# Patient Record
Sex: Male | Born: 1945 | Race: White | Hispanic: No | Smoking: Current some day smoker
Health system: Southern US, Community
[De-identification: ages and names within clinical notes are randomized; demographics above are authoritative.]

## PROBLEM LIST (undated history)

## (undated) DIAGNOSIS — E119 Type 2 diabetes mellitus without complications: Secondary | ICD-10-CM

## (undated) DIAGNOSIS — F1010 Alcohol abuse, uncomplicated: Secondary | ICD-10-CM

## (undated) DIAGNOSIS — F32A Depression, unspecified: Secondary | ICD-10-CM

## (undated) DIAGNOSIS — R0600 Dyspnea, unspecified: Secondary | ICD-10-CM

## (undated) DIAGNOSIS — F419 Anxiety disorder, unspecified: Secondary | ICD-10-CM

## (undated) DIAGNOSIS — C801 Malignant (primary) neoplasm, unspecified: Secondary | ICD-10-CM

## (undated) DIAGNOSIS — E785 Hyperlipidemia, unspecified: Secondary | ICD-10-CM

## (undated) DIAGNOSIS — F329 Major depressive disorder, single episode, unspecified: Secondary | ICD-10-CM

## (undated) DIAGNOSIS — I10 Essential (primary) hypertension: Secondary | ICD-10-CM

## (undated) DIAGNOSIS — C4490 Unspecified malignant neoplasm of skin, unspecified: Secondary | ICD-10-CM

## (undated) HISTORY — DX: Hyperlipidemia, unspecified: E78.5

## (undated) HISTORY — PX: CATARACT EXTRACTION: SUR2

## (undated) HISTORY — DX: Unspecified malignant neoplasm of skin, unspecified: C44.90

## (undated) HISTORY — DX: Essential (primary) hypertension: I10

## (undated) HISTORY — DX: Depression, unspecified: F32.A

## (undated) HISTORY — DX: Anxiety disorder, unspecified: F41.9

## (undated) HISTORY — DX: Major depressive disorder, single episode, unspecified: F32.9

## (undated) HISTORY — PX: COLONOSCOPY: SHX174

## (undated) NOTE — *Deleted (*Deleted)
[facility]  [address], [address] [place], [place] [address] Phone: [phone]  Fax: [phone]   Clinic Day:  10/26/2020  Referring physician: Etheleen Nicks, NP  Chief Complaint: Kenneth Franklin is a 41 y.o. male with stage III nasopharyngeal carcinoma who is seen for assessment prior to week #7 cisplatin and concurrent radiation.  HPI: The patient was last seen in the oncology clinic on 10/20/2020. At that time, he had been having a lot of mouth pain and burning on the right side. He could not taste anything. He was drinking 4-5 Boosts or Ensures and 5 bottles of water per day. He denied any problems with his hearing. Shortness of breath was stable. He had rare headaches. Hematocrit was 34.8, hemoglobin 11.5, MCV 91.3, platelets 215,000, WBC 5,200. Sodium was 132. Chloride was 95. Magnesium was 1.4. He was prescribed magic mouthwash. He received week #6 cisplatin. He received potassium chloride 20 mEq and 2 g IV magnesium.  The patient began IMRT treatment on 09/08/2020. His last treatment is scheduled for 10/28/2020.  The patient went to the ER on 10/23/2020 for trouble swallowing. He was able to swallow liquids but not solids. He stated that magic mouthwash was not providing him much relief. He was given IV magnesium. He was prescribed viscous lidocaine.  The patient was admitted to [facility] on 10/25/2020 for generalized weakness and poor oral intake.  During the interim, ***   Past Medical History:  Diagnosis Date  . Acute ischemic stroke (HCC) 2017  . Alcohol abuse   . Anxiety   . Cancer (HCC)   . Depression   . Diabetes mellitus without complication (HCC)   . Dyspnea    pcp knows and ordered rescue inhaler  . Hyperlipidemia   . Hypertension   . Skin cancer    Squamous Cell Carcinoma In Situ    Past Surgical History:  Procedure Laterality Date  . CATARACT EXTRACTION Bilateral   . COLONOSCOPY    . NASOPHARYNGOSCOPY N/A 08/12/2020    Procedure: ENDOSCOPIC NASOPHARYNGOSCOPY WITH BIOPSY;  Surgeon: Vernie Murders, MD;  Location: [facility] ORS;  Service: ENT;  Laterality: N/A;  . PORTA CATH INSERTION N/A 08/24/2020   Procedure: PORTA CATH INSERTION;  Surgeon: Annice Needy, MD;  Location: [facility] INVASIVE CV LAB;  Service: Cardiovascular;  Laterality: N/A;    Family History  Problem Relation Age of Onset  . Diabetes Brother     Social History:  reports that he quit smoking about 14 months ago. His smoking use included cigars and cigarettes. He has a 40.00 pack-year smoking history. He has never used smokeless tobacco. He reports previous alcohol use. He reports that he does not use drugs. He smoked 1 pack/day x 40 years.  He denies exposure of radiation or toxins; he did work with asphalt. He lives alone in [place]. He is widowed. The patient is alone*** today.  Allergies:  Allergies  Allergen Reactions  . Propoxyphene     Unknown reaction    Current Medications: No current facility-administered medications for this visit.   No current outpatient medications on file.   Facility-Administered Medications Ordered in Other Visits  Medication Dose Route Frequency Provider Last Rate Last Admin  . 0.9 %  sodium chloride infusion   Intravenous Continuous Andris Baumann, MD 75 mL/hr at 10/25/20 1634 New Bag at 10/25/20 1634  . acetaminophen (TYLENOL) tablet 650 mg  650 mg Oral Q6H PRN Andris Baumann, MD       Or  .  acetaminophen (TYLENOL) suppository 650 mg  650 mg Rectal Q6H PRN Andris Baumann, MD      . acetaminophen (TYLENOL) tablet 1,000 mg  1,000 mg Oral Once [place], [place], MD      . ALPRAZolam Prudy Feeler) tablet 0.25 mg  0.25 mg Oral Daily PRN Pokhrel, Laxman, MD   0.25 mg at 10/25/20 1258  . aspirin EC tablet 81 mg  81 mg Oral Daily Pokhrel, Laxman, MD      . atorvastatin (LIPITOR) tablet 80 mg  80 mg Oral QPM Pokhrel, Laxman, MD      . bisacodyl (DULCOLAX) suppository 10 mg  10 mg Rectal Daily Pokhrel, Laxman, MD   10 mg  at 10/25/20 1258  . budesonide (PULMICORT) nebulizer solution 0.25 mg  0.25 mg Nebulization BID Pokhrel, Laxman, MD   0.25 mg at 10/26/20 0725  . busPIRone (BUSPAR) tablet 10 mg  10 mg Oral QHS Pokhrel, Laxman, MD   10 mg at 10/25/20 2144  . calcium carbonate (TUMS - dosed in mg elemental calcium) chewable tablet 200 mg of elemental calcium  1 tablet Oral TID WC Pokhrel, Laxman, MD      . clopidogrel (PLAVIX) tablet 75 mg  75 mg Oral Daily Pokhrel, Laxman, MD      . enoxaparin (LOVENOX) injection 40 mg  40 mg Subcutaneous Q24H Lindajo Royal V, MD   40 mg at 10/25/20 1258  . escitalopram (LEXAPRO) tablet 20 mg  20 mg Oral q morning - 10a Pokhrel, Laxman, MD      . HYDROcodone-acetaminophen (NORCO) 10-325 MG per tablet 0.5 tablet  0.5 tablet Oral Q4H PRN Pokhrel, Laxman, MD      . insulin aspart (novoLOG) injection 0-5 Units  0-5 Units Subcutaneous QHS Lindajo Royal V, MD      . insulin aspart (novoLOG) injection 0-9 Units  0-9 Units Subcutaneous TID WC Lindajo Royal V, MD      . lidocaine (XYLOCAINE) 2 % viscous mouth solution 15 mL  15 mL Mouth/Throat Q4H PRN Pokhrel, Laxman, MD   15 mL at 10/25/20 2059  . linagliptin (TRADJENTA) tablet 5 mg  5 mg Oral Daily Pokhrel, Laxman, MD      . losartan (COZAAR) tablet 100 mg  100 mg Oral q morning - 10a Pokhrel, Laxman, MD      . magnesium oxide (MAG-OX) tablet 400 mg  400 mg Oral Daily Pokhrel, Laxman, MD      . magnesium sulfate IVPB 2 g 50 mL  2 g Intravenous Once Pokhrel, Laxman, MD      . mirtazapine (REMERON) tablet 15 mg  15 mg Oral QHS Pokhrel, Laxman, MD   15 mg at 10/25/20 2145  . morphine 2 MG/ML injection 2 mg  2 mg Intravenous Q2H PRN Andris Baumann, MD   2 mg at 10/25/20 1250  . ondansetron (ZOFRAN) tablet 4 mg  4 mg Oral Q6H PRN Andris Baumann, MD       Or  . ondansetron [facility]) injection 4 mg  4 mg Intravenous Q6H PRN Andris Baumann, MD   4 mg at 10/25/20 1631  . pantoprazole (PROTONIX) EC tablet 20 mg  20 mg Oral BID Pokhrel, Laxman,  MD   20 mg at 10/25/20 2145  . polyethylene glycol (MIRALAX / GLYCOLAX) packet 17 g  17 g Oral Daily Pokhrel, Laxman, MD      . prochlorperazine (COMPAZINE) tablet 10 mg  10 mg Oral Q6H PRN Pokhrel, Laxman, MD      .  sodium fluoride (PREVIDENT 5000 PLUS) 1.1 % dental cream 1 application  1 application dental QHS Pokhrel, Laxman, MD        Review of Systems  Constitutional: Positive for weight loss (2 lbs). Negative for chills, diaphoresis, fever and malaise/fatigue.       Feels "alright."  HENT: Negative for congestion, ear discharge, ear pain, hearing loss, nosebleeds, sinus pain, sore throat and tinnitus.        No sense of taste. Pain and burning on right side of mouth. No problems with hearing.  Eyes: Negative for blurred vision.  Respiratory: Positive for shortness of breath (on exertion, stable). Negative for cough, hemoptysis and sputum production.   Cardiovascular: Negative.  Negative for chest pain, palpitations and leg swelling.  Gastrointestinal: Negative for abdominal pain, blood in stool, constipation, diarrhea, heartburn, melena, nausea and vomiting.       Drinks Boost/Ensure 4-5 x per day. Drinks 5 bottles of water per day.  Genitourinary: Negative.  Negative for dysuria, frequency, hematuria and urgency.       Urinates regularly throughout the day.  Musculoskeletal: Negative.  Negative for back pain, joint pain, myalgias and neck pain.  Skin: Negative for itching and rash.  Neurological: Positive for headaches (rare). Negative for dizziness, tingling, sensory change and weakness.  Endo/Heme/Allergies: Negative.  Does not bruise/bleed easily.  Psychiatric/Behavioral: Negative.  Negative for depression and memory loss. The patient is not nervous/anxious and does not have insomnia.   All other systems reviewed and are negative.  Performance status (ECOG): 1***  Vitals There were no vitals taken for this visit.   Physical Exam Vitals and nursing note reviewed.   Constitutional:      General: He is not in acute distress.    Appearance: Normal appearance. He is well-developed.     Interventions: Face mask in place.     Comments: Gentleman sitting comfortably in wheelchair in no acute distress. Able to get onto table for exam.  HENT:     Head: Normocephalic and atraumatic.     Comments: Cap. Gray hair.    Right Ear: Hearing normal.     Left Ear: Hearing normal.     Mouth/Throat:     Mouth: Mucous membranes are dry. No oral lesions.     Comments: Pinkness on hard palate (improved). Eyes:     General: No scleral icterus.    Extraocular Movements: Extraocular movements intact.     Conjunctiva/sclera: Conjunctivae normal.     Pupils: Pupils are equal, round, and reactive to light.     Comments: Eye irritation has resolved.  Cardiovascular:     Rate and Rhythm: Normal rate and regular rhythm.     Heart sounds: Normal heart sounds. No murmur heard.  No friction rub. No gallop.   Pulmonary:     Effort: Pulmonary effort is normal.     Breath sounds: Normal breath sounds. No wheezing, rhonchi or rales.  Abdominal:     General: Bowel sounds are normal. There is no distension.     Palpations: Abdomen is soft. There is no hepatomegaly, splenomegaly or mass.     Tenderness: There is no abdominal tenderness. There is no guarding or rebound.  Musculoskeletal:        General: No tenderness. Normal range of motion.     Cervical back: Normal range of motion and neck supple.  Lymphadenopathy:     Head:     Right side of head: No preauricular, posterior auricular or occipital adenopathy.  Left side of head: No preauricular, posterior auricular or occipital adenopathy.     Cervical: No cervical adenopathy.     Upper Body:     Right upper body: No supraclavicular or axillary adenopathy.     Left upper body: No supraclavicular or axillary adenopathy.     Lower Body: No right inguinal adenopathy. No left inguinal adenopathy.  Skin:    General: Skin is  warm and dry.     Findings: No bruising, erythema, lesion or rash.     Comments: Ruddy erythema on neck s/p radiation.  Neurological:     Mental Status: He is alert and oriented to person, place, and time.  Psychiatric:        Behavior: Behavior normal.        Thought Content: Thought content normal.        Judgment: Judgment normal.    No visits with results within 3 Day(s) from this visit.  Latest known visit with results is:  Admission on 08/12/2020, Discharged on 08/12/2020  Component Date Value Ref Range Status  . Sodium 08/12/2020 132* 135 - 145 mmol/L Final  . Potassium 08/12/2020 4.2  3.5 - 5.1 mmol/L Final  . Chloride 08/12/2020 96* 98 - 111 mmol/L Final  . CO2 08/12/2020 27  22 - 32 mmol/L Final  . Glucose, Bld 08/12/2020 374* 70 - 99 mg/dL Final   Glucose reference range applies only to samples taken after fasting for at least 8 hours.  . BUN 08/12/2020 7* 8 - 23 mg/dL Final  . Creatinine, Ser 08/12/2020 0.84  0.61 - 1.24 mg/dL Final  . Calcium 40/98/1191 8.5* 8.9 - 10.3 mg/dL Final  . GFR calc non Af Amer 08/12/2020 >60  >60 mL/min Final  . GFR calc Af Amer 08/12/2020 >60  >60 mL/min Final  . Anion gap 08/12/2020 9  5 - 15 Final   Performed at [facility], [address] [place], [place] [address]  . WBC 08/12/2020 5.5  4.0 - 10.5 K/uL Final  . RBC 08/12/2020 4.46  4.22 - 5.81 MIL/uL Final  . Hemoglobin 08/12/2020 13.2  13.0 - 17.0 g/dL Final  . HCT 56/21/3086 39.3  39 - 52 % Final  . MCV 08/12/2020 88.1  80.0 - 100.0 fL Final  . MCH 08/12/2020 29.6  26.0 - 34.0 pg Final  . MCHC 08/12/2020 33.6  30.0 - 36.0 g/dL Final  . RDW 57/84/6962 12.6  11.5 - 15.5 % Final  . Platelets 08/12/2020 178  150 - 400 K/uL Final  . nRBC 08/12/2020 0.0  0.0 - 0.2 % Final   Performed at [facility], [address], [place], [place] [address]  . Glucose-Capillary 08/12/2020 368* 70 - 99 mg/dL Final   Glucose reference range applies only to samples taken after  fasting for at least 8 hours.  . Glucose-Capillary 08/12/2020 333* 70 - 99 mg/dL Final   Glucose reference range applies only to samples taken after fasting for at least 8 hours.  . SURGICAL PATHOLOGY 08/12/2020    Final-Edited                   Value:SURGICAL PATHOLOGY CASE: [id number] PATIENT: Ernestine Conrad Surgical Pathology Report  Specimen Submitted: A. Nasopharyngeal tumor  Clinical History: Malignant neoplasm nasopharynx, sphenoid sinus.  DIAGNOSIS: A. NASOPHARYNGEAL TUMOR; BIOPSY: - KERATINIZING SQUAMOUS CELL CARCINOMA, MODERATELY DIFFERENTIATED.  GROSS DESCRIPTION: Intraoperative Consultation:     Labeled: Nasopharyngeal tumor     Received: Fresh  Specimen: Nasopharyngoscopy with biopsy     Pathologic evaluation performed: Frozen section diagnosis     Diagnosis: FSA, representative section: Squamous cell carcinoma     Communicated to: Called to Dr. Elenore Rota at 11:36 AM on 08/12/2020 Elijah Birk M.D.     Tissue submitted: A touch preparation with 1 Diff-Quik stained slide is performed.  Additionally, representative sections of the specimen are frozen as FSA1 and 3 frozen section slides are performed.  A. Labeled: Nasopharyngeal tumor Received: Fresh Tissue fragment(s): Multiple                          Size: Aggregate, 1.4 x 1.4 x 0.4 cm Description: Received fresh on a Telfa pad are fragments of pink soft tissue and blood clot.  A touch preparation with 1 Diff-Quik stained slide is performed.  Representative sections are frozen as FSA1 with 3 frozen section slides performed.  The frozen section remnant is submitted in cassette 1 and the remainder of the specimen is submitted in cassette 2.  Final Diagnosis performed by Elijah Birk, MD.   Electronically signed 08/13/2020 9:25:55AM The electronic signature indicates that the named Attending Pathologist has evaluated the specimen Technical component performed at [facility], [address] [place], [place] [address]  Lab: [id number] Dir: Jolene Schimke, MD, MMM  Professional component performed at [facility], [address] [place], [place], [place] [address] Lab: [phone] Dir: Georgiann Cocker. Rubinas, MD  . Glucose-Capillary 08/12/2020 275* 70 - 99 mg/dL Final   Glucose reference range applies only to samples taken after fasting for at least 8 hours.  . Glucose-Capillary 08/12/2020 246* 70 - 99 mg/dL Final   Glucose reference range applies only to samples taken after fasting for at least 8 hours.    Assessment:  AADYN BUCHHEIT is a 74 y.o. 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  Pathology revealed keratinizing squamous cell carcinoma, moderately differentiated.   Head MRI on 08/13/2020 revealed a 5.9 cm nasopharyngeal mass consistent with known nasopharyngeal carcinoma with involvement of the clivus and sphenoid sinus. There was moderate chronic small vessel ischemic disease and cerebral atrophy.  There was no intracranial extension.  PET scan on 08/18/2020 revealed a 5.7 x 4.2 cm destructive hypermetabolic mass (SUV 11.38) involving the sphenoid sinus, skull base and posterior nasopharynx.  This was consistent with patient's known squamous cell carcinoma. There was no locoregional lymphadenopathy or distant metastatic disease.  He began IMRT on 09/08/2020.  He is s/p 4 weeks of cisplatin (09/08/2020 - 09/29/2020; 10/13/2020).  He did not receive treatment on 10/06/2020 secondary to poor urine output.  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  Symptomatically, ***  Plan: 1.   Labs today: CBC with diff, CMP, Mg   2.   Clinical [id number] nasopharyngeal carcinoma  Tumor is unresectable.  PET scan revealed no evidence of distant disease  Treatment includes upfront radiation and chemotherapy followed by adjuvant chemotherapy   Cisplatin  40 mg/m2 weekly x 6-7 with radiation followed by cisplatin 80 mg/m2 D1 + 5FU 1000 mg/m2/day CI D1-4 q 28 days x 3 cycles  Baseline audiogram confirmed hearing loss associated with prior exposure.  Symptomatically, he has mucositis secondary to radiation.   He denies any nausea.  He is voiding well.  He is s/p 5 weeks of cisplatin with radiation.   Radiation ends on 10/28/2020  Labs reviewed.  Begin week #6 cisplatin and radiation  Discuss symptom management.  He has antiemetics and pain medications at home to use on a prn bases.  Interventions are adequate.    3.   Mucositis  Exam reveals mucositis involving hard palate.  Pain controlled with hydrocodone with Tylenol.   Refill hydrocodone-acetaminophen (Norco) 10-325 mg 1/2 tablet po q 4 hours as needed for pain.  Patient notes no relief with Carafate.  Rx: Magic mouthwash. 4.   Hypomagnesemia  Magnesium 1.4.  Magnesium 2 gm IV. 5.   Week #6 cisplatin today. 6.   Assess calcium content in Ensure/Boost. 7.   RTC in 1 week for MD assessment, labs (CBC with diff, CMP, Mg), and week #7 cisplatin.  I discussed the assessment and treatment plan with the patient.  The patient was provided an opportunity to ask questions and all were answered.  The patient agreed with the plan and demonstrated an understanding of the instructions.  The patient was advised to call back if the symptoms worsen or if the condition fails to improve as anticipated.  I provided *** minutes of face-to-face time during this this encounter and > 50% was spent counseling as documented under my assessment and plan.  Carman Essick C. Merlene Pulling, MD, PhD    10/26/2020, 8:50 AM  I, Danella Penton Tufford, am acting as Neurosurgeon for [employer]. Merlene Pulling, MD, PhD.  I, Mckale Haffey C. Merlene Pulling, MD, have reviewed the above documentation for accuracy and completeness, and I agree with the above.

---

## 1999-07-06 ENCOUNTER — Ambulatory Visit (HOSPITAL_COMMUNITY): Admission: RE | Admit: 1999-07-06 | Discharge: 1999-07-06 | Payer: Self-pay | Admitting: Neurological Surgery

## 1999-07-06 ENCOUNTER — Encounter: Payer: Self-pay | Admitting: Neurological Surgery

## 2006-12-26 ENCOUNTER — Ambulatory Visit: Payer: Self-pay | Admitting: Unknown Physician Specialty

## 2009-11-28 DEATH — deceased

## 2011-08-22 ENCOUNTER — Ambulatory Visit: Payer: Self-pay

## 2012-01-26 ENCOUNTER — Ambulatory Visit: Payer: Self-pay | Admitting: Orthopedic Surgery

## 2012-01-28 LAB — PATHOLOGY REPORT

## 2013-05-15 ENCOUNTER — Ambulatory Visit: Payer: Self-pay | Admitting: Family Medicine

## 2014-01-06 DIAGNOSIS — F341 Dysthymic disorder: Secondary | ICD-10-CM | POA: Diagnosis not present

## 2014-03-10 DIAGNOSIS — E7800 Pure hypercholesterolemia, unspecified: Secondary | ICD-10-CM | POA: Diagnosis not present

## 2014-03-10 DIAGNOSIS — E119 Type 2 diabetes mellitus without complications: Secondary | ICD-10-CM | POA: Diagnosis not present

## 2014-03-10 DIAGNOSIS — I10 Essential (primary) hypertension: Secondary | ICD-10-CM | POA: Diagnosis not present

## 2014-03-10 DIAGNOSIS — F341 Dysthymic disorder: Secondary | ICD-10-CM | POA: Diagnosis not present

## 2014-03-20 DIAGNOSIS — E119 Type 2 diabetes mellitus without complications: Secondary | ICD-10-CM | POA: Diagnosis not present

## 2014-03-20 DIAGNOSIS — F341 Dysthymic disorder: Secondary | ICD-10-CM | POA: Diagnosis not present

## 2014-03-20 DIAGNOSIS — F4320 Adjustment disorder, unspecified: Secondary | ICD-10-CM | POA: Diagnosis not present

## 2014-10-16 DIAGNOSIS — I10 Essential (primary) hypertension: Secondary | ICD-10-CM | POA: Diagnosis not present

## 2014-10-16 DIAGNOSIS — E780 Pure hypercholesterolemia: Secondary | ICD-10-CM | POA: Diagnosis not present

## 2014-10-16 DIAGNOSIS — F418 Other specified anxiety disorders: Secondary | ICD-10-CM | POA: Diagnosis not present

## 2015-01-13 ENCOUNTER — Ambulatory Visit: Payer: Self-pay | Admitting: Family Medicine

## 2015-01-13 DIAGNOSIS — I10 Essential (primary) hypertension: Secondary | ICD-10-CM | POA: Diagnosis not present

## 2015-01-13 DIAGNOSIS — R918 Other nonspecific abnormal finding of lung field: Secondary | ICD-10-CM | POA: Diagnosis not present

## 2015-01-13 DIAGNOSIS — F17200 Nicotine dependence, unspecified, uncomplicated: Secondary | ICD-10-CM | POA: Diagnosis not present

## 2015-01-16 DIAGNOSIS — F418 Other specified anxiety disorders: Secondary | ICD-10-CM | POA: Diagnosis not present

## 2015-01-16 DIAGNOSIS — Z125 Encounter for screening for malignant neoplasm of prostate: Secondary | ICD-10-CM | POA: Diagnosis not present

## 2015-01-16 DIAGNOSIS — H9190 Unspecified hearing loss, unspecified ear: Secondary | ICD-10-CM | POA: Diagnosis not present

## 2015-01-16 DIAGNOSIS — E785 Hyperlipidemia, unspecified: Secondary | ICD-10-CM | POA: Diagnosis not present

## 2015-01-16 DIAGNOSIS — E119 Type 2 diabetes mellitus without complications: Secondary | ICD-10-CM | POA: Diagnosis not present

## 2015-01-16 DIAGNOSIS — K219 Gastro-esophageal reflux disease without esophagitis: Secondary | ICD-10-CM | POA: Diagnosis not present

## 2015-01-16 LAB — LIPID PANEL
Cholesterol: 172 mg/dL (ref 0–200)
HDL: 52 mg/dL (ref 35–70)
LDL Cholesterol: 107 mg/dL
Triglycerides: 66 mg/dL (ref 40–160)

## 2015-01-16 LAB — TSH: TSH: 2.26 u[IU]/mL (ref <=5.90)

## 2015-01-16 LAB — HEMOGLOBIN A1C: Hgb A1c MFr Bld: 6.2 % — AB (ref 4.0–6.0)

## 2015-01-30 DIAGNOSIS — E785 Hyperlipidemia, unspecified: Secondary | ICD-10-CM | POA: Diagnosis not present

## 2015-01-30 DIAGNOSIS — J029 Acute pharyngitis, unspecified: Secondary | ICD-10-CM | POA: Diagnosis not present

## 2015-01-30 DIAGNOSIS — I10 Essential (primary) hypertension: Secondary | ICD-10-CM | POA: Diagnosis not present

## 2015-01-30 DIAGNOSIS — F418 Other specified anxiety disorders: Secondary | ICD-10-CM | POA: Diagnosis not present

## 2015-03-22 NOTE — Op Note (Signed)
PATIENT NAME:  Kenneth Franklin, Kenneth Franklin MR#:  [id number] DATE OF BIRTH:  [birth year]  DATE OF PROCEDURE:  01/26/2012  PREOPERATIVE DIAGNOSIS: Mucous cyst, right index finger.   POSTOPERATIVE DIAGNOSIS: Mucous cyst, right index finger.   PROCEDURE PERFORMED: Excision mucous cyst, right index finger from the DIP joint   SURGEON: Laurene Footman, MD  ANESTHESIA: MAC with digital block.    DESCRIPTION OF PROCEDURE: Patient brought to the Operating Room and after adequate anesthesia was obtained, the right arm was prepped and draped in usual sterile fashion. After appropriate patient identification and timeout procedure were obtained, the base of the digit was infiltrated with a total of 20 mL of 0.5% Sensorcaine without epinephrine. After allowing this to set, a Penrose drain was placed around the base of the finger. The mucous cyst was elliptically excised. It was on the radial side of the index finger DIP joint dorsally. The incision extended slightly proximally to allow for a rotational flap that will allow for healing. The tendon was elevated and the DIP joint was exposed. There was some spur present and this was debrided using a small rongeur to try to prevent recurrence. At this point, the wound was thoroughly irrigated. The incision was closed with simple interrupted 5-0 nylon with complete closure obtained. The wound was then dressed with Xeroform, 2 x 2's and a finger roll. Patient was then sent to recovery room in stable condition. There were no complications.   SPECIMEN: Excised mucous cyst.   ESTIMATED BLOOD LOSS: Minimal.   ____________________________ Laurene Footman, MD mjm:cms D: 01/26/2012 16:16:00 ET T: 01/26/2012 16:57:58 ET JOB#: [id number] cc: Laurene Footman, MD, <Dictator> Laurene Footman MD ELECTRONICALLY SIGNED 01/26/2012 17:24

## 2015-04-02 DIAGNOSIS — F418 Other specified anxiety disorders: Secondary | ICD-10-CM | POA: Diagnosis not present

## 2015-04-21 DIAGNOSIS — K529 Noninfective gastroenteritis and colitis, unspecified: Secondary | ICD-10-CM | POA: Insufficient documentation

## 2015-04-21 DIAGNOSIS — I10 Essential (primary) hypertension: Secondary | ICD-10-CM | POA: Insufficient documentation

## 2015-04-21 DIAGNOSIS — M549 Dorsalgia, unspecified: Secondary | ICD-10-CM | POA: Insufficient documentation

## 2015-04-21 DIAGNOSIS — E785 Hyperlipidemia, unspecified: Secondary | ICD-10-CM | POA: Insufficient documentation

## 2015-04-21 DIAGNOSIS — F329 Major depressive disorder, single episode, unspecified: Secondary | ICD-10-CM | POA: Insufficient documentation

## 2015-04-21 DIAGNOSIS — E119 Type 2 diabetes mellitus without complications: Secondary | ICD-10-CM | POA: Insufficient documentation

## 2015-04-21 DIAGNOSIS — K219 Gastro-esophageal reflux disease without esophagitis: Secondary | ICD-10-CM | POA: Insufficient documentation

## 2015-04-21 DIAGNOSIS — F419 Anxiety disorder, unspecified: Secondary | ICD-10-CM

## 2015-04-30 ENCOUNTER — Ambulatory Visit: Payer: Medicare Other | Admitting: Family Medicine

## 2015-08-11 ENCOUNTER — Other Ambulatory Visit: Payer: Self-pay | Admitting: Family Medicine

## 2015-08-11 MED ORDER — LOSARTAN POTASSIUM 100 MG PO TABS: 100.0000 mg | ORAL_TABLET | Freq: Every day | ORAL | Status: DC

## 2015-08-11 MED ORDER — ESCITALOPRAM OXALATE 20 MG PO TABS: 20.0000 mg | ORAL_TABLET | Freq: Every day | ORAL | Status: DC

## 2015-08-11 MED ORDER — BUSPIRONE HCL 10 MG PO TABS: 10.0000 mg | ORAL_TABLET | Freq: Three times a day (TID) | ORAL | Status: DC

## 2015-08-11 MED ORDER — METFORMIN HCL 500 MG PO TABS: 500.0000 mg | ORAL_TABLET | Freq: Two times a day (BID) | ORAL | Status: DC

## 2015-08-11 MED ORDER — SIMVASTATIN 40 MG PO TABS: 40.0000 mg | ORAL_TABLET | Freq: Every day | ORAL | Status: DC

## 2015-08-11 MED ORDER — ALPRAZOLAM 0.25 MG PO TABS: 0.2500 mg | ORAL_TABLET | Freq: Two times a day (BID) | ORAL | Status: DC | PRN

## 2015-08-11 NOTE — Telephone Encounter (Signed)
Medciation has been refilled and sent to [facility]

## 2015-08-11 NOTE — Telephone Encounter (Signed)
Prescription is ready for pickup at office informed patient to bring photo ID

## 2015-08-12 ENCOUNTER — Ambulatory Visit: Payer: Medicare Other | Admitting: Family Medicine

## 2015-08-26 ENCOUNTER — Ambulatory Visit: Payer: Medicare Other | Admitting: Family Medicine

## 2015-09-10 ENCOUNTER — Other Ambulatory Visit: Payer: Self-pay | Admitting: Family Medicine

## 2015-09-14 ENCOUNTER — Ambulatory Visit (INDEPENDENT_AMBULATORY_CARE_PROVIDER_SITE_OTHER): Payer: BLUE CROSS/BLUE SHIELD | Admitting: Family Medicine

## 2015-09-14 ENCOUNTER — Encounter: Payer: Self-pay | Admitting: Family Medicine

## 2015-09-14 VITALS — BP 138/78 | HR 108 | Temp 98.1°F | Resp 20 | Ht 71.0 in | Wt 159.8 lb

## 2015-09-14 DIAGNOSIS — R0989 Other specified symptoms and signs involving the circulatory and respiratory systems: Secondary | ICD-10-CM

## 2015-09-14 DIAGNOSIS — E119 Type 2 diabetes mellitus without complications: Secondary | ICD-10-CM

## 2015-09-14 DIAGNOSIS — E785 Hyperlipidemia, unspecified: Secondary | ICD-10-CM | POA: Diagnosis not present

## 2015-09-14 DIAGNOSIS — F418 Other specified anxiety disorders: Secondary | ICD-10-CM | POA: Diagnosis not present

## 2015-09-14 DIAGNOSIS — F419 Anxiety disorder, unspecified: Secondary | ICD-10-CM

## 2015-09-14 DIAGNOSIS — F329 Major depressive disorder, single episode, unspecified: Secondary | ICD-10-CM

## 2015-09-14 DIAGNOSIS — I10 Essential (primary) hypertension: Secondary | ICD-10-CM | POA: Diagnosis not present

## 2015-09-14 DIAGNOSIS — F32A Depression, unspecified: Secondary | ICD-10-CM

## 2015-09-14 LAB — GLUCOSE, POCT (MANUAL RESULT ENTRY): POC GLUCOSE: 125 mg/dL — AB (ref 70–99)

## 2015-09-14 LAB — POCT GLYCOSYLATED HEMOGLOBIN (HGB A1C): Hemoglobin A1C: 5.6

## 2015-09-14 MED ORDER — SIMVASTATIN 40 MG PO TABS: 40.0000 mg | ORAL_TABLET | Freq: Every day | ORAL | Status: DC

## 2015-09-14 MED ORDER — ESCITALOPRAM OXALATE 20 MG PO TABS: 20.0000 mg | ORAL_TABLET | Freq: Every day | ORAL | Status: DC

## 2015-09-14 MED ORDER — METFORMIN HCL 500 MG PO TABS: 500.0000 mg | ORAL_TABLET | Freq: Every day | ORAL | Status: DC

## 2015-09-14 MED ORDER — BUSPIRONE HCL 10 MG PO TABS: 10.0000 mg | ORAL_TABLET | Freq: Two times a day (BID) | ORAL | Status: DC

## 2015-09-14 MED ORDER — LOSARTAN POTASSIUM 100 MG PO TABS: 100.0000 mg | ORAL_TABLET | Freq: Every day | ORAL | Status: DC

## 2015-09-14 NOTE — Progress Notes (Signed)
Name: Kenneth Franklin   MRN: [id number]    DOB: 04-Apr-1946   Date:09/14/2015       Progress Note  Subjective  Chief Complaint  Chief Complaint  Patient presents with  . Medication Refill  . Diabetes  . Hyperlipidemia  . Gastroesophageal Reflux    Diabetes He presents for his follow-up diabetic visit. He has type 2 diabetes mellitus. Pertinent negatives for hypoglycemia include no headaches. Pertinent negatives for diabetes include no chest pain. Pertinent negatives for diabetic complications include no CVA. Current diabetic treatment includes oral agent (monotherapy). (Does not check BG) An ACE inhibitor/angiotensin II receptor blocker is being taken. Eye exam is not current.  Hyperlipidemia This is a chronic problem. The problem is controlled. Pertinent negatives include no chest pain, leg pain, myalgias or shortness of breath. Current antihyperlipidemic treatment includes statins.  Anxiety Presents for follow-up visit. Symptoms include depressed mood, excessive worry, irritability and restlessness. Patient reports no chest pain, insomnia, palpitations or shortness of breath.   Past treatments include non-benzodiazephine anxiolytics and benzodiazephines.  Depression        This is a chronic problem.  Associated symptoms include irritable, restlessness, body aches and sad.  Associated symptoms include does not have insomnia, no myalgias and no headaches.     The symptoms are aggravated by family issues.  Past treatments include SSRIs - Selective serotonin reuptake inhibitors.  Past medical history includes anxiety.   Hypertension This is a chronic problem. The problem is controlled. Associated symptoms include anxiety. Pertinent negatives include no chest pain, headaches, malaise/fatigue, palpitations or shortness of breath. Past treatments include angiotensin blockers. There is no history of kidney disease, CAD/MI or CVA.   Past Medical History  Diagnosis Date  . Anxiety   .  Hyperlipidemia   . Hypertension   . Depression     Past Surgical History  Procedure Laterality Date  . Cataract extraction Bilateral     Family History  Problem Relation Age of Onset  . Diabetes Brother     Social History   Social History  . Marital Status: Single    Spouse Name: N/A  . Number of Children: N/A  . Years of Education: N/A   Occupational History  . Not on file.   Social History Main Topics  . Smoking status: Current Some Day Smoker    Types: Cigars  . Smokeless tobacco: Never Used  . Alcohol Use: No  . Drug Use: No  . Sexual Activity: No   Other Topics Concern  . Not on file   Social History Narrative    Current outpatient prescriptions:  .  ALPRAZolam (XANAX) 0.25 MG tablet, Take 1 tablet (0.25 mg total) by mouth 2 (two) times daily as needed for anxiety., Disp: 60 tablet, Rfl: 0 .  aspirin 81 MG tablet, Take 1 tablet by mouth daily., Disp: , Rfl:  .  BLOOD GLUCOSE MONITORING SUPPL, ONETOUCH ULTRA (Device) - Historical Medication Active, Disp: , Rfl:  .  busPIRone (BUSPAR) 10 MG tablet, Take 1 tablet (10 mg total) by mouth 3 (three) times daily., Disp: 90 tablet, Rfl: 0 .  Cholecalciferol 1000 UNITS capsule, Take by mouth., Disp: , Rfl:  .  escitalopram (LEXAPRO) 20 MG tablet, Take 1 tablet (20 mg total) by mouth daily., Disp: 30 tablet, Rfl: 0 .  losartan (COZAAR) 100 MG tablet, Take 1 tablet (100 mg total) by mouth daily., Disp: 30 tablet, Rfl: 0 .  metFORMIN (GLUCOPHAGE) 500 MG tablet, Take 1 tablet (500 mg total)  by mouth 2 (two) times daily with a meal., Disp: 60 tablet, Rfl: 0 .  ranitidine (ZANTAC) 300 MG tablet, Take by mouth., Disp: , Rfl:  .  simvastatin (ZOCOR) 40 MG tablet, Take 1 tablet (40 mg total) by mouth at bedtime., Disp: 30 tablet, Rfl: 0  Allergies  Allergen Reactions  . Propoxyphene    Review of Systems  Constitutional: Positive for irritability. Negative for malaise/fatigue.  Respiratory: Negative for shortness of breath.    Cardiovascular: Negative for chest pain and palpitations.  Musculoskeletal: Negative for myalgias.  Neurological: Negative for headaches.  Psychiatric/Behavioral: Positive for depression. The patient does not have insomnia.    Objective  Filed Vitals:   09/14/15 0815  BP: 138/78  Pulse: 108  Temp: 98.1 F (36.7 C)  TempSrc: Oral  Resp: 20  Height: 5\' 11"  (1.803 m)  Weight: 159 lb 12.8 oz (72.485 kg)  SpO2: 95%    Physical Exam  Constitutional: He is oriented to person, place, and time and well-developed, well-nourished, and in no distress. He is irritable.  HENT:  Head: Normocephalic and atraumatic.  Cardiovascular: Normal rate and regular rhythm.   No murmur heard. Pulmonary/Chest: Effort normal and breath sounds normal. He has no wheezes.  Abdominal: Soft. Bowel sounds are normal. There is no tenderness.  Neurological: He is alert and oriented to person, place, and time.  Psychiatric: Mood, memory, affect and judgment normal.  Nursing note and vitals reviewed.  Assessment & Plan  1. Type 2 diabetes mellitus without complication, without long-term current use of insulin (HCC) Change metformin from twice daily to once daily. A1c of 5.6%, consistent with well-controlled diabetes. Recheck in 3-4 months - metFORMIN (GLUCOPHAGE) 500 MG tablet; Take 1 tablet (500 mg total) by mouth daily with breakfast.  Dispense: 90 tablet; Refill: 0 - POCT HgB A1C - POCT Glucose (CBG)  2. Anxiety and depression Symptoms stable on present therapy. Patient takes alprazolam 0.25 mg twice daily as needed. - busPIRone (BUSPAR) 10 MG tablet; Take 1 tablet (10 mg total) by mouth 2 (two) times daily.  Dispense: 180 tablet; Refill: 0 - escitalopram (LEXAPRO) 20 MG tablet; Take 1 tablet (20 mg total) by mouth daily.  Dispense: 90 tablet; Refill: 0  3. Dyslipidemia  - Lipid Profile - Comprehensive Metabolic Panel (CMET) - simvastatin (ZOCOR) 40 MG tablet; Take 1 tablet (40 mg total) by mouth at  bedtime.  Dispense: 90 tablet; Refill: 0  4. Essential hypertension  - losartan (COZAAR) 100 MG tablet; Take 1 tablet (100 mg total) by mouth daily.  Dispense: 90 tablet; Refill: 0  5. Absent pulse in lower extremity Could not palpate dorsalis pedis and posterior tibial pulses during diabetic foot exam. Patient to be referred to vascular surgery. - Ambulatory referral to Vascular Surgery   Nakai Pollio [facility] 09/14/2015 8:26 AM

## 2015-11-29 DIAGNOSIS — I639 Cerebral infarction, unspecified: Secondary | ICD-10-CM

## 2015-11-29 HISTORY — DX: Cerebral infarction, unspecified: I63.9

## 2015-12-15 ENCOUNTER — Encounter: Payer: Self-pay | Admitting: Family Medicine

## 2015-12-15 ENCOUNTER — Ambulatory Visit (INDEPENDENT_AMBULATORY_CARE_PROVIDER_SITE_OTHER): Payer: BLUE CROSS/BLUE SHIELD | Admitting: Family Medicine

## 2015-12-15 VITALS — BP 136/80 | HR 96 | Temp 98.4°F | Resp 18 | Ht 71.0 in | Wt 161.9 lb

## 2015-12-15 DIAGNOSIS — F419 Anxiety disorder, unspecified: Principal | ICD-10-CM

## 2015-12-15 DIAGNOSIS — I10 Essential (primary) hypertension: Secondary | ICD-10-CM

## 2015-12-15 DIAGNOSIS — F418 Other specified anxiety disorders: Secondary | ICD-10-CM | POA: Diagnosis not present

## 2015-12-15 DIAGNOSIS — E785 Hyperlipidemia, unspecified: Secondary | ICD-10-CM | POA: Diagnosis not present

## 2015-12-15 DIAGNOSIS — E119 Type 2 diabetes mellitus without complications: Secondary | ICD-10-CM

## 2015-12-15 DIAGNOSIS — F329 Major depressive disorder, single episode, unspecified: Secondary | ICD-10-CM

## 2015-12-15 MED ORDER — BUSPIRONE HCL 10 MG PO TABS
10.0000 mg | ORAL_TABLET | Freq: Two times a day (BID) | ORAL | Status: DC
Start: 2015-12-15 — End: 2016-05-18

## 2015-12-15 MED ORDER — LOSARTAN POTASSIUM 100 MG PO TABS: 100.0000 mg | ORAL_TABLET | Freq: Every day | ORAL | Status: DC

## 2015-12-15 MED ORDER — METFORMIN HCL 500 MG PO TABS
500.0000 mg | ORAL_TABLET | Freq: Every day | ORAL | Status: DC
Start: 2015-12-15 — End: 2016-05-18

## 2015-12-15 MED ORDER — SIMVASTATIN 40 MG PO TABS: 40.0000 mg | ORAL_TABLET | Freq: Every day | ORAL | Status: DC

## 2015-12-15 MED ORDER — ESCITALOPRAM OXALATE 20 MG PO TABS: 20.0000 mg | ORAL_TABLET | Freq: Every day | ORAL | Status: DC

## 2015-12-15 MED ORDER — ALPRAZOLAM 0.25 MG PO TABS: 0.2500 mg | ORAL_TABLET | Freq: Every day | ORAL | Status: DC | PRN

## 2015-12-15 NOTE — Progress Notes (Signed)
Name: Kenneth Franklin   MRN: [id number]    DOB: Oct 21, 1946   Date:12/15/2015       Progress Note  Subjective  Chief Complaint  Chief Complaint  Patient presents with  . Follow-up    3 mo  . Hyperlipidemia  . Diabetes  . Gastroesophageal Reflux    Hyperlipidemia This is a chronic problem. The problem is controlled. Recent lipid tests were reviewed and are high (Elevated LDL). Pertinent negatives include no chest pain, leg pain, myalgias or shortness of breath. Current antihyperlipidemic treatment includes statins.  Diabetes He presents for his follow-up diabetic visit. He has type 2 diabetes mellitus. His disease course has been stable. Hypoglycemia symptoms include nervousness/anxiousness. Pertinent negatives for hypoglycemia include no headaches. Associated symptoms include fatigue. Pertinent negatives for diabetes include no chest pain, no foot paresthesias, no polydipsia and no polyuria. Symptoms are stable. Pertinent negatives for diabetic complications include no CVA or heart disease. Risk factors for coronary artery disease include dyslipidemia, male sex, hypertension, diabetes mellitus and stress. Current diabetic treatment includes oral agent (monotherapy). Frequency home blood tests: does not check his Blood Glucose at home. An ACE inhibitor/angiotensin II receptor blocker is being taken. Eye exam is not current.  Hypertension This is a chronic problem. The problem is unchanged. The problem is controlled. Associated symptoms include anxiety. Pertinent negatives include no chest pain, headaches, palpitations or shortness of breath. Risk factors for coronary artery disease include dyslipidemia, diabetes mellitus, male gender and stress. Past treatments include angiotensin blockers. There is no history of kidney disease, CAD/MI or CVA.  Anxiety Presents for follow-up visit. Symptoms include depressed mood, excessive worry, insomnia, irritability, malaise and nervous/anxious behavior.  Patient reports no chest pain, palpitations or shortness of breath. The severity of symptoms is moderate. The symptoms are aggravated by family issues.   His past medical history is significant for anxiety/panic attacks and depression. Past treatments include benzodiazephines, SSRIs and non-benzodiazephine anxiolytics. Compliance with prior treatments has been good.  Depression      The patient presents with depression.  This is a chronic problem.  The onset quality is gradual. The problem is unchanged.  Associated symptoms include fatigue, hopelessness, insomnia and decreased interest.  Associated symptoms include not irritable, no myalgias and no headaches.     The symptoms are aggravated by family issues and work stress.  Past treatments include SSRIs - Selective serotonin reuptake inhibitors.  Compliance with treatment is good.  Risk factors include stress.   Past medical history includes anxiety and depression.     Past Medical History  Diagnosis Date  . Anxiety   . Hyperlipidemia   . Hypertension   . Depression     Past Surgical History  Procedure Laterality Date  . Cataract extraction Bilateral     Family History  Problem Relation Age of Onset  . Diabetes Brother     Social History   Social History  . Marital Status: Single    Spouse Name: N/A  . Number of Children: N/A  . Years of Education: N/A   Occupational History  . Not on file.   Social History Main Topics  . Smoking status: Current Some Day Smoker    Types: Cigars  . Smokeless tobacco: Never Used  . Alcohol Use: No  . Drug Use: No  . Sexual Activity: No   Other Topics Concern  . Not on file   Social History Narrative     Current outpatient prescriptions:  .  ALPRAZolam (XANAX) 0.25 MG  tablet, Take 1 tablet (0.25 mg total) by mouth 2 (two) times daily as needed for anxiety., Disp: 60 tablet, Rfl: 0 .  aspirin 81 MG tablet, Take 1 tablet by mouth daily., Disp: , Rfl:  .  BLOOD GLUCOSE MONITORING  SUPPL, ONETOUCH ULTRA (Device) - Historical Medication Active, Disp: , Rfl:  .  busPIRone (BUSPAR) 10 MG tablet, Take 1 tablet (10 mg total) by mouth 2 (two) times daily., Disp: 180 tablet, Rfl: 0 .  Cholecalciferol 1000 UNITS capsule, Take by mouth., Disp: , Rfl:  .  escitalopram (LEXAPRO) 20 MG tablet, Take 1 tablet (20 mg total) by mouth daily., Disp: 90 tablet, Rfl: 0 .  losartan (COZAAR) 100 MG tablet, Take 1 tablet (100 mg total) by mouth daily., Disp: 90 tablet, Rfl: 0 .  metFORMIN (GLUCOPHAGE) 500 MG tablet, Take 1 tablet (500 mg total) by mouth daily with breakfast., Disp: 90 tablet, Rfl: 0 .  ranitidine (ZANTAC) 300 MG tablet, Take by mouth., Disp: , Rfl:  .  simvastatin (ZOCOR) 40 MG tablet, Take 1 tablet (40 mg total) by mouth at bedtime., Disp: 90 tablet, Rfl: 0  Allergies  Allergen Reactions  . Propoxyphene      Review of Systems  Constitutional: Positive for irritability and fatigue.  Respiratory: Negative for shortness of breath.   Cardiovascular: Negative for chest pain and palpitations.  Musculoskeletal: Negative for myalgias.  Neurological: Negative for headaches.  Endo/Heme/Allergies: Negative for polydipsia.  Psychiatric/Behavioral: Positive for depression. The patient is nervous/anxious and has insomnia.     Objective  Filed Vitals:   12/15/15 1005  BP: 136/80  Pulse: 96  Temp: 98.4 F (36.9 C)  TempSrc: Oral  Resp: 18  Height: 5\' 11"  (1.803 m)  Weight: 161 lb 14.4 oz (73.437 kg)  SpO2: 97%    Physical Exam  Constitutional: He is oriented to person, place, and time and well-developed, well-nourished, and in no distress. He is not irritable.  HENT:  Head: Normocephalic and atraumatic.  Cardiovascular: Normal rate and regular rhythm.   No murmur heard. Pulmonary/Chest: Effort normal and breath sounds normal. He has no wheezes.  Abdominal: Soft. Bowel sounds are normal. There is no tenderness.  Neurological: He is alert and oriented to person, place,  and time.  Psychiatric: Memory, affect and judgment normal. His mood appears anxious.  Nursing note and vitals reviewed.      Assessment & Plan  1. Anxiety and depression  - busPIRone (BUSPAR) 10 MG tablet; Take 1 tablet (10 mg total) by mouth 2 (two) times daily.  Dispense: 180 tablet; Refill: 0 - escitalopram (LEXAPRO) 20 MG tablet; Take 1 tablet (20 mg total) by mouth daily.  Dispense: 90 tablet; Refill: 0 - ALPRAZolam (XANAX) 0.25 MG tablet; Take 1 tablet (0.25 mg total) by mouth daily as needed for anxiety.  Dispense: 30 tablet; Refill: 0  2. Essential hypertension  - losartan (COZAAR) 100 MG tablet; Take 1 tablet (100 mg total) by mouth daily.  Dispense: 90 tablet; Refill: 0  3. Dyslipidemia  - simvastatin (ZOCOR) 40 MG tablet; Take 1 tablet (40 mg total) by mouth at bedtime.  Dispense: 90 tablet; Refill: 0 - Lipid Profile - Comprehensive Metabolic Panel (CMET)  4. Type 2 diabetes mellitus without complication, without long-term current use of insulin (HCC)  - metFORMIN (GLUCOPHAGE) 500 MG tablet; Take 1 tablet (500 mg total) by mouth daily with breakfast.  Dispense: 90 tablet; Refill: 0 - Urine Microalbumin w/creat. ratio - Ambulatory referral to Ophthalmology   Dossie Der  [facility] 12/15/2015 10:11 AM

## 2015-12-16 LAB — LIPID PANEL
Chol/HDL Ratio: 3.5 ratio units (ref 0.0–5.0)
Cholesterol, Total: 149 mg/dL (ref 100–199)
HDL: 43 mg/dL (ref >39)
LDL CALC: 88 mg/dL (ref 0–99)
Triglycerides: 92 mg/dL (ref 0–149)
VLDL CHOLESTEROL CAL: 18 mg/dL (ref 5–40)

## 2015-12-16 LAB — COMPREHENSIVE METABOLIC PANEL
ALK PHOS: 76 IU/L (ref 39–117)
ALT: 11 IU/L (ref 0–44)
AST: 15 IU/L (ref 0–40)
Albumin/Globulin Ratio: 1.8 (ref 1.1–2.5)
Albumin: 4.2 g/dL (ref 3.6–4.8)
BUN / CREAT RATIO: 7 — AB (ref 10–22)
BUN: 8 mg/dL (ref 8–27)
Bilirubin Total: 0.5 mg/dL (ref 0.0–1.2)
CALCIUM: 9.4 mg/dL (ref 8.6–10.2)
CO2: 26 mmol/L (ref 18–29)
CREATININE: 1.09 mg/dL (ref 0.76–1.27)
Chloride: 94 mmol/L — ABNORMAL LOW (ref 96–106)
GFR, EST AFRICAN AMERICAN: 81 mL/min/{1.73_m2} (ref >59)
GFR, EST NON AFRICAN AMERICAN: 70 mL/min/{1.73_m2} (ref >59)
GLUCOSE: 106 mg/dL — AB (ref 65–99)
Globulin, Total: 2.3 g/dL (ref 1.5–4.5)
Potassium: 3.9 mmol/L (ref 3.5–5.2)
SODIUM: 137 mmol/L (ref 134–144)
Total Protein: 6.5 g/dL (ref 6.0–8.5)

## 2015-12-16 LAB — MICROALBUMIN / CREATININE URINE RATIO
CREATININE, UR: 267.3 mg/dL
MICROALB/CREAT RATIO: 10.7 mg/g{creat} (ref 0.0–30.0)
MICROALBUM., U, RANDOM: 28.5 ug/mL

## 2016-02-08 ENCOUNTER — Other Ambulatory Visit: Payer: Self-pay | Admitting: Family Medicine

## 2016-02-08 DIAGNOSIS — F419 Anxiety disorder, unspecified: Principal | ICD-10-CM

## 2016-02-08 DIAGNOSIS — F329 Major depressive disorder, single episode, unspecified: Secondary | ICD-10-CM

## 2016-02-08 NOTE — Telephone Encounter (Signed)
Patient has appointment for 03-11-16 and is requesting a refill on Alprazolam

## 2016-02-09 NOTE — Telephone Encounter (Signed)
Routed to Dr. Shah for approval 

## 2016-02-12 NOTE — Telephone Encounter (Signed)
Patient checking status on refill request.

## 2016-02-15 MED ORDER — ALPRAZOLAM 0.25 MG PO TABS: 0.2500 mg | ORAL_TABLET | Freq: Every day | ORAL | Status: DC | PRN

## 2016-02-15 NOTE — Telephone Encounter (Signed)
Prescription for Alprazolam is ready for pickup

## 2016-02-16 ENCOUNTER — Encounter: Payer: Self-pay | Admitting: Family Medicine

## 2016-02-16 ENCOUNTER — Ambulatory Visit (INDEPENDENT_AMBULATORY_CARE_PROVIDER_SITE_OTHER): Payer: BLUE CROSS/BLUE SHIELD | Admitting: Family Medicine

## 2016-02-16 VITALS — BP 141/80 | HR 73 | Temp 98.4°F | Resp 18 | Ht 71.0 in | Wt 162.8 lb

## 2016-02-16 DIAGNOSIS — F418 Other specified anxiety disorders: Secondary | ICD-10-CM

## 2016-02-16 DIAGNOSIS — F329 Major depressive disorder, single episode, unspecified: Secondary | ICD-10-CM

## 2016-02-16 DIAGNOSIS — F419 Anxiety disorder, unspecified: Principal | ICD-10-CM

## 2016-02-16 MED ORDER — ALPRAZOLAM 0.25 MG PO TABS: 0.2500 mg | ORAL_TABLET | Freq: Every day | ORAL | Status: DC | PRN

## 2016-02-16 NOTE — Progress Notes (Signed)
Name: Kenneth Franklin   MRN: [id number]    DOB: 11-26-1946   Date:02/16/2016       Progress Note  Subjective  Chief Complaint  Chief Complaint  Patient presents with  . Medication Refill    HPI  Anxiety: Symptoms include feeling stressed out, nervous, anxious. Wife is critically ill with Brain Cancer. He is on Alprazolam 0.25mg  daily as needed. This helps relieve his symptoms temporarily. Pt. Is requesting at leas t a 50-month supply. Reports no side effects from Alprazolam and is aware of the dependence potential for alprazolam  Past Medical History  Diagnosis Date  . Anxiety   . Hyperlipidemia   . Hypertension   . Depression     Past Surgical History  Procedure Laterality Date  . Cataract extraction Bilateral     Family History  Problem Relation Age of Onset  . Diabetes Brother     Social History   Social History  . Marital Status: Single    Spouse Name: N/A  . Number of Children: N/A  . Years of Education: N/A   Occupational History  . Not on file.   Social History Main Topics  . Smoking status: Current Some Day Smoker    Types: Cigars  . Smokeless tobacco: Never Used  . Alcohol Use: No  . Drug Use: No  . Sexual Activity: No   Other Topics Concern  . Not on file   Social History Narrative     Current outpatient prescriptions:  .  ALPRAZolam (XANAX) 0.25 MG tablet, Take 1 tablet (0.25 mg total) by mouth daily as needed for anxiety., Disp: 30 tablet, Rfl: 0 .  aspirin 81 MG tablet, Take 1 tablet by mouth daily., Disp: , Rfl:  .  BLOOD GLUCOSE MONITORING SUPPL, ONETOUCH ULTRA (Device) - Historical Medication Active, Disp: , Rfl:  .  busPIRone (BUSPAR) 10 MG tablet, Take 1 tablet (10 mg total) by mouth 2 (two) times daily., Disp: 180 tablet, Rfl: 0 .  Cholecalciferol 1000 UNITS capsule, Take by mouth., Disp: , Rfl:  .  escitalopram (LEXAPRO) 20 MG tablet, Take 1 tablet (20 mg total) by mouth daily., Disp: 90 tablet, Rfl: 0 .  losartan (COZAAR) 100 MG  tablet, Take 1 tablet (100 mg total) by mouth daily., Disp: 90 tablet, Rfl: 0 .  metFORMIN (GLUCOPHAGE) 500 MG tablet, Take 1 tablet (500 mg total) by mouth daily with breakfast., Disp: 90 tablet, Rfl: 0 .  ranitidine (ZANTAC) 300 MG tablet, Take by mouth., Disp: , Rfl:  .  simvastatin (ZOCOR) 40 MG tablet, Take 1 tablet (40 mg total) by mouth at bedtime., Disp: 90 tablet, Rfl: 0  Allergies  Allergen Reactions  . Propoxyphene      Review of Systems  Psychiatric/Behavioral: Positive for depression. The patient is nervous/anxious and has insomnia.      Objective  Filed Vitals:   02/16/16 1019  BP: 141/80  Pulse: 73  Temp: 98.4 F (36.9 C)  TempSrc: Oral  Resp: 18  Height: 5\' 11"  (1.803 m)  Weight: 162 lb 12.8 oz (73.846 kg)  SpO2: 97%    Physical Exam  Constitutional: He is oriented to person, place, and time and well-developed, well-nourished, and in no distress.  HENT:  Head: Normocephalic and atraumatic.  Cardiovascular: Normal rate and regular rhythm.   Pulmonary/Chest: Effort normal and breath sounds normal.  Neurological: He is alert and oriented to person, place, and time.  Skin: Skin is warm and dry.  Psychiatric: Mood, memory, affect and  judgment normal.  Nursing note and vitals reviewed.      Assessment & Plan  1. Anxiety and depression Refill for alprazolam provided to help with anxiety and depression. Follow-up in 3 months. - ALPRAZolam (XANAX) 0.25 MG tablet; Take 1 tablet (0.25 mg total) by mouth daily as needed for anxiety.  Dispense: 30 tablet; Refill: 2   Yanel Dombrosky [facility] 02/16/2016 10:40 AM

## 2016-03-11 ENCOUNTER — Ambulatory Visit: Payer: BLUE CROSS/BLUE SHIELD | Admitting: Family Medicine

## 2016-05-18 ENCOUNTER — Ambulatory Visit (INDEPENDENT_AMBULATORY_CARE_PROVIDER_SITE_OTHER): Payer: BLUE CROSS/BLUE SHIELD | Admitting: Family Medicine

## 2016-05-18 ENCOUNTER — Encounter: Payer: Self-pay | Admitting: Family Medicine

## 2016-05-18 ENCOUNTER — Other Ambulatory Visit: Payer: Self-pay

## 2016-05-18 VITALS — BP 137/81 | HR 90 | Temp 98.2°F | Resp 17 | Ht 71.0 in | Wt 159.7 lb

## 2016-05-18 DIAGNOSIS — E119 Type 2 diabetes mellitus without complications: Secondary | ICD-10-CM | POA: Diagnosis not present

## 2016-05-18 DIAGNOSIS — I10 Essential (primary) hypertension: Secondary | ICD-10-CM | POA: Diagnosis not present

## 2016-05-18 DIAGNOSIS — F329 Major depressive disorder, single episode, unspecified: Secondary | ICD-10-CM

## 2016-05-18 DIAGNOSIS — K219 Gastro-esophageal reflux disease without esophagitis: Secondary | ICD-10-CM

## 2016-05-18 DIAGNOSIS — F419 Anxiety disorder, unspecified: Principal | ICD-10-CM

## 2016-05-18 DIAGNOSIS — E785 Hyperlipidemia, unspecified: Secondary | ICD-10-CM

## 2016-05-18 DIAGNOSIS — F418 Other specified anxiety disorders: Secondary | ICD-10-CM

## 2016-05-18 LAB — GLUCOSE, POCT (MANUAL RESULT ENTRY): POC GLUCOSE: 101 mg/dL — AB (ref 70–99)

## 2016-05-18 LAB — POCT GLYCOSYLATED HEMOGLOBIN (HGB A1C): Hemoglobin A1C: 6.1

## 2016-05-18 MED ORDER — ALPRAZOLAM 0.25 MG PO TABS: 0.2500 mg | ORAL_TABLET | Freq: Every day | ORAL | Status: DC | PRN

## 2016-05-18 MED ORDER — BUSPIRONE HCL 10 MG PO TABS: 10.0000 mg | ORAL_TABLET | Freq: Two times a day (BID) | ORAL | Status: DC

## 2016-05-18 MED ORDER — ESCITALOPRAM OXALATE 20 MG PO TABS: 20.0000 mg | ORAL_TABLET | Freq: Every day | ORAL | Status: DC

## 2016-05-18 MED ORDER — RANITIDINE HCL 300 MG PO TABS: 300.0000 mg | ORAL_TABLET | Freq: Every day | ORAL | Status: DC

## 2016-05-18 MED ORDER — SIMVASTATIN 40 MG PO TABS: 40.0000 mg | ORAL_TABLET | Freq: Every day | ORAL | Status: DC

## 2016-05-18 MED ORDER — METFORMIN HCL 500 MG PO TABS: 500.0000 mg | ORAL_TABLET | Freq: Every day | ORAL | Status: DC

## 2016-05-18 MED ORDER — LOSARTAN POTASSIUM 100 MG PO TABS: 100.0000 mg | ORAL_TABLET | Freq: Every day | ORAL | Status: DC

## 2016-05-18 NOTE — Progress Notes (Signed)
Name: Kenneth Franklin   MRN: [id number]    DOB: 02-07-1946   Date:05/18/2016       Progress Note  Subjective  Chief Complaint  Chief Complaint  Patient presents with  . Follow-up    3 mo  . Diabetes  . Medication Refill    Diabetes He presents for his follow-up diabetic visit. He has type 2 diabetes mellitus. His disease course has been stable. Associated symptoms include fatigue. Pertinent negatives for diabetes include no chest pain, no foot paresthesias, no polydipsia and no polyuria. Symptoms are stable. Pertinent negatives for diabetic complications include no CVA or heart disease. Current diabetic treatment includes oral agent (monotherapy). Frequency home blood tests: does not check his Blood Glucose at home. An ACE inhibitor/angiotensin II receptor blocker is being taken. Eye exam is not current.  Hyperlipidemia This is a chronic problem. The problem is controlled. Recent lipid tests were reviewed and are normal. Pertinent negatives include no chest pain, leg pain, myalgias or shortness of breath. Current antihyperlipidemic treatment includes statins.  Hypertension This is a chronic problem. The problem is unchanged. The problem is controlled. Associated symptoms include anxiety. Pertinent negatives include no chest pain, palpitations or shortness of breath. Past treatments include angiotensin blockers. There is no history of kidney disease, CAD/MI or CVA.  Anxiety Presents for follow-up visit. The problem has been gradually worsening (worried sick about his wife's deteriorating health). Symptoms include depressed mood, excessive worry, insomnia and malaise. Patient reports no chest pain, palpitations or shortness of breath. The severity of symptoms is moderate. The symptoms are aggravated by family issues.   His past medical history is significant for anxiety/panic attacks and depression. Past treatments include benzodiazephines, SSRIs and non-benzodiazephine anxiolytics. Compliance  with prior treatments has been good.  Depression      The patient presents with depression.  This is a chronic problem.  The onset quality is gradual.   The problem has been gradually worsening since onset.  Associated symptoms include fatigue, hopelessness, insomnia, decreased interest and sad.  Associated symptoms include not irritable and no myalgias.( Worried about his wife's fast deteriorating health)     The symptoms are aggravated by family issues and work stress.  Past treatments include SSRIs - Selective serotonin reuptake inhibitors.  Compliance with treatment is good.  Risk factors include stress.   Past medical history includes anxiety and depression.     Past Medical History  Diagnosis Date  . Anxiety   . Hyperlipidemia   . Hypertension   . Depression     Past Surgical History  Procedure Laterality Date  . Cataract extraction Bilateral     Family History  Problem Relation Age of Onset  . Diabetes Brother     Social History   Social History  . Marital Status: Single    Spouse Name: N/A  . Number of Children: N/A  . Years of Education: N/A   Occupational History  . Not on file.   Social History Main Topics  . Smoking status: Current Some Day Smoker    Types: Cigars  . Smokeless tobacco: Never Used  . Alcohol Use: No  . Drug Use: No  . Sexual Activity: No   Other Topics Concern  . Not on file   Social History Narrative     Current outpatient prescriptions:  .  ALPRAZolam (XANAX) 0.25 MG tablet, Take 1 tablet (0.25 mg total) by mouth daily as needed for anxiety., Disp: 30 tablet, Rfl: 2 .  aspirin 81 MG  tablet, Take 1 tablet by mouth daily., Disp: , Rfl:  .  BLOOD GLUCOSE MONITORING SUPPL, ONETOUCH ULTRA (Device) - Historical Medication Active, Disp: , Rfl:  .  busPIRone (BUSPAR) 10 MG tablet, Take 1 tablet (10 mg total) by mouth 2 (two) times daily., Disp: 180 tablet, Rfl: 0 .  Cholecalciferol 1000 UNITS capsule, Take by mouth., Disp: , Rfl:  .   escitalopram (LEXAPRO) 20 MG tablet, Take 1 tablet (20 mg total) by mouth daily., Disp: 90 tablet, Rfl: 0 .  losartan (COZAAR) 100 MG tablet, Take 1 tablet (100 mg total) by mouth daily., Disp: 90 tablet, Rfl: 0 .  metFORMIN (GLUCOPHAGE) 500 MG tablet, Take 1 tablet (500 mg total) by mouth daily with breakfast., Disp: 90 tablet, Rfl: 0 .  ranitidine (ZANTAC) 300 MG tablet, Take by mouth., Disp: , Rfl:  .  simvastatin (ZOCOR) 40 MG tablet, Take 1 tablet (40 mg total) by mouth at bedtime., Disp: 90 tablet, Rfl: 0  Allergies  Allergen Reactions  . Propoxyphene      Review of Systems  Constitutional: Positive for fatigue.  Respiratory: Negative for shortness of breath.   Cardiovascular: Negative for chest pain and palpitations.  Musculoskeletal: Negative for myalgias.  Endo/Heme/Allergies: Negative for polydipsia.  Psychiatric/Behavioral: Positive for depression. The patient has insomnia.     Objective  Filed Vitals:   05/18/16 0843  BP: 137/81  Pulse: 90  Temp: 98.2 F (36.8 C)  TempSrc: Oral  Resp: 17  Height: 5\' 11"  (1.803 m)  Weight: 159 lb 11.2 oz (72.439 kg)  SpO2: 96%    Physical Exam  Constitutional: He is oriented to person, place, and time and well-developed, well-nourished, and in no distress. He is not irritable.  HENT:  Head: Normocephalic and atraumatic.  Cardiovascular: Normal rate, regular rhythm and normal heart sounds.   No murmur heard. Pulmonary/Chest: Effort normal and breath sounds normal. He has no wheezes. He has no rales.  Musculoskeletal: He exhibits no edema.  Neurological: He is alert and oriented to person, place, and time.  Psychiatric: Memory, affect and judgment normal. His mood appears anxious. He exhibits a depressed mood.  Nursing note and vitals reviewed.    Assessment & Plan  1. Anxiety and depression Appears more anxious, concerned about his wife's fast deteriorating health. We will continue on alprazolam, buspirone, and Lexapro  as prescribed. Follow-up in 3-4 month - ALPRAZolam (XANAX) 0.25 MG tablet; Take 1 tablet (0.25 mg total) by mouth daily as needed for anxiety.  Dispense: 30 tablet; Refill: 2 - busPIRone (BUSPAR) 10 MG tablet; Take 1 tablet (10 mg total) by mouth 2 (two) times daily.  Dispense: 180 tablet; Refill: 0 - escitalopram (LEXAPRO) 20 MG tablet; Take 1 tablet (20 mg total) by mouth daily.  Dispense: 90 tablet; Refill: 0  2. Dyslipidemia FLP and goal from January 2017. - simvastatin (ZOCOR) 40 MG tablet; Take 1 tablet (40 mg total) by mouth at bedtime.  Dispense: 90 tablet; Refill: 0  3. Essential hypertension  - losartan (COZAAR) 100 MG tablet; Take 1 tablet (100 mg total) by mouth daily.  Dispense: 90 tablet; Refill: 0  4. Controlled type 2 diabetes mellitus without complication, without long-term current use of insulin (HCC) A1c is 6.1%, diabetes is at goal. No change in pharmacotherapy - metFORMIN (GLUCOPHAGE) 500 MG tablet; Take 1 tablet (500 mg total) by mouth daily with breakfast.  Dispense: 90 tablet; Refill: 0 - POCT HgB A1C - POCT Glucose (CBG)  5. Gastroesophageal reflux disease, esophagitis  presence not specified  - ranitidine (ZANTAC) 300 MG tablet; Take 1 tablet (300 mg total) by mouth at bedtime.  Dispense: 90 tablet; Refill: 0   Tyr Franca [facility] 05/18/2016 9:03 AM

## 2016-06-16 ENCOUNTER — Ambulatory Visit (INDEPENDENT_AMBULATORY_CARE_PROVIDER_SITE_OTHER): Payer: BLUE CROSS/BLUE SHIELD | Admitting: Family Medicine

## 2016-06-16 ENCOUNTER — Encounter: Payer: Self-pay | Admitting: Family Medicine

## 2016-06-16 VITALS — BP 126/70 | HR 98 | Temp 97.8°F | Resp 18 | Ht 71.0 in | Wt 153.2 lb

## 2016-06-16 DIAGNOSIS — R531 Weakness: Secondary | ICD-10-CM | POA: Insufficient documentation

## 2016-06-16 DIAGNOSIS — R5383 Other fatigue: Secondary | ICD-10-CM | POA: Insufficient documentation

## 2016-06-16 DIAGNOSIS — Z7189 Other specified counseling: Secondary | ICD-10-CM | POA: Diagnosis not present

## 2016-06-16 DIAGNOSIS — W57XXXA Bitten or stung by nonvenomous insect and other nonvenomous arthropods, initial encounter: Secondary | ICD-10-CM

## 2016-06-16 DIAGNOSIS — S30861A Insect bite (nonvenomous) of abdominal wall, initial encounter: Secondary | ICD-10-CM | POA: Diagnosis not present

## 2016-06-16 MED ORDER — SERTRALINE HCL 25 MG PO TABS: 25.0000 mg | ORAL_TABLET | Freq: Every day | ORAL | Status: DC

## 2016-06-16 NOTE — Progress Notes (Signed)
Name: Kenneth Franklin   MRN: [id number]    DOB: [birth year]   Date:06/16/2016       Progress Note  Subjective  Chief Complaint  Chief Complaint  Patient presents with  . Insect Bite    tick bite right side  . Anxiety    patient stated he is greiving due to death of wife. Forgetting to take medication, lonely, emotional    HPI  Grief: Patient grieving the loss of his wife (whom he was married to for 30+ years). His wife passed away on 2023-06-04 rd after a 1.5 year-long fight with breast and brain cancer involving multiple cycles of radiation, breast surgery and chemotherapy. He is feeling sad, has no energy, no appetite, feels sick to his stomach, and cannot sleep. 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 He feels like the area has not healed up, is still sore. He has experienced some chills, no fever, sweating and fatigue (although also grieving).     Past Medical History  Diagnosis Date  . Anxiety   . Hyperlipidemia   . Hypertension   . Depression     Past Surgical History  Procedure Laterality Date  . Cataract extraction Bilateral     Family History  Problem Relation Age of Onset  . Diabetes Brother     Social History   Social History  . Marital Status: Single    Spouse Name: N/A  . Number of Children: N/A  . Years of Education: N/A   Occupational History  . Not on file.   Social History Main Topics  . Smoking status: Current Some Day Smoker    Types: Cigars  . Smokeless tobacco: Never Used  . Alcohol Use: No  . Drug Use: No  . Sexual Activity: No   Other Topics Concern  . Not on file   Social History Narrative     Current outpatient prescriptions:  .  ALPRAZolam (XANAX) 0.25 MG tablet, Take 1 tablet (0.25 mg total) by mouth daily as needed for anxiety., Disp: 30 tablet,  Rfl: 2 .  aspirin 81 MG tablet, Take 1 tablet by mouth daily., Disp: , Rfl:  .  BLOOD GLUCOSE MONITORING SUPPL, ONETOUCH ULTRA (Device) - Historical Medication Active, Disp: , Rfl:  .  busPIRone (BUSPAR) 10 MG tablet, Take 1 tablet (10 mg total) by mouth 2 (two) times daily., Disp: 180 tablet, Rfl: 0 .  Cholecalciferol 1000 UNITS capsule, Take by mouth., Disp: , Rfl:  .  escitalopram (LEXAPRO) 20 MG tablet, Take 1 tablet (20 mg total) by mouth daily., Disp: 90 tablet, Rfl: 0 .  losartan (COZAAR) 100 MG tablet, Take 1 tablet (100 mg total) by mouth daily., Disp: 90 tablet, Rfl: 0 .  metFORMIN (GLUCOPHAGE) 500 MG tablet, Take 1 tablet (500 mg total) by mouth daily with breakfast., Disp: 90 tablet, Rfl: 0 .  ranitidine (ZANTAC) 300 MG tablet, Take 1 tablet (300 mg total) by mouth at bedtime., Disp: 90 tablet, Rfl: 0 .  simvastatin (ZOCOR) 40 MG tablet, Take 1 tablet (40 mg total) by mouth at bedtime., Disp: 90 tablet, Rfl: 0  Allergies  Allergen Reactions  . Propoxyphene      Review of Systems  Constitutional: Positive for chills and malaise/fatigue. Negative for fever.  Respiratory:  Positive for shortness of breath.   Cardiovascular: Negative for chest pain.  Gastrointestinal: Positive for nausea and vomiting. Negative for abdominal pain.  Psychiatric/Behavioral: Negative for depression. The patient has insomnia.     Objective  Filed Vitals:   06/16/16 1533  BP: 126/70  Pulse: 98  Temp: 97.8 F (36.6 C)  TempSrc: Oral  Resp: 18  Height: 5\' 11"  (1.803 m)  Weight: 153 lb 3.2 oz (69.491 kg)  SpO2: 95%    Physical Exam  Constitutional: He is oriented to person, place, and time and well-developed, well-nourished, and in no distress.  HENT:  Head: Normocephalic and atraumatic.  Cardiovascular: Normal rate, regular rhythm and normal heart sounds.   No murmur heard. Pulmonary/Chest: Effort normal and breath sounds normal. No respiratory distress. He has no wheezes.  Abdominal:  Soft. Bowel sounds are normal. There is no tenderness.  Musculoskeletal:       Back:  Raised dried pustular area over the right lower back, no surrounding erythema or drainage.  Neurological: He is alert and oriented to person, place, and time.  Psychiatric: Memory and judgment normal. He exhibits a depressed mood. He has a flat affect.  Nursing note and vitals reviewed.   Recent Results (from the past 2160 hour(s))  POCT HgB A1C     Status: Abnormal   Collection Time: 05/18/16  9:30 AM  Result Value Ref Range   Hemoglobin A1C 6.1   POCT Glucose (CBG)     Status: Abnormal   Collection Time: 05/18/16  9:30 AM  Result Value Ref Range   POC Glucose 101 (A) 70 - 99 mg/dl     Assessment & Plan  1. Tick bite of flank, initial encounter Obtain serologic testing, consider starting on doxycycline if - HGE(IgG/M)+LymeAb(IgM)+RkyIgM  2. Grief counseling Explained that grief is a normal reaction to a stressful for statin event. We will start on low-dose SSRI, if his symptoms persist, consider referral to grief counseling - sertraline (ZOLOFT) 25 MG tablet; Take 1 tablet (25 mg total) by mouth at bedtime.  Dispense: 30 tablet; Refill: 2  3. Other fatigue  - CBC with Differential - TSH - COMPLETE METABOLIC PANEL WITH GFR   Sejal Cofield [facility]ad A[facility] 06/16/2016 4:05 PM

## 2016-06-17 LAB — CBC WITH DIFFERENTIAL/PLATELET
BASOS PCT: 0 %
Basophils Absolute: 0 cells/uL (ref 0–200)
Eosinophils Absolute: 0 cells/uL — ABNORMAL LOW (ref 15–500)
Eosinophils Relative: 0 %
HEMATOCRIT: 44.9 % (ref 38.5–50.0)
Hemoglobin: 15.4 g/dL (ref 13.2–17.1)
LYMPHS ABS: 1876 {cells}/uL (ref 850–3900)
LYMPHS PCT: 28 %
MCH: 32 pg (ref 27.0–33.0)
MCHC: 34.3 g/dL (ref 32.0–36.0)
MCV: 93.3 fL (ref 80.0–100.0)
MONO ABS: 335 {cells}/uL (ref 200–950)
MPV: 8.9 fL (ref 7.5–12.5)
Monocytes Relative: 5 %
NEUTROS ABS: 4489 {cells}/uL (ref 1500–7800)
Neutrophils Relative %: 67 %
Platelets: 225 10*3/uL (ref 140–400)
RBC: 4.81 MIL/uL (ref 4.20–5.80)
RDW: 13.7 % (ref 11.0–15.0)
WBC: 6.7 10*3/uL (ref 3.8–10.8)

## 2016-06-17 LAB — TSH: TSH: 1.98 mIU/L (ref 0.40–4.50)

## 2016-06-17 LAB — COMPLETE METABOLIC PANEL WITH GFR
ALBUMIN: 4.3 g/dL (ref 3.6–5.1)
ALT: 11 U/L (ref 9–46)
AST: 15 U/L (ref 10–35)
Alkaline Phosphatase: 57 U/L (ref 40–115)
BUN: 10 mg/dL (ref 7–25)
CHLORIDE: 101 mmol/L (ref 98–110)
CO2: 27 mmol/L (ref 20–31)
Calcium: 9.4 mg/dL (ref 8.6–10.3)
Creat: 1.07 mg/dL (ref 0.70–1.25)
GFR, Est African American: 82 mL/min (ref >=60)
GFR, Est Non African American: 71 mL/min (ref >=60)
GLUCOSE: 81 mg/dL (ref 65–99)
POTASSIUM: 5.1 mmol/L (ref 3.5–5.3)
SODIUM: 134 mmol/L — AB (ref 135–146)
Total Bilirubin: 0.9 mg/dL (ref 0.2–1.2)
Total Protein: 6.8 g/dL (ref 6.1–8.1)

## 2016-06-22 NOTE — Progress Notes (Signed)
Patient notified of lab results

## 2016-06-26 ENCOUNTER — Emergency Department
Admission: EM | Admit: 2016-06-26 | Discharge: 2016-06-26 | Disposition: A | Discharge status: Home / Self Care | Payer: BLUE CROSS/BLUE SHIELD | Attending: Emergency Medicine | Admitting: Emergency Medicine

## 2016-06-26 ENCOUNTER — Emergency Department: Payer: BLUE CROSS/BLUE SHIELD

## 2016-06-26 ENCOUNTER — Encounter: Payer: Self-pay | Admitting: Emergency Medicine

## 2016-06-26 DIAGNOSIS — R200 Anesthesia of skin: Secondary | ICD-10-CM | POA: Diagnosis not present

## 2016-06-26 DIAGNOSIS — E785 Hyperlipidemia, unspecified: Secondary | ICD-10-CM | POA: Insufficient documentation

## 2016-06-26 DIAGNOSIS — E119 Type 2 diabetes mellitus without complications: Secondary | ICD-10-CM | POA: Insufficient documentation

## 2016-06-26 DIAGNOSIS — R202 Paresthesia of skin: Secondary | ICD-10-CM | POA: Insufficient documentation

## 2016-06-26 DIAGNOSIS — I10 Essential (primary) hypertension: Secondary | ICD-10-CM | POA: Insufficient documentation

## 2016-06-26 DIAGNOSIS — F17210 Nicotine dependence, cigarettes, uncomplicated: Secondary | ICD-10-CM | POA: Insufficient documentation

## 2016-06-26 DIAGNOSIS — Z7984 Long term (current) use of oral hypoglycemic drugs: Secondary | ICD-10-CM | POA: Diagnosis not present

## 2016-06-26 DIAGNOSIS — Z7982 Long term (current) use of aspirin: Secondary | ICD-10-CM | POA: Diagnosis not present

## 2016-06-26 HISTORY — DX: Type 2 diabetes mellitus without complications: E11.9

## 2016-06-26 LAB — CBC
HEMATOCRIT: 42.8 % (ref 40.0–52.0)
Hemoglobin: 15.1 g/dL (ref 13.0–18.0)
MCH: 33 pg (ref 26.0–34.0)
MCHC: 35.1 g/dL (ref 32.0–36.0)
MCV: 93.9 fL (ref 80.0–100.0)
PLATELETS: 169 10*3/uL (ref 150–440)
RBC: 4.56 MIL/uL (ref 4.40–5.90)
RDW: 13.7 % (ref 11.5–14.5)
WBC: 4.6 10*3/uL (ref 3.8–10.6)

## 2016-06-26 LAB — BASIC METABOLIC PANEL
Anion gap: 5 (ref 5–15)
BUN: 9 mg/dL (ref 6–20)
CALCIUM: 9.1 mg/dL (ref 8.9–10.3)
CO2: 29 mmol/L (ref 22–32)
Chloride: 101 mmol/L (ref 101–111)
Creatinine, Ser: 0.95 mg/dL (ref 0.61–1.24)
GFR calc Af Amer: >60 mL/min (ref >60)
Glucose, Bld: 112 mg/dL — ABNORMAL HIGH (ref 65–99)
POTASSIUM: 4.3 mmol/L (ref 3.5–5.1)
SODIUM: 135 mmol/L (ref 135–145)

## 2016-06-26 LAB — URINALYSIS COMPLETE WITH MICROSCOPIC (ARMC ONLY)
BACTERIA UA: NONE SEEN
BILIRUBIN URINE: NEGATIVE
GLUCOSE, UA: 50 mg/dL — AB
HGB URINE DIPSTICK: NEGATIVE
KETONES UR: NEGATIVE mg/dL
LEUKOCYTES UA: NEGATIVE
NITRITE: NEGATIVE
Protein, ur: NEGATIVE mg/dL
RBC / HPF: NONE SEEN RBC/hpf (ref 0–5)
SPECIFIC GRAVITY, URINE: 1.006 (ref 1.005–1.030)
pH: 6 (ref 5.0–8.0)

## 2016-06-26 MED ORDER — IOPAMIDOL (ISOVUE-370) INJECTION 76%
125.0000 mL | Freq: Once | INTRAVENOUS | Status: AC | PRN
  Administered 2016-06-26: 125 mL via INTRAVENOUS

## 2016-06-26 NOTE — ED Triage Notes (Signed)
States left leg started to feel as "if it were asleep" today at around 1330.  Patient reports able to put weight on leg but that the leg feels "funny".  Good movement seen to left leg, equal to right.  Equal sensation noted.

## 2016-06-26 NOTE — ED Provider Notes (Signed)
[facility] Emergency Department Provider Note  ____________________________________________  Time seen: Approximately 4:56 PM  I have reviewed the triage vital signs and the nursing notes.   HISTORY  Chief Complaint Extremity Weakness   HPI Kenneth Franklin is a 70 y.o. male h/o DM, HTN, HLD who presents for evaluation of left lower extremity numbness. Patient reports that he was trying to get into his car when he felt like his left leg and foot were asleep. He was able to get in the car and drive to his brother's house but continued to have the sensation which brought him to the emergency department for evaluation. Patient denies any history of neuropathy or numbness prior to this episode. He denies any trauma. He does not have a history of DVT or peripheral vascular disease. He denies any pain or weakness in his leg. He denies any back pain, saddle anesthesia, abdominal pain. He reports that he is able to feel his foot when he touches it but fells like his foot is asleep.He was told by his PCP before that he has weak pulses on his b/l LE but never had any study to better evaluate this.  Past Medical History:  Diagnosis Date  . Anxiety   . Depression   . Diabetes mellitus without complication ([place])   . Hyperlipidemia   . Hypertension     Patient Active Problem List   Diagnosis Date Noted  . Tick bite of flank 06/16/2016  . Grief counseling 06/16/2016  . Fatigue 06/16/2016  . Absent pulse in lower extremity 09/14/2015  . Anxiety and depression 04/21/2015  . Diabetes mellitus type 2, controlled, without complications ([place]) 0000000  . Acid reflux 04/21/2015  . Calcium blood increased 04/21/2015  . Dyslipidemia 04/21/2015  . HLD (hyperlipidemia) 04/21/2015  . BP (high blood pressure) 04/21/2015    Past Surgical History:  Procedure Laterality Date  . CATARACT EXTRACTION Bilateral     Prior to Admission medications   Medication Sig Start Date  End Date Taking? Authorizing Provider  ALPRAZolam (XANAX) 0.25 MG tablet Take 1 tablet (0.25 mg total) by mouth daily as needed for anxiety. 05/18/16   Roselee Nova, MD  aspirin 81 MG tablet Take 1 tablet by mouth daily.    Historical Provider, MD  BLOOD GLUCOSE MONITORING SUPPL ONETOUCH ULTRA (Device) - Historical Medication Active    Historical Provider, MD  busPIRone (BUSPAR) 10 MG tablet Take 1 tablet (10 mg total) by mouth 2 (two) times daily. 05/18/16   Roselee Nova, MD  Cholecalciferol 1000 UNITS capsule Take by mouth.    Historical Provider, MD  escitalopram (LEXAPRO) 20 MG tablet Take 1 tablet (20 mg total) by mouth daily. 05/18/16   Roselee Nova, MD  losartan (COZAAR) 100 MG tablet Take 1 tablet (100 mg total) by mouth daily. 05/18/16   Roselee Nova, MD  metFORMIN (GLUCOPHAGE) 500 MG tablet Take 1 tablet (500 mg total) by mouth daily with breakfast. 05/18/16   Roselee Nova, MD  ranitidine (ZANTAC) 300 MG tablet Take 1 tablet (300 mg total) by mouth at bedtime. 05/18/16   Roselee Nova, MD  sertraline (ZOLOFT) 25 MG tablet Take 1 tablet (25 mg total) by mouth at bedtime. 06/16/16   Roselee Nova, MD  simvastatin (ZOCOR) 40 MG tablet Take 1 tablet (40 mg total) by mouth at bedtime. 05/18/16   Roselee Nova, MD    Allergies Propoxyphene  Family History  Problem Relation Age of Onset  . Diabetes Brother     Social History Social History  Substance Use Topics  . Smoking status: Current Some Day Smoker    Types: Cigars  . Smokeless tobacco: Never Used  . Alcohol use No    Review of Systems  Constitutional: Negative for fever. Eyes: Negative for visual changes. ENT: Negative for sore throat. Cardiovascular: Negative for chest pain. Respiratory: Negative for shortness of breath. Gastrointestinal: Negative for abdominal pain, vomiting or diarrhea. Genitourinary: Negative for dysuria. Musculoskeletal: Negative for back pain. + LLE numbness Skin: Negative  for rash. Neurological: Negative for headaches, weakness or numbness.  ____________________________________________   PHYSICAL EXAM:  VITAL SIGNS: ED Triage Vitals [06/26/16 1514]  Enc Vitals Group     BP 133/75     Pulse Rate 89     Resp 18     Temp 98.2 F (36.8 C)     Temp Source Oral     SpO2 97 %     Weight 145 lb (65.8 kg)     Height 6\' 1"  (1.854 m)     Head Circumference      Peak Flow      Pain Score 0     Pain Loc      Pain Edu?      Excl. in [place]?     Constitutional: Alert and oriented. Well appearing and in no apparent distress. HEENT:      Head: Normocephalic and atraumatic.         Eyes: Conjunctivae are normal. Sclera is non-icteric. EOMI. PERRL      Mouth/Throat: Mucous membranes are moist.       Neck: Supple with no signs of meningismus. Cardiovascular: Regular rate and rhythm. No murmurs, gallops, or rubs. 1+ DP and PT pulses on b/l LE. No JVD. Respiratory: Normal respiratory effort. Lungs are clear to auscultation bilaterally. No wheezes, crackles, or rhonchi.  Gastrointestinal: Soft, non tender, and non distended with positive bowel sounds. No rebound or guarding. Genitourinary: No CVA tenderness. Musculoskeletal: Nontender with normal range of motion in all extremities. No edema, cyanosis, or erythema of extremities. Left foot is warm with brisk capillary refill Neurologic: A & O x3, PERRL, no nystagmus, CN II-XII intact, motor testing reveals good tone and bulk throughout. There is no evidence of pronator drift or dysmetria. Muscle strength is 5/5 throughout. Deep tendon reflexes are 2+ throughout with downgoing toes. Sensory examination is intact. Gait is normal. Normal speech and language.  Skin: Skin is warm, dry and intact. No rash noted. Psychiatric: Mood and affect are normal. Speech and behavior are normal.  ____________________________________________   LABS (all labs ordered are listed, but only abnormal results are displayed)  Labs Reviewed   BASIC METABOLIC PANEL - Abnormal; Notable for the following:       Result Value   Glucose, Bld 112 (*)    All other components within normal limits  URINALYSIS COMPLETEWITH MICROSCOPIC ([facility] ONLY) - Abnormal; Notable for the following:    Color, Urine YELLOW (*)    APPearance CLEAR (*)    Glucose, UA 50 (*)    Squamous Epithelial / LPF 0-5 (*)    All other components within normal limits  CBC  CBG MONITORING, ED   ____________________________________________  EKG  ED ECG REPORT I, Rudene Re, the attending physician, personally viewed and interpreted this ECG.  Normal sinus rhythm, rate of 92, normal intervals, normal axis, no ST depressions or elevations. No prior  for comparison ____________________________________________  RADIOLOGY  CTA aorto femoral iliac:   ____________________________________________   PROCEDURES  Procedure(s) performed: None Procedures Critical Care performed:  None ____________________________________________   INITIAL IMPRESSION / ASSESSMENT AND PLAN / ED COURSE  70 y.o. male h/o DM, HTN, HLD who presents for evaluation of sudden onset of left lower extremity numbness. No weakness, neuro intact, normal sensation on the foot, 1+ symmetric DP and PT pulses, brisk capillary refill, no trauma. Patient has no neuro deficits to make me think this is a stroke. I will pursue a CTA of aorta/femoral/iliac artery to rule out dissection or acute arterial blockage. No back pain, saddle, anesthesia, urinary or bowel symptoms arguing against a spinal process etiology.   Clinical Course  Comment By Time  CT negative. Patient remains neurologically intact. Symmetric pulses on bilateral LE, brisk capillary refill, warm and well perfused LLE. Will discharge home with close follow up with his PMD in the morning.  I discussed my evaluation of the patient's symptoms, my clinical impression, and my proposed outpatient treatment plan with patient/ family  members. We have discussed anticipatory guidance, scheduled follow-up, and careful return precautions. The patient expresses understanding and is comfortable with the discharge plan. All patient's questions were answered.  Rudene Re, MD 07/30 [phone]    Pertinent labs & imaging results that were available during my care of the patient were reviewed by me and considered in my medical decision making (see chart for details).    ____________________________________________   FINAL CLINICAL IMPRESSION(S) / ED DIAGNOSES  Final diagnoses:  Left leg numbness      NEW MEDICATIONS STARTED DURING THIS VISIT:  New Prescriptions   No medications on file     Note:  This document was prepared using Dragon voice recognition software and may include unintentional dictation errors.    Rudene Re, MD 06/26/16 [phone]

## 2016-06-26 NOTE — Discharge Instructions (Signed)
Follow-up with your doctor tomorrow. Return to the emergency department if your leg becomes painful, pale, red, or swollen. Also return to the emergency department if you have abdominal pain, chest pain, facial droop, or weakness/numbness of one side of her body.

## 2016-06-26 NOTE — ED Notes (Signed)
MD at bedside. 

## 2016-06-27 ENCOUNTER — Telehealth: Payer: Self-pay | Admitting: Family Medicine

## 2016-06-27 DIAGNOSIS — F329 Major depressive disorder, single episode, unspecified: Secondary | ICD-10-CM | POA: Diagnosis not present

## 2016-06-27 DIAGNOSIS — I639 Cerebral infarction, unspecified: Secondary | ICD-10-CM | POA: Diagnosis not present

## 2016-06-27 DIAGNOSIS — F17200 Nicotine dependence, unspecified, uncomplicated: Secondary | ICD-10-CM | POA: Diagnosis not present

## 2016-06-27 DIAGNOSIS — E785 Hyperlipidemia, unspecified: Secondary | ICD-10-CM | POA: Diagnosis not present

## 2016-06-27 DIAGNOSIS — M6281 Muscle weakness (generalized): Secondary | ICD-10-CM | POA: Diagnosis not present

## 2016-06-27 DIAGNOSIS — K219 Gastro-esophageal reflux disease without esophagitis: Secondary | ICD-10-CM | POA: Diagnosis not present

## 2016-06-27 DIAGNOSIS — R29898 Other symptoms and signs involving the musculoskeletal system: Secondary | ICD-10-CM | POA: Diagnosis not present

## 2016-06-27 DIAGNOSIS — R42 Dizziness and giddiness: Secondary | ICD-10-CM | POA: Diagnosis not present

## 2016-06-27 DIAGNOSIS — I10 Essential (primary) hypertension: Secondary | ICD-10-CM | POA: Diagnosis not present

## 2016-06-27 DIAGNOSIS — Z7982 Long term (current) use of aspirin: Secondary | ICD-10-CM | POA: Diagnosis not present

## 2016-06-27 DIAGNOSIS — F419 Anxiety disorder, unspecified: Secondary | ICD-10-CM | POA: Diagnosis not present

## 2016-06-27 DIAGNOSIS — Z23 Encounter for immunization: Secondary | ICD-10-CM | POA: Diagnosis not present

## 2016-06-27 DIAGNOSIS — R9431 Abnormal electrocardiogram [ECG] [EKG]: Secondary | ICD-10-CM | POA: Diagnosis not present

## 2016-06-27 DIAGNOSIS — E119 Type 2 diabetes mellitus without complications: Secondary | ICD-10-CM | POA: Diagnosis not present

## 2016-06-27 DIAGNOSIS — Z7984 Long term (current) use of oral hypoglycemic drugs: Secondary | ICD-10-CM | POA: Diagnosis not present

## 2016-06-27 DIAGNOSIS — Z79899 Other long term (current) drug therapy: Secondary | ICD-10-CM | POA: Diagnosis not present

## 2016-06-27 DIAGNOSIS — E7800 Pure hypercholesterolemia, unspecified: Secondary | ICD-10-CM | POA: Diagnosis not present

## 2016-06-28 DIAGNOSIS — F17290 Nicotine dependence, other tobacco product, uncomplicated: Secondary | ICD-10-CM | POA: Diagnosis not present

## 2016-06-28 DIAGNOSIS — E119 Type 2 diabetes mellitus without complications: Secondary | ICD-10-CM | POA: Diagnosis not present

## 2016-06-28 DIAGNOSIS — F17200 Nicotine dependence, unspecified, uncomplicated: Secondary | ICD-10-CM | POA: Insufficient documentation

## 2016-06-28 DIAGNOSIS — I10 Essential (primary) hypertension: Secondary | ICD-10-CM | POA: Diagnosis not present

## 2016-06-28 DIAGNOSIS — R531 Weakness: Secondary | ICD-10-CM | POA: Diagnosis not present

## 2016-06-28 DIAGNOSIS — I639 Cerebral infarction, unspecified: Secondary | ICD-10-CM | POA: Diagnosis not present

## 2016-06-29 DIAGNOSIS — I639 Cerebral infarction, unspecified: Secondary | ICD-10-CM | POA: Diagnosis not present

## 2016-07-02 DIAGNOSIS — K219 Gastro-esophageal reflux disease without esophagitis: Secondary | ICD-10-CM | POA: Diagnosis not present

## 2016-07-02 DIAGNOSIS — E785 Hyperlipidemia, unspecified: Secondary | ICD-10-CM | POA: Diagnosis not present

## 2016-07-02 DIAGNOSIS — Z9181 History of falling: Secondary | ICD-10-CM | POA: Diagnosis not present

## 2016-07-02 DIAGNOSIS — Z7902 Long term (current) use of antithrombotics/antiplatelets: Secondary | ICD-10-CM | POA: Diagnosis not present

## 2016-07-02 DIAGNOSIS — E119 Type 2 diabetes mellitus without complications: Secondary | ICD-10-CM | POA: Diagnosis not present

## 2016-07-02 DIAGNOSIS — I10 Essential (primary) hypertension: Secondary | ICD-10-CM | POA: Diagnosis not present

## 2016-07-02 DIAGNOSIS — F17210 Nicotine dependence, cigarettes, uncomplicated: Secondary | ICD-10-CM | POA: Diagnosis not present

## 2016-07-02 DIAGNOSIS — Z7982 Long term (current) use of aspirin: Secondary | ICD-10-CM | POA: Diagnosis not present

## 2016-07-02 DIAGNOSIS — I69354 Hemiplegia and hemiparesis following cerebral infarction affecting left non-dominant side: Secondary | ICD-10-CM | POA: Diagnosis not present

## 2016-07-02 DIAGNOSIS — Z7984 Long term (current) use of oral hypoglycemic drugs: Secondary | ICD-10-CM | POA: Diagnosis not present

## 2016-07-02 DIAGNOSIS — F418 Other specified anxiety disorders: Secondary | ICD-10-CM | POA: Diagnosis not present

## 2016-07-04 ENCOUNTER — Other Ambulatory Visit: Payer: Self-pay | Admitting: Family Medicine

## 2016-07-07 ENCOUNTER — Ambulatory Visit (INDEPENDENT_AMBULATORY_CARE_PROVIDER_SITE_OTHER): Payer: BLUE CROSS/BLUE SHIELD | Admitting: Family Medicine

## 2016-07-07 ENCOUNTER — Encounter: Payer: Self-pay | Admitting: Family Medicine

## 2016-07-07 DIAGNOSIS — I639 Cerebral infarction, unspecified: Secondary | ICD-10-CM | POA: Insufficient documentation

## 2016-07-07 MED ORDER — ATORVASTATIN CALCIUM 80 MG PO TABS: 80.0000 mg | ORAL_TABLET | Freq: Every day | ORAL | 3 refills | Status: DC

## 2016-07-07 MED ORDER — CLOPIDOGREL BISULFATE 75 MG PO TABS: 75.0000 mg | ORAL_TABLET | Freq: Every day | ORAL | 3 refills | Status: DC

## 2016-07-07 NOTE — Progress Notes (Signed)
Name: Kenneth Franklin   MRN: [id number]    DOB: 1946-05-25   Date:07/07/2016       Progress Note  Subjective  Chief Complaint  Chief Complaint  Patient presents with  . Hospitalization Follow-up    Patient states legs feel like they are asleep    HPI  Hospital Follow up: P. Presents for follow up after sustaining an ischemic stroke, started with sudden onset left upper and lower extremity weakness and numbness. He was initially treated at [facility], then transferred to [facility] for difficulty with walking. A CT Scan at [facility] showed no acute intra-cranial abnormalities but evidence of small vessel ischemic changes. He was inpatient for 3 days at [facility]. Discharged for follow up and rehab. He feels better, strength and sensation are improving, feels better control over arm and leg.    Past Medical History:  Diagnosis Date  . Anxiety   . Depression   . Diabetes mellitus without complication ([place])   . Hyperlipidemia   . Hypertension     Past Surgical History:  Procedure Laterality Date  . CATARACT EXTRACTION Bilateral     Family History  Problem Relation Age of Onset  . Diabetes Brother     Social History   Social History  . Marital status: Widowed    Spouse name: N/A  . Number of children: N/A  . Years of education: N/A   Occupational History  . Not on file.   Social History Main Topics  . Smoking status: Current Some Day Smoker    Types: Cigars  . Smokeless tobacco: Never Used  . Alcohol use No  . Drug use: No  . Sexual activity: No   Other Topics Concern  . Not on file   Social History Narrative  . No narrative on file     Current Outpatient Prescriptions:  .  ALPRAZolam (XANAX) 0.25 MG tablet, Take 1 tablet (0.25 mg total) by mouth daily as needed for anxiety., Disp: 30 tablet, Rfl: 2 .  aspirin 81 MG tablet, Take 1 tablet by mouth daily., Disp: , Rfl:  .  BLOOD GLUCOSE MONITORING SUPPL, ONETOUCH ULTRA (Device) - Historical Medication Active, Disp: , Rfl:  .   busPIRone (BUSPAR) 10 MG tablet, Take 1 tablet (10 mg total) by mouth 2 (two) times daily., Disp: 180 tablet, Rfl: 0 .  Cholecalciferol 1000 UNITS capsule, Take by mouth., Disp: , Rfl:  .  clopidogrel (PLAVIX) 75 MG tablet, Take 75 mg by mouth., Disp: , Rfl:  .  escitalopram (LEXAPRO) 20 MG tablet, Take 1 tablet (20 mg total) by mouth daily., Disp: 90 tablet, Rfl: 0 .  losartan (COZAAR) 100 MG tablet, Take 1 tablet (100 mg total) by mouth daily., Disp: 90 tablet, Rfl: 0 .  metFORMIN (GLUCOPHAGE) 500 MG tablet, Take 1 tablet (500 mg total) by mouth daily with breakfast., Disp: 90 tablet, Rfl: 0 .  ranitidine (ZANTAC) 300 MG tablet, Take 1 tablet (300 mg total) by mouth at bedtime., Disp: 90 tablet, Rfl: 0 .  sertraline (ZOLOFT) 25 MG tablet, Take 1 tablet (25 mg total) by mouth at bedtime., Disp: 30 tablet, Rfl: 2 .  simvastatin (ZOCOR) 40 MG tablet, Take 1 tablet (40 mg total) by mouth at bedtime. (Patient not taking: Reported on 07/07/2016), Disp: 90 tablet, Rfl: 0  Allergies  Allergen Reactions  . Propoxyphene      Review of Systems  Cardiovascular: Negative for chest pain.  Neurological: Positive for tingling and focal weakness.  Psychiatric/Behavioral: Positive for  depression. The patient is nervous/anxious.      Objective  Vitals:   07/07/16 1547  BP: (!) 144/78  Pulse: 97  Resp: 18  Temp: 98.1 F (36.7 C)  TempSrc: Oral  SpO2: 97%  Weight: 151 lb (68.5 kg)  Height: 6\' 1"  (1.854 m)    Physical Exam  Constitutional: He is oriented to person, place, and time and well-developed, well-nourished, and in no distress.  HENT:  Head: Normocephalic and atraumatic.  Cardiovascular: Normal rate and regular rhythm.   Pulmonary/Chest: Effort normal and breath sounds normal. He has no wheezes. He has no rhonchi.  Neurological: He is alert and oriented to person, place, and time. He has normal sensation and normal strength. He displays no weakness. He exhibits normal muscle tone.   Decreased sensation over the left dorsal distal foot, some difficulty with coordination.  Skin: Skin is warm and dry.  Nursing note and vitals reviewed.    Assessment & Plan  1. Acute ischemic stroke (HCC) On high-dose atorvastatin and Plavix. Obtain MRI of brain, referral to neurology. Has been scheduled for rehabilitation by [facility]. - atorvastatin (LIPITOR) 80 MG tablet; Take 1 tablet (80 mg total) by mouth daily.  Dispense: 90 tablet; Refill: 3 - clopidogrel (PLAVIX) 75 MG tablet; Take 1 tablet (75 mg total) by mouth daily.  Dispense: 90 tablet; Refill: 3 - MR Brain Wo Contrast; Future - Ambulatory referral to Neurology   Nahal Wanless [facility] 07/07/2016 4:11 PM

## 2016-07-08 ENCOUNTER — Telehealth: Payer: Self-pay | Admitting: Emergency Medicine

## 2016-07-08 NOTE — Telephone Encounter (Signed)
Dr. Manuella Ghazi I called this patient with his appointment for MRI and Neurology. He informed me he did not want either appointment. The MRI he had already called insurance and they will not pay. Patient stated he will make appointment with you to discuss

## 2016-07-08 NOTE — Telephone Encounter (Signed)
Contacted the patient and explained that he was diagnosed with acute ischemic stroke at [facility] and that he must obtain an MRI of brain to evaluate which area is affected by the infarct. Also important to follow up with the neurologist. Patient understands this but states that his insurance does not cover the MRI and that he is not in a position to see a specialist at this time. Please try to contact patient's insurance company to determine why are thery not covering an MRI of brain for this patient.

## 2016-07-12 NOTE — Telephone Encounter (Signed)
COMPLETE

## 2016-07-18 ENCOUNTER — Ambulatory Visit: Payer: BLUE CROSS/BLUE SHIELD

## 2016-07-18 ENCOUNTER — Telehealth: Payer: Self-pay | Admitting: Family Medicine

## 2016-07-18 NOTE — Telephone Encounter (Signed)
Prescription for Plavix has already been sent to pharmacy on 07/07/16

## 2016-07-19 ENCOUNTER — Other Ambulatory Visit: Payer: Self-pay | Admitting: Family Medicine

## 2016-07-19 DIAGNOSIS — I639 Cerebral infarction, unspecified: Secondary | ICD-10-CM

## 2016-07-19 NOTE — Telephone Encounter (Signed)
Pt informed

## 2016-07-22 ENCOUNTER — Encounter: Payer: Self-pay | Admitting: Occupational Therapy

## 2016-07-22 ENCOUNTER — Ambulatory Visit: Payer: BLUE CROSS/BLUE SHIELD | Attending: Nurse Practitioner | Admitting: Occupational Therapy

## 2016-07-22 DIAGNOSIS — R278 Other lack of coordination: Secondary | ICD-10-CM | POA: Diagnosis not present

## 2016-07-22 DIAGNOSIS — M6281 Muscle weakness (generalized): Secondary | ICD-10-CM | POA: Diagnosis not present

## 2016-07-24 ENCOUNTER — Other Ambulatory Visit: Payer: Self-pay | Admitting: Family Medicine

## 2016-07-24 DIAGNOSIS — I639 Cerebral infarction, unspecified: Secondary | ICD-10-CM

## 2016-07-25 ENCOUNTER — Encounter: Payer: Self-pay | Admitting: Occupational Therapy

## 2016-07-25 ENCOUNTER — Ambulatory Visit: Payer: BLUE CROSS/BLUE SHIELD | Admitting: Occupational Therapy

## 2016-07-25 DIAGNOSIS — R278 Other lack of coordination: Secondary | ICD-10-CM | POA: Diagnosis not present

## 2016-07-25 DIAGNOSIS — M6281 Muscle weakness (generalized): Secondary | ICD-10-CM | POA: Diagnosis not present

## 2016-07-25 NOTE — Therapy (Signed)
[place] MAIN [facility] SERVICES [address] [place], [place], [address] Phone: [phone]   Fax:  [phone]  Occupational Therapy Evaluation  Patient Details  Name: Kenneth Franklin MRN: [id number] Date of Birth: 20-Nov-1946 No Data Recorded  Encounter Date: 07/22/2016      OT End of Session - 07/25/16 0825    Visit Number 1   Number of Visits 12   Date for OT Re-Evaluation 09/06/16   Authorization Type Medicare G code 1   OT Start Time 0830   OT Stop Time 0931   OT Time Calculation (min) 61 min   Activity Tolerance Patient tolerated treatment well   Behavior During Therapy [facility] for tasks assessed/performed      Past Medical History:  Diagnosis Date  . Anxiety   . Depression   . Diabetes mellitus without complication ([place])   . Hyperlipidemia   . Hypertension     Past Surgical History:  Procedure Laterality Date  . CATARACT EXTRACTION Bilateral     There were no vitals filed for this visit.      Subjective Assessment - 07/25/16 0845    Subjective  Patient reports he is concerned about being on 3 medications for anxiety, recommend he call the physician to discuss concerns.  He agrees and reports he will call.  No pain noted this date.     Patient Stated Goals "Be back normal and do like I was" Go back to work.   Currently in Pain? No/denies   Multiple Pain Sites No           [facility] OT Assessment - 07/25/16 0825      Assessment   Diagnosis CVA with L sided weakness      Precautions   Precautions Fall     Balance Screen   Has the patient fallen in the past 6 months Yes   How many times? 2   Has the patient had a decrease in activity level because of a fear of falling?  No   Is the patient reluctant to leave their home because of a fear of falling?  No     Home  Environment   Family/patient expects to be discharged to: Private residence   Living Arrangements Alone   Available Help at Discharge Family   Type of  [place] One level   Bathroom Shower/Tub Walk-in Shower;Curtain   [place] - 2 wheels;Grab bars - toilet;Grab bars - tub/shower   Lives With Alone     Prior Function   Level of Independence Independent   Vocation Full time employment     ADL   Eating/Feeding Modified independent   Grooming Modified independent   Upper Body Bathing Modified independent   Lower Body Bathing Increased time   Upper Body Dressing Needs assist for fasteners;Increased time   Lower Body Dressing Needs assist for fasteners;Increased time   Toilet Tranfer Modified independent   Toileting - Clothing Manipulation Increased time   Tub/Shower Transfer Modified independent     IADL   Prior Level of Function Shopping independent   Shopping Takes care of all shopping needs independently   Prior Level of Function Light Housekeeping independent   Light Housekeeping Needs help with all home maintenance tasks   Prior Level of Function Meal Prep independent   Meal Prep Able to complete simple warm meal  prep   Community Mobility Drives own vehicle   Medication Management Is responsible for taking medication in correct dosages at correct time   Prior Level of Function Financial Management independent   Physiological scientist financial matters independently (budgets, writes checks, pays rent, bills goes to bank), collects and keeps track of income     Mobility   Mobility Status History of falls     Vision - History   Visual History Cataracts   Additional Comments cataracts removed, denies any changes in vision since stroke     Cognition   Overall Cognitive Status Within Functional Limits for tasks assessed     Sensation   Light Touch Appears Intact  some numbness and tingling on ulnar side of left hand     Coordination   Gross Motor Movements are Fluid and Coordinated No    Fine Motor Movements are Fluid and Coordinated No   Finger Nose Finger Test impaired   9 Hole Peg Test Right;Left   Right 9 Hole Peg Test 23   Left 9 Hole Peg Test 34   Coordination impaired     AROM   Overall AROM  Within functional limits for tasks performed   Overall AROM Comments Bilateral range of motion of upper extremities is within functional limits. Right shoulder has pain with patient rating 4/10 and reports arthritis. Left upper extremity he was affected by his stroke but no complaints of pain.     Strength   Overall Strength Deficits   Overall Strength Comments Strength right shoulder 4/5 for flexion with pain noted, elbow, wrist and hand 5/5.  Left upper extremity overall 4-/5 strength. 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     Hand Function   Right Hand Grip (lbs) 62   Right Hand Lateral Pinch 15 lbs   Right Hand 3 Point Pinch 10 lbs   Left Hand Grip (lbs) 40   Left Hand Lateral Pinch 8 lbs   Left 3 point pinch 10 lbs     Sensation Exercises   Stereognosis intact                         OT Education - 07/25/16 0851    Education provided Yes   Education Details HEP, coordination   Person(s) Educated Patient   Methods Explanation;Demonstration;Verbal cues   Comprehension Verbal cues required;Returned demonstration;Verbalized understanding             OT Long Term Goals - 07/25/16 0825      OT LONG TERM GOAL #1   Title Patient will demonstrate improvement in fine motor coordination as evidenced of nine hole peg test on the left by 10 seconds to be able to complete buttons independently.    Baseline 34 sec   Time 6   Period Weeks   Status New     OT LONG TERM GOAL #2   Title Patient will complete meal preparation with modified independence.    Baseline step daughter bringing in meals   Time 6   Period Weeks   Status New     OT LONG TERM GOAL #3   Title Patient will improve  left UE strength by 1 mm grade to return to work with modifications as needed   Time 6   Period Weeks   Status New     OT LONG TERM GOAL #4   Title Patient will be independent with home exercise program.  Baseline none   Time 6   Period Weeks   Status New     OT LONG TERM GOAL #5   Title Patient will be modified independent with all self care tasks   Baseline slow to complete, assist with buttons   Time 6   Status New               Plan - 07/25/16 0825    Clinical Impression Statement Patient is a 70 year old male who suffered a stroke with left side affected. Patient presents with left upper extremity weakness, decreased coordination and decreased ability to complete the necessary daily tasks including higher level homemaking skills and work tasks. Patient would benefit from skilled occupational therapy to address about the limitations and increase his independence in daily activities so that he can continue to live alone and return to work.    Rehab Potential Good   Clinical Impairments Affecting Rehab Potential positive:  motivation   OT Frequency 2x / week   OT Duration 6 weeks   OT Treatment/Interventions Self-care/ADL training;Therapeutic exercise;Neuromuscular education;Therapeutic exercises;Patient/family education;Functional Mobility Training;DME and/or AE instruction;Manual Therapy;Therapeutic activities;Balance training   Consulted and Agree with Plan of Care Patient      Patient will benefit from skilled therapeutic intervention in order to improve the following deficits and impairments:  Decreased knowledge of use of DME, Impaired flexibility, Pain, Decreased coordination, Decreased mobility, Decreased activity tolerance, Decreased endurance, Decreased range of motion, Decreased strength, Decreased balance, Difficulty walking, Impaired UE functional use  Visit Diagnosis: Muscle weakness (generalized)  Other lack of coordination    Problem List Patient  Active Problem List   Diagnosis Date Noted  . Acute ischemic stroke ([place]) 07/07/2016  . Tick bite of flank 06/16/2016  . Grief counseling 06/16/2016  . Fatigue 06/16/2016  . Absent pulse in lower extremity 09/14/2015  . Anxiety and depression 04/21/2015  . Diabetes mellitus type 2, controlled, without complications ([place]) 0000000  . Acid reflux 04/21/2015  . Calcium blood increased 04/21/2015  . Dyslipidemia 04/21/2015  . HLD (hyperlipidemia) 04/21/2015  . BP (high blood pressure) 04/21/2015   Natania Finigan T Tomasita Morrow, OTR/L, CLT  Bert Ptacek 07/25/2016, 3:46 PM  [place] MAIN [facility] SERVICES [address] [place], [place], [address] Phone: [phone]   Fax:  [phone]  Name: Kenneth Franklin MRN: [id number] Date of Birth: [birth year]

## 2016-07-27 ENCOUNTER — Ambulatory Visit: Payer: BLUE CROSS/BLUE SHIELD | Admitting: Occupational Therapy

## 2016-07-27 DIAGNOSIS — R278 Other lack of coordination: Secondary | ICD-10-CM

## 2016-07-27 DIAGNOSIS — M6281 Muscle weakness (generalized): Secondary | ICD-10-CM | POA: Diagnosis not present

## 2016-07-27 NOTE — Therapy (Signed)
[place] MAIN [facility] SERVICES [address] [place], [place], [address] Phone: [phone]   Fax:  [phone]  Occupational Therapy Treatment  Patient Details  Name: Kenneth Franklin MRN: [id number] Date of Birth: 1946/05/10 No Data Recorded  Encounter Date: 07/25/2016      OT End of Session - 07/27/16 2026    Visit Number 2   Number of Visits 12   Date for OT Re-Evaluation 09/06/16   Authorization Type Medicare G code 2   OT Start Time 0831   OT Stop Time 0915   OT Time Calculation (min) 44 min   Activity Tolerance Patient tolerated treatment well   Behavior During Therapy [facility] for tasks assessed/performed      Past Medical History:  Diagnosis Date  . Anxiety   . Depression   . Diabetes mellitus without complication ([place])   . Hyperlipidemia   . Hypertension     Past Surgical History:  Procedure Laterality Date  . CATARACT EXTRACTION Bilateral     There were no vitals filed for this visit.      Subjective Assessment - 07/27/16 2025    Subjective  Patient reports he is concerned about being on 3 medications for anxiety, recommend he call the physician to discuss concerns.  He agrees and reports he will call.  No pain noted this date.     Patient Stated Goals "Be back normal and do like I was" Go back to work.   Currently in Pain? No/denies   Multiple Pain Sites No                      OT Treatments/Exercises (OP) - 07/27/16 2033      Fine Motor Coordination   Other Fine Motor Exercises Patient seen this date for fine motor coordination with use of left hand for manipulation of grooved pegs, picking up from cup and placing into structured board with cues for patterns, removing and moving items to palm, using the hand for storage. Manipulation of small pieces of [place] with small dowels, washers and collars to pick up and assemble with cues for prehension patterns and isolated finger movements to complete task.       Neurological Re-education Exercises   Other Exercises 1 Patient seen for UBE strengthening from a seated position with resistance of 3.3 to 4.0 for 4 minutes, therapist in constant attendance to adjust resistance and ensure grip on the left.  Patient performing grip strengthening tasks with 17.9#  for 25 reps with left hand and then progressed to 23# for 25 reps, cues for technique for sustained gripping.       Sensation Exercises   Stereognosis intact                OT Education - 07/27/16 2025    Education provided Yes   Education Details HEP, strength/coordination for self care tasks    Person(s) Educated Patient   Methods Explanation;Demonstration;Tactile cues;Verbal cues   Comprehension Verbal cues required;Returned demonstration;Verbalized understanding             OT Long Term Goals - 07/25/16 0825      OT LONG TERM GOAL #1   Title Patient will demonstrate improvement in fine motor coordination as evidenced of nine hole peg test on the left by 10 seconds to be able to complete buttons independently.    Baseline 34 sec   Time 6   Period Weeks   Status New  OT LONG TERM GOAL #2   Title Patient will complete meal preparation with modified independence.    Baseline step daughter bringing in meals   Time 6   Period Weeks   Status New     OT LONG TERM GOAL #3   Title Patient will improve left UE strength by 1 mm grade to return to work with modifications as needed   Time 6   Period Weeks   Status New     OT LONG TERM GOAL #4   Title Patient will be independent with home exercise program.   Baseline none   Time 6   Period Weeks   Status New     OT LONG TERM GOAL #5   Title Patient will be modified independent with all self care tasks   Baseline slow to complete, assist with buttons   Time 6   Status New               Plan - 07/27/16 2026    Clinical Impression Statement Patient is waiting for his upcoming appointment with PT, reports  he feels his leg and balance was more affected by his CVA than his arm.  He is continuing to make progress with his left hand skills with picking up and manipulating objects.  Will continue to work towards skills to improve daily performance in self care, homemaking and potential return to work tasks.    Rehab Potential Good   Clinical Impairments Affecting Rehab Potential positive:  motivation   OT Frequency 2x / week   OT Duration 6 weeks   OT Treatment/Interventions Self-care/ADL training;Therapeutic exercise;Neuromuscular education;Therapeutic exercises;Patient/family education;Functional Mobility Training;DME and/or AE instruction;Manual Therapy;Therapeutic activities;Balance training   Consulted and Agree with Plan of Care Patient      Patient will benefit from skilled therapeutic intervention in order to improve the following deficits and impairments:  Decreased knowledge of use of DME, Impaired flexibility, Pain, Decreased coordination, Decreased mobility, Decreased activity tolerance, Decreased endurance, Decreased range of motion, Decreased strength, Decreased balance, Difficulty walking, Impaired UE functional use  Visit Diagnosis: Muscle weakness (generalized)  Other lack of coordination    Problem List Patient Active Problem List   Diagnosis Date Noted  . Acute ischemic stroke ([place]) 07/07/2016  . Tick bite of flank 06/16/2016  . Grief counseling 06/16/2016  . Fatigue 06/16/2016  . Absent pulse in lower extremity 09/14/2015  . Anxiety and depression 04/21/2015  . Diabetes mellitus type 2, controlled, without complications ([place]) 0000000  . Acid reflux 04/21/2015  . Calcium blood increased 04/21/2015  . Dyslipidemia 04/21/2015  . HLD (hyperlipidemia) 04/21/2015  . BP (high blood pressure) 04/21/2015   Izmael Duross T Earlyne Feeser, OTR/L, CLT  Jeana Kersting 07/27/2016, 8:40 PM  [place] MAIN [facility] SERVICES [address] [place], [place],  [address] Phone: [phone]   Fax:  [phone]  Name: SHIVIN MCLARTY MRN: [id number] Date of Birth: 09-10-1946

## 2016-07-28 ENCOUNTER — Encounter: Payer: Self-pay | Admitting: Occupational Therapy

## 2016-07-28 ENCOUNTER — Ambulatory Visit: Payer: Medicare Other | Admitting: Family Medicine

## 2016-07-28 ENCOUNTER — Encounter: Payer: Self-pay | Admitting: Family Medicine

## 2016-07-28 NOTE — Therapy (Signed)
[place] MAIN [facility] SERVICES [address] [place], [place], [address] Phone: [phone]   Fax:  [phone]  Occupational Therapy Treatment  Patient Details  Name: Kenneth Franklin MRN: [id number] Date of Birth: [birth year] No Data Recorded  Encounter Date: 07/27/2016      OT End of Session - 07/28/16 1640    Visit Number 3   Number of Visits 12   Date for OT Re-Evaluation 09/06/16   Authorization Type Medicare G code 3   OT Start Time 0830   OT Stop Time 0915   OT Time Calculation (min) 45 min   Activity Tolerance Patient tolerated treatment well   Behavior During Therapy [name] for tasks assessed/performed      Past Medical History:  Diagnosis Date  . Anxiety   . Depression   . Diabetes mellitus without complication ([place])   . Hyperlipidemia   . Hypertension     Past Surgical History:  Procedure Laterality Date  . CATARACT EXTRACTION Bilateral     There were no vitals filed for this visit.      Subjective Assessment - 07/28/16 1639    Subjective  Patient reports he is going to the doctor tomorrow about his medications and to get it straigthened out.     Pertinent History Patient reports on 06/26/2016 he was getting ready to go to his brothers house and went to get into his truck and was unable to use or pick up his left foot and could not move his arm. He drove 3 miles to his brothers house and His brother transported him to [facility] he was discharged from the ER and then went to [facility] and was hospitalized for 3 to 4 days before being discharged home. He has not received any therapy.  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   Patient Stated Goals "Be back normal and do like I was" Go back to work.   Currently in Pain? No/denies   Multiple Pain Sites No                      OT Treatments/Exercises (OP) - 07/28/16 2018      Fine Motor Coordination   Other Fine Motor Exercises Patient was seen  for fine motor coordination with manipulation of 100 pegboard flipping from one end to the other for each piece cues for finger movement and isolation. Also seen for manipulation of small washer and placing in elevated range of motion, vertical and diagonal patterns, verbal and tactile cues for performing one at a time.      Neurological Re-education Exercises   Other Exercises 1 Patient was seen for strengthening exercises with 2 pound dowel for overhead press, chest press, abduction, abduction, forward and backward circles for 10 repetitions for two sets, therapist demo and cues for technique. Hand strengthening with Resistive Theraputty Gross grasp, lateral pinch, three point pinch, and two point pinch. Red theraband exercises for shoulder flexion, diagonal patterns, elbow flex for 10 reps for 2 sets.                 OT Education - 07/28/16 1640    Education provided Yes   Education Details HEP, coordination   Person(s) Educated Patient   Methods Explanation;Demonstration;Verbal cues   Comprehension Verbal cues required;Returned demonstration;Verbalized understanding             OT Long Term Goals - 07/25/16 0825      OT LONG  TERM GOAL #1   Title Patient will demonstrate improvement in fine motor coordination as evidenced of nine hole peg test on the left by 10 seconds to be able to complete buttons independently.    Baseline 34 sec   Time 6   Period Weeks   Status New     OT LONG TERM GOAL #2   Title Patient will complete meal preparation with modified independence.    Baseline step daughter bringing in meals   Time 6   Period Weeks   Status New     OT LONG TERM GOAL #3   Title Patient will improve left UE strength by 1 mm grade to return to work with modifications as needed   Time 6   Period Weeks   Status New     OT LONG TERM GOAL #4   Title Patient will be independent with home exercise program.   Baseline none   Time 6   Period Weeks   Status New      OT LONG TERM GOAL #5   Title Patient will be modified independent with all self care tasks   Baseline slow to complete, assist with buttons   Time 6   Status New               Plan - 07/28/16 1641    Clinical Impression Statement Patient continues to make good progress with right upper extremity  range of motion, strength, and coordination. Patient is going to see his doctor tomorrow for a follow up appointment. Patient would like to be able to return to work however he needs to receive physical therapy to determine the needs for balance and gait. Continue to work towards improving independence in daily activities.    Rehab Potential Good   Clinical Impairments Affecting Rehab Potential positive:  motivation   OT Frequency 2x / week   OT Duration 6 weeks   OT Treatment/Interventions Self-care/ADL training;Therapeutic exercise;Neuromuscular education;Therapeutic exercises;Patient/family education;Functional Mobility Training;DME and/or AE instruction;Manual Therapy;Therapeutic activities;Balance training   Consulted and Agree with Plan of Care Patient      Patient will benefit from skilled therapeutic intervention in order to improve the following deficits and impairments:  Decreased knowledge of use of DME, Impaired flexibility, Pain, Decreased coordination, Decreased mobility, Decreased activity tolerance, Decreased endurance, Decreased range of motion, Decreased strength, Decreased balance, Difficulty walking, Impaired UE functional use  Visit Diagnosis: Muscle weakness (generalized)  Other lack of coordination    Problem List Patient Active Problem List   Diagnosis Date Noted  . Acute ischemic stroke ([place]) 07/07/2016  . Tick bite of flank 06/16/2016  . Grief counseling 06/16/2016  . Fatigue 06/16/2016  . Absent pulse in lower extremity 09/14/2015  . Anxiety and depression 04/21/2015  . Diabetes mellitus type 2, controlled, without complications ([place]) 0000000  . Acid  reflux 04/21/2015  . Calcium blood increased 04/21/2015  . Dyslipidemia 04/21/2015  . HLD (hyperlipidemia) 04/21/2015  . BP (high blood pressure) 04/21/2015   Marvin Maenza T Dejah Droessler, OTR/L, CLT  Shakiyla Kook 07/28/2016, 8:20 PM  [place] MAIN [facility] SERVICES [address] [place], [place], [address] Phone: [phone]   Fax:  [phone]  Name: KAILUM OUIMETTE MRN: [id number] Date of Birth: [birth year]

## 2016-08-02 ENCOUNTER — Encounter: Payer: Self-pay | Admitting: Occupational Therapy

## 2016-08-02 ENCOUNTER — Ambulatory Visit: Payer: BLUE CROSS/BLUE SHIELD | Attending: Nurse Practitioner | Admitting: Occupational Therapy

## 2016-08-02 DIAGNOSIS — M6281 Muscle weakness (generalized): Secondary | ICD-10-CM | POA: Insufficient documentation

## 2016-08-02 DIAGNOSIS — R278 Other lack of coordination: Secondary | ICD-10-CM | POA: Diagnosis not present

## 2016-08-02 NOTE — Progress Notes (Signed)
This encounter was created in error - please disregard.

## 2016-08-03 NOTE — Therapy (Signed)
[place] MAIN [facility] SERVICES [address] [place], [place], [address] Phone: [phone]   Fax:  [phone]  Occupational Therapy Treatment  Patient Details  Name: Kenneth Franklin MRN: [id number] Date of Birth: 06-29-1946 No Data Recorded  Encounter Date: 08/02/2016      OT End of Session - 08/02/16 1054    Visit Number 4   Number of Visits 12   Date for OT Re-Evaluation 09/06/16   Authorization Type Medicare G code 4   OT Start Time [phone]   OT Stop Time 0915   OT Time Calculation (min) 42 min   Activity Tolerance Patient tolerated treatment well   Behavior During Therapy [facility] for tasks assessed/performed      Past Medical History:  Diagnosis Date  . Anxiety   . Depression   . Diabetes mellitus without complication ([place])   . Hyperlipidemia   . Hypertension     Past Surgical History:  Procedure Laterality Date  . CATARACT EXTRACTION Bilateral     There were no vitals filed for this visit.                    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      Neurological Re-education Exercises   Other Exercises 1 Patient was seen for upper extremity strengthening with upper body ergometer for four minutes, two minutes forwards, two minutes backwards, with the resistance of 2.5 2.7 with therapist in constant attendance to ensure grip and adjust settings. Patient performing grip strength with left hand for sustained 17.9 pounds for 25 repetitions and 23.4 pounds for 25 repetitions with cues for technique, patient dropped six total of 50.  Supination/pronation with 1 pound hammer for 20 repetitions each, with two different hand placement settings, performed with cues and occasional guiding  from therapist.    Other Exercises 2 Red theraputty exercises for grip and pinch with cues from therapist.                 OT Education - 08/02/16 1053    Education provided Yes   Education Details red theraputty exs   Methods Explanation;Demonstration;Verbal cues   Comprehension Verbal cues required;Returned demonstration;Verbalized understanding             OT Long Term Goals - 07/25/16 0825      OT LONG TERM GOAL #1   Title Patient will demonstrate improvement in fine motor coordination as evidenced of nine hole peg test on the left by 10 seconds to be able to complete buttons independently.    Baseline 34 sec   Time 6   Period Weeks   Status New     OT LONG TERM GOAL #2   Title Patient will complete meal preparation with modified independence.    Baseline step daughter bringing in meals   Time 6   Period Weeks   Status New     OT LONG TERM GOAL #3   Title Patient will improve left UE strength by 1 mm grade to return to work with modifications as needed   Time 6   Period Weeks   Status New     OT LONG TERM GOAL #4   Title Patient will be independent  with home exercise program.   Baseline none   Time 6   Period Weeks   Status New     OT LONG TERM GOAL #5   Title Patient will be modified independent with all self care tasks   Baseline slow to complete, assist with buttons   Time 6   Status New               Plan - 08/02/16 1054    Clinical Impression Statement Patient continues to progress with upper extremity strengthening, range of motion, and use of left arm in daily tasks. He continues to complain of balance deficits and would benefit from physical therapy. He has an appointment with a physical therapist next week.   Rehab Potential Good   Clinical Impairments Affecting Rehab Potential positive:  motivation   OT Frequency 2x / week   OT Duration 6 weeks   OT Treatment/Interventions Self-care/ADL training;Therapeutic  exercise;Neuromuscular education;Therapeutic exercises;Patient/family education;Functional Mobility Training;DME and/or AE instruction;Manual Therapy;Therapeutic activities;Balance training   Consulted and Agree with Plan of Care Patient      Patient will benefit from skilled therapeutic intervention in order to improve the following deficits and impairments:  Decreased knowledge of use of DME, Impaired flexibility, Pain, Decreased coordination, Decreased mobility, Decreased activity tolerance, Decreased endurance, Decreased range of motion, Decreased strength, Decreased balance, Difficulty walking, Impaired UE functional use  Visit Diagnosis: Muscle weakness (generalized)  Other lack of coordination    Problem List Patient Active Problem List   Diagnosis Date Noted  . Erroneous encounter - disregard 08/02/2016  . Acute ischemic stroke ([place]) 07/07/2016  . Tick bite of flank 06/16/2016  . Grief counseling 06/16/2016  . Fatigue 06/16/2016  . Absent pulse in lower extremity 09/14/2015  . Anxiety and depression 04/21/2015  . Diabetes mellitus type 2, controlled, without complications ([place]) 0000000  . Acid reflux 04/21/2015  . Calcium blood increased 04/21/2015  . Dyslipidemia 04/21/2015  . HLD (hyperlipidemia) 04/21/2015  . BP (high blood pressure) 04/21/2015   Yarnell Kozloski T Lataya Varnell, OTR/L, CLT  Jackqulyn Mendel 08/03/2016, 4:44 PM  [place] MAIN [facility] SERVICES [address] [place], [place], [address] Phone: [phone]   Fax:  [phone]  Name: Kenneth Franklin MRN: [id number] Date of Birth: Aug 13, 1946

## 2016-08-04 ENCOUNTER — Encounter: Payer: Self-pay | Admitting: Occupational Therapy

## 2016-08-04 ENCOUNTER — Ambulatory Visit: Payer: BLUE CROSS/BLUE SHIELD | Admitting: Occupational Therapy

## 2016-08-04 DIAGNOSIS — R278 Other lack of coordination: Secondary | ICD-10-CM | POA: Diagnosis not present

## 2016-08-04 DIAGNOSIS — M6281 Muscle weakness (generalized): Secondary | ICD-10-CM

## 2016-08-05 NOTE — Therapy (Signed)
[place] MAIN [facility] SERVICES [address] [place], [place], [address] Phone: [phone]   Fax:  [phone]  Occupational Therapy Treatment  Patient Details  Name: Kenneth Franklin MRN: [id number] Date of Birth: 02-01-1946 No Data Recorded  Encounter Date: 08/04/2016      OT End of Session - 08/04/16 0848    Visit Number 5   Number of Visits 12   Date for OT Re-Evaluation 09/06/16   Authorization Type Medicare G code 5   OT Start Time 0830   OT Stop Time 0915   OT Time Calculation (min) 45 min      Past Medical History:  Diagnosis Date  . Anxiety   . Depression   . Diabetes mellitus without complication ([place])   . Hyperlipidemia   . Hypertension     Past Surgical History:  Procedure Laterality Date  . CATARACT EXTRACTION Bilateral     There were no vitals filed for this visit.      Subjective Assessment - 08/04/16 0845    Subjective  Patient reports he feels like therapy is helping, no pain in either arm this date.  Tried to work on his tractor yesterday from a seated position.     Patient Stated Goals "Be back normal and do like I was" Go back to work.   Currently in Pain? No/denies   Multiple Pain Sites No                      OT Treatments/Exercises (OP) - 08/04/16 0849      Neurological Re-education Exercises   Other Exercises 1 Patient seen forUB ROM and strengthening wall slides for left UE 15 reps for 2 sets, 2# weight for L shoulder flexion, chest press, elbow flexion/ext for 15 reps for 1 set, advanced to 3# for 15 reps for above exercises with therapist demo and cues for technique.  Grip strength for 25 reps with 17.9#,  23.4# for 25 reps with cues for sustained grip.      Other Exercises 2 Patient was seen for upper extremity strengthening with upper body ergometer for four minutes, two minutes forwards, two minutes backwards, with the resistance of 2.5 2.7 with therapist in constant attendance to  ensure grip and adjust settings.                OT Education - 08/04/16 0847    Education provided Yes   Education Details HEP, strengthening and coordination   Person(s) Educated Patient   Methods Explanation;Demonstration;Verbal cues   Comprehension Verbalized understanding;Returned demonstration;Verbal cues required             OT Long Term Goals - 07/25/16 0825      OT LONG TERM GOAL #1   Title Patient will demonstrate improvement in fine motor coordination as evidenced of nine hole peg test on the left by 10 seconds to be able to complete buttons independently.    Baseline 34 sec   Time 6   Period Weeks   Status New     OT LONG TERM GOAL #2   Title Patient will complete meal preparation with modified independence.    Baseline step daughter bringing in meals   Time 6   Period Weeks   Status New     OT LONG TERM GOAL #3   Title Patient will improve left UE strength by 1 mm grade to return to work with modifications as needed   Time 6  Period Weeks   Status New     OT LONG TERM GOAL #4   Title Patient will be independent with home exercise program.   Baseline none   Time 6   Period Weeks   Status New     OT LONG TERM GOAL #5   Title Patient will be modified independent with all self care tasks   Baseline slow to complete, assist with buttons   Time 6   Status New               Plan - 08/04/16 0848    Clinical Impression Statement Patient has been using left arm more frequently at home and in spontaneous tasks in the clinic.  He is starting to engage in additional tasks at home such as working on his [employer] fast streamer, even if performing from a seated position.  He has a PT evaluation next week to evaluate his balance and gait.  Will continue to work towards improving LUE strength, ROM and functional use for self care and return to work tasks.    Rehab Potential Good   Clinical Impairments Affecting Rehab Potential positive:  motivation    OT Frequency 2x / week   OT Duration 6 weeks   OT Treatment/Interventions Self-care/ADL training;Therapeutic exercise;Neuromuscular education;Therapeutic exercises;Patient/family education;Functional Mobility Training;DME and/or AE instruction;Manual Therapy;Therapeutic activities;Balance training   Consulted and Agree with Plan of Care Patient      Patient will benefit from skilled therapeutic intervention in order to improve the following deficits and impairments:  Decreased knowledge of use of DME, Impaired flexibility, Pain, Decreased coordination, Decreased mobility, Decreased activity tolerance, Decreased endurance, Decreased range of motion, Decreased strength, Decreased balance, Difficulty walking, Impaired UE functional use  Visit Diagnosis: Muscle weakness (generalized)  Other lack of coordination    Problem List Patient Active Problem List   Diagnosis Date Noted  . Erroneous encounter - disregard 08/02/2016  . Acute ischemic stroke ([place]) 07/07/2016  . Tick bite of flank 06/16/2016  . Grief counseling 06/16/2016  . Fatigue 06/16/2016  . Absent pulse in lower extremity 09/14/2015  . Anxiety and depression 04/21/2015  . Diabetes mellitus type 2, controlled, without complications ([place]) 0000000  . Acid reflux 04/21/2015  . Calcium blood increased 04/21/2015  . Dyslipidemia 04/21/2015  . HLD (hyperlipidemia) 04/21/2015  . BP (high blood pressure) 04/21/2015   Beverley Sherrard T Josh Nicolosi, OTR/L, CLT  Kenneth Franklin 08/05/2016, 10:42 AM  [place] MAIN [facility] SERVICES [place], [place], [address] Phone: [phone]   Fax:  [phone]  Name: Kenneth Franklin MRN: [id number] Date of Birth: 1946-07-19

## 2016-08-08 ENCOUNTER — Ambulatory Visit: Payer: BLUE CROSS/BLUE SHIELD | Admitting: Occupational Therapy

## 2016-08-09 DIAGNOSIS — F17200 Nicotine dependence, unspecified, uncomplicated: Secondary | ICD-10-CM | POA: Diagnosis not present

## 2016-08-09 DIAGNOSIS — Z6820 Body mass index (BMI) 20.0-20.9, adult: Secondary | ICD-10-CM | POA: Diagnosis not present

## 2016-08-09 DIAGNOSIS — F419 Anxiety disorder, unspecified: Secondary | ICD-10-CM | POA: Diagnosis not present

## 2016-08-09 DIAGNOSIS — E119 Type 2 diabetes mellitus without complications: Secondary | ICD-10-CM | POA: Diagnosis not present

## 2016-08-09 DIAGNOSIS — I639 Cerebral infarction, unspecified: Secondary | ICD-10-CM | POA: Diagnosis not present

## 2016-08-09 DIAGNOSIS — F329 Major depressive disorder, single episode, unspecified: Secondary | ICD-10-CM | POA: Diagnosis not present

## 2016-08-09 DIAGNOSIS — L989 Disorder of the skin and subcutaneous tissue, unspecified: Secondary | ICD-10-CM | POA: Diagnosis not present

## 2016-08-10 ENCOUNTER — Ambulatory Visit: Payer: BLUE CROSS/BLUE SHIELD | Admitting: Physical Therapy

## 2016-08-10 ENCOUNTER — Encounter: Payer: Self-pay | Admitting: Occupational Therapy

## 2016-08-10 ENCOUNTER — Encounter: Payer: Self-pay | Admitting: Physical Therapy

## 2016-08-10 ENCOUNTER — Ambulatory Visit: Payer: BLUE CROSS/BLUE SHIELD | Admitting: Occupational Therapy

## 2016-08-10 VITALS — BP 132/80 | HR 83

## 2016-08-10 DIAGNOSIS — M6281 Muscle weakness (generalized): Secondary | ICD-10-CM

## 2016-08-10 DIAGNOSIS — R278 Other lack of coordination: Secondary | ICD-10-CM

## 2016-08-10 NOTE — Patient Instructions (Addendum)
Alternating Step Up    *must be near a countertop or back of couch to grab a hold of if you were to lose your balance.  Alternating feet tap your foot up to step and bring back down.   Perform 20 reps.  Perform 3 times each day, 5 times each week.  Copyright  VHI. All rights reserved.  Single Leg - Eyes Open    Holding support, lift right leg while maintaining balance over other leg. Progress to removing hands from support surface for longer periods of time. Hold 30 seconds. Repeat 2 times per session. Do 3 sessions per day.  Do 5 times each week.  Copyright  VHI. All rights reserved.

## 2016-08-10 NOTE — Therapy (Signed)
[place] MAIN [facility] SERVICES [address] [place], [place], [address] Phone: [phone]   Fax:  [phone]  Physical Therapy Evaluation  Patient Details  Name: Kenneth Franklin MRN: [id number] Date of Birth: 05-Oct-1946 Referring Provider: Rogue Jury  Encounter Date: 08/10/2016      PT End of Session - 08/10/16 1200    Visit Number 1   Number of Visits 13   Date for PT Re-Evaluation 11-Oct-2016   Authorization Type g codes   Authorization Time Period 1/10   PT Start Time 0930   PT Stop Time 1030   PT Time Calculation (min) 60 min   Equipment Utilized During Treatment Gait belt   Activity Tolerance Patient tolerated treatment well   Behavior During Therapy [facility] for tasks assessed/performed      Past Medical History:  Diagnosis Date  . Anxiety   . Depression   . Diabetes mellitus without complication ([place])   . Hyperlipidemia   . Hypertension     Past Surgical History:  Procedure Laterality Date  . CATARACT EXTRACTION Bilateral     Vitals:   08/10/16 0933  BP: 132/80  Pulse: 83  SpO2: 98%         Subjective Assessment - 08/10/16 0955    Subjective CVA with residual LLE weakness, numbness, and impaired coordination.   Pertinent History On 7/30 LLE weakness and difficutly lifting LLE into car.  He went to ED at [facility] who completed CT which was negative and was instructed to follow up with his primary physician.  He remained concerned about symptoms and reports he went to [facility] where he was admitted for 3 days, it was there that he was told he had a CVA. He presents today with residual LLE weakness and impaired coordination. Uses quad cane in community but does not use AD in home.  Still has to pick up LLE to bring it into his car.  Continues to experience numbness LLE, his L knee buckles ~1x/wk.  Has had 3 falls in the past month, says it occurs when getting out of the shower which he recently renovated and has not had any falls  since.  Has a ramp at home so has not trialed ascending/descending steps.  Is concerned about climbing equipment ladders at work.  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  Job duties includes climbing up ladders, lifting up to 50#.  Currently out on disability until at least the end of September.  Denies any changes in vision since CVA, h/o cataract surgery when he was 70 years old.     How long can you walk comfortably? No issues with community ambulation, has not tested to see how far he can walk   Diagnostic tests +MRI for CVA   Patient Stated Goals to be able to complete job duties and return to work   Currently in Pain? No/denies            [facility] PT Assessment - 08/10/16 0944      Assessment   Medical Diagnosis CVA/LLE weakness   Referring Provider Rogue Jury   Onset Date/Surgical Date 06/26/16   Hand Dominance Right   Next MD Visit Saw PCP yesterday Kenneth Franklin) for regular check up   Prior Therapy No     Precautions   Precautions Fall     Restrictions  Weight Bearing Restrictions No     Balance Screen   Has the patient fallen in the past 6 months Yes   How many times? 3   Has the patient had a decrease in activity level because of a fear of falling?  No   Is the patient reluctant to leave their home because of a fear of falling?  No     Home Environment   Living Environment Private residence   Living Arrangements Alone   Available Help at Discharge Family;Friend(s);Available PRN/intermittently   Type of [place] One level   [place] - 2 wheels;Grab bars - tub/shower;Shower seat - built in   Additional Comments Pt had tub/shower unit but pt recently update this to a walk in shower and has not had any falls since     Prior Function   Level of Independence Requires assistive device  for independence;Needs assistance with homemaking  Stepdaughter cooks dinner for pt and assists with cleaning   Vocation On disability   Vocation Requirements lifting, climbing ladders   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  Is not able to do this currently due to fear of falling.     Cognition   Overall Cognitive Status Within Functional Limits for tasks assessed     Sensation   Light Touch Impaired by gross assessment   Additional Comments Decreased sensation L5/S1      Coordination   Gross Motor Movements are Fluid and Coordinated No   Fine Motor Movements are Fluid and Coordinated Not tested   Heel Shin Test Impaired coordination LLE     Posture/Postural Control   Posture/Postural Control Postural limitations   Postural Limitations Rounded Shoulders;Increased thoracic kyphosis;Flexed trunk     ROM / Strength   AROM / PROM / Strength Strength     Strength   Overall Strength Deficits   Strength Assessment Site Hip;Knee;Ankle   Right/Left Hip Right;Left   Right Hip Flexion 4/5   Right Hip External Rotation  5/5   Right Hip Internal Rotation 5/5   Right Hip ABduction 4/5   Right Hip ADduction 5/5   Left Hip Flexion 3/5   Left Hip External Rotation 4/5   Left Hip Internal Rotation 3/5   Left Hip ABduction 3/5   Left Hip ADduction 4/5   Right/Left Knee Right;Left   Right Knee Flexion 5/5   Right Knee Extension 5/5   Left Knee Flexion 3/5   Left Knee Extension 3+/5   Right/Left Ankle Right;Left   Right Ankle Dorsiflexion 5/5   Left Ankle Dorsiflexion 3/5     Ambulation/Gait   Ambulation/Gait Yes   Ambulation/Gait Assistance 6: Modified independent (Device/Increase time)   Assistive device Large base quad cane   Gait Pattern Step-through pattern;Decreased dorsiflexion - left;Decreased stride length;Trunk flexed;Antalgic;Shuffle   Ambulation Surface Level   Gait velocity decreased   Gait Comments Decreased L hip extension and DF  as pt shuffles L foot on floor at times.  Uses quad cane correctly.  Flexed posture.     Balance   Balance Assessed Yes     Standardized Balance Assessment   Standardized Balance Assessment Berg Balance Test;Five Times Sit to Stand;10 meter walk test   Five times sit to stand comments  18 sec   10 Meter Walk 0.72 m/s     Berg Balance Test   Sit to Stand Able to stand  without using hands and stabilize independently   Standing Unsupported Able to stand safely 2 minutes   Sitting with Back Unsupported but Feet Supported on Floor or Stool Able to sit safely and securely 2 minutes   Stand to Sit Sits safely with minimal use of hands   Transfers Able to transfer safely, minor use of hands   Standing Unsupported with Eyes Closed Able to stand 10 seconds safely   Standing Ubsupported with Feet Together Able to place feet together independently and stand for 1 minute with supervision   From Standing, Reach Forward with Outstretched Arm Loses balance while trying/requires external support   From Standing Position, Pick up Object from [place] to pick up shoe safely and easily   From Standing Position, Turn to Look Behind Over each Shoulder Looks behind one side only/other side shows less weight shift   Turn 360 Degrees Able to turn 360 degrees safely in 4 seconds or less   Standing Unsupported, Alternately Place Feet on Step/Stool Needs assistance to keep from falling or unable to try   Standing Unsupported, One Foot in [employer] balance while stepping or standing   Standing on One Leg Unable to try or needs assist to prevent fall   Total Score 38      Additional Evaluation Findings: Reflexes: +2 BLEs  TREATMENT Therapeutic Exercise: Single leg balance each LE, pt reaching out for // bars for support  Alternating toe taps up to 8" step, pt occasionally reaching out for // bars for support              PT Education - 08/10/16 1159    Education provided Yes   Education  Details role of PT; exercise technique; provided pt with HEP   Person(s) Educated Patient   Methods Explanation;Demonstration;Handout;Verbal cues   Comprehension Returned demonstration;Verbalized understanding             PT Long Term Goals - 08/10/16 1132      PT LONG TERM GOAL #1   Title Pt will improve Berg score by at least 5 points to demonstrate decreased risk for falls   Baseline 38/56   Time 6   Period Weeks   Status New     PT LONG TERM GOAL #2   Title Pt will improve 5xSTS time to 12 sec to demonstrate improved BLE strength   Baseline 18 sec   Time 6   Period Weeks   Status New     PT LONG TERM GOAL #3   Title Pt will improve 10MWT to at least 1.2 m/s to demonstrate improved gait speed   Baseline 0.72 m/s   Time 6   Period Weeks   Status New     PT LONG TERM GOAL #4   Title Pt will improve all areas of LLE strength with deficits on eval by at least 1 grade with MMT   Baseline See evaluation note for detailed report   Time 6   Period Weeks   Status New               Plan - 08/10/16 1120    Clinical Impression Statement Pt presents with residual LLE weakness, numbness, and impaired coordination following CVA 06/26/16.  He has a h/o 3 falls over the past month and a half.  Pt's score on his Berg, 10MWT, and 5xSTS indicate BLE weakness and increased risk for fall.  He will benefit from skilled PT interventions with goal of return to work and  improved functional ability.   Rehab Potential Good   Clinical Impairments Affecting Rehab Potential CVA 8/10 with residual symptoms   PT Frequency 2x / week   PT Duration 6 weeks   PT Treatment/Interventions ADLs/Self Care Home Management;Aquatic Therapy;Cryotherapy;Electrical Stimulation;Moist Heat;DME Instruction;Gait training;Stair training;Functional mobility training;Therapeutic activities;Therapeutic exercise;Balance training;Neuromuscular re-education;Patient/family education;Manual techniques;Passive range of  motion;Energy conservation;Taping   PT Next Visit Plan Provide pt with information on Healthcare Directives; Have pt complete ABC scale; Balance, strengthening, and coordination exercises   PT Home Exercise Plan Alternating toe taps, single leg balance   Recommended Other Services already receiving OT   Consulted and Agree with Plan of Care Patient      Patient will benefit from skilled therapeutic intervention in order to improve the following deficits and impairments:  Abnormal gait, Decreased balance, Decreased activity tolerance, Decreased coordination, Decreased endurance, Decreased knowledge of use of DME, Decreased safety awareness, Decreased strength, Difficulty walking, Impaired perceived functional ability, Impaired flexibility, Impaired sensation, Impaired UE functional use, Improper body mechanics, Postural dysfunction  Visit Diagnosis: Muscle weakness (generalized)  Other lack of coordination      G-Codes - 01-Sep-2016 1138    Functional Assessment Tool Used Berg, 10MWT, 5xSTS, MMT, Clinical Judgement   Functional Limitation Mobility: Walking and moving around   Mobility: Walking and Moving Around Current Status [phone]) At least 20 percent but less than 40 percent impaired, limited or restricted   Mobility: Walking and Moving Around Goal Status [phone]) At least 1 percent but less than 20 percent impaired, limited or restricted       Problem List Patient Active Problem List   Diagnosis Date Noted  . Erroneous encounter - disregard 08/02/2016  . Acute ischemic stroke ([place]) 07/07/2016  . Tick bite of flank 06/16/2016  . Grief counseling 06/16/2016  . Fatigue 06/16/2016  . Absent pulse in lower extremity 09/14/2015  . Anxiety and depression 04/21/2015  . Diabetes mellitus type 2, controlled, without complications ([place]) 0000000  . Acid reflux 04/21/2015  . Calcium blood increased 04/21/2015  . Dyslipidemia 04/21/2015  . HLD (hyperlipidemia) 04/21/2015  . BP (high  blood pressure) 04/21/2015    Collie Siad PT, DPT 09/01/2016, 12:10 PM  [place] MAIN [facility] SERVICES [address] [place], [place], [address] Phone: [phone]   Fax:  [phone]  Name: Kenneth Franklin MRN: [id number] Date of Birth: 1946/02/04

## 2016-08-10 NOTE — Therapy (Signed)
[place] MAIN [facility] SERVICES [address] [place], [place], [address] Phone: [phone]   Fax:  [phone]  Occupational Therapy Treatment  Patient Details  Name: Kenneth Franklin MRN: [id number] Date of Birth: 24-Oct-1946 No Data Recorded  Encounter Date: 08/10/2016      OT End of Session - 08/10/16 0843    Visit Number 6   Number of Visits 12   Date for OT Re-Evaluation 09/06/16   Authorization Type Medicare G code 6   OT Start Time [phone]   OT Stop Time 0915   OT Time Calculation (min) 43 min   Activity Tolerance Patient tolerated treatment well   Behavior During Therapy [facility] for tasks assessed/performed      Past Medical History:  Diagnosis Date  . Anxiety   . Depression   . Diabetes mellitus without complication ([place])   . Hyperlipidemia   . Hypertension     Past Surgical History:  Procedure Laterality Date  . CATARACT EXTRACTION Bilateral     There were no vitals filed for this visit.      Subjective Assessment - 08/10/16 0840    Subjective  Patient reports he didn't feel well on Monday and could not come for therapy.  Went to a new primary care physician yesterday and she will be looking into medications for anxiety and adjust as needed.    Patient Stated Goals "Be back normal and do like I was" Go back to work.   Currently in Pain? No/denies   Multiple Pain Sites No                      OT Treatments/Exercises (OP) - 08/10/16 [id number]      Fine Motor Coordination   Other Fine Motor Exercises Manipulation of 100 peg instructo board with left hand with cues for speed and dexterity.  Manipulation of coins from flat tabletop with cues to move towards palm of the hand, using hand for storage and translatory skills to return coins to fingertips and place into resistive bank with cues for technique.      Neurological Re-education Exercises   Other Exercises 1 Patient seen for UBE strengthening from a seated  position with resistance of 3.3 to 4.0 for 4 minutes, therapist in constant attendance to adjust resistance and ensure grip on the left. Patient performing grip strengthening tasks with 17.9# for 25 reps with left hand and then progressed to 23# for 25 reps, cues for technique for sustained gripping.    Other Exercises 2 Progressed to 5# dowel exercises for 12 reps for 2 sets each, shoulder flexion, ABD/ADD, chest press, elbow flexion/extension and forwards and backwards circles.  Therapist demo and verbal cues for technique.                 OT Education - 08/10/16 [phone]    Education provided Yes   Education Details HEP   Person(s) Educated Patient   Methods Explanation;Demonstration;Verbal cues   Comprehension Verbal cues required;Returned demonstration;Verbalized understanding             OT Long Term Goals - 07/25/16 0825      OT LONG TERM GOAL #1   Title Patient will demonstrate improvement in fine motor coordination as evidenced of nine hole peg test on the left by 10 seconds to be able to complete buttons independently.    Baseline 34 sec   Time 6   Period Weeks   Status New  OT LONG TERM GOAL #2   Title Patient will complete meal preparation with modified independence.    Baseline step daughter bringing in meals   Time 6   Period Weeks   Status New     OT LONG TERM GOAL #3   Title Patient will improve left UE strength by 1 mm grade to return to work with modifications as needed   Time 6   Period Weeks   Status New     OT LONG TERM GOAL #4   Title Patient will be independent with home exercise program.   Baseline none   Time 6   Period Weeks   Status New     OT LONG TERM GOAL #5   Title Patient will be modified independent with all self care tasks   Baseline slow to complete, assist with buttons   Time 6   Status New               Plan - 08/10/16 0843    Clinical Impression Statement Patient has continued to make progress with speed,  dexeterity, coordination skills and use of left UE in daily tasks.  He does fatigue quickly and requires frequent rest breaks during strengthening exercises. Will continue to work towards skills to improve his UB strength, ROM, and coordination to return to independence in all daily tasks including work related tasks.    Rehab Potential Good   Clinical Impairments Affecting Rehab Potential positive:  motivation   OT Frequency 2x / week   OT Duration 6 weeks   OT Treatment/Interventions Self-care/ADL training;Therapeutic exercise;Neuromuscular education;Therapeutic exercises;Patient/family education;Functional Mobility Training;DME and/or AE instruction;Manual Therapy;Therapeutic activities;Balance training   Consulted and Agree with Plan of Care Patient      Patient will benefit from skilled therapeutic intervention in order to improve the following deficits and impairments:  Decreased knowledge of use of DME, Impaired flexibility, Pain, Decreased coordination, Decreased mobility, Decreased activity tolerance, Decreased endurance, Decreased range of motion, Decreased strength, Decreased balance, Difficulty walking, Impaired UE functional use  Visit Diagnosis: Muscle weakness (generalized)  Other lack of coordination    Problem List Patient Active Problem List   Diagnosis Date Noted  . Erroneous encounter - disregard 08/02/2016  . Acute ischemic stroke ([place]) 07/07/2016  . Tick bite of flank 06/16/2016  . Grief counseling 06/16/2016  . Fatigue 06/16/2016  . Absent pulse in lower extremity 09/14/2015  . Anxiety and depression 04/21/2015  . Diabetes mellitus type 2, controlled, without complications ([place]) 0000000  . Acid reflux 04/21/2015  . Calcium blood increased 04/21/2015  . Dyslipidemia 04/21/2015  . HLD (hyperlipidemia) 04/21/2015  . BP (high blood pressure) 04/21/2015   Kenneth Franklin, OTR/L, CLT  Kenneth Franklin 08/10/2016, 3:07 PM  [place] MAIN [facility] SERVICES [address] [place], [place], [address] Phone: [phone]   Fax:  [phone]  Name: Kenneth Franklin MRN: [id number] Date of Birth: May 10, 1946

## 2016-08-11 ENCOUNTER — Ambulatory Visit: Payer: Medicare Other | Admitting: Family Medicine

## 2016-08-13 ENCOUNTER — Other Ambulatory Visit: Payer: Self-pay | Admitting: Family Medicine

## 2016-08-13 DIAGNOSIS — I10 Essential (primary) hypertension: Secondary | ICD-10-CM

## 2016-08-13 DIAGNOSIS — F32A Depression, unspecified: Secondary | ICD-10-CM

## 2016-08-13 DIAGNOSIS — E119 Type 2 diabetes mellitus without complications: Secondary | ICD-10-CM

## 2016-08-13 DIAGNOSIS — F419 Anxiety disorder, unspecified: Secondary | ICD-10-CM

## 2016-08-13 DIAGNOSIS — F329 Major depressive disorder, single episode, unspecified: Secondary | ICD-10-CM

## 2016-08-13 DIAGNOSIS — K219 Gastro-esophageal reflux disease without esophagitis: Secondary | ICD-10-CM

## 2016-08-13 DIAGNOSIS — E785 Hyperlipidemia, unspecified: Secondary | ICD-10-CM

## 2016-08-15 ENCOUNTER — Ambulatory Visit: Payer: BLUE CROSS/BLUE SHIELD

## 2016-08-15 ENCOUNTER — Encounter: Payer: BLUE CROSS/BLUE SHIELD | Admitting: Occupational Therapy

## 2016-08-16 ENCOUNTER — Ambulatory Visit: Payer: BLUE CROSS/BLUE SHIELD | Admitting: Physical Therapy

## 2016-08-16 ENCOUNTER — Ambulatory Visit: Payer: BLUE CROSS/BLUE SHIELD | Admitting: Occupational Therapy

## 2016-08-17 DIAGNOSIS — R1030 Lower abdominal pain, unspecified: Secondary | ICD-10-CM | POA: Diagnosis not present

## 2016-08-17 DIAGNOSIS — Z6820 Body mass index (BMI) 20.0-20.9, adult: Secondary | ICD-10-CM | POA: Diagnosis not present

## 2016-08-18 ENCOUNTER — Ambulatory Visit: Payer: BLUE CROSS/BLUE SHIELD | Admitting: Physical Therapy

## 2016-08-18 ENCOUNTER — Ambulatory Visit: Payer: BLUE CROSS/BLUE SHIELD | Admitting: Occupational Therapy

## 2016-08-22 ENCOUNTER — Ambulatory Visit: Payer: BLUE CROSS/BLUE SHIELD | Admitting: Occupational Therapy

## 2016-08-22 ENCOUNTER — Ambulatory Visit: Payer: BLUE CROSS/BLUE SHIELD

## 2016-08-22 ENCOUNTER — Encounter: Payer: Self-pay | Admitting: Occupational Therapy

## 2016-08-22 VITALS — BP 160/96 | HR 83

## 2016-08-22 DIAGNOSIS — M6281 Muscle weakness (generalized): Secondary | ICD-10-CM

## 2016-08-22 DIAGNOSIS — R278 Other lack of coordination: Secondary | ICD-10-CM

## 2016-08-22 NOTE — Therapy (Signed)
[place] MAIN [facility] SERVICES [address] [place], [place], [address] Phone: [phone]   Fax:  [phone]  Occupational Therapy Treatment  Patient Details  Name: Kenneth Franklin MRN: [id number] Date of Birth: Feb 14, 1946 No Data Recorded  Encounter Date: 08/22/2016      OT End of Session - 08/22/16 0913    Visit Number 7   Number of Visits 12   Date for OT Re-Evaluation 09/06/16   Authorization Type Medicare G code 7   OT Start Time 0845   OT Stop Time 0930   OT Time Calculation (min) 45 min   Activity Tolerance Patient tolerated treatment well   Behavior During Therapy [facility] for tasks assessed/performed      Past Medical History:  Diagnosis Date  . Anxiety   . Depression   . Diabetes mellitus without complication ([place])   . Hyperlipidemia   . Hypertension     Past Surgical History:  Procedure Laterality Date  . CATARACT EXTRACTION Bilateral     There were no vitals filed for this visit.      Subjective Assessment - 08/22/16 0908    Subjective  Pt. reports he is feeling better this morning. Pt. reports being tired.   Pertinent History Patient reports on 06/26/2016 he was getting ready to go to his brothers house and went to get into his truck and was unable to use or pick up his left foot and could not move his arm. He drove 3 miles to his brothers house and His brother transported him to [facility] he was discharged from the ER and then went to [facility] and was hospitalized for 3 to 4 days before being discharged home. He has not received any therapy.  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   Patient Stated Goals "Be back normal and do like I was" Go back to work.   Currently in Pain? No/denies         OT TREATMENT    Neuro muscular re-education:  Pt. Worked on grasping, flipping, and stacking instructo pegs with increasing speed, and control. Pt. performed [facility] tasks using the Grooved pegboard. Pt.  worked on grasping the grooved pegs from a horizontal position, and moving the pegs to a vertical position in the hand to prepare for placing them in the grooved slot. Pt. Worked on thumb opposition, and grasping and storing pegs when removing them.  Therapeutic Exercise:  Pt. Worked on the [employer] for 8 min. With constant monitoring of the BUEs. Pt. Worked on changing, and alternating forward reverse position every 2 min. Multiple rest breaks were required.Pt. Tolerated level 3.5 initially, and modified to 3.0  Pt. performed gross gripping with grip strengthener. Pt. worked on sustaining grip while grasping pegs and reaching at various heights. Gripper was placed in the 3rd resistive slot with the white resistive spring. Pt. Worked on pinch strengthening in the left hand for lateral, and 3pt. pinch using yellow, red, green, blue, and black resistive clips. Pt. worked on placing the clips at various vertical and horizontal angles. Tactile and verbal cues were required for eliciting the desired movement.                         OT Education - 08/22/16 0913    Education provided Yes   Education Details LUE strength, and coordination, hand movements.   Person(s) Educated Patient   Methods Explanation;Demonstration   Comprehension Returned demonstration;Verbalized understanding  OT Long Term Goals - 07/25/16 0825      OT LONG TERM GOAL #1   Title Patient will demonstrate improvement in fine motor coordination as evidenced of nine hole peg test on the left by 10 seconds to be able to complete buttons independently.    Baseline 34 sec   Time 6   Period Weeks   Status New     OT LONG TERM GOAL #2   Title Patient will complete meal preparation with modified independence.    Baseline step daughter bringing in meals   Time 6   Period Weeks   Status New     OT LONG TERM GOAL #3   Title Patient will improve left UE strength by 1 mm grade to return to work with  modifications as needed   Time 6   Period Weeks   Status New     OT LONG TERM GOAL #4   Title Patient will be independent with home exercise program.   Baseline none   Time 6   Period Weeks   Status New     OT LONG TERM GOAL #5   Title Patient will be modified independent with all self care tasks   Baseline slow to complete, assist with buttons   Time 6   Status New               Plan - 08/22/16 0914    Clinical Impression Statement Pt. is improving with [facility] skills. Pt. continues to fatigue with ther. Ex. Requiring multiple rest breaks. Pt. continues to benefit from skilled OT services to work on UE strengthening, activity tolerance, and [facility] skills in order to able to improve, and complete functioning.   Rehab Potential Good   Clinical Impairments Affecting Rehab Potential positive:  motivation   OT Frequency 2x / week   OT Duration 6 weeks   OT Treatment/Interventions Self-care/ADL training;Therapeutic exercise;Neuromuscular education;Therapeutic exercises;Patient/family education;Functional Mobility Training;DME and/or AE instruction;Manual Therapy;Therapeutic activities;Balance training   Consulted and Agree with Plan of Care Patient      Patient will benefit from skilled therapeutic intervention in order to improve the following deficits and impairments:  Decreased knowledge of use of DME, Impaired flexibility, Pain, Decreased coordination, Decreased mobility, Decreased activity tolerance, Decreased endurance, Decreased range of motion, Decreased strength, Decreased balance, Difficulty walking, Impaired UE functional use  Visit Diagnosis: Other lack of coordination  Muscle weakness (generalized)    Problem List Patient Active Problem List   Diagnosis Date Noted  . Erroneous encounter - disregard 08/02/2016  . Acute ischemic stroke ([place]) 07/07/2016  . Tick bite of flank 06/16/2016  . Grief counseling 06/16/2016  . Fatigue 06/16/2016  . Absent pulse in lower  extremity 09/14/2015  . Anxiety and depression 04/21/2015  . Diabetes mellitus type 2, controlled, without complications ([place]) 0000000  . Acid reflux 04/21/2015  . Calcium blood increased 04/21/2015  . Dyslipidemia 04/21/2015  . HLD (hyperlipidemia) 04/21/2015  . BP (high blood pressure) 04/21/2015    Harrel Carina, MS, OTR/L 08/22/2016, 8:48 PM  [place] MAIN [facility] SERVICES [address] [place], [place], [address] Phone: [phone]   Fax:  [phone]  Name: Kenneth Franklin MRN: [id number] Date of Birth: 1946/05/08

## 2016-08-22 NOTE — Therapy (Signed)
[place] MAIN [facility] SERVICES [address] [place], [place], [address] Phone: [phone]   Fax:  [phone]  Physical Therapy Treatment  Patient Details  Name: Kenneth Franklin MRN: [id number] Date of Birth: [birth year] Referring Provider: Rogue Jury  Encounter Date: 08/22/2016      PT End of Session - 08/22/16 1334    Visit Number 2   Number of Visits 13   Date for PT Re-Evaluation 2016/10/21   Authorization Type g codes   Authorization Time Period 2/10   Authorization - Visit Number --   Authorization - Number of Visits --   PT Start Time 0805   PT Stop Time 0845   PT Time Calculation (min) 40 min   Equipment Utilized During Treatment Gait belt   Activity Tolerance Patient tolerated treatment well   Behavior During Therapy [facility] for tasks assessed/performed      Past Medical History:  Diagnosis Date  . Anxiety   . Depression   . Diabetes mellitus without complication ([place])   . Hyperlipidemia   . Hypertension     Past Surgical History:  Procedure Laterality Date  . CATARACT EXTRACTION Bilateral     Vitals:   08/22/16 0811  BP: (!) 160/96  Pulse: 83        Subjective Assessment - 08/22/16 0811    Subjective Patient reports having a  fall two weekends ago when arising from sitting to standing. Pt reports his knee "gave way" and fell down backwards. Reports he hit his "tailbone" and it was sore. States he did not take BP medication this morning but plans on taking it after treatment.    Pertinent History On 7/30 LLE weakness and difficutly lifting LLE into car.  He went to ED at [facility] who completed CT which was negative and was instructed to follow up with his primary physician.  He remained concerned about symptoms and reports he went to [facility] where he was admitted for 3 days, it was there that he was told he had a CVA. He presents today with residual LLE weakness and impaired coordination. Uses quad cane in community but does  not use AD in home.  Still has to pick up LLE to bring it into his car.  Continues to experience numbness LLE, his L knee buckles ~1x/wk.  Has had 3 falls in the past month, says it occurs when getting out of the shower which he recently renovated and has not had any falls since.  Has a ramp at home so has not trialed ascending/descending steps.  Is concerned about climbing equipment ladders at work.  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  Job duties includes climbing up ladders, lifting up to 50#.  Currently out on disability until at least the end of September.  Denies any changes in vision since CVA, h/o cataract surgery when he was 70 years old.     How long can you walk comfortably? No issues with community ambulation, has not tested to see how far he can walk   Diagnostic tests +MRI for CVA   Patient Stated Goals to be able to complete job duties and return to work   Currently in Pain? No/denies     TREATMENT Therapeutic Exercise: Leg Press at the machine - x 10,x 20 60#; x15 15# Step ups onto airex pad - x10 bilaterally  Requiring UE support to perform Alternating toe taps up to 8" step x10, pt occasionally reaching out for // bars for support Sit to stands with airex on chair - x10, x15 without use of arms (Airex placed over chair to improve ease of movement)  Neuromuscular re-ed Standing airex pad feet together head turns 15 each side (up/down, left/right) - Required UE support after horizontal head turns Single leg balance each LE, pt reaching out for // bars for support - 3 x 10sec Standing shortened tandem stance on the airex - 20sec x 2 bilaterally Side stepping on airex beam - x 5 down and back          PT Education - 08/22/16 1333    Education provided Yes   Education Details Educated on proper form and technique for exercises    Person(s) Educated Patient    Methods Explanation;Demonstration   Comprehension Verbalized understanding;Returned demonstration             PT Long Term Goals - 08/10/16 1132      PT LONG TERM GOAL #1   Title Pt will improve Berg score by at least 5 points to demonstrate decreased risk for falls   Baseline 38/56   Time 6   Period Weeks   Status New     PT LONG TERM GOAL #2   Title Pt will improve 5xSTS time to 12 sec to demonstrate improved BLE strength   Baseline 18 sec   Time 6   Period Weeks   Status New     PT LONG TERM GOAL #3   Title Pt will improve 10MWT to at least 1.2 m/s to demonstrate improved gait speed   Baseline 0.72 m/s   Time 6   Period Weeks   Status New     PT LONG TERM GOAL #4   Title Pt will improve all areas of LLE strength with deficits on eval by at least 1 grade with MMT   Baseline See evaluation note for detailed report   Time 6   Period Weeks   Status New               Plan - 08/22/16 1338    Clinical Impression Statement Patient requires UE support and demonstrates postural sway when performing balancing exercises indicating decreased static and dynamic balance. Patient demonstrates decreased muscular strength as indicated by difficulty with leg press and sit to stand exercise and patient will benefit from further skilled therapy to return to prior level of function.    Rehab Potential Good   Clinical Impairments Affecting Rehab Potential CVA 8/10 with residual symptoms   PT Frequency 2x / week   PT Duration 6 weeks   PT Treatment/Interventions ADLs/Self Care Home Management;Aquatic Therapy;Cryotherapy;Electrical Stimulation;Moist Heat;DME Instruction;Gait training;Stair training;Functional mobility training;Therapeutic activities;Therapeutic exercise;Balance training;Neuromuscular re-education;Patient/family education;Manual techniques;Passive range of motion;Energy conservation;Taping   PT Next Visit Plan Provide pt with information on Healthcare Directives;  Have pt complete ABC scale; Balance, strengthening, and coordination exercises   PT Home Exercise Plan Alternating toe taps, single leg balance   Consulted and Agree with Plan of Care Patient      Patient will benefit from skilled therapeutic intervention in order to improve the following deficits and impairments:  Abnormal gait, Decreased balance, Decreased activity tolerance, Decreased coordination, Decreased endurance, Decreased knowledge of use of DME, Decreased safety awareness, Decreased strength, Difficulty walking, Impaired perceived functional ability, Impaired flexibility, Impaired sensation, Impaired UE functional use, Improper body mechanics, Postural dysfunction  Visit  Diagnosis: Muscle weakness (generalized)  Other lack of coordination     Problem List Patient Active Problem List   Diagnosis Date Noted  . Erroneous encounter - disregard 08/02/2016  . Acute ischemic stroke ([place]) 07/07/2016  . Tick bite of flank 06/16/2016  . Grief counseling 06/16/2016  . Fatigue 06/16/2016  . Absent pulse in lower extremity 09/14/2015  . Anxiety and depression 04/21/2015  . Diabetes mellitus type 2, controlled, without complications ([place]) 0000000  . Acid reflux 04/21/2015  . Calcium blood increased 04/21/2015  . Dyslipidemia 04/21/2015  . HLD (hyperlipidemia) 04/21/2015  . BP (high blood pressure) 04/21/2015    Blythe Stanford, DPT PT 08/22/2016, 5:09 PM  [place] MAIN [facility] SERVICES [address] [place], [place], [address] Phone: [phone]   Fax:  [phone]  Name: JAYSHAUN ARA MRN: [id number] Date of Birth: Jul 04, 1946

## 2016-08-22 NOTE — Patient Instructions (Addendum)
OT TREATMENT    Neuro muscular re-education:  Pt. Worked on grasping, flipping, and stacking instructo pegs with increasing speed, and control. Pt. performed [facility] tasks using the Grooved pegboard. Pt. worked on grasping the grooved pegs from a horizontal position, and moving the pegs to a vertical position in the hand to prepare for placing them in the grooved slot. Pt. Worked on thumb opposition, and grasping and storing pegs when removing them.  Therapeutic Exercise:  Pt. Worked on the [employer] for 8 min. With constant monitoring of the BUEs. Pt. Worked on changing, and alternating forward reverse position every 2 min. Multiple rest breaks were required.Pt. Tolerated level 3.5 initially, and modified to 3.0  Pt. performed gross gripping with grip strengthener. Pt. worked on sustaining grip while grasping pegs and reaching at various heights. Gripper was placed in the 3rd resistive slot with the white resistive spring. Pt. Worked on pinch strengthening in the left hand for lateral, and 3pt. pinch using yellow, red, green, blue, and black resistive clips. Pt. worked on placing the clips at various vertical and horizontal angles. Tactile and verbal cues were required for eliciting the desired movement.

## 2016-08-24 ENCOUNTER — Ambulatory Visit: Payer: Medicare Other | Admitting: Family Medicine

## 2016-08-24 ENCOUNTER — Ambulatory Visit: Payer: BLUE CROSS/BLUE SHIELD | Admitting: Occupational Therapy

## 2016-08-24 ENCOUNTER — Ambulatory Visit: Payer: BLUE CROSS/BLUE SHIELD

## 2016-08-24 ENCOUNTER — Encounter: Payer: Self-pay | Admitting: Occupational Therapy

## 2016-08-24 VITALS — BP 167/86 | HR 86

## 2016-08-24 DIAGNOSIS — M6281 Muscle weakness (generalized): Secondary | ICD-10-CM

## 2016-08-24 DIAGNOSIS — R278 Other lack of coordination: Secondary | ICD-10-CM

## 2016-08-24 NOTE — Therapy (Signed)
[place] MAIN [facility] SERVICES [address] [place], [place], [address] Phone: [phone]   Fax:  [phone]  Physical Therapy Treatment  Patient Details  Name: Kenneth Franklin MRN: [id number] Date of Birth: 1946/05/07 Referring Provider: Rogue Jury  Encounter Date: 08/24/2016      PT End of Session - 08/24/16 0812    Visit Number 3   Number of Visits 13   Date for PT Re-Evaluation 2016-09-30   Authorization Type g codes   Authorization Time Period 3/10   PT Start Time 0802   PT Stop Time 0845   PT Time Calculation (min) 43 min   Equipment Utilized During Treatment Gait belt   Activity Tolerance Patient tolerated treatment well   Behavior During Therapy [facility] for tasks assessed/performed      Past Medical History:  Diagnosis Date  . Anxiety   . Depression   . Diabetes mellitus without complication ([place])   . Hyperlipidemia   . Hypertension     Past Surgical History:  Procedure Laterality Date  . CATARACT EXTRACTION Bilateral     Vitals:   08/24/16 0810  BP: (!) 167/86  Pulse: 86        Subjective Assessment - 08/24/16 0807    Subjective Patient states he's concerned with returning to work and does not feel he he would yet be able to perform his occupational duties.    Pertinent History On 7/30 LLE weakness and difficutly lifting LLE into car.  He went to ED at [facility] who completed CT which was negative and was instructed to follow up with his primary physician.  He remained concerned about symptoms and reports he went to [facility] where he was admitted for 3 days, it was there that he was told he had a CVA. He presents today with residual LLE weakness and impaired coordination. Uses quad cane in community but does not use AD in home.  Still has to pick up LLE to bring it into his car.  Continues to experience numbness LLE, his L knee buckles ~1x/wk.  Has had 3 falls in the past month, says it occurs when getting out of the shower  which he recently renovated and has not had any falls since.  Has a ramp at home so has not trialed ascending/descending steps.  Is concerned about climbing equipment ladders at work.  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  Job duties includes climbing up ladders, lifting up to 50#.  Currently out on disability until at least the end of September.  Denies any changes in vision since CVA, h/o cataract surgery when he was 70 years old.     How long can you walk comfortably? No issues with community ambulation, has not tested to see how far he can walk   Diagnostic tests +MRI for CVA   Patient Stated Goals to be able to complete job duties and return to work      TREATMENT Therapeutic Exercise: Leg Press at the machine - 3x10 75#; 3 x 10 #45 with LLE only Step ups onto 6" step - 2 x15 leading with L foot (intermittent UE support needed);  Alternating toe taps up from airex to 6" step 2x10, pt occasionally reaching out for // bars for support Sit to stands with airex on chair -  2x15 without use of arms (Airex placed  over chair to improve ease of movement)   Neuromuscular re-ed Standing airex pad feet together head turns 15 each side (up/down, left/right, EC) Single leg balance each LE, pt reaching out for // bars for support - 3 x 15sec Standing shortened tandem stance on the airex - 20sec x 2 bilaterally Side stepping on airex beam - x 5 down and back            PT Education - 08/24/16 0811    Education provided Yes   Education Details Educated on proper form and technique during exercises   Person(s) Educated Patient   Methods Explanation;Demonstration   Comprehension Verbalized understanding;Returned demonstration             PT Long Term Goals - 08/10/16 1132      PT LONG TERM GOAL #1   Title Pt will improve Berg score by at least 5 points to demonstrate  decreased risk for falls   Baseline 38/56   Time 6   Period Weeks   Status New     PT LONG TERM GOAL #2   Title Pt will improve 5xSTS time to 12 sec to demonstrate improved BLE strength   Baseline 18 sec   Time 6   Period Weeks   Status New     PT LONG TERM GOAL #3   Title Pt will improve 10MWT to at least 1.2 m/s to demonstrate improved gait speed   Baseline 0.72 m/s   Time 6   Period Weeks   Status New     PT LONG TERM GOAL #4   Title Pt will improve all areas of LLE strength with deficits on eval by at least 1 grade with MMT   Baseline See evaluation note for detailed report   Time 6   Period Weeks   Status New               Plan - 08/24/16 [id number]    Clinical Impression Statement Patient required less UE support today versus previous visits indicating functional carryover between visits. Although patient is improving, he continues to demonstrate decreased weakness and demonstrates postural sway with exercises and will benefit from further skilled therapy.   Rehab Potential Good   Clinical Impairments Affecting Rehab Potential CVA 8/10 with residual symptoms   PT Frequency 2x / week   PT Duration 6 weeks   PT Treatment/Interventions ADLs/Self Care Home Management;Aquatic Therapy;Cryotherapy;Electrical Stimulation;Moist Heat;DME Instruction;Gait training;Stair training;Functional mobility training;Therapeutic activities;Therapeutic exercise;Balance training;Neuromuscular re-education;Patient/family education;Manual techniques;Passive range of motion;Energy conservation;Taping   PT Next Visit Plan Provide pt with information on Healthcare Directives; Have pt complete ABC scale; Balance, strengthening, and coordination exercises   PT Home Exercise Plan Alternating toe taps, single leg balance   Consulted and Agree with Plan of Care Patient      Patient will benefit from skilled therapeutic intervention in order to improve the following deficits and impairments:  Abnormal  gait, Decreased balance, Decreased activity tolerance, Decreased coordination, Decreased endurance, Decreased knowledge of use of DME, Decreased safety awareness, Decreased strength, Difficulty walking, Impaired perceived functional ability, Impaired flexibility, Impaired sensation, Impaired UE functional use, Improper body mechanics, Postural dysfunction  Visit Diagnosis: Other lack of coordination  Muscle weakness (generalized)     Problem List Patient Active Problem List   Diagnosis Date Noted  . Erroneous encounter - disregard 08/02/2016  . Acute ischemic stroke ([place]) 07/07/2016  . Tick bite of flank 06/16/2016  . Grief counseling 06/16/2016  . Fatigue 06/16/2016  . Absent  pulse in lower extremity 09/14/2015  . Anxiety and depression 04/21/2015  . Diabetes mellitus type 2, controlled, without complications ([place]) 0000000  . Acid reflux 04/21/2015  . Calcium blood increased 04/21/2015  . Dyslipidemia 04/21/2015  . HLD (hyperlipidemia) 04/21/2015  . BP (high blood pressure) 04/21/2015    Blythe Stanford, PT DPT 08/24/2016, 12:30 PM  [place] MAIN [facility] SERVICES [address] [place], [place], [address] Phone: [phone]   Fax:  [phone]  Name: Kenneth Franklin MRN: [id number] Date of Birth: Oct 21, 1946

## 2016-08-24 NOTE — Therapy (Signed)
[place] MAIN [facility] SERVICES [address] [place], [place], [address] Phone: [phone]   Fax:  [phone]  Occupational Therapy Treatment  Patient Details  Name: Kenneth Franklin MRN: [id number] Date of Birth: Aug 22, 1946 No Data Recorded  Encounter Date: 08/24/2016      OT End of Session - 08/24/16 1036    Visit Number 8   Number of Visits 12   Date for OT Re-Evaluation 09/06/16   Authorization Type Medicare G code 8   OT Start Time 0846   OT Stop Time 0933   OT Time Calculation (min) 47 min   Activity Tolerance Patient tolerated treatment well   Behavior During Therapy [facility] for tasks assessed/performed      Past Medical History:  Diagnosis Date  . Anxiety   . Depression   . Diabetes mellitus without complication ([place])   . Hyperlipidemia   . Hypertension     Past Surgical History:  Procedure Laterality Date  . CATARACT EXTRACTION Bilateral     There were no vitals filed for this visit.      Subjective Assessment - 08/24/16 1028    Subjective  Patient reports he was having  trouble with his stomach last week and still has trouble,  reports the doctor wants to do a scan.  He feels he is progressing but is worried about going back to work with his primary concerns being climbing a ladder, walking long distance throughout the day, lifting 40-50#.     Pertinent History Patient reports on 06/26/2016 he was getting ready to go to his brothers house and went to get into his truck and was unable to use or pick up his left foot and could not move his arm. He drove 3 miles to his brothers house and His brother transported him to [facility] he was discharged from the ER and then went to [facility] and was hospitalized for 3 to 4 days before being discharged home. He has not received any therapy.  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   Patient Stated Goals "Be back normal and do like I was" Go back to work.   Currently in  Pain? No/denies   Multiple Pain Sites No                      OT Treatments/Exercises (OP) - 08/24/16 1031      Fine Motor Coordination   Other Fine Motor Exercises Patient seen for fine motor coordination exercises with knotting/unknotting with emphasis on prehension patterns and speed, grooved pegboard with use for using the hand for storage.  Reassessment of 9 hole peg test left hand 26 sec.      Neurological Re-education Exercises   Other Exercises 1 Patient seen for UBE strengthening from a seated position with resistance of 3.0 to 3.5 for 4 minutes, therapist in constant attendance to adjust resistance and ensure grip on the left.  2# weight to left wrist for multi level reaching with shape tower, 4 levels and then in the reverse.  Grip strength reassessed, right 70#, left 62# (up from 40#)  Lateral pinch on left 12#, 3 point pinch 15#.                  OT Education - 08/24/16 1036    Education provided Yes   Education Details Progress, goals, HEP   Person(s) Educated Patient   Methods Explanation;Demonstration;Verbal cues   Comprehension Verbal cues required;Returned demonstration;Verbalized  understanding             OT Long Term Goals - 08/24/16 1039      OT LONG TERM GOAL #1   Title Patient will demonstrate improvement in fine motor coordination as evidenced of nine hole peg test on the left by 10 seconds to be able to complete buttons independently.    Baseline 34 sec   Time 6   Period Weeks   Status Partially Met     OT LONG TERM GOAL #2   Title Patient will complete meal preparation with modified independence.    Baseline step daughter bringing in meals   Time 6   Period Weeks   Status On-going     OT LONG TERM GOAL #3   Title Patient will improve left UE strength by 1 mm grade to return to work with modifications as needed   Time 6   Period Weeks   Status Partially Met     OT LONG TERM GOAL #4   Title Patient will be independent  with home exercise program.   Baseline none   Time 6   Period Weeks   Status On-going     OT LONG TERM GOAL #5   Title Patient will be modified independent with all self care tasks   Baseline slow to complete, assist with buttons   Time 6   Status Partially Met               Plan - 08/24/16 1037    Clinical Impression Statement Patient has made significant improvements in strength and coordination but needs to continue to increase these skills to return to work in the coming weeks.  He fatigues quickly with tasks and requires frequent rest breaks.  Goals updated this date and patient continues to benefit from skilled OT to maximize safety and independence in daily tasks.    Rehab Potential Good   Clinical Impairments Affecting Rehab Potential positive:  motivation   OT Frequency 2x / week   OT Duration 6 weeks   OT Treatment/Interventions Self-care/ADL training;Therapeutic exercise;Neuromuscular education;Therapeutic exercises;Patient/family education;Functional Mobility Training;DME and/or AE instruction;Manual Therapy;Therapeutic activities;Balance training   Consulted and Agree with Plan of Care Patient      Patient will benefit from skilled therapeutic intervention in order to improve the following deficits and impairments:  Decreased knowledge of use of DME, Impaired flexibility, Pain, Decreased coordination, Decreased mobility, Decreased activity tolerance, Decreased endurance, Decreased range of motion, Decreased strength, Decreased balance, Difficulty walking, Impaired UE functional use  Visit Diagnosis: Other lack of coordination  Muscle weakness (generalized)    Problem List Patient Active Problem List   Diagnosis Date Noted  . Erroneous encounter - disregard 08/02/2016  . Acute ischemic stroke ([place]) 07/07/2016  . Tick bite of flank 06/16/2016  . Grief counseling 06/16/2016  . Fatigue 06/16/2016  . Absent pulse in lower extremity 09/14/2015  . Anxiety and  depression 04/21/2015  . Diabetes mellitus type 2, controlled, without complications ([place]) 40/76/8088  . Acid reflux 04/21/2015  . Calcium blood increased 04/21/2015  . Dyslipidemia 04/21/2015  . HLD (hyperlipidemia) 04/21/2015  . BP (high blood pressure) 04/21/2015   Raquelle Pietro T Taeden Geller, OTR/L, CLT  Chera Slivka 08/24/2016, 10:41 AM  [place] MAIN [facility] SERVICES [place], [place], [address] Phone: [phone]   Fax:  [phone]  Name: ZACKERIE SARA MRN: [id number] Date of Birth: [birth year]

## 2016-08-25 ENCOUNTER — Other Ambulatory Visit: Payer: Self-pay | Admitting: Family Medicine

## 2016-08-25 DIAGNOSIS — F32A Depression, unspecified: Secondary | ICD-10-CM

## 2016-08-25 DIAGNOSIS — F419 Anxiety disorder, unspecified: Principal | ICD-10-CM

## 2016-08-25 DIAGNOSIS — F329 Major depressive disorder, single episode, unspecified: Secondary | ICD-10-CM

## 2016-08-28 ENCOUNTER — Other Ambulatory Visit: Payer: Self-pay | Admitting: Family Medicine

## 2016-08-28 DIAGNOSIS — F329 Major depressive disorder, single episode, unspecified: Secondary | ICD-10-CM

## 2016-08-28 DIAGNOSIS — F32A Depression, unspecified: Secondary | ICD-10-CM

## 2016-08-28 DIAGNOSIS — F419 Anxiety disorder, unspecified: Principal | ICD-10-CM

## 2016-08-29 ENCOUNTER — Ambulatory Visit: Payer: BLUE CROSS/BLUE SHIELD | Attending: Nurse Practitioner | Admitting: Occupational Therapy

## 2016-08-29 ENCOUNTER — Ambulatory Visit: Payer: BLUE CROSS/BLUE SHIELD

## 2016-08-29 ENCOUNTER — Encounter: Payer: Self-pay | Admitting: Occupational Therapy

## 2016-08-29 VITALS — BP 140/77 | HR 89

## 2016-08-29 DIAGNOSIS — R278 Other lack of coordination: Secondary | ICD-10-CM

## 2016-08-29 DIAGNOSIS — M6281 Muscle weakness (generalized): Secondary | ICD-10-CM | POA: Diagnosis present

## 2016-08-29 DIAGNOSIS — I639 Cerebral infarction, unspecified: Secondary | ICD-10-CM | POA: Diagnosis present

## 2016-08-29 DIAGNOSIS — R262 Difficulty in walking, not elsewhere classified: Secondary | ICD-10-CM | POA: Insufficient documentation

## 2016-08-29 NOTE — Therapy (Signed)
[place] MAIN [facility] SERVICES [address] [place], [place], [address] Phone: [phone]   Fax:  [phone]  Physical Therapy Treatment  Patient Details  Name: Kenneth Franklin MRN: [id number] Date of Birth: 1945-12-13 Referring Provider: Rogue Jury  Encounter Date: 08/29/2016      PT End of Session - 08/29/16 0812    Visit Number 4   Number of Visits 13   Date for PT Re-Evaluation 10/06/16   Authorization Type g codes   Authorization Time Period 4/10   PT Start Time 0802   PT Stop Time 0845   PT Time Calculation (min) 43 min   Equipment Utilized During Treatment Gait belt   Activity Tolerance Patient tolerated treatment well   Behavior During Therapy [facility] for tasks assessed/performed      Past Medical History:  Diagnosis Date  . Anxiety   . Depression   . Diabetes mellitus without complication ([place])   . Hyperlipidemia   . Hypertension     Past Surgical History:  Procedure Laterality Date  . CATARACT EXTRACTION Bilateral     Vitals:   08/29/16 0806  BP: 140/77  Pulse: 89        Subjective Assessment - 08/29/16 0804    Subjective Patient states he's been feeling alright with no major complaints. Patient states he's begining to feel stronger.    Pertinent History On 7/30 LLE weakness and difficutly lifting LLE into car.  He went to ED at [facility] who completed CT which was negative and was instructed to follow up with his primary physician.  He remained concerned about symptoms and reports he went to [facility] where he was admitted for 3 days, it was there that he was told he had a CVA. He presents today with residual LLE weakness and impaired coordination. Uses quad cane in community but does not use AD in home.  Still has to pick up LLE to bring it into his car.  Continues to experience numbness LLE, his L knee buckles ~1x/wk.  Has had 3 falls in the past month, says it occurs when getting out of the shower which he recently  renovated and has not had any falls since.  Has a ramp at home so has not trialed ascending/descending steps.  Is concerned about climbing equipment ladders at work.  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  Job duties includes climbing up ladders, lifting up to 50#.  Currently out on disability until at least the end of September.  Denies any changes in vision since CVA, h/o cataract surgery when he was 70 years old.     How long can you walk comfortably? No issues with community ambulation, has not tested to see how far he can walk   Diagnostic tests +MRI for CVA   Patient Stated Goals to be able to complete job duties and return to work      TREATMENT Therapeutic Exercise: Leg Press at the machine - 3x10 90# B LE; 2 x 10 #60, x10 #45 with LLE only Sit to stands with airex on chair -  x10, 2 x 15 without use of arms (Airex placed over chair to improve ease of movement) Standing hip abduction - 2 x 15   Neuromuscular re-ed Standing airex pad feet together head turns 15 each side (up/down, left/right, EC) Tandem stance on airex pad -  30sec x 2 Cone taps from airex - 2 cones with alternating LE - x10, x 15  Side stepping on airex beam -  x8 down and back Single leg balance each LE, pt reaching out for // bars for support - 2 x 10sec *Patient requires sitting rest breaks throughout session secondary to fatigue       PT Education - 08/29/16 0812    Education provided Yes   Education Details HEP: Educated on form and and technique during exercise   Person(s) Educated Patient   Methods Explanation;Demonstration   Comprehension Verbalized understanding;Returned demonstration             PT Long Term Goals - 08/10/16 1132      PT LONG TERM GOAL #1   Title Pt will improve Berg score by at least 5 points to demonstrate decreased risk for falls   Baseline 38/56   Time 6    Period Weeks   Status New     PT LONG TERM GOAL #2   Title Pt will improve 5xSTS time to 12 sec to demonstrate improved BLE strength   Baseline 18 sec   Time 6   Period Weeks   Status New     PT LONG TERM GOAL #3   Title Pt will improve 10MWT to at least 1.2 m/s to demonstrate improved gait speed   Baseline 0.72 m/s   Time 6   Period Weeks   Status New     PT LONG TERM GOAL #4   Title Pt will improve all areas of LLE strength with deficits on eval by at least 1 grade with MMT   Baseline See evaluation note for detailed report   Time 6   Period Weeks   Status New               Plan - 08/29/16 [id number]    Clinical Impression Statement Patient demonstrates improved LE strength when performing exercises today indicating improved muscular strength and endurance. Patient demonstrates decreased single leg stance with contralateral hip drop indicating decreased glute med strength and pt will benefit from further skilled therapy to return to prior level of function.    Rehab Potential Good   Clinical Impairments Affecting Rehab Potential CVA 8/10 with residual symptoms   PT Frequency 2x / week   PT Duration 6 weeks   PT Treatment/Interventions ADLs/Self Care Home Management;Aquatic Therapy;Cryotherapy;Electrical Stimulation;Moist Heat;DME Instruction;Gait training;Stair training;Functional mobility training;Therapeutic activities;Therapeutic exercise;Balance training;Neuromuscular re-education;Patient/family education;Manual techniques;Passive range of motion;Energy conservation;Taping   PT Next Visit Plan Provide pt with information on Healthcare Directives; Have pt complete ABC scale; Balance, strengthening, and coordination exercises   PT Home Exercise Plan Alternating toe taps, single leg balance   Consulted and Agree with Plan of Care Patient      Patient will benefit from skilled therapeutic intervention in order to improve the following deficits and impairments:  Abnormal  gait, Decreased balance, Decreased activity tolerance, Decreased coordination, Decreased endurance, Decreased knowledge of use of DME, Decreased safety awareness, Decreased strength, Difficulty walking, Impaired perceived functional ability, Impaired flexibility, Impaired sensation, Impaired UE functional use, Improper body mechanics, Postural dysfunction  Visit Diagnosis: Other lack of coordination  Muscle weakness (generalized)     Problem List Patient Active Problem List   Diagnosis Date Noted  . Erroneous encounter - disregard 08/02/2016  . Acute ischemic stroke ([place]) 07/07/2016  . Tick bite of flank 06/16/2016  . Grief counseling 06/16/2016  . Fatigue 06/16/2016  . Absent pulse in lower  extremity 09/14/2015  . Anxiety and depression 04/21/2015  . Diabetes mellitus type 2, controlled, without complications ([place]) 0000000  . Acid reflux 04/21/2015  . Calcium blood increased 04/21/2015  . Dyslipidemia 04/21/2015  . HLD (hyperlipidemia) 04/21/2015  . BP (high blood pressure) 04/21/2015    Blythe Stanford, PT DPT 08/29/2016, 8:47 AM  [place] MAIN [facility] SERVICES [address] [place], [place], [address] Phone: [phone]   Fax:  [phone]  Name: Kenneth Franklin MRN: [id number] Date of Birth: 08-09-1946

## 2016-08-31 ENCOUNTER — Ambulatory Visit: Payer: BLUE CROSS/BLUE SHIELD

## 2016-08-31 ENCOUNTER — Ambulatory Visit: Payer: BLUE CROSS/BLUE SHIELD | Admitting: Occupational Therapy

## 2016-08-31 VITALS — BP 154/75 | HR 81

## 2016-08-31 DIAGNOSIS — I639 Cerebral infarction, unspecified: Secondary | ICD-10-CM

## 2016-08-31 DIAGNOSIS — R278 Other lack of coordination: Secondary | ICD-10-CM

## 2016-08-31 DIAGNOSIS — M6281 Muscle weakness (generalized): Secondary | ICD-10-CM

## 2016-08-31 NOTE — Therapy (Signed)
[place] MAIN [facility] SERVICES [address] [place], [place], [address] Phone: [phone]   Fax:  [phone]  Physical Therapy Treatment  Patient Details  Name: Kenneth Franklin MRN: [id number] Date of Birth: 05-25-1946 Referring Provider: Rogue Jury  Encounter Date: 08/31/2016      PT End of Session - 08/31/16 0808    Visit Number 5   Number of Visits 13   Date for PT Re-Evaluation 2016/10/21   Authorization Type g codes   Authorization Time Period 5/10   PT Start Time 0800   PT Stop Time 0845   PT Time Calculation (min) 45 min   Equipment Utilized During Treatment Gait belt   Activity Tolerance Patient tolerated treatment well   Behavior During Therapy [facility] for tasks assessed/performed      Past Medical History:  Diagnosis Date  . Anxiety   . Depression   . Diabetes mellitus without complication ([place])   . Hyperlipidemia   . Hypertension     Past Surgical History:  Procedure Laterality Date  . CATARACT EXTRACTION Bilateral     Vitals:   08/31/16 0802  BP: (!) 154/75  Pulse: 81        Subjective Assessment - 08/31/16 0802    Subjective Patient states he's feeling overall stronger and feels more confident ambulating without an AD. Patient also reports improvement in balance when standing.  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    Pertinent History On 7/30 LLE weakness and difficutly lifting LLE into car.  He went to ED at [facility] who completed CT which was negative and was instructed to follow up with his primary physician.  He remained concerned about symptoms and reports he went to [facility] where he was admitted for 3 days, it was there that he was told he had a CVA. He presents today with residual LLE weakness and impaired coordination. Uses quad cane in community but does not use AD in home.  Still has to pick up LLE to bring it into  his car.  Continues to experience numbness LLE, his L knee buckles ~1x/wk.  Has had 3 falls in the past month, says it occurs when getting out of the shower which he recently renovated and has not had any falls since.  Has a ramp at home so has not trialed ascending/descending steps.  Is concerned about climbing equipment ladders at work.  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  Job duties includes climbing up ladders, lifting up to 50#.  Currently out on disability until at least the end of September.  Denies any changes in vision since CVA, h/o cataract surgery when he was 70 years old.     How long can you walk comfortably? No issues with community ambulation, has not tested to see how far he can walk   Diagnostic tests +MRI for CVA   Patient Stated Goals to be able to complete job duties and return to work      TREATMENT Therapeutic Exercise: Leg Press at the machine - x10 90#, 2 x 10 105# B LE; 2 x 10 #60, x10 #45 with LLE only Box lifts onto 2 ft table  - 20# 3 x10 with cueing on form and technique while performing  Sit to stands with airex on chair -  2 x 15 without use of arms (Airex placed over chair to improve ease of movement) Standing hip abduction - 2 x 15   Neuromuscular re-ed Ambulating tandem stance on airex beam - x 10 back and forth Side stepping on airex beam -  x8 down and back Tandem walking on airex beam - x 6 (forward and backwards) Cone taps from airex - 2 cones with alternating LE - x10, x 15   *Patient requires sitting rest breaks throughout session secondary to fatigue       PT Education - 08/31/16 0806    Education provided Yes   Education Details Educated on proper form and technique when performing LE strengthening exercise   Person(s) Educated Patient   Methods Explanation;Demonstration   Comprehension Verbalized understanding;Returned  demonstration             PT Long Term Goals - 08/10/16 1132      PT LONG TERM GOAL #1   Title Pt will improve Berg score by at least 5 points to demonstrate decreased risk for falls   Baseline 38/56   Time 6   Period Weeks   Status New     PT LONG TERM GOAL #2   Title Pt will improve 5xSTS time to 12 sec to demonstrate improved BLE strength   Baseline 18 sec   Time 6   Period Weeks   Status New     PT LONG TERM GOAL #3   Title Pt will improve 10MWT to at least 1.2 m/s to demonstrate improved gait speed   Baseline 0.72 m/s   Time 6   Period Weeks   Status New     PT LONG TERM GOAL #4   Title Pt will improve all areas of LLE strength with deficits on eval by at least 1 grade with MMT   Baseline See evaluation note for detailed report   Time 6   Period Weeks   Status New               Plan - 08/31/16 [id number]    Clinical Impression Statement Focused on performing muscular endurance based exercises today and preparing patient to perform work related activities as patient wishes to return to work. Patient demonstrates increased fatigue post performing  exercises indicating decreased cardiovascular and muscular endurance and patient will benefit from further skilled therapy focused on improving endurance to return to prior level of function.    Rehab Potential Good   Clinical Impairments Affecting Rehab Potential CVA 8/10 with residual symptoms   PT Frequency 2x / week   PT Duration 6 weeks   PT Treatment/Interventions ADLs/Self Care Home Management;Aquatic Therapy;Cryotherapy;Electrical Stimulation;Moist Heat;DME Instruction;Gait training;Stair training;Functional mobility training;Therapeutic activities;Therapeutic exercise;Balance training;Neuromuscular re-education;Patient/family education;Manual techniques;Passive range of motion;Energy conservation;Taping   PT Next Visit Plan Provide pt with information on Healthcare Directives; Have pt complete ABC scale; Balance,  strengthening, and coordination exercises   PT Home Exercise Plan Alternating toe taps, single leg balance   Consulted and Agree with Plan of Care Patient      Patient will benefit from skilled therapeutic intervention in order to improve the following deficits and impairments:  Abnormal gait, Decreased balance, Decreased activity tolerance, Decreased coordination, Decreased endurance, Decreased knowledge of use of DME, Decreased safety awareness, Decreased strength, Difficulty walking, Impaired perceived functional ability, Impaired flexibility, Impaired sensation, Impaired UE functional use, Improper body mechanics, Postural dysfunction  Visit Diagnosis: Other lack of coordination  Muscle  weakness (generalized)     Problem List Patient Active Problem List   Diagnosis Date Noted  . Erroneous encounter - disregard 08/02/2016  . Acute ischemic stroke ([place]) 07/07/2016  . Tick bite of flank 06/16/2016  . Grief counseling 06/16/2016  . Fatigue 06/16/2016  . Absent pulse in lower extremity 09/14/2015  . Anxiety and depression 04/21/2015  . Diabetes mellitus type 2, controlled, without complications ([place]) 0000000  . Acid reflux 04/21/2015  . Calcium blood increased 04/21/2015  . Dyslipidemia 04/21/2015  . HLD (hyperlipidemia) 04/21/2015  . BP (high blood pressure) 04/21/2015    Blythe Stanford, PT DPT 08/31/2016, 9:02 AM  [place] MAIN [facility] SERVICES [place], [place], [address] Phone: [phone]   Fax:  [phone]  Name: DAAIEL BEARDMORE MRN: [id number] Date of Birth: June 14, 1946

## 2016-09-02 NOTE — Therapy (Signed)
[place] MAIN [facility] SERVICES [address] [place], [place], [address] Phone: [phone]   Fax:  [phone]  Occupational Therapy Treatment  Patient Details  Name: Kenneth Franklin MRN: [id number] Date of Birth: 1946/07/02 No Data Recorded  Encounter Date: 08/29/2016      OT End of Session - 09/02/16 0930    Visit Number 9   Number of Visits 12   Date for OT Re-Evaluation 09/06/16   Authorization Type Medicare G code 9   OT Start Time 0845   OT Stop Time 0930   OT Time Calculation (min) 45 min   Activity Tolerance Patient tolerated treatment well   Behavior During Therapy [facility] for tasks assessed/performed      Past Medical History:  Diagnosis Date  . Anxiety   . Depression   . Diabetes mellitus without complication ([place])   . Hyperlipidemia   . Hypertension     Past Surgical History:  Procedure Laterality Date  . CATARACT EXTRACTION Bilateral     There were no vitals filed for this visit.      Subjective Assessment - 09/02/16 0930    Subjective  Patient reports he is supposed to go to the doctor on Thursday, needs to see about working, his disability ran out last week and he needs an update from the doctor.     Pertinent History Patient reports on 06/26/2016 he was getting ready to go to his brothers house and went to get into his truck and was unable to use or pick up his left foot and could not move his arm. He drove 3 miles to his brothers house and His brother transported him to [facility] he was discharged from the ER and then went to [facility] and was hospitalized for 3 to 4 days before being discharged home. He has not received any therapy.  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   Patient Stated Goals "Be back normal and do like I was" Go back to work.   Currently in Pain? No/denies   Multiple Pain Sites No                      OT Treatments/Exercises (OP) - 09/02/16 6256      Fine Motor  Coordination   Other Fine Motor Exercises Patient seen for focus on coordination activities with left hand using [place] discs for turning and flipping for manipulation of objects with emphasis on speed, coordination and dexterity, occasional cues required for patterns and technique.       Neurological Re-education Exercises   Other Exercises 1 Patient seen for LUE strengthening with resistive reciprocal arm movements with setting of 3.3 to 3.5 for 6 minutes total, forwards and backwards, alternating levels of resistance and therapist in attendance to ensure grip and adjust settings.  5# dowel exercises for shoulder flexion, ABD, ADD, elbow flexion/extension, forwards and backwards circles, chest press for 2 sets of 12 reps each, cues for form and technique and to discourage compensatory patterns of movement.                 OT Education - 09/02/16 0930    Education provided Yes             OT Long Term Goals - 08/24/16 1039      OT LONG TERM GOAL #1   Title Patient will demonstrate improvement in fine motor coordination as evidenced of nine hole peg test on the  left by 10 seconds to be able to complete buttons independently.    Baseline 34 sec   Time 6   Period Weeks   Status Partially Met     OT LONG TERM GOAL #2   Title Patient will complete meal preparation with modified independence.    Baseline step daughter bringing in meals   Time 6   Period Weeks   Status On-going     OT LONG TERM GOAL #3   Title Patient will improve left UE strength by 1 mm grade to return to work with modifications as needed   Time 6   Period Weeks   Status Partially Met     OT LONG TERM GOAL #4   Title Patient will be independent with home exercise program.   Baseline none   Time 6   Period Weeks   Status On-going     OT LONG TERM GOAL #5   Title Patient will be modified independent with all self care tasks   Baseline slow to complete, assist with buttons   Time 6   Status  Partially Met               Plan - 09/02/16 0931    Clinical Impression Statement Patient continues to progress with strength and endurance in his UE to complete daily tasks.  He requires occasional rest breaks and is working towards being able to return to work in the future.  Fine motor coordination has improved with increased speed, dexterity and ability to pick up smaller objects, occasional dropping.  Advanced to 5# dowel exercises this date.    Rehab Potential Good   Clinical Impairments Affecting Rehab Potential positive:  motivation   OT Frequency 2x / week   OT Duration 6 weeks   OT Treatment/Interventions Self-care/ADL training;Therapeutic exercise;Neuromuscular education;Therapeutic exercises;Patient/family education;Functional Mobility Training;DME and/or AE instruction;Manual Therapy;Therapeutic activities;Balance training   Consulted and Agree with Plan of Care Patient      Patient will benefit from skilled therapeutic intervention in order to improve the following deficits and impairments:  Decreased knowledge of use of DME, Impaired flexibility, Pain, Decreased coordination, Decreased mobility, Decreased activity tolerance, Decreased endurance, Decreased range of motion, Decreased strength, Decreased balance, Difficulty walking, Impaired UE functional use  Visit Diagnosis: Other lack of coordination  Muscle weakness (generalized)    Problem List Patient Active Problem List   Diagnosis Date Noted  . Erroneous encounter - disregard 08/02/2016  . Acute ischemic stroke ([place]) 07/07/2016  . Tick bite of flank 06/16/2016  . Grief counseling 06/16/2016  . Fatigue 06/16/2016  . Absent pulse in lower extremity 09/14/2015  . Anxiety and depression 04/21/2015  . Diabetes mellitus type 2, controlled, without complications ([place]) 36/10/2448  . Acid reflux 04/21/2015  . Calcium blood increased 04/21/2015  . Dyslipidemia 04/21/2015  . HLD (hyperlipidemia) 04/21/2015  . BP  (high blood pressure) 04/21/2015   Kelvis Berger T Tomasita Morrow, OTR/L, CLT  Nikolina Simerson 09/02/2016, 9:37 AM  [place] MAIN [facility] SERVICES [address] [place], [place], [address] Phone: [phone]   Fax:  [phone]  Name: Kenneth Franklin MRN: [id number] Date of Birth: 1946/07/14

## 2016-09-03 ENCOUNTER — Encounter: Payer: Self-pay | Admitting: Occupational Therapy

## 2016-09-03 NOTE — Therapy (Signed)
[place] MAIN [facility] SERVICES [address] [place], [place], [address] Phone: [phone]   Fax:  [phone]  Occupational Therapy Treatment/Progress Note  Patient Details  Name: Kenneth Franklin MRN: [id number] Date of Birth: 1946/02/21 No Data Recorded  Encounter Date: 08/31/2016      OT End of Session - 09/03/16 1943    Visit Number 10   Number of Visits 12   Date for OT Re-Evaluation 09/06/16   OT Start Time 0945   OT Stop Time 0931   OT Time Calculation (min) 1426 min   Activity Tolerance Patient tolerated treatment well   Behavior During Therapy [facility] for tasks assessed/performed      Past Medical History:  Diagnosis Date  . Anxiety   . Depression   . Diabetes mellitus without complication ([place])   . Hyperlipidemia   . Hypertension     Past Surgical History:  Procedure Laterality Date  . CATARACT EXTRACTION Bilateral     There were no vitals filed for this visit.      Subjective Assessment - 09/03/16 1937    Subjective  Patient reports his disability was extended, still working on balance and trying to get rid of his cane.    Pertinent History Patient reports on 06/26/2016 he was getting ready to go to his brothers house and went to get into his truck and was unable to use or pick up his left foot and could not move his arm. He drove 3 miles to his brothers house and His brother transported him to [facility] he was discharged from the ER and then went to [facility] and was hospitalized for 3 to 4 days before being discharged home. He has not received any therapy.  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   Patient Stated Goals "Be back normal and do like I was" Go back to work.   Currently in Pain? No/denies   Multiple Pain Sites No                      OT Treatments/Exercises (OP) - 09/03/16 1938      Fine Motor Coordination   Other Fine Motor Exercises Patient seen for focus on coordination  activities with left hand for knotting and unknotting of medium nylon rope with cues for left hand to lead task.  Increased resistance to knots. Manipulation of small dowels, washers placed in resistive magnetic bowl to add increased challenge for fingers.  Cues for prehension patterns.       Neurological Re-education Exercises   Other Exercises 1 Patient seen for UE strengthening with resistance of 3.5 to 3.7 for 6 minutes, fowards and backwards, alternating resistance.  Reaching task with SAEBO tower 4 levels for 4 sets followed by top to bottom placement for 2 sets, cues to decrease compensatory patterns of movement.                  OT Education - 09/03/16 1943    Education provided Yes   Education Details HEP, [place], strengthening tasks.   Person(s) Educated Patient   Methods Explanation;Demonstration;Verbal cues   Comprehension Verbal cues required;Returned demonstration;Verbalized understanding             OT Long Term Goals - 09/03/16 1947      OT LONG TERM GOAL #1   Title Patient will demonstrate improvement in fine motor coordination as evidenced of nine hole peg test on the left by 10 seconds  to be able to complete buttons independently.    Baseline 34 sec at eval, 26 secs at 10th visit   Time 6   Period Weeks     OT LONG TERM GOAL #2   Title Patient will complete meal preparation with modified independence.    Baseline step daughter bringing in meals   Time 6   Period Weeks   Status On-going     OT LONG TERM GOAL #3   Title Patient will improve left UE strength by 1 mm grade to return to work with modifications as needed   Time 6   Period Weeks   Status Partially Met     OT LONG TERM GOAL #4   Title Patient will be independent with home exercise program.   Baseline none   Time 6   Period Weeks   Status On-going     OT LONG TERM GOAL #5   Title Patient will be modified independent with all self care tasks   Baseline slow to complete, assist with  buttons   Time 6   Status Partially Met               Plan - 09/03/16 1944    Clinical Impression Statement Patient has continued to progress in all areas of care, left UE with increased speed and dexterity, increased use in functional tasks at home and is dropping items less frequently.  He continues to benefit from skilled OT to increase his independence in daily tasks at home as well as potential return to work tasks.     Rehab Potential Good   Clinical Impairments Affecting Rehab Potential positive:  motivation   OT Frequency 2x / week   OT Duration 6 weeks   OT Treatment/Interventions Self-care/ADL training;Therapeutic exercise;Neuromuscular education;Therapeutic exercises;Patient/family education;Functional Mobility Training;DME and/or AE instruction;Manual Therapy;Therapeutic activities;Balance training   Consulted and Agree with Plan of Care Patient      Patient will benefit from skilled therapeutic intervention in order to improve the following deficits and impairments:  Decreased knowledge of use of DME, Impaired flexibility, Pain, Decreased coordination, Decreased mobility, Decreased activity tolerance, Decreased endurance, Decreased range of motion, Decreased strength, Decreased balance, Difficulty walking, Impaired UE functional use  Visit Diagnosis: Acute ischemic stroke [facility])    Problem List Patient Active Problem List   Diagnosis Date Noted  . Erroneous encounter - disregard 08/02/2016  . Acute ischemic stroke ([place]) 07/07/2016  . Tick bite of flank 06/16/2016  . Grief counseling 06/16/2016  . Fatigue 06/16/2016  . Absent pulse in lower extremity 09/14/2015  . Anxiety and depression 04/21/2015  . Diabetes mellitus type 2, controlled, without complications ([place]) 81/19/1478  . Acid reflux 04/21/2015  . Calcium blood increased 04/21/2015  . Dyslipidemia 04/21/2015  . HLD (hyperlipidemia) 04/21/2015  . BP (high blood pressure) 04/21/2015   Amy T Lovett,  OTR/L, CLT  Lovett,Amy 09/03/2016, 7:52 PM  [place] MAIN [facility] SERVICES [address] [place], [place], [address] Phone: [phone]   Fax:  [phone]  Name: Kenneth Franklin MRN: [id number] Date of Birth: 25-Oct-1946

## 2016-09-06 ENCOUNTER — Ambulatory Visit: Payer: BLUE CROSS/BLUE SHIELD | Admitting: Occupational Therapy

## 2016-09-06 ENCOUNTER — Ambulatory Visit: Payer: BLUE CROSS/BLUE SHIELD

## 2016-09-06 ENCOUNTER — Encounter: Payer: Self-pay | Admitting: Occupational Therapy

## 2016-09-06 VITALS — BP 129/83

## 2016-09-06 DIAGNOSIS — R278 Other lack of coordination: Secondary | ICD-10-CM | POA: Diagnosis not present

## 2016-09-06 DIAGNOSIS — M6281 Muscle weakness (generalized): Secondary | ICD-10-CM

## 2016-09-06 NOTE — Therapy (Signed)
[place] MAIN [facility] SERVICES [address] [place], [place], [address] Phone: [phone]   Fax:  [phone]  Physical Therapy Treatment  Patient Details  Name: Kenneth Franklin MRN: [id number] Date of Birth: 08-12-1946 Referring Provider: Rogue Jury  Encounter Date: 09/06/2016      PT End of Session - 09/06/16 0902    Visit Number 6   Number of Visits 13   Date for PT Re-Evaluation September 28, 2016   Authorization Type g codes   Authorization Time Period 6/10   PT Start Time 0846   PT Stop Time 0930   PT Time Calculation (min) 44 min   Equipment Utilized During Treatment Gait belt   Activity Tolerance Patient tolerated treatment well   Behavior During Therapy [facility] for tasks assessed/performed      Past Medical History:  Diagnosis Date  . Anxiety   . Depression   . Diabetes mellitus without complication ([place])   . Hyperlipidemia   . Hypertension     Past Surgical History:  Procedure Laterality Date  . CATARACT EXTRACTION Bilateral     Vitals:   09/06/16 0850  BP: 129/83        Subjective Assessment - 09/06/16 0848    Subjective Patient states no major changes and feels he's getting stronger overall. Patient states he's going to his physician after this visit.    Pertinent History On 7/30 LLE weakness and difficutly lifting LLE into car.  He went to ED at [facility] who completed CT which was negative and was instructed to follow up with his primary physician.  He remained concerned about symptoms and reports he went to [facility] where he was admitted for 3 days, it was there that he was told he had a CVA. He presents today with residual LLE weakness and impaired coordination. Uses quad cane in community but does not use AD in home.  Still has to pick up LLE to bring it into his car.  Continues to experience numbness LLE, his L knee buckles ~1x/wk.  Has had 3 falls in the past month, says it occurs when getting out of the shower which he  recently renovated and has not had any falls since.  Has a ramp at home so has not trialed ascending/descending steps.  Is concerned about climbing equipment ladders at work.  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  Job duties includes climbing up ladders, lifting up to 50#.  Currently out on disability until at least the end of September.  Denies any changes in vision since CVA, h/o cataract surgery when he was 70 years old.     How long can you walk comfortably? No issues with community ambulation, has not tested to see how far he can walk   Diagnostic tests +MRI for CVA   Patient Stated Goals to be able to complete job duties and return to work      TREATMENT Therapeutic Exercise: Leg Press at the machine - , x 10 105#; x10 120# B LE; 2 x 10 #60, with LLE only Box lifts onto 2 ft table  - 30#  x10 with cueing on form and technique while performing Sit to stands-  2 x 15 without use of arms with RTB around knee to encourage glute med activation Marches on [employer] with cueing on muscular activation and knee positioning -  x 20 bilaterally  Standing hip abduction - 2 x 15   Neuromuscular re-ed Tandem walking on airex beam - x 10 (forward and backwards) Side stepping on airex beam -  x9 down and back with RTB around knees  Feet together balancing on bosu - 30sec x 2 on black and blue sides Cone taps from airex - 2 cones with alternating LE with cones on rocker board - x10    *Patient requires sitting rest breaks throughout session secondary to fatigue       PT Education - 09/06/16 0901    Education provided Yes   Education Details Educated on squat technique and knee positioning when bending   Person(s) Educated Patient   Methods Explanation;Demonstration   Comprehension Verbalized understanding;Returned demonstration             PT Long Term Goals - 08/10/16  1132      PT LONG TERM GOAL #1   Title Pt will improve Berg score by at least 5 points to demonstrate decreased risk for falls   Baseline 38/56   Time 6   Period Weeks   Status New     PT LONG TERM GOAL #2   Title Pt will improve 5xSTS time to 12 sec to demonstrate improved BLE strength   Baseline 18 sec   Time 6   Period Weeks   Status New     PT LONG TERM GOAL #3   Title Pt will improve 10MWT to at least 1.2 m/s to demonstrate improved gait speed   Baseline 0.72 m/s   Time 6   Period Weeks   Status New     PT LONG TERM GOAL #4   Title Pt will improve all areas of LLE strength with deficits on eval by at least 1 grade with MMT   Baseline See evaluation note for detailed report   Time 6   Period Weeks   Status New               Plan - 09/06/16 0911    Clinical Impression Statement Focused on performing job related tasks to assess return to occupational tasks. Patient demonstrates ability to lift 30# box onto a 2 ft shelf with fatigue and difficulty after performing 6 reps indicating decreased muscular strength. Pt able to perform pushing exercise of ~ 180# from a stool safely. Patient continues to demonstrate decreased balance ability and muscular endurance and will benefit from further skilled therapy to return to prior level of function.     Rehab Potential Good   Clinical Impairments Affecting Rehab Potential CVA 8/10 with residual symptoms   PT Frequency 2x / week   PT Duration 6 weeks   PT Treatment/Interventions ADLs/Self Care Home Management;Aquatic Therapy;Cryotherapy;Electrical Stimulation;Moist Heat;DME Instruction;Gait training;Stair training;Functional mobility training;Therapeutic activities;Therapeutic exercise;Balance training;Neuromuscular re-education;Patient/family education;Manual techniques;Passive range of motion;Energy conservation;Taping   PT Next Visit Plan Provide pt with information on Healthcare Directives; Have pt complete ABC scale; Balance,  strengthening, and coordination exercises   PT Home Exercise Plan Alternating toe taps, single leg balance   Consulted and Agree with Plan of Care Patient      Patient will benefit from skilled therapeutic intervention in order to improve the following deficits and impairments:  Abnormal gait, Decreased balance, Decreased activity tolerance, Decreased coordination, Decreased endurance, Decreased knowledge of use of DME, Decreased safety awareness, Decreased strength, Difficulty walking, Impaired perceived functional ability, Impaired flexibility, Impaired sensation, Impaired UE functional use, Improper body mechanics, Postural dysfunction  Visit Diagnosis:  Other lack of coordination  Muscle weakness (generalized)     Problem List Patient Active Problem List   Diagnosis Date Noted  . Erroneous encounter - disregard 08/02/2016  . Acute ischemic stroke ([place]) 07/07/2016  . Tick bite of flank 06/16/2016  . Grief counseling 06/16/2016  . Fatigue 06/16/2016  . Absent pulse in lower extremity 09/14/2015  . Anxiety and depression 04/21/2015  . Diabetes mellitus type 2, controlled, without complications ([place]) 0000000  . Acid reflux 04/21/2015  . Calcium blood increased 04/21/2015  . Dyslipidemia 04/21/2015  . HLD (hyperlipidemia) 04/21/2015  . BP (high blood pressure) 04/21/2015    Blythe Stanford, PT DPT 09/06/2016, 1:15 PM  [place] MAIN [facility] SERVICES [address] [place], [place], [address] Phone: [phone]   Fax:  [phone]  Name: Kenneth Franklin MRN: [id number] Date of Birth: 12-Jun-1946

## 2016-09-09 ENCOUNTER — Ambulatory Visit: Payer: BLUE CROSS/BLUE SHIELD

## 2016-09-09 ENCOUNTER — Ambulatory Visit: Payer: BLUE CROSS/BLUE SHIELD | Admitting: Occupational Therapy

## 2016-09-09 VITALS — BP 130/89

## 2016-09-09 DIAGNOSIS — R278 Other lack of coordination: Secondary | ICD-10-CM | POA: Diagnosis not present

## 2016-09-09 DIAGNOSIS — M6281 Muscle weakness (generalized): Secondary | ICD-10-CM

## 2016-09-09 NOTE — Therapy (Signed)
[place] MAIN [facility] SERVICES [address] [place], [place], [address] Phone: [phone]   Fax:  [phone]  Occupational Therapy Treatment  Patient Details  Name: Kenneth Franklin MRN: [id number] Date of Birth: 1946/01/28 No Data Recorded  Encounter Date: 09/06/2016      OT End of Session - 09/09/16 0750    Visit Number 11   Number of Visits 24   Date for OT Re-Evaluation 10/18/16   Authorization Type Medicare G code 11   OT Start Time 0930   OT Stop Time 1015   OT Time Calculation (min) 45 min   Activity Tolerance Patient tolerated treatment well   Behavior During Therapy [facility] for tasks assessed/performed      Past Medical History:  Diagnosis Date  . Anxiety   . Depression   . Diabetes mellitus without complication ([place])   . Hyperlipidemia   . Hypertension     Past Surgical History:  Procedure Laterality Date  . CATARACT EXTRACTION Bilateral     There were no vitals filed for this visit.      Subjective Assessment - 09/08/16 0749    Subjective  Patient reports he is going to go by the doctor office this am to check on disability and to see about his upcoming appointment.  His disability has run out and he needs to follow up with doctor's office.     Patient Stated Goals "Be back normal and do like I was" Go back to work.   Currently in Pain? No/denies   Multiple Pain Sites No                      OT Treatments/Exercises (OP) - 09/08/16 6144      Fine Motor Coordination   Other Fine Motor Exercises Patient seen for focus on coordination activities with left hand for manipulation of grooved pegs with cues for using translatory movements of the hand as well as using the hand for storage, progressed to small 1/2 inch pegs with focus on speed and dexterity.  Finger ladder in standing with emphasis on alternating finger movements up and down for 2 complete trials.       Neurological Re-education Exercises   Other Exercises 1 Patient seen for UBE strengthening from a seated position with resistance of 3.8 to 4.0 for 8 minutes, therapist in constant attendance to adjust resistance and ensure grip on the left. 4# dumbbell weight for left UE for shoulder flexion, chest press, elbow flexion/extension for 3 sets of 10 reps each with short rest breaks as needed.  Cues for technique and form and to decrease compensatory responses. Supination with 16 oz hammer with cues for control and form.                  OT Education - 09/09/16 0749    Education provided Yes   Education Details coordination and strengthening   Person(s) Educated Patient   Methods Explanation;Demonstration;Verbal cues   Comprehension Verbal cues required;Returned demonstration;Verbalized understanding             OT Long Term Goals - 09/08/16 0805      OT LONG TERM GOAL #1   Title Patient will demonstrate improvement in fine motor coordination as evidenced of nine hole peg test on the left by 10 seconds to be able to complete buttons independently.    Baseline 34 sec at eval, 26 secs at 10th visit   Time 6   Period  Weeks     OT LONG TERM GOAL #2   Title Patient will complete meal preparation with modified independence.    Baseline step daughter bringing in meals   Time 6   Period Weeks   Status On-going     OT LONG TERM GOAL #3   Title Patient will improve left UE strength by 1 mm grade to return to work with modifications as needed   Time 6   Period Weeks   Status Partially Met     OT LONG TERM GOAL #4   Title Patient will be independent with home exercise program.   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  His coordination has continued to improve and he also reports increased strength when performing tasks however he still fatigues quickly and needs to continue to focus on this area to be able to return to work.  He is able to demo basic exercises at home and has been using his resistive putty for hand exercise and pinch.  Continue to work towards goals to maximize his safety and independence in daily tasks and return to work tasks. Patient continues to benefit from skilled OT.     Rehab Potential Good   Clinical Impairments Affecting Rehab Potential positive:  motivation   OT Frequency 2x / week   OT Duration 6 weeks   OT Treatment/Interventions Self-care/ADL training;Therapeutic exercise;Neuromuscular education;Therapeutic exercises;Patient/family education;Functional Mobility Training;DME and/or AE instruction;Manual Therapy;Therapeutic activities;Balance training   Consulted and Agree with Plan of Care Patient      Patient will benefit from skilled therapeutic intervention in order to improve the following deficits and impairments:  Decreased knowledge of use of DME, Impaired flexibility, Pain, Decreased coordination, Decreased mobility, Decreased activity tolerance, Decreased endurance, Decreased range of motion, Decreased strength, Decreased balance, Difficulty walking, Impaired UE functional use  Visit Diagnosis: Muscle weakness (generalized)  Other lack of coordination    Problem List Patient Active Problem List   Diagnosis Date Noted  . Erroneous encounter - disregard 08/02/2016  . Acute ischemic stroke ([place]) 07/07/2016  . Tick bite of flank 06/16/2016  . Grief counseling 06/16/2016  . Fatigue 06/16/2016  . Absent pulse in lower extremity 09/14/2015  . Anxiety and depression 04/21/2015  . Diabetes mellitus type 2, controlled, without complications ([place]) 16/08/9603  . Acid reflux 04/21/2015  . Calcium blood increased  04/21/2015  . Dyslipidemia 04/21/2015  . HLD (hyperlipidemia) 04/21/2015  . BP (high blood pressure) 04/21/2015   Shameek Nyquist T Zofia Peckinpaugh, OTR/L, CLT  Simcha Farrington 09/09/2016, 8:06 AM  [place] MAIN [facility] SERVICES [place], [place], [address] Phone: [phone]   Fax:  [phone]  Name: Kenneth Franklin MRN: [id number] Date of Birth: 11-07-1946

## 2016-09-09 NOTE — Patient Instructions (Signed)
OT TREATMENT    Neuro muscular re-education:  Pt. performed [facility] tasks using the Grooved pegboard. Pt. worked on grasping the grooved pegs from a horizontal position, and moving the pegs to a vertical position in the hand to prepare for placing them in the grooved slot. Pt. worked on translatory movements of the hand, storing objects, and alternating thumb opposition to the 2nd through 5th digits. Pt. Worked on graspining, and flipping cads alternating thumb on digits, digits on thumb with increasing speed.  Therapeutic Exercise:  Pt. Worked on the [employer] for 8 min. With constant monitoring of the BUEs. Pt. Worked on changing, and alternating forward reverse position after 76min. Rest breaks were required. 4# dumbbell ex. for elbow flexion and extension, forearm supination/pronation, wrist flexion/extension, and radial deviation. Pt. requires rest breaks and verbal cues for proper technique.

## 2016-09-09 NOTE — Therapy (Signed)
[place] MAIN [facility] SERVICES [address] [place], [place], [address] Phone: [phone]   Fax:  [phone]  Physical Therapy Treatment  Patient Details  Name: Kenneth Franklin MRN: [id number] Date of Birth: February 08, 1946 Referring Provider: Rogue Jury  Encounter Date: 09/09/2016      PT End of Session - 09/09/16 0808    Visit Number 7   Number of Visits 13   Date for PT Re-Evaluation 26-Sep-2016   Authorization Type g codes   Authorization Time Period 7/10   PT Start Time 0800   PT Stop Time 0845   PT Time Calculation (min) 45 min   Equipment Utilized During Treatment Gait belt   Activity Tolerance Patient tolerated treatment well   Behavior During Therapy [facility] for tasks assessed/performed      Past Medical History:  Diagnosis Date  . Anxiety   . Depression   . Diabetes mellitus without complication ([place])   . Hyperlipidemia   . Hypertension     Past Surgical History:  Procedure Laterality Date  . CATARACT EXTRACTION Bilateral     Vitals:   09/09/16 0950  BP: 130/89        Subjective Assessment - 09/09/16 0806    Subjective Patient reports no major changes with his condition and feels he continues to feel stronger. State he's returning to his physician after the appointment.   Pertinent History On 7/30 LLE weakness and difficutly lifting LLE into car.  He went to ED at [facility] who completed CT which was negative and was instructed to follow up with his primary physician.  He remained concerned about symptoms and reports he went to [facility] where he was admitted for 3 days, it was there that he was told he had a CVA. He presents today with residual LLE weakness and impaired coordination. Uses quad cane in community but does not use AD in home.  Still has to pick up LLE to bring it into his car.  Continues to experience numbness LLE, his L knee buckles ~1x/wk.  Has had 3 falls in the past month, says it occurs when getting out of the  shower which he recently renovated and has not had any falls since.  Has a ramp at home so has not trialed ascending/descending steps.  Is concerned about climbing equipment ladders at work.  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  Job duties includes climbing up ladders, lifting up to 50#.  Currently out on disability until at least the end of September.  Denies any changes in vision since CVA, h/o cataract surgery when he was 70 years old.     How long can you walk comfortably? No issues with community ambulation, has not tested to see how far he can walk   Diagnostic tests +MRI for CVA   Patient Stated Goals to be able to complete job duties and return to work      TREATMENT Therapeutic Exercise: Sit to stands-  3 x 15 without use of arms with RTB around knee to encourage glute med activation Standing hip abduction - 2 x 15 Side stepping at [employer] ladder - down and back x 4 Backwards amb at [employer] with cueing on foot and knee position - down and back x4 Step ups onto Bosu - x12 (with cueing on foot position) bilaterally second set not performed due  to fatigue   Neuromuscular re-ed Balancing on Bosu Blue side/black side - 1 min each side feet together Single leg stance on airex pad - 2 x 30sec  Pre-gait weight shifts in standing on airex/dynadisc -- x15 bilaterally   *Patient requires sitting rest breaks throughout session secondary to fatigue       PT Education - 09/09/16 0808    Education provided Yes   Education Details Educated on progression of exercise and performance of job roles   Person(s) Educated Patient   Methods Explanation;Demonstration   Comprehension Verbalized understanding;Returned demonstration             PT Long Term Goals - 08/10/16 1132      PT LONG TERM GOAL #1   Title Pt will improve Berg score by at least 5 points to demonstrate  decreased risk for falls   Baseline 38/56   Time 6   Period Weeks   Status New     PT LONG TERM GOAL #2   Title Pt will improve 5xSTS time to 12 sec to demonstrate improved BLE strength   Baseline 18 sec   Time 6   Period Weeks   Status New     PT LONG TERM GOAL #3   Title Pt will improve 10MWT to at least 1.2 m/s to demonstrate improved gait speed   Baseline 0.72 m/s   Time 6   Period Weeks   Status New     PT LONG TERM GOAL #4   Title Pt will improve all areas of LLE strength with deficits on eval by at least 1 grade with MMT   Baseline See evaluation note for detailed report   Time 6   Period Weeks   Status New               Plan - 09/09/16 [id number]    Clinical Impression Statement Focused on performing muscular endurance and strengthening exercise to return to job related tasks. Patient demonstrates improvement in muscular endurance and strength with exercise but continues to experience DOE and requires standing rest breaks with instruction on purse lip breathing. Patient will benefit from further skilled therapy aimed toward improving endurance/strength to return to job related activities.     Rehab Potential Good   Clinical Impairments Affecting Rehab Potential CVA 8/10 with residual symptoms   PT Frequency 2x / week   PT Duration 6 weeks   PT Treatment/Interventions ADLs/Self Care Home Management;Aquatic Therapy;Cryotherapy;Electrical Stimulation;Moist Heat;DME Instruction;Gait training;Stair training;Functional mobility training;Therapeutic activities;Therapeutic exercise;Balance training;Neuromuscular re-education;Patient/family education;Manual techniques;Passive range of motion;Energy conservation;Taping   PT Next Visit Plan Provide pt with information on Healthcare Directives; Have pt complete ABC scale; Balance, strengthening, and coordination exercises   PT Home Exercise Plan Alternating toe taps, single leg balance   Consulted and Agree with Plan of Care  Patient      Patient will benefit from skilled therapeutic intervention in order to improve the following deficits and impairments:  Abnormal gait, Decreased balance, Decreased activity tolerance, Decreased coordination, Decreased endurance, Decreased knowledge of use of DME, Decreased safety awareness, Decreased strength, Difficulty walking, Impaired perceived functional ability, Impaired flexibility, Impaired sensation, Impaired UE functional use, Improper body mechanics, Postural dysfunction  Visit Diagnosis: Other lack of coordination  Muscle weakness (generalized)     Problem List Patient Active Problem List   Diagnosis Date Noted  . Erroneous encounter - disregard 08/02/2016  . Acute ischemic stroke ([place]) 07/07/2016  . Tick bite of flank 06/16/2016  . Grief counseling 06/16/2016  .  Fatigue 06/16/2016  . Absent pulse in lower extremity 09/14/2015  . Anxiety and depression 04/21/2015  . Diabetes mellitus type 2, controlled, without complications ([place]) 0000000  . Acid reflux 04/21/2015  . Calcium blood increased 04/21/2015  . Dyslipidemia 04/21/2015  . HLD (hyperlipidemia) 04/21/2015  . BP (high blood pressure) 04/21/2015    Blythe Stanford, PT DPT 09/09/2016, 9:50 AM  [place] MAIN [facility] SERVICES [place], [place], [address] Phone: [phone]   Fax:  [phone]  Name: Kenneth Franklin MRN: [id number] Date of Birth: January 30, 1946

## 2016-09-09 NOTE — Therapy (Signed)
[place] MAIN [facility] SERVICES [address] [place], [place], [address] Phone: [phone]   Fax:  [phone]  Occupational Therapy Treatment  Patient Details  Name: Kenneth Franklin MRN: [id number] Date of Birth: August 27, 1946 No Data Recorded  Encounter Date: 09/09/2016      OT End of Session - 09/09/16 0907    Visit Number 12   Number of Visits 24   Date for OT Re-Evaluation 10/18/16   Authorization Type Medicare G code 12   OT Start Time 0845   OT Stop Time 0930   OT Time Calculation (min) 45 min   Activity Tolerance Patient tolerated treatment well   Behavior During Therapy [facility] for tasks assessed/performed      Past Medical History:  Diagnosis Date  . Anxiety   . Depression   . Diabetes mellitus without complication ([place])   . Hyperlipidemia   . Hypertension     Past Surgical History:  Procedure Laterality Date  . CATARACT EXTRACTION Bilateral     There were no vitals filed for this visit.      Subjective Assessment - 09/09/16 0859    Subjective  Pt. reports no changes since last session.    Pertinent History Patient reports on 06/26/2016 he was getting ready to go to his brothers house and went to get into his truck and was unable to use or pick up his left foot and could not move his arm. He drove 3 miles to his brothers house and His brother transported him to [facility] he was discharged from the ER and then went to [facility] and was hospitalized for 3 to 4 days before being discharged home. He has not received any therapy.  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   Patient Stated Goals "Be back normal and do like I was" Go back to work.   Currently in Pain? No/denies   Multiple Pain Sites No      OT TREATMENT    Neuro muscular re-education:  Pt. performed [facility] tasks using the Grooved pegboard. Pt. worked on grasping the grooved pegs from a horizontal position, and moving the pegs to a vertical position  in the hand to prepare for placing them in the grooved slot. Pt. worked on translatory movements of the hand, storing objects, and alternating thumb opposition to the 2nd through 5th digits. Pt. Worked on graspining, and flipping cads alternating thumb on digits, digits on thumb with increasing speed.  Therapeutic Exercise:  Pt. Worked on the [employer] for 8 min. With constant monitoring of the BUEs. Pt. Worked on changing, and alternating forward reverse position after 30mn. Rest breaks were required. 4# dumbbell ex. for elbow flexion and extension, forearm supination/pronation, wrist flexion/extension, and radial deviation. Pt. requires rest breaks and verbal cues for proper technique.                            OT Education - 09/09/16 0905    Education provided Yes   Education Details UE ther. ex., and [facility]ills.   Person(s) Educated Patient   Methods Explanation;Demonstration   Comprehension Verbalized understanding;Returned demonstration             OT Long Term Goals - 09/08/16 0805      OT LONG TERM GOAL #1   Title Patient will demonstrate improvement in fine motor coordination as evidenced of nine hole peg test on the left by 10 seconds  to be able to complete buttons independently.    Baseline 34 sec at eval, 26 secs at 10th visit   Time 6   Period Weeks     OT LONG TERM GOAL #2   Title Patient will complete meal preparation with modified independence.    Baseline step daughter bringing in meals   Time 6   Period Weeks   Status On-going     OT LONG TERM GOAL #3   Title Patient will improve left UE strength by 1 mm grade to return to work with modifications as needed   Time 6   Period Weeks   Status Partially Met     OT LONG TERM GOAL #4   Title Patient will be independent with home exercise program.   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 He worked on his [employer]. Pt. continues to fatique with activity, and requires rest breaks, although activity toleance is improving. Pt. conntinues to benefit from skilled OT services to work on improving, LUE strength, activity tolerance, and [place] skills needed for ADLs, IADLs, and work related tasks.   Rehab Potential Good   Clinical Impairments Affecting Rehab Potential positive:  motivation   OT Frequency 2x / week   OT Duration 6 weeks   OT Treatment/Interventions Self-care/ADL training;Therapeutic exercise;Neuromuscular education;Therapeutic exercises;Patient/family education;Functional Mobility Training;DME and/or AE instruction;Manual Therapy;Therapeutic activities;Balance training   Consulted and Agree with Plan of Care Patient      Patient will benefit from skilled therapeutic intervention in order to improve the following deficits and impairments:  Decreased knowledge of use of DME, Impaired flexibility, Pain, Decreased coordination, Decreased mobility, Decreased activity tolerance, Decreased endurance, Decreased range of motion, Decreased strength, Decreased balance, Difficulty walking, Impaired UE functional use  Visit Diagnosis: Muscle weakness (generalized)    Problem List Patient Active Problem List   Diagnosis Date Noted  . Erroneous encounter - disregard 08/02/2016  . Acute ischemic stroke ([place]) 07/07/2016  . Tick bite of flank 06/16/2016  . Grief counseling 06/16/2016  . Fatigue 06/16/2016  . Absent pulse in lower extremity 09/14/2015  . Anxiety and depression 04/21/2015  . Diabetes mellitus type 2, controlled, without complications ([place]) 03/75/4360  . Acid reflux 04/21/2015  . Calcium blood increased 04/21/2015  . Dyslipidemia  04/21/2015  . HLD (hyperlipidemia) 04/21/2015  . BP (high blood pressure) 04/21/2015    Harrel Carina, MS, OTR/L 09/09/2016, 9:37 AM  [place] MAIN [facility] SERVICES [address] [place], [place], [address] Phone: [phone]   Fax:  [phone]  Name: Kenneth Franklin MRN: [id number] Date of Birth: 09-06-1946

## 2016-09-14 ENCOUNTER — Ambulatory Visit: Payer: BLUE CROSS/BLUE SHIELD

## 2016-09-14 ENCOUNTER — Ambulatory Visit: Payer: BLUE CROSS/BLUE SHIELD | Admitting: Occupational Therapy

## 2016-09-14 DIAGNOSIS — M6281 Muscle weakness (generalized): Secondary | ICD-10-CM

## 2016-09-14 DIAGNOSIS — I639 Cerebral infarction, unspecified: Secondary | ICD-10-CM

## 2016-09-14 DIAGNOSIS — R278 Other lack of coordination: Secondary | ICD-10-CM | POA: Diagnosis not present

## 2016-09-14 NOTE — Therapy (Signed)
[place] MAIN [facility] SERVICES [address] [place], [place], [address] Phone: [phone]   Fax:  [phone]  Occupational Therapy Treatment  Patient Details  Name: Kenneth Franklin MRN: [id number] Date of Birth: [birth year] No Data Recorded  Encounter Date: 09/14/2016      OT End of Session - 09/14/16 0915    Visit Number 13   Number of Visits 24   Date for OT Re-Evaluation 10/18/16   Authorization Type Medicare G code 13   OT Start Time 0845   OT Stop Time 0927   OT Time Calculation (min) 42 min   Activity Tolerance Patient tolerated treatment well   Behavior During Therapy [facility] for tasks assessed/performed      Past Medical History:  Diagnosis Date  . Anxiety   . Depression   . Diabetes mellitus without complication ([place])   . Hyperlipidemia   . Hypertension     Past Surgical History:  Procedure Laterality Date  . CATARACT EXTRACTION Bilateral     There were no vitals filed for this visit.      Subjective Assessment - 09/14/16 0858    Subjective  Pt. reports he has an appointment to clear him to return to work.   Pertinent History Patient reports on 06/26/2016 he was getting ready to go to his brothers house and went to get into his truck and was unable to use or pick up his left foot and could not move his arm. He drove 3 miles to his brothers house and His brother transported him to [facility] he was discharged from the ER and then went to [facility] and was hospitalized for 3 to 4 days before being discharged home. He has not received any therapy.  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   Patient Stated Goals "Be back normal and do like I was" Go back to work.      OT TREATMENT    Neuro muscular re-education:  Pt. Worked on grasping coins from a tabletop surface, placing them into a resistive container, and pushing them through the slot while isolating his 2nd digit. A resistive mat was placed under coins  to aide in manipulating the coins and prevent sliding when picking them up. Pt. performed [facility] skills training to improve speed and dexterity needed for ADL tasks and writing. Pt. demonstrated grasping 1 inch sticks,  inch cylindrical collars, and  inch flat washers on the Purdue pegboard. Pt. performed grasping each item with her 2nd digit and thumb, and storing them in the palm. Pt. presented with difficulty storing  inch objects at a time in the palmar aspect of the hand. Pt. performed [facility] tasks using the Grooved pegboard. Pt. worked on grasping the grooved pegs from a horizontal position, and moving the pegs to a vertical position in the hand to prepare for placing them in the grooved slot.   Therapeutic Exercise:  Pt. Worked on the [employer] for 8 min. With constant monitoring of the BUEs. Pt. Worked on changing, and alternating forward reverse position every 2 min. rest breaks were required.   Self-care:   Pt. Worked on opening various medication bottles. Pt. Was able to complete independently.                            OT Education - 09/14/16 0929    Education provided Yes   Education Details UE strength, [place], activity tolerance  Person(s) Educated Patient   Methods Explanation;Demonstration   Comprehension Verbalized understanding;Returned demonstration             OT Long Term Goals - 09/08/16 0805      OT LONG TERM GOAL #1   Title Patient will demonstrate improvement in fine motor coordination as evidenced of nine hole peg test on the left by 10 seconds to be able to complete buttons independently.    Baseline 34 sec at eval, 26 secs at 10th visit   Time 6   Period Weeks     OT LONG TERM GOAL #2   Title Patient will complete meal preparation with modified independence.    Baseline step daughter bringing in meals   Time 6   Period Weeks   Status On-going     OT LONG TERM GOAL #3   Title Patient will improve left UE strength by 1 mm grade to  return to work with modifications as needed   Time 6   Period Weeks   Status Partially Met     OT LONG TERM GOAL #4   Title Patient will be independent with home exercise program.   Baseline none   Time 6   Period Weeks   Status On-going     OT LONG TERM GOAL #5   Title Patient will be modified independent with all self care tasks   Baseline slow to complete, assist with buttons   Time 6   Status Partially Met               Plan - 09/14/16 0901    Clinical Impression Statement Pt. activity tolerance is improving, however pt. continues to require rest breaks with activity. Pt. [place], and overall UE strength is improving. Pt. continues to benefit from skilled OT serivices to work on improving activity tolerance, strength, and [place] for  ADLs, IADLs, and tasks required for returning to work.   Rehab Potential Good   Clinical Impairments Affecting Rehab Potential positive:  motivation   OT Frequency 2x / week   OT Duration 6 weeks   OT Treatment/Interventions Self-care/ADL training;Therapeutic exercise;Neuromuscular education;Therapeutic exercises;Patient/family education;Functional Mobility Training;DME and/or AE instruction;Manual Therapy;Therapeutic activities;Balance training   Consulted and Agree with Plan of Care Patient      Patient will benefit from skilled therapeutic intervention in order to improve the following deficits and impairments:  Decreased knowledge of use of DME, Impaired flexibility, Pain, Decreased coordination, Decreased mobility, Decreased activity tolerance, Decreased endurance, Decreased range of motion, Decreased strength, Decreased balance, Difficulty walking, Impaired UE functional use  Visit Diagnosis: Muscle weakness (generalized)    Problem List Patient Active Problem List   Diagnosis Date Noted  . Erroneous encounter - disregard 08/02/2016  . Acute ischemic stroke ([place]) 07/07/2016  . Tick bite of flank 06/16/2016  . Grief counseling  06/16/2016  . Fatigue 06/16/2016  . Absent pulse in lower extremity 09/14/2015  . Anxiety and depression 04/21/2015  . Diabetes mellitus type 2, controlled, without complications ([place]) 89/38/1017  . Acid reflux 04/21/2015  . Calcium blood increased 04/21/2015  . Dyslipidemia 04/21/2015  . HLD (hyperlipidemia) 04/21/2015  . BP (high blood pressure) 04/21/2015    Harrel Carina, MS, OTR/L 09/14/2016, 9:35 AM  [place] MAIN [facility] SERVICES [place], [place], [address] Phone: [phone]   Fax:  [phone]  Name: Kenneth Franklin MRN: [id number] Date of Birth: 14-Jan-1946

## 2016-09-14 NOTE — Therapy (Signed)
[place] MAIN [facility] SERVICES [address] [place], [place], [address] Phone: [phone]   Fax:  [phone]  Physical Therapy Treatment  Patient Details  Name: Kenneth Franklin MRN: [id number] Date of Birth: 09/24/1946 Referring Provider: Rogue Jury  Encounter Date: 09/14/2016      PT End of Session - 09/14/16 0945    Visit Number 8   Number of Visits 13   Date for PT Re-Evaluation 10-Oct-2016   Authorization Type g codes   Authorization Time Period 8/10   PT Start Time 0932   PT Stop Time 1015   PT Time Calculation (min) 43 min   Equipment Utilized During Treatment Gait belt   Activity Tolerance Patient tolerated treatment well   Behavior During Therapy [facility] for tasks assessed/performed      Past Medical History:  Diagnosis Date  . Anxiety   . Depression   . Diabetes mellitus without complication ([place])   . Hyperlipidemia   . Hypertension     Past Surgical History:  Procedure Laterality Date  . CATARACT EXTRACTION Bilateral     There were no vitals filed for this visit.      Subjective Assessment - 09/14/16 0938    Subjective Patient reports he's prepared to return to work but wants to finish out his scheduled appointments for physical therapy before returning to work. Reports he has no difficulty with ambulating or standing. Reports some fatigue after standing and ambulating for long periods of time.    Pertinent History On 7/30 LLE weakness and difficutly lifting LLE into car.  He went to ED at [facility] who completed CT which was negative and was instructed to follow up with his primary physician.  He remained concerned about symptoms and reports he went to [facility] where he was admitted for 3 days, it was there that he was told he had a CVA. He presents today with residual LLE weakness and impaired coordination. Uses quad cane in community but does not use AD in home.  Still has to pick up LLE to bring it into his car.  Continues to  experience numbness LLE, his L knee buckles ~1x/wk.  Has had 3 falls in the past month, says it occurs when getting out of the shower which he recently renovated and has not had any falls since.  Has a ramp at home so has not trialed ascending/descending steps.  Is concerned about climbing equipment ladders at work.  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  Job duties includes climbing up ladders, lifting up to 50#.  Currently out on disability until at least the end of September.  Denies any changes in vision since CVA, h/o cataract surgery when he was 70 years old.     How long can you walk comfortably? No issues with community ambulation, has not tested to see how far he can walk   Diagnostic tests +MRI for CVA   Patient Stated Goals to be able to complete job duties and return to work      TREATMENT Therapeutic Exercise: Leg Press - BLE 3 x15 135#, LLE 3 x15 75# Sit to stands-  x 15, x 16 without use of arms with RTB around knee to encourage glute med activation Rotational Box lifts -- 30# 2 x 5 Standing hip abduction - 2 x 15 Side stepping  onto 6"  step with airex pad - 2 x 10 Forward stepping onto 6" with airex pad - 2 x 10 Pushing 175# on a stool to simulate pushing carts of oil at work - 71ft   Neuromuscular re-ed Single leg stance on airex pad - 3 x 30sec   SpO2: >96%, HR <127 bpm throughout treatment session   *Patient requires sitting rest breaks throughout session secondary to fatigue         PT Education - 09/14/16 0942    Education provided Yes   Education Details Educated on fatigue level with exercise and taking proper rest breaks   Person(s) Educated Patient   Methods Explanation   Comprehension Verbalized understanding             PT Long Term Goals - 08/10/16 1132      PT LONG TERM GOAL #1   Title Pt will improve Berg score by at least 5  points to demonstrate decreased risk for falls   Baseline 38/56   Time 6   Period Weeks   Status New     PT LONG TERM GOAL #2   Title Pt will improve 5xSTS time to 12 sec to demonstrate improved BLE strength   Baseline 18 sec   Time 6   Period Weeks   Status New     PT LONG TERM GOAL #3   Title Pt will improve 10MWT to at least 1.2 m/s to demonstrate improved gait speed   Baseline 0.72 m/s   Time 6   Period Weeks   Status New     PT LONG TERM GOAL #4   Title Pt will improve all areas of LLE strength with deficits on eval by at least 1 grade with MMT   Baseline See evaluation note for detailed report   Time 6   Period Weeks   Status New               Plan - 09/14/16 1000    Clinical Impression Statement Focused on performing endurance based exercises today to increase functional capacity to perform continuous occupational tasks throughout the day. Patient experiences decreased DOE compared to previous visits indicating functional carryover between visits. Patient demonstrates ability to lift 30# box weights to simulate lifting barrels oil at work with minimal difficulty. Patient also able to push 175# to simulate pushing carts of oil at work. Recommend that patient finishes therapy to work on improving muscular strength and endurance and advance HEP.      Rehab Potential Good   Clinical Impairments Affecting Rehab Potential CVA 8/10 with residual symptoms   PT Frequency 2x / week   PT Duration 6 weeks   PT Treatment/Interventions ADLs/Self Care Home Management;Aquatic Therapy;Cryotherapy;Electrical Stimulation;Moist Heat;DME Instruction;Gait training;Stair training;Functional mobility training;Therapeutic activities;Therapeutic exercise;Balance training;Neuromuscular re-education;Patient/family education;Manual techniques;Passive range of motion;Energy conservation;Taping   PT Next Visit Plan Provide pt with information on Healthcare Directives; Have pt complete ABC scale;  Balance, strengthening, and coordination exercises   PT Home Exercise Plan Alternating toe taps, single leg balance   Consulted and Agree with Plan of Care Patient      Patient will benefit from skilled therapeutic intervention in order to improve the following deficits and impairments:  Abnormal gait, Decreased balance, Decreased activity tolerance, Decreased coordination, Decreased endurance, Decreased knowledge of use of DME, Decreased safety awareness, Decreased strength, Difficulty walking, Impaired perceived functional ability, Impaired flexibility, Impaired sensation, Impaired UE functional use, Improper body mechanics, Postural dysfunction  Visit Diagnosis: Muscle weakness (generalized)  Other  lack of coordination  Acute ischemic stroke [facility])     Problem List Patient Active Problem List   Diagnosis Date Noted  . Erroneous encounter - disregard 08/02/2016  . Acute ischemic stroke ([place]) 07/07/2016  . Tick bite of flank 06/16/2016  . Grief counseling 06/16/2016  . Fatigue 06/16/2016  . Absent pulse in lower extremity 09/14/2015  . Anxiety and depression 04/21/2015  . Diabetes mellitus type 2, controlled, without complications ([place]) 0000000  . Acid reflux 04/21/2015  . Calcium blood increased 04/21/2015  . Dyslipidemia 04/21/2015  . HLD (hyperlipidemia) 04/21/2015  . BP (high blood pressure) 04/21/2015    Blythe Stanford, PT DPT 09/14/2016, 12:54 PM  [place] MAIN [facility] SERVICES [address] [place], [place], [address] Phone: [phone]   Fax:  [phone]  Name: Kenneth Franklin MRN: [id number] Date of Birth: May 11, 1946

## 2016-09-14 NOTE — Patient Instructions (Signed)
OT TREATMENT    Neuro muscular re-education:  Pt. Worked on grasping coins from a tabletop surface, placing them into a resistive container, and pushing them through the slot while isolating his 2nd digit. A resistive mat was placed under coins to aide in manipulating the coins and prevent sliding when picking them up. Pt. performed [facility] skills training to improve speed and dexterity needed for ADL tasks and writing. Pt. demonstrated grasping 1 inch sticks,  inch cylindrical collars, and  inch flat washers on the [employer] pegboard. Pt. performed grasping each item with her 2nd digit and thumb, and storing them in the palm. Pt. presented with difficulty storing  inch objects at a time in the palmar aspect of the hand. Pt. performed [facility] tasks using the Grooved pegboard. Pt. worked on grasping the grooved pegs from a horizontal position, and moving the pegs to a vertical position in the hand to prepare for placing them in the grooved slot.   Therapeutic Exercise:  Pt. Worked on the [employer] for 8 min. With constant monitoring of the BUEs. Pt. Worked on changing, and alternating forward reverse position every 2 min. rest breaks were required.   Self-care:   Pt. Worked on opening various medication bottles. Pt. Was able to complete independently.

## 2016-09-15 ENCOUNTER — Ambulatory Visit (INDEPENDENT_AMBULATORY_CARE_PROVIDER_SITE_OTHER): Payer: BLUE CROSS/BLUE SHIELD | Admitting: Family Medicine

## 2016-09-15 ENCOUNTER — Encounter: Payer: Self-pay | Admitting: Family Medicine

## 2016-09-15 VITALS — BP 142/76 | HR 102 | Temp 98.0°F | Resp 16 | Ht 73.0 in | Wt 154.2 lb

## 2016-09-15 DIAGNOSIS — I10 Essential (primary) hypertension: Secondary | ICD-10-CM | POA: Diagnosis not present

## 2016-09-15 DIAGNOSIS — I639 Cerebral infarction, unspecified: Secondary | ICD-10-CM

## 2016-09-15 DIAGNOSIS — M6281 Muscle weakness (generalized): Secondary | ICD-10-CM | POA: Diagnosis not present

## 2016-09-15 NOTE — Progress Notes (Signed)
Name: Kenneth Franklin   MRN: [id number]    DOB: 01-Jul-1946   Date:09/15/2016       Progress Note  Subjective  Chief Complaint  Chief Complaint  Patient presents with  . Follow-up    Need note to return to work    HPI  Pt. Presents for re-evaluation and to obtain a note authorizing his return back to work. He is undergoing Physical therapy after suffering a stroke in July 2017, he is making good progress based on Physical Therapy notes, strength has improved in his arms and legs, able to walk fine. He wishes to wait until he finishes his last physical therapy appointment on November 2nd, 2017.   Past Medical History:  Diagnosis Date  . Anxiety   . Depression   . Diabetes mellitus without complication ([place])   . Hyperlipidemia   . Hypertension     Past Surgical History:  Procedure Laterality Date  . CATARACT EXTRACTION Bilateral     Family History  Problem Relation Age of Onset  . Diabetes Brother     Social History   Social History  . Marital status: Widowed    Spouse name: N/A  . Number of children: N/A  . Years of education: N/A   Occupational History  . Not on file.   Social History Main Topics  . Smoking status: Current Some Day Smoker    Types: Cigars  . Smokeless tobacco: Never Used  . Alcohol use No  . Drug use: No  . Sexual activity: No   Other Topics Concern  . Not on file   Social History Narrative  . No narrative on file     Current Outpatient Prescriptions:  .  ALPRAZolam (XANAX) 0.25 MG tablet, TAKE 1 TABLET EVERY DAY AS NEEDED FOR ANXIETY, Disp: 30 tablet, Rfl: 2 .  atorvastatin (LIPITOR) 80 MG tablet, TAKE 1 TABLET (80 MG TOTAL) BY MOUTH NIGHTLY., Disp: 90 tablet, Rfl: 0 .  BLOOD GLUCOSE MONITORING SUPPL, ONETOUCH ULTRA (Device) - Historical Medication Active, Disp: , Rfl:  .  busPIRone (BUSPAR) 10 MG tablet, TAKE 1 TABLET (10 MG TOTAL) BY MOUTH 2 (TWO) TIMES DAILY., Disp: 180 tablet, Rfl: 0 .  Cholecalciferol 1000 UNITS capsule, Take by  mouth., Disp: , Rfl:  .  clopidogrel (PLAVIX) 75 MG tablet, TAKE 1 TABLET (75 MG TOTAL) BY MOUTH DAILY., Disp: 90 tablet, Rfl: 0 .  escitalopram (LEXAPRO) 20 MG tablet, TAKE 1 TABLET (20 MG TOTAL) BY MOUTH DAILY., Disp: 90 tablet, Rfl: 0 .  losartan (COZAAR) 100 MG tablet, TAKE 1 TABLET (100 MG TOTAL) BY MOUTH DAILY., Disp: 90 tablet, Rfl: 0 .  metFORMIN (GLUCOPHAGE) 500 MG tablet, Take 1 tablet (500 mg total) by mouth daily with breakfast., Disp: 90 tablet, Rfl: 0 .  ranitidine (ZANTAC) 300 MG tablet, TAKE 1 TABLET (300 MG TOTAL) BY MOUTH AT BEDTIME., Disp: 90 tablet, Rfl: 0 .  sertraline (ZOLOFT) 25 MG tablet, Take 1 tablet (25 mg total) by mouth at bedtime., Disp: 30 tablet, Rfl: 2  Allergies  Allergen Reactions  . Propoxyphene      Review of Systems  Constitutional: Negative for chills, fever and malaise/fatigue.  Respiratory: Negative for cough and shortness of breath.   Cardiovascular: Negative for chest pain and palpitations.  Musculoskeletal: Negative for back pain, myalgias and neck pain.  Neurological: Negative for focal weakness.      Objective  Vitals:   09/15/16 1000  BP: (!) 142/76  Pulse: (!) 102  Resp: 16  Temp: 98 F (36.7 C)  TempSrc: Oral  SpO2: 98%  Weight: 154 lb 3.2 oz (69.9 kg)  Height: 6\' 1"  (1.854 m)    Physical Exam  Constitutional: He is oriented to person, place, and time and well-developed, well-nourished, and in no distress.  HENT:  Head: Normocephalic and atraumatic.  Cardiovascular: Normal rate, regular rhythm and normal heart sounds.   No murmur heard. Pulmonary/Chest: Effort normal and breath sounds normal. He has no wheezes.  Abdominal: Soft. Bowel sounds are normal. There is no tenderness.  Neurological: He is alert and oriented to person, place, and time.  Psychiatric: Mood, memory, affect and judgment normal.  Nursing note and vitals reviewed.      Assessment & Plan  1. Muscle weakness Notes from physical therapist  reviewed. Patient authorized to return back to work on Monday, 10/03/2016.  2. Essential hypertension Blood pressure elevated, likely from anxiety, recheck in one month   Jyden Kromer [facility] 09/15/2016 10:34 AM

## 2016-09-16 ENCOUNTER — Ambulatory Visit: Payer: BLUE CROSS/BLUE SHIELD

## 2016-09-16 DIAGNOSIS — M6281 Muscle weakness (generalized): Secondary | ICD-10-CM

## 2016-09-16 DIAGNOSIS — R278 Other lack of coordination: Secondary | ICD-10-CM | POA: Diagnosis not present

## 2016-09-16 DIAGNOSIS — R262 Difficulty in walking, not elsewhere classified: Secondary | ICD-10-CM

## 2016-09-16 NOTE — Therapy (Signed)
[place] MAIN [facility] SERVICES [address] [place], [place], [address] Phone: [phone]   Fax:  [phone]  Physical Therapy Treatment  Patient Details  Name: Kenneth Franklin MRN: [id number] Date of Birth: 1946/03/18 Referring Provider: Rogue Jury  Encounter Date: 09/16/2016      PT End of Session - 09/16/16 0850    Visit Number 9   Number of Visits 13   Date for PT Re-Evaluation October 15, 2016   Authorization Type g codes   Authorization Time Period 9/10   PT Start Time 0844   PT Stop Time 0930   PT Time Calculation (min) 46 min   Equipment Utilized During Treatment Gait belt   Activity Tolerance Patient tolerated treatment well   Behavior During Therapy [facility] for tasks assessed/performed      Past Medical History:  Diagnosis Date  . Anxiety   . Depression   . Diabetes mellitus without complication ([place])   . Hyperlipidemia   . Hypertension     Past Surgical History:  Procedure Laterality Date  . CATARACT EXTRACTION Bilateral     Vitals:   09/16/16 0846  BP: (!) 157/92        Subjective Assessment - 09/16/16 0845    Subjective Patient reports he felt sore after the previous treatment. States he went to his 75 who wrote him a letter to go back to work. Reports he's returning to work Oct 03 2016   Pertinent History On 7/30 LLE weakness and difficutly lifting LLE into car.  He went to ED at [facility] who completed CT which was negative and was instructed to follow up with his primary physician.  He remained concerned about symptoms and reports he went to [facility] where he was admitted for 3 days, it was there that he was told he had a CVA. He presents today with residual LLE weakness and impaired coordination. Uses quad cane in community but does not use AD in home.  Still has to pick up LLE to bring it into his car.  Continues to experience numbness LLE, his L knee buckles ~1x/wk.  Has had 3 falls in the past month, says it occurs  when getting out of the shower which he recently renovated and has not had any falls since.  Has a ramp at home so has not trialed ascending/descending steps.  Is concerned about climbing equipment ladders at work.  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  Job duties includes climbing up ladders, lifting up to 50#.  Currently out on disability until at least the end of September.  Denies any changes in vision since CVA, h/o cataract surgery when he was 70 years old.     How long can you walk comfortably? No issues with community ambulation, has not tested to see how far he can walk   Diagnostic tests +MRI for CVA   Patient Stated Goals to be able to complete job duties and return to work      TREATMENT Therapeutic Exercise: Treadmill backwards walking with cueing on foot position and stride length - 5 min New step -- Seat position 10, 5 min level for a warm  SLS bilaterally - 1 min x 2 bilateral LE Sit to stands with airex pad on top of seat -  3 x 20 with cueing to increase speed to encourage cardiovascular endurance ;  without use of arms  Monster walks with RTB around knees - 4 x 43ft bilaterally [employer] with --  8 # 10ft x 4    SpO2: >97%, HR <128 bpm throughout treatment session   *Patient requires sitting rest breaks throughout session secondary to fatigue       PT Education - 09/16/16 0851    Education provided Yes   Education Details Educated on proper technique/form with exercise   Person(s) Educated Patient   Methods Explanation;Demonstration   Comprehension Verbalized understanding;Returned demonstration             PT Long Term Goals - 08/10/16 1132      PT LONG TERM GOAL #1   Title Pt will improve Berg score by at least 5 points to demonstrate decreased risk for falls   Baseline 38/56   Time 6   Period Weeks   Status New     PT LONG TERM  GOAL #2   Title Pt will improve 5xSTS time to 12 sec to demonstrate improved BLE strength   Baseline 18 sec   Time 6   Period Weeks   Status New     PT LONG TERM GOAL #3   Title Pt will improve 10MWT to at least 1.2 m/s to demonstrate improved gait speed   Baseline 0.72 m/s   Time 6   Period Weeks   Status New     PT LONG TERM GOAL #4   Title Pt will improve all areas of LLE strength with deficits on eval by at least 1 grade with MMT   Baseline See evaluation note for detailed report   Time 6   Period Weeks   Status New               Plan - 09/16/16 [id number]    Clinical Impression Statement Focused on performing muscular endurance based exercises so patient can better return to work. Patient's muscular/cardiovascular endurance has improved resulting in decreased fatigue and DOE with exercises. Plan to discharge over next visit.   Rehab Potential Good   Clinical Impairments Affecting Rehab Potential CVA 8/10 with residual symptoms   PT Frequency 2x / week   PT Duration 6 weeks   PT Treatment/Interventions ADLs/Self Care Home Management;Aquatic Therapy;Cryotherapy;Electrical Stimulation;Moist Heat;DME Instruction;Gait training;Stair training;Functional mobility training;Therapeutic activities;Therapeutic exercise;Balance training;Neuromuscular re-education;Patient/family education;Manual techniques;Passive range of motion;Energy conservation;Taping   PT Next Visit Plan Provide pt with information on Healthcare Directives; Have pt complete ABC scale; Balance, strengthening, and coordination exercises   PT Home Exercise Plan Alternating toe taps, single leg balance   Consulted and Agree with Plan of Care Patient      Patient will benefit from skilled therapeutic intervention in order to improve the following deficits and impairments:  Abnormal gait, Decreased balance, Decreased activity tolerance, Decreased coordination, Decreased endurance, Decreased knowledge of use of DME,  Decreased safety awareness, Decreased strength, Difficulty walking, Impaired perceived functional ability, Impaired flexibility, Impaired sensation, Impaired UE functional use, Improper body mechanics, Postural dysfunction  Visit Diagnosis: Difficulty in walking, not elsewhere classified  Muscle weakness (generalized)     Problem List Patient Active Problem List   Diagnosis Date Noted  . Erroneous encounter - disregard 08/02/2016  . Acute ischemic stroke ([place]) 07/07/2016  . Tick bite of flank 06/16/2016  . Grief counseling 06/16/2016  . Fatigue 06/16/2016  . Absent pulse in lower extremity 09/14/2015  . Anxiety and depression 04/21/2015  . Diabetes mellitus type 2, controlled, without complications ([place]) 0000000  .  Acid reflux 04/21/2015  . Calcium blood increased 04/21/2015  . Dyslipidemia 04/21/2015  . HLD (hyperlipidemia) 04/21/2015  . BP (high blood pressure) 04/21/2015    Blythe Stanford, PT DPT 09/16/2016, 9:33 AM  [place] MAIN [facility] SERVICES [address] [place], [place], [address] Phone: [phone]   Fax:  [phone]  Name: Kenneth Franklin MRN: [id number] Date of Birth: 20-Nov-1946

## 2016-09-17 ENCOUNTER — Other Ambulatory Visit: Payer: Self-pay | Admitting: Family Medicine

## 2016-09-17 DIAGNOSIS — Z7189 Other specified counseling: Secondary | ICD-10-CM

## 2016-09-19 ENCOUNTER — Ambulatory Visit: Payer: BLUE CROSS/BLUE SHIELD

## 2016-09-19 ENCOUNTER — Ambulatory Visit: Payer: BLUE CROSS/BLUE SHIELD | Admitting: Occupational Therapy

## 2016-09-19 VITALS — BP 146/90

## 2016-09-19 DIAGNOSIS — R278 Other lack of coordination: Secondary | ICD-10-CM

## 2016-09-19 DIAGNOSIS — R262 Difficulty in walking, not elsewhere classified: Secondary | ICD-10-CM

## 2016-09-19 DIAGNOSIS — M6281 Muscle weakness (generalized): Secondary | ICD-10-CM

## 2016-09-19 NOTE — Therapy (Signed)
[place] MAIN [facility] SERVICES [address] [place], [place], [address] Phone: [phone]   Fax:  [phone]  Physical Therapy Treatment  Patient Details  Name: Kenneth Franklin MRN: [id number] Date of Birth: February 03, 1946 Referring Provider: Rogue Jury  Encounter Date: 09/19/2016      PT End of Session - 09/19/16 1013    Visit Number 10   Number of Visits 13   Date for PT Re-Evaluation 10/10/16   Authorization Type g codes   Authorization Time Period 10/10   PT Start Time 0930   PT Stop Time 1014   PT Time Calculation (min) 44 min   Equipment Utilized During Treatment Gait belt   Activity Tolerance Patient tolerated treatment well   Behavior During Therapy [facility] for tasks assessed/performed      Past Medical History:  Diagnosis Date  . Anxiety   . Depression   . Diabetes mellitus without complication ([place])   . Hyperlipidemia   . Hypertension     Past Surgical History:  Procedure Laterality Date  . CATARACT EXTRACTION Bilateral     Vitals:   09/19/16 0936  BP: (!) 146/90        Subjective Assessment - 09/19/16 0932    Subjective Patient reports no new complaints since the previous treatment session. States he continues to feel stronger.    Pertinent History On 7/30 LLE weakness and difficutly lifting LLE into car.  He went to ED at [facility] who completed CT which was negative and was instructed to follow up with his primary physician.  He remained concerned about symptoms and reports he went to [facility] where he was admitted for 3 days, it was there that he was told he had a CVA. He presents today with residual LLE weakness and impaired coordination. Uses quad cane in community but does not use AD in home.  Still has to pick up LLE to bring it into his car.  Continues to experience numbness LLE, his L knee buckles ~1x/wk.  Has had 3 falls in the past month, says it occurs when getting out of the shower which he recently renovated and  has not had any falls since.  Has a ramp at home so has not trialed ascending/descending steps.  Is concerned about climbing equipment ladders at work.  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  Job duties includes climbing up ladders, lifting up to 50#.  Currently out on disability until at least the end of September.  Denies any changes in vision since CVA, h/o cataract surgery when he was 70 years old.     How long can you walk comfortably? No issues with community ambulation, has not tested to see how far he can walk   Diagnostic tests +MRI for CVA   Patient Stated Goals to be able to complete job duties and return to work   Currently in Pain? No/denies            [facility] PT Assessment - 09/19/16 0942      Strength   Right Hip Flexion 5/5   Right Hip External Rotation  5/5   Right Hip Internal Rotation 5/5   Right Hip ABduction 5/5   Right Hip ADduction 5/5   Left Hip Flexion 4/5   Left Hip External Rotation 4/5   Left Hip Internal Rotation 4/5   Left  Hip ABduction 5/5   Left Hip ADduction 4/5   Right Knee Flexion 5/5   Right Knee Extension 5/5   Left Knee Flexion 4+/5   Left Knee Extension 5/5   Right Ankle Dorsiflexion 5/5   Left Ankle Dorsiflexion 4/5     Standardized Balance Assessment   Standardized Balance Assessment Berg Balance Test;Five Times Sit to Stand   Five times sit to stand comments  11.5 sec   10 Meter Walk 1.4 m/s     Berg Balance Test   Sit to Stand Able to stand without using hands and stabilize independently   Standing Unsupported Able to stand safely 2 minutes   Sitting with Back Unsupported but Feet Supported on Floor or Stool Able to sit safely and securely 2 minutes   Stand to Sit Sits safely with minimal use of hands   Transfers Able to transfer safely, minor use of hands   Standing Unsupported with Eyes Closed Able to stand 10 seconds  safely   Standing Ubsupported with Feet Together Able to place feet together independently and stand 1 minute safely   From Standing, Reach Forward with Outstretched Arm Can reach confidently >25 cm (10")   From Standing Position, Pick up Object from Floor Able to pick up shoe safely and easily   From Standing Position, Turn to Look Behind Over each Shoulder Looks behind from both sides and weight shifts well   Turn 360 Degrees Able to turn 360 degrees safely in 4 seconds or less   Standing Unsupported, Alternately Place Feet on Step/Stool Able to stand independently and safely and complete 8 steps in 20 seconds   Standing Unsupported, One Foot in Front Able to place foot tandem independently and hold 30 seconds   Standing on One Leg Able to lift leg independently and hold > 10 seconds   Total Score 56        TREATMENT Therapeutic Exercise: Treadmill backwards walking with cueing on foot position and stride length - 5 min SLS bilaterally - 30 min x 2 bilateral LE Step taps onto 6" step - x 10 bilaterally  Tandem stance --  2 x 30sec  Sit to stands - x 10 for cueing on speed  Bridges in hooklying - x20 with cueing on glute activation Sidelying hip abduction - x15 Bilaterally  Leg Press - BLE 3 x 15 120#, LLE 3 x 10 75# for cueing on speed and control of movement    SpO2: >97%, HR <120 bpm throughout treatment session   *Patient requires sitting rest breaks throughout session secondary to fatigue       PT Education - 09/19/16 1013    Education provided Yes   Education Details Educated on therapy progression and expectations for next treatment session   Person(s) Educated Patient   Methods Explanation   Comprehension Verbalized understanding             PT Long Term Goals - 09/19/16 1008      PT LONG TERM GOAL #1   Title Pt will improve Berg score by at least 5 points to demonstrate decreased risk for falls   Baseline 38/56, 09/19/16; 56/56   Time 6   Period Weeks    Status New     PT LONG TERM GOAL #2   Title Pt will improve 5xSTS time to 12 sec to demonstrate improved BLE strength   Baseline 18 sec, 09/19/16: 11.5sec   Time 6   Period Weeks   Status Achieved  PT LONG TERM GOAL #3   Title Pt will improve 10MWT to at least 1.2 m/s to demonstrate improved gait speed   Baseline 0.72 m/s 10/02/2016: 1.4 m/s   Time 6   Period Weeks   Status Achieved     PT LONG TERM GOAL #4   Title Pt will improve all areas of LLE strength with deficits on eval by at least 1 grade with MMT   Baseline See evaluation note for detailed report; Oct 02, 2016: Improved by 1 grade    Time 6   Period Weeks   Status Achieved     PT LONG TERM GOAL #5   Title Patient will be independent with HEP to conitnue benefits from therapy after discharge from PT   [place] with verbal and tactile cueing to perform exercises.    Time 1   Period Weeks   Status New               Plan - 10-02-2016 1321    Clinical Impression Statement Performed evaluation today with patient making significant improvements with 5xSTS, 52m walk, and BERG scores. Although patient is improving, he continues to require cueing on exercise performance indicating decreased coordination and control. Patient will benefit from an additional visit to focus on a HEP to best continue benefits of therapy after discharge.   Rehab Potential Good   Clinical Impairments Affecting Rehab Potential CVA 8/10 with residual symptoms   PT Frequency 2x / week   PT Duration 6 weeks   PT Treatment/Interventions ADLs/Self Care Home Management;Aquatic Therapy;Cryotherapy;Electrical Stimulation;Moist Heat;DME Instruction;Gait training;Stair training;Functional mobility training;Therapeutic activities;Therapeutic exercise;Balance training;Neuromuscular re-education;Patient/family education;Manual techniques;Passive range of motion;Energy conservation;Taping   PT Next Visit Plan Provide pt with information on Healthcare  Directives; Have pt complete ABC scale; Balance, strengthening, and coordination exercises   PT Home Exercise Plan Alternating toe taps, single leg balance   Consulted and Agree with Plan of Care Patient      Patient will benefit from skilled therapeutic intervention in order to improve the following deficits and impairments:  Abnormal gait, Decreased balance, Decreased activity tolerance, Decreased coordination, Decreased endurance, Decreased knowledge of use of DME, Decreased safety awareness, Decreased strength, Difficulty walking, Impaired perceived functional ability, Impaired flexibility, Impaired sensation, Impaired UE functional use, Improper body mechanics, Postural dysfunction  Visit Diagnosis: Muscle weakness (generalized)  Other lack of coordination  Difficulty in walking, not elsewhere classified       G-Codes - October 02, 2016 1324    Functional Assessment Tool Used Berg, 10MWT, 5xSTS, MMT, Clinical Judgement   Functional Limitation Mobility: Walking and moving around   Mobility: Walking and Moving Around Current Status [phone]) At least 1 percent but less than 20 percent impaired, limited or restricted   Mobility: Walking and Moving Around Goal Status [phone]) At least 1 percent but less than 20 percent impaired, limited or restricted      Problem List Patient Active Problem List   Diagnosis Date Noted  . Erroneous encounter - disregard 08/02/2016  . Acute ischemic stroke ([place]) 07/07/2016  . Tick bite of flank 06/16/2016  . Grief counseling 06/16/2016  . Fatigue 06/16/2016  . Absent pulse in lower extremity 09/14/2015  . Anxiety and depression 04/21/2015  . Diabetes mellitus type 2, controlled, without complications ([place]) 0000000  . Acid reflux 04/21/2015  . Calcium blood increased 04/21/2015  . Dyslipidemia 04/21/2015  . HLD (hyperlipidemia) 04/21/2015  . BP (high blood pressure) 04/21/2015    Blythe Stanford, PT DPT 2016-10-02, 1:25 PM  [place]  [place] MAIN [facility] SERVICES [address] [place], [place], [address] Phone: [phone]   Fax:  [phone]  Name: Kenneth Franklin MRN: [id number] Date of Birth: 04-Jun-1946

## 2016-09-19 NOTE — Therapy (Signed)
[place] MAIN [facility] SERVICES [address] [place], [place], [address] Phone: [phone]   Fax:  [phone]  Occupational Therapy Treatment  Patient Details  Name: Kenneth Franklin MRN: [id number] Date of Birth: [birth year] No Data Recorded  Encounter Date: 09/19/2016      OT End of Session - 09/19/16 0905    Visit Number 14   Number of Visits 24   Date for OT Re-Evaluation 10/18/16   Authorization Type Medicare G code 14   OT Start Time 0845   OT Stop Time 0930   OT Time Calculation (min) 45 min   Activity Tolerance Patient tolerated treatment well   Behavior During Therapy [facility] for tasks assessed/performed      Past Medical History:  Diagnosis Date  . Anxiety   . Depression   . Diabetes mellitus without complication ([place])   . Hyperlipidemia   . Hypertension     Past Surgical History:  Procedure Laterality Date  . CATARACT EXTRACTION Bilateral     There were no vitals filed for this visit.      Subjective Assessment - 09/19/16 0900    Subjective  Pt. reports he has to go back to the MD on Thursday to sign paperwork to be able to return to work on Nov. 6th.   Pertinent History Patient reports on 06/26/2016 he was getting ready to go to his brothers house and went to get into his truck and was unable to use or pick up his left foot and could not move his arm. He drove 3 miles to his brothers house and His brother transported him to [facility] he was discharged from the ER and then went to [facility] and was hospitalized for 3 to 4 days before being discharged home. He has not received any therapy.  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   Patient Stated Goals "Be back normal and do like I was" Go back to work.   Currently in Pain? No/denies      OT TREATMENT    Neuro muscular re-education:  Pt. Worked on turning, flipping, and stacking 1.5" pegs on the [employer]. Speed and accuracy were emphasized. Pt.  worked on tasks to sustain lateral pinch on resistive tweezers while grasping and moving 2" toothpick sticks from a horizontal flat position to a vertical position in order to place it in the holder. Pt. was able to sustain grasp while positioning and extending the wrist/hand in the necessary alignment needed to place the stick through the top of the holder. Pt. Worked on using tweezers to grasp larger pegs with resistance on a vertical angle with emphasis wrist extension. Pt. performed [facility] skills training to improve speed and dexterity needed for ADL tasks and writing. Pt. demonstrated grasping 1 inch sticks,  inch cylindrical collars, and  inch flat washers on the [employer] pegboard. Pt. performed grasping each item with her 2nd digit and thumb, and storing them in the palm. Pt. presented with difficulty storing  inch objects at a time in the palmar aspect of the hand. Alternating hand movements.  Therapeutic Exercise:  Pt. Worked on the [employer] for 8 min. With constant monitoring of the BUEs. Pt. Worked on changing, and alternating forward reverse position every 2 min. On level 2.5. One rest break was required.                          OT Education - 09/19/16  0904    Education provided Yes   Person(s) Educated Patient   Methods Explanation;Demonstration   Comprehension Verbalized understanding;Returned demonstration             OT Long Term Goals - 09/08/16 0805      OT LONG TERM GOAL #1   Title Patient will demonstrate improvement in fine motor coordination as evidenced of nine hole peg test on the left by 10 seconds to be able to complete buttons independently.    Baseline 34 sec at eval, 26 secs at 10th visit   Time 6   Period Weeks     OT LONG TERM GOAL #2   Title Patient will complete meal preparation with modified independence.    Baseline step daughter bringing in meals   Time 6   Period Weeks   Status On-going     OT LONG TERM GOAL #3   Title Patient  will improve left UE strength by 1 mm grade to return to work with modifications as needed   Time 6   Period Weeks   Status Partially Met     OT LONG TERM GOAL #4   Title Patient will be independent with home exercise program.   Baseline none   Time 6   Period Weeks   Status On-going     OT LONG TERM GOAL #5   Title Patient will be modified independent with all self care tasks   Baseline slow to complete, assist with buttons   Time 6   Status Partially Met               Plan - 09/19/16 0905    Clinical Impression Statement Pt. reports he feels as though his left hand is getting back to normal, except for numbness/tingling in the tip of his thumb, and second digit. Pt. is making excellent progress overall. Pt. is improvng with left hand FMC, speed, and dexterity skills. Pt. continues to benefit from skilled OT services to work on improving these skills to be able to complete ADLs, IADLs, and work related tasks as pt. plans to return to work within the next few weeks.   Rehab Potential Good   Clinical Impairments Affecting Rehab Potential positive:  motivation   OT Frequency 2x / week   OT Duration 6 weeks   OT Treatment/Interventions Self-care/ADL training;Therapeutic exercise;Neuromuscular education;Therapeutic exercises;Patient/family education;Functional Mobility Training;DME and/or AE instruction;Manual Therapy;Therapeutic activities;Balance training   Consulted and Agree with Plan of Care Patient      Patient will benefit from skilled therapeutic intervention in order to improve the following deficits and impairments:  Decreased knowledge of use of DME, Impaired flexibility, Pain, Decreased coordination, Decreased mobility, Decreased activity tolerance, Decreased endurance, Decreased range of motion, Decreased strength, Decreased balance, Difficulty walking, Impaired UE functional use  Visit Diagnosis: Muscle weakness (generalized)  Other lack of  coordination    Problem List Patient Active Problem List   Diagnosis Date Noted  . Erroneous encounter - disregard 08/02/2016  . Acute ischemic stroke ([place]) 07/07/2016  . Tick bite of flank 06/16/2016  . Grief counseling 06/16/2016  . Fatigue 06/16/2016  . Absent pulse in lower extremity 09/14/2015  . Anxiety and depression 04/21/2015  . Diabetes mellitus type 2, controlled, without complications ([place]) 96/02/5408  . Acid reflux 04/21/2015  . Calcium blood increased 04/21/2015  . Dyslipidemia 04/21/2015  . HLD (hyperlipidemia) 04/21/2015  . BP (high blood pressure) 04/21/2015    Harrel Carina, MS, OTR/L 09/19/2016, 9:37 AM  [place]  [place] MAIN [facility] SERVICES [place], [place], [address] Phone: [phone]   Fax:  [phone]  Name: Kenneth Franklin MRN: [id number] Date of Birth: Apr 15, 1946

## 2016-09-19 NOTE — Patient Instructions (Signed)
OT TREATMENT    Neuro muscular re-education:  Pt. Worked on turning, flipping, and stacking 1.5" pegs on the [employer]. Speed and accuracy were emphasized. Pt. worked on tasks to sustain lateral pinch on resistive tweezers while grasping and moving 2" toothpick sticks from a horizontal flat position to a vertical position in order to place it in the holder. Pt. was able to sustain grasp while positioning and extending the wrist/hand in the necessary alignment needed to place the stick through the top of the holder. Pt. Worked on using tweezers to grasp larger pegs with resistance on a vertical angle with emphasis wrist extension. Pt. performed [facility] skills training to improve speed and dexterity needed for ADL tasks and writing. Pt. demonstrated grasping 1 inch sticks,  inch cylindrical collars, and  inch flat washers on the [employer] pegboard. Pt. performed grasping each item with her 2nd digit and thumb, and storing them in the palm. Pt. presented with difficulty storing  inch objects at a time in the palmar aspect of the hand. Alternating hand movements.  Therapeutic Exercise:  Pt. Worked on the [employer] for 8 min. With constant monitoring of the BUEs. Pt. Worked on changing, and alternating forward reverse position every 2 min. On level 2.5. One rest break was required.

## 2016-09-21 ENCOUNTER — Ambulatory Visit: Payer: BLUE CROSS/BLUE SHIELD

## 2016-09-21 ENCOUNTER — Ambulatory Visit: Payer: BLUE CROSS/BLUE SHIELD | Admitting: Occupational Therapy

## 2016-09-21 ENCOUNTER — Encounter: Payer: Self-pay | Admitting: Occupational Therapy

## 2016-09-21 DIAGNOSIS — M6281 Muscle weakness (generalized): Secondary | ICD-10-CM

## 2016-09-21 DIAGNOSIS — R278 Other lack of coordination: Secondary | ICD-10-CM

## 2016-09-21 DIAGNOSIS — R262 Difficulty in walking, not elsewhere classified: Secondary | ICD-10-CM

## 2016-09-21 NOTE — Therapy (Signed)
[place] MAIN [facility] SERVICES [address] [place], [place], [address] Phone: [phone]   Fax:  [phone]  Physical Therapy Treatment/Discharge Summary   Patient Details  Name: Kenneth Franklin MRN: [id number] Date of Birth: [birth year] Referring Provider: Rogue Jury  Encounter Date: 10/02/2016   Pt attended 11 out of 11 visits from 08/10/16 to 2016-10-02. Patient has met all long term goals and will be discharged from PT      PT End of Session - 02-Oct-2016 0957    Visit Number 11   Number of Visits 13   Date for PT Re-Evaluation 10/02/16   Authorization Type g codes   Authorization Time Period 11/20   PT Start Time 0930   PT Stop Time 1010   PT Time Calculation (min) 40 min   Equipment Utilized During Treatment Gait belt   Activity Tolerance Patient tolerated treatment well   Behavior During Therapy WFL for tasks assessed/performed      Past Medical History:  Diagnosis Date  . Anxiety   . Depression   . Diabetes mellitus without complication ([place])   . Hyperlipidemia   . Hypertension     Past Surgical History:  Procedure Laterality Date  . CATARACT EXTRACTION Bilateral     There were no vitals filed for this visit.      Subjective Assessment - 10/02/16 0934    Subjective Patient reports he feels ready for discharge and has gotten much stronger since the beginning of therapy.    Pertinent History On 7/30 LLE weakness and difficutly lifting LLE into car.  He went to ED at [facility] who completed CT which was negative and was instructed to follow up with his primary physician.  He remained concerned about symptoms and reports he went to [facility] where he was admitted for 3 days, it was there that he was told he had a CVA. He presents today with residual LLE weakness and impaired coordination. Uses quad cane in community but does not use AD in home.  Still has to pick up LLE to bring it into his car.  Continues to experience numbness LLE,  his L knee buckles ~1x/wk.  Has had 3 falls in the past month, says it occurs when getting out of the shower which he recently renovated and has not had any falls since.  Has a ramp at home so has not trialed ascending/descending steps.  Is concerned about climbing equipment ladders at work.  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  Job duties includes climbing up ladders, lifting up to 50#.  Currently out on disability until at least the end of September.  Denies any changes in vision since CVA, h/o cataract surgery when he was 70 years old.     How long can you walk comfortably? No issues with community ambulation, has not tested to see how far he can walk   Diagnostic tests +MRI for CVA   Patient Stated Goals to be able to complete job duties and return to work      TREATMENT Therapeutic Exercise: All exercises performed were added to HEP for discharge Treadmill backwards walking with cueing on foot position and stride length - 5 min SLS bilaterally - 1 min x 2 bilateral LE Tandem stance --  1 min x 2 bilateral LE Sit to stands - 2 x 20 for cueing  on speed  Standing Hip abduction - 2 x 20 bilaterally  Standing hip ext - 2 x20 bilaterally cueing on proper muscular activation and leg positioning [employer] Walks with GTB - 4 x 24f cueing on knee and joint positioning when performing          PT Education - 09/21/16 0956    Education provided Yes   Education Details HEP, joint positioning during exercise    Person(s) Educated Patient   Methods Explanation;Demonstration   Comprehension Verbalized understanding;Returned demonstration             PT Long Term Goals - 09/21/16 1002      PT LONG TERM GOAL #1   Title Pt will improve Berg score by at least 5 points to demonstrate decreased risk for falls   Baseline 38/56, 09/19/16; 56/56   Time 6   Period Weeks    Status New     PT LONG TERM GOAL #2   Title Pt will improve 5xSTS time to 12 sec to demonstrate improved BLE strength   Baseline 18 sec, 09/19/16: 11.5sec   Time 6   Period Weeks   Status Achieved     PT LONG TERM GOAL #3   Title Pt will improve 10MWT to at least 1.2 m/s to demonstrate improved gait speed   Baseline 0.72 m/s 09/19/16: 1.4 m/s   Time 6   Period Weeks   Status Achieved     PT LONG TERM GOAL #4   Title Pt will improve all areas of LLE strength with deficits on eval by at least 1 grade with MMT   Baseline See evaluation note for detailed report; 09/19/16: Improved by 1 grade    Time 6   Period Weeks   Status Achieved     PT LONG TERM GOAL #5   Title Patient will be independent with HEP to conitnue benefits from therapy after discharge from PT   Baseline Requires minA with verbal and tactile cueing to perform exercises. 09/21/16 Independent with exercise performance and progression   Time 1   Period Weeks   Status Achieved               Plan - 09/21/16 0957    Clinical Impression Statement Patient demonstrates significant improvement with 5xSTS, 141malk, and BERG scores indicating significant decrease in fall risk and improved muscular strength. Patient has met all long term goals and is to be discharged from therapy.    Rehab Potential Good   Clinical Impairments Affecting Rehab Potential CVA 8/10 with residual symptoms   PT Frequency 2x / week   PT Duration 6 weeks   PT Treatment/Interventions ADLs/Self Care Home Management;Aquatic Therapy;Cryotherapy;Electrical Stimulation;Moist Heat;DME Instruction;Gait training;Stair training;Functional mobility training;Therapeutic activities;Therapeutic exercise;Balance training;Neuromuscular re-education;Patient/family education;Manual techniques;Passive range of motion;Energy conservation;Taping   PT Next Visit Plan Provide pt with information on Healthcare Directives; Have pt complete ABC scale; Balance,  strengthening, and coordination exercises   PT Home Exercise Plan Alternating toe taps, single leg balance   Consulted and Agree with Plan of Care Patient      Patient will benefit from skilled therapeutic intervention in order to improve the following deficits and impairments:  Abnormal gait, Decreased balance, Decreased activity tolerance, Decreased coordination, Decreased endurance, Decreased knowledge of use of DME, Decreased safety awareness, Decreased strength, Difficulty walking, Impaired perceived functional ability, Impaired flexibility, Impaired sensation, Impaired UE functional use, Improper body mechanics, Postural dysfunction  Visit Diagnosis: Muscle weakness (generalized)  Other lack of coordination  Difficulty  in walking, not elsewhere classified       G-Codes - 2016-09-28 1003    Functional Assessment Tool Used Berg, 10MWT, 5xSTS, MMT, Clinical Judgement   Functional Limitation Mobility: Walking and moving around   Mobility: Walking and Moving Around Current Status [phone]) At least 1 percent but less than 20 percent impaired, limited or restricted   Mobility: Walking and Moving Around Goal Status [phone]) At least 1 percent but less than 20 percent impaired, limited or restricted   Mobility: Walking and Moving Around Discharge Status [phone]) At least 1 percent but less than 20 percent impaired, limited or restricted      Problem List Patient Active Problem List   Diagnosis Date Noted  . Erroneous encounter - disregard 08/02/2016  . Acute ischemic stroke ([place]) 07/07/2016  . Tick bite of flank 06/16/2016  . Grief counseling 06/16/2016  . Fatigue 06/16/2016  . Absent pulse in lower extremity 09/14/2015  . Anxiety and depression 04/21/2015  . Diabetes mellitus type 2, controlled, without complications ([place]) 67/25/5001  . Acid reflux 04/21/2015  . Calcium blood increased 04/21/2015  . Dyslipidemia 04/21/2015  . HLD (hyperlipidemia) 04/21/2015  . BP (high blood  pressure) 04/21/2015    Blythe Stanford, PT DPT 28-Sep-2016, 10:06 AM  [place] MAIN [facility] SERVICES [place], [place], [address] Phone: [phone]   Fax:  [phone]  Name: Kenneth Franklin MRN: [id number] Date of Birth: February 12, 1946

## 2016-09-22 ENCOUNTER — Ambulatory Visit: Payer: Medicare Other | Admitting: Family Medicine

## 2016-09-25 NOTE — Therapy (Signed)
[place] MAIN [facility] SERVICES [address] [place], [place], [address] Phone: [phone]   Fax:  [phone]  Occupational Therapy Treatment  Patient Details  Name: Kenneth Franklin MRN: [id number] Date of Birth: Feb 26, 1946 No Data Recorded  Encounter Date: 09/21/2016      OT End of Session - 09/25/16 1224    Visit Number 15   Number of Visits 24   Date for OT Re-Evaluation 10/18/16   Authorization Type Medicare G code 15   OT Start Time 0845   OT Stop Time 0930   OT Time Calculation (min) 45 min   Activity Tolerance Patient tolerated treatment well   Behavior During Therapy [facility] for tasks assessed/performed      Past Medical History:  Diagnosis Date  . Anxiety   . Depression   . Diabetes mellitus without complication ([place])   . Hyperlipidemia   . Hypertension     Past Surgical History:  Procedure Laterality Date  . CATARACT EXTRACTION Bilateral     There were no vitals filed for this visit.      Subjective Assessment - 09/25/16 1219    Subjective  Patient reports he went by his work yesterday and talked to the boss.  His plan is to return to work on Nov 6th, 2017, has to check in with the work physician first prior to starting.     Pertinent History Patient reports on 06/26/2016 he was getting ready to go to his brothers house and went to get into his truck and was unable to use or pick up his left foot and could not move his arm. He drove 3 miles to his brothers house and His brother transported him to [facility] he was discharged from the ER and then went to [facility] and was hospitalized for 3 to 4 days before being discharged home. He has not received any therapy.  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   Patient Stated Goals "Be back normal and do like I was" Go back to work.   Currently in Pain? No/denies                      OT Treatments/Exercises (OP) - 09/25/16 1219      Fine Motor  Coordination   Other Fine Motor Exercises Patient seen for focus on coordination activities with left hand for manipulation of coins with cues for using translatory movements of the hand as well as using the hand for storage.  100 pegboard with emphasis on turning/flipping objects with speed and dexterity.        Neurological Re-education Exercises   Other Exercises 1 Patient seen for LUE strengthening exercises with 4# weight for shoulder flexion, ABD/ADD, chest press, elbow flexion/extension, forwards and backwards circles for 15 reps for 2 sets.  Upper body reciprocal resistive ergometer for 8 minutes total, resistance of 3.8 to 4.0, forwards/backwards with therapist in constant attendance to adjust setting, ensure grip and provide cues as needed.  one short rest break mid way.                  OT Education - 09/25/16 1223    Education Details HEP, strength and coordination   Person(s) Educated Patient   Methods Explanation;Demonstration;Verbal cues   Comprehension Verbal cues required;Returned demonstration;Verbalized understanding             OT Long Term Goals - 09/08/16 0805      OT LONG  TERM GOAL #1   Title Patient will demonstrate improvement in fine motor coordination as evidenced of nine hole peg test on the left by 10 seconds to be able to complete buttons independently.    Baseline 34 sec at eval, 26 secs at 10th visit   Time 6   Period Weeks     OT LONG TERM GOAL #2   Title Patient will complete meal preparation with modified independence.    Baseline step daughter bringing in meals   Time 6   Period Weeks   Status On-going     OT LONG TERM GOAL #3   Title Patient will improve left UE strength by 1 mm grade to return to work with modifications as needed   Time 6   Period Weeks   Status Partially Met     OT LONG TERM GOAL #4   Title Patient will be independent with home exercise program.   Baseline none   Time 6   Period Weeks   Status On-going      OT LONG TERM GOAL #5   Title Patient will be modified independent with all self care tasks   Baseline slow to complete, assist with buttons   Time 6   Status Partially Met               Plan - 09/25/16 1224    Clinical Impression Statement Patient reports he is pleased with his progress and feels his left arm and hand are getting better and he will be able to do necessary tasks at work when he returns.  He is participating in tasks at home and in his workshop and has been working on his home program.  Will continue to assess and address any tasks required for work in the next few sessions with return to work in the next couple weeks.     Rehab Potential Good   Clinical Impairments Affecting Rehab Potential positive:  motivation   OT Frequency 2x / week   OT Duration 6 weeks   OT Treatment/Interventions Self-care/ADL training;Therapeutic exercise;Neuromuscular education;Therapeutic exercises;Patient/family education;Functional Mobility Training;DME and/or AE instruction;Manual Therapy;Therapeutic activities;Balance training   Consulted and Agree with Plan of Care Patient      Patient will benefit from skilled therapeutic intervention in order to improve the following deficits and impairments:  Decreased knowledge of use of DME, Impaired flexibility, Pain, Decreased coordination, Decreased mobility, Decreased activity tolerance, Decreased endurance, Decreased range of motion, Decreased strength, Decreased balance, Difficulty walking, Impaired UE functional use  Visit Diagnosis: Muscle weakness (generalized)  Other lack of coordination    Problem List Patient Active Problem List   Diagnosis Date Noted  . Erroneous encounter - disregard 08/02/2016  . Acute ischemic stroke ([place]) 07/07/2016  . Tick bite of flank 06/16/2016  . Grief counseling 06/16/2016  . Fatigue 06/16/2016  . Absent pulse in lower extremity 09/14/2015  . Anxiety and depression 04/21/2015  . Diabetes  mellitus type 2, controlled, without complications ([place]) 06/77/0340  . Acid reflux 04/21/2015  . Calcium blood increased 04/21/2015  . Dyslipidemia 04/21/2015  . HLD (hyperlipidemia) 04/21/2015  . BP (high blood pressure) 04/21/2015   Kareem Aul T Shelsy Seng, OTR/L, CLT  Aigner Horseman 09/25/2016, 12:27 PM  [place] MAIN [facility] SERVICES [address] [place], [place], [address] Phone: [phone]   Fax:  [phone]  Name: WADIE LIEW MRN: [id number] Date of Birth: [birth year]

## 2016-09-27 ENCOUNTER — Ambulatory Visit: Payer: BLUE CROSS/BLUE SHIELD | Admitting: Occupational Therapy

## 2016-09-27 ENCOUNTER — Ambulatory Visit: Payer: BLUE CROSS/BLUE SHIELD

## 2016-09-27 ENCOUNTER — Encounter: Payer: Self-pay | Admitting: Occupational Therapy

## 2016-09-27 DIAGNOSIS — M6281 Muscle weakness (generalized): Secondary | ICD-10-CM

## 2016-09-27 DIAGNOSIS — R278 Other lack of coordination: Secondary | ICD-10-CM | POA: Diagnosis not present

## 2016-09-27 NOTE — Therapy (Signed)
[place] [facility] MAIN [facility] SERVICES [address] [place], [place], [address] Phone: [phone]   Fax:  [phone]  Occupational Therapy Treatment  Patient Details  Name: Kenneth Franklin MRN: [id number] Date of Birth: February 24, 1946 No Data Recorded  Encounter Date: 09/27/2016      OT End of Session - 09/27/16 1740    Visit Number 16   Number of Visits 24   Date for OT Re-Evaluation 10/18/16   Authorization Type Medicare G code 16   OT Start Time 0845   OT Stop Time 0930   OT Time Calculation (min) 45 min   Activity Tolerance Patient tolerated treatment well   Behavior During Therapy [facility] for tasks assessed/performed      Past Medical History:  Diagnosis Date  . Anxiety   . Depression   . Diabetes mellitus without complication ([facility])   . Hyperlipidemia   . Hypertension     Past Surgical History:  Procedure Laterality Date  . CATARACT EXTRACTION Bilateral     There were no vitals filed for this visit.      Subjective Assessment - 09/27/16 0859    Subjective  Patient reports he will be going back to work on Monday, has to go see the doctor associated with his work.     Pertinent History Patient reports on 06/26/2016 he was getting ready to go to his brothers house and went to get into his truck and was unable to use or pick up his left foot and could not move his arm. He drove 3 miles to his brothers house and His brother transported him to [facility] he was discharged from the ER and then went to [facility] and was hospitalized for 3 to 4 days before being discharged home. He has not received any therapy.  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   Patient Stated Goals "Be back normal and do like I was" Go back to work.   Currently in Pain? No/denies   Multiple Pain Sites No                      OT Treatments/Exercises (OP) - 09/27/16 1736      Fine Motor Coordination   Other Fine Motor Exercises Patient  seen this date for manipulation of checker sized objects, picking up from flat surface, moving towards palm, using the hand for storage for 2 sets of 42 reps.  Manipulation of grooved pegs with cues for isolated finger movements, emphasis on speed and dexterity.  Manipulation of small toothpicks from table with fingers, oppositional movement to index and middle finger, also completed set with use of tweezers and placing toothpicks into small slotted container with cues for prehension patterns.      Neurological Re-education Exercises   Other Exercises 1 Grip strength with 23.4# for 25 reps then advanced to 28.9# for 25 reps.                  OT Education - 09/27/16 1740    Education provided Yes   Education Details manipulation skills for functional use of the hand, tool use   Person(s) Educated Patient   Methods Explanation;Demonstration;Verbal cues   Comprehension Verbal cues required;Returned demonstration;Verbalized understanding             OT Long Term Goals - 09/08/16 0805      OT LONG TERM GOAL #1   Title Patient will demonstrate improvement in fine motor coordination as  evidenced of nine hole peg test on the left by 10 seconds to be able to complete buttons independently.    Baseline 34 sec at eval, 26 secs at 10th visit   Time 6   Period Weeks     OT LONG TERM GOAL #2   Title Patient will complete meal preparation with modified independence.    Baseline step daughter bringing in meals   Time 6   Period Weeks   Status On-going     OT LONG TERM GOAL #3   Title Patient will improve left UE strength by 1 mm grade to return to work with modifications as needed   Time 6   Period Weeks   Status Partially Met     OT LONG TERM GOAL #4   Title Patient will be independent with home exercise program.   Baseline none   Time 6   Period Weeks   Status On-going     OT LONG TERM GOAL #5   Title Patient will be modified independent with all self care tasks    Baseline slow to complete, assist with buttons   Time 6   Status Partially Met               Plan - 09/27/16 1740    Clinical Impression Statement Patient continues to make excellent progress with left hand manipulation, strength and coordination skills and is planning to go back to work next week.  Will plan to reassess patient next session, update goals and potentially plan to discharge after instruction on final HEP.     Rehab Potential Good   Clinical Impairments Affecting Rehab Potential positive:  motivation   OT Frequency 2x / week   OT Duration 6 weeks   OT Treatment/Interventions Self-care/ADL training;Therapeutic exercise;Neuromuscular education;Therapeutic exercises;Patient/family education;Functional Mobility Training;DME and/or AE instruction;Manual Therapy;Therapeutic activities;Balance training   Consulted and Agree with Plan of Care Patient      Patient will benefit from skilled therapeutic intervention in order to improve the following deficits and impairments:  Decreased knowledge of use of DME, Impaired flexibility, Pain, Decreased coordination, Decreased mobility, Decreased activity tolerance, Decreased endurance, Decreased range of motion, Decreased strength, Decreased balance, Difficulty walking, Impaired UE functional use  Visit Diagnosis: Muscle weakness (generalized)  Other lack of coordination    Problem List Patient Active Problem List   Diagnosis Date Noted  . Erroneous encounter - disregard 08/02/2016  . Acute ischemic stroke ([place]) 07/07/2016  . Tick bite of flank 06/16/2016  . Grief counseling 06/16/2016  . Fatigue 06/16/2016  . Absent pulse in lower extremity 09/14/2015  . Anxiety and depression 04/21/2015  . Diabetes mellitus type 2, controlled, without complications ([place]) 78/71/8367  . Acid reflux 04/21/2015  . Calcium blood increased 04/21/2015  . Dyslipidemia 04/21/2015  . HLD (hyperlipidemia) 04/21/2015  . BP (high blood pressure)  04/21/2015   Quantavius Humm T Tomasita Morrow, OTR/L, CLT  Analina Filla 09/27/2016, 5:43 PM  [place] MAIN [facility] SERVICES [address] [place], [place], [address] Phone: [phone]   Fax:  [phone]  Name: ABDIEL BLACKERBY MRN: [id number] Date of Birth: Nov 17, 1946

## 2016-09-29 ENCOUNTER — Ambulatory Visit: Payer: BLUE CROSS/BLUE SHIELD | Attending: Nurse Practitioner | Admitting: Occupational Therapy

## 2016-09-29 ENCOUNTER — Ambulatory Visit: Payer: BLUE CROSS/BLUE SHIELD

## 2016-09-29 DIAGNOSIS — M6281 Muscle weakness (generalized): Secondary | ICD-10-CM | POA: Insufficient documentation

## 2016-09-29 DIAGNOSIS — R278 Other lack of coordination: Secondary | ICD-10-CM | POA: Diagnosis present

## 2016-10-04 ENCOUNTER — Ambulatory Visit: Payer: Medicare Other | Admitting: Family Medicine

## 2016-10-06 ENCOUNTER — Encounter: Payer: Self-pay | Admitting: Occupational Therapy

## 2016-10-06 NOTE — Therapy (Signed)
[place] MAIN [facility] SERVICES [address] [place], [place], [address] Phone: [phone]   Fax:  [phone]  Occupational Therapy Treatment/Discharge Summary  Patient Details  Name: Kenneth Franklin MRN: [id number] Date of Birth: [birth year] No Data Recorded  Encounter Date: 09/29/2016      OT End of Session - 10/06/16 2046    Visit Number 17   Number of Visits 24   Date for OT Re-Evaluation 10/18/16   Authorization Type Medicare G code 17   OT Start Time 0845   OT Stop Time 0931   OT Time Calculation (min) 46 min   Activity Tolerance Patient tolerated treatment well   Behavior During Therapy [facility] for tasks assessed/performed      Past Medical History:  Diagnosis Date  . Anxiety   . Depression   . Diabetes mellitus without complication ([place])   . Hyperlipidemia   . Hypertension     Past Surgical History:  Procedure Laterality Date  . CATARACT EXTRACTION Bilateral     There were no vitals filed for this visit.      Subjective Assessment - 10/06/16 2040    Subjective  Patient reports he feels he is ready to go back to work and will be able to do everything he needs to do.  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    Pertinent History Patient reports on 06/26/2016 he was getting ready to go to his brothers house and went to get into his truck and was unable to use or pick up his left foot and could not move his arm. He drove 3 miles to his brothers house and His brother transported him to [facility] he was discharged from the ER and then went to [facility] and was hospitalized for 3 to 4 days before being discharged home. He has not received any therapy.  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   Patient Stated Goals "Be back normal and do like I was" Go back to work.   Currently in Pain? No/denies   Multiple Pain Sites No                      OT  Treatments/Exercises (OP) - 10/06/16 2041      Fine Motor Coordination   Other Fine Motor Exercises Patient seen for finalization of coordination HEP with written instructions, therapist and patient demo.  Reassessment of 9 hole peg test:  left 24 secs, right 23 secs.       Neurological Re-education Exercises   Other Exercises 1 Patient seen for finalization of HEP for UE strengthening exercises, speed, dexterity, Red theraband ex,  tennis ball exercises, arm and shoulder exercises along with functional hand exercises.  Patient able to complete with modified independence using handouts as a guide.   Other Exercises 2 Patient seen for reassessment of grip right hand 63#, left 64#.  Left lateral pinch 12#, 3 point pinch 16#, 2 point pinch 11#.  Strength 5/5 overall BUEs                OT Education - 10/06/16 2046    Education provided Yes   Education Details HEP   Person(s) Educated Patient   Methods Explanation;Demonstration;Verbal cues   Comprehension Verbal cues required;Returned demonstration;Verbalized understanding             OT Long Term Goals - 10/06/16 2049  OT LONG TERM GOAL #1   Title Patient will demonstrate improvement in fine motor coordination as evidenced of nine hole peg test on the left by 10 seconds to be able to complete buttons independently.    Baseline 34 sec at eval, 26 secs at 10th visit, 24 secs at dc   Time 6   Period Weeks   Status Achieved     OT LONG TERM GOAL #2   Title Patient will complete meal preparation with modified independence.    Baseline step daughter bringing in meals   Time 6   Period Weeks   Status Achieved     OT LONG TERM GOAL #3   Title Patient will improve left UE strength by 1 mm grade to return to work with modifications as needed   Time 6   Period Weeks   Status Achieved     OT LONG TERM GOAL #4   Title Patient will be independent with home exercise program.   Time 6   Period Weeks   Status Achieved      OT LONG TERM GOAL #5   Title Patient will be modified independent with all self care tasks   Time 6   Status Achieved               Plan - 10/06/16 2047    Clinical Impression Statement Patient has made excellent progress and has met goals.  Patient able to demonstrate performance of home exercise program independently using handouts as a guide.  He is planning to go back to work next week.  No further OT needs at this time, therefore, discharge from OT services.     Rehab Potential Good   Clinical Impairments Affecting Rehab Potential positive:  motivation   OT Frequency 2x / week   OT Duration 6 weeks   OT Treatment/Interventions Self-care/ADL training;Therapeutic exercise;Neuromuscular education;Therapeutic exercises;Patient/family education;Functional Mobility Training;DME and/or AE instruction;Manual Therapy;Therapeutic activities;Balance training   Consulted and Agree with Plan of Care Patient      Patient will benefit from skilled therapeutic intervention in order to improve the following deficits and impairments:  Decreased knowledge of use of DME, Impaired flexibility, Pain, Decreased coordination, Decreased mobility, Decreased activity tolerance, Decreased endurance, Decreased range of motion, Decreased strength, Decreased balance, Difficulty walking, Impaired UE functional use  Visit Diagnosis: Muscle weakness (generalized)  Other lack of coordination    Problem List Patient Active Problem List   Diagnosis Date Noted  . Erroneous encounter - disregard 08/02/2016  . Acute ischemic stroke ([place]) 07/07/2016  . Tick bite of flank 06/16/2016  . Grief counseling 06/16/2016  . Fatigue 06/16/2016  . Absent pulse in lower extremity 09/14/2015  . Anxiety and depression 04/21/2015  . Diabetes mellitus type 2, controlled, without complications ([place]) 54/27/0623  . Acid reflux 04/21/2015  . Calcium blood increased 04/21/2015  . Dyslipidemia 04/21/2015  . HLD  (hyperlipidemia) 04/21/2015  . BP (high blood pressure) 04/21/2015   Noa Galvao T Jhonatan Lomeli, OTR/L, CLT Nishita Isaacks 10/06/2016, 8:52 PM  [place] MAIN [facility] SERVICES [address] [place], [place], [address] Phone: [phone]   Fax:  [phone]  Name: Kenneth Franklin MRN: [id number] Date of Birth: [birth year]

## 2016-10-26 ENCOUNTER — Other Ambulatory Visit: Payer: Self-pay | Admitting: Family Medicine

## 2016-10-26 DIAGNOSIS — F419 Anxiety disorder, unspecified: Principal | ICD-10-CM

## 2016-10-26 DIAGNOSIS — F329 Major depressive disorder, single episode, unspecified: Secondary | ICD-10-CM

## 2016-11-12 ENCOUNTER — Other Ambulatory Visit: Payer: Self-pay | Admitting: Family Medicine

## 2016-11-12 DIAGNOSIS — I639 Cerebral infarction, unspecified: Secondary | ICD-10-CM

## 2016-11-14 NOTE — Telephone Encounter (Signed)
Medication has been refilled and sent to [place]

## 2016-12-19 ENCOUNTER — Other Ambulatory Visit: Payer: Self-pay | Admitting: Family Medicine

## 2016-12-19 DIAGNOSIS — Z7189 Other specified counseling: Secondary | ICD-10-CM

## 2016-12-19 DIAGNOSIS — E119 Type 2 diabetes mellitus without complications: Secondary | ICD-10-CM | POA: Diagnosis not present

## 2016-12-19 DIAGNOSIS — H2702 Aphakia, left eye: Secondary | ICD-10-CM | POA: Diagnosis not present

## 2016-12-19 DIAGNOSIS — Z961 Presence of intraocular lens: Secondary | ICD-10-CM | POA: Diagnosis not present

## 2016-12-27 DIAGNOSIS — F418 Other specified anxiety disorders: Secondary | ICD-10-CM | POA: Diagnosis not present

## 2016-12-27 DIAGNOSIS — E785 Hyperlipidemia, unspecified: Secondary | ICD-10-CM | POA: Diagnosis not present

## 2016-12-27 DIAGNOSIS — E119 Type 2 diabetes mellitus without complications: Secondary | ICD-10-CM | POA: Diagnosis not present

## 2016-12-27 DIAGNOSIS — H269 Unspecified cataract: Secondary | ICD-10-CM | POA: Diagnosis not present

## 2016-12-27 DIAGNOSIS — Z6820 Body mass index (BMI) 20.0-20.9, adult: Secondary | ICD-10-CM | POA: Diagnosis not present

## 2016-12-27 DIAGNOSIS — F17200 Nicotine dependence, unspecified, uncomplicated: Secondary | ICD-10-CM | POA: Diagnosis not present

## 2017-01-10 DIAGNOSIS — Z9841 Cataract extraction status, right eye: Secondary | ICD-10-CM | POA: Diagnosis not present

## 2017-01-10 DIAGNOSIS — H2702 Aphakia, left eye: Secondary | ICD-10-CM | POA: Diagnosis not present

## 2017-01-10 DIAGNOSIS — Z961 Presence of intraocular lens: Secondary | ICD-10-CM | POA: Diagnosis not present

## 2017-01-18 DIAGNOSIS — Z6821 Body mass index (BMI) 21.0-21.9, adult: Secondary | ICD-10-CM | POA: Diagnosis not present

## 2017-01-18 DIAGNOSIS — L989 Disorder of the skin and subcutaneous tissue, unspecified: Secondary | ICD-10-CM | POA: Diagnosis not present

## 2017-01-24 DIAGNOSIS — C44629 Squamous cell carcinoma of skin of left upper limb, including shoulder: Secondary | ICD-10-CM | POA: Diagnosis not present

## 2017-01-24 DIAGNOSIS — D044 Carcinoma in situ of skin of scalp and neck: Secondary | ICD-10-CM | POA: Diagnosis not present

## 2017-01-24 DIAGNOSIS — D492 Neoplasm of unspecified behavior of bone, soft tissue, and skin: Secondary | ICD-10-CM | POA: Diagnosis not present

## 2017-01-24 DIAGNOSIS — L578 Other skin changes due to chronic exposure to nonionizing radiation: Secondary | ICD-10-CM | POA: Diagnosis not present

## 2017-01-25 ENCOUNTER — Other Ambulatory Visit: Payer: Self-pay | Admitting: Family Medicine

## 2017-01-25 DIAGNOSIS — F329 Major depressive disorder, single episode, unspecified: Secondary | ICD-10-CM

## 2017-01-25 DIAGNOSIS — F419 Anxiety disorder, unspecified: Principal | ICD-10-CM

## 2017-02-03 DIAGNOSIS — E119 Type 2 diabetes mellitus without complications: Secondary | ICD-10-CM | POA: Diagnosis not present

## 2017-02-03 DIAGNOSIS — Z79899 Other long term (current) drug therapy: Secondary | ICD-10-CM | POA: Diagnosis not present

## 2017-02-03 DIAGNOSIS — F418 Other specified anxiety disorders: Secondary | ICD-10-CM | POA: Diagnosis not present

## 2017-02-03 DIAGNOSIS — Z0289 Encounter for other administrative examinations: Secondary | ICD-10-CM | POA: Diagnosis not present

## 2017-02-03 DIAGNOSIS — Z5181 Encounter for therapeutic drug level monitoring: Secondary | ICD-10-CM | POA: Diagnosis not present

## 2017-02-06 DIAGNOSIS — L905 Scar conditions and fibrosis of skin: Secondary | ICD-10-CM | POA: Diagnosis not present

## 2017-02-06 DIAGNOSIS — C44629 Squamous cell carcinoma of skin of left upper limb, including shoulder: Secondary | ICD-10-CM | POA: Diagnosis not present

## 2017-03-13 DIAGNOSIS — Z87891 Personal history of nicotine dependence: Secondary | ICD-10-CM | POA: Diagnosis not present

## 2017-03-13 DIAGNOSIS — F419 Anxiety disorder, unspecified: Secondary | ICD-10-CM | POA: Diagnosis not present

## 2017-03-13 DIAGNOSIS — E119 Type 2 diabetes mellitus without complications: Secondary | ICD-10-CM | POA: Diagnosis not present

## 2017-03-13 DIAGNOSIS — F329 Major depressive disorder, single episode, unspecified: Secondary | ICD-10-CM | POA: Diagnosis not present

## 2017-03-13 DIAGNOSIS — Z6820 Body mass index (BMI) 20.0-20.9, adult: Secondary | ICD-10-CM | POA: Diagnosis not present

## 2017-03-13 DIAGNOSIS — R062 Wheezing: Secondary | ICD-10-CM | POA: Diagnosis not present

## 2017-03-13 DIAGNOSIS — R5383 Other fatigue: Secondary | ICD-10-CM | POA: Diagnosis not present

## 2017-03-13 DIAGNOSIS — F17200 Nicotine dependence, unspecified, uncomplicated: Secondary | ICD-10-CM | POA: Diagnosis not present

## 2017-03-21 DIAGNOSIS — Z87891 Personal history of nicotine dependence: Secondary | ICD-10-CM | POA: Diagnosis not present

## 2017-03-21 DIAGNOSIS — N261 Atrophy of kidney (terminal): Secondary | ICD-10-CM | POA: Diagnosis not present

## 2017-03-21 DIAGNOSIS — Z122 Encounter for screening for malignant neoplasm of respiratory organs: Secondary | ICD-10-CM | POA: Diagnosis not present

## 2017-03-21 DIAGNOSIS — R5383 Other fatigue: Secondary | ICD-10-CM | POA: Diagnosis not present

## 2017-03-21 DIAGNOSIS — E279 Disorder of adrenal gland, unspecified: Secondary | ICD-10-CM | POA: Diagnosis not present

## 2017-03-25 ENCOUNTER — Other Ambulatory Visit: Payer: Self-pay | Admitting: Family Medicine

## 2017-03-25 DIAGNOSIS — Z7189 Other specified counseling: Secondary | ICD-10-CM

## 2017-03-27 ENCOUNTER — Ambulatory Visit: Payer: Medicare Other

## 2017-04-26 DIAGNOSIS — F329 Major depressive disorder, single episode, unspecified: Secondary | ICD-10-CM | POA: Diagnosis not present

## 2017-04-26 DIAGNOSIS — F419 Anxiety disorder, unspecified: Secondary | ICD-10-CM | POA: Diagnosis not present

## 2017-04-26 DIAGNOSIS — E119 Type 2 diabetes mellitus without complications: Secondary | ICD-10-CM | POA: Diagnosis not present

## 2017-04-26 DIAGNOSIS — I10 Essential (primary) hypertension: Secondary | ICD-10-CM | POA: Diagnosis not present

## 2017-04-26 DIAGNOSIS — E785 Hyperlipidemia, unspecified: Secondary | ICD-10-CM | POA: Diagnosis not present

## 2017-05-31 ENCOUNTER — Emergency Department: Payer: BLUE CROSS/BLUE SHIELD

## 2017-05-31 ENCOUNTER — Inpatient Hospital Stay
Admission: EM | Admit: 2017-05-31 | Discharge: 2017-06-02 | DRG: 918 | Disposition: A | Discharge status: Home / Self Care | Payer: BLUE CROSS/BLUE SHIELD | Attending: Internal Medicine | Admitting: Internal Medicine

## 2017-05-31 DIAGNOSIS — F332 Major depressive disorder, recurrent severe without psychotic features: Secondary | ICD-10-CM | POA: Diagnosis not present

## 2017-05-31 DIAGNOSIS — K219 Gastro-esophageal reflux disease without esophagitis: Secondary | ICD-10-CM | POA: Diagnosis present

## 2017-05-31 DIAGNOSIS — Z9842 Cataract extraction status, left eye: Secondary | ICD-10-CM

## 2017-05-31 DIAGNOSIS — R45851 Suicidal ideations: Secondary | ICD-10-CM | POA: Diagnosis present

## 2017-05-31 DIAGNOSIS — Z8673 Personal history of transient ischemic attack (TIA), and cerebral infarction without residual deficits: Secondary | ICD-10-CM

## 2017-05-31 DIAGNOSIS — Z9841 Cataract extraction status, right eye: Secondary | ICD-10-CM | POA: Diagnosis not present

## 2017-05-31 DIAGNOSIS — I10 Essential (primary) hypertension: Secondary | ICD-10-CM | POA: Diagnosis present

## 2017-05-31 DIAGNOSIS — Y905 Blood alcohol level of 100-119 mg/100 ml: Secondary | ICD-10-CM | POA: Diagnosis present

## 2017-05-31 DIAGNOSIS — F419 Anxiety disorder, unspecified: Secondary | ICD-10-CM | POA: Diagnosis present

## 2017-05-31 DIAGNOSIS — F329 Major depressive disorder, single episode, unspecified: Secondary | ICD-10-CM

## 2017-05-31 DIAGNOSIS — E785 Hyperlipidemia, unspecified: Secondary | ICD-10-CM | POA: Diagnosis present

## 2017-05-31 DIAGNOSIS — Z046 Encounter for general psychiatric examination, requested by authority: Secondary | ICD-10-CM

## 2017-05-31 DIAGNOSIS — F10129 Alcohol abuse with intoxication, unspecified: Secondary | ICD-10-CM | POA: Diagnosis present

## 2017-05-31 DIAGNOSIS — Z7902 Long term (current) use of antithrombotics/antiplatelets: Secondary | ICD-10-CM

## 2017-05-31 DIAGNOSIS — F1010 Alcohol abuse, uncomplicated: Secondary | ICD-10-CM

## 2017-05-31 DIAGNOSIS — E119 Type 2 diabetes mellitus without complications: Secondary | ICD-10-CM | POA: Diagnosis present

## 2017-05-31 DIAGNOSIS — F17290 Nicotine dependence, other tobacco product, uncomplicated: Secondary | ICD-10-CM | POA: Diagnosis present

## 2017-05-31 DIAGNOSIS — Z7984 Long term (current) use of oral hypoglycemic drugs: Secondary | ICD-10-CM | POA: Diagnosis not present

## 2017-05-31 DIAGNOSIS — Z79899 Other long term (current) drug therapy: Secondary | ICD-10-CM

## 2017-05-31 DIAGNOSIS — T391X1A Poisoning by 4-Aminophenol derivatives, accidental (unintentional), initial encounter: Principal | ICD-10-CM | POA: Diagnosis present

## 2017-05-31 LAB — CBC WITH DIFFERENTIAL/PLATELET
BASOS ABS: 0 10*3/uL (ref 0–0.1)
Basophils Relative: 1 %
Eosinophils Absolute: 0 10*3/uL (ref 0–0.7)
Eosinophils Relative: 0 %
HEMATOCRIT: 49 % (ref 40.0–52.0)
HEMOGLOBIN: 16.6 g/dL (ref 13.0–18.0)
LYMPHS PCT: 24 %
Lymphs Abs: 1.8 10*3/uL (ref 1.0–3.6)
MCH: 31.8 pg (ref 26.0–34.0)
MCHC: 33.9 g/dL (ref 32.0–36.0)
MCV: 93.7 fL (ref 80.0–100.0)
Monocytes Absolute: 0.5 10*3/uL (ref 0.2–1.0)
Monocytes Relative: 7 %
NEUTROS ABS: 5.2 10*3/uL (ref 1.4–6.5)
Neutrophils Relative %: 68 %
Platelets: 217 10*3/uL (ref 150–440)
RBC: 5.24 MIL/uL (ref 4.40–5.90)
RDW: 14.3 % (ref 11.5–14.5)
WBC: 7.6 10*3/uL (ref 3.8–10.6)

## 2017-05-31 LAB — SALICYLATE LEVEL: Salicylate Lvl: <7 mg/dL (ref 2.8–30.0)

## 2017-05-31 LAB — COMPREHENSIVE METABOLIC PANEL
ALBUMIN: 4.1 g/dL (ref 3.5–5.0)
ALK PHOS: 60 U/L (ref 38–126)
ALT: 10 U/L — ABNORMAL LOW (ref 17–63)
ANION GAP: 11 (ref 5–15)
AST: 21 U/L (ref 15–41)
BUN: 8 mg/dL (ref 6–20)
CO2: 24 mmol/L (ref 22–32)
Calcium: 8.4 mg/dL — ABNORMAL LOW (ref 8.9–10.3)
Chloride: 102 mmol/L (ref 101–111)
Creatinine, Ser: 0.96 mg/dL (ref 0.61–1.24)
GFR calc Af Amer: >60 mL/min (ref >60)
GFR calc non Af Amer: >60 mL/min (ref >60)
GLUCOSE: 178 mg/dL — AB (ref 65–99)
POTASSIUM: 4 mmol/L (ref 3.5–5.1)
SODIUM: 137 mmol/L (ref 135–145)
Total Bilirubin: 0.8 mg/dL (ref 0.3–1.2)
Total Protein: 7 g/dL (ref 6.5–8.1)

## 2017-05-31 LAB — ACETAMINOPHEN LEVEL: Acetaminophen (Tylenol), Serum: 78 ug/mL — ABNORMAL HIGH (ref 10–30)

## 2017-05-31 LAB — ETHANOL: Alcohol, Ethyl (B): 102 mg/dL — ABNORMAL HIGH (ref <5)

## 2017-05-31 MED ORDER — ACETYLCYSTEINE 20 % IN SOLN
70.0000 mg/kg | RESPIRATORY_TRACT | Status: DC
  Administered 2017-06-01 (×3): 5080 mg via ORAL
  Filled 2017-05-31 (×5): qty 30

## 2017-05-31 MED ORDER — INSULIN ASPART 100 UNIT/ML ~~LOC~~ SOLN: 0.0000 [IU] | Freq: Every day | SUBCUTANEOUS | Status: DC

## 2017-05-31 MED ORDER — INSULIN ASPART 100 UNIT/ML ~~LOC~~ SOLN: 0.0000 [IU] | Freq: Three times a day (TID) | SUBCUTANEOUS | Status: DC

## 2017-05-31 MED ORDER — SODIUM CHLORIDE 0.9% FLUSH
3.0000 mL | Freq: Two times a day (BID) | INTRAVENOUS | Status: DC
  Administered 2017-06-01 – 2017-06-02 (×3): 3 mL via INTRAVENOUS

## 2017-05-31 MED ORDER — ENOXAPARIN SODIUM 40 MG/0.4ML ~~LOC~~ SOLN
40.0000 mg | SUBCUTANEOUS | Status: DC
  Administered 2017-06-01 (×3): 40 mg via SUBCUTANEOUS
  Filled 2017-05-31 (×2): qty 0.4

## 2017-05-31 MED ORDER — ONDANSETRON HCL 4 MG PO TABS: 4.0000 mg | ORAL_TABLET | Freq: Four times a day (QID) | ORAL | Status: DC | PRN

## 2017-05-31 MED ORDER — ACETYLCYSTEINE 20 % IN SOLN
140.0000 mg/kg | Freq: Once | RESPIRATORY_TRACT | Status: AC
  Administered 2017-05-31: 10160 mg via ORAL
  Filled 2017-05-31: qty 60

## 2017-05-31 MED ORDER — BISACODYL 5 MG PO TBEC
5.0000 mg | DELAYED_RELEASE_TABLET | Freq: Every day | ORAL | Status: DC | PRN
  Filled 2017-05-31: qty 1

## 2017-05-31 MED ORDER — IPRATROPIUM BROMIDE 0.02 % IN SOLN: 0.5000 mg | Freq: Four times a day (QID) | RESPIRATORY_TRACT | Status: DC | PRN

## 2017-05-31 MED ORDER — ALBUTEROL SULFATE (2.5 MG/3ML) 0.083% IN NEBU: 2.5000 mg | INHALATION_SOLUTION | Freq: Four times a day (QID) | RESPIRATORY_TRACT | Status: DC | PRN

## 2017-05-31 MED ORDER — SODIUM CHLORIDE 0.9 % IV BOLUS (SEPSIS)
1000.0000 mL | Freq: Once | INTRAVENOUS | Status: AC
  Administered 2017-05-31: 1000 mL via INTRAVENOUS

## 2017-05-31 MED ORDER — SENNOSIDES-DOCUSATE SODIUM 8.6-50 MG PO TABS: 1.0000 | ORAL_TABLET | Freq: Every evening | ORAL | Status: DC | PRN

## 2017-05-31 MED ORDER — MAGNESIUM CITRATE PO SOLN: 1.0000 | Freq: Once | ORAL | Status: DC | PRN

## 2017-05-31 MED ORDER — OXYCODONE HCL 5 MG PO TABS: 5.0000 mg | ORAL_TABLET | ORAL | Status: DC | PRN

## 2017-05-31 MED ORDER — ONDANSETRON HCL 4 MG/2ML IJ SOLN
4.0000 mg | Freq: Four times a day (QID) | INTRAMUSCULAR | Status: DC | PRN
  Administered 2017-06-01: 4 mg via INTRAVENOUS
  Filled 2017-05-31: qty 2

## 2017-05-31 MED ORDER — SODIUM CHLORIDE 0.9 % IV SOLN
INTRAVENOUS | Status: DC
  Administered 2017-06-01: via INTRAVENOUS

## 2017-05-31 NOTE — BH Assessment (Signed)
Assessment Note  Kenneth Franklin is an 71 y.o. male. Mr. Kenneth Franklin reports that his wife passed away last februay and he has not gotten over it.  He states that he is not a drinker, but today he bought some red wine and he drank too much of it.  He states that he did the same thing last year time.  He states that he is still grieving her.  "She was my life".  He reports that he works daily and this has been slowly "dragging me down". He reports that he takes medication to sleep.  He reports that he has not eaten in 3-4 days.  He reports stomach discomforts.  He states that has anxiety and is being treated by his primary care (Dr. Sherryle Franklin). He states "I am not used to being by myself". He denied having auditory or visual hallucinations. He reports that he has suicidal thoughts. "I think about it all the time".  He denied having a plan to harm himself. He denied homicidal ideation or intent.  Patient was easily confused during the assessment and repeated multiple times that he was unsure of what had happened previously today.    Diagnosis: Depression, SI  Past Medical History:  Past Medical History:  Diagnosis Date  . Anxiety   . Depression   . Diabetes mellitus without complication ([place])   . Hyperlipidemia   . Hypertension     Past Surgical History:  Procedure Laterality Date  . CATARACT EXTRACTION Bilateral     Family History:  Family History  Problem Relation Age of Onset  . Diabetes Brother     Social History:  reports that he has been smoking Cigars.  He has never used smokeless tobacco. He reports that he drinks alcohol. He reports that he does not use drugs.  Additional Social History:  Alcohol / Drug Use History of alcohol / drug use?: No history of alcohol / drug abuse (rarely drinks)  CIWA: CIWA-Ar BP: 120/70 Pulse Rate: 82 COWS:    Allergies:  Allergies  Allergen Reactions  . Propoxyphene     Home Medications:  (Not in a hospital admission)  OB/GYN Status:  No LMP  for male patient.  General Assessment Data Location of Assessment: [facility] ED TTS Assessment: In system Is this a Tele or Face-to-Face Assessment?: Face-to-Face Is this an Initial Assessment or a Re-assessment for this encounter?: Initial Assessment Marital status: Widowed [place] name: n/a Is patient pregnant?: No Pregnancy Status: No Living Arrangements: Alone Can pt return to current living arrangement?: Yes Admission Status: Voluntary Is patient capable of signing voluntary admission?: Yes Referral Source: Self/Family/Friend Insurance type: BCBS, Medicare A&B  Medical Screening Exam ([place]) Medical Exam completed: Yes  Crisis Care Plan Living Arrangements: Alone Legal Guardian: Other: (Self) Name of Psychiatrist: None Name of Therapist: None  Education Status Is patient currently in school?: No Current Grade: n/a Highest grade of school patient has completed: 12th Name of school: [employer] person: n/a  Risk to self with the past 6 months Suicidal Ideation: Yes-Currently Present Has patient been a risk to self within the past 6 months prior to admission? : No Suicidal Intent: No Has patient had any suicidal intent within the past 6 months prior to admission? : No Is patient at risk for suicide?: No Suicidal Plan?: No Has patient had any suicidal plan within the past 6 months prior to admission? : No Access to Means: No What has been your use of drugs/alcohol within the  last 12 months?: Occassional use of alcohol Previous Attempts/Gestures: No How many times?: 0 Other Self Harm Risks: denied Triggers for Past Attempts: None known Intentional Self Injurious Behavior: None Family Suicide History: No Recent stressful life event(s): Other (Comment) (Death of wife) Persecutory voices/beliefs?: No Depression: Yes Depression Symptoms: Despondent, Tearfulness, Feeling worthless/self pity Substance abuse history and/or treatment for substance  abuse?: No Suicide prevention information given to non-admitted patients: Not applicable  Risk to Others within the past 6 months Homicidal Ideation: No Does patient have any lifetime risk of violence toward others beyond the six months prior to admission? : No Thoughts of Harm to Others: No Current Homicidal Intent: No Current Homicidal Plan: No Access to Homicidal Means: No Identified Victim: None identified History of harm to others?: No Assessment of Violence: None Noted Does patient have access to weapons?: No Criminal Charges Pending?: No Does patient have a court date: No Is patient on probation?: No  Psychosis Hallucinations: None noted Delusions: None noted  Mental Status Report Appearance/Hygiene: Unremarkable Eye Contact: Fair Motor Activity: Unremarkable Speech: Loud Level of Consciousness: Alert Mood: Depressed Affect: Sullen Anxiety Level: Minimal Thought Processes: Coherent Judgement: Unimpaired Orientation: Person, Place, Time, Situation Obsessive Compulsive Thoughts/Behaviors: None  Cognitive Functioning Concentration: Decreased (easily confused) Memory: Recent Intact IQ: Average Insight: Fair Impulse Control: Good Appetite: Poor Sleep: No Change (uses sleep medication) Vegetative Symptoms: None  ADLScreening [facility] Assessment Services) Patient's cognitive ability adequate to safely complete daily activities?: Yes Patient able to express need for assistance with ADLs?: Yes Independently performs ADLs?: Yes (appropriate for developmental age)  Prior Inpatient Therapy Prior Inpatient Therapy: No Prior Therapy Dates: n/a Prior Therapy Facilty/Provider(s): n/a Reason for Treatment: n/a  Prior Outpatient Therapy Prior Outpatient Therapy: No Prior Therapy Dates: n/a Prior Therapy Facilty/Provider(s): n/a Reason for Treatment: n/a Does patient have an ACCT team?: No Does patient have Intensive In-House Services?  : No Does patient have [facility]  services? : No Does patient have P4CC services?: No  ADL Screening (condition at time of admission) Patient's cognitive ability adequate to safely complete daily activities?: Yes Is the patient deaf or have difficulty hearing?: No Does the patient have difficulty seeing, even when wearing glasses/contacts?: Yes (Has had cataract surgery) Does the patient have difficulty concentrating, remembering, or making decisions?: No Patient able to express need for assistance with ADLs?: Yes Does the patient have difficulty dressing or bathing?: No Independently performs ADLs?: Yes (appropriate for developmental age) Does the patient have difficulty walking or climbing stairs?: No Weakness of Legs: None (left foot stays numb after stroke last year) Weakness of Arms/Hands: None  Home Assistive Devices/Equipment Home Assistive Devices/Equipment: None    Abuse/Neglect Assessment (Assessment to be complete while patient is alone) Physical Abuse: Denies Verbal Abuse: Denies Sexual Abuse: Denies Exploitation of patient/patient's resources: Denies Self-Neglect: Denies     Regulatory affairs officer (For Healthcare) Does Patient Have a Medical Advance Directive?: No    Additional Information 1:1 In Past 12 Months?: No CIRT Risk: No Elopement Risk: No Does patient have medical clearance?: Yes     Disposition:  Disposition Initial Assessment Completed for this Encounter: Yes Disposition of Patient: Other dispositions, Inpatient treatment program  On Site Evaluation by:   Reviewed with Physician:    Elmer Bales 05/31/2017 8:46 PM

## 2017-05-31 NOTE — ED Notes (Signed)
Pharmacy emailed to send acetylcysteine

## 2017-05-31 NOTE — ED Triage Notes (Signed)
Pt arrived via ems for pt reports he fell last night and his friend thought he needed to get some help because he has not eaten in 3 days - he has drank red wine mostly for three days - he denies any pain or injury from the fall - he reports he is suicidal and has stopped eating and taking all his medication and does not care about living - Dr Jacqualine Code notified

## 2017-05-31 NOTE — ED Notes (Signed)
Pt unable to void at this time. 

## 2017-05-31 NOTE — H&P (Signed)
History and Physical   [facility] - [place] @ [facility] Admission History and Physical [employer], D.O.    Patient Name: Kenneth Franklin MR#: [id number] Date of Birth: 22-Feb-1946 Date of Admission: 05/31/2017  Referring MD/NP/PA: Dr. Jacqualine Code  Primary Care Physician: Roselee Nova, MD Patient coming from: Home  Chief Complaint:  Chief Complaint  Patient presents with  . Depression  . Fall  . Suicidal    HPI: Kenneth Franklin is a 71 y.o. male with a known history of CVA, depression, DMGERD, hypertension presents to the emergency department for evaluation of depression/SI.  Patient states that he has become increasingly depressed over the death of his wife about one and a half years ago. He has been drinking alcohol more frequently in the past 3-4 days, has decreased appetite and by mouth intake and is tearful most days. He admits to frequent thoughts of suicide but has no plan. He states that he did not take any medication with the intent of suicide  Patient denies fevers/chills, weakness, dizziness, chest pain, shortness of breath, N/V/C/D, abdominal pain, dysuria/frequency, changes in mental status, homicidal ideation   Otherwise there has been no change in status. Patient has been taking medication as prescribed and there has been no recent change in medication or diet.  No recent antibiotics.  There has been no recent illness, hospitalizations, travel or sick contacts.    EMS/ED Course: Patient was found to have an elevated acetaminophen level of 78 for which he received NAC, NS. Patient is adamant that he has not been taking any Tylenol or any products that contain Tylenol. Review of his prescribed medications reveal no evidence of any acetaminophen. Medical admission was requested for ongoing management of presumed acetaminophen overdose, depression, alcohol abuse.  Review of Systems:  CONSTITUTIONAL: No fever/chills, fatigue, weakness, weight gain/loss, headache. EYES:  No blurry or double vision. ENT: No tinnitus, postnasal drip, redness or soreness of the oropharynx. RESPIRATORY: No cough, dyspnea, wheeze.  No hemoptysis.  CARDIOVASCULAR: No chest pain, palpitations, syncope, orthopnea. No lower extremity edema.  GASTROINTESTINAL: No nausea, vomiting, abdominal pain, diarrhea, constipation.  No hematemesis, melena or hematochezia. GENITOURINARY: No dysuria, frequency, hematuria. ENDOCRINE: No polyuria or nocturia. No heat or cold intolerance. HEMATOLOGY: No anemia, bruising, bleeding. INTEGUMENTARY: No rashes, ulcers, lesions. MUSCULOSKELETAL: No arthritis, gout, dyspnea. NEUROLOGIC: No numbness, tingling, ataxia, seizure-type activity, weakness. PSYCHIATRIC:Positive depression, suicidal ideation.   Past Medical History:  Diagnosis Date  . Anxiety   . Depression   . Diabetes mellitus without complication ([place])   . Hyperlipidemia   . Hypertension   Active Problems Reconcile with Patient's Chart  Problem Noted Date  CVA (cerebral vascular accident) (CMS-HCC) 06/28/2016  Tobacco use disorder 06/28/2016  Grief counseling 06/16/2016  Acid reflux 04/21/2015  Anxiety and depression 04/21/2015  BP (high blood pressure) 04/21/2015  Diabetes mellitus type 2, controlled, without complications (CMS-HCC) 97/98/9211  Dyslipidemia 04/21/2015  HLD (hyperlipidemia)      Past Surgical History:  Procedure Laterality Date  . CATARACT EXTRACTION Bilateral      reports that he has been smoking Cigars.  He has never used smokeless tobacco. He reports that he drinks alcohol. He reports that he does not use drugs.  Allergies  Allergen Reactions  . Propoxyphene     Family History   Medical History Relation Name Comments  Diabetes Brother    Cancer Father    Heart disease Mother       Prior to Admission medications   Medication Sig Start  Date End Date Taking? Authorizing Provider  ALPRAZolam Duanne Moron) 0.25 MG tablet TAKE 1 TABLET EVERY DAY AS  NEEDED FOR ANXIETY 10/26/16   Roselee Nova, MD  atorvastatin (LIPITOR) 80 MG tablet TAKE 1 TABLET (80 MG TOTAL) BY MOUTH NIGHTLY. 11/14/16   Roselee Nova, MD  BLOOD GLUCOSE MONITORING SUPPL ONETOUCH ULTRA (Device) - Historical Medication Active    [provider]  busPIRone (BUSPAR) 10 MG tablet TAKE 1 TABLET (10 MG TOTAL) BY MOUTH 2 (TWO) TIMES DAILY. 08/16/16   Roselee Nova, MD  Cholecalciferol 1000 UNITS capsule Take by mouth.    [provider]  clopidogrel (PLAVIX) 75 MG tablet TAKE 1 TABLET (75 MG TOTAL) BY MOUTH DAILY. 07/25/16   Roselee Nova, MD  escitalopram (LEXAPRO) 20 MG tablet TAKE 1 TABLET (20 MG TOTAL) BY MOUTH DAILY. 08/16/16   Roselee Nova, MD  losartan (COZAAR) 100 MG tablet TAKE 1 TABLET (100 MG TOTAL) BY MOUTH DAILY. 08/16/16   Roselee Nova, MD  metFORMIN (GLUCOPHAGE) 500 MG tablet Take 1 tablet (500 mg total) by mouth daily with breakfast. 05/18/16   Roselee Nova, MD  ranitidine (ZANTAC) 300 MG tablet TAKE 1 TABLET (300 MG TOTAL) BY MOUTH AT BEDTIME. 08/16/16   Roselee Nova, MD  sertraline (ZOLOFT) 25 MG tablet TAKE 1 TABLET (25 MG TOTAL) BY MOUTH AT BEDTIME. 12/19/16   Roselee Nova, MD    Physical Exam: Vitals:   05/31/17 1822 05/31/17 1828  BP:  120/70  Pulse:  82  Resp:  16  Temp:  98 F (36.7 C)  TempSrc:  Oral  SpO2:  98%  Weight: 72.6 kg (160 lb)   Height: 6\' 1"  (1.854 m)     GENERAL: 71 y.o.-year-old Tearful white male male patient, well-developed, well-nourished lying in the bed. Pleasant and cooperative.   HEENT: Head atraumatic, normocephalic. Pupils equal, round, reactive to light and accommodation. No scleral icterus. Extraocular muscles intact. Nares are patent. Oropharynx is clear. Mucus membranes dry. NECK: Supple, full range of motion. No JVD, no bruit heard. No thyroid enlargement, no tenderness, no cervical lymphadenopathy. CHEST: Normal breath sounds bilaterally. No wheezing, rales, rhonchi  or crackles. No use of accessory muscles of respiration.  No reproducible chest wall tenderness.  CARDIOVASCULAR: S1, S2 normal. No murmurs, rubs, or gallops. Cap refill <2 seconds. Pulses intact distally.  ABDOMEN: Soft, nondistended, nontender. No rebound, guarding, rigidity. Normoactive bowel sounds present in all four quadrants. No organomegaly or mass. EXTREMITIES: No pedal edema, cyanosis, or clubbing. No calf tenderness or Homan's sign.  NEUROLOGIC: The patient is alert and oriented x 3. Cranial nerves II through XII are grossly intact with no focal sensorimotor deficit. Muscle strength 5/5 in all extremities. Sensation intact. Gait not checked. PSYCHIATRIC:  Depressed mood. Normal affect SKIN: Warm, dry, and intact without obvious rash, lesion, or ulcer.    Labs on Admission:  CBC:  Recent Labs Lab 05/31/17 1836  WBC 7.6  NEUTROABS 5.2  HGB 16.6  HCT 49.0  MCV 93.7  PLT 916   Basic Metabolic Panel:  Recent Labs Lab 05/31/17 1836  NA 137  K 4.0  CL 102  CO2 24  GLUCOSE 178*  BUN 8  CREATININE 0.96  CALCIUM 8.4*   GFR: Estimated Creatinine Clearance: 74.6 mL/min (by C-G formula based on SCr of 0.96 mg/dL). Liver Function Tests:  Recent Labs Lab 05/31/17 1836  AST 21  ALT 10*  ALKPHOS 60  BILITOT 0.8  PROT 7.0  ALBUMIN 4.1   No results for input(s): LIPASE, AMYLASE in the last 168 hours. No results for input(s): AMMONIA in the last 168 hours. Coagulation Profile: No results for input(s): INR, PROTIME in the last 168 hours. Cardiac Enzymes: No results for input(s): CKTOTAL, CKMB, CKMBINDEX, TROPONINI in the last 168 hours. BNP (last 3 results) No results for input(s): PROBNP in the last 8760 hours. HbA1C: No results for input(s): HGBA1C in the last 72 hours. CBG: No results for input(s): GLUCAP in the last 168 hours. Lipid Profile: No results for input(s): CHOL, HDL, LDLCALC, TRIG, CHOLHDL, LDLDIRECT in the last 72 hours. Thyroid Function  Tests: No results for input(s): TSH, T4TOTAL, FREET4, T3FREE, THYROIDAB in the last 72 hours. Anemia Panel: No results for input(s): VITAMINB12, FOLATE, FERRITIN, TIBC, IRON, RETICCTPCT in the last 72 hours. Urine analysis:    Component Value Date/Time   COLORURINE YELLOW (A) 06/26/2016 1529   APPEARANCEUR CLEAR (A) 06/26/2016 1529   LABSPEC 1.006 06/26/2016 1529   PHURINE 6.0 06/26/2016 1529   GLUCOSEU 50 (A) 06/26/2016 1529   HGBUR NEGATIVE 06/26/2016 1529   BILIRUBINUR NEGATIVE 06/26/2016 1529   KETONESUR NEGATIVE 06/26/2016 1529   PROTEINUR NEGATIVE 06/26/2016 1529   NITRITE NEGATIVE 06/26/2016 1529   LEUKOCYTESUR NEGATIVE 06/26/2016 1529   Sepsis Labs: @LABRCNTIP (procalcitonin:4,lacticidven:4) )No results found for this or any previous visit (from the past 240 hour(s)).   Radiological Exams on Admission: Ct Head Wo Contrast  Result Date: 05/31/2017 CLINICAL DATA:  Pt arrived via EMS for pt reports he fell last night and his friend thought he needed to get some help because he has not eaten in 3 days - he has drank red wine mostly for three days - he denies any pain or injury from the fall, hx of stroke EXAM: CT HEAD WITHOUT CONTRAST TECHNIQUE: Contiguous axial images were obtained from the base of the skull through the vertex without intravenous contrast. COMPARISON:  None. FINDINGS: Brain: There is central and cortical atrophy. Periventricular white matter changes are consistent with small vessel disease. There is no intra or extra-axial fluid collection or mass lesion. The basilar cisterns and ventricles have a normal appearance. There is no CT evidence for acute infarction or hemorrhage. Vascular: There is atherosclerotic calcification of the internal carotid arteries. Skull: Normal. Negative for fracture or focal lesion. Sinuses/Orbits: No acute finding. Other: None IMPRESSION: 1.  No evidence for acute intracranial abnormality. 2. Atrophy and small vessel disease. Electronically  Signed   By: Nolon Nations M.D.   On: 05/31/2017 18:56    EKG: Sinus tach at 100 bpm with normal axis and nonspecific ST-T wave changes.   Assessment/Plan  This is a 71 y.o. male with a history of CVA, depression, diabetes, GERD, hypertension now being admitted with:  #. Acetaminophen overdose -Admit to telemetry -Continue oral NAC -Poison control following  #. Alcohol intoxication -Monitor for signs of alcohol withdrawal. -Check alcohol level  #. Suicidal ideation/depression -1-1 sitter -Involuntary commitment -Psych consult -Need to clarify and reconcile home medications as he is on multiple antidepressants and benzos  #. History of CVA - Continue Plavix  #. History of hypertension - Continue Cozaar  #. History of GERD - Continue Zantac  #. History of H/o Diabetes - Accuchecks achs with RISS coverage - Heart healthy, carb controlled diet -Hold metformin  Admission status: Inpatient, telemetry IV Fluids: Normal saline Diet/Nutrition: Heart healthy, carb controlled Consults called: Psychiatry, social work  DVT Px:  Lovenox, SCDs and early ambulation. Code Status: Full Code  Disposition Plan: To be determined  All the records are reviewed and case discussed with ED provider. Management plans discussed with the patient and/or family who express understanding and agree with plan of care.  Stellan Vick D.O. on 05/31/2017 at 8:51 PM Between 7am to 6pm - Pager - [phone] After 6pm go to [email or web address] - [employer] [place] [facility] Office [phone] CC: Primary care physician; Roselee Nova, MD   05/31/2017, 8:51 PM

## 2017-05-31 NOTE — Progress Notes (Signed)
MEDICATION RELATED CONSULT NOTE - INITIAL   Pharmacy Consult for N-acetylcysteine Indication: acetaminophen overdose  Allergies  Allergen Reactions  . Propoxyphene     Patient Measurements: Height: 6\' 1"  (185.4 cm) Weight: 160 lb (72.6 kg) IBW/kg (Calculated) : 79.9 Adjusted Body Weight:   Vital Signs: Temp: 98 F (36.7 C) (07/04 1828) Temp Source: Oral (07/04 1828) BP: 120/70 (07/04 1828) Pulse Rate: 82 (07/04 1828) Intake/Output from previous day: No intake/output data recorded. Intake/Output from this shift: No intake/output data recorded.  Labs:  Recent Labs  05/31/17 1836  WBC 7.6  HGB 16.6  HCT 49.0  PLT 217  CREATININE 0.96  ALBUMIN 4.1  PROT 7.0  AST 21  ALT 10*  ALKPHOS 60  BILITOT 0.8   Estimated Creatinine Clearance: 74.6 mL/min (by C-G formula based on SCr of 0.96 mg/dL).   Microbiology: No results found for this or any previous visit (from the past 720 hour(s)).  Medical History: Past Medical History:  Diagnosis Date  . Anxiety   . Depression   . Diabetes mellitus without complication ([place])   . Hyperlipidemia   . Hypertension     Medications:  Infusions:    Assessment: 71 yom with acetaminophen level 78 on admission. AST and ALT are not elevated. Pharmacy consulted for NAc oral dosing.  Goal of Therapy:  Normal to therapeutic APAP level  Plan:  NAc 140 mg/kg PO x 1 followed by NAc 70 mg/kg PO Q4H. Pharmacy will continue to follow.  Laural Benes, Pharm.D., BCPS Clinical Pharmacist 05/31/2017,8:07 PM

## 2017-05-31 NOTE — ED Provider Notes (Signed)
[facility] Emergency Department Provider Note ____________________________________________   First MD Initiated Contact with Patient 05/31/17 1819     (approximate)  I have reviewed the triage vital signs and the nursing notes.   HISTORY  Chief Complaint Depression; Fall; and Suicidal    HPI Kenneth Franklin is a 71 y.o. male history of previous stroke, depression, diabetes  Patient reports that he's been suffering from severe depression, he's been talking His friend and he is beginning to have thoughts of suicide. He has not eaten or drank anything other than been drinking wine for the last 3 days. Reports he begins ruminating on the death of his wife a year ago, and he feels very depressed and hopeless.  Denies any other symptoms. No headaches. No nausea or vomiting. No recent fevers or chills. 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 Has not taken his medicine for 3 days denies any abdominal pain or anything else other than feeling very depressed as well as not wanting eat or drink anything  Reports he is not a daily drinker, but he began drinking quite a bit 3 days ago to numb the pain of losing his wife  Past Medical History:  Diagnosis Date  . Anxiety   . Depression   . Diabetes mellitus without complication ([place])   . Hyperlipidemia   . Hypertension     Patient Active Problem List   Diagnosis Date Noted  . Erroneous encounter - disregard 08/02/2016  . Acute ischemic stroke ([place]) 07/07/2016  . Tick bite of flank 06/16/2016  . Grief counseling 06/16/2016  . Fatigue 06/16/2016  . Absent pulse in lower extremity 09/14/2015  . Anxiety and depression 04/21/2015  . Diabetes mellitus type 2, controlled, without complications ([place]) 97/53/0051  . Acid reflux 04/21/2015  . Calcium blood increased 04/21/2015  . Dyslipidemia 04/21/2015  . HLD (hyperlipidemia) 04/21/2015  . BP (high blood  pressure) 04/21/2015    Past Surgical History:  Procedure Laterality Date  . CATARACT EXTRACTION Bilateral     Prior to Admission medications   Medication Sig Start Date End Date Taking? Authorizing Provider  ALPRAZolam Duanne Moron) 0.25 MG tablet TAKE 1 TABLET EVERY DAY AS NEEDED FOR ANXIETY 10/26/16   Roselee Nova, MD  atorvastatin (LIPITOR) 80 MG tablet TAKE 1 TABLET (80 MG TOTAL) BY MOUTH NIGHTLY. 11/14/16   Roselee Nova, MD  BLOOD GLUCOSE MONITORING SUPPL ONETOUCH ULTRA (Device) - Historical Medication Active    [provider]  busPIRone (BUSPAR) 10 MG tablet TAKE 1 TABLET (10 MG TOTAL) BY MOUTH 2 (TWO) TIMES DAILY. 08/16/16   Roselee Nova, MD  Cholecalciferol 1000 UNITS capsule Take by mouth.    [provider]  clopidogrel (PLAVIX) 75 MG tablet TAKE 1 TABLET (75 MG TOTAL) BY MOUTH DAILY. 07/25/16   Roselee Nova, MD  escitalopram (LEXAPRO) 20 MG tablet TAKE 1 TABLET (20 MG TOTAL) BY MOUTH DAILY. 08/16/16   Roselee Nova, MD  losartan (COZAAR) 100 MG tablet TAKE 1 TABLET (100 MG TOTAL) BY MOUTH DAILY. 08/16/16   Roselee Nova, MD  metFORMIN (GLUCOPHAGE) 500 MG tablet Take 1 tablet (500 mg total) by mouth daily with breakfast. 05/18/16   Roselee Nova, MD  ranitidine (ZANTAC) 300 MG tablet TAKE 1 TABLET (300 MG TOTAL) BY MOUTH AT BEDTIME. 08/16/16   Roselee Nova, MD  sertraline (ZOLOFT) 25 MG tablet  TAKE 1 TABLET (25 MG TOTAL) BY MOUTH AT BEDTIME. 12/19/16   Roselee Nova, MD    Allergies Propoxyphene  Family History  Problem Relation Age of Onset  . Diabetes Brother     Social History Social History  Substance Use Topics  . Smoking status: Current Some Day Smoker    Types: Cigars  . Smokeless tobacco: Never Used  . Alcohol use Yes     Comment: daily wine - several a day    Review of Systems Constitutional: No fever/chills Eyes: No visual changes. ENT: No sore throat. Cardiovascular: Denies chest pain. Respiratory:  Denies shortness of breath. Gastrointestinal: No abdominal pain.  No nausea, no vomiting.  No diarrhea.  No constipation. Genitourinary: Negative for dysuria. Musculoskeletal: Negative for back pain. Skin: Negative for rash. Neurological: Negative for headaches, focal weakness or numbness.    ____________________________________________   PHYSICAL EXAM:  VITAL SIGNS: ED Triage Vitals  Enc Vitals Group     BP 05/31/17 1828 120/70     Pulse Rate 05/31/17 1828 82     Resp 05/31/17 1828 16     Temp 05/31/17 1828 98 F (36.7 C)     Temp Source 05/31/17 1828 Oral     SpO2 05/31/17 1828 98 %     Weight 05/31/17 1822 160 lb (72.6 kg)     Height 05/31/17 1822 6\' 1"  (1.854 m)     Head Circumference --      Peak Flow --      Pain Score 05/31/17 1822 0     Pain Loc --      Pain Edu? --      Excl. in [place]? --     Constitutional: Alert and oriented. Well appearing and in no acute distress. Eyes: Conjunctivae are normal. Head: Atraumatic. Nose: No congestion/rhinnorhea. Mouth/Throat: Mucous membranes are moist. Neck: No stridor.   Cardiovascular: Normal rate, regular rhythm. Grossly normal heart sounds.  Good peripheral circulation. Respiratory: Normal respiratory effort.  No retractions. Lungs CTAB. Gastrointestinal: Soft and nontender. No distention. Musculoskeletal: No lower extremity tenderness nor edema. Neurologic:  Normal speech and language. No gross focal neurologic deficits are appreciated.  Skin:  Skin is warm, dry and intact. No rash noted. Psychiatric: Mood and affect are Depressed. Speech and behavior are normal. Reports active suicidal ideation  ____________________________________________   LABS (all labs ordered are listed, but only abnormal results are displayed)  Labs Reviewed  COMPREHENSIVE METABOLIC PANEL - Abnormal; Notable for the following:       Result Value   Glucose, Bld 178 (*)    Calcium 8.4 (*)    ALT 10 (*)    All other components within normal  limits  ACETAMINOPHEN LEVEL - Abnormal; Notable for the following:    Acetaminophen (Tylenol), Serum 78 (*)    All other components within normal limits  ETHANOL - Abnormal; Notable for the following:    Alcohol, Ethyl (B) 102 (*)    All other components within normal limits  SALICYLATE LEVEL  CBC WITH DIFFERENTIAL/PLATELET  URINE DRUG SCREEN, QUALITATIVE ([facility] ONLY)  URINALYSIS, COMPLETE (UACMP) WITH MICROSCOPIC  CBG MONITORING, ED   ____________________________________________  EKG  Reviewed and interpreted by me at 1945 Sinus tachycardia, heart rate 100 QRS 85 QTC 440 Sinus tachycardia, ischemic changes noted, mild artifact ____________________________________________  RADIOLOGY  Ct Head Wo Contrast  Result Date: 05/31/2017 CLINICAL DATA:  Pt arrived via EMS for pt reports he fell last night and his friend thought he needed to  get some help because he has not eaten in 3 days - he has drank red wine mostly for three days - he denies any pain or injury from the fall, hx of stroke EXAM: CT HEAD WITHOUT CONTRAST TECHNIQUE: Contiguous axial images were obtained from the base of the skull through the vertex without intravenous contrast. COMPARISON:  None. FINDINGS: Brain: There is central and cortical atrophy. Periventricular white matter changes are consistent with small vessel disease. There is no intra or extra-axial fluid collection or mass lesion. The basilar cisterns and ventricles have a normal appearance. There is no CT evidence for acute infarction or hemorrhage. Vascular: There is atherosclerotic calcification of the internal carotid arteries. Skull: Normal. Negative for fracture or focal lesion. Sinuses/Orbits: No acute finding. Other: None IMPRESSION: 1.  No evidence for acute intracranial abnormality. 2. Atrophy and small vessel disease. Electronically Signed   By: Nolon Nations M.D.   On: 05/31/2017 18:56     ____________________________________________   PROCEDURES  Procedure(s) performed: None  Procedures  Critical Care performed: No  ____________________________________________   INITIAL IMPRESSION / ASSESSMENT AND PLAN / ED COURSE  Pertinent labs & imaging results that were available during my care of the patient were reviewed by me and considered in my medical decision making (see chart for details).  Patient presents for concerns of suicidal ideation. Appears likely suffering from depression with concomitant abuse of alcohol at this time. Denies being a daily drinker, reports he drank quite a bit the last 3 days. Does not appear clinically intoxicated. Reassuring examination, based on his previous history however and stroke and age I will obtain a CT of the head, basic labs EKG as we screen him for medical etiologies of his symptoms today though his description sounds most consistent with psychiatric etiology likely driven by his depression  Placed under involuntary commitment  Clinical Course as of Jun 01 2003  Wed May 31, 2017  1956  [MQ]  2003 Patient denies taking any Tylenol containing substances, I question the validity of his answer these presenting with suicidal ideation. His medications that he brought with him do not have any Tylenol-containing compound in them, and to me this is concerning that he may have overdosed and is not providing an accurate history. Spoke with poison control and I'll start him on oral NAC therapy after I discussed with poison control.   [MQ]    Clinical Course User Index [MQ] Delman Kitten, MD  Spoke with [facility] Poison Control. ----------------------------------------- 7:54 PM on 05/31/2017 -----------------------------------------  Patient's acetaminophen level elevated notably.  ____________________________________________   FINAL CLINICAL IMPRESSION(S) / ED DIAGNOSES  Final diagnoses:  Suicidal ideation  Tylenol overdose, accidental  or unintentional, initial encounter  Involuntary commitment      NEW MEDICATIONS STARTED DURING THIS VISIT:  New Prescriptions   No medications on file     Note:  This document was prepared using Dragon voice recognition software and may include unintentional dictation errors.     Delman Kitten, MD 05/31/17 2008

## 2017-06-01 DIAGNOSIS — F1010 Alcohol abuse, uncomplicated: Secondary | ICD-10-CM

## 2017-06-01 DIAGNOSIS — F332 Major depressive disorder, recurrent severe without psychotic features: Secondary | ICD-10-CM

## 2017-06-01 LAB — CBC
HCT: 43.9 % (ref 40.0–52.0)
HEMOGLOBIN: 14.9 g/dL (ref 13.0–18.0)
MCH: 31.6 pg (ref 26.0–34.0)
MCHC: 33.9 g/dL (ref 32.0–36.0)
MCV: 93.2 fL (ref 80.0–100.0)
Platelets: 198 10*3/uL (ref 150–440)
RBC: 4.71 MIL/uL (ref 4.40–5.90)
RDW: 14.1 % (ref 11.5–14.5)
WBC: 7.9 10*3/uL (ref 3.8–10.6)

## 2017-06-01 LAB — URINALYSIS, COMPLETE (UACMP) WITH MICROSCOPIC
Bacteria, UA: NONE SEEN
Bilirubin Urine: NEGATIVE
Glucose, UA: 50 mg/dL — AB
Hgb urine dipstick: NEGATIVE
KETONES UR: 5 mg/dL — AB
Leukocytes, UA: NEGATIVE
Nitrite: NEGATIVE
PH: 5 (ref 5.0–8.0)
Protein, ur: NEGATIVE mg/dL
RBC / HPF: NONE SEEN RBC/hpf (ref 0–5)
SPECIFIC GRAVITY, URINE: 1.039 — AB (ref 1.005–1.030)
SQUAMOUS EPITHELIAL / LPF: NONE SEEN

## 2017-06-01 LAB — GLUCOSE, CAPILLARY
GLUCOSE-CAPILLARY: 102 mg/dL — AB (ref 65–99)
GLUCOSE-CAPILLARY: 132 mg/dL — AB (ref 65–99)
Glucose-Capillary: 116 mg/dL — ABNORMAL HIGH (ref 65–99)
Glucose-Capillary: 118 mg/dL — ABNORMAL HIGH (ref 65–99)
Glucose-Capillary: 102 mg/dL — ABNORMAL HIGH (ref 65–99)

## 2017-06-01 LAB — COMPREHENSIVE METABOLIC PANEL
ALBUMIN: 3.9 g/dL (ref 3.5–5.0)
ALT: 10 U/L — ABNORMAL LOW (ref 17–63)
ANION GAP: 8 (ref 5–15)
AST: 19 U/L (ref 15–41)
Alkaline Phosphatase: 53 U/L (ref 38–126)
BUN: 9 mg/dL (ref 6–20)
CO2: 29 mmol/L (ref 22–32)
Calcium: 8.2 mg/dL — ABNORMAL LOW (ref 8.9–10.3)
Chloride: 103 mmol/L (ref 101–111)
Creatinine, Ser: 0.94 mg/dL (ref 0.61–1.24)
GFR calc Af Amer: >60 mL/min (ref >60)
GFR calc non Af Amer: >60 mL/min (ref >60)
GLUCOSE: 131 mg/dL — AB (ref 65–99)
POTASSIUM: 4.2 mmol/L (ref 3.5–5.1)
SODIUM: 140 mmol/L (ref 135–145)
Total Bilirubin: 1.2 mg/dL (ref 0.3–1.2)
Total Protein: 6.8 g/dL (ref 6.5–8.1)

## 2017-06-01 LAB — URINE DRUG SCREEN, QUALITATIVE (ARMC ONLY)
Amphetamines, Ur Screen: NOT DETECTED
BARBITURATES, UR SCREEN: NOT DETECTED
BENZODIAZEPINE, UR SCRN: NOT DETECTED
CANNABINOID 50 NG, UR ~~LOC~~: NOT DETECTED
Cocaine Metabolite,Ur ~~LOC~~: NOT DETECTED
MDMA (Ecstasy)Ur Screen: NOT DETECTED
Methadone Scn, Ur: NOT DETECTED
OPIATE, UR SCREEN: NOT DETECTED
PHENCYCLIDINE (PCP) UR S: NOT DETECTED
Tricyclic, Ur Screen: NOT DETECTED

## 2017-06-01 LAB — ACETAMINOPHEN LEVEL: Acetaminophen (Tylenol), Serum: <10 ug/mL — ABNORMAL LOW (ref 10–30)

## 2017-06-01 LAB — PROTIME-INR
INR: 1.19
PROTHROMBIN TIME: 15.2 s (ref 11.4–15.2)

## 2017-06-01 MED ORDER — ATORVASTATIN CALCIUM 20 MG PO TABS
80.0000 mg | ORAL_TABLET | Freq: Every day | ORAL | Status: DC
  Administered 2017-06-01: 80 mg via ORAL
  Filled 2017-06-01: qty 4

## 2017-06-01 MED ORDER — ESCITALOPRAM OXALATE 10 MG PO TABS
20.0000 mg | ORAL_TABLET | Freq: Every day | ORAL | Status: DC
  Filled 2017-06-01: qty 1

## 2017-06-01 MED ORDER — ESCITALOPRAM OXALATE 20 MG PO TABS: 20.0000 mg | ORAL_TABLET | Freq: Every day | ORAL | Status: DC

## 2017-06-01 MED ORDER — MIRTAZAPINE 15 MG PO TABS
30.0000 mg | ORAL_TABLET | Freq: Every day | ORAL | Status: DC
  Administered 2017-06-01: 30 mg via ORAL
  Filled 2017-06-01: qty 2

## 2017-06-01 MED ORDER — ALUM & MAG HYDROXIDE-SIMETH 200-200-20 MG/5ML PO SUSP
30.0000 mL | ORAL | Status: DC | PRN
  Administered 2017-06-01 (×2): 30 mL via ORAL
  Filled 2017-06-01 (×2): qty 30

## 2017-06-01 MED ORDER — VITAMIN D 1000 UNITS PO TABS
1000.0000 [IU] | ORAL_TABLET | Freq: Every morning | ORAL | Status: DC
  Administered 2017-06-01 – 2017-06-02 (×2): 1000 [IU] via ORAL
  Filled 2017-06-01 (×3): qty 1

## 2017-06-01 MED ORDER — METFORMIN HCL 500 MG PO TABS
500.0000 mg | ORAL_TABLET | Freq: Every day | ORAL | Status: DC
  Administered 2017-06-02: 500 mg via ORAL
  Filled 2017-06-01: qty 1

## 2017-06-01 MED ORDER — SERTRALINE HCL 50 MG PO TABS: 25.0000 mg | ORAL_TABLET | Freq: Every day | ORAL | Status: DC

## 2017-06-01 MED ORDER — ESCITALOPRAM OXALATE 10 MG PO TABS
10.0000 mg | ORAL_TABLET | Freq: Every day | ORAL | Status: DC
  Administered 2017-06-01: 10 mg via ORAL
  Filled 2017-06-01 (×2): qty 1

## 2017-06-01 MED ORDER — BUSPIRONE HCL 10 MG PO TABS
10.0000 mg | ORAL_TABLET | Freq: Two times a day (BID) | ORAL | Status: DC
  Administered 2017-06-01 – 2017-06-02 (×3): 10 mg via ORAL
  Filled 2017-06-01 (×3): qty 1

## 2017-06-01 MED ORDER — LOSARTAN POTASSIUM 50 MG PO TABS
100.0000 mg | ORAL_TABLET | Freq: Every day | ORAL | Status: DC
  Administered 2017-06-01 – 2017-06-02 (×2): 100 mg via ORAL
  Filled 2017-06-01 (×2): qty 2

## 2017-06-01 MED ORDER — FAMOTIDINE 20 MG PO TABS
10.0000 mg | ORAL_TABLET | Freq: Every day | ORAL | Status: DC
  Administered 2017-06-01 – 2017-06-02 (×2): 10 mg via ORAL
  Filled 2017-06-01 (×2): qty 1

## 2017-06-01 MED ORDER — ALPRAZOLAM 0.25 MG PO TABS: 0.2500 mg | ORAL_TABLET | Freq: Every evening | ORAL | Status: DC | PRN

## 2017-06-01 MED ORDER — CLOPIDOGREL BISULFATE 75 MG PO TABS
75.0000 mg | ORAL_TABLET | Freq: Every day | ORAL | Status: DC
  Administered 2017-06-01 – 2017-06-02 (×2): 75 mg via ORAL
  Filled 2017-06-01 (×2): qty 1

## 2017-06-01 NOTE — Progress Notes (Signed)
Paged MD about needing orders for a liver function test. Md said she would put them in.

## 2017-06-01 NOTE — Evaluation (Signed)
Physical Therapy Evaluation Patient Details Name: Kenneth Franklin MRN: [id number] DOB: November 14, 1946 Today's Date: 06/01/2017   History of Present Illness  Kenneth Franklin is a 71yo white male who comes to [facility] on 7/4 d/t thoughts of taking his own life, increased ETOH use, and decreased food intake. 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 Pt reports hsi wife dies about 18 months ago and he has increasingly become tearfully sad about this. Pt worked with [facility] for 11 sessions completed in October 2017 related to CVA, and DC with excellent functional mobility. At DC, 5xSTS: 11.5s, 10MWT: 1.41m/s, and BBT: 56/56. Pt uses a quad cane in the community, no AD in home, and has since returned to work without complaint. He endorses no acute changes to his mobility since DC, reporting 1 fall which he attibutes to ETOH consumption.   Clinical Impression  Pt admitted with above diagnosis. Pt currently with functional limitations due to the deficits listed below (see "PT Problem List"). Upon entry, the patient is received semirecumbent in bed, sitter present. The pt is awake and agreeable to participate. No acute distress noted at this time. The pt is alert and oriented x3, pleasant, conversational, and following simple and multi-step commands consistently. Functional mobility assessment demonstrates performance at baseline functional level. No acute mobility deficits. Pt reports he has maintained all progress completed in OPPT in late 2017. No skilled PT services needed at this time. PT signing off.     Follow Up Recommendations No PT follow up    Equipment Recommendations  None recommended by PT    Recommendations for Other Services       Precautions / Restrictions Precautions Precautions: Fall Precaution Comments: sitter for SI.      Mobility  Bed Mobility Overal bed mobility: Independent                Transfers Overall transfer level: Independent                   Ambulation/Gait Ambulation/Gait assistance: Supervision Ambulation Distance (Feet): 350 Feet Assistive device: None   Gait velocity: 0.23m/s Gait velocity interpretation: Below normal speed for age/gender General Gait Details: decreased time on LLE, with decreased knee flexion/ ankle PF during swing on LLE.   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      Hand Dominance        Extremity/Trunk Assessment   Upper Extremity Assessment Upper Extremity Assessment: Overall WFL for tasks assessed    Lower Extremity Assessment Lower Extremity Assessment: Overall WFL for tasks assessed       Communication   Communication: No difficulties  Cognition Arousal/Alertness: Awake/alert Behavior During Therapy: WFL for tasks assessed/performed Overall Cognitive Status:  Within Functional Limits for tasks assessed                                 General Comments: reports to feel slightly confused.       General Comments      Exercises     Assessment/Plan    PT Assessment Patent does not need any further PT services  PT Problem List         PT Treatment Interventions      PT Goals (Current goals can be found in the Care Plan section)  Acute Rehab PT Goals PT Goal Formulation: All assessment and education complete, DC therapy    Frequency     Barriers to discharge        Co-evaluation               AM-PAC PT "6 Clicks" Daily Activity  Outcome  Measure Difficulty turning over in bed (including adjusting bedclothes, sheets and blankets)?: None Difficulty moving from lying on back to sitting on the side of the bed? : None Difficulty sitting down on and standing up from a chair with arms (e.g., wheelchair, bedside commode, etc,.)?: None Help needed moving to and from a bed to chair (including a wheelchair)?: None Help needed walking in hospital room?: A Little Help needed climbing 3-5 steps with a railing? : A Lot 6 Click Score: 21    End of Session Equipment Utilized During Treatment: Gait belt Activity Tolerance: Patient tolerated treatment well;Patient limited by fatigue Patient left: in chair;with nursing/sitter in room Nurse Communication: Mobility status PT Visit Diagnosis: History of falling (Z91.81)    Time: 5916-3846 PT Time Calculation (min) (ACUTE ONLY): 8 min   Charges:   PT Evaluation $PT Eval Low Complexity: 1 Procedure     PT G Codes:       9:22 AM, 2017-06-16 Etta Grandchild, PT, DPT Physical Therapist - [facility] [phone] ([place])  [phone] (mobile)     Buccola,[name] 2017-06-16, 9:19 AM

## 2017-06-01 NOTE — Progress Notes (Signed)
Kenneth Franklin responded to a consult for pt who had suicidal thoughts. Kenneth Franklin met pt who talked to Kenneth Franklin about his life, loss of his wife who passed away last May 09, 2023, his struggles with poor health, and his need for physical therapy to be able to return to normal life. While talking to pt, Kenneth Franklin got RR page and had to leave to attend to emergency in Kenneth Franklin and returned at 2:20pm to continue with the conversation started earlier. Pt's step daughter, Kenneth Franklin, and pt's friend, Kenneth Franklin, was at the bedside. Pt talked about his plan for recovery with Kenneth Franklin and mentioned going back to church, praying, using a support system that he already have, having physical therapy, and eating well. Pt has a stomach problem that has affected his eating habits. Pt denied having suicidal thought, but admitted drinking a little on the day his broke down. CH provided emotional, spiritual and prayer support to pt and his family and friend. Kenneth Franklin will do a follow up with pt as needed.    06/01/17 1500  Clinical Encounter Type  Visited With Patient and family together;Health care provider  Visit Type Initial;Follow-up;Spiritual support;Behavioral Health  Referral From Nurse  Consult/Referral To Chaplain  Spiritual Encounters  Spiritual Needs Prayer;Emotional;Other (Comment)

## 2017-06-01 NOTE — Clinical Social Work Note (Signed)
Clinical Social Work Assessment  Patient Details  Name: Kenneth Franklin MRN: [id number] Date of Birth: 11-28-1946  Date of referral:  06/01/17               Reason for consult:  Community Resources, Mental Health Concerns, Suicide Risk/Attempt                Permission sought to share information with:    Permission granted to share information::     Name::        Agency::     Relationship::     Contact Information:     Housing/Transportation Living arrangements for the past 2 months:  Single Family Home Source of Information:  Patient Patient Interpreter Needed:  None Criminal Activity/Legal Involvement Pertinent to Current Situation/Hospitalization:  No - Comment as needed Significant Relationships:  Other Family Members Lives with:  Self Do you feel safe going back to the place where you live?  Yes Need for family participation in patient care:  No (Coment)  Care giving concerns:  Patient lives in [place] alone.    Social Worker assessment / plan:  Holiday representative ([place]) reviewed chart and noted that patient is under IVC and psych consult is pending. PT is recommending no follow up. CSW met with patient and sitter was at bedside. Patient was alert and oriented X4 and was laying in the bed. CSW introduced self and explained role of CSW department. Patient reported that he lives alone and his wife passed away in 21-May-2016. Patient reported that he has no children however his wife had 2 children and several grandchildren. Patient reported that he works full time at [employer] that he has been at for 30 years. Patient reported that he drives and is independent with his ADL's. Patient reported that he drinks alcohol occasionally and doesn't recall taking tylenol. Patient reported that he does not have tylenol. Patient reported that he doesn't have thoughts about hurting himself. CSW provided patient with a list of outpatient substance abuse resources and community  resources in [place]. Patient reported no needs or concerns at this time. Please reconsult if future social work needs arise. CSW signing off.   Employment status:  Therapist, music:  Medicare, Managed Care PT Recommendations:  No Follow Up Information / Referral to community resources:  Outpatient Substance Abuse Treatment Options  Patient/Family's Response to care:  Patient accepted substance abuse resources.   Patient/Family's Understanding of and Emotional Response to Diagnosis, Current Treatment, and Prognosis:  Patient was very pleasant and thanked CSW for visit.   Emotional Assessment Appearance:  Appears stated age Attitude/Demeanor/Rapport:    Affect (typically observed):  Accepting, Adaptable, Pleasant Orientation:  Oriented to Self, Oriented to Place, Oriented to  Time, Oriented to Situation Alcohol / Substance use:  Alcohol Use Psych involvement (Current and /or in the community):  Yes (Comment) (Psych consult pending. )  Discharge Needs  Concerns to be addressed:  Discharge Planning Concerns Readmission within the last 30 days:  No Current discharge risk:  Psychiatric Illness Barriers to Discharge:  Continued Medical Work up   [employer], Veronia Beets, LCSW 06/01/2017, 4:37 PM

## 2017-06-01 NOTE — Plan of Care (Signed)
Problem: Safety: Goal: Ability to remain free from injury will improve Outcome: Progressing Pt voices no suicidal thoughts this shift. Sitter at bedside.

## 2017-06-01 NOTE — Progress Notes (Signed)
Kenneth Franklin: Kenneth Franklin    MR#:  [id number]  DATE OF BIRTH:  [birth year]  SUBJECTIVE:  Complains of feeling nauseous secondary to [place] Denies any suicidal ideation today Express loneliness after wife's death. Admits to drinking alcohol more than usual. Denies any overuse of Tylenol. He was apprised time levels were shown elevated and his blood work  REVIEW OF SYSTEMS:   Review of Systems  Constitutional: Negative for chills, fever and weight loss.  HENT: Negative for ear discharge, ear pain and nosebleeds.   Eyes: Negative for blurred vision, pain and discharge.  Respiratory: Negative for sputum production, shortness of breath, wheezing and stridor.   Cardiovascular: Negative for chest pain, palpitations, orthopnea and PND.  Gastrointestinal: Negative for abdominal pain, diarrhea, nausea and vomiting.  Genitourinary: Negative for frequency and urgency.  Musculoskeletal: Negative for back pain and joint pain.  Neurological: Negative for sensory change, speech change, focal weakness and weakness.  Psychiatric/Behavioral: Positive for depression, substance abuse and suicidal ideas. Negative for hallucinations. The patient is not nervous/anxious.    Tolerating Diet:yes Tolerating PT: NO PT Needed  DRUG ALLERGIES:   Allergies  Allergen Reactions  . Propoxyphene     VITALS:  Blood pressure (!) 177/87, pulse 81, temperature 98.4 F (36.9 C), temperature source Oral, resp. rate 18, height 6\' 1"  (1.854 m), weight 72.6 kg (160 lb), SpO2 96 %.  PHYSICAL EXAMINATION:   Physical Exam  GENERAL:  71 y.o.-year-old patient lying in the bed with no acute distress.  EYES: Pupils equal, round, reactive to light and accommodation. No scleral icterus. Extraocular muscles intact.  HEENT: Head atraumatic, normocephalic. Oropharynx and nasopharynx clear.  NECK:  Supple, no jugular venous distention. No thyroid enlargement, no  tenderness.  LUNGS: Normal breath sounds bilaterally, no wheezing, rales, rhonchi. No use of accessory muscles of respiration.  CARDIOVASCULAR: S1, S2 normal. No murmurs, rubs, or gallops.  ABDOMEN: Soft, nontender, nondistended. Bowel sounds present. No organomegaly or mass.  EXTREMITIES: No cyanosis, clubbing or edema b/l.    NEUROLOGIC: Cranial nerves II through XII are intact. No focal Motor or sensory deficits b/l.   PSYCHIATRIC:  patient is alert and oriented x 3.  SKIN: No obvious rash, lesion, or ulcer.   LABORATORY PANEL:  CBC  Recent Labs Lab 06/01/17 0327  WBC 7.9  HGB 14.9  HCT 43.9  PLT 198    Chemistries   Recent Labs Lab 06/01/17 0327  NA 140  K 4.2  CL 103  CO2 29  GLUCOSE 131*  BUN 9  CREATININE 0.94  CALCIUM 8.2*  AST 19  ALT 10*  ALKPHOS 53  BILITOT 1.2   Cardiac Enzymes No results for input(s): TROPONINI in the last 168 hours. RADIOLOGY:  Ct Head Wo Contrast  Result Date: 05/31/2017 CLINICAL DATA:  Pt arrived via EMS for pt reports he fell last night and his friend thought he needed to get some help because he has not eaten in 3 days - he has drank red wine mostly for three days - he denies any pain or injury from the fall, hx of stroke EXAM: CT HEAD WITHOUT CONTRAST TECHNIQUE: Contiguous axial images were obtained from the base of the skull through the vertex without intravenous contrast. COMPARISON:  None. FINDINGS: Brain: There is central and cortical atrophy. Periventricular white matter changes are consistent with small vessel disease. There is no intra or extra-axial fluid collection or mass lesion. The basilar cisterns and ventricles  have a normal appearance. There is no CT evidence for acute infarction or hemorrhage. Vascular: There is atherosclerotic calcification of the internal carotid arteries. Skull: Normal. Negative for fracture or focal lesion. Sinuses/Orbits: No acute finding. Other: None IMPRESSION: 1.  No evidence for acute intracranial  abnormality. 2. Atrophy and small vessel disease. Electronically Signed   By: Nolon Nations M.D.   On: 05/31/2017 18:56   ASSESSMENT AND PLAN:  71 y.o. male with a history of CVA, depression, diabetes, GERD, hypertension now being admitted with:  #. Acetaminophen overdose ? Pt denies any over use -recieved oral NAC---tylenol level <10 today. D/c NAC -Poison control was informed yday  #. Alcohol intoxication -Monitor for signs of alcohol withdrawal. -CIWA scoring 1   #. Suicidal ideation/depression -1-1 sitter -Involuntary commitment -Psych consult pending -Need to clarify and reconcile home medications as he is on multiple antidepressants and benzos  #. History of CVA - Continue Plavix  #. History of hypertension - Continue Cozaar  #. History of GERD - Continue Zantac  #. History of H/o Diabetes - Accuchecks achs with RISS coverage - Heart healthy, carb controlled diet -resumed metformin  Medically stable for d/c. Pending psych evaluation  Case discussed with Care Management/Social Worker. Management plans discussed with the patient, family and they are in agreement.  CODE STATUS: full  DVT Prophylaxis: lovenox  TOTAL TIME TAKING CARE OF THIS PATIENT: 30 minutes.  >50% time spent on counselling and coordination of care  D/c pending psych evaluation  Note: This dictation was prepared with Dragon dictation along with smaller phrase technology. Any transcriptional errors that result from this process are unintentional.  Cage Gupton M.D on 06/01/2017 at 11:11 AM  Between 7am to 6pm - Pager - [phone]  After 6pm go to [email or web address] - password [email or web address] [facility]  Office  [phone]  CC: Primary care physician; Roselee Nova, MD

## 2017-06-01 NOTE — ED Notes (Signed)
[facility] at bedside for transport

## 2017-06-01 NOTE — Consult Note (Signed)
[facility] Consult   Reason for Consult:  This is a consult for this 71 year old man who came into the hospital after passing out at at home. Concern about depression and suicidality. Referring Physician:  Posey Pronto Patient Identification: Kenneth Franklin MRN:  [id number] Principal Diagnosis: Severe recurrent major depression without psychotic features [facility]) Diagnosis:   Patient Active Problem List   Diagnosis Date Noted  . Severe recurrent major depression without psychotic features ([place]) [F33.2] 06/01/2017  . Alcohol abuse [F10.10] 06/01/2017  . Tylenol overdose [T39.1X1A] 05/31/2017  . Erroneous encounter - disregard [ERROR EN] 08/02/2016  . Acute ischemic stroke ([place]) [I63.9] 07/07/2016  . Tick bite of flank [S30.861A, W57.XXXA] 06/16/2016  . Grief counseling [Z71.89] 06/16/2016  . Fatigue [R53.83] 06/16/2016  . Absent pulse in lower extremity [R09.89] 09/14/2015  . Anxiety and depression [F41.9, F32.9] 04/21/2015  . Diabetes mellitus type 2, controlled, without complications ([place]) [Y86.5] 04/21/2015  . Acid reflux [K21.9] 04/21/2015  . Calcium blood increased [E83.52] 04/21/2015  . Dyslipidemia [E78.5] 04/21/2015  . HLD (hyperlipidemia) [E78.5] 04/21/2015  . BP (high blood pressure) [I10] 04/21/2015    Total Time spent with patient: 1 hour  Subjective:   Kenneth Franklin is a 71 y.o. male patient admitted with "I have been feeling bad".  HPI:  Patient seen chart reviewed. 71 year old man came into the hospital after falling down at home. Friends noticed it and felt that he clearly needed help. Reportedly hadn't been eating for 3 days and instead had been drinking heavily. Patient admits to all of this. He said that he drank a large size bottle of wine over a couple days and hasn't been eating in days. He also stopped taking all of his medicine. Patient admits that he's been depressed and felt very down at least ever since his wife passed away a year ago. However, it  has been getting worse the last few months. Mood stays down most of the time and less he is actively doing something to distract himself. He can only sleep by taking sleeping medicine at night. Appetite has been poor. He has been troubled by some recent abdominal pain which is still being worked up. Patient denies any psychotic symptoms. He admits that he feels hopeless and like giving up at times but denies that he has ever tried to kill himself or has any plan or intention to kill himself. Patient has been prescribed medicine for depression and anxiety by his primary care doctor and says that other than the last few days he has been compliant with it. It looks like he's probably taking Lexapro 20 mg a day plus BuSpar and Xanax at home.  Medical history: Had a stroke last on him. Had left sided weakness but has mostly recovered from it. Patient has diabetes dyslipidemia and high blood pressure.  Social history: Lives by himself. He does have adult children in the area and tries to stay in contact with him. Reports that he has a good relationship with his children and grandchildren. Remarkably he is still managing to go to work regularly. Works in a Writer. Sounds like he does have some support from neighbors as well. Wife passed away last summer and patient continues to grieve very hard about it.  Substance abuse history: Had been drinking heavily for the last 2 or 3 days. Says that prior to that he had not had any alcohol at all in months. He doesn't think he's really had a ongoing alcohol problem. Doesn't abuse any  other drugs. No history of withdrawal complications.  Past Psychiatric History: No psychiatric hospitalization. No prior suicide attempts. No history of mania or psychosis. He is currently taking Lexapro 20 mg a day and appears to of been on that for months. Also low dose BuSpar and Xanax when necessary. From the medicine reconciliation looks like he's been tried on Zoloft before. Patient  doesn't have any memory of it doesn't know much in the way of specifics about medicine.  Risk to Self: Suicidal Ideation: Yes-Currently Present Suicidal Intent: No Is patient at risk for suicide?: Yes Suicidal Plan?: No Access to Means: No What has been your use of drugs/alcohol within the last 12 months?: Occassional use of alcohol How many times?: 0 Other Self Harm Risks: denied Triggers for Past Attempts: None known Intentional Self Injurious Behavior: None Risk to Others: Homicidal Ideation: No Thoughts of Harm to Others: No Current Homicidal Intent: No Current Homicidal Plan: No Access to Homicidal Means: No Identified Victim: None identified History of harm to others?: No Assessment of Violence: None Noted Does patient have access to weapons?: No Criminal Charges Pending?: No Does patient have a court date: No Prior Inpatient Therapy: Prior Inpatient Therapy: No Prior Therapy Dates: n/a Prior Therapy Facilty/Provider(s): n/a Reason for Treatment: n/a Prior Outpatient Therapy: Prior Outpatient Therapy: No Prior Therapy Dates: n/a Prior Therapy Facilty/Provider(s): n/a Reason for Treatment: n/a Does patient have an ACCT team?: No Does patient have Intensive In-House Services?  : No Does patient have [facility] services? : No Does patient have P4CC services?: No  Past Medical History:  Past Medical History:  Diagnosis Date  . Anxiety   . Depression   . Diabetes mellitus without complication ([place])   . Hyperlipidemia   . Hypertension     Past Surgical History:  Procedure Laterality Date  . CATARACT EXTRACTION Bilateral    Family History:  Family History  Problem Relation Age of Onset  . Diabetes Brother    Family Psychiatric  History: Thinks he had a sister who might of had some mood problems but no family history of suicide Social History:  History  Alcohol Use  . Yes    Comment: daily wine - several a day     History  Drug Use No    Social History    Social History  . Marital status: Widowed    Spouse name: N/A  . Number of children: N/A  . Years of education: N/A   Social History Main Topics  . Smoking status: Current Some Day Smoker    Types: Cigars  . Smokeless tobacco: Never Used  . Alcohol use Yes     Comment: daily wine - several a day  . Drug use: No  . Sexual activity: No   Other Topics Concern  . None   Social History Narrative  . None   Additional Social History:    Allergies:   Allergies  Allergen Reactions  . Propoxyphene     Labs:  Results for orders placed or performed during the hospital encounter of 05/31/17 (from the past 48 hour(s))  Comprehensive metabolic panel     Status: Abnormal   Collection Time: 05/31/17  6:36 PM  Result Value Ref Range   Sodium 137 135 - 145 mmol/L   Potassium 4.0 3.5 - 5.1 mmol/L   Chloride 102 101 - 111 mmol/L   CO2 24 22 - 32 mmol/L   Glucose, Bld 178 (H) 65 - 99 mg/dL   BUN 8 6 -  20 mg/dL   Creatinine, Ser 0.96 0.61 - 1.24 mg/dL   Calcium 8.4 (L) 8.9 - 10.3 mg/dL   Total Protein 7.0 6.5 - 8.1 g/dL   Albumin 4.1 3.5 - 5.0 g/dL   AST 21 15 - 41 U/L   ALT 10 (L) 17 - 63 U/L   Alkaline Phosphatase 60 38 - 126 U/L   Total Bilirubin 0.8 0.3 - 1.2 mg/dL   GFR calc non Af Amer >60 >60 mL/min   GFR calc Af Amer >60 >60 mL/min    Comment: (NOTE) The eGFR has been calculated using the CKD EPI equation. This calculation has not been validated in all clinical situations. eGFR's persistently <60 mL/min signify possible Chronic Kidney Disease.    Anion gap 11 5 - 15  Salicylate level     Status: None   Collection Time: 05/31/17  6:36 PM  Result Value Ref Range   Salicylate Lvl <1.6 2.8 - 30.0 mg/dL  Acetaminophen level     Status: Abnormal   Collection Time: 05/31/17  6:36 PM  Result Value Ref Range   Acetaminophen (Tylenol), Serum 78 (H) 10 - 30 ug/mL    Comment:        THERAPEUTIC CONCENTRATIONS VARY SIGNIFICANTLY. A RANGE OF 10-30 ug/mL MAY BE AN  EFFECTIVE CONCENTRATION FOR MANY PATIENTS. HOWEVER, SOME ARE BEST TREATED AT CONCENTRATIONS OUTSIDE THIS RANGE. ACETAMINOPHEN CONCENTRATIONS >150 ug/mL AT 4 HOURS AFTER INGESTION AND >50 ug/mL AT 12 HOURS AFTER INGESTION ARE OFTEN ASSOCIATED WITH TOXIC REACTIONS.   Ethanol     Status: Abnormal   Collection Time: 05/31/17  6:36 PM  Result Value Ref Range   Alcohol, Ethyl (B) 102 (H) <5 mg/dL    Comment:        LOWEST DETECTABLE LIMIT FOR SERUM ALCOHOL IS 5 mg/dL FOR MEDICAL PURPOSES ONLY   CBC WITH DIFFERENTIAL     Status: None   Collection Time: 05/31/17  6:36 PM  Result Value Ref Range   WBC 7.6 3.8 - 10.6 K/uL   RBC 5.24 4.40 - 5.90 MIL/uL   Hemoglobin 16.6 13.0 - 18.0 g/dL   HCT 49.0 40.0 - 52.0 %   MCV 93.7 80.0 - 100.0 fL   MCH 31.8 26.0 - 34.0 pg   MCHC 33.9 32.0 - 36.0 g/dL   RDW 14.3 11.5 - 14.5 %   Platelets 217 150 - 440 K/uL   Neutrophils Relative % 68 %   Neutro Abs 5.2 1.4 - 6.5 K/uL   Lymphocytes Relative 24 %   Lymphs Abs 1.8 1.0 - 3.6 K/uL   Monocytes Relative 7 %   Monocytes Absolute 0.5 0.2 - 1.0 K/uL   Eosinophils Relative 0 %   Eosinophils Absolute 0.0 0 - 0.7 K/uL   Basophils Relative 1 %   Basophils Absolute 0.0 0 - 0.1 K/uL  Glucose, capillary     Status: Abnormal   Collection Time: 06/01/17 12:18 AM  Result Value Ref Range   Glucose-Capillary 132 (H) 65 - 99 mg/dL  Urine Drug Screen, Qualitative     Status: None   Collection Time: 06/01/17  2:50 AM  Result Value Ref Range   Tricyclic, Ur Screen NONE DETECTED NONE DETECTED   Amphetamines, Ur Screen NONE DETECTED NONE DETECTED   MDMA (Ecstasy)Ur Screen NONE DETECTED NONE DETECTED   Cocaine Metabolite,Ur [place] NONE DETECTED NONE DETECTED   Opiate, Ur Screen NONE DETECTED NONE DETECTED   Phencyclidine (PCP) Ur S NONE DETECTED NONE DETECTED   Cannabinoid 50  Ng, Ur [place] NONE DETECTED NONE DETECTED   Barbiturates, Ur Screen NONE DETECTED NONE DETECTED   Benzodiazepine, Ur Scrn NONE DETECTED NONE  DETECTED   Methadone Scn, Ur NONE DETECTED NONE DETECTED    Comment: (NOTE) 017  Tricyclics, urine               Cutoff 1000 ng/mL 200  Amphetamines, urine             Cutoff 1000 ng/mL 300  MDMA (Ecstasy), urine           Cutoff 500 ng/mL 400  Cocaine Metabolite, urine       Cutoff 300 ng/mL 500  Opiate, urine                   Cutoff 300 ng/mL 600  Phencyclidine (PCP), urine      Cutoff 25 ng/mL 700  Cannabinoid, urine              Cutoff 50 ng/mL 800  Barbiturates, urine             Cutoff 200 ng/mL 900  Benzodiazepine, urine           Cutoff 200 ng/mL 1000 Methadone, urine                Cutoff 300 ng/mL 1100 1200 The urine drug screen provides only a preliminary, unconfirmed 1300 analytical test result and should not be used for non-medical 1400 purposes. Clinical consideration and professional judgment should 1500 be applied to any positive drug screen result due to possible 1600 interfering substances. A more specific alternate chemical method 1700 must be used in order to obtain a confirmed analytical result.  1800 Gas chromato graphy / mass spectrometry (GC/MS) is the preferred 1900 confirmatory method.   Urinalysis, Complete w Microscopic     Status: Abnormal   Collection Time: 06/01/17  2:50 AM  Result Value Ref Range   Color, Urine YELLOW (A) YELLOW   APPearance CLEAR (A) CLEAR   Specific Gravity, Urine 1.039 (H) 1.005 - 1.030   pH 5.0 5.0 - 8.0   Glucose, UA 50 (A) NEGATIVE mg/dL   Hgb urine dipstick NEGATIVE NEGATIVE   Bilirubin Urine NEGATIVE NEGATIVE   Ketones, ur 5 (A) NEGATIVE mg/dL   Protein, ur NEGATIVE NEGATIVE mg/dL   Nitrite NEGATIVE NEGATIVE   Leukocytes, UA NEGATIVE NEGATIVE   RBC / HPF NONE SEEN 0 - 5 RBC/hpf   WBC, UA 0-5 0 - 5 WBC/hpf   Bacteria, UA NONE SEEN NONE SEEN   Squamous Epithelial / LPF NONE SEEN NONE SEEN   Mucous PRESENT   CBC     Status: None   Collection Time: 06/01/17  3:27 AM  Result Value Ref Range   WBC 7.9 3.8 - 10.6 K/uL    RBC 4.71 4.40 - 5.90 MIL/uL   Hemoglobin 14.9 13.0 - 18.0 g/dL   HCT 43.9 40.0 - 52.0 %   MCV 93.2 80.0 - 100.0 fL   MCH 31.6 26.0 - 34.0 pg   MCHC 33.9 32.0 - 36.0 g/dL   RDW 14.1 11.5 - 14.5 %   Platelets 198 150 - 440 K/uL  Comprehensive metabolic panel     Status: Abnormal   Collection Time: 06/01/17  3:27 AM  Result Value Ref Range   Sodium 140 135 - 145 mmol/L   Potassium 4.2 3.5 - 5.1 mmol/L   Chloride 103 101 - 111 mmol/L   CO2 29 22 - 32 mmol/L  Glucose, Bld 131 (H) 65 - 99 mg/dL   BUN 9 6 - 20 mg/dL   Creatinine, Ser 0.94 0.61 - 1.24 mg/dL   Calcium 8.2 (L) 8.9 - 10.3 mg/dL   Total Protein 6.8 6.5 - 8.1 g/dL   Albumin 3.9 3.5 - 5.0 g/dL   AST 19 15 - 41 U/L   ALT 10 (L) 17 - 63 U/L   Alkaline Phosphatase 53 38 - 126 U/L   Total Bilirubin 1.2 0.3 - 1.2 mg/dL   GFR calc non Af Amer >60 >60 mL/min   GFR calc Af Amer >60 >60 mL/min    Comment: (NOTE) The eGFR has been calculated using the CKD EPI equation. This calculation has not been validated in all clinical situations. eGFR's persistently <60 mL/min signify possible Chronic Kidney Disease.    Anion gap 8 5 - 15  Glucose, capillary     Status: Abnormal   Collection Time: 06/01/17  7:24 AM  Result Value Ref Range   Glucose-Capillary 116 (H) 65 - 99 mg/dL  Protime-INR     Status: None   Collection Time: 06/01/17  9:45 AM  Result Value Ref Range   Prothrombin Time 15.2 11.4 - 15.2 seconds   INR 1.19   Acetaminophen level     Status: Abnormal   Collection Time: 06/01/17  9:45 AM  Result Value Ref Range   Acetaminophen (Tylenol), Serum <10 (L) 10 - 30 ug/mL    Comment:        THERAPEUTIC CONCENTRATIONS VARY SIGNIFICANTLY. A RANGE OF 10-30 ug/mL MAY BE AN EFFECTIVE CONCENTRATION FOR MANY PATIENTS. HOWEVER, SOME ARE BEST TREATED AT CONCENTRATIONS OUTSIDE THIS RANGE. ACETAMINOPHEN CONCENTRATIONS >150 ug/mL AT 4 HOURS AFTER INGESTION AND >50 ug/mL AT 12 HOURS AFTER INGESTION ARE OFTEN ASSOCIATED WITH  TOXIC REACTIONS.   Glucose, capillary     Status: Abnormal   Collection Time: 06/01/17 12:00 PM  Result Value Ref Range   Glucose-Capillary 118 (H) 65 - 99 mg/dL  Glucose, capillary     Status: Abnormal   Collection Time: 06/01/17  4:43 PM  Result Value Ref Range   Glucose-Capillary 102 (H) 65 - 99 mg/dL    Current Facility-Administered Medications  Medication Dose Route Frequency Provider Last Rate Last Dose  . albuterol (PROVENTIL) (2.5 MG/3ML) 0.083% nebulizer solution 2.5 mg  2.5 mg Nebulization Q6H PRN Hugelmeyer, Alexis, DO      . ALPRAZolam Duanne Moron) tablet 0.25 mg  0.25 mg Oral QHS PRN Fritzi Mandes, MD      . alum & mag hydroxide-simeth (MAALOX/MYLANTA) 200-200-20 MG/5ML suspension 30 mL  30 mL Oral Q4H PRN Hugelmeyer, Alexis, DO   30 mL at 06/01/17 1737  . atorvastatin (LIPITOR) tablet 80 mg  80 mg Oral q1800 Fritzi Mandes, MD   80 mg at 06/01/17 1737  . bisacodyl (DULCOLAX) EC tablet 5 mg  5 mg Oral Daily PRN Hugelmeyer, Alexis, DO      . busPIRone (BUSPAR) tablet 10 mg  10 mg Oral BID Fritzi Mandes, MD   10 mg at 06/01/17 1000  . cholecalciferol (VITAMIN D) tablet 1,000 Units  1,000 Units Oral q morning - 10a Fritzi Mandes, MD   1,000 Units at 06/01/17 0955  . clopidogrel (PLAVIX) tablet 75 mg  75 mg Oral Daily Fritzi Mandes, MD   75 mg at 06/01/17 0955  . enoxaparin (LOVENOX) injection 40 mg  40 mg Subcutaneous Q24H Hugelmeyer, Alexis, DO   40 mg at 06/01/17 0826  . escitalopram (LEXAPRO) tablet 10  mg  10 mg Oral QHS ,  T, MD      . famotidine (PEPCID) tablet 10 mg  10 mg Oral Daily Fritzi Mandes, MD   10 mg at 06/01/17 0954  . insulin aspart (novoLOG) injection 0-15 Units  0-15 Units Subcutaneous TID WC Hugelmeyer, Alexis, DO      . insulin aspart (novoLOG) injection 0-5 Units  0-5 Units Subcutaneous QHS Hugelmeyer, Alexis, DO      . ipratropium (ATROVENT) nebulizer solution 0.5 mg  0.5 mg Nebulization Q6H PRN Hugelmeyer, Alexis, DO      . losartan (COZAAR) tablet 100 mg  100  mg Oral Daily Fritzi Mandes, MD   100 mg at 06/01/17 0955  . magnesium citrate solution 1 Bottle  1 Bottle Oral Once PRN Hugelmeyer, Alexis, DO      . [START ON 06/02/2017] metFORMIN (GLUCOPHAGE) tablet 500 mg  500 mg Oral Q breakfast Fritzi Mandes, MD      . mirtazapine (REMERON) tablet 30 mg  30 mg Oral QHS ,  T, MD      . ondansetron (ZOFRAN) tablet 4 mg  4 mg Oral Q6H PRN Hugelmeyer, Alexis, DO       Or  . ondansetron (ZOFRAN) injection 4 mg  4 mg Intravenous Q6H PRN Hugelmeyer, Alexis, DO   4 mg at 06/01/17 1023  . oxyCODONE (Oxy IR/ROXICODONE) immediate release tablet 5 mg  5 mg Oral Q4H PRN Hugelmeyer, Alexis, DO      . senna-docusate (Senokot-S) tablet 1 tablet  1 tablet Oral QHS PRN Hugelmeyer, Alexis, DO      . sodium chloride flush (NS) 0.9 % injection 3 mL  3 mL Intravenous Q12H Hugelmeyer, Alexis, DO   3 mL at 06/01/17 0016    Musculoskeletal: Strength & Muscle Tone: decreased Gait & Station: unsteady Patient leans: N/A  Psychiatric Specialty Exam: Physical Exam  Nursing note and vitals reviewed. Constitutional: He appears well-developed.  HENT:  Head: Normocephalic and atraumatic.    Eyes: Conjunctivae are normal. Pupils are equal, round, and reactive to light.  Neck: Normal range of motion.  Cardiovascular: Regular rhythm and normal heart sounds.   Respiratory: Effort normal. No respiratory distress.  GI: Soft.  Musculoskeletal: Normal range of motion.  Neurological: He is alert.  Skin: Skin is warm and dry.  Psychiatric: Judgment normal. His speech is delayed. He is slowed and withdrawn. Thought content is not paranoid. Cognition and memory are normal. He exhibits a depressed mood. He expresses no homicidal and no suicidal ideation. He expresses no suicidal plans.    Review of Systems  Constitutional: Positive for malaise/fatigue and weight loss.  HENT: Negative.   Eyes: Negative.   Respiratory: Negative.   Cardiovascular: Negative.   Gastrointestinal:  Positive for abdominal pain, nausea and vomiting.  Musculoskeletal: Negative.   Skin: Negative.   Neurological: Negative.   Psychiatric/Behavioral: Positive for depression, substance abuse and suicidal ideas. Negative for hallucinations and memory loss. The patient is nervous/anxious and has insomnia.     Blood pressure (!) 153/80, pulse 78, temperature 98.6 F (37 C), resp. rate 18, height 6' 1" (1.854 m), weight 72.6 kg (160 lb), SpO2 95 %.Body mass index is 21.11 kg/m.  General Appearance: Disheveled  Eye Contact:  Fair  Speech:  Slow  Volume:  Decreased  Mood:  Depressed  Affect:  Congruent  Thought Process:  Goal Directed  Orientation:  Full (Time, Place, and Person)  Thought Content:  Logical  Suicidal Thoughts:  No  Homicidal Thoughts:  No  Memory:  Immediate;   Good Recent;   Fair Remote;   Fair  Judgement:  Fair  Insight:  Fair  Psychomotor Activity:  Decreased  Concentration:  Concentration: Fair  Recall:  [employer] of Knowledge:  Fair  Language:  Fair  Akathisia:  No  Handed:  Right  AIMS (if indicated):     Assets:  Communication Skills Desire for Improvement Housing Resilience Social Support  ADL's:  Intact  Cognition:  WNL  Sleep:        Treatment Plan Summary: Medication management and Plan 70 year old man who came into the hospital intoxicated not eating for several days. Concern was particularly raised because his acetaminophen level was elevated to 70 on admission. Patient swears that he had not intentionally tried to overdose on anything. He says that he normally drinks cough medicine to try to put himself to sleep at night and that they looked at it and found that it had acetaminophen in it. Patient however does admit to multiple symptoms of depression which sound like they've been getting worse recently. He does not at this point require inpatient psychiatric hospitalization or commitment and I am taking him off the IVC. Nevertheless I tried to  communicate to him and his daughter that I was very concerned about his depression. I strongly advised him to discontinue the use of alcohol entirely at this point. He agreed to this. I advised him that he should be working with his primary care doctor to try to get in to see a therapist and possibly a psychiatrist. I would also suggest a change to his medication. If he has been on Lexapro for months and is still feeling this bad something needs to change. I recommend tapering off the Lexapro.'s is a little complicated to explain at discharge but I am cutting his Lexapro dose down to 10 mg a day and advised him that he should stop it in about a week. Meanwhile I would like him to start on mirtazapine 30 mg at night. Prescription added and should be included in his medicines at discharge. Patient and daughter educated about major depression and the importance of being persistent with treatment. Advised that if he ever has actual thoughts about trying to hurt himself he needs to get help immediately. Patient and daughter agree to plan. Case reviewed with nursing.  Disposition: Patient does not meet criteria for psychiatric inpatient admission. Supportive therapy provided about ongoing stressors. Discussed crisis plan, support from social network, calling 911, coming to the Emergency Department, and calling Suicide Hotline.  Alethia Berthold, MD 06/01/2017 6:12 PM

## 2017-06-02 LAB — GLUCOSE, CAPILLARY
GLUCOSE-CAPILLARY: 103 mg/dL — AB (ref 65–99)
GLUCOSE-CAPILLARY: 110 mg/dL — AB (ref 65–99)

## 2017-06-02 LAB — HEPATIC FUNCTION PANEL
ALBUMIN: 3.4 g/dL — AB (ref 3.5–5.0)
ALK PHOS: 44 U/L (ref 38–126)
ALT: 9 U/L — ABNORMAL LOW (ref 17–63)
AST: 18 U/L (ref 15–41)
BILIRUBIN TOTAL: 1.4 mg/dL — AB (ref 0.3–1.2)
Bilirubin, Direct: 0.2 mg/dL (ref 0.1–0.5)
Indirect Bilirubin: 1.2 mg/dL — ABNORMAL HIGH (ref 0.3–0.9)
TOTAL PROTEIN: 5.9 g/dL — AB (ref 6.5–8.1)

## 2017-06-02 MED ORDER — MIRTAZAPINE 30 MG PO TABS: 30.0000 mg | ORAL_TABLET | Freq: Every day | ORAL | 0 refills | Status: DC

## 2017-06-02 MED ORDER — ESCITALOPRAM OXALATE 10 MG PO TABS: 10.0000 mg | ORAL_TABLET | Freq: Every day | ORAL | 0 refills | Status: DC

## 2017-06-02 NOTE — Discharge Summary (Signed)
[name] NAME: Kenneth Franklin    MR#:  [id number]  DATE OF BIRTH:  08-Aug-1946  DATE OF ADMISSION:  05/31/2017 ADMITTING PHYSICIAN: Harvie Bridge, DO  DATE OF DISCHARGE: 06/02/17  PRIMARY CARE PHYSICIAN: Roselee Nova, MD    ADMISSION DIAGNOSIS:  Suicidal ideation [R45.851] Involuntary commitment [Z04.6] Tylenol overdose, accidental or unintentional, initial encounter [T39.1X1A]  DISCHARGE DIAGNOSIS:  Acute ETOH intoxication and Suicidal ideation  Major depression ---cleared to go home Tylenol overdose ?unclear SECONDARY DIAGNOSIS:   Past Medical History:  Diagnosis Date  . Anxiety   . Depression   . Diabetes mellitus without complication (Kenneth Franklin)   . Hyperlipidemia   . Hypertension     HOSPITAL COURSE:   71 y.o.malewith a history of CVA, depression, diabetes, GERD, hypertensionnow being admitted with:  #. Acetaminophen overdose ? Pt denies any over use -recieved oral NAC---tylenol level <10 today. D/c NAC -Poison control was informed yday  #. Alcohol intoxication -Monitor for signs of alcohol withdrawal. -CIWA scoring 1           #. Suicidal ideation/majordepression -1-1 sitter d/ced -Involuntary commitment d/ced -Psych consult appreciated. Tapering lexapro to off and added remeron -ok to go home  #. History of CVA - Continue Plavix  #. History of hypertension - Continue Cozaar  #. History of GERD - Continue Zantac  #. History of H/o Diabetes - Accuchecks achs with RISS coverage - Heart healthy, carb controlled diet -resumed metformin  Medically stable for d/c today CONSULTS OBTAINED:  Treatment Team:  Clapacs, Madie Reno, MD  DRUG ALLERGIES:   Allergies  Allergen Reactions  . Propoxyphene     DISCHARGE MEDICATIONS:   Current Discharge Medication List    START taking these medications   Details  mirtazapine (REMERON) 30 MG tablet Take 1 tablet (30 mg total) by mouth  at bedtime. Qty: 30 tablet, Refills: 0      CONTINUE these medications which have CHANGED   Details  escitalopram (LEXAPRO) 10 MG tablet Take 1 tablet (10 mg total) by mouth daily. Stop your lexapro in a week Qty: 6 tablet, Refills: 0   Associated Diagnoses: Anxiety and depression      CONTINUE these medications which have NOT CHANGED   Details  ALPRAZolam (XANAX) 0.25 MG tablet TAKE 1 TABLET EVERY DAY AS NEEDED FOR ANXIETY Qty: 30 tablet, Refills: 2   Associated Diagnoses: Anxiety and depression    atorvastatin (LIPITOR) 80 MG tablet TAKE 1 TABLET (80 MG TOTAL) BY MOUTH NIGHTLY. Qty: 90 tablet, Refills: 0   Associated Diagnoses: Acute ischemic stroke (HCC)    busPIRone (BUSPAR) 10 MG tablet TAKE 1 TABLET (10 MG TOTAL) BY MOUTH 2 (TWO) TIMES DAILY. Qty: 180 tablet, Refills: 0   Associated Diagnoses: Anxiety and depression    Cholecalciferol 1000 UNITS capsule Take by mouth.    clopidogrel (PLAVIX) 75 MG tablet TAKE 1 TABLET (75 MG TOTAL) BY MOUTH DAILY. Qty: 90 tablet, Refills: 0   Associated Diagnoses: Acute ischemic stroke (HCC)    losartan (COZAAR) 100 MG tablet TAKE 1 TABLET (100 MG TOTAL) BY MOUTH DAILY. Qty: 90 tablet, Refills: 0   Associated Diagnoses: Essential hypertension    metFORMIN (GLUCOPHAGE) 500 MG tablet Take 1 tablet (500 mg total) by mouth daily with breakfast. Qty: 90 tablet, Refills: 0   Associated Diagnoses: Controlled type 2 diabetes mellitus without complication, without long-term current use of insulin (HCC)    omeprazole (PRILOSEC) 20 MG capsule  Take 20 mg by mouth daily.    ranitidine (ZANTAC) 300 MG tablet TAKE 1 TABLET (300 MG TOTAL) BY MOUTH AT BEDTIME. Qty: 90 tablet, Refills: 0   Associated Diagnoses: Gastroesophageal reflux disease, esophagitis presence not specified    sertraline (ZOLOFT) 25 MG tablet TAKE 1 TABLET (25 MG TOTAL) BY MOUTH AT BEDTIME. Qty: 30 tablet, Refills: 2   Associated Diagnoses: Grief counseling        If you  experience worsening of your admission symptoms, develop shortness of breath, life threatening emergency, suicidal or homicidal thoughts you must seek medical attention immediately by calling 911 or calling your MD immediately  if symptoms less severe.  You Must read complete instructions/literature along with all the possible adverse reactions/side effects for all the Medicines you take and that have been prescribed to you. Take any new Medicines after you have completely understood and accept all the possible adverse reactions/side effects.   Please note  You were cared for by a hospitalist during your hospital stay. If you have any questions about your discharge medications or the care you received while you were in the hospital after you are discharged, you can call the unit and asked to speak with the hospitalist on call if the hospitalist that took care of you is not available. Once you are discharged, your primary care physician will handle any further medical issues. Please note that NO REFILLS for any discharge medications will be authorized once you are discharged, as it is imperative that you return to your primary care physician (or establish a relationship with a primary care physician if you do not have one) for your aftercare needs so that they can reassess your need for medications and monitor your lab values. Today   SUBJECTIVE    Doing well. No suicidal thoughts. Ready to go home VITAL SIGNS:  Blood pressure (!) 176/88, pulse 65, temperature 98.1 F (36.7 C), temperature source Oral, resp. rate 18, height 6\' 1"  (1.854 m), weight 72.6 kg (160 lb), SpO2 96 %.  I/O:   Intake/Output Summary (Last 24 hours) at 06/02/17 1213 Last data filed at 06/02/17 0937  Gross per 24 hour  Intake              720 ml  Output                0 ml  Net              720 ml    PHYSICAL EXAMINATION:  GENERAL:  71 y.o.-year-old patient lying in the bed with no acute distress.  EYES: Pupils equal,  round, reactive to light and accommodation. No scleral icterus. Extraocular muscles intact.  HEENT: Head atraumatic, normocephalic. Oropharynx and nasopharynx clear.  NECK:  Supple, no jugular venous distention. No thyroid enlargement, no tenderness.  LUNGS: Normal breath sounds bilaterally, no wheezing, rales,rhonchi or crepitation. No use of accessory muscles of respiration.  CARDIOVASCULAR: S1, S2 normal. No murmurs, rubs, or gallops.  ABDOMEN: Soft, non-tender, non-distended. Bowel sounds present. No organomegaly or mass.  EXTREMITIES: No pedal edema, cyanosis, or clubbing.  NEUROLOGIC: Cranial nerves II through XII are intact. Muscle strength 5/5 in all extremities. Sensation intact. Gait not checked.  PSYCHIATRIC: The patient is alert and oriented x 3.  SKIN: No obvious rash, lesion, or ulcer.   DATA REVIEW:   CBC   Recent Labs Lab 06/01/17 0327  WBC 7.9  HGB 14.9  HCT 43.9  PLT 198    Chemistries  Recent Labs Lab 06/01/17 0327 06/02/17 0341  NA 140  --   K 4.2  --   CL 103  --   CO2 29  --   GLUCOSE 131*  --   BUN 9  --   CREATININE 0.94  --   CALCIUM 8.2*  --   AST 19 18  ALT 10* 9*  ALKPHOS 53 44  BILITOT 1.2 1.4*    Microbiology Results   No results found for this or any previous visit (from the past 240 hour(s)).  RADIOLOGY:  Ct Head Wo Contrast  Result Date: 05/31/2017 CLINICAL DATA:  Pt arrived via EMS for pt reports he fell last night and his friend thought he needed to get some help because he has not eaten in 3 days - he has drank red wine mostly for three days - he denies any pain or injury from the fall, hx of stroke EXAM: CT HEAD WITHOUT CONTRAST TECHNIQUE: Contiguous axial images were obtained from the base of the skull through the vertex without intravenous contrast. COMPARISON:  None. FINDINGS: Brain: There is central and cortical atrophy. Periventricular white matter changes are consistent with small vessel disease. There is no intra or  extra-axial fluid collection or mass lesion. The basilar cisterns and ventricles have a normal appearance. There is no CT evidence for acute infarction or hemorrhage. Vascular: There is atherosclerotic calcification of the internal carotid arteries. Skull: Normal. Negative for fracture or focal lesion. Sinuses/Orbits: No acute finding. Other: None IMPRESSION: 1.  No evidence for acute intracranial abnormality. 2. Atrophy and small vessel disease. Electronically Signed   By: Nolon Nations M.D.   On: 05/31/2017 18:56     Management plans discussed with the patient, family and they are in agreement.  CODE STATUS:     Code Status Orders        Start     Ordered   05/31/17 2334  Full code  Continuous     05/31/17 2333    Code Status History    Date Active Date Inactive Code Status Order ID Comments User Context   This patient has a current code status but no historical code status.      TOTAL TIME TAKING CARE OF THIS PATIENT: 40 minutes.    Talaysia Pinheiro M.D on 06/02/2017 at 12:13 PM  Between 7am to 6pm - Pager - [phone] After 6pm go to [email or web address] - password [email or web address]  Office  [phone]  CC: Primary care physician; Roselee Nova, MD

## 2017-06-02 NOTE — Progress Notes (Signed)
Patient alert and oriented. Denies pain or discomfort. Lung and heart sounds normal. Patient awaiting discharge.   Deri Fuelling, RN

## 2017-06-02 NOTE — Progress Notes (Signed)
Starlyn Skeans to be D/C'd Home per MD order.  Discussed with the patient and all questions fully answered.  VSS, Skin clean, dry and intact without evidence of skin break down, no evidence of skin tears noted. IV catheter discontinued intact. Site without signs and symptoms of complications. Dressing and pressure applied.  An After Visit Summary was printed and given to the patient. Patient received prescription.  D/c education completed with patient/family including follow up instructions, medication list, d/c activities limitations if indicated, with other d/c instructions as indicated by MD - patient able to verbalize understanding, all questions fully answered.   Patient instructed to return to ED, call 911, or call MD for any changes in condition.   Patient escorted via [place], and D/C home via private auto.  Deri Fuelling 06/02/2017 1:39 PM

## 2017-06-29 ENCOUNTER — Encounter: Payer: Self-pay | Admitting: Emergency Medicine

## 2017-06-29 ENCOUNTER — Emergency Department
Admission: EM | Admit: 2017-06-29 | Discharge: 2017-06-29 | Disposition: A | Discharge status: Home / Self Care | Payer: BLUE CROSS/BLUE SHIELD | Attending: Emergency Medicine | Admitting: Emergency Medicine

## 2017-06-29 DIAGNOSIS — I10 Essential (primary) hypertension: Secondary | ICD-10-CM | POA: Insufficient documentation

## 2017-06-29 DIAGNOSIS — E119 Type 2 diabetes mellitus without complications: Secondary | ICD-10-CM | POA: Diagnosis not present

## 2017-06-29 DIAGNOSIS — Z79899 Other long term (current) drug therapy: Secondary | ICD-10-CM | POA: Diagnosis not present

## 2017-06-29 DIAGNOSIS — K5900 Constipation, unspecified: Secondary | ICD-10-CM | POA: Diagnosis present

## 2017-06-29 DIAGNOSIS — Z7984 Long term (current) use of oral hypoglycemic drugs: Secondary | ICD-10-CM | POA: Diagnosis not present

## 2017-06-29 DIAGNOSIS — K5641 Fecal impaction: Secondary | ICD-10-CM | POA: Insufficient documentation

## 2017-06-29 DIAGNOSIS — F17290 Nicotine dependence, other tobacco product, uncomplicated: Secondary | ICD-10-CM | POA: Diagnosis not present

## 2017-06-29 NOTE — ED Notes (Signed)
ED Provider at bedside. 

## 2017-06-29 NOTE — ED Provider Notes (Signed)
[facility] Emergency Department Provider Note  ____________________________________________   First MD Initiated Contact with Patient 06/29/17 1059     (approximate)  I have reviewed the triage vital signs and the nursing notes.   HISTORY  Chief Complaint rectal pain and Constipation   HPI Kenneth Franklin is a 71 y.o. male history of anxiety and depression was recently started on Remeron in late July who is presenting to the emergency department today with 3 days of constipation. He says that he is having severe pain now to his rectum and feels like there is something there that he just can't push out. He says that he is able to urinate. Has not had any vomiting. Says that he drinks a minimal amount of water or anything minimal amount of fruits and vegetables.   Past Medical History:  Diagnosis Date  . Anxiety   . Depression   . Diabetes mellitus without complication ([place])   . Hyperlipidemia   . Hypertension     Patient Active Problem List   Diagnosis Date Noted  . Severe recurrent major depression without psychotic features ([place]) 06/01/2017  . Alcohol abuse 06/01/2017  . Tylenol overdose 05/31/2017  . Erroneous encounter - disregard 08/02/2016  . Acute ischemic stroke ([place]) 07/07/2016  . Tick bite of flank 06/16/2016  . Grief counseling 06/16/2016  . Fatigue 06/16/2016  . Absent pulse in lower extremity 09/14/2015  . Anxiety and depression 04/21/2015  . Diabetes mellitus type 2, controlled, without complications ([place]) 85/88/5027  . Acid reflux 04/21/2015  . Calcium blood increased 04/21/2015  . Dyslipidemia 04/21/2015  . HLD (hyperlipidemia) 04/21/2015  . BP (high blood pressure) 04/21/2015    Past Surgical History:  Procedure Laterality Date  . CATARACT EXTRACTION Bilateral     Prior to Admission medications   Medication Sig Start Date End Date Taking? Authorizing Provider  ALPRAZolam Duanne Moron) 0.25 MG tablet TAKE 1 TABLET EVERY DAY  AS NEEDED FOR ANXIETY 10/26/16   Roselee Nova, MD  atorvastatin (LIPITOR) 80 MG tablet TAKE 1 TABLET (80 MG TOTAL) BY MOUTH NIGHTLY. 11/14/16   Roselee Nova, MD  busPIRone (BUSPAR) 10 MG tablet TAKE 1 TABLET (10 MG TOTAL) BY MOUTH 2 (TWO) TIMES DAILY. 08/16/16   Roselee Nova, MD  Cholecalciferol 1000 UNITS capsule Take by mouth.    [provider]  clopidogrel (PLAVIX) 75 MG tablet TAKE 1 TABLET (75 MG TOTAL) BY MOUTH DAILY. 07/25/16   Roselee Nova, MD  escitalopram (LEXAPRO) 10 MG tablet Take 1 tablet (10 mg total) by mouth daily. Stop your lexapro in a week 06/02/17 06/08/17  Fritzi Mandes, MD  losartan (COZAAR) 100 MG tablet TAKE 1 TABLET (100 MG TOTAL) BY MOUTH DAILY. 08/16/16   Roselee Nova, MD  metFORMIN (GLUCOPHAGE) 500 MG tablet Take 1 tablet (500 mg total) by mouth daily with breakfast. 05/18/16   Roselee Nova, MD  mirtazapine (REMERON) 30 MG tablet Take 1 tablet (30 mg total) by mouth at bedtime. 06/02/17   Fritzi Mandes, MD  omeprazole (PRILOSEC) 20 MG capsule Take 20 mg by mouth daily.    [provider]  ranitidine (ZANTAC) 300 MG tablet TAKE 1 TABLET (300 MG TOTAL) BY MOUTH AT BEDTIME. Patient not taking: Reported on 06/01/2017 08/16/16   Roselee Nova, MD  sertraline (ZOLOFT) 25 MG tablet TAKE 1 TABLET (25 MG TOTAL) BY MOUTH AT BEDTIME. Patient not taking: Reported on 06/01/2017 12/19/16  Roselee Nova, MD    Allergies Propoxyphene  Family History  Problem Relation Age of Onset  . Diabetes Brother     Social History Social History  Substance Use Topics  . Smoking status: Current Some Day Smoker    Types: Cigars  . Smokeless tobacco: Never Used  . Alcohol use Yes     Comment: daily wine - several a day    Review of Systems  Constitutional: No fever/chills Eyes: No visual changes. ENT: No sore throat. Cardiovascular: Denies chest pain. Respiratory: Denies shortness of breath. Gastrointestinal: No abdominal pain.  No nausea,  no vomiting.  No diarrhea.   Genitourinary: Negative for dysuria. Musculoskeletal: Negative for back pain. Skin: Negative for rash. Neurological: Negative for headaches, focal weakness or numbness.   ____________________________________________   PHYSICAL EXAM:  VITAL SIGNS: ED Triage Vitals [06/29/17 1048]  Enc Vitals Group     BP (!) 157/83     Pulse Rate (!) 107     Resp 18     Temp 98.1 F (36.7 C)     Temp Source Oral     SpO2 97 %     Weight 163 lb (73.9 kg)     Height 6\' 1"  (1.854 m)     Head Circumference      Peak Flow      Pain Score 10     Pain Loc      Pain Edu?      Excl. in [place]?     Constitutional: Alert and oriented. Appears uncomfortable and is bent over walking to the room. Eyes: Conjunctivae are normal.  Head: Atraumatic. Nose: No congestion/rhinnorhea. Mouth/Throat: Mucous membranes are moist.  Neck: No stridor.   Cardiovascular: Normal rate, regular rhythm. Grossly normal heart sounds.   Respiratory: Normal respiratory effort.  No retractions. Lungs CTAB. Gastrointestinal: Soft and nontender. No distention.  Musculoskeletal: No lower extremity tenderness nor edema.  No joint effusions. Neurologic:  Normal speech and language. No gross focal neurologic deficits are appreciated. Skin:  Skin is warm, dry and intact. No rash noted. Psychiatric: Mood and affect are normal. Speech and behavior are normal.  ____________________________________________   LABS (all labs ordered are listed, but only abnormal results are displayed)  Labs Reviewed - No data to display ____________________________________________  EKG   ____________________________________________  RADIOLOGY   ____________________________________________   PROCEDURES  Procedure(s) performed:  ------------------------------------------------------------------------------------------------------------------- Fecal Disimpaction Procedure Note:  Performed by me:  Patient  placed in the lateral recumbent position with knees drawn towards chest. Nurse present for patient support. Large amount of hard brown stool removed. No complications during procedure.   ------------------------------------------------------------------------------------------------------------------    Procedures  Critical Care performed:   ____________________________________________   INITIAL IMPRESSION / ASSESSMENT AND PLAN / ED COURSE  Pertinent labs & imaging results that were available during my care of the patient were reviewed by me and considered in my medical decision making (see chart for details).  ----------------------------------------- 11:33 AM on 06/29/2017 -----------------------------------------  I was able to disimpact a large amount of stool and the patient was able to move a large bowel movement and is now a symptomatic. He says that he feels greatly improved. We discussed drinking plenty of fluids as well as increasing fiber intake in his diet such as using fiber one bar's or fiber cereals. He also noted to be present vegetables with a also help avoid this situation in the future. He will also be following up with his primary care doctor for possible adjustment  of his Remeron or be taken off Remeron as this may have been the cause of his constipation.      ____________________________________________   FINAL CLINICAL IMPRESSION(S) / ED DIAGNOSES  Fecal impaction. Constipation.    NEW MEDICATIONS STARTED DURING THIS VISIT:  New Prescriptions   No medications on file     Note:  This document was prepared using Dragon voice recognition software and may include unintentional dictation errors.     Orbie Pyo, MD 06/29/17 1134

## 2017-06-29 NOTE — ED Triage Notes (Signed)
Pt c/o pain to rectum.  Feels like needs to poop but it won't come out.  Pt appears uncomfortable.  Some pain in pelvic area as well. Has not had bowel movement in 2-3 days.  States "i have always been regular".  Reports they changed a medication recently and he is not sure if that is causing his constipation because never had this problem prior to change.  No nausea/vomiting.

## 2019-05-30 ENCOUNTER — Other Ambulatory Visit: Payer: Self-pay | Admitting: Gastroenterology

## 2019-05-30 DIAGNOSIS — K219 Gastro-esophageal reflux disease without esophagitis: Secondary | ICD-10-CM

## 2019-05-30 DIAGNOSIS — R1013 Epigastric pain: Secondary | ICD-10-CM

## 2019-05-30 DIAGNOSIS — R110 Nausea: Secondary | ICD-10-CM

## 2019-06-10 ENCOUNTER — Emergency Department: Payer: Medicare Other

## 2019-06-10 ENCOUNTER — Other Ambulatory Visit: Payer: Self-pay

## 2019-06-10 ENCOUNTER — Emergency Department
Admission: EM | Admit: 2019-06-10 | Discharge: 2019-06-10 | Disposition: A | Discharge status: Home / Self Care | Payer: Medicare Other | Attending: Emergency Medicine | Admitting: Emergency Medicine

## 2019-06-10 ENCOUNTER — Encounter: Payer: Self-pay | Admitting: Emergency Medicine

## 2019-06-10 DIAGNOSIS — Z79899 Other long term (current) drug therapy: Secondary | ICD-10-CM | POA: Diagnosis not present

## 2019-06-10 DIAGNOSIS — R112 Nausea with vomiting, unspecified: Secondary | ICD-10-CM | POA: Insufficient documentation

## 2019-06-10 DIAGNOSIS — R1011 Right upper quadrant pain: Secondary | ICD-10-CM

## 2019-06-10 DIAGNOSIS — F17210 Nicotine dependence, cigarettes, uncomplicated: Secondary | ICD-10-CM | POA: Insufficient documentation

## 2019-06-10 DIAGNOSIS — E119 Type 2 diabetes mellitus without complications: Secondary | ICD-10-CM | POA: Insufficient documentation

## 2019-06-10 DIAGNOSIS — R1110 Vomiting, unspecified: Secondary | ICD-10-CM | POA: Diagnosis present

## 2019-06-10 DIAGNOSIS — I10 Essential (primary) hypertension: Secondary | ICD-10-CM | POA: Diagnosis not present

## 2019-06-10 DIAGNOSIS — Z7984 Long term (current) use of oral hypoglycemic drugs: Secondary | ICD-10-CM | POA: Diagnosis not present

## 2019-06-10 LAB — COMPREHENSIVE METABOLIC PANEL
ALT: 38 U/L (ref 0–44)
AST: 47 U/L — ABNORMAL HIGH (ref 15–41)
Albumin: 4.1 g/dL (ref 3.5–5.0)
Alkaline Phosphatase: 120 U/L (ref 38–126)
Anion gap: 12 (ref 5–15)
BUN: 6 mg/dL — ABNORMAL LOW (ref 8–23)
CO2: 23 mmol/L (ref 22–32)
Calcium: 9.1 mg/dL (ref 8.9–10.3)
Chloride: 97 mmol/L — ABNORMAL LOW (ref 98–111)
Creatinine, Ser: 0.93 mg/dL (ref 0.61–1.24)
GFR calc Af Amer: >60 mL/min (ref >60)
GFR calc non Af Amer: >60 mL/min (ref >60)
Glucose, Bld: 166 mg/dL — ABNORMAL HIGH (ref 70–99)
Potassium: 4.9 mmol/L (ref 3.5–5.1)
Sodium: 132 mmol/L — ABNORMAL LOW (ref 135–145)
Total Bilirubin: 0.8 mg/dL (ref 0.3–1.2)
Total Protein: 7.6 g/dL (ref 6.5–8.1)

## 2019-06-10 LAB — CBC
HCT: 46.2 % (ref 39.0–52.0)
Hemoglobin: 15.2 g/dL (ref 13.0–17.0)
MCH: 28.5 pg (ref 26.0–34.0)
MCHC: 32.9 g/dL (ref 30.0–36.0)
MCV: 86.5 fL (ref 80.0–100.0)
Platelets: 228 10*3/uL (ref 150–400)
RBC: 5.34 MIL/uL (ref 4.22–5.81)
RDW: 13.4 % (ref 11.5–15.5)
WBC: 5.2 10*3/uL (ref 4.0–10.5)
nRBC: 0 % (ref 0.0–0.2)

## 2019-06-10 LAB — LIPASE, BLOOD: Lipase: 32 U/L (ref 11–51)

## 2019-06-10 MED ORDER — SODIUM CHLORIDE 0.9% FLUSH: 3.0000 mL | Freq: Once | INTRAVENOUS | Status: DC

## 2019-06-10 MED ORDER — ALUM & MAG HYDROXIDE-SIMETH 200-200-20 MG/5ML PO SUSP
30.0000 mL | Freq: Once | ORAL | Status: AC
  Administered 2019-06-10: 30 mL via ORAL
  Filled 2019-06-10: qty 30

## 2019-06-10 MED ORDER — ONDANSETRON 4 MG PO TBDP: 4.0000 mg | ORAL_TABLET | Freq: Three times a day (TID) | ORAL | 0 refills | Status: DC | PRN

## 2019-06-10 MED ORDER — LORAZEPAM 2 MG/ML IJ SOLN
0.5000 mg | Freq: Once | INTRAMUSCULAR | Status: AC
  Administered 2019-06-10: 0.5 mg via INTRAVENOUS
  Filled 2019-06-10: qty 1

## 2019-06-10 MED ORDER — SODIUM CHLORIDE 0.9 % IV BOLUS
1000.0000 mL | Freq: Once | INTRAVENOUS | Status: AC
  Administered 2019-06-10: 1000 mL via INTRAVENOUS

## 2019-06-10 MED ORDER — ONDANSETRON HCL 4 MG/2ML IJ SOLN
4.0000 mg | Freq: Once | INTRAMUSCULAR | Status: AC | PRN
  Administered 2019-06-10: 4 mg via INTRAVENOUS
  Filled 2019-06-10: qty 2

## 2019-06-10 MED ORDER — SUCRALFATE 1 G PO TABS: 1.0000 g | ORAL_TABLET | Freq: Four times a day (QID) | ORAL | 11 refills | Status: DC

## 2019-06-10 MED ORDER — METOCLOPRAMIDE HCL 5 MG/ML IJ SOLN
10.0000 mg | Freq: Once | INTRAMUSCULAR | Status: AC
  Administered 2019-06-10: 10 mg via INTRAVENOUS
  Filled 2019-06-10: qty 2

## 2019-06-10 MED ORDER — ONDANSETRON HCL 4 MG/2ML IJ SOLN
4.0000 mg | Freq: Once | INTRAMUSCULAR | Status: AC
  Administered 2019-06-10: 4 mg via INTRAVENOUS
  Filled 2019-06-10: qty 2

## 2019-06-10 MED ORDER — LIDOCAINE VISCOUS HCL 2 % MT SOLN
15.0000 mL | Freq: Once | OROMUCOSAL | Status: AC
  Administered 2019-06-10: 15 mL via ORAL
  Filled 2019-06-10: qty 15

## 2019-06-10 MED ORDER — DIPHENHYDRAMINE HCL 50 MG/ML IJ SOLN
12.5000 mg | Freq: Once | INTRAMUSCULAR | Status: AC
  Administered 2019-06-10: 12.5 mg via INTRAVENOUS
  Filled 2019-06-10: qty 1

## 2019-06-10 MED ORDER — LORAZEPAM 0.5 MG PO TABS: 0.5000 mg | ORAL_TABLET | Freq: Three times a day (TID) | ORAL | 0 refills | Status: AC | PRN

## 2019-06-10 NOTE — ED Notes (Signed)
Pt resting calmly in bed. Bed locked low. Call bell within reach.

## 2019-06-10 NOTE — ED Triage Notes (Signed)
C/O vomiting since 2008.  Has been seen by PCP and [facility] for same, and states symptoms not improving.

## 2019-06-10 NOTE — ED Provider Notes (Signed)
[facility] Emergency Department Provider Note  ____________________________________________   First MD Initiated Contact with Patient 06/10/19 1242     (approximate)  I have reviewed the triage vital signs and the nursing notes.   HISTORY  Chief Complaint Emesis    HPI Kenneth Franklin is a 73 y.o. male with past medical history of hypertension, diabetes, depression, here with nausea.  This is a chronic issue.  He states that it is been going on since he retired and his wife passed away.  He has seen multiple GI specialist for this and recently established with Dr. Alice Reichert.  He states that over the last several weeks, his nausea and vomiting has progressively worsened.  It is worse in the morning, and gradually improves as he begins to eat.  He has been on omeprazole, as well as Gaviscon.  This was recently increased at his recent GI appointment.  He is scheduled for an outpatient upper GI next week.  He states he is here because his nausea is worsening and he felt so weak he had difficulty getting out of bed this morning.  He states once he starts eating, he is able to eat and drink.  He does not actually have any antiemetics at home.  Denies any fevers or chills.  No persistent abdominal pain.  His nausea is improved and he is been able to walk around and eat today without difficulty.  No other complaints.        Past Medical History:  Diagnosis Date  . Anxiety   . Depression   . Diabetes mellitus without complication ([place])   . Hyperlipidemia   . Hypertension     Patient Active Problem List   Diagnosis Date Noted  . Severe recurrent major depression without psychotic features ([place]) 06/01/2017  . Alcohol abuse 06/01/2017  . Tylenol overdose 05/31/2017  . Erroneous encounter - disregard 08/02/2016  . Acute ischemic stroke ([place]) 07/07/2016  . Tick bite of flank 06/16/2016  . Grief counseling 06/16/2016  . Fatigue 06/16/2016  . Absent pulse in lower  extremity 09/14/2015  . Anxiety and depression 04/21/2015  . Diabetes mellitus type 2, controlled, without complications ([place]) 28/78/6767  . Acid reflux 04/21/2015  . Calcium blood increased 04/21/2015  . Dyslipidemia 04/21/2015  . HLD (hyperlipidemia) 04/21/2015  . BP (high blood pressure) 04/21/2015    Past Surgical History:  Procedure Laterality Date  . CATARACT EXTRACTION Bilateral     Prior to Admission medications   Medication Sig Start Date End Date Taking? Authorizing Provider  ALPRAZolam Duanne Moron) 0.25 MG tablet TAKE 1 TABLET EVERY DAY AS NEEDED FOR ANXIETY 10/26/16   Roselee Nova, MD  atorvastatin (LIPITOR) 80 MG tablet TAKE 1 TABLET (80 MG TOTAL) BY MOUTH NIGHTLY. 11/14/16   Roselee Nova, MD  busPIRone (BUSPAR) 10 MG tablet TAKE 1 TABLET (10 MG TOTAL) BY MOUTH 2 (TWO) TIMES DAILY. 08/16/16   Roselee Nova, MD  Cholecalciferol 1000 UNITS capsule Take by mouth.    [provider]  clopidogrel (PLAVIX) 75 MG tablet TAKE 1 TABLET (75 MG TOTAL) BY MOUTH DAILY. 07/25/16   Roselee Nova, MD  escitalopram (LEXAPRO) 10 MG tablet Take 1 tablet (10 mg total) by mouth daily. Stop your lexapro in a week 06/02/17 06/08/17  Fritzi Mandes, MD  LORazepam (ATIVAN) 0.5 MG tablet Take 1 tablet (0.5 mg total) by mouth every 8 (eight) hours as needed (nausea). 06/10/19 06/09/20  Duffy Bruce, MD  losartan (COZAAR) 100 MG tablet TAKE 1 TABLET (100 MG TOTAL) BY MOUTH DAILY. 08/16/16   Roselee Nova, MD  metFORMIN (GLUCOPHAGE) 500 MG tablet Take 1 tablet (500 mg total) by mouth daily with breakfast. 05/18/16   Roselee Nova, MD  mirtazapine (REMERON) 30 MG tablet Take 1 tablet (30 mg total) by mouth at bedtime. 06/02/17   Fritzi Mandes, MD  omeprazole (PRILOSEC) 20 MG capsule Take 20 mg by mouth daily.    [provider]  ondansetron (ZOFRAN ODT) 4 MG disintegrating tablet Take 1 tablet (4 mg total) by mouth every 8 (eight) hours as needed for nausea or vomiting. 06/10/19    Duffy Bruce, MD  ranitidine (ZANTAC) 300 MG tablet TAKE 1 TABLET (300 MG TOTAL) BY MOUTH AT BEDTIME. Patient not taking: Reported on 06/01/2017 08/16/16   Roselee Nova, MD  sertraline (ZOLOFT) 25 MG tablet TAKE 1 TABLET (25 MG TOTAL) BY MOUTH AT BEDTIME. Patient not taking: Reported on 06/01/2017 12/19/16   Roselee Nova, MD  sucralfate (CARAFATE) 1 g tablet Take 1 tablet (1 g total) by mouth 4 (four) times daily. 06/10/19 06/09/20  Duffy Bruce, MD    Allergies Propoxyphene  Family History  Problem Relation Age of Onset  . Diabetes Brother     Social History Social History   Tobacco Use  . Smoking status: Current Some Day Smoker    Types: Cigars  . Smokeless tobacco: Never Used  Substance Use Topics  . Alcohol use: Yes    Comment: daily wine - several a day  . Drug use: No    Review of Systems  Review of Systems  Constitutional: Positive for fatigue. Negative for chills and fever.  HENT: Negative for sore throat.   Respiratory: Negative for shortness of breath.   Cardiovascular: Negative for chest pain.  Gastrointestinal: Positive for nausea. Negative for abdominal pain.  Genitourinary: Negative for flank pain.  Musculoskeletal: Negative for neck pain.  Skin: Negative for rash and wound.  Allergic/Immunologic: Negative for immunocompromised state.  Neurological: Positive for weakness. Negative for numbness.  Hematological: Does not bruise/bleed easily.  All other systems reviewed and are negative.    ____________________________________________  PHYSICAL EXAM:      VITAL SIGNS: ED Triage Vitals  Enc Vitals Group     BP 06/10/19 1024 133/80     Pulse Rate 06/10/19 1024 (!) 102     Resp 06/10/19 1024 17     Temp 06/10/19 1024 98.6 F (37 C)     Temp Source 06/10/19 1024 Oral     SpO2 06/10/19 1024 97 %     Weight 06/10/19 1024 180 lb (81.6 kg)     Height 06/10/19 1024 6' (1.829 m)     Head Circumference --      Peak Flow --      Pain Score  06/10/19 1029 0     Pain Loc --      Pain Edu? --      Excl. in [place]? --      Physical Exam Vitals signs and nursing note reviewed.  Constitutional:      General: He is not in acute distress.    Appearance: He is well-developed.  HENT:     Head: Normocephalic and atraumatic.  Eyes:     Conjunctiva/sclera: Conjunctivae normal.  Neck:     Musculoskeletal: Neck supple.  Cardiovascular:     Rate and Rhythm: Normal rate and regular rhythm.  Heart sounds: Normal heart sounds. No murmur. No friction rub.  Pulmonary:     Effort: Pulmonary effort is normal. No respiratory distress.     Breath sounds: Normal breath sounds. No wheezing or rales.  Abdominal:     General: There is no distension.     Palpations: Abdomen is soft.     Tenderness: There is abdominal tenderness (Minimal epigastric and right upper quadrant tenderness, no rebound or guarding).  Skin:    General: Skin is warm.     Capillary Refill: Capillary refill takes less than 2 seconds.  Neurological:     Mental Status: He is alert and oriented to person, place, and time.     Motor: No abnormal muscle tone.       ____________________________________________   LABS (all labs ordered are listed, but only abnormal results are displayed)  Labs Reviewed  COMPREHENSIVE METABOLIC PANEL - Abnormal; Notable for the following components:      Result Value   Sodium 132 (*)    Chloride 97 (*)    Glucose, Bld 166 (*)    BUN 6 (*)    AST 47 (*)    All other components within normal limits  LIPASE, BLOOD  CBC  URINALYSIS, COMPLETE (UACMP) WITH MICROSCOPIC    ____________________________________________  EKG: Normal sinus rhythm, ventricular rate 76.  PR 168, QRS 92, QTc 421.  No acute ischemic changes. ________________________________________  RADIOLOGY All imaging, including plain films, CT scans, and ultrasounds, independently reviewed by me, and interpretations confirmed via formal radiology reads.  ED MD  interpretation:   Ultrasound: No cholelithiasis, no cholecystitis, normal.  Possible hepatic steatosis.  Official radiology report(s): US Abdomen Limited Ruq  Result Date: 06/10/2019 CLINICAL DATA:  Right upper quadrant pain for 1 day EXAM: ULTRASOUND ABDOMEN LIMITED RIGHT UPPER QUADRANT COMPARISON:  None. FINDINGS: Gallbladder: No gallstones or wall thickening visualized. No sonographic Murphy sign noted by sonographer. Common bile duct: Diameter: 3.3 mm Liver: No focal lesion identified. Increased hepatic parenchymal echogenicity. Portal vein is patent on color Doppler imaging with normal direction of blood flow towards the liver. IMPRESSION: No cholelithiasis or sonographic evidence of acute cholecystitis. Increased hepatic echogenicity as can be seen with hepatic steatosis. Electronically Signed   By: Kathreen Devoid   On: 06/10/2019 13:37    ____________________________________________  PROCEDURES   Procedure(s) performed (including Critical Care):  Procedures  ____________________________________________  INITIAL IMPRESSION / MDM / [place] / ED COURSE  As part of my medical decision making, I reviewed the following data within the electronic MEDICAL RECORD NUMBER Notes from prior ED visits and [place] Controlled Substance Database      *JESIAH GRISMER was evaluated in Emergency Department on 06/10/2019 for the symptoms described in the history of present illness. He was evaluated in the context of the global COVID-19 pandemic, which necessitated consideration that the patient might be at risk for infection with the SARS-CoV-2 virus that causes COVID-19. Institutional protocols and algorithms that pertain to the evaluation of patients at risk for COVID-19 are in a state of rapid change based on information released by regulatory bodies including the CDC and federal and state organizations. These policies and algorithms were followed during the patient's care in the ED.  Some ED  evaluations and interventions may be delayed as a result of limited staffing during the pandemic.*   Clinical Course as of Jun 09 1600  Mon Jun 10, 2019  1344 73 yo M here with ongoing, recurrent n/v, worse in  AM. Suspect GERD/gastritis/PUD, duodenal ulcer. No signs of cholecystitis, hepatitis or pancreatitis. Sx improve w/ eating. Abdomen is o/w soft, NT, ND, with no leukocytosis or sx to suggest intra-abd emergency or infection. He's been worked up extensively for this. Prior CTs unremarkable. Will have him add Carafate to his regimen, particularly taking this nightly, along with zofran for his AM nausea. Return precautions given. Pt updated and in agreement.   [CI]  1600 Patient feels markedly improved after Ativan actually here.  Reviewed his prior records and he has not tried this for nausea.  In addition to the Zofran and Carafate, will add this as there could also be component of anxiety.  Patient in agreement.  Is tolerating p.o.  No apparent acute emergent pathology.  Follow-up with GI.   [CI]    Clinical Course User Index [CI] Duffy Bruce, MD    Medical Decision Making: As above  ____________________________________________  FINAL CLINICAL IMPRESSION(S) / ED DIAGNOSES  Final diagnoses:  RUQ pain  Non-intractable vomiting with nausea, unspecified vomiting type     MEDICATIONS GIVEN DURING THIS VISIT:  Medications  sodium chloride flush (NS) 0.9 % injection 3 mL (has no administration in time range)  ondansetron (ZOFRAN) injection 4 mg (4 mg Intravenous Given 06/10/19 1038)  metoCLOPramide (REGLAN) injection 10 mg (10 mg Intravenous Given 06/10/19 1311)  diphenhydrAMINE (BENADRYL) injection 12.5 mg (12.5 mg Intravenous Given 06/10/19 1313)  sodium chloride 0.9 % bolus 1,000 mL (1,000 mLs Intravenous New Bag/Given 06/10/19 1309)  alum & mag hydroxide-simeth (MAALOX/MYLANTA) 200-200-20 MG/5ML suspension 30 mL (30 mLs Oral Given 06/10/19 1310)    And  lidocaine (XYLOCAINE) 2 %  viscous mouth solution 15 mL (15 mLs Oral Given 06/10/19 1310)  ondansetron (ZOFRAN) injection 4 mg (4 mg Intravenous Given 06/10/19 1356)  LORazepam (ATIVAN) injection 0.5 mg (0.5 mg Intravenous Given 06/10/19 1401)     ED Discharge Orders         Ordered    ondansetron (ZOFRAN ODT) 4 MG disintegrating tablet  Every 8 hours PRN     06/10/19 1347    sucralfate (CARAFATE) 1 g tablet  4 times daily     06/10/19 1347    LORazepam (ATIVAN) 0.5 MG tablet  Every 8 hours PRN     06/10/19 1446           Note:  This document was prepared using Dragon voice recognition software and may include unintentional dictation errors.   Duffy Bruce, MD 06/10/19 1601

## 2019-06-10 NOTE — Discharge Instructions (Addendum)
At night, in addition to the Gaviscon that was started, I recommend taking 1 0.5 mg tablet of Ativan, as well as 1 Zofran tablet.  These should help with nausea when you wake up.  You should also eat a small, bland snack immediately upon awakening to help with your nausea.  Continue the antacid and Gaviscon.  I have also started Carafate which could help.  If it does not seem to help, you do not have to continue taking this.

## 2019-06-20 ENCOUNTER — Ambulatory Visit
Admission: RE | Admit: 2019-06-20 | Discharge: 2019-06-20 | Disposition: A | Discharge status: Home / Self Care | Payer: Medicare Other | Source: Ambulatory Visit | Attending: Gastroenterology | Admitting: Gastroenterology

## 2019-06-20 ENCOUNTER — Other Ambulatory Visit: Payer: Self-pay

## 2019-06-20 DIAGNOSIS — R1013 Epigastric pain: Secondary | ICD-10-CM | POA: Diagnosis present

## 2019-06-20 DIAGNOSIS — R110 Nausea: Secondary | ICD-10-CM | POA: Diagnosis not present

## 2019-06-20 DIAGNOSIS — K219 Gastro-esophageal reflux disease without esophagitis: Secondary | ICD-10-CM | POA: Diagnosis present

## 2019-08-12 DIAGNOSIS — Z87891 Personal history of nicotine dependence: Secondary | ICD-10-CM | POA: Insufficient documentation

## 2019-10-01 DIAGNOSIS — J441 Chronic obstructive pulmonary disease with (acute) exacerbation: Secondary | ICD-10-CM | POA: Insufficient documentation

## 2020-08-10 ENCOUNTER — Other Ambulatory Visit: Payer: Medicare Other

## 2020-08-10 ENCOUNTER — Other Ambulatory Visit: Payer: Self-pay

## 2020-08-10 ENCOUNTER — Other Ambulatory Visit
Admission: RE | Admit: 2020-08-10 | Discharge: 2020-08-10 | Disposition: A | Discharge status: Home / Self Care | Payer: Medicare Other | Source: Ambulatory Visit | Attending: Otolaryngology | Admitting: Otolaryngology

## 2020-08-10 DIAGNOSIS — Z01812 Encounter for preprocedural laboratory examination: Secondary | ICD-10-CM | POA: Diagnosis present

## 2020-08-10 DIAGNOSIS — Z20822 Contact with and (suspected) exposure to covid-19: Secondary | ICD-10-CM | POA: Diagnosis not present

## 2020-08-11 ENCOUNTER — Other Ambulatory Visit: Payer: Medicare Other

## 2020-08-11 ENCOUNTER — Encounter
Admission: RE | Admit: 2020-08-11 | Discharge: 2020-08-11 | Disposition: A | Discharge status: Home / Self Care | Payer: Medicare Other | Source: Ambulatory Visit | Attending: Otolaryngology | Admitting: Otolaryngology

## 2020-08-11 HISTORY — DX: Dyspnea, unspecified: R06.00

## 2020-08-11 HISTORY — DX: Malignant (primary) neoplasm, unspecified: C80.1

## 2020-08-11 HISTORY — DX: Alcohol abuse, uncomplicated: F10.10

## 2020-08-11 LAB — SARS CORONAVIRUS 2 (TAT 6-24 HRS): SARS Coronavirus 2: NEGATIVE

## 2020-08-11 NOTE — Progress Notes (Signed)
  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  Procedure is scheduled for 08/12/2020 and is to be performed by Dr. Kathyrn Sheriff.  During the patient's PAT interview it was discovered the patient is on DAPT therapy following an ischemic CVA the patient suffered in 2017.  Patient advising that surgeons office did not provide any instructions on holding DAPT therapy prior to procedure.  Patient took it upon himself to hold daily ASA starting on 08/09/2020.  He last took his daily dose of clopidogrel on 08/10/2020.  Call placed to [facility] to discuss patient being on DAPT therapy and whether or not it is feasible to proceed with case as scheduled on 08/12/2020.  Primary ENT surgeon was consulted by clinic staff.  Per Dr. Kathyrn Sheriff, patient has contact the office following PAT interview today.  He was advised to hold DAPT therapy until advised further.  Surgeon plans to proceed with nasopharyngoscopy and nasopharyngeal biopsy as already scheduled.  In review of patient's past medical history it is noted that patient is diabetic.  Last hemoglobin A1c was 11.2 on 05/13/2020.  Patient has not had recent basic labs or a current ECG.  Orders have been placed for a twelve-lead ECG, CBC, and BMP to be performed by [facility] staff prior to patient undergoing procedure.  The aforementioned testing will need to be reviewed by anesthesia team prior to proceeding with anesthetic course for planned surgical procedure.  Honor Loh, MSN, APRN, FNP-C, CEN [facility]  Peri-operative Services Nurse Practitioner Phone: [phone] 08/11/20 11:58 AM

## 2020-08-11 NOTE — Patient Instructions (Addendum)
Your procedure is scheduled on: 08-12-20 [place] Report to Same Day Surgery 2nd floor medical mall [facility] Entrance-take elevator on left to 2nd floor.  Check in with surgery information desk.) To find out your arrival time please call [phone] between 1PM - 3PM on 08-11-20 TUESDAY  Remember: Instructions that are not followed completely may result in serious medical risk, up to and including death, or upon the discretion of your surgeon and anesthesiologist your surgery may need to be rescheduled.    _x___ 1. Do not eat food after midnight the night before your procedure. NO GUM OR CANDY AFTER MIDNIGHT. You may drink WATER up to 2 hours before you are scheduled to arrive at the hospital for your procedure.  Do not drink WATER within 2 hours of your scheduled arrival to the hospital.  Type 1 and type 2 diabetics should only drink water     __x__ 2. No Alcohol for 24 hours before or after surgery.   __x__3. No Smoking or e-cigarettes for 24 prior to surgery.  Do not use any chewable tobacco products for at least 6 hour prior to surgery   ____  4. Bring all medications with you on the day of surgery if instructed.    __x__ 5. Notify your doctor if there is any change in your medical condition     (cold, fever, infections).    x___6. On the morning of surgery brush your teeth with toothpaste and water.  You may rinse your mouth with mouth wash if you wish.  Do not swallow any toothpaste or mouthwash.   Do not wear jewelry, make-up, hairpins, clips or nail polish.  Do not wear lotions, powders, or perfumes. You may wear deodorant.  Do not shave 48 hours prior to surgery. Men may shave face and neck.  Do not bring valuables to the hospital.    [facility] is not responsible for any belongings or valuables.               Contacts, dentures or bridgework may not be worn into surgery.  Leave your suitcase in the car. After surgery it may be brought to your room.  For patients  admitted to the hospital, discharge time is determined by your treatment team.  _  Patients discharged the day of surgery will not be allowed to drive home.  You will need someone to drive you home and stay with you the night of your procedure.    Please read over the following fact sheets that you were given:   [facility] Preparing for Surgery   _x___ TAKE THE FOLLOWING MEDICATION THE MORNING OF SURGERY WITH A SMALL SIP OF WATER. These include:  1. PROTONIX (PANTOPRAZOLE)  2. LEXAPRO (ESCITALOPRAM)  3. YOU MAY TAKE HYDROCODONE FOR PAIN IF NEEDED  4.  5.  6.  ____Fleets enema or Magnesium Citrate as directed.   ____ Use CHG Soap or sage wipes as directed on instruction sheet   ____ Use inhalers on the day of surgery and bring to hospital day of surgery  ____ Stop Metformin and Janumet 2 days prior to surgery.    ____ Take 1/2 of usual insulin dose the night before surgery and none on the morning surgery.   _x___ Follow recommendations from Cardiologist, Pulmonologist or PCP regarding stopping Aspirin, Coumadin, Plavix ,Eliquis, Effient, or Pradaxa, and Pletal-LAST DOSE OF ASPIRIN WAS ON 9-12. PT STILL TAKING PLAVIX (LAST DOSE ON 9-13) I INSTRUCTED PT THAT HE NEEDS TO CALL DR  JUENGELS OFFICE TODAY TO SEE IF HE NEEDS TO HOLD HIS DOSE TODAY SINCE SURGERY IS TOMORROW  X____Stop Anti-inflammatories such as Advil, Aleve, Ibuprofen, Motrin, Naproxen, Naprosyn, Goodies powders or aspirin products NOW-OK to take Tylenol    ____ Stop supplements until after surgery.     ____ Bring C-Pap to the hospital.

## 2020-08-12 ENCOUNTER — Encounter: Payer: Self-pay | Admitting: Otolaryngology

## 2020-08-12 ENCOUNTER — Encounter
Admission: RE | Disposition: A | Discharge status: Home / Self Care | Payer: Self-pay | Source: Home / Self Care | Attending: Otolaryngology

## 2020-08-12 ENCOUNTER — Ambulatory Visit
Admission: RE | Admit: 2020-08-12 | Discharge: 2020-08-12 | Disposition: A | Discharge status: Home / Self Care | Payer: Medicare Other | Attending: Otolaryngology | Admitting: Otolaryngology

## 2020-08-12 ENCOUNTER — Ambulatory Visit: Payer: Medicare Other | Admitting: Urgent Care

## 2020-08-12 ENCOUNTER — Other Ambulatory Visit: Payer: Self-pay

## 2020-08-12 ENCOUNTER — Ambulatory Visit: Payer: Medicare Other | Admitting: Anesthesiology

## 2020-08-12 DIAGNOSIS — F419 Anxiety disorder, unspecified: Secondary | ICD-10-CM | POA: Diagnosis not present

## 2020-08-12 DIAGNOSIS — Z8673 Personal history of transient ischemic attack (TIA), and cerebral infarction without residual deficits: Secondary | ICD-10-CM | POA: Insufficient documentation

## 2020-08-12 DIAGNOSIS — K219 Gastro-esophageal reflux disease without esophagitis: Secondary | ICD-10-CM | POA: Insufficient documentation

## 2020-08-12 DIAGNOSIS — E119 Type 2 diabetes mellitus without complications: Secondary | ICD-10-CM | POA: Diagnosis not present

## 2020-08-12 DIAGNOSIS — Z7984 Long term (current) use of oral hypoglycemic drugs: Secondary | ICD-10-CM | POA: Diagnosis not present

## 2020-08-12 DIAGNOSIS — C119 Malignant neoplasm of nasopharynx, unspecified: Secondary | ICD-10-CM | POA: Insufficient documentation

## 2020-08-12 DIAGNOSIS — I10 Essential (primary) hypertension: Secondary | ICD-10-CM | POA: Diagnosis not present

## 2020-08-12 DIAGNOSIS — Z7902 Long term (current) use of antithrombotics/antiplatelets: Secondary | ICD-10-CM | POA: Diagnosis not present

## 2020-08-12 DIAGNOSIS — F329 Major depressive disorder, single episode, unspecified: Secondary | ICD-10-CM | POA: Diagnosis not present

## 2020-08-12 DIAGNOSIS — Z7951 Long term (current) use of inhaled steroids: Secondary | ICD-10-CM | POA: Diagnosis not present

## 2020-08-12 DIAGNOSIS — Z79899 Other long term (current) drug therapy: Secondary | ICD-10-CM | POA: Insufficient documentation

## 2020-08-12 DIAGNOSIS — Z885 Allergy status to narcotic agent status: Secondary | ICD-10-CM | POA: Diagnosis not present

## 2020-08-12 DIAGNOSIS — Z7982 Long term (current) use of aspirin: Secondary | ICD-10-CM | POA: Insufficient documentation

## 2020-08-12 HISTORY — PX: NASOPHARYNGOSCOPY: SHX5210

## 2020-08-12 LAB — CBC
HCT: 39.3 % (ref 39.0–52.0)
Hemoglobin: 13.2 g/dL (ref 13.0–17.0)
MCH: 29.6 pg (ref 26.0–34.0)
MCHC: 33.6 g/dL (ref 30.0–36.0)
MCV: 88.1 fL (ref 80.0–100.0)
Platelets: 178 10*3/uL (ref 150–400)
RBC: 4.46 MIL/uL (ref 4.22–5.81)
RDW: 12.6 % (ref 11.5–15.5)
WBC: 5.5 10*3/uL (ref 4.0–10.5)
nRBC: 0 % (ref 0.0–0.2)

## 2020-08-12 LAB — GLUCOSE, CAPILLARY
Glucose-Capillary: 368 mg/dL — ABNORMAL HIGH (ref 70–99)
Glucose-Capillary: 333 mg/dL — ABNORMAL HIGH (ref 70–99)
Glucose-Capillary: 275 mg/dL — ABNORMAL HIGH (ref 70–99)
Glucose-Capillary: 246 mg/dL — ABNORMAL HIGH (ref 70–99)

## 2020-08-12 LAB — BASIC METABOLIC PANEL
Anion gap: 9 (ref 5–15)
BUN: 7 mg/dL — ABNORMAL LOW (ref 8–23)
CO2: 27 mmol/L (ref 22–32)
Calcium: 8.5 mg/dL — ABNORMAL LOW (ref 8.9–10.3)
Chloride: 96 mmol/L — ABNORMAL LOW (ref 98–111)
Creatinine, Ser: 0.84 mg/dL (ref 0.61–1.24)
GFR calc Af Amer: >60 mL/min (ref >60)
GFR calc non Af Amer: >60 mL/min (ref >60)
Glucose, Bld: 374 mg/dL — ABNORMAL HIGH (ref 70–99)
Potassium: 4.2 mmol/L (ref 3.5–5.1)
Sodium: 132 mmol/L — ABNORMAL LOW (ref 135–145)

## 2020-08-12 SURGERY — NASOPHARYNGOSCOPY
Anesthesia: General | Site: Nose

## 2020-08-12 MED ORDER — PROPOFOL 10 MG/ML IV BOLUS
INTRAVENOUS | Status: DC | PRN
  Administered 2020-08-12: 120 mg via INTRAVENOUS

## 2020-08-12 MED ORDER — ONDANSETRON HCL 4 MG/2ML IJ SOLN: 4.0000 mg | Freq: Once | INTRAMUSCULAR | Status: DC | PRN

## 2020-08-12 MED ORDER — ROCURONIUM BROMIDE 100 MG/10ML IV SOLN
INTRAVENOUS | Status: DC | PRN
  Administered 2020-08-12: 30 mg via INTRAVENOUS

## 2020-08-12 MED ORDER — ONDANSETRON HCL 4 MG/2ML IJ SOLN
INTRAMUSCULAR | Status: AC
  Filled 2020-08-12: qty 2

## 2020-08-12 MED ORDER — FENTANYL CITRATE (PF) 100 MCG/2ML IJ SOLN
INTRAMUSCULAR | Status: DC | PRN
Start: 2020-08-12 — End: 2020-08-12
  Administered 2020-08-12: 50 ug via INTRAVENOUS
  Administered 2020-08-12: 75 ug via INTRAVENOUS

## 2020-08-12 MED ORDER — INSULIN ASPART 100 UNIT/ML ~~LOC~~ SOLN
SUBCUTANEOUS | Status: AC
  Filled 2020-08-12: qty 1

## 2020-08-12 MED ORDER — HYDROCODONE-ACETAMINOPHEN 10-325 MG PO TABS
ORAL_TABLET | ORAL | Status: AC
  Administered 2020-08-12: 0.5 via ORAL
  Filled 2020-08-12: qty 1

## 2020-08-12 MED ORDER — SUGAMMADEX SODIUM 200 MG/2ML IV SOLN
INTRAVENOUS | Status: DC | PRN
  Administered 2020-08-12: 200 mg via INTRAVENOUS

## 2020-08-12 MED ORDER — ORAL CARE MOUTH RINSE: 15.0000 mL | Freq: Once | OROMUCOSAL | Status: AC

## 2020-08-12 MED ORDER — SUCCINYLCHOLINE CHLORIDE 20 MG/ML IJ SOLN
INTRAMUSCULAR | Status: DC | PRN
  Administered 2020-08-12: 100 mg via INTRAVENOUS

## 2020-08-12 MED ORDER — FENTANYL CITRATE (PF) 100 MCG/2ML IJ SOLN: 25.0000 ug | INTRAMUSCULAR | Status: DC | PRN

## 2020-08-12 MED ORDER — INSULIN ASPART 100 UNIT/ML ~~LOC~~ SOLN: 4.0000 [IU] | Freq: Once | SUBCUTANEOUS | Status: AC

## 2020-08-12 MED ORDER — ROCURONIUM BROMIDE 10 MG/ML (PF) SYRINGE
PREFILLED_SYRINGE | INTRAVENOUS | Status: AC
  Filled 2020-08-12: qty 10

## 2020-08-12 MED ORDER — FENTANYL CITRATE (PF) 100 MCG/2ML IJ SOLN
INTRAMUSCULAR | Status: AC
  Filled 2020-08-12: qty 2

## 2020-08-12 MED ORDER — DEXAMETHASONE SODIUM PHOSPHATE 10 MG/ML IJ SOLN
INTRAMUSCULAR | Status: DC | PRN
  Administered 2020-08-12: 10 mg via INTRAVENOUS

## 2020-08-12 MED ORDER — SUCCINYLCHOLINE CHLORIDE 200 MG/10ML IV SOSY
PREFILLED_SYRINGE | INTRAVENOUS | Status: AC
  Filled 2020-08-12: qty 10

## 2020-08-12 MED ORDER — INSULIN ASPART 100 UNIT/ML ~~LOC~~ SOLN
10.0000 [IU] | Freq: Once | SUBCUTANEOUS | Status: AC
  Administered 2020-08-12: 10 [IU] via SUBCUTANEOUS

## 2020-08-12 MED ORDER — ONDANSETRON HCL 4 MG/2ML IJ SOLN
INTRAMUSCULAR | Status: DC | PRN
  Administered 2020-08-12: 4 mg via INTRAVENOUS

## 2020-08-12 MED ORDER — LIDOCAINE-EPINEPHRINE (PF) 1 %-1:200000 IJ SOLN
INTRAMUSCULAR | Status: AC
  Filled 2020-08-12: qty 30

## 2020-08-12 MED ORDER — CHLORHEXIDINE GLUCONATE 0.12 % MT SOLN
15.0000 mL | Freq: Once | OROMUCOSAL | Status: AC
  Administered 2020-08-12: 15 mL via OROMUCOSAL

## 2020-08-12 MED ORDER — SODIUM CHLORIDE 0.9 % IV SOLN: INTRAVENOUS | Status: DC

## 2020-08-12 MED ORDER — INSULIN ASPART 100 UNIT/ML ~~LOC~~ SOLN
SUBCUTANEOUS | Status: AC
  Administered 2020-08-12: 4 [IU] via SUBCUTANEOUS
  Filled 2020-08-12: qty 1

## 2020-08-12 MED ORDER — DEXAMETHASONE SODIUM PHOSPHATE 10 MG/ML IJ SOLN
INTRAMUSCULAR | Status: AC
  Filled 2020-08-12: qty 1

## 2020-08-12 MED ORDER — HYDROCODONE-ACETAMINOPHEN 10-325 MG PO TABS
1.0000 | ORAL_TABLET | Freq: Four times a day (QID) | ORAL | Status: DC | PRN
  Filled 2020-08-12: qty 1

## 2020-08-12 MED ORDER — PROPOFOL 10 MG/ML IV BOLUS
INTRAVENOUS | Status: AC
  Filled 2020-08-12: qty 20

## 2020-08-12 MED ORDER — PHENYLEPHRINE HCL 10 % OP SOLN
Freq: Once | OPHTHALMIC | Status: AC
  Filled 2020-08-12: qty 10

## 2020-08-12 MED ORDER — HYDROCODONE-ACETAMINOPHEN 10-325 MG PO TABS
0.5000 | ORAL_TABLET | Freq: Four times a day (QID) | ORAL | Status: DC | PRN
  Filled 2020-08-12: qty 1

## 2020-08-12 SURGICAL SUPPLY — 12 items
BLADE SURG 15 STRL LF DISP TIS (BLADE) ×1 IMPLANT
BLADE SURG 15 STRL SS (BLADE) ×3
CANISTER SUCT 1200ML W/VALVE (MISCELLANEOUS) ×3 IMPLANT
COVER WAND RF STERILE (DRAPES) ×3 IMPLANT
GLOVE PROTEXIS LATEX SZ 7.5 (GLOVE) ×6 IMPLANT
GOWN STRL REUS W/ TWL LRG LVL3 (GOWN DISPOSABLE) ×2 IMPLANT
GOWN STRL REUS W/TWL LRG LVL3 (GOWN DISPOSABLE) ×6
NEEDLE ANESTHESIA  27G X 3.5 (NEEDLE) ×3
NEEDLE ANESTHESIA 27G X 3.5 (NEEDLE) ×1 IMPLANT
PACK HEAD/NECK (MISCELLANEOUS) ×3 IMPLANT
PATTIES SURGICAL .5 X3 (DISPOSABLE) ×3 IMPLANT
SYR 3ML LL SCALE MARK (SYRINGE) ×3 IMPLANT

## 2020-08-12 NOTE — H&P (Signed)
H&P has been reviewed and patient reevaluated, no changes necessary. To be downloaded later.  

## 2020-08-12 NOTE — Anesthesia Preprocedure Evaluation (Signed)
Anesthesia Evaluation  Patient identified by MRN, date of birth, ID band Patient awake    Reviewed: Allergy & Precautions, NPO status , Patient's Chart, lab work & pertinent test results  Airway Mallampati: II  TM Distance: >3 FB     Dental  (+) Poor Dentition, Partial Upper   Pulmonary shortness of breath and with exertion, former smoker,    Pulmonary exam normal        Cardiovascular hypertension, Normal cardiovascular exam     Neuro/Psych PSYCHIATRIC DISORDERS Anxiety Depression CVA    GI/Hepatic GERD  ,(+)     substance abuse  alcohol use,   Endo/Other  diabetes  Renal/GU negative Renal ROS  negative genitourinary   Musculoskeletal   Abdominal Normal abdominal exam  (+)   Peds negative pediatric ROS (+)  Hematology   Anesthesia Other Findings   Reproductive/Obstetrics                             Anesthesia Physical Anesthesia Plan  ASA: III  Anesthesia Plan: General   Post-op Pain Management:    Induction: Intravenous  PONV Risk Score and Plan:   Airway Management Planned: Oral ETT  Additional Equipment:   Intra-op Plan:   Post-operative Plan: Extubation in OR  Informed Consent: I have reviewed the patients History and Physical, chart, labs and discussed the procedure including the risks, benefits and alternatives for the proposed anesthesia with the patient or authorized representative who has indicated his/her understanding and acceptance.     Dental advisory given  Plan Discussed with: CRNA and Surgeon  Anesthesia Plan Comments:         Anesthesia Quick Evaluation

## 2020-08-12 NOTE — Transfer of Care (Signed)
Immediate Anesthesia Transfer of Care Note  Patient: Kenneth Franklin  Procedure(s) Performed: ENDOSCOPIC NASOPHARYNGOSCOPY WITH BIOPSY (N/A Nose)  Patient Location: PACU  Anesthesia Type:General  Level of Consciousness: awake and alert   Airway & Oxygen Therapy: Patient Spontanous Breathing and Patient connected to face mask oxygen  Post-op Assessment: Report given to RN and Post -op Vital signs reviewed and stable  Post vital signs: Reviewed and stable  Last Vitals:  Vitals Value Taken Time  BP 150/84 08/12/20 1122  Temp 36.8 C 08/12/20 1122  Pulse 91 08/12/20 1124  Resp 20 08/12/20 1124  SpO2 94 % 08/12/20 1124  Vitals shown include unvalidated device data.  Last Pain:  Vitals:   08/12/20 1122  TempSrc:   PainSc: 0-No pain         Complications: No complications documented.

## 2020-08-12 NOTE — Anesthesia Procedure Notes (Signed)
Procedure Name: Intubation Date/Time: 08/12/2020 10:42 AM Performed by: Genevie Ann, CRNA Pre-anesthesia Checklist: Patient identified, Emergency Drugs available, Suction available, Patient being monitored and Timeout performed Patient Re-evaluated:Patient Re-evaluated prior to induction Oxygen Delivery Method: Circle system utilized Preoxygenation: Pre-oxygenation with 100% oxygen Induction Type: IV induction Laryngoscope Size: Mac and 4 Grade View: Grade I Number of attempts: 1 Airway Equipment and Method: Stylet Placement Confirmation: ETT inserted through vocal cords under direct vision,  positive ETCO2 and breath sounds checked- equal and bilateral Secured at: 21 cm Dental Injury: Teeth and Oropharynx as per pre-operative assessment

## 2020-08-12 NOTE — Discharge Instructions (Signed)

## 2020-08-12 NOTE — Op Note (Signed)
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  Procedure: The patient was brought to the operating room placed in the supine position.  He was given general anesthesia by oral endotracheal intubation.  A 0 degree scope was used to visualize his nose.  Septum was deviated extremely severely to the left side so the scope could not be placed through his left side.  The right side the scope could be seen and passed through the nostril however it was very tight.  The inferior turbinate was outfractured to give little more room on the right side.  Cottonoid pledgets soaked in Afrin mixed with lidocaine was used for vasoconstriction.  Patient was prepped draped sterile fashion.  The 0 degree scope was placed through the right nostril and you could see a very pink granular tissue that was filling the posterior wall of the nasopharynx.  This was exophytic and sticking out in little globs of tissue.  It was very soft and friable and bled very easily.  There was a defect in the right posterior septal wall with granular tissue coming through the mucosal defect and this also was very friable.  With the 30 degree scope I can see across the other side and there was again similar granular tissue filling the nasopharyngeal area with a little bit of a defect at the midline, but still filled with granular tissue that was friable.  Pictures were taken of this for verification.  Using a Blakesley ethmoid forcep biopsies were taken of this tissue.  It broke apart very easily and was very soft.  Multiple pieces were taken for frozen  section and some permanent section.  Cottonoid pledgets were placed again for vasoconstriction to help control the bleeding.  The patient tolerated the procedure well.  He was awakened taken to recovery room in satisfactory condition.  Dispo:   To PACU to be discharged home.  Plan: He will rest at home with his head elevated.  As soon as we get the path report we will have him come in to go over and decide upon a treatment plan.  Awaiting on his MRI to evaluate the depth of invasion.  He has pain meds at home that he can use if needed but I do not expect him to have much pain from the procedure.  He does not need any other specific treatment or medication for his nose or nasopharynx currently until we get the path report.  Kenneth Franklin  08/12/2020 11:10 AM

## 2020-08-13 ENCOUNTER — Other Ambulatory Visit: Payer: Self-pay | Admitting: Pathology

## 2020-08-13 ENCOUNTER — Ambulatory Visit
Admission: RE | Admit: 2020-08-13 | Discharge: 2020-08-13 | Disposition: A | Discharge status: Home / Self Care | Payer: Medicare Other | Source: Ambulatory Visit | Attending: Otolaryngology | Admitting: Otolaryngology

## 2020-08-13 ENCOUNTER — Other Ambulatory Visit: Payer: Self-pay | Admitting: Otolaryngology

## 2020-08-13 ENCOUNTER — Other Ambulatory Visit: Payer: Self-pay

## 2020-08-13 DIAGNOSIS — C313 Malignant neoplasm of sphenoid sinus: Secondary | ICD-10-CM

## 2020-08-13 LAB — SURGICAL PATHOLOGY

## 2020-08-13 MED ORDER — GADOBUTROL 1 MMOL/ML IV SOLN
7.0000 mL | Freq: Once | INTRAVENOUS | Status: AC | PRN
  Administered 2020-08-13: 7 mL via INTRAVENOUS

## 2020-08-13 NOTE — Anesthesia Postprocedure Evaluation (Signed)
Anesthesia Post Note  Patient: Kenneth Franklin  Procedure(s) Performed: ENDOSCOPIC NASOPHARYNGOSCOPY WITH BIOPSY (N/A Nose)  Patient location during evaluation: PACU Anesthesia Type: General Level of consciousness: awake and alert and oriented Pain management: pain level controlled Vital Signs Assessment: post-procedure vital signs reviewed and stable Respiratory status: spontaneous breathing Cardiovascular status: blood pressure returned to baseline Anesthetic complications: no   No complications documented.   Last Vitals:  Vitals:   08/12/20 1207 08/12/20 1216  BP: (!) 160/96 (!) 163/91  Pulse: 89 91  Resp: (!) 21 16  Temp: 36.7 C 36.7 C  SpO2: 96% 94%    Last Pain:  Vitals:   08/13/20 0813  TempSrc:   PainSc: 8                  [name]

## 2020-08-17 ENCOUNTER — Encounter: Payer: Self-pay | Admitting: Hematology and Oncology

## 2020-08-17 NOTE — Progress Notes (Signed)
[facility]  [address] [place], [place] [address] Phone: [phone]  Fax: [phone]   Clinic Day:  08/18/2020  Referring physician: Margaretha Sheffield, MD  Chief Complaint: Kenneth Franklin is a 74 y.o. male with a history of nasopharyngeal carcinoma who is referred in consultation by Dr Margaretha Sheffield for assessment and management.   HPI: The patient presented to Kenneth Butter, NP on 06/12/2020 with a persistent headache x 2 months. Pain was primarily frontal, behind eyes and nose, and occasionally radiates to the occipital region. He had tried Advil Cold and Sinus without relief. He denied a past medical history of migraines or abnormal headaches. He was prescribed doxycycline 100 mg BID for 10 days for suspected sinus infection.  He was then prescribed Levaquin.  As he did not improve, he was referred to Dr. Margaretha Sheffield.   Outside CT scan revealed markedly deviated septum to the left.  There was a mass in the nasopharynx that extended into the sphenoid on both sides.  Mass had eroded bone at the sphenoid and at the base of the skull.    Endoscopy by Dr. Kathyrn Sheriff revealed an ulcerated mass in the nasopharynx.  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   Head MRI on 08/13/2020 revealed a 5.9 cm nasopharyngeal mass consistent with known nasopharyngeal carcinoma with involvement of the clivus and sphenoid sinus. There was moderate chronic small vessel ischemic disease and cerebral atrophy.  There was no intracranial extension.  He is scheduled for a PET scan today.   Symptomatically, he feels fairly decent. He states that he has not been very active as of late. He continues to have headaches in the same region. He also feels congested.    Past Medical History:  Diagnosis Date  . Acute ischemic stroke ([place]) 2017  . Alcohol abuse   . Anxiety   . Cancer ([place])   . Depression   .  Diabetes mellitus without complication ([place])   . Dyspnea    pcp knows and ordered rescue inhaler  . Hyperlipidemia   . Hypertension   . Skin cancer    Squamous Cell Carcinoma In Situ    Past Surgical History:  Procedure Laterality Date  . CATARACT EXTRACTION Bilateral   . COLONOSCOPY    . NASOPHARYNGOSCOPY N/A 08/12/2020   Procedure: ENDOSCOPIC NASOPHARYNGOSCOPY WITH BIOPSY;  Surgeon: Margaretha Sheffield, MD;  Location: [facility] ORS;  Service: ENT;  Laterality: N/A;    Family History  Problem Relation Age of Onset  . Diabetes Brother     Social History:  reports that he quit smoking about a year ago. His smoking use included cigars and cigarettes. He quit after 40.00 years of use. He has never used smokeless tobacco. He reports previous alcohol use. He reports that he does not use drugs. He smoked 1 pack/day x 40 years.  He denies exposure of radiation or toxins; he did work with asphalt. He lives alone in [place]. He is widowed.  The patient is accompanied by his step-daughter, Kenneth Franklin, today.  Allergies:  Allergies  Allergen Reactions  . Propoxyphene     Unknown reaction    Current Medications: Current Outpatient Medications  Medication Sig Dispense Refill  . ALPRAZolam (XANAX) 0.25 MG tablet TAKE 1 TABLET EVERY DAY AS NEEDED FOR ANXIETY (Patient taking differently: Take 0.25 mg by mouth daily as needed for anxiety. ) 30 tablet 2  . atorvastatin (LIPITOR) 80 MG tablet TAKE 1 TABLET (80  MG TOTAL) BY MOUTH NIGHTLY. (Patient taking differently: Take 80 mg by mouth every evening. ) 90 tablet 0  . clopidogrel (PLAVIX) 75 MG tablet TAKE 1 TABLET (75 MG TOTAL) BY MOUTH DAILY. 90 tablet 0  . escitalopram (LEXAPRO) 20 MG tablet Take 20 mg by mouth every morning.     . fluticasone (FLOVENT HFA) 110 MCG/ACT inhaler Inhale 2 puffs into the lungs daily as needed (shortness of breath).    Marland Kitchen glipiZIDE (GLUCOTROL) 5 MG tablet Take 5 mg by mouth daily before breakfast.     .  HYDROcodone-acetaminophen (NORCO) 10-325 MG tablet Take 0.5 tablets by mouth daily as needed for pain.    Marland Kitchen losartan (COZAAR) 100 MG tablet TAKE 1 TABLET (100 MG TOTAL) BY MOUTH DAILY. (Patient taking differently: Take 100 mg by mouth every morning. ) 90 tablet 0  . metFORMIN (GLUCOPHAGE) 500 MG tablet Take 1 tablet (500 mg total) by mouth daily with breakfast. 90 tablet 0  . mirtazapine (REMERON) 30 MG tablet Take 1 tablet (30 mg total) by mouth at bedtime. (Patient taking differently: Take 15 mg by mouth at bedtime. ) 30 tablet 0  . pantoprazole (PROTONIX) 20 MG tablet Take 20 mg by mouth daily before lunch.     . sitaGLIPtin (JANUVIA) 100 MG tablet Take 100 mg by mouth every morning.     . sucralfate (CARAFATE) 1 g tablet Take 1 tablet (1 g total) by mouth 4 (four) times daily. 120 tablet 11  . VITAMIN D PO Take 1 capsule by mouth daily.    . busPIRone (BUSPAR) 10 MG tablet TAKE 1 TABLET (10 MG TOTAL) BY MOUTH 2 (TWO) TIMES DAILY. (Patient not taking: Reported on 08/10/2020) 180 tablet 0  . DENTA 5000 PLUS 1.1 % CREA dental cream Place 1 application onto teeth at bedtime. (Patient not taking: Reported on 08/18/2020)    . escitalopram (LEXAPRO) 10 MG tablet Take 1 tablet (10 mg total) by mouth daily. Stop your lexapro in a week 6 tablet 0  . nystatin cream (MYCOSTATIN) Apply 1 application topically 2 (two) times daily as needed (yeast).  (Patient not taking: Reported on 08/18/2020)     No current facility-administered medications for this visit.    Review of Systems  Constitutional: Positive for malaise/fatigue. Negative for fever and weight loss.       Feels "fairly decent".  HENT: Positive for congestion and sore throat (mild s/p endoscopy). Negative for ear pain and hearing loss.   Eyes: Negative for blurred vision and photophobia.  Respiratory: Positive for shortness of breath (on exertion).   Cardiovascular: Negative.  Negative for chest pain and palpitations.  Gastrointestinal: Negative  for abdominal pain, blood in stool, constipation, diarrhea, melena, nausea and vomiting.  Genitourinary: Negative.  Negative for dysuria, frequency and urgency.  Musculoskeletal: Negative.  Negative for back pain, joint pain and neck pain.  Skin: Negative for itching and rash.  Neurological: Positive for weakness and headaches. Negative for sensory change, speech change and seizures.       Slightly unsteady on feet.  Endo/Heme/Allergies: Negative.   Psychiatric/Behavioral: Negative.  Negative for hallucinations.   Performance status (ECOG): 1  Vitals Blood pressure 122/75, pulse 92, temperature 98.4 F (36.9 C), temperature source Tympanic, resp. rate 18, height 6' (1.829 m), weight 175 lb 6 oz (79.5 kg), SpO2 98 %.   Physical Exam Constitutional:      General: He is not in acute distress.    Appearance: Normal appearance. He is well-developed.  Interventions: Face mask in place.  HENT:     Head: Normocephalic and atraumatic.     Right Ear: Hearing normal.     Left Ear: Hearing normal.     Nose: Septal deviation present.     Mouth/Throat:     Mouth: No oral lesions.     Comments: Small area of ecchymosis right posterior pharynx. Eyes:     Conjunctiva/sclera: Conjunctivae normal.     Pupils: Pupils are equal, round, and reactive to light.  Cardiovascular:     Rate and Rhythm: Normal rate and regular rhythm.     Heart sounds: Normal heart sounds. No murmur heard.  No friction rub. No gallop.   Pulmonary:     Effort: Pulmonary effort is normal.     Breath sounds: Normal breath sounds. No wheezing, rhonchi or rales.  Abdominal:     General: Bowel sounds are normal.     Palpations: Abdomen is soft. There is no hepatomegaly, splenomegaly or mass.     Tenderness: There is no abdominal tenderness.  Musculoskeletal:        General: No tenderness. Normal range of motion.     Cervical back: Normal range of motion and neck supple.  Lymphadenopathy:     Head:     Right side of  head: No preauricular, posterior auricular or occipital adenopathy.     Left side of head: No preauricular, posterior auricular or occipital adenopathy.     Cervical: No cervical adenopathy.     Upper Body:     Right upper body: No supraclavicular or axillary adenopathy.     Left upper body: No supraclavicular or axillary adenopathy.     Lower Body: No right inguinal adenopathy. No left inguinal adenopathy.  Skin:    General: Skin is warm and dry.     Findings: Bruising (small area of bruising mid abdomen) present. No erythema, lesion or rash.  Neurological:     Mental Status: He is alert and oriented to person, place, and time.  Psychiatric:        Behavior: Behavior normal.        Thought Content: Thought content normal.        Judgment: Judgment normal.    No visits with results within 3 Day(s) from this visit.  Latest known visit with results is:  Admission on 08/12/2020, Discharged on 08/12/2020  Component Date Value Ref Range Status  . Sodium 08/12/2020 132* 135 - 145 mmol/L Final  . Potassium 08/12/2020 4.2  3.5 - 5.1 mmol/L Final  . Chloride 08/12/2020 96* 98 - 111 mmol/L Final  . CO2 08/12/2020 27  22 - 32 mmol/L Final  . Glucose, Bld 08/12/2020 374* 70 - 99 mg/dL Final   Glucose reference range applies only to samples taken after fasting for at least 8 hours.  . BUN 08/12/2020 7* 8 - 23 mg/dL Final  . Creatinine, Ser 08/12/2020 0.84  0.61 - 1.24 mg/dL Final  . Calcium 08/12/2020 8.5* 8.9 - 10.3 mg/dL Final  . GFR calc non Af Amer 08/12/2020 >60  >60 mL/min Final  . GFR calc Af Amer 08/12/2020 >60  >60 mL/min Final  . Anion gap 08/12/2020 9  5 - 15 Final   Performed at [facility], [address], [place], [place] [address]  . WBC 08/12/2020 5.5  4.0 - 10.5 K/uL Final  . RBC 08/12/2020 4.46  4.22 - 5.81 MIL/uL Final  . Hemoglobin 08/12/2020 13.2  13.0 - 17.0 g/dL Final  .  HCT 08/12/2020 39.3  39 - 52 % Final  . MCV 08/12/2020 88.1  80.0 - 100.0 fL Final  .  MCH 08/12/2020 29.6  26.0 - 34.0 pg Final  . MCHC 08/12/2020 33.6  30.0 - 36.0 g/dL Final  . RDW 08/12/2020 12.6  11.5 - 15.5 % Final  . Platelets 08/12/2020 178  150 - 400 K/uL Final  . nRBC 08/12/2020 0.0  0.0 - 0.2 % Final   Performed at [facility], [address] [place], [place] [address]  . Glucose-Capillary 08/12/2020 368* 70 - 99 mg/dL Final   Glucose reference range applies only to samples taken after fasting for at least 8 hours.  . Glucose-Capillary 08/12/2020 333* 70 - 99 mg/dL Final   Glucose reference range applies only to samples taken after fasting for at least 8 hours.  . SURGICAL PATHOLOGY 08/12/2020    Final-Edited                   Value:SURGICAL PATHOLOGY CASE: [id number] PATIENT: Franklyn Lor Surgical Pathology Report  Specimen Submitted: A. Nasopharyngeal tumor  Clinical History: Malignant neoplasm nasopharynx, sphenoid sinus.  DIAGNOSIS: A. NASOPHARYNGEAL TUMOR; BIOPSY: - KERATINIZING SQUAMOUS CELL CARCINOMA, MODERATELY DIFFERENTIATED.  GROSS DESCRIPTION: Intraoperative Consultation:     Labeled: Nasopharyngeal tumor     Received: Fresh     Specimen: Nasopharyngoscopy with biopsy     Pathologic evaluation performed: Frozen section diagnosis     Diagnosis: FSA, representative section: Squamous cell carcinoma     Communicated to: Called to Dr. Kathyrn Sheriff at 11:36 AM on 08/12/2020 Quay Burow M.D.     Tissue submitted: A touch preparation with 1 Diff-Quik stained slide is performed.  Additionally, representative sections of the specimen are frozen as FSA1 and 3 frozen section slides are performed.  A. Labeled: Nasopharyngeal tumor Received: Fresh Tissue fragment(s): Multiple                          Size: Aggregate, 1.4 x 1.4 x 0.4 cm Description: Received fresh on a Telfa pad are fragments of pink soft tissue and blood clot.  A touch preparation with 1 Diff-Quik stained slide is performed.  Representative sections are frozen as FSA1  with 3 frozen section slides performed.  The frozen section remnant is submitted in cassette 1 and the remainder of the specimen is submitted in cassette 2.  Final Diagnosis performed by Quay Burow, MD.   Electronically signed 08/13/2020 9:25:55AM The electronic signature indicates that the named Attending Pathologist has evaluated the specimen Technical component performed at [facility]: [facility], [address] [place], [place] [address] Lab: [id number] Dir: Rush Farmer, MD, MMM  Professional component performed at [facility], [facility], [place], [place], [place] [address] Lab: [id number] Dir: Dellia Nims. Rubinas, MD  . Glucose-Capillary 08/12/2020 275* 70 - 99 mg/dL Final   Glucose reference range applies only to samples taken after fasting for at least 8 hours.  . Glucose-Capillary 08/12/2020 246* 70 - 99 mg/dL Final   Glucose reference range applies only to samples taken after fasting for at least 8 hours.    Assessment:  DAEMIAN GAHM is a 74 y.o. male with nasopharyngeal carcinoma s/p biopsy on 08/12/2020.  Pathology revealed keratinizing squamous cell carcinoma, moderately differentiated.   Head MRI on 08/13/2020 revealed a 5.9 cm nasopharyngeal mass consistent with known nasopharyngeal carcinoma with involvement of the clivus and sphenoid sinus. There was moderate chronic small vessel ischemic disease and cerebral atrophy.  There was no intracranial extension.  Symptomatically, he has headaches and feels congested.  Plan: 1.   Labs today:  CBC with diff, CMP. 2.   Clinical [id number] nasopharyngeal carcinoma  Discuss pathology from biopsy and current imaging studies.  Discuss plan for PET scan to assess potential lymph node involvement and metastatic disease.  Discuss tumor is unresectable.  Patient requires concurrent chemotherapy and radiation + additional chemotherapy.   Treatment can be upfront radiation and chemotherapy followed by adjuvant chemotherapy   OR    Cisplatin 40 mg/m2 weekly x 6 with radiation followed by cisplatin 80 mg/m2 D1 + 5FU 1000 mg/m2/day CI D1-4 q 28 days x 3 cycles   Treatment can be induction chemotherapy followed by concurrent chemotherapy and radiation    Taxotere 75 mg/m2 + cisplatin 75 mg/m2 q 21 days x 2 cycles followed by cisplatin 40 mg/m2 weekly x 6 with XRT.    Gemcitabine 1000 mg/m2 D1,8 + cisplatin 80 mg/m2 x 3 cycles followed by cisplatin 40 mg/m2 weekly x 6 with XRT.  Discuss baseline audiogram pre: cisplatin.  Discuss substitution of carboplatin if renal function is poor.  Discuss port-a-cath placement and chemotherapy class.  Follow-up PET scan scheduled for today. 3.   Port placement with Dr Delana Meyer. 4.   Audiogram with Dr Kathyrn Sheriff. 5.   Preauth chemotherapy. 6.   Chemotherapy class. 7.   Tumor board on 08/20/2020. 8.   RTC in 1 week for MD assessment, labs (CBC with diff, CMP, Mg), and day 1 of cycle #1 cisplatin and gemcitabine.   I discussed the assessment and treatment plan with the patient.  The patient was provided an opportunity to ask questions and all were answered.  The patient agreed with the plan and demonstrated an understanding of the instructions.  The patient was advised to call back if the symptoms worsen or if the condition fails to improve as anticipated.  Addendum: PET scan on 08/18/2020 revealed a 5.7 x 4.2 cm destructive hypermetabolic mass (SUV 51.88) involving the sphenoid sinus, skull base and posterior nasopharynx c/w the patient's known squamous cell carcinoma.  There was no locoregional lymphadenopathy or distant metastatic disease.  Plan for concurrent chemotherapy (ciplatin) and radiation followed by adjuvant chemotherapy.   Melissa C. Mike Gip, MD, PhD    08/18/2020, 11:10 AM  I, Jacqualyn Posey, am acting as Education administrator for [employer]. Mike Gip, MD, PhD.  I, Melissa C. Mike Gip, MD, have reviewed the above documentation for accuracy and completeness, and I agree with the above.

## 2020-08-18 ENCOUNTER — Inpatient Hospital Stay: Payer: Medicare Other

## 2020-08-18 ENCOUNTER — Ambulatory Visit
Admission: RE | Admit: 2020-08-18 | Discharge: 2020-08-18 | Disposition: A | Discharge status: Home / Self Care | Payer: Medicare Other | Source: Ambulatory Visit | Attending: Otolaryngology | Admitting: Otolaryngology

## 2020-08-18 ENCOUNTER — Inpatient Hospital Stay: Payer: Medicare Other | Attending: Hematology and Oncology | Admitting: Hematology and Oncology

## 2020-08-18 ENCOUNTER — Other Ambulatory Visit: Payer: Self-pay

## 2020-08-18 ENCOUNTER — Encounter: Payer: Self-pay | Admitting: Hematology and Oncology

## 2020-08-18 ENCOUNTER — Telehealth: Payer: Self-pay

## 2020-08-18 VITALS — BP 122/75 | HR 92 | Temp 98.4°F | Resp 18 | Ht 72.0 in | Wt 175.4 lb

## 2020-08-18 DIAGNOSIS — R17 Unspecified jaundice: Secondary | ICD-10-CM | POA: Diagnosis not present

## 2020-08-18 DIAGNOSIS — C313 Malignant neoplasm of sphenoid sinus: Secondary | ICD-10-CM | POA: Insufficient documentation

## 2020-08-18 DIAGNOSIS — C119 Malignant neoplasm of nasopharynx, unspecified: Secondary | ICD-10-CM | POA: Insufficient documentation

## 2020-08-18 LAB — COMPREHENSIVE METABOLIC PANEL
ALT: 23 U/L (ref 0–44)
AST: 27 U/L (ref 15–41)
Albumin: 4.2 g/dL (ref 3.5–5.0)
Alkaline Phosphatase: 94 U/L (ref 38–126)
Anion gap: 10 (ref 5–15)
BUN: 12 mg/dL (ref 8–23)
CO2: 27 mmol/L (ref 22–32)
Calcium: 8.9 mg/dL (ref 8.9–10.3)
Chloride: 96 mmol/L — ABNORMAL LOW (ref 98–111)
Creatinine, Ser: 1.19 mg/dL (ref 0.61–1.24)
GFR calc Af Amer: >60 mL/min (ref >60)
GFR calc non Af Amer: >60 mL/min (ref >60)
Glucose, Bld: 225 mg/dL — ABNORMAL HIGH (ref 70–99)
Potassium: 3.8 mmol/L (ref 3.5–5.1)
Sodium: 133 mmol/L — ABNORMAL LOW (ref 135–145)
Total Bilirubin: 1.5 mg/dL — ABNORMAL HIGH (ref 0.3–1.2)
Total Protein: 8.1 g/dL (ref 6.5–8.1)

## 2020-08-18 LAB — CBC WITH DIFFERENTIAL/PLATELET
Abs Immature Granulocytes: 0.12 10*3/uL — ABNORMAL HIGH (ref 0.00–0.07)
Basophils Absolute: 0 10*3/uL (ref 0.0–0.1)
Basophils Relative: 0 %
Eosinophils Absolute: 0.1 10*3/uL (ref 0.0–0.5)
Eosinophils Relative: 1 %
HCT: 45.1 % (ref 39.0–52.0)
Hemoglobin: 14.8 g/dL (ref 13.0–17.0)
Immature Granulocytes: 1 %
Lymphocytes Relative: 14 %
Lymphs Abs: 1.4 10*3/uL (ref 0.7–4.0)
MCH: 29.5 pg (ref 26.0–34.0)
MCHC: 32.8 g/dL (ref 30.0–36.0)
MCV: 89.8 fL (ref 80.0–100.0)
Monocytes Absolute: 0.7 10*3/uL (ref 0.1–1.0)
Monocytes Relative: 7 %
Neutro Abs: 8.2 10*3/uL — ABNORMAL HIGH (ref 1.7–7.7)
Neutrophils Relative %: 77 %
Platelets: 254 10*3/uL (ref 150–400)
RBC: 5.02 MIL/uL (ref 4.22–5.81)
RDW: 13.2 % (ref 11.5–15.5)
WBC: 10.6 10*3/uL — ABNORMAL HIGH (ref 4.0–10.5)
nRBC: 0 % (ref 0.0–0.2)

## 2020-08-18 LAB — BILIRUBIN, DIRECT: Bilirubin, Direct: 0.3 mg/dL — ABNORMAL HIGH (ref 0.0–0.2)

## 2020-08-18 MED ORDER — FLUDEOXYGLUCOSE F - 18 (FDG) INJECTION
8.9000 | Freq: Once | INTRAVENOUS | Status: AC | PRN
  Administered 2020-08-18: 9.71 via INTRAVENOUS

## 2020-08-18 NOTE — Patient Instructions (Signed)
 Cisplatin injection What is this medicine? CISPLATIN (SIS pla tin) is a chemotherapy drug. It targets fast dividing cells, like cancer cells, and causes these cells to die. This medicine is used to treat many types of cancer like bladder, ovarian, and testicular cancers. This medicine may be used for other purposes; ask your health care provider or pharmacist if you have questions. COMMON BRAND NAME(S): Platinol, Platinol -AQ What should I tell my health care provider before I take this medicine? They need to know if you have any of these conditions:  eye disease, vision problems  hearing problems  kidney disease  low blood counts, like white cells, platelets, or red blood cells  tingling of the fingers or toes, or other nerve disorder  an unusual or allergic reaction to cisplatin, carboplatin, oxaliplatin, other medicines, foods, dyes, or preservatives  pregnant or trying to get pregnant  breast-feeding How should I use this medicine? This drug is given as an infusion into a vein. It is administered in a hospital or clinic by a specially trained health care professional. Talk to your pediatrician regarding the use of this medicine in children. Special care may be needed. Overdosage: If you think you have taken too much of this medicine contact a poison control center or emergency room at once. NOTE: This medicine is only for you. Do not share this medicine with others. What if I miss a dose? It is important not to miss a dose. Call your doctor or health care professional if you are unable to keep an appointment. What may interact with this medicine? This medicine may interact with the following medications:  foscarnet  certain antibiotics like amikacin, gentamicin, neomycin, polymyxin B, streptomycin, tobramycin, vancomycin This list may not describe all possible interactions. Give your health care provider a list of all the medicines, herbs, non-prescription drugs, or  dietary supplements you use. Also tell them if you smoke, drink alcohol, or use illegal drugs. Some items may interact with your medicine. What should I watch for while using this medicine? Your condition will be monitored carefully while you are receiving this medicine. You will need important blood work done while you are taking this medicine. This drug may make you feel generally unwell. This is not uncommon, as chemotherapy can affect healthy cells as well as cancer cells. Report any side effects. Continue your course of treatment even though you feel ill unless your doctor tells you to stop. This medicine may increase your risk of getting an infection. Call your healthcare professional for advice if you get a fever, chills, or sore throat, or other symptoms of a cold or flu. Do not treat yourself. Try to avoid being around people who are sick. Avoid taking medicines that contain aspirin, acetaminophen, ibuprofen, naproxen, or ketoprofen unless instructed by your healthcare professional. These medicines may hide a fever. This medicine may increase your risk to bruise or bleed. Call your doctor or health care professional if you notice any unusual bleeding. Be careful brushing and flossing your teeth or using a toothpick because you may get an infection or bleed more easily. If you have any dental work done, tell your dentist you are receiving this medicine. Do not become pregnant while taking this medicine or for 14 months after stopping it. Women should inform their healthcare professional if they wish to become pregnant or think they might be pregnant. Men should not father a child while taking this medicine and for 11 months after stopping it. There is potential   for serious side effects to an unborn child. Talk to your healthcare professional for more information. Do not breast-feed an infant while taking this medicine. This medicine has caused ovarian failure in some women. This medicine may make  it more difficult to get pregnant. Talk to your healthcare professional if you are concerned about your fertility. This medicine has caused decreased sperm counts in some men. This may make it more difficult to father a child. Talk to your healthcare professional if you are concerned about your fertility. Drink fluids as directed while you are taking this medicine. This will help protect your kidneys. Call your doctor or health care professional if you get diarrhea. Do not treat yourself. What side effects may I notice from receiving this medicine? Side effects that you should report to your doctor or health care professional as soon as possible:  allergic reactions like skin rash, itching or hives, swelling of the face, lips, or tongue  blurred vision  changes in vision  decreased hearing or ringing of the ears  nausea, vomiting  pain, redness, or irritation at site where injected  pain, tingling, numbness in the hands or feet  signs and symptoms of bleeding such as bloody or black, tarry stools; red or dark brown urine; spitting up blood or brown material that looks like coffee grounds; red spots on the skin; unusual bruising or bleeding from the eyes, gums, or nose  signs and symptoms of infection like fever; chills; cough; sore throat; pain or trouble passing urine  signs and symptoms of kidney injury like trouble passing urine or change in the amount of urine  signs and symptoms of low red blood cells or anemia such as unusually weak or tired; feeling faint or lightheaded; falls; breathing problems Side effects that usually do not require medical attention (report to your doctor or health care professional if they continue or are bothersome):  loss of appetite  mouth sores  muscle cramps This list may not describe all possible side effects. Call your doctor for medical advice about side effects. You may report side effects to FDA at 1-800-FDA-1088. Where should I keep my  medicine? This drug is given in a hospital or clinic and will not be stored at home. NOTE: This sheet is a summary. It may not cover all possible information. If you have questions about this medicine, talk to your doctor, pharmacist, or health care provider.  2020 Elsevier/Gold Standard (2018-11-09 15:59:17)   Gemcitabine injection What is this medicine? GEMCITABINE (jem SYE ta been) is a chemotherapy drug. This medicine is used to treat many types of cancer like breast cancer, lung cancer, pancreatic cancer, and ovarian cancer. This medicine may be used for other purposes; ask your health care provider or pharmacist if you have questions. COMMON BRAND NAME(S): Gemzar, Infugem What should I tell my health care provider before I take this medicine? They need to know if you have any of these conditions:  blood disorders  infection  kidney disease  liver disease  lung or breathing disease, like asthma  recent or ongoing radiation therapy  an unusual or allergic reaction to gemcitabine, other chemotherapy, other medicines, foods, dyes, or preservatives  pregnant or trying to get pregnant  breast-feeding How should I use this medicine? This drug is given as an infusion into a vein. It is administered in a hospital or clinic by a specially trained health care professional. Talk to your pediatrician regarding the use of this medicine in children. Special care may   be needed. Overdosage: If you think you have taken too much of this medicine contact a poison control center or emergency room at once. NOTE: This medicine is only for you. Do not share this medicine with others. What if I miss a dose? It is important not to miss your dose. Call your doctor or health care professional if you are unable to keep an appointment. What may interact with this medicine?  medicines to increase blood counts like filgrastim, pegfilgrastim, sargramostim  some other chemotherapy drugs like  cisplatin  vaccines Talk to your doctor or health care professional before taking any of these medicines:  acetaminophen  aspirin  ibuprofen  ketoprofen  naproxen This list may not describe all possible interactions. Give your health care provider a list of all the medicines, herbs, non-prescription drugs, or dietary supplements you use. Also tell them if you smoke, drink alcohol, or use illegal drugs. Some items may interact with your medicine. What should I watch for while using this medicine? Visit your doctor for checks on your progress. This drug may make you feel generally unwell. This is not uncommon, as chemotherapy can affect healthy cells as well as cancer cells. Report any side effects. Continue your course of treatment even though you feel ill unless your doctor tells you to stop. In some cases, you may be given additional medicines to help with side effects. Follow all directions for their use. Call your doctor or health care professional for advice if you get a fever, chills or sore throat, or other symptoms of a cold or flu. Do not treat yourself. This drug decreases your body's ability to fight infections. Try to avoid being around people who are sick. This medicine may increase your risk to bruise or bleed. Call your doctor or health care professional if you notice any unusual bleeding. Be careful brushing and flossing your teeth or using a toothpick because you may get an infection or bleed more easily. If you have any dental work done, tell your dentist you are receiving this medicine. Avoid taking products that contain aspirin, acetaminophen, ibuprofen, naproxen, or ketoprofen unless instructed by your doctor. These medicines may hide a fever. Do not become pregnant while taking this medicine or for 6 months after stopping it. Women should inform their doctor if they wish to become pregnant or think they might be pregnant. Men should not father a child while taking this  medicine and for 3 months after stopping it. There is a potential for serious side effects to an unborn child. Talk to your health care professional or pharmacist for more information. Do not breast-feed an infant while taking this medicine or for at least 1 week after stopping it. Men should inform their doctors if they wish to father a child. This medicine may lower sperm counts. Talk with your doctor or health care professional if you are concerned about your fertility. What side effects may I notice from receiving this medicine? Side effects that you should report to your doctor or health care professional as soon as possible:  allergic reactions like skin rash, itching or hives, swelling of the face, lips, or tongue  breathing problems  pain, redness, or irritation at site where injected  signs and symptoms of a dangerous change in heartbeat or heart rhythm like chest pain; dizziness; fast or irregular heartbeat; palpitations; feeling faint or lightheaded, falls; breathing problems  signs of decreased platelets or bleeding - bruising, pinpoint red spots on the skin, black, tarry stools, blood   in the urine  signs of decreased red blood cells - unusually weak or tired, feeling faint or lightheaded, falls  signs of infection - fever or chills, cough, sore throat, pain or difficulty passing urine  signs and symptoms of kidney injury like trouble passing urine or change in the amount of urine  signs and symptoms of liver injury like dark yellow or brown urine; general ill feeling or flu-like symptoms; light-colored stools; loss of appetite; nausea; right upper belly pain; unusually weak or tired; yellowing of the eyes or skin  swelling of ankles, feet, hands Side effects that usually do not require medical attention (report to your doctor or health care professional if they continue or are bothersome):  constipation  diarrhea  hair loss  loss of  appetite  nausea  rash  vomiting This list may not describe all possible side effects. Call your doctor for medical advice about side effects. You may report side effects to FDA at 1-800-FDA-1088. Where should I keep my medicine? This drug is given in a hospital or clinic and will not be stored at home. NOTE: This sheet is a summary. It may not cover all possible information. If you have questions about this medicine, talk to your doctor, pharmacist, or health care provider.  2020 Elsevier/Gold Standard (2018-02-07 18:06:11)  

## 2020-08-18 NOTE — Progress Notes (Signed)
The patient reports muscle tighten across his chest after surgery x 3-4 days ( pain today 8) with movement but better than before.

## 2020-08-18 NOTE — Telephone Encounter (Signed)
Fax port orders to Dr Delana Meyer office for Kenneth Franklin placement. Fax conformation confirmed.

## 2020-08-19 ENCOUNTER — Telehealth (INDEPENDENT_AMBULATORY_CARE_PROVIDER_SITE_OTHER): Payer: Self-pay

## 2020-08-19 NOTE — Patient Instructions (Signed)
 Cisplatin injection What is this medicine? CISPLATIN (SIS pla tin) is a chemotherapy drug. It targets fast dividing cells, like cancer cells, and causes these cells to die. This medicine is used to treat many types of cancer like bladder, ovarian, and testicular cancers. This medicine may be used for other purposes; ask your health care provider or pharmacist if you have questions. COMMON BRAND NAME(S): Platinol, Platinol -AQ What should I tell my health care provider before I take this medicine? They need to know if you have any of these conditions:  eye disease, vision problems  hearing problems  kidney disease  low blood counts, like white cells, platelets, or red blood cells  tingling of the fingers or toes, or other nerve disorder  an unusual or allergic reaction to cisplatin, carboplatin, oxaliplatin, other medicines, foods, dyes, or preservatives  pregnant or trying to get pregnant  breast-feeding How should I use this medicine? This drug is given as an infusion into a vein. It is administered in a hospital or clinic by a specially trained health care professional. Talk to your pediatrician regarding the use of this medicine in children. Special care may be needed. Overdosage: If you think you have taken too much of this medicine contact a poison control center or emergency room at once. NOTE: This medicine is only for you. Do not share this medicine with others. What if I miss a dose? It is important not to miss a dose. Call your doctor or health care professional if you are unable to keep an appointment. What may interact with this medicine? This medicine may interact with the following medications:  foscarnet  certain antibiotics like amikacin, gentamicin, neomycin, polymyxin B, streptomycin, tobramycin, vancomycin This list may not describe all possible interactions. Give your health care provider a list of all the medicines, herbs, non-prescription drugs, or  dietary supplements you use. Also tell them if you smoke, drink alcohol, or use illegal drugs. Some items may interact with your medicine. What should I watch for while using this medicine? Your condition will be monitored carefully while you are receiving this medicine. You will need important blood work done while you are taking this medicine. This drug may make you feel generally unwell. This is not uncommon, as chemotherapy can affect healthy cells as well as cancer cells. Report any side effects. Continue your course of treatment even though you feel ill unless your doctor tells you to stop. This medicine may increase your risk of getting an infection. Call your healthcare professional for advice if you get a fever, chills, or sore throat, or other symptoms of a cold or flu. Do not treat yourself. Try to avoid being around people who are sick. Avoid taking medicines that contain aspirin, acetaminophen, ibuprofen, naproxen, or ketoprofen unless instructed by your healthcare professional. These medicines may hide a fever. This medicine may increase your risk to bruise or bleed. Call your doctor or health care professional if you notice any unusual bleeding. Be careful brushing and flossing your teeth or using a toothpick because you may get an infection or bleed more easily. If you have any dental work done, tell your dentist you are receiving this medicine. Do not become pregnant while taking this medicine or for 14 months after stopping it. Women should inform their healthcare professional if they wish to become pregnant or think they might be pregnant. Men should not father a child while taking this medicine and for 11 months after stopping it. There is potential   for serious side effects to an unborn child. Talk to your healthcare professional for more information. Do not breast-feed an infant while taking this medicine. This medicine has caused ovarian failure in some women. This medicine may make  it more difficult to get pregnant. Talk to your healthcare professional if you are concerned about your fertility. This medicine has caused decreased sperm counts in some men. This may make it more difficult to father a child. Talk to your healthcare professional if you are concerned about your fertility. Drink fluids as directed while you are taking this medicine. This will help protect your kidneys. Call your doctor or health care professional if you get diarrhea. Do not treat yourself. What side effects may I notice from receiving this medicine? Side effects that you should report to your doctor or health care professional as soon as possible:  allergic reactions like skin rash, itching or hives, swelling of the face, lips, or tongue  blurred vision  changes in vision  decreased hearing or ringing of the ears  nausea, vomiting  pain, redness, or irritation at site where injected  pain, tingling, numbness in the hands or feet  signs and symptoms of bleeding such as bloody or black, tarry stools; red or dark brown urine; spitting up blood or brown material that looks like coffee grounds; red spots on the skin; unusual bruising or bleeding from the eyes, gums, or nose  signs and symptoms of infection like fever; chills; cough; sore throat; pain or trouble passing urine  signs and symptoms of kidney injury like trouble passing urine or change in the amount of urine  signs and symptoms of low red blood cells or anemia such as unusually weak or tired; feeling faint or lightheaded; falls; breathing problems Side effects that usually do not require medical attention (report to your doctor or health care professional if they continue or are bothersome):  loss of appetite  mouth sores  muscle cramps This list may not describe all possible side effects. Call your doctor for medical advice about side effects. You may report side effects to FDA at 1-800-FDA-1088. Where should I keep my  medicine? This drug is given in a hospital or clinic and will not be stored at home. NOTE: This sheet is a summary. It may not cover all possible information. If you have questions about this medicine, talk to your doctor, pharmacist, or health care provider.  2020 Elsevier/Gold Standard (2018-11-09 15:59:17)   Gemcitabine injection What is this medicine? GEMCITABINE (jem SYE ta been) is a chemotherapy drug. This medicine is used to treat many types of cancer like breast cancer, lung cancer, pancreatic cancer, and ovarian cancer. This medicine may be used for other purposes; ask your health care provider or pharmacist if you have questions. COMMON BRAND NAME(S): Gemzar, Infugem What should I tell my health care provider before I take this medicine? They need to know if you have any of these conditions:  blood disorders  infection  kidney disease  liver disease  lung or breathing disease, like asthma  recent or ongoing radiation therapy  an unusual or allergic reaction to gemcitabine, other chemotherapy, other medicines, foods, dyes, or preservatives  pregnant or trying to get pregnant  breast-feeding How should I use this medicine? This drug is given as an infusion into a vein. It is administered in a hospital or clinic by a specially trained health care professional. Talk to your pediatrician regarding the use of this medicine in children. Special care may   be needed. Overdosage: If you think you have taken too much of this medicine contact a poison control center or emergency room at once. NOTE: This medicine is only for you. Do not share this medicine with others. What if I miss a dose? It is important not to miss your dose. Call your doctor or health care professional if you are unable to keep an appointment. What may interact with this medicine?  medicines to increase blood counts like filgrastim, pegfilgrastim, sargramostim  some other chemotherapy drugs like  cisplatin  vaccines Talk to your doctor or health care professional before taking any of these medicines:  acetaminophen  aspirin  ibuprofen  ketoprofen  naproxen This list may not describe all possible interactions. Give your health care provider a list of all the medicines, herbs, non-prescription drugs, or dietary supplements you use. Also tell them if you smoke, drink alcohol, or use illegal drugs. Some items may interact with your medicine. What should I watch for while using this medicine? Visit your doctor for checks on your progress. This drug may make you feel generally unwell. This is not uncommon, as chemotherapy can affect healthy cells as well as cancer cells. Report any side effects. Continue your course of treatment even though you feel ill unless your doctor tells you to stop. In some cases, you may be given additional medicines to help with side effects. Follow all directions for their use. Call your doctor or health care professional for advice if you get a fever, chills or sore throat, or other symptoms of a cold or flu. Do not treat yourself. This drug decreases your body's ability to fight infections. Try to avoid being around people who are sick. This medicine may increase your risk to bruise or bleed. Call your doctor or health care professional if you notice any unusual bleeding. Be careful brushing and flossing your teeth or using a toothpick because you may get an infection or bleed more easily. If you have any dental work done, tell your dentist you are receiving this medicine. Avoid taking products that contain aspirin, acetaminophen, ibuprofen, naproxen, or ketoprofen unless instructed by your doctor. These medicines may hide a fever. Do not become pregnant while taking this medicine or for 6 months after stopping it. Women should inform their doctor if they wish to become pregnant or think they might be pregnant. Men should not father a child while taking this  medicine and for 3 months after stopping it. There is a potential for serious side effects to an unborn child. Talk to your health care professional or pharmacist for more information. Do not breast-feed an infant while taking this medicine or for at least 1 week after stopping it. Men should inform their doctors if they wish to father a child. This medicine may lower sperm counts. Talk with your doctor or health care professional if you are concerned about your fertility. What side effects may I notice from receiving this medicine? Side effects that you should report to your doctor or health care professional as soon as possible:  allergic reactions like skin rash, itching or hives, swelling of the face, lips, or tongue  breathing problems  pain, redness, or irritation at site where injected  signs and symptoms of a dangerous change in heartbeat or heart rhythm like chest pain; dizziness; fast or irregular heartbeat; palpitations; feeling faint or lightheaded, falls; breathing problems  signs of decreased platelets or bleeding - bruising, pinpoint red spots on the skin, black, tarry stools, blood   in the urine  signs of decreased red blood cells - unusually weak or tired, feeling faint or lightheaded, falls  signs of infection - fever or chills, cough, sore throat, pain or difficulty passing urine  signs and symptoms of kidney injury like trouble passing urine or change in the amount of urine  signs and symptoms of liver injury like dark yellow or brown urine; general ill feeling or flu-like symptoms; light-colored stools; loss of appetite; nausea; right upper belly pain; unusually weak or tired; yellowing of the eyes or skin  swelling of ankles, feet, hands Side effects that usually do not require medical attention (report to your doctor or health care professional if they continue or are bothersome):  constipation  diarrhea  hair loss  loss of  appetite  nausea  rash  vomiting This list may not describe all possible side effects. Call your doctor for medical advice about side effects. You may report side effects to FDA at 1-800-FDA-1088. Where should I keep my medicine? This drug is given in a hospital or clinic and will not be stored at home. NOTE: This sheet is a summary. It may not cover all possible information. If you have questions about this medicine, talk to your doctor, pharmacist, or health care provider.  2020 Elsevier/Gold Standard (2018-02-07 18:06:11)  

## 2020-08-19 NOTE — Telephone Encounter (Signed)
Spoke with the patient and he is scheduled with Dr. Lucky Cowboy for a port placement on 08/24/20 with a 9:30 am arrival time to the [facility]. Covid testing on 08/21/20 between 8-1 pm at the [facility]. Pre-procedure instructions were discussed and will be mailed.

## 2020-08-20 ENCOUNTER — Other Ambulatory Visit: Payer: Medicare Other

## 2020-08-20 ENCOUNTER — Other Ambulatory Visit: Admission: RE | Admit: 2020-08-20 | Payer: Medicare Other | Source: Ambulatory Visit

## 2020-08-20 ENCOUNTER — Inpatient Hospital Stay: Payer: Medicare Other

## 2020-08-20 ENCOUNTER — Telehealth: Payer: Self-pay | Admitting: Hematology and Oncology

## 2020-08-20 ENCOUNTER — Inpatient Hospital Stay: Payer: Medicare Other | Admitting: Oncology

## 2020-08-20 ENCOUNTER — Other Ambulatory Visit: Payer: Self-pay

## 2020-08-20 ENCOUNTER — Other Ambulatory Visit: Payer: Self-pay | Admitting: Hematology and Oncology

## 2020-08-20 DIAGNOSIS — C119 Malignant neoplasm of nasopharynx, unspecified: Secondary | ICD-10-CM

## 2020-08-20 LAB — GLUCOSE, CAPILLARY: Glucose-Capillary: 212 mg/dL — ABNORMAL HIGH (ref 70–99)

## 2020-08-20 NOTE — H&P (View-Only) (Signed)
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

## 2020-08-20 NOTE — Telephone Encounter (Signed)
Re:  Tumor board discussions  I spoke to the patient today about discussions during tumor board.  He will receive concurrent radiation and chemotherapy (ciplatin) followed by chemotherapy.  Dr Olena Leatherwood office will be contacting him soon for his appointment next week.   Lequita Asal, MD

## 2020-08-20 NOTE — Progress Notes (Signed)
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

## 2020-08-21 ENCOUNTER — Other Ambulatory Visit
Admission: RE | Admit: 2020-08-21 | Discharge: 2020-08-21 | Disposition: A | Discharge status: Home / Self Care | Payer: Medicare Other | Source: Ambulatory Visit | Attending: Vascular Surgery | Admitting: Vascular Surgery

## 2020-08-21 DIAGNOSIS — Z01812 Encounter for preprocedural laboratory examination: Secondary | ICD-10-CM | POA: Diagnosis present

## 2020-08-21 DIAGNOSIS — Z20822 Contact with and (suspected) exposure to covid-19: Secondary | ICD-10-CM | POA: Diagnosis not present

## 2020-08-21 LAB — SARS CORONAVIRUS 2 (TAT 6-24 HRS): SARS Coronavirus 2: NEGATIVE

## 2020-08-21 NOTE — Progress Notes (Addendum)
Tumor Board Documentation  Kenneth Franklin was presented by Dr Mike Gip at our Tumor Board on 08/20/2020, which included representatives from medical oncology, radiation oncology, internal medicine, navigation, pathology, radiology, surgical, pharmacy, research, palliative care, surgical oncology.  Kenneth Franklin currently presents as a new patient, for Kenneth Franklin, for new positive pathology with history of the following treatments: active survellience, surgical intervention(s).  Additionally, we reviewed previous medical and familial history, history of present illness, and recent lab results along with all available histopathologic and imaging studies. The tumor board considered available treatment options and made the following recommendations: Neoadjuvant chemotherapy  + Concurrent chemotherapy and radiation  The following procedures/referrals were also placed: No orders of the defined types were placed in this encounter.   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  Tumor board is a meeting of clinicians from various specialty areas who evaluate and discuss patients for whom a multidisciplinary approach is being considered. Final determinations in the plan of care are those of the provider(s). The responsibility for follow up of recommendations given during tumor board is that of the provider.   Today's extended care, comprehensive team conference, Kenneth Franklin was not present for the discussion and was not examined.   Multidisciplinary Tumor Board is a multidisciplinary case peer review process.  Decisions discussed in the Multidisciplinary Tumor Board reflect the opinions of the specialists present at the conference without having examined the patient.  Ultimately, treatment and diagnostic decisions rest  with the primary provider(s) and the patient.

## 2020-08-24 ENCOUNTER — Other Ambulatory Visit: Payer: Self-pay

## 2020-08-24 ENCOUNTER — Other Ambulatory Visit: Payer: Self-pay | Admitting: *Deleted

## 2020-08-24 ENCOUNTER — Ambulatory Visit
Admission: RE | Admit: 2020-08-24 | Discharge: 2020-08-24 | Disposition: A | Discharge status: Home / Self Care | Payer: Medicare Other | Attending: Vascular Surgery | Admitting: Vascular Surgery

## 2020-08-24 ENCOUNTER — Other Ambulatory Visit: Payer: Self-pay | Admitting: Hematology and Oncology

## 2020-08-24 ENCOUNTER — Encounter
Admission: RE | Disposition: A | Discharge status: Home / Self Care | Payer: Self-pay | Source: Home / Self Care | Attending: Vascular Surgery

## 2020-08-24 ENCOUNTER — Encounter: Payer: Self-pay | Admitting: Vascular Surgery

## 2020-08-24 ENCOUNTER — Other Ambulatory Visit (INDEPENDENT_AMBULATORY_CARE_PROVIDER_SITE_OTHER): Payer: Self-pay | Admitting: Nurse Practitioner

## 2020-08-24 DIAGNOSIS — C119 Malignant neoplasm of nasopharynx, unspecified: Secondary | ICD-10-CM | POA: Insufficient documentation

## 2020-08-24 HISTORY — PX: PORTA CATH INSERTION: CATH118285

## 2020-08-24 LAB — GLUCOSE, CAPILLARY: Glucose-Capillary: 246 mg/dL — ABNORMAL HIGH (ref 70–99)

## 2020-08-24 SURGERY — PORTA CATH INSERTION
Anesthesia: Moderate Sedation

## 2020-08-24 MED ORDER — SODIUM CHLORIDE 0.9 % IV SOLN: INTRAVENOUS | Status: DC

## 2020-08-24 MED ORDER — CEFAZOLIN SODIUM-DEXTROSE 2-4 GM/100ML-% IV SOLN
INTRAVENOUS | Status: AC
  Administered 2020-08-24: 2 g
  Filled 2020-08-24: qty 100

## 2020-08-24 MED ORDER — DIPHENHYDRAMINE HCL 50 MG/ML IJ SOLN: 50.0000 mg | Freq: Once | INTRAMUSCULAR | Status: DC | PRN

## 2020-08-24 MED ORDER — CHLORHEXIDINE GLUCONATE CLOTH 2 % EX PADS
6.0000 | MEDICATED_PAD | Freq: Every day | CUTANEOUS | Status: DC
  Administered 2020-08-24: 6 via TOPICAL

## 2020-08-24 MED ORDER — HYDROMORPHONE HCL 1 MG/ML IJ SOLN: 1.0000 mg | Freq: Once | INTRAMUSCULAR | Status: DC | PRN

## 2020-08-24 MED ORDER — FAMOTIDINE 20 MG PO TABS: 40.0000 mg | ORAL_TABLET | Freq: Once | ORAL | Status: DC | PRN

## 2020-08-24 MED ORDER — FENTANYL CITRATE (PF) 100 MCG/2ML IJ SOLN
INTRAMUSCULAR | Status: DC | PRN
Start: 2020-08-24 — End: 2020-08-24
  Administered 2020-08-24: 25 ug via INTRAVENOUS
  Administered 2020-08-24: 50 ug via INTRAVENOUS

## 2020-08-24 MED ORDER — METHYLPREDNISOLONE SODIUM SUCC 125 MG IJ SOLR: 125.0000 mg | Freq: Once | INTRAMUSCULAR | Status: DC | PRN

## 2020-08-24 MED ORDER — MIDAZOLAM HCL 2 MG/ML PO SYRP: 8.0000 mg | ORAL_SOLUTION | Freq: Once | ORAL | Status: DC | PRN

## 2020-08-24 MED ORDER — MIDAZOLAM HCL 2 MG/2ML IJ SOLN
INTRAMUSCULAR | Status: DC | PRN
  Administered 2020-08-24: 2 mg via INTRAVENOUS
  Administered 2020-08-24: 1 mg via INTRAVENOUS

## 2020-08-24 MED ORDER — LIDOCAINE-PRILOCAINE 2.5-2.5 % EX CREA: 1.0000 "application " | TOPICAL_CREAM | CUTANEOUS | 1 refills | Status: DC | PRN

## 2020-08-24 MED ORDER — SODIUM CHLORIDE 0.9 % IV SOLN
Freq: Once | INTRAVENOUS | Status: DC
  Filled 2020-08-24: qty 2

## 2020-08-24 MED ORDER — FENTANYL CITRATE (PF) 100 MCG/2ML IJ SOLN
INTRAMUSCULAR | Status: DC
Start: 2020-08-24 — End: 2020-08-24
  Filled 2020-08-24: qty 2

## 2020-08-24 MED ORDER — ONDANSETRON HCL 4 MG/2ML IJ SOLN: 4.0000 mg | Freq: Four times a day (QID) | INTRAMUSCULAR | Status: DC | PRN

## 2020-08-24 MED ORDER — MIDAZOLAM HCL 5 MG/5ML IJ SOLN
INTRAMUSCULAR | Status: AC
  Filled 2020-08-24: qty 5

## 2020-08-24 MED ORDER — CEFAZOLIN SODIUM-DEXTROSE 2-4 GM/100ML-% IV SOLN: 2.0000 g | Freq: Once | INTRAVENOUS | Status: DC

## 2020-08-24 SURGICAL SUPPLY — 15 items
ADH SKN CLS APL DERMABOND .7 (GAUZE/BANDAGES/DRESSINGS) ×1
DERMABOND ADVANCED (GAUZE/BANDAGES/DRESSINGS) ×2
DERMABOND ADVANCED .7 DNX12 (GAUZE/BANDAGES/DRESSINGS) ×1 IMPLANT
HANDLE YANKAUER SUCT BULB TIP (MISCELLANEOUS) ×3 IMPLANT
KIT PORT POWER 8FR ISP CVUE (Port) ×3 IMPLANT
PACK ANGIOGRAPHY (CUSTOM PROCEDURE TRAY) ×3 IMPLANT
PENCIL ELECTRO HAND CTR (MISCELLANEOUS) ×3 IMPLANT
SPONGE XRAY 4X4 16PLY STRL (MISCELLANEOUS) ×3 IMPLANT
SUT MNCRL 4-0 (SUTURE) ×3
SUT MNCRL 4-0 27XMFL (SUTURE) ×1
SUT PROLENE 0 CT 1 30 (SUTURE) ×3 IMPLANT
SUT VIC AB 3-0 CT1 27 (SUTURE) ×3
SUT VIC AB 3-0 CT1 TAPERPNT 27 (SUTURE) ×1 IMPLANT
SUTURE MNCRL 4-0 27XMF (SUTURE) ×1 IMPLANT
TOWEL OR 17X26 4PK STRL BLUE (TOWEL DISPOSABLE) ×3 IMPLANT

## 2020-08-24 NOTE — Interval H&P Note (Signed)
History and Physical Interval Note:  08/24/2020 9:38 AM  Kenneth Franklin  has presented today for surgery, with the diagnosis of Porta Cath Placement  Nasopharyngeal Ca Covid  Sept 24.  The various methods of treatment have been discussed with the patient and family. After consideration of risks, benefits and other options for treatment, the patient has consented to  Procedure(s): PORTA CATH INSERTION (N/A) as a surgical intervention.  The patient's history has been reviewed, patient examined, no change in status, stable for surgery.  I have reviewed the patient's chart and labs.  Questions were answered to the patient's satisfaction.     Leotis Pain

## 2020-08-24 NOTE — Op Note (Signed)
      [facility]       Operative Note  Date: 08/24/2020  Preoperative diagnosis:  1. Nasopharyngeal cancer  Postoperative diagnosis:  Same as above  Procedures: #1. Ultrasound guidance for vascular access to the right internal jugular vein. #2. Fluoroscopic guidance for placement of catheter. #3. Placement of CT compatible Port-A-Cath, right internal jugular vein.  Surgeon: Leotis Pain, MD.   Anesthesia: Local with moderate conscious sedation for approximately 27  minutes using 3 mg of Versed and 75 mcg of Fentanyl  Fluoroscopy time: less than 1 minute  Contrast used: 0  Estimated blood loss: 3 cc  Indication for the procedure:  The patient is a 74 y.o.male with nasopharyngeal cancer.  The patient needs a Port-A-Cath for durable venous access, chemotherapy, lab draws, and CT scans. We are asked to place this. Risks and benefits were discussed and informed consent was obtained.  Description of procedure: The patient was brought to the vascular and interventional radiology suite.  Moderate conscious sedation was administered throughout the procedure during a face to face encounter with the patient with my supervision of the RN administering medicines and monitoring the patient's vital signs, pulse oximetry, telemetry and mental status throughout from the start of the procedure until the patient was taken to the recovery room. The right neck chest and shoulder were sterilely prepped and draped, and a sterile surgical field was created. Ultrasound was used to help visualize a patent right internal jugular vein. This was then accessed under direct ultrasound guidance without difficulty with the Seldinger needle and a permanent image was recorded. A J-wire was placed. After skin nick and dilatation, the peel-away sheath was then placed over the wire. I then anesthetized an area under the clavicle approximately 1-2 fingerbreadths. A transverse incision was created and an  inferior pocket was created with electrocautery and blunt dissection. The port was then brought onto the field, placed into the pocket and secured to the chest wall with 2 Prolene sutures. The catheter was connected to the port and tunneled from the subclavicular incision to the access site. Fluoroscopic guidance was then used to cut the catheter to an appropriate length. The catheter was then placed through the peel-away sheath and the peel-away sheath was removed. The catheter tip was parked in excellent location under fluorocoscopic guidance in the SVC just above the right atrium. The pocket was then irrigated with antibiotic impregnated saline and the wound was closed with a running 3-0 Vicryl and a 4-0 Monocryl. The access incision was closed with a single 4-0 Monocryl. The Huber needle was used to withdraw blood and flush the port with heparinized saline. Dermabond was then placed as a dressing. The patient tolerated the procedure well and was taken to the recovery room in stable condition.   Leotis Pain 08/24/2020 11:45 AM   This note was created with Dragon Medical transcription system. Any errors in dictation are purely unintentional.

## 2020-08-25 ENCOUNTER — Other Ambulatory Visit: Payer: Medicare Other

## 2020-08-25 ENCOUNTER — Ambulatory Visit: Payer: Medicare Other

## 2020-08-25 ENCOUNTER — Ambulatory Visit: Payer: Medicare Other | Admitting: Hematology and Oncology

## 2020-08-26 ENCOUNTER — Other Ambulatory Visit: Payer: Self-pay

## 2020-08-26 ENCOUNTER — Ambulatory Visit
Admission: RE | Admit: 2020-08-26 | Discharge: 2020-08-26 | Disposition: A | Discharge status: Home / Self Care | Payer: Medicare Other | Source: Ambulatory Visit | Attending: Radiation Oncology | Admitting: Radiation Oncology

## 2020-08-26 ENCOUNTER — Encounter: Payer: Self-pay | Admitting: Radiation Oncology

## 2020-08-26 VITALS — BP 156/83 | HR 92 | Resp 16 | Wt 175.0 lb

## 2020-08-26 DIAGNOSIS — Z87891 Personal history of nicotine dependence: Secondary | ICD-10-CM | POA: Diagnosis not present

## 2020-08-26 DIAGNOSIS — E785 Hyperlipidemia, unspecified: Secondary | ICD-10-CM | POA: Diagnosis not present

## 2020-08-26 DIAGNOSIS — Z79899 Other long term (current) drug therapy: Secondary | ICD-10-CM | POA: Diagnosis not present

## 2020-08-26 DIAGNOSIS — R0602 Shortness of breath: Secondary | ICD-10-CM | POA: Insufficient documentation

## 2020-08-26 DIAGNOSIS — F329 Major depressive disorder, single episode, unspecified: Secondary | ICD-10-CM | POA: Insufficient documentation

## 2020-08-26 DIAGNOSIS — Z85828 Personal history of other malignant neoplasm of skin: Secondary | ICD-10-CM | POA: Diagnosis not present

## 2020-08-26 DIAGNOSIS — F419 Anxiety disorder, unspecified: Secondary | ICD-10-CM | POA: Diagnosis not present

## 2020-08-26 DIAGNOSIS — F418 Other specified anxiety disorders: Secondary | ICD-10-CM | POA: Insufficient documentation

## 2020-08-26 DIAGNOSIS — Z8673 Personal history of transient ischemic attack (TIA), and cerebral infarction without residual deficits: Secondary | ICD-10-CM | POA: Insufficient documentation

## 2020-08-26 DIAGNOSIS — E119 Type 2 diabetes mellitus without complications: Secondary | ICD-10-CM | POA: Insufficient documentation

## 2020-08-26 DIAGNOSIS — F1010 Alcohol abuse, uncomplicated: Secondary | ICD-10-CM | POA: Diagnosis not present

## 2020-08-26 DIAGNOSIS — C119 Malignant neoplasm of nasopharynx, unspecified: Secondary | ICD-10-CM | POA: Diagnosis not present

## 2020-08-26 NOTE — Consult Note (Signed)
NEW PATIENT EVALUATION  Name: Kenneth Franklin  MRN: [id number]  Date:   08/26/2020     DOB: Sep 05, 1946   This 74 y.o. 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  REFERRING PHYSICIAN: Verita Lamb, NP  CHIEF COMPLAINT:  Chief Complaint  Patient presents with  . Cancer    Initial consultation    DIAGNOSIS: The encounter diagnosis was Nasopharyngeal carcinoma ([place]).   PREVIOUS INVESTIGATIONS:  MRI scan and PET CT scans reviewed Clinical notes reviewed Pathology report reviewed Case presented at tumor conference  HPI: Patient is a 74 year old male who presented with frontal headaches.  Work-up including MRI of his brain showed a 4.9 x 5.9 x 3.7 cm mass centered in the nasopharynx extending in the posterior aspect of the nasal cavity.  Sphenoid sinus and clivus were also involved with the clivus being completely replaced by tumor.  PET CT scan confirmed large destructive hypermetabolic mass involving the sphenoid sinus skull base and posterior nasopharynx consistent with known nasopharyngeal squamous cell carcinoma.  Biopsy was positive for keratinizing squamous cell carcinoma moderately well differentiated.  Patient's case was presented at weekly conference with recommendation made for concurrent chemoradiation.  PET scan did not demonstrate any adenopathy in the head and neck region.  He is seen today for radiation oncology evaluation.  Again he states he still has mild headaches.  No change in visual fields and no bleeding.  PLANNED TREATMENT REGIMEN: Concurrent chemoradiation  PAST MEDICAL HISTORY:  has a past medical history of Acute ischemic stroke ([place]) (2017), Alcohol abuse, Anxiety, Cancer ([place]), Depression, Diabetes mellitus without complication ([place]), Dyspnea, Hyperlipidemia, Hypertension, and Skin cancer.    PAST SURGICAL HISTORY:  Past Surgical  History:  Procedure Laterality Date  . CATARACT EXTRACTION Bilateral   . COLONOSCOPY    . NASOPHARYNGOSCOPY N/A 08/12/2020   Procedure: ENDOSCOPIC NASOPHARYNGOSCOPY WITH BIOPSY;  Surgeon: Margaretha Sheffield, MD;  Location: [facility];  Service: ENT;  Laterality: N/A;  . PORTA CATH INSERTION N/A 08/24/2020   Procedure: PORTA CATH INSERTION;  Surgeon: Algernon Huxley, MD;  Location: [facility];  Service: Cardiovascular;  Laterality: N/A;    FAMILY HISTORY: family history includes Diabetes in his brother.  SOCIAL HISTORY:  reports that he quit smoking about 12 months ago. His smoking use included cigars and cigarettes. He has a 40.00 pack-year smoking history. He has never used smokeless tobacco. He reports previous alcohol use. He reports that he does not use drugs.  ALLERGIES: Propoxyphene  MEDICATIONS:  Current Outpatient Medications  Medication Sig Dispense Refill  . ALPRAZolam (XANAX) 0.25 MG tablet TAKE 1 TABLET EVERY DAY AS NEEDED FOR ANXIETY (Patient taking differently: Take 0.25 mg by mouth daily as needed for anxiety. ) 30 tablet 2  . atorvastatin (LIPITOR) 80 MG tablet TAKE 1 TABLET (80 MG TOTAL) BY MOUTH NIGHTLY. (Patient taking differently: Take 80 mg by mouth every evening. ) 90 tablet 0  . busPIRone (BUSPAR) 10 MG tablet TAKE 1 TABLET (10 MG TOTAL) BY MOUTH 2 (TWO) TIMES DAILY. 180 tablet 0  . clopidogrel (PLAVIX) 75 MG tablet TAKE 1 TABLET (75 MG TOTAL) BY MOUTH DAILY. 90 tablet 0  . DENTA 5000 PLUS 1.1 % CREA dental cream Place 1 application onto teeth at bedtime.     Marland Kitchen escitalopram (LEXAPRO) 10 MG tablet Take 1 tablet (10 mg total) by mouth daily. Stop your  lexapro in a week 6 tablet 0  . escitalopram (LEXAPRO) 20 MG tablet Take 20 mg by mouth every morning.     . fluticasone (FLOVENT HFA) 110 MCG/ACT inhaler Inhale 2 puffs into the lungs daily as needed (shortness of breath).    Marland Kitchen glipiZIDE (GLUCOTROL) 5 MG tablet Take 5 mg by mouth daily before breakfast.     .  HYDROcodone-acetaminophen (NORCO) 10-325 MG tablet Take 0.5 tablets by mouth daily as needed for pain.    Marland Kitchen lidocaine-prilocaine (EMLA) cream Apply 1 application topically as needed. Apply small amount to port site at least 1 hour prior to it being accessed, cover with plastic wrap 30 g 1  . losartan (COZAAR) 100 MG tablet TAKE 1 TABLET (100 MG TOTAL) BY MOUTH DAILY. (Patient taking differently: Take 100 mg by mouth every morning. ) 90 tablet 0  . metFORMIN (GLUCOPHAGE) 500 MG tablet Take 1 tablet (500 mg total) by mouth daily with breakfast. 90 tablet 0  . mirtazapine (REMERON) 30 MG tablet Take 1 tablet (30 mg total) by mouth at bedtime. (Patient taking differently: Take 15 mg by mouth at bedtime. ) 30 tablet 0  . nystatin cream (MYCOSTATIN) Apply 1 application topically 2 (two) times daily as needed (yeast).     . pantoprazole (PROTONIX) 20 MG tablet Take 20 mg by mouth daily before lunch.     . sitaGLIPtin (JANUVIA) 100 MG tablet Take 100 mg by mouth every morning.     . sucralfate (CARAFATE) 1 g tablet Take 1 tablet (1 g total) by mouth 4 (four) times daily. 120 tablet 11  . VITAMIN D PO Take 1 capsule by mouth daily.     No current facility-administered medications for this encounter.    ECOG PERFORMANCE STATUS:  1 - Symptomatic but completely ambulatory  REVIEW OF SYSTEMS: Patient denies any weight loss, fatigue, weakness, fever, chills or night sweats. Patient denies any loss of vision, blurred vision. Patient denies any ringing  of the ears or hearing loss. No irregular heartbeat. Patient denies heart murmur or history of fainting. Patient denies any chest pain or pain radiating to her upper extremities. Patient denies any shortness of breath, difficulty breathing at night, cough or hemoptysis. Patient denies any swelling in the lower legs. Patient denies any nausea vomiting, vomiting of blood, or coffee ground material in the vomitus. Patient denies any stomach pain. Patient states has had  normal bowel movements no significant constipation or diarrhea. Patient denies any dysuria, hematuria or significant nocturia. Patient denies any problems walking, swelling in the joints or loss of balance. Patient denies any skin changes, loss of hair or loss of weight. Patient denies any excessive worrying or anxiety or significant depression. Patient denies any problems with insomnia. Patient denies excessive thirst, polyuria, polydipsia. Patient denies any swollen glands, patient denies easy bruising or easy bleeding. Patient denies any recent infections, allergies or URI. Patient "s visual fields have not changed significantly in recent time.   PHYSICAL EXAM: BP (!) 156/83 (BP Location: Left Arm, Patient Position: Sitting)   Pulse 92   Resp 16   Wt 175 lb (79.4 kg)   BMI 23.73 kg/m  No evidence of cervical or supraclavicular adenopathy is detected.  Well-developed well-nourished patient in NAD. HEENT reveals PERLA, EOMI, discs not visualized.  Oral cavity is clear. No oral mucosal lesions are identified. Neck is clear without evidence of cervical or supraclavicular adenopathy. Lungs are clear to A&P. Cardiac examination is essentially unremarkable with regular rate and  rhythm without murmur rub or thrill. Abdomen is benign with no organomegaly or masses noted. Motor sensory and DTR levels are equal and symmetric in the upper and lower extremities. Cranial nerves II through XII are grossly intact. Proprioception is intact. No peripheral adenopathy or edema is identified. No motor or sensory levels are noted. Crude visual fields are within normal range.  LABORATORY DATA: Pathology report reviewed    RADIOLOGY RESULTS: MRI of brain as well as PET CT scan reviewed compatible with above-stated findings   IMPRESSION: Stage IV a squamous cell carcinoma of the nasopharynx in 74 year old male  PLAN: At this time elect to go ahead with concurrent chemoradiation I would plan on delivering 7000 cGy to  his nasopharynx.  I would treat also his bilateral neck nodes up to 5400 centigrade for possible microscopic involvement up to 5400 centigrade using IMRT dose painting technique.  I would use PET fusion study for treatment planning.  Risks and benefits of treatment including possible oral mucositis alteration of taste alteration of blood counts skin reaction dryness of the mouth all were described in detail to the patient.  Patient comprehends my treatment plan well.  There will be extra effort by both professional staff as well as technical staff to coordinate and manage concurrent chemoradiation and ensuing side effects during his treatments. I have personally set up and ordered CT simulation for this week.  I would like to take this opportunity to thank you for allowing me to participate in the care of your patient.Noreene Filbert, MD

## 2020-08-27 ENCOUNTER — Ambulatory Visit
Admission: RE | Admit: 2020-08-27 | Discharge: 2020-08-27 | Disposition: A | Discharge status: Home / Self Care | Payer: Medicare Other | Source: Ambulatory Visit | Attending: Radiation Oncology | Admitting: Radiation Oncology

## 2020-08-27 DIAGNOSIS — C119 Malignant neoplasm of nasopharynx, unspecified: Secondary | ICD-10-CM | POA: Insufficient documentation

## 2020-08-27 NOTE — Interval H&P Note (Signed)
History and Physical Interval Note:  08/27/2020 8:06 AM  Kenneth Franklin  has presented today for surgery, with the diagnosis of [facility] Cath Placement  Nasopharyngeal CaCovid  Sept 24.  The various methods of treatment have been discussed with the patient and family. After consideration of risks, benefits and other options for treatment, the patient has consented to  Procedure(s): PORTA CATH INSERTION (N/A) as a surgical intervention.  The patient's history has been reviewed, patient examined, no change in status, stable for surgery.  I have reviewed the patient's chart and labs.  Questions were answered to the patient's satisfaction.     Leotis Pain

## 2020-09-03 DIAGNOSIS — C119 Malignant neoplasm of nasopharynx, unspecified: Secondary | ICD-10-CM | POA: Insufficient documentation

## 2020-09-04 ENCOUNTER — Other Ambulatory Visit: Payer: Self-pay | Admitting: *Deleted

## 2020-09-04 ENCOUNTER — Other Ambulatory Visit: Payer: Self-pay

## 2020-09-04 DIAGNOSIS — C119 Malignant neoplasm of nasopharynx, unspecified: Secondary | ICD-10-CM

## 2020-09-04 NOTE — Telephone Encounter (Signed)
Spoke with the patient to see how he is taking his Norco the patient reports he is taking 1/2 in the AM and evening. The pharmacy reports the script is written 1 tab po Q 4-6 hrs prn. MD has been made aware

## 2020-09-04 NOTE — Telephone Encounter (Signed)
Patient called Dr Maisie Fus office asking for pain medicine refill and Dr Kathyrn Sheriff prefers that our office handle this request.

## 2020-09-05 ENCOUNTER — Other Ambulatory Visit: Payer: Self-pay | Admitting: Hematology and Oncology

## 2020-09-05 DIAGNOSIS — C119 Malignant neoplasm of nasopharynx, unspecified: Secondary | ICD-10-CM

## 2020-09-05 MED ORDER — HYDROCODONE-ACETAMINOPHEN 10-325 MG PO TABS: 0.5000 | ORAL_TABLET | ORAL | 0 refills | Status: DC | PRN

## 2020-09-07 ENCOUNTER — Ambulatory Visit: Admission: RE | Admit: 2020-09-07 | Payer: Medicare Other | Source: Ambulatory Visit

## 2020-09-07 NOTE — Progress Notes (Signed)
[facility]  [address] [place], [place] [address] Phone: [phone]  Fax: [phone]   Clinic Day:  09/08/2020  Referring physician: Verita Lamb, NP  Chief Complaint: Kenneth Franklin is a 74 y.o. male with stage III nasopharyngeal carcinoma who is seen for assessment prior to week #1 cisplatin and concurrent radiation.  HPI: The patient was last seen in the oncology clinic on 08/18/2020 for new patient assessment. At that time, he had headaches and felt congested. Hematocrit was 45.1, hemoglobin 14.8, platelets 254,000, WBC 10,600 (ANC 8,200). Bilirubin was 1.5 (direct 0.3).  We discussed treatment with cisplatin + radiation followed by gemcitabine and cisplatin.  We discussed staging PET scan.  PET scan on 08/18/2020 revealed a 5.7 x 4.2 cm destructive hypermetabolic mass (SUV 62.83) involving the sphenoid sinus, skull base and posterior nasopharynx. This was consistent with patient's known squamous cell carcinoma. There was no locoregional lymphadenopathy or distant metastatic disease.  Tumor Board on 08/20/2020 recommended neoadjuvant chemotherapy + concurrent chemotherapy and radiation.  Port-a-cath was placed on 08/24/2020 by Dr. Lucky Cowboy.  The patient was seen in consultation by Dr Baruch Gouty on 08/26/2020.  Plan was for concurrent chemoradiation delivering 7000 cGy to his nasopharynx. Bilateral neck nodes would be treated up to 5400 cGy for possible microscopic involvement up to 5400 cGy using IMRT dose painting technique. He underwent CT simulation on 08/27/2020.  Radiation began this morning.   During the interim, he has been good. He attended chemotherapy class but does not think he was given any nausea medication. He did not use EMLA cream today. His shortness of breath, congestion, and headaches are stable. He is not unsteady on his feet. His sore throat has resolved.  The patient lives alone and controls his medications. His step daughter  comes by and checks on him everyday.  His diabetes is managed by Verita Lamb, NP in [place].  The patient is ready to begin treatment today.   Past Medical History:  Diagnosis Date  . Acute ischemic stroke ([place]) 2017  . Alcohol abuse   . Anxiety   . Cancer ([place])   . Depression   . Diabetes mellitus without complication ([place])   . Dyspnea    pcp knows and ordered rescue inhaler  . Hyperlipidemia   . Hypertension   . Skin cancer    Squamous Cell Carcinoma In Situ    Past Surgical History:  Procedure Laterality Date  . CATARACT EXTRACTION Bilateral   . COLONOSCOPY    . NASOPHARYNGOSCOPY N/A 08/12/2020   Procedure: ENDOSCOPIC NASOPHARYNGOSCOPY WITH BIOPSY;  Surgeon: Margaretha Sheffield, MD;  Location: [facility] ORS;  Service: ENT;  Laterality: N/A;  . PORTA CATH INSERTION N/A 08/24/2020   Procedure: PORTA CATH INSERTION;  Surgeon: Algernon Huxley, MD;  Location: [facility];  Service: Cardiovascular;  Laterality: N/A;    Family History  Problem Relation Age of Onset  . Diabetes Brother     Social History:  reports that he quit smoking about 12 months ago. His smoking use included cigars and cigarettes. He has a 40.00 pack-year smoking history. He has never used smokeless tobacco. He reports previous alcohol use. He reports that he does not use drugs. He smoked 1 pack/day x 40 years.  He denies exposure of radiation or toxins; he did work with asphalt. He lives alone in [place]. He is widowed. The patient is alone today. His brother came in for the last few minutes of the visit.  Allergies:  Allergies  Allergen Reactions  . Propoxyphene     Unknown reaction    Current Medications: Current Outpatient Medications  Medication Sig Dispense Refill  . ALPRAZolam (XANAX) 0.25 MG tablet TAKE 1 TABLET EVERY DAY AS NEEDED FOR ANXIETY (Patient taking differently: Take 0.25 mg by mouth daily as needed for anxiety. ) 30 tablet 2  . aspirin EC 81 MG tablet Take 81 mg by mouth daily. Swallow  whole.    Marland Kitchen atorvastatin (LIPITOR) 80 MG tablet TAKE 1 TABLET (80 MG TOTAL) BY MOUTH NIGHTLY. (Patient taking differently: Take 80 mg by mouth every evening. ) 90 tablet 0  . busPIRone (BUSPAR) 10 MG tablet TAKE 1 TABLET (10 MG TOTAL) BY MOUTH 2 (TWO) TIMES DAILY. 180 tablet 0  . clopidogrel (PLAVIX) 75 MG tablet TAKE 1 TABLET (75 MG TOTAL) BY MOUTH DAILY. 90 tablet 0  . DENTA 5000 PLUS 1.1 % CREA dental cream Place 1 application onto teeth at bedtime.     Marland Kitchen escitalopram (LEXAPRO) 20 MG tablet Take 20 mg by mouth every morning.     . fluticasone (FLOVENT HFA) 110 MCG/ACT inhaler Inhale 2 puffs into the lungs daily as needed (shortness of breath).    Marland Kitchen glipiZIDE (GLUCOTROL) 5 MG tablet Take 5 mg by mouth daily before breakfast.     . HYDROcodone-acetaminophen (NORCO) 10-325 MG tablet Take 0.5 tablets by mouth every 4 (four) hours as needed for severe pain. 30 tablet 0  . lidocaine-prilocaine (EMLA) cream Apply 1 application topically as needed. Apply small amount to port site at least 1 hour prior to it being accessed, cover with plastic wrap 30 g 1  . losartan (COZAAR) 100 MG tablet TAKE 1 TABLET (100 MG TOTAL) BY MOUTH DAILY. (Patient taking differently: Take 100 mg by mouth every morning. ) 90 tablet 0  . metFORMIN (GLUCOPHAGE) 500 MG tablet Take 1 tablet (500 mg total) by mouth daily with breakfast. 90 tablet 0  . mirtazapine (REMERON) 30 MG tablet Take 1 tablet (30 mg total) by mouth at bedtime. (Patient taking differently: Take 15 mg by mouth at bedtime. ) 30 tablet 0  . nystatin cream (MYCOSTATIN) Apply 1 application topically 2 (two) times daily as needed (yeast).     . pantoprazole (PROTONIX) 20 MG tablet Take 20 mg by mouth daily before lunch.     . sitaGLIPtin (JANUVIA) 100 MG tablet Take 100 mg by mouth every morning.     Marland Kitchen VITAMIN D PO Take 1 capsule by mouth daily.    Marland Kitchen escitalopram (LEXAPRO) 10 MG tablet Take 1 tablet (10 mg total) by mouth daily. Stop your lexapro in a week 6 tablet 0   . sucralfate (CARAFATE) 1 g tablet Take 1 tablet (1 g total) by mouth 4 (four) times daily. 120 tablet 11   No current facility-administered medications for this visit.    Review of Systems  Constitutional: Negative for chills, diaphoresis, fever, malaise/fatigue and weight loss (up 5 lbs).       Feels "alright".  HENT: Positive for congestion. Negative for ear discharge, ear pain, hearing loss, nosebleeds, sinus pain, sore throat and tinnitus.   Eyes: Negative for blurred vision.  Respiratory: Positive for shortness of breath (on exertion). Negative for cough, hemoptysis and sputum production.   Cardiovascular: Negative.  Negative for chest pain, palpitations and leg swelling.  Gastrointestinal: Negative for abdominal pain, blood in stool, constipation, diarrhea, heartburn, melena, nausea and vomiting.  Genitourinary: Negative.  Negative for dysuria, frequency, hematuria and urgency.  Musculoskeletal: Negative.  Negative for back pain, joint pain, myalgias and neck pain.  Skin: Negative for itching and rash.  Neurological: Positive for headaches. Negative for dizziness, tingling, sensory change and weakness.  Endo/Heme/Allergies: Negative.  Does not bruise/bleed easily.  Psychiatric/Behavioral: Negative.  Negative for depression and memory loss. The patient is not nervous/anxious and does not have insomnia.   All other systems reviewed and are negative.  Performance status (ECOG): 1  Vitals Blood pressure (!) 153/80, pulse 76, temperature (!) 96.9 F (36.1 C), temperature source Tympanic, resp. rate 18, weight 180 lb (81.6 kg), SpO2 97 %.   Physical Exam Vitals and nursing note reviewed.  Constitutional:      General: He is not in acute distress.    Appearance: Normal appearance. He is well-developed.     Interventions: Face mask in place.  HENT:     Head: Normocephalic and atraumatic.     Comments: Short gray hair.    Mouth/Throat:     Mouth: Mucous membranes are moist. No  oral lesions.     Pharynx: Oropharynx is clear.  Eyes:     General: No scleral icterus.    Extraocular Movements: Extraocular movements intact.     Conjunctiva/sclera: Conjunctivae normal.     Pupils: Pupils are equal, round, and reactive to light.     Comments: Blue eyes.  Cardiovascular:     Rate and Rhythm: Normal rate and regular rhythm.     Heart sounds: Normal heart sounds. No murmur heard.  No friction rub. No gallop.   Pulmonary:     Effort: Pulmonary effort is normal.     Breath sounds: Normal breath sounds. No wheezing, rhonchi or rales.  Abdominal:     General: Bowel sounds are normal. There is no distension.     Palpations: Abdomen is soft. There is no hepatomegaly, splenomegaly or mass.     Tenderness: There is no abdominal tenderness. There is no guarding or rebound.  Musculoskeletal:        General: No tenderness. Normal range of motion.     Cervical back: Normal range of motion and neck supple.  Lymphadenopathy:     Head:     Right side of head: No preauricular, posterior auricular or occipital adenopathy.     Left side of head: No preauricular, posterior auricular or occipital adenopathy.     Cervical: No cervical adenopathy.     Upper Body:     Right upper body: No supraclavicular or axillary adenopathy.     Left upper body: No supraclavicular or axillary adenopathy.     Lower Body: No right inguinal adenopathy. No left inguinal adenopathy.  Skin:    General: Skin is warm and dry.     Findings: No bruising, erythema, lesion or rash.  Neurological:     Mental Status: He is alert and oriented to person, place, and time.  Psychiatric:        Behavior: Behavior normal.        Thought Content: Thought content normal.        Judgment: Judgment normal.    No visits with results within 3 Day(s) from this visit.  Latest known visit with results is:  Admission on 08/12/2020, Discharged on 08/12/2020  Component Date Value Ref Range Status  . Sodium 08/12/2020 132*  135 - 145 mmol/L Final  . Potassium 08/12/2020 4.2  3.5 - 5.1 mmol/L Final  . Chloride 08/12/2020 96* 98 - 111 mmol/L Final  . CO2 08/12/2020 27  22 - 32 mmol/L Final  . Glucose, Bld 08/12/2020 374* 70 - 99 mg/dL Final   Glucose reference range applies only to samples taken after fasting for at least 8 hours.  . BUN 08/12/2020 7* 8 - 23 mg/dL Final  . Creatinine, Ser 08/12/2020 0.84  0.61 - 1.24 mg/dL Final  . Calcium 08/12/2020 8.5* 8.9 - 10.3 mg/dL Final  . GFR calc non Af Amer 08/12/2020 >60  >60 mL/min Final  . GFR calc Af Amer 08/12/2020 >60  >60 mL/min Final  . Anion gap 08/12/2020 9  5 - 15 Final   Performed at [facility], [address] [place], [place] [address]  . WBC 08/12/2020 5.5  4.0 - 10.5 K/uL Final  . RBC 08/12/2020 4.46  4.22 - 5.81 MIL/uL Final  . Hemoglobin 08/12/2020 13.2  13.0 - 17.0 g/dL Final  . HCT 08/12/2020 39.3  39 - 52 % Final  . MCV 08/12/2020 88.1  80.0 - 100.0 fL Final  . MCH 08/12/2020 29.6  26.0 - 34.0 pg Final  . MCHC 08/12/2020 33.6  30.0 - 36.0 g/dL Final  . RDW 08/12/2020 12.6  11.5 - 15.5 % Final  . Platelets 08/12/2020 178  150 - 400 K/uL Final  . nRBC 08/12/2020 0.0  0.0 - 0.2 % Final   Performed at [facility], [address] [place], [place] [address]  . Glucose-Capillary 08/12/2020 368* 70 - 99 mg/dL Final   Glucose reference range applies only to samples taken after fasting for at least 8 hours.  . Glucose-Capillary 08/12/2020 333* 70 - 99 mg/dL Final   Glucose reference range applies only to samples taken after fasting for at least 8 hours.  . SURGICAL PATHOLOGY 08/12/2020    Final-Edited                   Value:SURGICAL PATHOLOGY CASE: [id number] PATIENT: Kenneth Franklin Surgical Pathology Report  Specimen Submitted: A. Nasopharyngeal tumor  Clinical History: Malignant neoplasm nasopharynx, sphenoid sinus.  DIAGNOSIS: A. NASOPHARYNGEAL TUMOR; BIOPSY: - KERATINIZING SQUAMOUS CELL CARCINOMA, MODERATELY  DIFFERENTIATED.  GROSS DESCRIPTION: Intraoperative Consultation:     Labeled: Nasopharyngeal tumor     Received: Fresh     Specimen: Nasopharyngoscopy with biopsy     Pathologic evaluation performed: Frozen section diagnosis     Diagnosis: FSA, representative section: Squamous cell carcinoma     Communicated to: Called to Dr. Kathyrn Sheriff at 11:36 AM on 08/12/2020 Quay Burow M.D.     Tissue submitted: A touch preparation with 1 Diff-Quik stained slide is performed.  Additionally, representative sections of the specimen are frozen as FSA1 and 3 frozen section slides are performed.  A. Labeled: Nasopharyngeal tumor Received: Fresh Tissue fragment(s): Multiple                          Size: Aggregate, 1.4 x 1.4 x 0.4 cm Description: Received fresh on a Telfa pad are fragments of pink soft tissue and blood clot.  A touch preparation with 1 Diff-Quik stained slide is performed.  Representative sections are frozen as FSA1 with 3 frozen section slides performed.  The frozen section remnant is submitted in cassette 1 and the remainder of the specimen is submitted in cassette 2.  Final Diagnosis performed by Quay Burow, MD.   Electronically signed 08/13/2020 9:25:55AM The electronic signature indicates that the named Attending Pathologist has evaluated the specimen Technical component performed at [facility], [address] [place], [place] [address] Lab:  [phone] Dir: Rush Farmer, MD, MMM  Professional component performed at [facility], [facility], [place], [place], [place] [address] Lab: [phone] Dir: Dellia Nims. Rubinas, MD  . Glucose-Capillary 08/12/2020 275* 70 - 99 mg/dL Final   Glucose reference range applies only to samples taken after fasting for at least 8 hours.  . Glucose-Capillary 08/12/2020 246* 70 - 99 mg/dL Final   Glucose reference range applies only to samples taken after fasting for at least 8 hours.    Assessment:  Kenneth Franklin is a 74  y.o. 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  Pathology revealed keratinizing squamous cell carcinoma, moderately differentiated.   Head MRI on 08/13/2020 revealed a 5.9 cm nasopharyngeal mass consistent with known nasopharyngeal carcinoma with involvement of the clivus and sphenoid sinus. There was moderate chronic small vessel ischemic disease and cerebral atrophy.  There was no intracranial extension.  PET scan on 08/18/2020 revealed a 5.7 x 4.2 cm destructive hypermetabolic mass (SUV 50.35) involving the sphenoid sinus, skull base and posterior nasopharynx.  This was consistent with patient's known squamous cell carcinoma. There was no locoregional lymphadenopathy or distant metastatic disease.  Radiation began today.  Symptomatically,  he feels good. Shortness of breath, congestion, and headaches are stable.  His sore throat has resolved.  Exam is stable.   Plan: 1.   Labs today: CBC with diff, CMP, Mg. 2.   Clinical [id number] nasopharyngeal carcinoma  Tumor is unresectable.  Review PET scan from 08/18/2020.  Images personally reviewed.  Agree with radiology interpretation.     No evidence of local adenopathy or metastatic disease.    Discuss plan for upfront radiation and chemotherapy followed by adjuvant chemotherapy   Cisplatin 40 mg/m2 weekly x 6 with radiation followed by cisplatin 80 mg/m2 D1 + 5FU 1000 mg/m2/day CI D1-4 q 28 days x 3 cycles.   Review potential side effects associated with chemotherapy including myelosuppression, nausea, vomiting, high-frequency hearing loss, and electrolyte wasting.   Patient consented to treatment.   Anti-emetic prescriptions provided:  Decadron, Compazine, ondansetron.  Reschedule baseline audiogram pre: cisplatin.  Labs reviewed.  Week #1 cisplatin with radiation. 3.   Hypomagnesemia  Magnesium 1.6.  Magnesium 2 gm IV. 4.   Week #1 cisplatin. 5.   RN to call patient after clinic/tomorrow to ensure he  understanding of nausea medications. 6.   RN: Andris Flurry, NP to monitor blood sugar on Decadron (patient has diabetes). 7.   Audiogram this week. 8.   RTC in 1 week for MD assessment, labs (CBC with diff, CMP, Mg), and week #2 cisplatin +/- Mg.   I discussed the assessment and treatment plan with the patient.  The patient was provided an opportunity to ask questions and all were answered.  The patient agreed with the plan and demonstrated an understanding of the instructions.  The patient was advised to call back if the symptoms worsen or if the condition fails to improve as anticipated.   Mariavictoria Nottingham C. Mike Gip, MD, PhD    09/08/2020, 9:19 AM  I, Mirian Mo Tufford, am acting as Education administrator for [employer]. Mike Gip, MD, PhD.  I, Jillisa Harris C. Mike Gip, MD, have reviewed the above documentation for accuracy and completeness, and I agree with the above.

## 2020-09-08 ENCOUNTER — Ambulatory Visit
Admission: RE | Admit: 2020-09-08 | Discharge: 2020-09-08 | Disposition: A | Discharge status: Home / Self Care | Payer: Medicare Other | Source: Ambulatory Visit | Attending: Radiation Oncology | Admitting: Radiation Oncology

## 2020-09-08 ENCOUNTER — Ambulatory Visit: Payer: Medicare Other

## 2020-09-08 ENCOUNTER — Other Ambulatory Visit: Payer: Self-pay

## 2020-09-08 ENCOUNTER — Inpatient Hospital Stay: Payer: Medicare Other | Attending: Hematology and Oncology

## 2020-09-08 ENCOUNTER — Inpatient Hospital Stay: Payer: Medicare Other

## 2020-09-08 ENCOUNTER — Inpatient Hospital Stay (HOSPITAL_BASED_OUTPATIENT_CLINIC_OR_DEPARTMENT_OTHER): Payer: Medicare Other | Admitting: Hematology and Oncology

## 2020-09-08 VITALS — BP 153/80 | HR 76 | Temp 96.9°F | Resp 18 | Wt 180.0 lb

## 2020-09-08 DIAGNOSIS — Z7189 Other specified counseling: Secondary | ICD-10-CM

## 2020-09-08 DIAGNOSIS — Z79899 Other long term (current) drug therapy: Secondary | ICD-10-CM | POA: Diagnosis not present

## 2020-09-08 DIAGNOSIS — Z5111 Encounter for antineoplastic chemotherapy: Secondary | ICD-10-CM | POA: Insufficient documentation

## 2020-09-08 DIAGNOSIS — C119 Malignant neoplasm of nasopharynx, unspecified: Secondary | ICD-10-CM | POA: Diagnosis present

## 2020-09-08 LAB — CBC WITH DIFFERENTIAL/PLATELET
Abs Immature Granulocytes: 0.05 10*3/uL (ref 0.00–0.07)
Basophils Absolute: 0 10*3/uL (ref 0.0–0.1)
Basophils Relative: 1 %
Eosinophils Absolute: 0.1 10*3/uL (ref 0.0–0.5)
Eosinophils Relative: 2 %
HCT: 40.2 % (ref 39.0–52.0)
Hemoglobin: 12.9 g/dL — ABNORMAL LOW (ref 13.0–17.0)
Immature Granulocytes: 1 %
Lymphocytes Relative: 23 %
Lymphs Abs: 1.2 10*3/uL (ref 0.7–4.0)
MCH: 28.9 pg (ref 26.0–34.0)
MCHC: 32.1 g/dL (ref 30.0–36.0)
MCV: 90.1 fL (ref 80.0–100.0)
Monocytes Absolute: 0.4 10*3/uL (ref 0.1–1.0)
Monocytes Relative: 7 %
Neutro Abs: 3.4 10*3/uL (ref 1.7–7.7)
Neutrophils Relative %: 66 %
Platelets: 217 10*3/uL (ref 150–400)
RBC: 4.46 MIL/uL (ref 4.22–5.81)
RDW: 12.9 % (ref 11.5–15.5)
WBC: 5.2 10*3/uL (ref 4.0–10.5)
nRBC: 0 % (ref 0.0–0.2)

## 2020-09-08 LAB — COMPREHENSIVE METABOLIC PANEL
ALT: 21 U/L (ref 0–44)
AST: 27 U/L (ref 15–41)
Albumin: 3.5 g/dL (ref 3.5–5.0)
Alkaline Phosphatase: 108 U/L (ref 38–126)
Anion gap: 8 (ref 5–15)
BUN: 6 mg/dL — ABNORMAL LOW (ref 8–23)
CO2: 28 mmol/L (ref 22–32)
Calcium: 8.7 mg/dL — ABNORMAL LOW (ref 8.9–10.3)
Chloride: 97 mmol/L — ABNORMAL LOW (ref 98–111)
Creatinine, Ser: 0.83 mg/dL (ref 0.61–1.24)
GFR, Estimated: >60 mL/min (ref >60)
Glucose, Bld: 232 mg/dL — ABNORMAL HIGH (ref 70–99)
Potassium: 3.8 mmol/L (ref 3.5–5.1)
Sodium: 133 mmol/L — ABNORMAL LOW (ref 135–145)
Total Bilirubin: 0.9 mg/dL (ref 0.3–1.2)
Total Protein: 7.2 g/dL (ref 6.5–8.1)

## 2020-09-08 LAB — MAGNESIUM: Magnesium: 1.6 mg/dL — ABNORMAL LOW (ref 1.7–2.4)

## 2020-09-08 MED ORDER — SODIUM CHLORIDE 0.9 % IV SOLN
Freq: Once | INTRAVENOUS | Status: AC
  Filled 2020-09-08: qty 250

## 2020-09-08 MED ORDER — SODIUM CHLORIDE 0.9 % IV SOLN
40.0000 mg/m2 | Freq: Once | INTRAVENOUS | Status: AC
  Administered 2020-09-08: 80 mg via INTRAVENOUS
  Filled 2020-09-08: qty 80

## 2020-09-08 MED ORDER — HEPARIN SOD (PORK) LOCK FLUSH 100 UNIT/ML IV SOLN
INTRAVENOUS | Status: AC
  Filled 2020-09-08: qty 5

## 2020-09-08 MED ORDER — ONDANSETRON HCL 8 MG PO TABS: 8.0000 mg | ORAL_TABLET | Freq: Two times a day (BID) | ORAL | 1 refills | Status: DC | PRN

## 2020-09-08 MED ORDER — PALONOSETRON HCL INJECTION 0.25 MG/5ML
0.2500 mg | Freq: Once | INTRAVENOUS | Status: AC
  Administered 2020-09-08: 0.25 mg via INTRAVENOUS
  Filled 2020-09-08: qty 5

## 2020-09-08 MED ORDER — DEXAMETHASONE 4 MG PO TABS: 8.0000 mg | ORAL_TABLET | Freq: Every day | ORAL | 1 refills | Status: DC

## 2020-09-08 MED ORDER — SODIUM CHLORIDE 0.9 % IV SOLN
Freq: Once | INTRAVENOUS | Status: AC
  Filled 2020-09-08: qty 10

## 2020-09-08 MED ORDER — SODIUM CHLORIDE 0.9 % IV SOLN
150.0000 mg | Freq: Once | INTRAVENOUS | Status: AC
  Administered 2020-09-08: 150 mg via INTRAVENOUS
  Filled 2020-09-08: qty 5

## 2020-09-08 MED ORDER — SODIUM CHLORIDE 0.9 % IV SOLN
10.0000 mg | Freq: Once | INTRAVENOUS | Status: AC
  Administered 2020-09-08: 10 mg via INTRAVENOUS
  Filled 2020-09-08: qty 10

## 2020-09-08 MED ORDER — HEPARIN SOD (PORK) LOCK FLUSH 100 UNIT/ML IV SOLN
500.0000 [IU] | Freq: Once | INTRAVENOUS | Status: AC | PRN
  Administered 2020-09-08: 500 [IU]
  Filled 2020-09-08: qty 5

## 2020-09-08 MED ORDER — PROCHLORPERAZINE MALEATE 10 MG PO TABS: 10.0000 mg | ORAL_TABLET | Freq: Four times a day (QID) | ORAL | 1 refills | Status: DC | PRN

## 2020-09-08 NOTE — Progress Notes (Signed)
Patient reports frequent headaches, nausea in the mornings, and bilateral foot neuropathy.

## 2020-09-09 ENCOUNTER — Telehealth: Payer: Self-pay

## 2020-09-09 ENCOUNTER — Ambulatory Visit: Payer: Medicare Other

## 2020-09-09 ENCOUNTER — Ambulatory Visit
Admission: RE | Admit: 2020-09-09 | Discharge: 2020-09-09 | Disposition: A | Discharge status: Home / Self Care | Payer: Medicare Other | Source: Ambulatory Visit | Attending: Radiation Oncology | Admitting: Radiation Oncology

## 2020-09-09 ENCOUNTER — Encounter: Payer: Self-pay | Admitting: Otolaryngology

## 2020-09-09 DIAGNOSIS — C119 Malignant neoplasm of nasopharynx, unspecified: Secondary | ICD-10-CM | POA: Diagnosis not present

## 2020-09-09 NOTE — Telephone Encounter (Signed)
T/C to pt for follow up after 1st chemo received yesterday.   No answer but left message letting pt know we were calling to check on him and to call for any questions or concerns.

## 2020-09-10 ENCOUNTER — Ambulatory Visit
Admission: RE | Admit: 2020-09-10 | Discharge: 2020-09-10 | Disposition: A | Discharge status: Home / Self Care | Payer: Medicare Other | Source: Ambulatory Visit | Attending: Radiation Oncology | Admitting: Radiation Oncology

## 2020-09-10 ENCOUNTER — Ambulatory Visit: Payer: Medicare Other

## 2020-09-10 DIAGNOSIS — C119 Malignant neoplasm of nasopharynx, unspecified: Secondary | ICD-10-CM | POA: Diagnosis not present

## 2020-09-11 ENCOUNTER — Ambulatory Visit: Payer: Medicare Other

## 2020-09-11 ENCOUNTER — Ambulatory Visit
Admission: RE | Admit: 2020-09-11 | Discharge: 2020-09-11 | Disposition: A | Discharge status: Home / Self Care | Payer: Medicare Other | Source: Ambulatory Visit | Attending: Radiation Oncology | Admitting: Radiation Oncology

## 2020-09-11 ENCOUNTER — Telehealth: Payer: Self-pay

## 2020-09-11 DIAGNOSIS — C119 Malignant neoplasm of nasopharynx, unspecified: Secondary | ICD-10-CM | POA: Diagnosis not present

## 2020-09-11 NOTE — Telephone Encounter (Signed)
Spoke with the patient PCP office to inform them, Per Dr Mike Gip she would like for the PCP to follow the patient closely with treatment. MA ( Joe) due to his diabetes.

## 2020-09-14 ENCOUNTER — Ambulatory Visit
Admission: RE | Admit: 2020-09-14 | Discharge: 2020-09-14 | Disposition: A | Discharge status: Home / Self Care | Payer: Medicare Other | Source: Ambulatory Visit | Attending: Radiation Oncology | Admitting: Radiation Oncology

## 2020-09-14 ENCOUNTER — Ambulatory Visit: Payer: Medicare Other

## 2020-09-14 DIAGNOSIS — C119 Malignant neoplasm of nasopharynx, unspecified: Secondary | ICD-10-CM | POA: Diagnosis not present

## 2020-09-14 NOTE — Progress Notes (Signed)
[facility]  [address] [place], [place] [address] Phone: [phone]  Fax: [phone]   Clinic Day:  09/15/2020  Referring physician: Verita Lamb, NP  Chief Complaint: Kenneth Franklin is a 74 y.o. male with stage III nasopharyngeal carcinoma who is seen for assessment prior to week #2 cisplatin and concurrent radiation.  HPI: The patient was last seen in the oncology clinic on 09/08/2020 for new patient assessment. At that time, he felt good.  He denied any new symptoms.  Hematocrit was 40.2, hemoglobin 12.9, platelets 217,000, WBC 5,200. Sodium was 133. Glucose was 232. BUN was 6. Calcium was 8.7. Magnesium was 1.6. He received week #1 cisplatin.  In addition, he received potassium 20 mEq IV and magnesium 2 gm IV.  The patient began IMRT treatment on 09/08/2020. He has received 6 treatments to date.  The patient underwent audiology evaluation on 09/09/2020.  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  During the interim, he has felt "drained." He has had some mild nausea but has not taken medication for it. He has headaches and takes medicine only if needed.  He only took Decadron once after chemo. He was confused about how to take it. He has had no problems with his port.   Past Medical History:  Diagnosis Date  . Acute ischemic stroke ([place]) 2017  . Alcohol abuse   . Anxiety   . Cancer ([place])   . Depression   . Diabetes mellitus without complication ([place])   . Dyspnea    pcp knows and ordered rescue inhaler  . Hyperlipidemia   . Hypertension   . Skin cancer    Squamous Cell Carcinoma In Situ    Past Surgical History:  Procedure Laterality Date  . CATARACT EXTRACTION Bilateral   . COLONOSCOPY    . NASOPHARYNGOSCOPY N/A 08/12/2020   Procedure: ENDOSCOPIC NASOPHARYNGOSCOPY WITH BIOPSY;  Surgeon: Margaretha Sheffield, MD;  Location: [facility] ORS;   Service: ENT;  Laterality: N/A;  . PORTA CATH INSERTION N/A 08/24/2020   Procedure: PORTA CATH INSERTION;  Surgeon: Algernon Huxley, MD;  Location: [facility];  Service: Cardiovascular;  Laterality: N/A;    Family History  Problem Relation Age of Onset  . Diabetes Brother     Social History:  reports that he quit smoking about 13 months ago. His smoking use included cigars and cigarettes. He has a 40.00 pack-year smoking history. He has never used smokeless tobacco. He reports previous alcohol use. He reports that he does not use drugs. He smoked 1 pack/day x 40 years.  He denies exposure of radiation or toxins; he did work with asphalt. He lives alone in [place]. He is widowed. The patient is alone today.  Allergies:  Allergies  Allergen Reactions  . Propoxyphene     Unknown reaction    Current Medications: Current Outpatient Medications  Medication Sig Dispense Refill  . ALPRAZolam (XANAX) 0.25 MG tablet TAKE 1 TABLET EVERY DAY AS NEEDED FOR ANXIETY (Patient taking differently: Take 0.25 mg by mouth daily as needed for anxiety. ) 30 tablet 2  . aspirin EC 81 MG tablet Take 81 mg by mouth daily. Swallow whole.    Marland Kitchen atorvastatin (LIPITOR) 80 MG tablet TAKE 1 TABLET (80 MG TOTAL) BY MOUTH NIGHTLY. (Patient taking differently: Take 80 mg by mouth every evening. ) 90 tablet 0  . busPIRone (BUSPAR) 10 MG tablet  TAKE 1 TABLET (10 MG TOTAL) BY MOUTH 2 (TWO) TIMES DAILY. 180 tablet 0  . clopidogrel (PLAVIX) 75 MG tablet TAKE 1 TABLET (75 MG TOTAL) BY MOUTH DAILY. 90 tablet 0  . DENTA 5000 PLUS 1.1 % CREA dental cream Place 1 application onto teeth at bedtime.     Marland Kitchen dexamethasone (DECADRON) 4 MG tablet Take 2 tablets (8 mg total) by mouth daily. Take daily x 3 days starting the day after cisplatin chemotherapy. Take with food. 30 tablet 1  . escitalopram (LEXAPRO) 20 MG tablet Take 20 mg by mouth every morning.     . fluticasone (FLOVENT HFA) 110 MCG/ACT inhaler Inhale 2 puffs into the  lungs daily as needed (shortness of breath).    Marland Kitchen glipiZIDE (GLUCOTROL) 5 MG tablet Take 5 mg by mouth daily before breakfast.     . HYDROcodone-acetaminophen (NORCO) 10-325 MG tablet Take 0.5 tablets by mouth every 4 (four) hours as needed for severe pain. 30 tablet 0  . lidocaine-prilocaine (EMLA) cream Apply 1 application topically as needed. Apply small amount to port site at least 1 hour prior to it being accessed, cover with plastic wrap 30 g 1  . losartan (COZAAR) 100 MG tablet TAKE 1 TABLET (100 MG TOTAL) BY MOUTH DAILY. (Patient taking differently: Take 100 mg by mouth every morning. ) 90 tablet 0  . metFORMIN (GLUCOPHAGE) 500 MG tablet Take 1 tablet (500 mg total) by mouth daily with breakfast. 90 tablet 0  . mirtazapine (REMERON) 30 MG tablet Take 1 tablet (30 mg total) by mouth at bedtime. (Patient taking differently: Take 15 mg by mouth at bedtime. ) 30 tablet 0  . nystatin cream (MYCOSTATIN) Apply 1 application topically 2 (two) times daily as needed (yeast).     . ondansetron (ZOFRAN) 8 MG tablet Take 1 tablet (8 mg total) by mouth 2 (two) times daily as needed. Start on the third day after cisplatin chemotherapy. 30 tablet 1  . pantoprazole (PROTONIX) 20 MG tablet Take 20 mg by mouth daily before lunch.     . prochlorperazine (COMPAZINE) 10 MG tablet Take 1 tablet (10 mg total) by mouth every 6 (six) hours as needed (Nausea or vomiting). 30 tablet 1  . sitaGLIPtin (JANUVIA) 100 MG tablet Take 100 mg by mouth every morning.     Marland Kitchen VITAMIN D PO Take 1 capsule by mouth daily.    Marland Kitchen escitalopram (LEXAPRO) 10 MG tablet Take 1 tablet (10 mg total) by mouth daily. Stop your lexapro in a week 6 tablet 0  . sucralfate (CARAFATE) 1 g tablet Take 1 tablet (1 g total) by mouth 4 (four) times daily. 120 tablet 11   No current facility-administered medications for this visit.    Review of Systems  Constitutional: Positive for weight loss (6 lbs). Negative for chills, diaphoresis, fever and  malaise/fatigue.       Feels "drained."  HENT: Negative for congestion, ear discharge, ear pain, hearing loss, nosebleeds, sinus pain, sore throat and tinnitus.   Eyes: Negative for blurred vision.  Respiratory: Positive for shortness of breath (on exertion). Negative for cough, hemoptysis and sputum production.   Cardiovascular: Negative.  Negative for chest pain, palpitations and leg swelling.  Gastrointestinal: Positive for nausea. Negative for abdominal pain, blood in stool, constipation, diarrhea, heartburn, melena and vomiting.  Genitourinary: Negative.  Negative for dysuria, frequency, hematuria and urgency.  Musculoskeletal: Negative.  Negative for back pain, joint pain, myalgias and neck pain.  Skin: Negative for itching and rash.  Neurological: Positive for headaches. Negative for dizziness, tingling, sensory change and weakness.  Endo/Heme/Allergies: Negative.  Does not bruise/bleed easily.  Psychiatric/Behavioral: Negative.  Negative for depression and memory loss. The patient is not nervous/anxious and does not have insomnia.   All other systems reviewed and are negative.  Performance status (ECOG): 1  Vitals Blood pressure 120/71, pulse 92, temperature 97.7 F (36.5 C), temperature source Tympanic, resp. rate 18, weight 174 lb (78.9 kg), SpO2 100 %.   Physical Exam Vitals and nursing note reviewed.  Constitutional:      General: He is not in acute distress.    Appearance: Normal appearance. He is well-developed.     Interventions: Face mask in place.  HENT:     Head: Normocephalic and atraumatic.     Comments: Cap.  Gray hair.    Right Ear: Hearing normal.     Left Ear: Hearing normal.     Mouth/Throat:     Mouth: Mucous membranes are moist. No oral lesions.     Pharynx: Oropharynx is clear.  Eyes:     General: No scleral icterus.    Extraocular Movements: Extraocular movements intact.     Conjunctiva/sclera: Conjunctivae normal.     Pupils: Pupils are equal,  round, and reactive to light.     Comments: Blue eyes.  Cardiovascular:     Rate and Rhythm: Normal rate and regular rhythm.     Heart sounds: Normal heart sounds. No murmur heard.  No friction rub. No gallop.   Pulmonary:     Effort: Pulmonary effort is normal.     Breath sounds: Normal breath sounds. No wheezing, rhonchi or rales.  Abdominal:     General: Bowel sounds are normal. There is no distension.     Palpations: Abdomen is soft. There is no hepatomegaly, splenomegaly or mass.     Tenderness: There is no abdominal tenderness. There is no guarding or rebound.  Musculoskeletal:        General: No tenderness. Normal range of motion.     Cervical back: Normal range of motion and neck supple.  Lymphadenopathy:     Head:     Right side of head: No preauricular, posterior auricular or occipital adenopathy.     Left side of head: No preauricular, posterior auricular or occipital adenopathy.     Cervical: No cervical adenopathy.     Upper Body:     Right upper body: No supraclavicular or axillary adenopathy.     Left upper body: No supraclavicular or axillary adenopathy.     Lower Body: No right inguinal adenopathy. No left inguinal adenopathy.  Skin:    General: Skin is warm and dry.     Findings: No bruising, erythema, lesion or rash.  Neurological:     Mental Status: He is alert and oriented to person, place, and time.  Psychiatric:        Behavior: Behavior normal.        Thought Content: Thought content normal.        Judgment: Judgment normal.    No visits with results within 3 Day(s) from this visit.  Latest known visit with results is:  Admission on 08/12/2020, Discharged on 08/12/2020  Component Date Value Ref Range Status  . Sodium 08/12/2020 132* 135 - 145 mmol/L Final  . Potassium 08/12/2020 4.2  3.5 - 5.1 mmol/L Final  . Chloride 08/12/2020 96* 98 - 111 mmol/L Final  . CO2 08/12/2020 27  22 - 32 mmol/L Final  .  Glucose, Bld 08/12/2020 374* 70 - 99 mg/dL Final     Glucose reference range applies only to samples taken after fasting for at least 8 hours.  . BUN 08/12/2020 7* 8 - 23 mg/dL Final  . Creatinine, Ser 08/12/2020 0.84  0.61 - 1.24 mg/dL Final  . Calcium 08/12/2020 8.5* 8.9 - 10.3 mg/dL Final  . GFR calc non Af Amer 08/12/2020 >60  >60 mL/min Final  . GFR calc Af Amer 08/12/2020 >60  >60 mL/min Final  . Anion gap 08/12/2020 9  5 - 15 Final   Performed at [facility], [address], [place], [place] [address]  . WBC 08/12/2020 5.5  4.0 - 10.5 K/uL Final  . RBC 08/12/2020 4.46  4.22 - 5.81 MIL/uL Final  . Hemoglobin 08/12/2020 13.2  13.0 - 17.0 g/dL Final  . HCT 08/12/2020 39.3  39 - 52 % Final  . MCV 08/12/2020 88.1  80.0 - 100.0 fL Final  . MCH 08/12/2020 29.6  26.0 - 34.0 pg Final  . MCHC 08/12/2020 33.6  30.0 - 36.0 g/dL Final  . RDW 08/12/2020 12.6  11.5 - 15.5 % Final  . Platelets 08/12/2020 178  150 - 400 K/uL Final  . nRBC 08/12/2020 0.0  0.0 - 0.2 % Final   Performed at [facility], [address] [place], [place] [address]  . Glucose-Capillary 08/12/2020 368* 70 - 99 mg/dL Final   Glucose reference range applies only to samples taken after fasting for at least 8 hours.  . Glucose-Capillary 08/12/2020 333* 70 - 99 mg/dL Final   Glucose reference range applies only to samples taken after fasting for at least 8 hours.  . SURGICAL PATHOLOGY 08/12/2020    Final-Edited                   Value:SURGICAL PATHOLOGY CASE: [id number] PATIENT: Kenneth Franklin Surgical Pathology Report  Specimen Submitted: A. Nasopharyngeal tumor  Clinical History: Malignant neoplasm nasopharynx, sphenoid sinus.  DIAGNOSIS: A. NASOPHARYNGEAL TUMOR; BIOPSY: - KERATINIZING SQUAMOUS CELL CARCINOMA, MODERATELY DIFFERENTIATED.  GROSS DESCRIPTION: Intraoperative Consultation:     Labeled: Nasopharyngeal tumor     Received: Fresh     Specimen: Nasopharyngoscopy with biopsy     Pathologic evaluation performed: Frozen section  diagnosis     Diagnosis: FSA, representative section: Squamous cell carcinoma     Communicated to: Called to Dr. Kathyrn Sheriff at 11:36 AM on 08/12/2020 Quay Burow M.D.     Tissue submitted: A touch preparation with 1 Diff-Quik stained slide is performed.  Additionally, representative sections of the specimen are frozen as FSA1 and 3 frozen section slides are performed.  A. Labeled: Nasopharyngeal tumor Received: Fresh Tissue fragment(s): Multiple                          Size: Aggregate, 1.4 x 1.4 x 0.4 cm Description: Received fresh on a Telfa pad are fragments of pink soft tissue and blood clot.  A touch preparation with 1 Diff-Quik stained slide is performed.  Representative sections are frozen as FSA1 with 3 frozen section slides performed.  The frozen section remnant is submitted in cassette 1 and the remainder of the specimen is submitted in cassette 2.  Final Diagnosis performed by Quay Burow, MD.   Electronically signed 08/13/2020 9:25:55AM The electronic signature indicates that the named Attending Pathologist has evaluated the specimen Technical component performed at [facility], [address], [place], [place] [address] Lab: [id number] Dir: Rush Farmer, MD,  MMM  Professional component performed at [facility], [facility], [place], [place], [place] [address] Lab: [phone] Dir: Dellia Nims. Rubinas, MD  . Glucose-Capillary 08/12/2020 275* 70 - 99 mg/dL Final   Glucose reference range applies only to samples taken after fasting for at least 8 hours.  . Glucose-Capillary 08/12/2020 246* 70 - 99 mg/dL Final   Glucose reference range applies only to samples taken after fasting for at least 8 hours.    Assessment:  Kenneth Franklin is a 74 y.o. 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  Pathology revealed keratinizing squamous cell carcinoma, moderately differentiated.   Head MRI on 08/13/2020 revealed a 5.9 cm nasopharyngeal  mass consistent with known nasopharyngeal carcinoma with involvement of the clivus and sphenoid sinus. There was moderate chronic small vessel ischemic disease and cerebral atrophy.  There was no intracranial extension.  PET scan on 08/18/2020 revealed a 5.7 x 4.2 cm destructive hypermetabolic mass (SUV 96.22) involving the sphenoid sinus, skull base and posterior nasopharynx.  This was consistent with patient's known squamous cell carcinoma. There was no locoregional lymphadenopathy or distant metastatic disease.  He began IMRT on 09/08/2020.  He is s/p week #1 cisplatin on 09/08/2020.  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  Symptomatically,  he has felt "drained".  He notes some mild nausea which has not required antiemetics.  Exam is stable.  Plan: 1.   Labs today: CBC with diff, CMP, Mg. 2.   Clinical [id number] nasopharyngeal carcinoma  Tumor is unresectable.    PET scan on 08/18/2020 reveals locally advanced disease.  Treatment with upfront radiation and chemotherapy followed by adjuvant chemotherapy     Cisplatin 40 mg/m2 weekly x 6 with radiation followed by cisplatin 80 mg/m2 D1 + 5FU 1000 mg/m2/day CI D1-4 q 28 days x 3 cycles  Review interval audiogram   Patient confirms hearing loss associated with prior exposure.   Discuss hearing loss may increase with treatment.   Patient consents to ongoing treatment.  Labs reviewed.  Begin week #2 cisplatin and radiation.  Discuss symptom management.  He has antiemetics at home to use on a prn bases.  Interventions are adequate.      Review anti-emetics.  Discuss use of Decadron. 3.   Electrolyte issues  Potassium 3.7.  Magnesium 1.7.  No electrolyte supplementation needed except in pre-hydration fluids. 4.   Week #2 cisplatin today. 5.   RTC in 1 week for MD assessment, labs (CBC with diff, CMP, Mg), and week #3  cisplatin.  I discussed the assessment and treatment plan with the patient.  The patient was provided an opportunity to ask questions and all were answered.  The patient agreed with the plan and demonstrated an understanding of the instructions.  The patient was advised to call back if the symptoms worsen or if the condition fails to improve as anticipated.   Damar Petit C. Mike Gip, MD, PhD    09/15/2020, 9:29 AM  I, Mirian Mo Tufford, am acting as Education administrator for [employer]. Mike Gip, MD, PhD.  I, Shneur Whittenburg C. Mike Gip, MD, have reviewed the above documentation for accuracy and completeness, and I agree with the above.

## 2020-09-15 ENCOUNTER — Ambulatory Visit
Admission: RE | Admit: 2020-09-15 | Discharge: 2020-09-15 | Disposition: A | Discharge status: Home / Self Care | Payer: Medicare Other | Source: Ambulatory Visit | Attending: Radiation Oncology | Admitting: Radiation Oncology

## 2020-09-15 ENCOUNTER — Ambulatory Visit: Payer: Medicare Other

## 2020-09-15 ENCOUNTER — Encounter: Payer: Self-pay | Admitting: Hematology and Oncology

## 2020-09-15 ENCOUNTER — Other Ambulatory Visit: Payer: Self-pay

## 2020-09-15 ENCOUNTER — Inpatient Hospital Stay: Payer: Medicare Other

## 2020-09-15 ENCOUNTER — Inpatient Hospital Stay (HOSPITAL_BASED_OUTPATIENT_CLINIC_OR_DEPARTMENT_OTHER): Payer: Medicare Other | Admitting: Hematology and Oncology

## 2020-09-15 VITALS — BP 120/71 | HR 92 | Temp 97.7°F | Resp 18 | Wt 174.0 lb

## 2020-09-15 DIAGNOSIS — Z5111 Encounter for antineoplastic chemotherapy: Secondary | ICD-10-CM

## 2020-09-15 DIAGNOSIS — C119 Malignant neoplasm of nasopharynx, unspecified: Secondary | ICD-10-CM

## 2020-09-15 DIAGNOSIS — H9190 Unspecified hearing loss, unspecified ear: Secondary | ICD-10-CM | POA: Diagnosis not present

## 2020-09-15 DIAGNOSIS — E876 Hypokalemia: Secondary | ICD-10-CM | POA: Diagnosis not present

## 2020-09-15 LAB — CBC WITH DIFFERENTIAL/PLATELET
Abs Immature Granulocytes: 0.14 10*3/uL — ABNORMAL HIGH (ref 0.00–0.07)
Basophils Absolute: 0 10*3/uL (ref 0.0–0.1)
Basophils Relative: 0 %
Eosinophils Absolute: 0.1 10*3/uL (ref 0.0–0.5)
Eosinophils Relative: 1 %
HCT: 42.2 % (ref 39.0–52.0)
Hemoglobin: 14 g/dL (ref 13.0–17.0)
Immature Granulocytes: 1 %
Lymphocytes Relative: 13 %
Lymphs Abs: 1.3 10*3/uL (ref 0.7–4.0)
MCH: 29.4 pg (ref 26.0–34.0)
MCHC: 33.2 g/dL (ref 30.0–36.0)
MCV: 88.5 fL (ref 80.0–100.0)
Monocytes Absolute: 0.6 10*3/uL (ref 0.1–1.0)
Monocytes Relative: 6 %
Neutro Abs: 8 10*3/uL — ABNORMAL HIGH (ref 1.7–7.7)
Neutrophils Relative %: 79 %
Platelets: 254 10*3/uL (ref 150–400)
RBC: 4.77 MIL/uL (ref 4.22–5.81)
RDW: 12.7 % (ref 11.5–15.5)
WBC: 10.2 10*3/uL (ref 4.0–10.5)
nRBC: 0 % (ref 0.0–0.2)

## 2020-09-15 LAB — COMPREHENSIVE METABOLIC PANEL
ALT: 25 U/L (ref 0–44)
AST: 22 U/L (ref 15–41)
Albumin: 3.6 g/dL (ref 3.5–5.0)
Alkaline Phosphatase: 90 U/L (ref 38–126)
Anion gap: 8 (ref 5–15)
BUN: 14 mg/dL (ref 8–23)
CO2: 27 mmol/L (ref 22–32)
Calcium: 8.7 mg/dL — ABNORMAL LOW (ref 8.9–10.3)
Chloride: 97 mmol/L — ABNORMAL LOW (ref 98–111)
Creatinine, Ser: 0.94 mg/dL (ref 0.61–1.24)
GFR, Estimated: >60 mL/min (ref >60)
Glucose, Bld: 222 mg/dL — ABNORMAL HIGH (ref 70–99)
Potassium: 3.7 mmol/L (ref 3.5–5.1)
Sodium: 132 mmol/L — ABNORMAL LOW (ref 135–145)
Total Bilirubin: 1 mg/dL (ref 0.3–1.2)
Total Protein: 7.2 g/dL (ref 6.5–8.1)

## 2020-09-15 LAB — MAGNESIUM: Magnesium: 1.7 mg/dL (ref 1.7–2.4)

## 2020-09-15 MED ORDER — PALONOSETRON HCL INJECTION 0.25 MG/5ML
0.2500 mg | Freq: Once | INTRAVENOUS | Status: AC
  Administered 2020-09-15: 0.25 mg via INTRAVENOUS
  Filled 2020-09-15: qty 5

## 2020-09-15 MED ORDER — SODIUM CHLORIDE 0.9 % IV SOLN
150.0000 mg | Freq: Once | INTRAVENOUS | Status: AC
  Administered 2020-09-15: 150 mg via INTRAVENOUS
  Filled 2020-09-15: qty 5

## 2020-09-15 MED ORDER — SODIUM CHLORIDE 0.9 % IV SOLN
10.0000 mg | Freq: Once | INTRAVENOUS | Status: AC
  Administered 2020-09-15: 10 mg via INTRAVENOUS
  Filled 2020-09-15: qty 10

## 2020-09-15 MED ORDER — HEPARIN SOD (PORK) LOCK FLUSH 100 UNIT/ML IV SOLN
500.0000 [IU] | Freq: Once | INTRAVENOUS | Status: AC | PRN
  Administered 2020-09-15: 500 [IU]
  Filled 2020-09-15: qty 5

## 2020-09-15 MED ORDER — SODIUM CHLORIDE 0.9 % IV SOLN
40.0000 mg/m2 | Freq: Once | INTRAVENOUS | Status: AC
  Administered 2020-09-15: 80 mg via INTRAVENOUS
  Filled 2020-09-15: qty 80

## 2020-09-15 MED ORDER — SODIUM CHLORIDE 0.9 % IV SOLN
Freq: Once | INTRAVENOUS | Status: AC
  Filled 2020-09-15: qty 10

## 2020-09-15 MED ORDER — HEPARIN SOD (PORK) LOCK FLUSH 100 UNIT/ML IV SOLN
INTRAVENOUS | Status: AC
  Filled 2020-09-15: qty 5

## 2020-09-15 MED ORDER — SODIUM CHLORIDE 0.9 % IV SOLN
Freq: Once | INTRAVENOUS | Status: AC
  Filled 2020-09-15: qty 250

## 2020-09-15 NOTE — Progress Notes (Signed)
Patient reports feeling drained today, some nausea

## 2020-09-16 ENCOUNTER — Ambulatory Visit: Payer: Medicare Other

## 2020-09-16 ENCOUNTER — Ambulatory Visit
Admission: RE | Admit: 2020-09-16 | Discharge: 2020-09-16 | Disposition: A | Discharge status: Home / Self Care | Payer: Medicare Other | Source: Ambulatory Visit | Attending: Radiation Oncology | Admitting: Radiation Oncology

## 2020-09-16 DIAGNOSIS — C119 Malignant neoplasm of nasopharynx, unspecified: Secondary | ICD-10-CM | POA: Diagnosis not present

## 2020-09-17 ENCOUNTER — Ambulatory Visit: Payer: Medicare Other

## 2020-09-17 ENCOUNTER — Ambulatory Visit
Admission: RE | Admit: 2020-09-17 | Discharge: 2020-09-17 | Disposition: A | Discharge status: Home / Self Care | Payer: Medicare Other | Source: Ambulatory Visit | Attending: Radiation Oncology | Admitting: Radiation Oncology

## 2020-09-17 DIAGNOSIS — C119 Malignant neoplasm of nasopharynx, unspecified: Secondary | ICD-10-CM | POA: Diagnosis not present

## 2020-09-18 ENCOUNTER — Ambulatory Visit
Admission: RE | Admit: 2020-09-18 | Discharge: 2020-09-18 | Disposition: A | Discharge status: Home / Self Care | Payer: Medicare Other | Source: Ambulatory Visit | Attending: Radiation Oncology | Admitting: Radiation Oncology

## 2020-09-18 ENCOUNTER — Ambulatory Visit: Payer: Medicare Other

## 2020-09-18 DIAGNOSIS — C119 Malignant neoplasm of nasopharynx, unspecified: Secondary | ICD-10-CM | POA: Diagnosis not present

## 2020-09-21 ENCOUNTER — Ambulatory Visit: Payer: Medicare Other

## 2020-09-21 ENCOUNTER — Ambulatory Visit
Admission: RE | Admit: 2020-09-21 | Discharge: 2020-09-21 | Disposition: A | Discharge status: Home / Self Care | Payer: Medicare Other | Source: Ambulatory Visit | Attending: Radiation Oncology | Admitting: Radiation Oncology

## 2020-09-21 DIAGNOSIS — C119 Malignant neoplasm of nasopharynx, unspecified: Secondary | ICD-10-CM | POA: Diagnosis not present

## 2020-09-21 DIAGNOSIS — Z7189 Other specified counseling: Secondary | ICD-10-CM | POA: Insufficient documentation

## 2020-09-21 NOTE — Progress Notes (Signed)
[facility]  [address] [place], [place] [address] Phone: [phone]  Fax: [phone]   Clinic Day:  09/22/2020  Referring physician: Verita Lamb, NP  Chief Complaint: Kenneth Franklin is a 74 y.o. male with stage III nasopharyngeal carcinoma who is seen for assessment prior to week #3 cisplatin and concurrent radiation.  HPI: The patient was last seen in the oncology clinic on 09/15/2020. At that time, he felt "drained".  He noted some mild nausea, but did not feel he needed to take anti-emetics.  Hematocrit was 42.2, hemoglobin 14.0, platelets 254,000, WBC 10,200. Sodium was 132.  Glucose was 222.  Magnesium was 1.7. He received week #2 cisplatin.  The patient began IMRT treatment on 09/08/2020. He has received 11 treatments to date. He is scheduled for 36 total treatments.  During the interim, he has been "hanging around." He does not eat much because he cannot taste anything and gets constipated. He eats some fruit and Ensure/Boost BID, but that's all he eats. He will try to drink 3 Ensures daily. His daughter brings him home-cooked meals at night but stopped because he is not eating it anymore. He denies changes in hearing, nausea, and vomiting. His headaches are less frequent. He still has shortness of breath on exertion.   Past Medical History:  Diagnosis Date  . Acute ischemic stroke ([place]) 2017  . Alcohol abuse   . Anxiety   . Cancer ([place])   . Depression   . Diabetes mellitus without complication ([place])   . Dyspnea    pcp knows and ordered rescue inhaler  . Hyperlipidemia   . Hypertension   . Skin cancer    Squamous Cell Carcinoma In Situ    Past Surgical History:  Procedure Laterality Date  . CATARACT EXTRACTION Bilateral   . COLONOSCOPY    . NASOPHARYNGOSCOPY N/A 08/12/2020   Procedure: ENDOSCOPIC NASOPHARYNGOSCOPY WITH BIOPSY;  Surgeon: Margaretha Sheffield, MD;  Location: [facility] ORS;  Service: ENT;  Laterality: N/A;  . PORTA CATH  INSERTION N/A 08/24/2020   Procedure: PORTA CATH INSERTION;  Surgeon: Algernon Huxley, MD;  Location: [facility];  Service: Cardiovascular;  Laterality: N/A;    Family History  Problem Relation Age of Onset  . Diabetes Brother     Social History:  reports that he quit smoking about 13 months ago. His smoking use included cigars and cigarettes. He has a 40.00 pack-year smoking history. He has never used smokeless tobacco. He reports previous alcohol use. He reports that he does not use drugs. He smoked 1 pack/day x 40 years.  He denies exposure of radiation or toxins; he did work with asphalt. He lives alone in [place]. He is widowed. The patient is alone today.  Allergies:  Allergies  Allergen Reactions  . Propoxyphene     Unknown reaction    Current Medications: Current Outpatient Medications  Medication Sig Dispense Refill  . ALPRAZolam (XANAX) 0.25 MG tablet TAKE 1 TABLET EVERY DAY AS NEEDED FOR ANXIETY (Patient taking differently: Take 0.25 mg by mouth daily as needed for anxiety. ) 30 tablet 2  . aspirin EC 81 MG tablet Take 81 mg by mouth daily. Swallow whole.    Marland Kitchen atorvastatin (LIPITOR) 80 MG tablet TAKE 1 TABLET (80 MG TOTAL) BY MOUTH NIGHTLY. (Patient taking differently: Take 80 mg by mouth every evening. ) 90 tablet 0  . busPIRone (BUSPAR) 10 MG tablet TAKE 1 TABLET (10 MG TOTAL) BY MOUTH 2 (TWO) TIMES  DAILY. 180 tablet 0  . clopidogrel (PLAVIX) 75 MG tablet TAKE 1 TABLET (75 MG TOTAL) BY MOUTH DAILY. 90 tablet 0  . DENTA 5000 PLUS 1.1 % CREA dental cream Place 1 application onto teeth at bedtime.     Marland Kitchen dexamethasone (DECADRON) 4 MG tablet Take 2 tablets (8 mg total) by mouth daily. Take daily x 3 days starting the day after cisplatin chemotherapy. Take with food. 30 tablet 1  . escitalopram (LEXAPRO) 10 MG tablet Take 1 tablet (10 mg total) by mouth daily. Stop your lexapro in a week 6 tablet 0  . escitalopram (LEXAPRO) 20 MG tablet Take 20 mg by mouth every morning.      . fluticasone (FLOVENT HFA) 110 MCG/ACT inhaler Inhale 2 puffs into the lungs daily as needed (shortness of breath).    Marland Kitchen glipiZIDE (GLUCOTROL) 5 MG tablet Take 5 mg by mouth daily before breakfast.     . HYDROcodone-acetaminophen (NORCO) 10-325 MG tablet Take 0.5 tablets by mouth every 4 (four) hours as needed for severe pain. 30 tablet 0  . lidocaine-prilocaine (EMLA) cream Apply 1 application topically as needed. Apply small amount to port site at least 1 hour prior to it being accessed, cover with plastic wrap 30 g 1  . losartan (COZAAR) 100 MG tablet TAKE 1 TABLET (100 MG TOTAL) BY MOUTH DAILY. (Patient taking differently: Take 100 mg by mouth every morning. ) 90 tablet 0  . metFORMIN (GLUCOPHAGE) 500 MG tablet Take 1 tablet (500 mg total) by mouth daily with breakfast. 90 tablet 0  . mirtazapine (REMERON) 30 MG tablet Take 1 tablet (30 mg total) by mouth at bedtime. (Patient taking differently: Take 15 mg by mouth at bedtime. ) 30 tablet 0  . ondansetron (ZOFRAN) 8 MG tablet Take 1 tablet (8 mg total) by mouth 2 (two) times daily as needed. Start on the third day after cisplatin chemotherapy. 30 tablet 1  . pantoprazole (PROTONIX) 20 MG tablet Take 20 mg by mouth daily before lunch.     . sitaGLIPtin (JANUVIA) 100 MG tablet Take 100 mg by mouth every morning.     . sucralfate (CARAFATE) 1 g tablet Take 1 tablet (1 g total) by mouth 4 (four) times daily. 120 tablet 11  . VITAMIN D PO Take 1 capsule by mouth daily.    Marland Kitchen nystatin cream (MYCOSTATIN) Apply 1 application topically 2 (two) times daily as needed (yeast).  (Patient not taking: Reported on 09/22/2020)    . prochlorperazine (COMPAZINE) 10 MG tablet Take 1 tablet (10 mg total) by mouth every 6 (six) hours as needed (Nausea or vomiting). (Patient not taking: Reported on 09/22/2020) 30 tablet 1   No current facility-administered medications for this visit.    Review of Systems  Constitutional: Positive for weight loss (3 lbs). Negative  for chills, diaphoresis, fever and malaise/fatigue.  HENT: Negative for congestion, ear discharge, ear pain, hearing loss, nosebleeds, sinus pain, sore throat and tinnitus.        No sense of taste.  Eyes: Negative for blurred vision.  Respiratory: Positive for shortness of breath (on exertion). Negative for cough, hemoptysis and sputum production.   Cardiovascular: Negative.  Negative for chest pain, palpitations and leg swelling.  Gastrointestinal: Positive for constipation. Negative for abdominal pain, blood in stool, diarrhea, heartburn, melena, nausea and vomiting.  Genitourinary: Negative.  Negative for dysuria, frequency, hematuria and urgency.  Musculoskeletal: Negative.  Negative for back pain, joint pain, myalgias and neck pain.  Skin: Negative for  itching and rash.  Neurological: Positive for headaches. Negative for dizziness, tingling, sensory change and weakness.  Endo/Heme/Allergies: Negative.  Does not bruise/bleed easily.  Psychiatric/Behavioral: Negative.  Negative for depression and memory loss. The patient is not nervous/anxious and does not have insomnia.   All other systems reviewed and are negative.  Performance status (ECOG): 1  Vitals Blood pressure 116/79, pulse 87, temperature 97.8 F (36.6 C), temperature source Tympanic, resp. rate 18, height 6' (1.829 m), weight 171 lb 11.2 oz (77.9 kg), SpO2 99 %.   Physical Exam Vitals and nursing note reviewed.  Constitutional:      General: He is not in acute distress.    Appearance: Normal appearance. He is well-developed.     Interventions: Face mask in place.     Comments: Gentleman sitting comfortably in wheelchair in no acute distress. Able to get onto table for exam.  HENT:     Head: Normocephalic and atraumatic.     Comments: Cap. Gray hair.    Right Ear: Hearing normal.     Left Ear: Hearing normal.     Mouth/Throat:     Mouth: Mucous membranes are moist. No oral lesions.  Eyes:     General: No scleral  icterus.    Extraocular Movements: Extraocular movements intact.     Conjunctiva/sclera: Conjunctivae normal.     Pupils: Pupils are equal, round, and reactive to light.  Cardiovascular:     Rate and Rhythm: Normal rate and regular rhythm.     Heart sounds: Normal heart sounds. No murmur heard.  No friction rub. No gallop.   Pulmonary:     Effort: Pulmonary effort is normal.     Breath sounds: Normal breath sounds. No wheezing, rhonchi or rales.  Abdominal:     General: Bowel sounds are normal. There is no distension.     Palpations: Abdomen is soft. There is no hepatomegaly, splenomegaly or mass.     Tenderness: There is no abdominal tenderness. There is no guarding or rebound.  Musculoskeletal:        General: No tenderness. Normal range of motion.     Cervical back: Normal range of motion and neck supple.  Lymphadenopathy:     Head:     Right side of head: No preauricular, posterior auricular or occipital adenopathy.     Left side of head: No preauricular, posterior auricular or occipital adenopathy.     Cervical: No cervical adenopathy.     Upper Body:     Right upper body: No supraclavicular or axillary adenopathy.     Left upper body: No supraclavicular or axillary adenopathy.     Lower Body: No right inguinal adenopathy. No left inguinal adenopathy.  Skin:    General: Skin is warm and dry.     Findings: No bruising, erythema, lesion or rash.  Neurological:     Mental Status: He is alert and oriented to person, place, and time.  Psychiatric:        Behavior: Behavior normal.        Thought Content: Thought content normal.        Judgment: Judgment normal.    No visits with results within 3 Day(s) from this visit.  Latest known visit with results is:  Admission on 08/12/2020, Discharged on 08/12/2020  Component Date Value Ref Range Status  . Sodium 08/12/2020 132* 135 - 145 mmol/L Final  . Potassium 08/12/2020 4.2  3.5 - 5.1 mmol/L Final  . Chloride 08/12/2020 96* 98  -  111 mmol/L Final  . CO2 08/12/2020 27  22 - 32 mmol/L Final  . Glucose, Bld 08/12/2020 374* 70 - 99 mg/dL Final   Glucose reference range applies only to samples taken after fasting for at least 8 hours.  . BUN 08/12/2020 7* 8 - 23 mg/dL Final  . Creatinine, Ser 08/12/2020 0.84  0.61 - 1.24 mg/dL Final  . Calcium 08/12/2020 8.5* 8.9 - 10.3 mg/dL Final  . GFR calc non Af Amer 08/12/2020 >60  >60 mL/min Final  . GFR calc Af Amer 08/12/2020 >60  >60 mL/min Final  . Anion gap 08/12/2020 9  5 - 15 Final   Performed at [facility], [address], [place], [place] [address]  . WBC 08/12/2020 5.5  4.0 - 10.5 K/uL Final  . RBC 08/12/2020 4.46  4.22 - 5.81 MIL/uL Final  . Hemoglobin 08/12/2020 13.2  13.0 - 17.0 g/dL Final  . HCT 08/12/2020 39.3  39 - 52 % Final  . MCV 08/12/2020 88.1  80.0 - 100.0 fL Final  . MCH 08/12/2020 29.6  26.0 - 34.0 pg Final  . MCHC 08/12/2020 33.6  30.0 - 36.0 g/dL Final  . RDW 08/12/2020 12.6  11.5 - 15.5 % Final  . Platelets 08/12/2020 178  150 - 400 K/uL Final  . nRBC 08/12/2020 0.0  0.0 - 0.2 % Final   Performed at [facility], [address], [place],  [address]  . Glucose-Capillary 08/12/2020 368* 70 - 99 mg/dL Final   Glucose reference range applies only to samples taken after fasting for at least 8 hours.  . Glucose-Capillary 08/12/2020 333* 70 - 99 mg/dL Final   Glucose reference range applies only to samples taken after fasting for at least 8 hours.  . SURGICAL PATHOLOGY 08/12/2020    Final-Edited                   Value:SURGICAL PATHOLOGY CASE: [id number] PATIENT: Kenneth Franklin Surgical Pathology Report  Specimen Submitted: A. Nasopharyngeal tumor  Clinical History: Malignant neoplasm nasopharynx, sphenoid sinus.  DIAGNOSIS: A. NASOPHARYNGEAL TUMOR; BIOPSY: - KERATINIZING SQUAMOUS CELL CARCINOMA, MODERATELY DIFFERENTIATED.  GROSS DESCRIPTION: Intraoperative Consultation:     Labeled: Nasopharyngeal tumor      Received: Fresh     Specimen: Nasopharyngoscopy with biopsy     Pathologic evaluation performed: Frozen section diagnosis     Diagnosis: FSA, representative section: Squamous cell carcinoma     Communicated to: Called to Dr. Kathyrn Sheriff at 11:36 AM on 08/12/2020 Quay Burow M.D.     Tissue submitted: A touch preparation with 1 Diff-Quik stained slide is performed.  Additionally, representative sections of the specimen are frozen as FSA1 and 3 frozen section slides are performed.  A. Labeled: Nasopharyngeal tumor Received: Fresh Tissue fragment(s): Multiple                          Size: Aggregate, 1.4 x 1.4 x 0.4 cm Description: Received fresh on a Telfa pad are fragments of pink soft tissue and blood clot.  A touch preparation with 1 Diff-Quik stained slide is performed.  Representative sections are frozen as FSA1 with 3 frozen section slides performed.  The frozen section remnant is submitted in cassette 1 and the remainder of the specimen is submitted in cassette 2.  Final Diagnosis performed by Quay Burow, MD.   Electronically signed 08/13/2020 9:25:55AM The electronic signature indicates that the named Attending Pathologist has evaluated the specimen Technical component performed  at [facility], [address], [place], [place] [address] Lab: [phone] Dir: Rush Farmer, MD, MMM  Professional component performed at [facility], [facility], [place], [place], [place] [address] Lab: [phone] Dir: Dellia Nims. Rubinas, MD  . Glucose-Capillary 08/12/2020 275* 70 - 99 mg/dL Final   Glucose reference range applies only to samples taken after fasting for at least 8 hours.  . Glucose-Capillary 08/12/2020 246* 70 - 99 mg/dL Final   Glucose reference range applies only to samples taken after fasting for at least 8 hours.    Assessment:  Kenneth Franklin is a 74 y.o. 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  Pathology revealed keratinizing  squamous cell carcinoma, moderately differentiated.   Head MRI on 08/13/2020 revealed a 5.9 cm nasopharyngeal mass consistent with known nasopharyngeal carcinoma with involvement of the clivus and sphenoid sinus. There was moderate chronic small vessel ischemic disease and cerebral atrophy.  There was no intracranial extension.  PET scan on 08/18/2020 revealed a 5.7 x 4.2 cm destructive hypermetabolic mass (SUV 97.02) involving the sphenoid sinus, skull base and posterior nasopharynx.  This was consistent with patient's known squamous cell carcinoma. There was no locoregional lymphadenopathy or distant metastatic disease.  He began IMRT on 09/08/2020.  He is s/p week #1 cisplatin on 09/08/2020.  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  Symptomatically, he has been "hanging around." He does not eat much because he cannot taste anything. He eats some fruit and Ensure/Boost BID.  He denies any change in hearing.  Exam is stable.  Plan: 1.   Labs today: CBC with diff, CMP, Mg 2.   Clinical [id number] nasopharyngeal carcinoma  Tumor is unresectable.  PET scan on 08/18/2020 revealed only locally advanced disease.  Treatment consist of upfront radiation and chemotherapy followed by adjuvant chemotherapy   Cisplatin 40 mg/m2 weekly x 6 with radiation followed by cisplatin 80 mg/m2 D1 + 5FU 1000 mg/m2/day CI D1-4 q 28 days x 3 cycles  Baseline audiogram confirmed hearing loss associated with prior exposure.  Labs reviewed.  Begin week #3 cisplatin and radiation.   Patient tolerating treatment well without nausea or change in hearing.   He notes no sense of taste. 3.   Hypomagnesemia  Magnesium 1.6.  Magnesium 2 gm IV. 4.   Nutrition  Discuss importance of nutrition.    Discuss trying various foods.  Increase Ensure to 3 times a day.   5.   Week #3 cisplatin today. 6.   RTC in 1 week  for MD assessment, labs (CBC with diff, CMP, Mg), and week #4 cisplatin.  I discussed the assessment and treatment plan with the patient.  The patient was provided an opportunity to ask questions and all were answered.  The patient agreed with the plan and demonstrated an understanding of the instructions.  The patient was advised to call back if the symptoms worsen or if the condition fails to improve as anticipated.   Melissa C. Mike Gip, MD, PhD    09/22/2020, 9:35 AM  I, Mirian Mo Tufford, am acting as Education administrator for [employer]. Mike Gip, MD, PhD.  I, Melissa C. Mike Gip, MD, have reviewed the above documentation for accuracy and completeness, and I agree with the above.

## 2020-09-22 ENCOUNTER — Ambulatory Visit
Admission: RE | Admit: 2020-09-22 | Discharge: 2020-09-22 | Disposition: A | Discharge status: Home / Self Care | Payer: Medicare Other | Source: Ambulatory Visit | Attending: Radiation Oncology | Admitting: Radiation Oncology

## 2020-09-22 ENCOUNTER — Ambulatory Visit: Payer: Medicare Other

## 2020-09-22 ENCOUNTER — Other Ambulatory Visit: Payer: Self-pay

## 2020-09-22 ENCOUNTER — Inpatient Hospital Stay: Payer: Medicare Other

## 2020-09-22 ENCOUNTER — Inpatient Hospital Stay (HOSPITAL_BASED_OUTPATIENT_CLINIC_OR_DEPARTMENT_OTHER): Payer: Medicare Other | Admitting: Hematology and Oncology

## 2020-09-22 ENCOUNTER — Encounter: Payer: Self-pay | Admitting: Hematology and Oncology

## 2020-09-22 VITALS — BP 116/79 | HR 87 | Temp 97.8°F | Resp 18 | Ht 72.0 in | Wt 171.7 lb

## 2020-09-22 DIAGNOSIS — Z5111 Encounter for antineoplastic chemotherapy: Secondary | ICD-10-CM

## 2020-09-22 DIAGNOSIS — H9190 Unspecified hearing loss, unspecified ear: Secondary | ICD-10-CM

## 2020-09-22 DIAGNOSIS — C119 Malignant neoplasm of nasopharynx, unspecified: Secondary | ICD-10-CM

## 2020-09-22 DIAGNOSIS — E876 Hypokalemia: Secondary | ICD-10-CM

## 2020-09-22 DIAGNOSIS — Z95828 Presence of other vascular implants and grafts: Secondary | ICD-10-CM

## 2020-09-22 LAB — CBC WITH DIFFERENTIAL/PLATELET
Abs Immature Granulocytes: 0.07 10*3/uL (ref 0.00–0.07)
Basophils Absolute: 0 10*3/uL (ref 0.0–0.1)
Basophils Relative: 0 %
Eosinophils Absolute: 0.1 10*3/uL (ref 0.0–0.5)
Eosinophils Relative: 1 %
HCT: 38.7 % — ABNORMAL LOW (ref 39.0–52.0)
Hemoglobin: 12.9 g/dL — ABNORMAL LOW (ref 13.0–17.0)
Immature Granulocytes: 1 %
Lymphocytes Relative: 11 %
Lymphs Abs: 0.9 10*3/uL (ref 0.7–4.0)
MCH: 29.5 pg (ref 26.0–34.0)
MCHC: 33.3 g/dL (ref 30.0–36.0)
MCV: 88.6 fL (ref 80.0–100.0)
Monocytes Absolute: 0.6 10*3/uL (ref 0.1–1.0)
Monocytes Relative: 7 %
Neutro Abs: 6.8 10*3/uL (ref 1.7–7.7)
Neutrophils Relative %: 80 %
Platelets: 236 10*3/uL (ref 150–400)
RBC: 4.37 MIL/uL (ref 4.22–5.81)
RDW: 12.6 % (ref 11.5–15.5)
WBC: 8.5 10*3/uL (ref 4.0–10.5)
nRBC: 0 % (ref 0.0–0.2)

## 2020-09-22 LAB — COMPREHENSIVE METABOLIC PANEL
ALT: 23 U/L (ref 0–44)
AST: 19 U/L (ref 15–41)
Albumin: 3.7 g/dL (ref 3.5–5.0)
Alkaline Phosphatase: 90 U/L (ref 38–126)
Anion gap: 9 (ref 5–15)
BUN: 17 mg/dL (ref 8–23)
CO2: 26 mmol/L (ref 22–32)
Calcium: 8.9 mg/dL (ref 8.9–10.3)
Chloride: 93 mmol/L — ABNORMAL LOW (ref 98–111)
Creatinine, Ser: 0.79 mg/dL (ref 0.61–1.24)
GFR, Estimated: >60 mL/min (ref >60)
Glucose, Bld: 282 mg/dL — ABNORMAL HIGH (ref 70–99)
Potassium: 4.5 mmol/L (ref 3.5–5.1)
Sodium: 128 mmol/L — ABNORMAL LOW (ref 135–145)
Total Bilirubin: 0.9 mg/dL (ref 0.3–1.2)
Total Protein: 7.2 g/dL (ref 6.5–8.1)

## 2020-09-22 LAB — MAGNESIUM: Magnesium: 1.6 mg/dL — ABNORMAL LOW (ref 1.7–2.4)

## 2020-09-22 MED ORDER — SODIUM CHLORIDE 0.9% FLUSH
10.0000 mL | Freq: Once | INTRAVENOUS | Status: AC
  Administered 2020-09-22: 10 mL via INTRAVENOUS
  Filled 2020-09-22: qty 10

## 2020-09-22 MED ORDER — SODIUM CHLORIDE 0.9 % IV SOLN
40.0000 mg/m2 | Freq: Once | INTRAVENOUS | Status: AC
  Administered 2020-09-22: 80 mg via INTRAVENOUS
  Filled 2020-09-22: qty 80

## 2020-09-22 MED ORDER — HEPARIN SOD (PORK) LOCK FLUSH 100 UNIT/ML IV SOLN
500.0000 [IU] | Freq: Once | INTRAVENOUS | Status: AC | PRN
  Administered 2020-09-22: 500 [IU]
  Filled 2020-09-22: qty 5

## 2020-09-22 MED ORDER — PALONOSETRON HCL INJECTION 0.25 MG/5ML
0.2500 mg | Freq: Once | INTRAVENOUS | Status: AC
  Administered 2020-09-22: 0.25 mg via INTRAVENOUS
  Filled 2020-09-22: qty 5

## 2020-09-22 MED ORDER — HEPARIN SOD (PORK) LOCK FLUSH 100 UNIT/ML IV SOLN
INTRAVENOUS | Status: AC
  Filled 2020-09-22: qty 5

## 2020-09-22 MED ORDER — SODIUM CHLORIDE 0.9 % IV SOLN
Freq: Once | INTRAVENOUS | Status: AC
  Filled 2020-09-22: qty 10

## 2020-09-22 MED ORDER — SODIUM CHLORIDE 0.9 % IV SOLN
Freq: Once | INTRAVENOUS | Status: AC
  Filled 2020-09-22: qty 250

## 2020-09-22 MED ORDER — SODIUM CHLORIDE 0.9 % IV SOLN
150.0000 mg | Freq: Once | INTRAVENOUS | Status: AC
  Administered 2020-09-22: 150 mg via INTRAVENOUS
  Filled 2020-09-22: qty 150

## 2020-09-22 MED ORDER — SODIUM CHLORIDE 0.9 % IV SOLN
10.0000 mg | Freq: Once | INTRAVENOUS | Status: AC
  Administered 2020-09-22: 10 mg via INTRAVENOUS
  Filled 2020-09-22: qty 10

## 2020-09-22 NOTE — Progress Notes (Signed)
The patient c/o chronic constipation and no appitite

## 2020-09-23 ENCOUNTER — Ambulatory Visit
Admission: RE | Admit: 2020-09-23 | Discharge: 2020-09-23 | Disposition: A | Discharge status: Home / Self Care | Payer: Medicare Other | Source: Ambulatory Visit | Attending: Radiation Oncology | Admitting: Radiation Oncology

## 2020-09-23 ENCOUNTER — Ambulatory Visit: Payer: Medicare Other

## 2020-09-23 DIAGNOSIS — C119 Malignant neoplasm of nasopharynx, unspecified: Secondary | ICD-10-CM | POA: Diagnosis not present

## 2020-09-24 ENCOUNTER — Ambulatory Visit
Admission: RE | Admit: 2020-09-24 | Discharge: 2020-09-24 | Disposition: A | Discharge status: Home / Self Care | Payer: Medicare Other | Source: Ambulatory Visit | Attending: Radiation Oncology | Admitting: Radiation Oncology

## 2020-09-24 ENCOUNTER — Ambulatory Visit: Payer: Medicare Other

## 2020-09-24 DIAGNOSIS — C119 Malignant neoplasm of nasopharynx, unspecified: Secondary | ICD-10-CM | POA: Diagnosis not present

## 2020-09-25 ENCOUNTER — Ambulatory Visit: Payer: Medicare Other

## 2020-09-25 ENCOUNTER — Ambulatory Visit
Admission: RE | Admit: 2020-09-25 | Discharge: 2020-09-25 | Disposition: A | Discharge status: Home / Self Care | Payer: Medicare Other | Source: Ambulatory Visit | Attending: Radiation Oncology | Admitting: Radiation Oncology

## 2020-09-25 DIAGNOSIS — C119 Malignant neoplasm of nasopharynx, unspecified: Secondary | ICD-10-CM | POA: Diagnosis not present

## 2020-09-28 ENCOUNTER — Ambulatory Visit
Admission: RE | Admit: 2020-09-28 | Discharge: 2020-09-28 | Disposition: A | Discharge status: Home / Self Care | Payer: Medicare Other | Source: Ambulatory Visit | Attending: Radiation Oncology | Admitting: Radiation Oncology

## 2020-09-28 ENCOUNTER — Ambulatory Visit: Payer: Medicare Other

## 2020-09-28 DIAGNOSIS — C119 Malignant neoplasm of nasopharynx, unspecified: Secondary | ICD-10-CM | POA: Insufficient documentation

## 2020-09-28 NOTE — Progress Notes (Signed)
[facility]  [address] [place], [place] [address] Phone: [phone]  Fax: [phone]   Clinic Day:  09/29/2020  Referring physician: Verita Lamb, NP  Chief Complaint: Kenneth Franklin is a 74 y.o. male with stage III nasopharyngeal carcinoma who is seen for assessment prior to week #4 cisplatin and concurrent radiation.  HPI: The patient was last seen in the oncology clinic on 09/22/2020. At that time, he noted a change in taste.  He was only eating fruit and drinking Boost.  He denied any nausea, vomiting, or change in hearing.  Hematocrit was 38.7, hemoglobin 12.9, MCV 88.6, platelets 236,000, WBC 8,500. Sodium was 128. Glucose was 282. Magnesium was 1.6. He received week #3 cisplatin.  He received 2 gm of magnesium.  The patient began IMRT treatment on 09/08/2020. He has received4fractions to date.  During the interim, he has been okay. His appetite is poor because he cannot taste anything. He also reports mild nausea. He denies vomiting. He has been experimenting with his food and has found that he can taste Boost. He has been drinking fluids. His headaches have resolved and he has not had any problems with his hearing.   Past Medical History:  Diagnosis Date  . Acute ischemic stroke ([place]) 2017  . Alcohol abuse   . Anxiety   . Cancer ([place])   . Depression   . Diabetes mellitus without complication ([place])   . Dyspnea    pcp knows and ordered rescue inhaler  . Hyperlipidemia   . Hypertension   . Skin cancer    Squamous Cell Carcinoma In Situ    Past Surgical History:  Procedure Laterality Date  . CATARACT EXTRACTION Bilateral   . COLONOSCOPY    . NASOPHARYNGOSCOPY N/A 08/12/2020   Procedure: ENDOSCOPIC NASOPHARYNGOSCOPY WITH BIOPSY;  Surgeon: Margaretha Sheffield, MD;  Location: [facility] ORS;  Service: ENT;  Laterality: N/A;  . PORTA CATH INSERTION N/A 08/24/2020   Procedure: PORTA CATH INSERTION;  Surgeon: Algernon Huxley, MD;  Location:  [facility];  Service: Cardiovascular;  Laterality: N/A;    Family History  Problem Relation Age of Onset  . Diabetes Brother     Social History:  reports that he quit smoking about 13 months ago. His smoking use included cigars and cigarettes. He has a 40.00 pack-year smoking history. He has never used smokeless tobacco. He reports previous alcohol use. He reports that he does not use drugs. He smoked 1 pack/day x 40 years.  He denies exposure of radiation or toxins; he did work with asphalt. He lives alone in [place]. He is widowed. The patient is alone today.  Allergies:  Allergies  Allergen Reactions  . Propoxyphene     Unknown reaction    Current Medications: Current Outpatient Medications  Medication Sig Dispense Refill  . ALPRAZolam (XANAX) 0.25 MG tablet TAKE 1 TABLET EVERY DAY AS NEEDED FOR ANXIETY (Patient taking differently: Take 0.25 mg by mouth daily as needed for anxiety. ) 30 tablet 2  . aspirin EC 81 MG tablet Take 81 mg by mouth daily. Swallow whole.    Marland Kitchen atorvastatin (LIPITOR) 80 MG tablet TAKE 1 TABLET (80 MG TOTAL) BY MOUTH NIGHTLY. (Patient taking differently: Take 80 mg by mouth every evening. ) 90 tablet 0  . busPIRone (BUSPAR) 10 MG tablet TAKE 1 TABLET (10 MG TOTAL) BY MOUTH 2 (TWO) TIMES DAILY. 180 tablet 0  . clopidogrel (PLAVIX) 75 MG tablet TAKE 1 TABLET (75 MG  TOTAL) BY MOUTH DAILY. 90 tablet 0  . DENTA 5000 PLUS 1.1 % CREA dental cream Place 1 application onto teeth at bedtime.     Marland Kitchen dexamethasone (DECADRON) 4 MG tablet Take 2 tablets (8 mg total) by mouth daily. Take daily x 3 days starting the day after cisplatin chemotherapy. Take with food. 30 tablet 1  . escitalopram (LEXAPRO) 20 MG tablet Take 20 mg by mouth every morning.     . fluticasone (FLOVENT HFA) 110 MCG/ACT inhaler Inhale 2 puffs into the lungs daily as needed (shortness of breath).    Marland Kitchen glipiZIDE (GLUCOTROL) 5 MG tablet Take 5 mg by mouth daily before breakfast.     .  HYDROcodone-acetaminophen (NORCO) 10-325 MG tablet Take 0.5 tablets by mouth every 4 (four) hours as needed for severe pain. 30 tablet 0  . lidocaine-prilocaine (EMLA) cream Apply 1 application topically as needed. Apply small amount to port site at least 1 hour prior to it being accessed, cover with plastic wrap 30 g 1  . losartan (COZAAR) 100 MG tablet TAKE 1 TABLET (100 MG TOTAL) BY MOUTH DAILY. (Patient taking differently: Take 100 mg by mouth every morning. ) 90 tablet 0  . metFORMIN (GLUCOPHAGE) 500 MG tablet Take 1 tablet (500 mg total) by mouth daily with breakfast. 90 tablet 0  . mirtazapine (REMERON) 30 MG tablet Take 1 tablet (30 mg total) by mouth at bedtime. (Patient taking differently: Take 15 mg by mouth at bedtime. ) 30 tablet 0  . pantoprazole (PROTONIX) 20 MG tablet Take 20 mg by mouth daily before lunch.     . sitaGLIPtin (JANUVIA) 100 MG tablet Take 100 mg by mouth every morning.     Marland Kitchen VITAMIN D PO Take 1 capsule by mouth daily.    Marland Kitchen escitalopram (LEXAPRO) 10 MG tablet Take 1 tablet (10 mg total) by mouth daily. Stop your lexapro in a week 6 tablet 0  . nystatin cream (MYCOSTATIN) Apply 1 application topically 2 (two) times daily as needed (yeast).  (Patient not taking: Reported on 09/22/2020)    . ondansetron (ZOFRAN) 8 MG tablet Take 1 tablet (8 mg total) by mouth 2 (two) times daily as needed. Start on the third day after cisplatin chemotherapy. (Patient not taking: Reported on 09/29/2020) 30 tablet 1  . prochlorperazine (COMPAZINE) 10 MG tablet Take 1 tablet (10 mg total) by mouth every 6 (six) hours as needed (Nausea or vomiting). (Patient not taking: Reported on 09/22/2020) 30 tablet 1  . sucralfate (CARAFATE) 1 g tablet Take 1 tablet (1 g total) by mouth 4 (four) times daily. 120 tablet 11   No current facility-administered medications for this visit.    Review of Systems  Constitutional: Positive for weight loss (1 lb). Negative for chills, diaphoresis, fever and  malaise/fatigue.  HENT: Negative for congestion, ear discharge, ear pain, hearing loss, nosebleeds, sinus pain, sore throat and tinnitus.        No sense of taste.  Eyes: Negative for blurred vision.  Respiratory: Positive for shortness of breath (on exertion). Negative for cough, hemoptysis and sputum production.   Cardiovascular: Negative.  Negative for chest pain, palpitations and leg swelling.  Gastrointestinal: Positive for nausea (mild). Negative for abdominal pain, blood in stool, constipation, diarrhea, heartburn, melena and vomiting.       Poor appetite. Drinks Boost.  Genitourinary: Negative.  Negative for dysuria, frequency, hematuria and urgency.  Musculoskeletal: Negative.  Negative for back pain, joint pain, myalgias and neck pain.  Skin: Negative  for itching and rash.  Neurological: Negative for dizziness, tingling, sensory change, weakness and headaches.  Endo/Heme/Allergies: Negative.  Does not bruise/bleed easily.  Psychiatric/Behavioral: Negative.  Negative for depression and memory loss. The patient is not nervous/anxious and does not have insomnia.   All other systems reviewed and are negative.  Performance status (ECOG): 1  Vitals Blood pressure 120/71, pulse 94, temperature (!) 96 F (35.6 C), temperature source Tympanic, resp. rate 18, weight 170 lb (77.1 kg), SpO2 100 %.   Physical Exam Vitals and nursing note reviewed.  Constitutional:      General: He is not in acute distress.    Appearance: Normal appearance. He is well-developed.     Interventions: Face mask in place.     Comments: Gentleman sitting comfortably in wheelchair in no acute distress. Able to get onto table for exam.  HENT:     Head: Normocephalic and atraumatic.     Comments: Cap. Gray hair.    Right Ear: Hearing normal.     Left Ear: Hearing normal.     Mouth/Throat:     Mouth: Mucous membranes are moist. No oral lesions.  Eyes:     General: No scleral icterus.    Extraocular Movements:  Extraocular movements intact.     Conjunctiva/sclera: Conjunctivae normal.     Pupils: Pupils are equal, round, and reactive to light.  Cardiovascular:     Rate and Rhythm: Normal rate and regular rhythm.     Heart sounds: Normal heart sounds. No murmur heard.  No friction rub. No gallop.   Pulmonary:     Effort: Pulmonary effort is normal.     Breath sounds: Normal breath sounds. No wheezing, rhonchi or rales.  Abdominal:     General: Bowel sounds are normal. There is no distension.     Palpations: Abdomen is soft. There is no hepatomegaly, splenomegaly or mass.     Tenderness: There is no abdominal tenderness. There is no guarding or rebound.  Musculoskeletal:        General: No tenderness. Normal range of motion.     Cervical back: Normal range of motion and neck supple.  Lymphadenopathy:     Head:     Right side of head: No preauricular, posterior auricular or occipital adenopathy.     Left side of head: No preauricular, posterior auricular or occipital adenopathy.     Cervical: No cervical adenopathy.     Upper Body:     Right upper body: No supraclavicular or axillary adenopathy.     Left upper body: No supraclavicular or axillary adenopathy.     Lower Body: No right inguinal adenopathy. No left inguinal adenopathy.  Skin:    General: Skin is warm and dry.     Findings: No bruising, erythema, lesion or rash.  Neurological:     Mental Status: He is alert and oriented to person, place, and time.  Psychiatric:        Behavior: Behavior normal.        Thought Content: Thought content normal.        Judgment: Judgment normal.    No visits with results within 3 Day(s) from this visit.  Latest known visit with results is:  Admission on 08/12/2020, Discharged on 08/12/2020  Component Date Value Ref Range Status  . Sodium 08/12/2020 132* 135 - 145 mmol/L Final  . Potassium 08/12/2020 4.2  3.5 - 5.1 mmol/L Final  . Chloride 08/12/2020 96* 98 - 111 mmol/L Final  . CO2  08/12/2020 27  22 - 32 mmol/L Final  . Glucose, Bld 08/12/2020 374* 70 - 99 mg/dL Final   Glucose reference range applies only to samples taken after fasting for at least 8 hours.  . BUN 08/12/2020 7* 8 - 23 mg/dL Final  . Creatinine, Ser 08/12/2020 0.84  0.61 - 1.24 mg/dL Final  . Calcium 08/12/2020 8.5* 8.9 - 10.3 mg/dL Final  . GFR calc non Af Amer 08/12/2020 >60  >60 mL/min Final  . GFR calc Af Amer 08/12/2020 >60  >60 mL/min Final  . Anion gap 08/12/2020 9  5 - 15 Final   Performed at [facility], [address] [place], [place] [address]  . WBC 08/12/2020 5.5  4.0 - 10.5 K/uL Final  . RBC 08/12/2020 4.46  4.22 - 5.81 MIL/uL Final  . Hemoglobin 08/12/2020 13.2  13.0 - 17.0 g/dL Final  . HCT 08/12/2020 39.3  39 - 52 % Final  . MCV 08/12/2020 88.1  80.0 - 100.0 fL Final  . MCH 08/12/2020 29.6  26.0 - 34.0 pg Final  . MCHC 08/12/2020 33.6  30.0 - 36.0 g/dL Final  . RDW 08/12/2020 12.6  11.5 - 15.5 % Final  . Platelets 08/12/2020 178  150 - 400 K/uL Final  . nRBC 08/12/2020 0.0  0.0 - 0.2 % Final   Performed at [facility], [address], [place], [place] [address]  . Glucose-Capillary 08/12/2020 368* 70 - 99 mg/dL Final   Glucose reference range applies only to samples taken after fasting for at least 8 hours.  . Glucose-Capillary 08/12/2020 333* 70 - 99 mg/dL Final   Glucose reference range applies only to samples taken after fasting for at least 8 hours.  . SURGICAL PATHOLOGY 08/12/2020    Final-Edited                   Value:SURGICAL PATHOLOGY CASE: [id number] PATIENT: Franklyn Lor Surgical Pathology Report  Specimen Submitted: A. Nasopharyngeal tumor  Clinical History: Malignant neoplasm nasopharynx, sphenoid sinus.  DIAGNOSIS: A. NASOPHARYNGEAL TUMOR; BIOPSY: - KERATINIZING SQUAMOUS CELL CARCINOMA, MODERATELY DIFFERENTIATED.  GROSS DESCRIPTION: Intraoperative Consultation:     Labeled: Nasopharyngeal tumor     Received: Fresh      Specimen: Nasopharyngoscopy with biopsy     Pathologic evaluation performed: Frozen section diagnosis     Diagnosis: FSA, representative section: Squamous cell carcinoma     Communicated to: Called to Dr. Kathyrn Sheriff at 11:36 AM on 08/12/2020 Quay Burow M.D.     Tissue submitted: A touch preparation with 1 Diff-Quik stained slide is performed.  Additionally, representative sections of the specimen are frozen as FSA1 and 3 frozen section slides are performed.  A. Labeled: Nasopharyngeal tumor Received: Fresh Tissue fragment(s): Multiple                          Size: Aggregate, 1.4 x 1.4 x 0.4 cm Description: Received fresh on a Telfa pad are fragments of pink soft tissue and blood clot.  A touch preparation with 1 Diff-Quik stained slide is performed.  Representative sections are frozen as FSA1 with 3 frozen section slides performed.  The frozen section remnant is submitted in cassette 1 and the remainder of the specimen is submitted in cassette 2.  Final Diagnosis performed by Quay Burow, MD.   Electronically signed 08/13/2020 9:25:55AM The electronic signature indicates that the named Attending Pathologist has evaluated the specimen Technical component performed at [facility], [address] [place],  [place] [address] Lab: [phone] Dir: Rush Farmer, MD, MMM  Professional component performed at [facility], [facility], [place], [place], [place] [address] Lab: [phone] Dir: Dellia Nims. Rubinas, MD  . Glucose-Capillary 08/12/2020 275* 70 - 99 mg/dL Final   Glucose reference range applies only to samples taken after fasting for at least 8 hours.  . Glucose-Capillary 08/12/2020 246* 70 - 99 mg/dL Final   Glucose reference range applies only to samples taken after fasting for at least 8 hours.    Assessment:  Kenneth Franklin is a 74 y.o. 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  Pathology revealed keratinizing squamous cell  carcinoma, moderately differentiated.   Head MRI on 08/13/2020 revealed a 5.9 cm nasopharyngeal mass consistent with known nasopharyngeal carcinoma with involvement of the clivus and sphenoid sinus. There was moderate chronic small vessel ischemic disease and cerebral atrophy.  There was no intracranial extension.  PET scan on 08/18/2020 revealed a 5.7 x 4.2 cm destructive hypermetabolic mass (SUV 08.14) involving the sphenoid sinus, skull base and posterior nasopharynx.  This was consistent with patient's known squamous cell carcinoma. There was no locoregional lymphadenopathy or distant metastatic disease.  He began IMRT on 09/08/2020.  He is s/p 3 weeks of cisplatin (09/08/2020-09/22/2020).  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  Symptomatically, he has felt "ok". His appetite is poor because he cannot taste anything. He also reports mild nausea; he denies vomiting. He has been experimenting with his food. Headaches have resolved.  He denies any change in hearing.  Plan: 1.   Labs today: CBC with diff, CMP, Mg 2.   Clinical [id number] nasopharyngeal carcinoma  Tumor is unresectable.   PET scan revealed no evidence of disitant disease.  Treatment includes upfront radiation and chemotherapy followed by adjuvant chemotherapy   Cisplatin 40 mg/m2 weekly x 6 with radiation followed by cisplatin 80 mg/m2 D1 + 5FU 1000 mg/m2/day CI D1-4 q 28 days x 3 cycles  Baseline audiogram confirmed hearing loss associated with prior exposure.  Labs reviewed.  Begin week #4 cisplatin and radiation.   Patient notes a poor appetite secondary to loss of sense of taste.    He is experimenting with different foods.      Discuss seasonings to enhance food flavor.   Nausea is well controlled. 3.   Electrolyte issues  Potassium 4.1.  Magnesium 1.7.  No extra supplements needed today. 4.   Nutrition  consult. 5.   RTC in 1 week for MD assessment, labs (CBC with diff, CMP, Mg), and week #5 cisplatin.  I discussed the assessment and treatment plan with the patient.  The patient was provided an opportunity to ask questions and all were answered.  The patient agreed with the plan and demonstrated an understanding of the instructions.  The patient was advised to call back if the symptoms worsen or if the condition fails to improve as anticipated.   Jawann Urbani C. Mike Gip, MD, PhD    09/29/2020, 10:12 AM  I, Mirian Mo Tufford, am acting as Education administrator for [employer]. Mike Gip, MD, PhD.  I, Haidy Kackley C. Mike Gip, MD, have reviewed the above documentation for accuracy and completeness, and I agree with the above.

## 2020-09-29 ENCOUNTER — Ambulatory Visit: Payer: Medicare Other

## 2020-09-29 ENCOUNTER — Inpatient Hospital Stay: Payer: Medicare Other | Attending: Hematology and Oncology | Admitting: Hematology and Oncology

## 2020-09-29 ENCOUNTER — Inpatient Hospital Stay: Payer: Medicare Other

## 2020-09-29 ENCOUNTER — Ambulatory Visit
Admission: RE | Admit: 2020-09-29 | Discharge: 2020-09-29 | Disposition: A | Discharge status: Home / Self Care | Payer: Medicare Other | Source: Ambulatory Visit | Attending: Radiation Oncology | Admitting: Radiation Oncology

## 2020-09-29 ENCOUNTER — Encounter: Payer: Self-pay | Admitting: Hematology and Oncology

## 2020-09-29 ENCOUNTER — Other Ambulatory Visit: Payer: Self-pay

## 2020-09-29 VITALS — BP 126/70 | HR 82

## 2020-09-29 VITALS — BP 120/71 | HR 94 | Temp 96.0°F | Resp 18 | Wt 170.0 lb

## 2020-09-29 DIAGNOSIS — Z5111 Encounter for antineoplastic chemotherapy: Secondary | ICD-10-CM

## 2020-09-29 DIAGNOSIS — E876 Hypokalemia: Secondary | ICD-10-CM | POA: Diagnosis not present

## 2020-09-29 DIAGNOSIS — H9190 Unspecified hearing loss, unspecified ear: Secondary | ICD-10-CM | POA: Diagnosis not present

## 2020-09-29 DIAGNOSIS — C119 Malignant neoplasm of nasopharynx, unspecified: Secondary | ICD-10-CM

## 2020-09-29 DIAGNOSIS — Z79899 Other long term (current) drug therapy: Secondary | ICD-10-CM | POA: Diagnosis not present

## 2020-09-29 DIAGNOSIS — R630 Anorexia: Secondary | ICD-10-CM

## 2020-09-29 LAB — CBC WITH DIFFERENTIAL/PLATELET
Abs Immature Granulocytes: 0.03 10*3/uL (ref 0.00–0.07)
Basophils Absolute: 0 10*3/uL (ref 0.0–0.1)
Basophils Relative: 1 %
Eosinophils Absolute: 0 10*3/uL (ref 0.0–0.5)
Eosinophils Relative: 1 %
HCT: 37.4 % — ABNORMAL LOW (ref 39.0–52.0)
Hemoglobin: 12.5 g/dL — ABNORMAL LOW (ref 13.0–17.0)
Immature Granulocytes: 1 %
Lymphocytes Relative: 15 %
Lymphs Abs: 0.6 10*3/uL — ABNORMAL LOW (ref 0.7–4.0)
MCH: 29.7 pg (ref 26.0–34.0)
MCHC: 33.4 g/dL (ref 30.0–36.0)
MCV: 88.8 fL (ref 80.0–100.0)
Monocytes Absolute: 0.4 10*3/uL (ref 0.1–1.0)
Monocytes Relative: 9 %
Neutro Abs: 3.1 10*3/uL (ref 1.7–7.7)
Neutrophils Relative %: 73 %
Platelets: 198 10*3/uL (ref 150–400)
RBC: 4.21 MIL/uL — ABNORMAL LOW (ref 4.22–5.81)
RDW: 12.8 % (ref 11.5–15.5)
WBC: 4.2 10*3/uL (ref 4.0–10.5)
nRBC: 0 % (ref 0.0–0.2)

## 2020-09-29 LAB — BASIC METABOLIC PANEL
Anion gap: 10 (ref 5–15)
BUN: 18 mg/dL (ref 8–23)
CO2: 27 mmol/L (ref 22–32)
Calcium: 9.2 mg/dL (ref 8.9–10.3)
Chloride: 93 mmol/L — ABNORMAL LOW (ref 98–111)
Creatinine, Ser: 0.76 mg/dL (ref 0.61–1.24)
GFR, Estimated: >60 mL/min (ref >60)
Glucose, Bld: 238 mg/dL — ABNORMAL HIGH (ref 70–99)
Potassium: 4.1 mmol/L (ref 3.5–5.1)
Sodium: 130 mmol/L — ABNORMAL LOW (ref 135–145)

## 2020-09-29 LAB — MAGNESIUM: Magnesium: 1.7 mg/dL (ref 1.7–2.4)

## 2020-09-29 MED ORDER — HEPARIN SOD (PORK) LOCK FLUSH 100 UNIT/ML IV SOLN
500.0000 [IU] | Freq: Once | INTRAVENOUS | Status: AC | PRN
  Administered 2020-09-29: 500 [IU]
  Filled 2020-09-29: qty 5

## 2020-09-29 MED ORDER — SODIUM CHLORIDE 0.9 % IV SOLN
10.0000 mg | Freq: Once | INTRAVENOUS | Status: AC
  Administered 2020-09-29: 10 mg via INTRAVENOUS
  Filled 2020-09-29: qty 10

## 2020-09-29 MED ORDER — SODIUM CHLORIDE 0.9 % IV SOLN
150.0000 mg | Freq: Once | INTRAVENOUS | Status: AC
  Administered 2020-09-29: 150 mg via INTRAVENOUS
  Filled 2020-09-29: qty 150

## 2020-09-29 MED ORDER — SODIUM CHLORIDE 0.9 % IV SOLN
40.0000 mg/m2 | Freq: Once | INTRAVENOUS | Status: AC
  Administered 2020-09-29: 80 mg via INTRAVENOUS
  Filled 2020-09-29: qty 80

## 2020-09-29 MED ORDER — SODIUM CHLORIDE 0.9% FLUSH
10.0000 mL | INTRAVENOUS | Status: DC | PRN
  Administered 2020-09-29: 10 mL
  Filled 2020-09-29: qty 10

## 2020-09-29 MED ORDER — PALONOSETRON HCL INJECTION 0.25 MG/5ML
0.2500 mg | Freq: Once | INTRAVENOUS | Status: AC
  Administered 2020-09-29: 0.25 mg via INTRAVENOUS
  Filled 2020-09-29: qty 5

## 2020-09-29 MED ORDER — SODIUM CHLORIDE 0.9 % IV SOLN
Freq: Once | INTRAVENOUS | Status: AC
  Filled 2020-09-29: qty 250

## 2020-09-29 MED ORDER — SODIUM CHLORIDE 0.9 % IV SOLN
Freq: Once | INTRAVENOUS | Status: AC
  Filled 2020-09-29: qty 10

## 2020-09-29 NOTE — Progress Notes (Signed)
Patient received prescribed treatment in clinic; tolerated well. Discharged in stable condition.

## 2020-09-29 NOTE — Progress Notes (Signed)
Patient reports no appetite and nausea.

## 2020-09-30 ENCOUNTER — Ambulatory Visit
Admission: RE | Admit: 2020-09-30 | Discharge: 2020-09-30 | Disposition: A | Discharge status: Home / Self Care | Payer: Medicare Other | Source: Ambulatory Visit | Attending: Radiation Oncology | Admitting: Radiation Oncology

## 2020-09-30 ENCOUNTER — Ambulatory Visit: Payer: Medicare Other

## 2020-09-30 DIAGNOSIS — C119 Malignant neoplasm of nasopharynx, unspecified: Secondary | ICD-10-CM | POA: Diagnosis not present

## 2020-10-01 ENCOUNTER — Ambulatory Visit: Payer: Medicare Other

## 2020-10-01 ENCOUNTER — Other Ambulatory Visit: Payer: Self-pay | Admitting: *Deleted

## 2020-10-01 ENCOUNTER — Ambulatory Visit
Admission: RE | Admit: 2020-10-01 | Discharge: 2020-10-01 | Disposition: A | Discharge status: Home / Self Care | Payer: Medicare Other | Source: Ambulatory Visit | Attending: Radiation Oncology | Admitting: Radiation Oncology

## 2020-10-01 DIAGNOSIS — C119 Malignant neoplasm of nasopharynx, unspecified: Secondary | ICD-10-CM | POA: Diagnosis not present

## 2020-10-02 ENCOUNTER — Ambulatory Visit
Admission: RE | Admit: 2020-10-02 | Discharge: 2020-10-02 | Disposition: A | Discharge status: Home / Self Care | Payer: Medicare Other | Source: Ambulatory Visit | Attending: Radiation Oncology | Admitting: Radiation Oncology

## 2020-10-02 ENCOUNTER — Ambulatory Visit: Payer: Medicare Other

## 2020-10-02 DIAGNOSIS — C119 Malignant neoplasm of nasopharynx, unspecified: Secondary | ICD-10-CM | POA: Diagnosis not present

## 2020-10-05 ENCOUNTER — Inpatient Hospital Stay: Payer: Medicare Other | Attending: Hematology and Oncology

## 2020-10-05 ENCOUNTER — Ambulatory Visit: Payer: Medicare Other

## 2020-10-05 ENCOUNTER — Ambulatory Visit
Admission: RE | Admit: 2020-10-05 | Discharge: 2020-10-05 | Disposition: A | Discharge status: Home / Self Care | Payer: Medicare Other | Source: Ambulatory Visit | Attending: Radiation Oncology | Admitting: Radiation Oncology

## 2020-10-05 DIAGNOSIS — C119 Malignant neoplasm of nasopharynx, unspecified: Secondary | ICD-10-CM | POA: Diagnosis not present

## 2020-10-05 NOTE — Progress Notes (Signed)
[facility]  [address] [place], [place] [address] Phone: [phone]  Fax: [phone]   Clinic Day:  10/06/2020  Referring physician: Verita Lamb, NP  Chief Complaint: [name] is a 74 y.o. male with stage III nasopharyngeal carcinoma who is seen for assessment prior to week #5 cisplatin and concurrent radiation.  HPI: The patient was last seen in the oncology clinic on 09/29/2020. At that time,he felt "ok". Appetite was poor secondary to a lack of taste.  His headache had resolved.  He denied any change in hearing.  Hematocrit was 37.4, hemoglobin 12.5, MCV 88.8, platelets 198,000, WBC 4,200 (ANC 3100). Sodium was 130. Glucose was 238. Magnesium was 1.7. He received week #4 cisplatin. Nutrition was consulted.  The patient began IMRT treatment on 09/08/2020. He continues radiation Monday-Friday.  During the interim, he has felt "about the same".   Appetite is poor. He denies any diarrhea.  He has a little bit of nausea.  He has talked with the nutritionist; he has not tried her suggestions. He states that his daughter brings in home cooked meals. He is drinking Ensure 3 times a day. He denies any sore throat. Mouth is dry.  He denies any change in hearing.   Past Medical History:  Diagnosis Date  . Acute ischemic stroke ([place]) 2017  . Alcohol abuse   . Anxiety   . Cancer ([place])   . Depression   . Diabetes mellitus without complication ([place])   . Dyspnea    pcp knows and ordered rescue inhaler  . Hyperlipidemia   . Hypertension   . Skin cancer    Squamous Cell Carcinoma In Situ    Past Surgical History:  Procedure Laterality Date  . CATARACT EXTRACTION Bilateral   . COLONOSCOPY    . NASOPHARYNGOSCOPY N/A 08/12/2020   Procedure: ENDOSCOPIC NASOPHARYNGOSCOPY WITH BIOPSY;  Surgeon: Margaretha Sheffield, MD;  Location: [facility] ORS;  Service: ENT;  Laterality: N/A;  . PORTA CATH INSERTION N/A 08/24/2020   Procedure: PORTA CATH INSERTION;   Surgeon: Algernon Huxley, MD;  Location: [place] [facility];  Service: Cardiovascular;  Laterality: N/A;    Family History  Problem Relation Age of Onset  . Diabetes Brother     Social History:  reports that he quit smoking about 14 months ago. His smoking use included cigars and cigarettes. He has a 40.00 pack-year smoking history. He has never used smokeless tobacco. He reports previous alcohol use. He reports that he does not use drugs. He smoked 1 pack/day x 40 years.  He denies exposure of radiation or toxins; he did work with asphalt. He lives alone in [place]. He is widowed. The patient is alone today.  Allergies:  Allergies  Allergen Reactions  . Propoxyphene     Unknown reaction    Current Medications: Current Outpatient Medications  Medication Sig Dispense Refill  . ALPRAZolam (XANAX) 0.25 MG tablet TAKE 1 TABLET EVERY DAY AS NEEDED FOR ANXIETY (Patient taking differently: Take 0.25 mg by mouth daily as needed for anxiety. ) 30 tablet 2  . aspirin EC 81 MG tablet Take 81 mg by mouth daily. Swallow whole.    Marland Kitchen atorvastatin (LIPITOR) 80 MG tablet TAKE 1 TABLET (80 MG TOTAL) BY MOUTH NIGHTLY. (Patient taking differently: Take 80 mg by mouth every evening. ) 90 tablet 0  . busPIRone (BUSPAR) 10 MG tablet TAKE 1 TABLET (10 MG TOTAL) BY MOUTH 2 (TWO) TIMES DAILY. 180 tablet 0  . clopidogrel (  PLAVIX) 75 MG tablet TAKE 1 TABLET (75 MG TOTAL) BY MOUTH DAILY. 90 tablet 0  . escitalopram (LEXAPRO) 10 MG tablet Take 1 tablet (10 mg total) by mouth daily. Stop your lexapro in a week 6 tablet 0  . escitalopram (LEXAPRO) 20 MG tablet Take 20 mg by mouth every morning.     . fluticasone (FLOVENT HFA) 110 MCG/ACT inhaler Inhale 2 puffs into the lungs daily as needed (shortness of breath).    Marland Kitchen glipiZIDE (GLUCOTROL) 5 MG tablet Take 5 mg by mouth daily before breakfast.     . lidocaine-prilocaine (EMLA) cream Apply 1 application topically as needed. Apply small amount to port site at least 1  hour prior to it being accessed, cover with plastic wrap 30 g 1  . losartan (COZAAR) 100 MG tablet TAKE 1 TABLET (100 MG TOTAL) BY MOUTH DAILY. (Patient taking differently: Take 100 mg by mouth every morning. ) 90 tablet 0  . metFORMIN (GLUCOPHAGE) 500 MG tablet Take 1 tablet (500 mg total) by mouth daily with breakfast. 90 tablet 0  . mirtazapine (REMERON) 30 MG tablet Take 1 tablet (30 mg total) by mouth at bedtime. (Patient taking differently: Take 15 mg by mouth at bedtime. ) 30 tablet 0  . pantoprazole (PROTONIX) 20 MG tablet Take 20 mg by mouth daily before lunch.     . sitaGLIPtin (JANUVIA) 100 MG tablet Take 100 mg by mouth every morning.     . sucralfate (CARAFATE) 1 g tablet Take 1 tablet (1 g total) by mouth 4 (four) times daily. 120 tablet 11  . VITAMIN D PO Take 1 capsule by mouth daily.    . DENTA 5000 PLUS 1.1 % CREA dental cream Place 1 application onto teeth at bedtime.     Marland Kitchen dexamethasone (DECADRON) 4 MG tablet Take 2 tablets (8 mg total) by mouth daily. Take daily x 3 days starting the day after cisplatin chemotherapy. Take with food. 30 tablet 1  . HYDROcodone-acetaminophen (NORCO) 10-325 MG tablet Take 0.5 tablets by mouth every 4 (four) hours as needed for severe pain. 30 tablet 0  . magic mouthwash w/lidocaine SOLN Take 5 mLs by mouth 4 (four) times daily as needed for mouth pain. Sig: Swish/Spit 5-10 ml four times a day as needed. Dispense 480 ml. 1RF 480 mL 1  . magnesium oxide (MAG-OX) 400 MG tablet Take 1 tablet by mouth daily. (Patient not taking: Reported on 10/20/2020)    . magnesium oxide (MAG-OX) 400 MG tablet TAKE 1 TABLET BY MOUTH EVERY DAY 30 tablet 0  . nystatin cream (MYCOSTATIN) Apply 1 application topically 2 (two) times daily as needed (yeast).     . ondansetron (ZOFRAN) 8 MG tablet Take 1 tablet (8 mg total) by mouth 2 (two) times daily as needed. Start on the third day after cisplatin chemotherapy. 30 tablet 1  . prochlorperazine (COMPAZINE) 10 MG tablet Take  1 tablet (10 mg total) by mouth every 6 (six) hours as needed (Nausea or vomiting). 30 tablet 1  . sucralfate (CARAFATE) 1 g tablet Take 1 tablet (1 g total) by mouth 3 (three) times daily. Dissolve in 3-4 tbsp warm water, swish and swallow. 90 tablet 3   No current facility-administered medications for this visit.    Review of Systems  Constitutional: Positive for weight loss (3 pounds). Negative for chills, diaphoresis, fever and malaise/fatigue.  HENT: Negative for congestion, ear discharge, hearing loss, nosebleeds, sinus pain, sore throat and tinnitus.  No sense of taste.  Eyes: Negative.  Negative for blurred vision, double vision and photophobia.  Respiratory: Positive for shortness of breath (on exertion). Negative for cough, hemoptysis, sputum production and wheezing.   Cardiovascular: Negative.  Negative for chest pain, palpitations and leg swelling.  Gastrointestinal: Positive for nausea (little). Negative for abdominal pain, blood in stool, constipation, diarrhea, heartburn, melena and vomiting.       Poor appetite.  Genitourinary: Negative.  Negative for dysuria, frequency, hematuria and urgency.  Musculoskeletal: Negative.  Negative for back pain, joint pain, myalgias and neck pain.  Skin: Negative.  Negative for itching and rash.  Neurological: Negative.  Negative for dizziness, tingling, tremors, sensory change, speech change, focal weakness, weakness and headaches.  Endo/Heme/Allergies: Negative.  Does not bruise/bleed easily.  Psychiatric/Behavioral: Negative.  Negative for depression and memory loss. The patient is not nervous/anxious and does not have insomnia.   All other systems reviewed and are negative.  Performance status (ECOG): 1  Vitals Blood pressure 108/71, pulse 94, temperature (!) 97 F (36.1 C), temperature source Tympanic, resp. rate 18, height 6' (1.829 m), weight 167 lb 8.1 oz (76 kg), SpO2 99 %.   Physical Exam Vitals and nursing note reviewed.   Constitutional:      General: He is not in acute distress.    Appearance: Normal appearance. He is well-developed.     Interventions: Face mask in place.  HENT:     Head: Normocephalic and atraumatic.     Comments: Cap.  Short gray hair.    Right Ear: Hearing normal.     Left Ear: Hearing normal.     Mouth/Throat:     Mouth: Mucous membranes are dry. No oral lesions.  Eyes:     General: No scleral icterus.    Extraocular Movements: Extraocular movements intact.     Conjunctiva/sclera: Conjunctivae normal.     Pupils: Pupils are equal, round, and reactive to light.     Comments: Blue eyes.  Cardiovascular:     Rate and Rhythm: Normal rate and regular rhythm.     Heart sounds: Normal heart sounds. No murmur heard.  No friction rub. No gallop.   Pulmonary:     Effort: Pulmonary effort is normal.     Breath sounds: Normal breath sounds. No wheezing, rhonchi or rales.  Abdominal:     General: Bowel sounds are normal. There is no distension.     Palpations: Abdomen is soft. There is no hepatomegaly, splenomegaly or mass.     Tenderness: There is no abdominal tenderness. There is no guarding or rebound.  Musculoskeletal:        General: No swelling. Normal range of motion.     Cervical back: Normal range of motion and neck supple.  Lymphadenopathy:     Head:     Right side of head: No preauricular, posterior auricular or occipital adenopathy.     Left side of head: No preauricular, posterior auricular or occipital adenopathy.     Cervical: No cervical adenopathy.     Upper Body:     Right upper body: No supraclavicular or axillary adenopathy.     Left upper body: No supraclavicular or axillary adenopathy.     Lower Body: No right inguinal adenopathy. No left inguinal adenopathy.  Skin:    General: Skin is warm and dry.     Findings: No bruising, erythema or lesion.  Neurological:     Mental Status: He is alert and oriented to person, place, and time.  Mental status is at  baseline.  Psychiatric:        Behavior: Behavior normal.        Thought Content: Thought content normal.        Judgment: Judgment normal.    No visits with results within 3 Day(s) from this visit.  Latest known visit with results is:  Admission on 08/12/2020, Discharged on 08/12/2020  Component Date Value Ref Range Status  . Sodium 08/12/2020 132* 135 - 145 mmol/L Final  . Potassium 08/12/2020 4.2  3.5 - 5.1 mmol/L Final  . Chloride 08/12/2020 96* 98 - 111 mmol/L Final  . CO2 08/12/2020 27  22 - 32 mmol/L Final  . Glucose, Bld 08/12/2020 374* 70 - 99 mg/dL Final   Glucose reference range applies only to samples taken after fasting for at least 8 hours.  . BUN 08/12/2020 7* 8 - 23 mg/dL Final  . Creatinine, Ser 08/12/2020 0.84  0.61 - 1.24 mg/dL Final  . Calcium 08/12/2020 8.5* 8.9 - 10.3 mg/dL Final  . GFR calc non Af Amer 08/12/2020 >60  >60 mL/min Final  . GFR calc Af Amer 08/12/2020 >60  >60 mL/min Final  . Anion gap 08/12/2020 9  5 - 15 Final   Performed at [facility], [address] [place], [place] [address]  . WBC 08/12/2020 5.5  4.0 - 10.5 K/uL Final  . RBC 08/12/2020 4.46  4.22 - 5.81 MIL/uL Final  . Hemoglobin 08/12/2020 13.2  13.0 - 17.0 g/dL Final  . HCT 08/12/2020 39.3  39 - 52 % Final  . MCV 08/12/2020 88.1  80.0 - 100.0 fL Final  . MCH 08/12/2020 29.6  26.0 - 34.0 pg Final  . MCHC 08/12/2020 33.6  30.0 - 36.0 g/dL Final  . RDW 08/12/2020 12.6  11.5 - 15.5 % Final  . Platelets 08/12/2020 178  150 - 400 K/uL Final  . nRBC 08/12/2020 0.0  0.0 - 0.2 % Final   Performed at [facility], [address] [place], [place] [address]  . Glucose-Capillary 08/12/2020 368* 70 - 99 mg/dL Final   Glucose reference range applies only to samples taken after fasting for at least 8 hours.  . Glucose-Capillary 08/12/2020 333* 70 - 99 mg/dL Final   Glucose reference range applies only to samples taken after fasting for at least 8 hours.  . SURGICAL PATHOLOGY  08/12/2020    Final-Edited                   Value:SURGICAL PATHOLOGY CASE: [id number] PATIENT: Franklyn Lor Surgical Pathology Report  Specimen Submitted: A. Nasopharyngeal tumor  Clinical History: Malignant neoplasm nasopharynx, sphenoid sinus.  DIAGNOSIS: A. NASOPHARYNGEAL TUMOR; BIOPSY: - KERATINIZING SQUAMOUS CELL CARCINOMA, MODERATELY DIFFERENTIATED.  GROSS DESCRIPTION: Intraoperative Consultation:     Labeled: Nasopharyngeal tumor     Received: Fresh     Specimen: Nasopharyngoscopy with biopsy     Pathologic evaluation performed: Frozen section diagnosis     Diagnosis: FSA, representative section: Squamous cell carcinoma     Communicated to: Called to Dr. Kathyrn Sheriff at 11:36 AM on 08/12/2020 Quay Burow M.D.     Tissue submitted: A touch preparation with 1 Diff-Quik stained slide is performed.  Additionally, representative sections of the specimen are frozen as FSA1 and 3 frozen section slides are performed.  A. Labeled: Nasopharyngeal tumor Received: Fresh Tissue fragment(s): Multiple  Size: Aggregate, 1.4 x 1.4 x 0.4 cm Description: Received fresh on a Telfa pad are fragments of pink soft tissue and blood clot.  A touch preparation with 1 Diff-Quik stained slide is performed.  Representative sections are frozen as FSA1 with 3 frozen section slides performed.  The frozen section remnant is submitted in cassette 1 and the remainder of the specimen is submitted in cassette 2.  Final Diagnosis performed by Quay Burow, MD.   Electronically signed 08/13/2020 9:25:55AM The electronic signature indicates that the named Attending Pathologist has evaluated the specimen Technical component performed at [facility], [address] [place], [place] [address] Lab: [phone] Dir: Rush Farmer, MD, MMM  Professional component performed at [facility], [facility], [place], [place], [place] [address] Lab: [phone] Dir: Dellia Nims. Rubinas, MD  . Glucose-Capillary 08/12/2020 275* 70 - 99 mg/dL Final   Glucose reference range applies only to samples taken after fasting for at least 8 hours.  . Glucose-Capillary 08/12/2020 246* 70 - 99 mg/dL Final   Glucose reference range applies only to samples taken after fasting for at least 8 hours.    Assessment:  Kenneth [place] is a 74 y.o. 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  Pathology revealed keratinizing squamous cell carcinoma, moderately differentiated.   Head MRI on 08/13/2020 revealed a 5.9 cm nasopharyngeal mass consistent with known nasopharyngeal carcinoma with involvement of the clivus and sphenoid sinus. There was moderate chronic small vessel ischemic disease and cerebral atrophy.  There was no intracranial extension.  PET scan on 08/18/2020 revealed a 5.7 x 4.2 cm destructive hypermetabolic mass (SUV 48.54) involving the sphenoid sinus, skull base and posterior nasopharynx.  This was consistent with patient's known squamous cell carcinoma. There was no locoregional lymphadenopathy or distant metastatic disease.  He began IMRT on 09/08/2020.  He is s/p 4 weeks of cisplatin (09/08/2020 - 09/29/2020).  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  Symptomatically, he notes little nausea.  Appetite is poor.  He has lost 3 pounds.  Exam is stable.  Plan: 1.   Labs today: CBC with diff, CMP, Mg.  2.   Clinical [id number] nasopharyngeal carcinoma  Tumor is unresectable.  PET scan reveals no evidence of distant disease.  Treatment includes upfront radiation and chemotherapy followed by adjuvant chemotherapy    Cisplatin 40 mg/m2 weekly x 6-7 with radiation followed by cisplatin 80 mg/m2 D1 + 5FU 1000 mg/m2/day CI D1-4 q 28 days x 3 cycles.   Cisplatin and 5FU initiated at least 4 weeks following completion of concurrent  radiation and cisplatin.  Baseline audiogram confirmed hearing loss associated with prior exposure.  Labs reviewed.  Begin week #5 cisplatin and radiation.  Phone follow-up with radiation therapy regarding last day of radiation.  Discuss symptom management.  He has antiemetics at home to use on a prn bases.  Interventions are adequate.    3.   Poor appetite  Patient has lost 3 pounds in the past week.  Patient has little nausea but no taste for food.  Review suggestions by nutrition.  Continue Ensure/Boost. 4.   Hypomagnesemia  Magnesium 1.4.  Magnesium 2 g IV today.  Begin magnesium oxide 400 mg p.o. daily beginning 10/07/2020. 5.   Week #5 cisplatin today. 6.   RTC in 1 week for MD assessment, labs (CBC with diff, CMP, Mg), and week #6 cisplatin.  Addendum:  Patient did not receive chemotherapy today.  Despite 2 hours of prehydration and Aloxi for nausea, he did not void well enough for cisplatin.  He was also not feeling well.  He asked to return next week for treatment.  He was assessed twice while he was in the infusion center.  Hydration was encouraged.  Patient continued hydration without chemotherapy while in clinic.  I discussed the assessment and treatment plan with the patient.  The patient was provided an opportunity to ask questions and all were answered.  The patient agreed with the plan and demonstrated an understanding of the instructions.  The patient was advised to call back if the symptoms worsen or if the condition fails to improve as anticipated.   Wiliam Cauthorn C. Mike Gip, MD, PhD    10/06/2020 7:50 PM

## 2020-10-05 NOTE — Progress Notes (Signed)
Nutrition Assessment   Reason for Assessment:  Ana, RN for taste change   ASSESSMENT:  74 year old male with stage III nasopharyngeal carcinoma.  Past medical history of stroke, DM, etoh use, HLD, HTN.  Patient receiving concurrent chemotherapy and radiation therapy.    Spoke with patient following radiation.  Patient reports that he has no taste and is having hard time eating.  Reports that he is able to taste cookies. Drinking ensure/boost shakes, likes the chocolate.  Reports that his daughter brings him home cooked meal every night but he can't eat it because it has not taste.  Reports that he is drinking about 3 ensure/boost shakes per day.  Also reports dry mouth but denies trouble swallowing.     Medications: glipizide, remeron, metformin, compazine, januvia, carafate   Labs: glucose 238   Anthropometrics:   Height: 72 inches Weight: 170 lb 11/2 Per chart weight 170-175 lb, outlier of 180 lb on 10/12, ?? accuracy BMI: 23   NUTRITION DIAGNOSIS: Inadequate oral intake related to cancer related treatment side effects (taste alterations) as evidenced by poor po intake   INTERVENTION:  Discussed strategies to help with taste change (good oral care, trying different flavorings on foods to enhance taste, etc).  Handout provided.  Encouraged high calorie shake (350 or higher) as patient with limited intake.  Informed patient that these shakes are high in carbohydrate/sugars.  Would recommend liberalizing diet and controlling blood glucose with medications not diet restrictions at this time due to limited intake.   Gave samples of ensure complete and glucerna shake along with coupons.  Discussed strategies to help with dry mouth. Handout provided.  Contact information given to patient   MONITORING, EVALUATION, GOAL: weight trends, intake   Next Visit: Nov 22, phone f/u  Jayvan Mcshan B. Zenia Resides, [place], [place] Registered Dietitian [phone] (mobile)

## 2020-10-06 ENCOUNTER — Ambulatory Visit: Payer: Medicare Other

## 2020-10-06 ENCOUNTER — Other Ambulatory Visit: Payer: Self-pay

## 2020-10-06 ENCOUNTER — Inpatient Hospital Stay (HOSPITAL_BASED_OUTPATIENT_CLINIC_OR_DEPARTMENT_OTHER): Payer: Medicare Other | Admitting: Hematology and Oncology

## 2020-10-06 ENCOUNTER — Inpatient Hospital Stay: Payer: Medicare Other

## 2020-10-06 ENCOUNTER — Encounter: Payer: Self-pay | Admitting: Hematology and Oncology

## 2020-10-06 ENCOUNTER — Ambulatory Visit
Admission: RE | Admit: 2020-10-06 | Discharge: 2020-10-06 | Disposition: A | Discharge status: Home / Self Care | Payer: Medicare Other | Source: Ambulatory Visit | Attending: Radiation Oncology | Admitting: Radiation Oncology

## 2020-10-06 VITALS — BP 147/83 | HR 98 | Temp 96.7°F | Resp 22

## 2020-10-06 VITALS — BP 108/71 | HR 94 | Temp 97.0°F | Resp 18 | Ht 72.0 in | Wt 167.5 lb

## 2020-10-06 DIAGNOSIS — E860 Dehydration: Secondary | ICD-10-CM

## 2020-10-06 DIAGNOSIS — Z7189 Other specified counseling: Secondary | ICD-10-CM

## 2020-10-06 DIAGNOSIS — C119 Malignant neoplasm of nasopharynx, unspecified: Secondary | ICD-10-CM

## 2020-10-06 DIAGNOSIS — R110 Nausea: Secondary | ICD-10-CM

## 2020-10-06 DIAGNOSIS — R630 Anorexia: Secondary | ICD-10-CM

## 2020-10-06 DIAGNOSIS — Z5111 Encounter for antineoplastic chemotherapy: Secondary | ICD-10-CM

## 2020-10-06 LAB — COMPREHENSIVE METABOLIC PANEL
ALT: 31 U/L (ref 0–44)
AST: 25 U/L (ref 15–41)
Albumin: 3.8 g/dL (ref 3.5–5.0)
Alkaline Phosphatase: 97 U/L (ref 38–126)
Anion gap: 12 (ref 5–15)
BUN: 22 mg/dL (ref 8–23)
CO2: 28 mmol/L (ref 22–32)
Calcium: 9.5 mg/dL (ref 8.9–10.3)
Chloride: 92 mmol/L — ABNORMAL LOW (ref 98–111)
Creatinine, Ser: 0.85 mg/dL (ref 0.61–1.24)
GFR, Estimated: >60 mL/min (ref >60)
Glucose, Bld: 245 mg/dL — ABNORMAL HIGH (ref 70–99)
Potassium: 3.7 mmol/L (ref 3.5–5.1)
Sodium: 132 mmol/L — ABNORMAL LOW (ref 135–145)
Total Bilirubin: 0.6 mg/dL (ref 0.3–1.2)
Total Protein: 7.1 g/dL (ref 6.5–8.1)

## 2020-10-06 LAB — CBC WITH DIFFERENTIAL/PLATELET
Abs Immature Granulocytes: 0.04 10*3/uL (ref 0.00–0.07)
Basophils Absolute: 0 10*3/uL (ref 0.0–0.1)
Basophils Relative: 0 %
Eosinophils Absolute: 0 10*3/uL (ref 0.0–0.5)
Eosinophils Relative: 1 %
HCT: 34.7 % — ABNORMAL LOW (ref 39.0–52.0)
Hemoglobin: 11.8 g/dL — ABNORMAL LOW (ref 13.0–17.0)
Immature Granulocytes: 1 %
Lymphocytes Relative: 16 %
Lymphs Abs: 0.5 10*3/uL — ABNORMAL LOW (ref 0.7–4.0)
MCH: 30.2 pg (ref 26.0–34.0)
MCHC: 34 g/dL (ref 30.0–36.0)
MCV: 88.7 fL (ref 80.0–100.0)
Monocytes Absolute: 0.4 10*3/uL (ref 0.1–1.0)
Monocytes Relative: 10 %
Neutro Abs: 2.5 10*3/uL (ref 1.7–7.7)
Neutrophils Relative %: 72 %
Platelets: 133 10*3/uL — ABNORMAL LOW (ref 150–400)
RBC: 3.91 MIL/uL — ABNORMAL LOW (ref 4.22–5.81)
RDW: 13 % (ref 11.5–15.5)
WBC: 3.4 10*3/uL — ABNORMAL LOW (ref 4.0–10.5)
nRBC: 0 % (ref 0.0–0.2)

## 2020-10-06 LAB — MAGNESIUM: Magnesium: 1.4 mg/dL — ABNORMAL LOW (ref 1.7–2.4)

## 2020-10-06 MED ORDER — SODIUM CHLORIDE 0.9 % IV SOLN
150.0000 mg | Freq: Once | INTRAVENOUS | Status: DC
  Filled 2020-10-06: qty 5

## 2020-10-06 MED ORDER — HEPARIN SOD (PORK) LOCK FLUSH 100 UNIT/ML IV SOLN
500.0000 [IU] | Freq: Once | INTRAVENOUS | Status: AC | PRN
  Administered 2020-10-06: 500 [IU]
  Filled 2020-10-06: qty 5

## 2020-10-06 MED ORDER — PALONOSETRON HCL INJECTION 0.25 MG/5ML
INTRAVENOUS | Status: AC
  Filled 2020-10-06: qty 5

## 2020-10-06 MED ORDER — SODIUM CHLORIDE 0.9 % IV SOLN
10.0000 mg | Freq: Once | INTRAVENOUS | Status: DC
  Filled 2020-10-06: qty 1

## 2020-10-06 MED ORDER — PALONOSETRON HCL INJECTION 0.25 MG/5ML
0.2500 mg | Freq: Once | INTRAVENOUS | Status: AC
  Administered 2020-10-06: 0.25 mg via INTRAVENOUS

## 2020-10-06 MED ORDER — SODIUM CHLORIDE 0.9 % IV SOLN
Freq: Once | INTRAVENOUS | Status: AC
  Filled 2020-10-06: qty 10

## 2020-10-06 MED ORDER — MAGNESIUM OXIDE 400 (241.3 MG) MG PO TABS: 400.0000 mg | ORAL_TABLET | Freq: Every day | ORAL | 0 refills | Status: DC

## 2020-10-06 MED ORDER — SODIUM CHLORIDE 0.9 % IV SOLN
Freq: Once | INTRAVENOUS | Status: AC
  Filled 2020-10-06: qty 250

## 2020-10-06 MED ORDER — SODIUM CHLORIDE 0.9 % IV SOLN
40.0000 mg/m2 | Freq: Once | INTRAVENOUS | Status: DC
  Filled 2020-10-06: qty 80

## 2020-10-06 NOTE — Patient Instructions (Signed)
  Begin magnesium 400 mg by mouth daily beginning 10/07/2020 (Wednesday).  If you develop diarrhea, stop oral magnesium.

## 2020-10-06 NOTE — Progress Notes (Signed)
Pt d/ced home, did not finish tx today, only had 2 hours of prehydration and one dose of aloxi for nausea, pt did not void enough for cisplatin and c/o not feeling well, asked if he could come back for tx next week. Dr Mike Gip spoke with pt and the plan was made for no further tx today. VSS, pt accomp to lobby via w/c, awaiting ride

## 2020-10-06 NOTE — Progress Notes (Signed)
Not able to eat

## 2020-10-06 NOTE — Progress Notes (Signed)
Pt had c/o nausea during his prehydration, fluids stopped and aloxi given, pt has poor urine output, asked if he should come back a different day for chemo, message sent to Dr. Mike Gip,  [phone] Dr Mike Gip back to see pt, discussed possible options, we will continue with the hydration fluids but no chemo today

## 2020-10-07 ENCOUNTER — Ambulatory Visit: Payer: Medicare Other

## 2020-10-07 ENCOUNTER — Ambulatory Visit
Admission: RE | Admit: 2020-10-07 | Discharge: 2020-10-07 | Disposition: A | Discharge status: Home / Self Care | Payer: Medicare Other | Source: Ambulatory Visit | Attending: Radiation Oncology | Admitting: Radiation Oncology

## 2020-10-07 DIAGNOSIS — C119 Malignant neoplasm of nasopharynx, unspecified: Secondary | ICD-10-CM | POA: Diagnosis not present

## 2020-10-08 ENCOUNTER — Ambulatory Visit: Payer: Medicare Other

## 2020-10-08 ENCOUNTER — Ambulatory Visit
Admission: RE | Admit: 2020-10-08 | Discharge: 2020-10-08 | Disposition: A | Discharge status: Home / Self Care | Payer: Medicare Other | Source: Ambulatory Visit | Attending: Radiation Oncology | Admitting: Radiation Oncology

## 2020-10-08 DIAGNOSIS — C119 Malignant neoplasm of nasopharynx, unspecified: Secondary | ICD-10-CM | POA: Diagnosis not present

## 2020-10-09 ENCOUNTER — Ambulatory Visit: Payer: Medicare Other

## 2020-10-09 ENCOUNTER — Ambulatory Visit
Admission: RE | Admit: 2020-10-09 | Discharge: 2020-10-09 | Disposition: A | Discharge status: Home / Self Care | Payer: Medicare Other | Source: Ambulatory Visit | Attending: Radiation Oncology | Admitting: Radiation Oncology

## 2020-10-09 DIAGNOSIS — C119 Malignant neoplasm of nasopharynx, unspecified: Secondary | ICD-10-CM | POA: Diagnosis not present

## 2020-10-12 ENCOUNTER — Ambulatory Visit
Admission: RE | Admit: 2020-10-12 | Discharge: 2020-10-12 | Disposition: A | Discharge status: Home / Self Care | Payer: Medicare Other | Source: Ambulatory Visit | Attending: Radiation Oncology | Admitting: Radiation Oncology

## 2020-10-12 ENCOUNTER — Ambulatory Visit: Payer: Medicare Other

## 2020-10-12 DIAGNOSIS — C119 Malignant neoplasm of nasopharynx, unspecified: Secondary | ICD-10-CM | POA: Diagnosis not present

## 2020-10-12 NOTE — Progress Notes (Signed)
[facility]  [address] [place], [place] [address] Phone: [phone]  Fax: [phone]   Clinic Day:  10/13/2020  Referring physician: Verita Lamb, NP  Chief Complaint: Kenneth Franklin is a 74 y.o. male with stage III nasopharyngeal carcinoma who is seen for assessment prior to week #5 cisplatin and concurrent radiation.  HPI: The patient was last seen in the oncology clinic on 10/06/2020. At that time, he felt "about the same".  Oral intake was poor.  He noted little nausea.  Hematocrit was 34.7, hemoglobin 11.8, MCV 88.7, platelets 133,000, WBC 3,400 (ANC 2,500). Sodium was 132.  Glucose was 245. Magnesium was 1.4.  He experienced nausea during his prehydration fluids.  He did not receive chemotherapy secondary to poor urine output.  He received his premedication fluids with potassium and magnesium.  The patient began IMRT treatment on 09/08/2020. He has received25treatments to date; plan is for 36 treatments.  During the interim, he has been "hanging on". He has been drinking 3-4 Boosts per day. He is not eating very well. His shortness of breath is stable. He denies nausea.   He notes new buildup on his eyelashes. His eyes are itchy.  He urinates regularly throughout the day. He takes one magnesium pill daily.   Past Medical History:  Diagnosis Date  . Acute ischemic stroke ([place]) 2017  . Alcohol abuse   . Anxiety   . Cancer ([place])   . Depression   . Diabetes mellitus without complication ([place])   . Dyspnea    pcp knows and ordered rescue inhaler  . Hyperlipidemia   . Hypertension   . Skin cancer    Squamous Cell Carcinoma In Situ    Past Surgical History:  Procedure Laterality Date  . CATARACT EXTRACTION Bilateral   . COLONOSCOPY    . NASOPHARYNGOSCOPY N/A 08/12/2020   Procedure: ENDOSCOPIC NASOPHARYNGOSCOPY WITH BIOPSY;  Surgeon: Margaretha Sheffield, MD;  Location: [facility] ORS;  Service: ENT;  Laterality: N/A;  . PORTA CATH INSERTION  N/A 08/24/2020   Procedure: PORTA CATH INSERTION;  Surgeon: Algernon Huxley, MD;  Location: [facility];  Service: Cardiovascular;  Laterality: N/A;    Family History  Problem Relation Age of Onset  . Diabetes Brother     Social History:  reports that he quit smoking about 14 months ago. His smoking use included cigars and cigarettes. He has a 40.00 pack-year smoking history. He has never used smokeless tobacco. He reports previous alcohol use. He reports that he does not use drugs. He smoked 1 pack/day x 40 years.  He denies exposure of radiation or toxins; he did work with asphalt. He lives alone in [place]. He is widowed. The patient is alone today.  Allergies:  Allergies  Allergen Reactions  . Propoxyphene     Unknown reaction    Current Medications: Current Outpatient Medications  Medication Sig Dispense Refill  . ALPRAZolam (XANAX) 0.25 MG tablet TAKE 1 TABLET EVERY DAY AS NEEDED FOR ANXIETY (Patient taking differently: Take 0.25 mg by mouth daily as needed for anxiety. ) 30 tablet 2  . aspirin EC 81 MG tablet Take 81 mg by mouth daily. Swallow whole.    Marland Kitchen atorvastatin (LIPITOR) 80 MG tablet TAKE 1 TABLET (80 MG TOTAL) BY MOUTH NIGHTLY. (Patient taking differently: Take 80 mg by mouth every evening. ) 90 tablet 0  . busPIRone (BUSPAR) 10 MG tablet TAKE 1 TABLET (10 MG TOTAL) BY MOUTH 2 (TWO) TIMES DAILY. [place]  tablet 0  . clopidogrel (PLAVIX) 75 MG tablet TAKE 1 TABLET (75 MG TOTAL) BY MOUTH DAILY. 90 tablet 0  . DENTA 5000 PLUS 1.1 % CREA dental cream Place 1 application onto teeth at bedtime.     Marland Kitchen escitalopram (LEXAPRO) 20 MG tablet Take 20 mg by mouth every morning.     . fluticasone (FLOVENT HFA) 110 MCG/ACT inhaler Inhale 2 puffs into the lungs daily as needed (shortness of breath).    Marland Kitchen glipiZIDE (GLUCOTROL) 5 MG tablet Take 5 mg by mouth daily before breakfast.     . HYDROcodone-acetaminophen (NORCO) 10-325 MG tablet Take 0.5 tablets by mouth every 4 (four) hours as  needed for severe pain. 30 tablet 0  . lidocaine-prilocaine (EMLA) cream Apply 1 application topically as needed. Apply small amount to port site at least 1 hour prior to it being accessed, cover with plastic wrap 30 g 1  . losartan (COZAAR) 100 MG tablet TAKE 1 TABLET (100 MG TOTAL) BY MOUTH DAILY. (Patient taking differently: Take 100 mg by mouth every morning. ) 90 tablet 0  . magnesium oxide (MAG-OX) 400 (241.3 Mg) MG tablet Take 1 tablet (400 mg total) by mouth daily. 30 tablet 0  . metFORMIN (GLUCOPHAGE) 500 MG tablet Take 1 tablet (500 mg total) by mouth daily with breakfast. 90 tablet 0  . mirtazapine (REMERON) 30 MG tablet Take 1 tablet (30 mg total) by mouth at bedtime. (Patient taking differently: Take 15 mg by mouth at bedtime. ) 30 tablet 0  . nystatin cream (MYCOSTATIN) Apply 1 application topically 2 (two) times daily as needed (yeast).     . ondansetron (ZOFRAN) 8 MG tablet Take 1 tablet (8 mg total) by mouth 2 (two) times daily as needed. Start on the third day after cisplatin chemotherapy. 30 tablet 1  . pantoprazole (PROTONIX) 20 MG tablet Take 20 mg by mouth daily before lunch.     . prochlorperazine (COMPAZINE) 10 MG tablet Take 1 tablet (10 mg total) by mouth every 6 (six) hours as needed (Nausea or vomiting). 30 tablet 1  . sitaGLIPtin (JANUVIA) 100 MG tablet Take 100 mg by mouth every morning.     . sucralfate (CARAFATE) 1 g tablet Take 1 tablet (1 g total) by mouth 4 (four) times daily. 120 tablet 11  . VITAMIN D PO Take 1 capsule by mouth daily.    Marland Kitchen dexamethasone (DECADRON) 4 MG tablet Take 2 tablets (8 mg total) by mouth daily. Take daily x 3 days starting the day after cisplatin chemotherapy. Take with food. (Patient not taking: Reported on 10/06/2020) 30 tablet 1  . escitalopram (LEXAPRO) 10 MG tablet Take 1 tablet (10 mg total) by mouth daily. Stop your lexapro in a week 6 tablet 0   No current facility-administered medications for this visit.    Review of Systems    Constitutional: Negative for chills, diaphoresis, fever, malaise/fatigue and weight loss (up 3 lbs).       "Hanging on".  HENT: Negative for congestion, ear discharge, ear pain, hearing loss, nosebleeds, sinus pain, sore throat and tinnitus.   Eyes: Positive for discharge. Negative for blurred vision.       Eye itching.  Respiratory: Positive for shortness of breath (on exertion, stable). Negative for cough, hemoptysis and sputum production.   Cardiovascular: Negative.  Negative for chest pain, palpitations and leg swelling.  Gastrointestinal: Negative for abdominal pain, blood in stool, constipation, diarrhea, heartburn, melena, nausea and vomiting.       Poor  appetite. Drinks Boost 3-4 x per day  Genitourinary: Negative.  Negative for dysuria, frequency, hematuria and urgency.       Urinates regularly throughout the day.  Musculoskeletal: Negative.  Negative for back pain, joint pain, myalgias and neck pain.  Skin: Negative for itching and rash.  Neurological: Negative for dizziness, tingling, sensory change, weakness and headaches.  Endo/Heme/Allergies: Negative.  Does not bruise/bleed easily.  Psychiatric/Behavioral: Negative.  Negative for depression and memory loss. The patient is not nervous/anxious and does not have insomnia.   All other systems reviewed and are negative.  Performance status (ECOG): 1  Vitals Blood pressure 108/66, pulse 90, temperature 97.8 F (36.6 C), temperature source Tympanic, resp. rate 20, height 6' (1.829 m), weight 170 lb 3.2 oz (77.2 kg), SpO2 98 %.   Physical Exam Vitals and nursing note reviewed.  Constitutional:      General: He is not in acute distress.    Appearance: Normal appearance. He is well-developed.     Interventions: Face mask in place.     Comments: Gentleman sitting comfortably in wheelchair in no acute distress. Able to get onto table for exam.  HENT:     Head: Normocephalic and atraumatic.     Comments: Cap. Gray hair.    Right  Ear: Hearing normal.     Left Ear: Hearing normal.     Mouth/Throat:     Mouth: Mucous membranes are moist. No oral lesions.     Comments: Mild mucositis. Eyes:     General: No scleral icterus.    Pupils: Pupils are equal, round, and reactive to light.     Comments: Eyes are irritated.  Cardiovascular:     Rate and Rhythm: Normal rate and regular rhythm.     Heart sounds: Normal heart sounds. No murmur heard.  No friction rub. No gallop.   Pulmonary:     Effort: Pulmonary effort is normal.     Breath sounds: Normal breath sounds. No wheezing, rhonchi or rales.  Abdominal:     General: Bowel sounds are normal. There is no distension.     Palpations: Abdomen is soft. There is no hepatomegaly, splenomegaly or mass.     Tenderness: There is no abdominal tenderness. There is no guarding or rebound.  Musculoskeletal:        General: No tenderness. Normal range of motion.     Cervical back: Normal range of motion and neck supple.  Lymphadenopathy:     Head:     Right side of head: No preauricular, posterior auricular or occipital adenopathy.     Left side of head: No preauricular, posterior auricular or occipital adenopathy.     Cervical: No cervical adenopathy.     Upper Body:     Right upper body: No supraclavicular or axillary adenopathy.     Left upper body: No supraclavicular or axillary adenopathy.     Lower Body: No right inguinal adenopathy. No left inguinal adenopathy.  Skin:    General: Skin is warm and dry.     Findings: No bruising, erythema, lesion or rash.     Comments: Mild bilateral ruddy erythema on neck from radiation.   Neurological:     Mental Status: He is alert and oriented to person, place, and time.  Psychiatric:        Behavior: Behavior normal.        Thought Content: Thought content normal.        Judgment: Judgment normal.    No visits with  results within 3 Day(s) from this visit.  Latest known visit with results is:  Admission on 08/12/2020,  Discharged on 08/12/2020  Component Date Value Ref Range Status  . Sodium 08/12/2020 132* 135 - 145 mmol/L Final  . Potassium 08/12/2020 4.2  3.5 - 5.1 mmol/L Final  . Chloride 08/12/2020 96* 98 - 111 mmol/L Final  . CO2 08/12/2020 27  22 - 32 mmol/L Final  . Glucose, Bld 08/12/2020 374* 70 - 99 mg/dL Final   Glucose reference range applies only to samples taken after fasting for at least 8 hours.  . BUN 08/12/2020 7* 8 - 23 mg/dL Final  . Creatinine, Ser 08/12/2020 0.84  0.61 - 1.24 mg/dL Final  . Calcium 08/12/2020 8.5* 8.9 - 10.3 mg/dL Final  . GFR calc non Af Amer 08/12/2020 >60  >60 mL/min Final  . GFR calc Af Amer 08/12/2020 >60  >60 mL/min Final  . Anion gap 08/12/2020 9  5 - 15 Final   Performed at [facility], [address] [place], [place] [address]  . WBC 08/12/2020 5.5  4.0 - 10.5 K/uL Final  . RBC 08/12/2020 4.46  4.22 - 5.81 MIL/uL Final  . Hemoglobin 08/12/2020 13.2  13.0 - 17.0 g/dL Final  . HCT 08/12/2020 39.3  39 - 52 % Final  . MCV 08/12/2020 88.1  80.0 - 100.0 fL Final  . MCH 08/12/2020 29.6  26.0 - 34.0 pg Final  . MCHC 08/12/2020 33.6  30.0 - 36.0 g/dL Final  . RDW 08/12/2020 12.6  11.5 - 15.5 % Final  . Platelets 08/12/2020 178  150 - 400 K/uL Final  . nRBC 08/12/2020 0.0  0.0 - 0.2 % Final   Performed at [facility], [address], [place], [place] [address]  . Glucose-Capillary 08/12/2020 368* 70 - 99 mg/dL Final   Glucose reference range applies only to samples taken after fasting for at least 8 hours.  . Glucose-Capillary 08/12/2020 333* 70 - 99 mg/dL Final   Glucose reference range applies only to samples taken after fasting for at least 8 hours.  . SURGICAL PATHOLOGY 08/12/2020    Final-Edited                   Value:SURGICAL PATHOLOGY CASE: [id number] PATIENT: Franklyn Lor Surgical Pathology Report  Specimen Submitted: A. Nasopharyngeal tumor  Clinical History: Malignant neoplasm nasopharynx, sphenoid  sinus.  DIAGNOSIS: A. NASOPHARYNGEAL TUMOR; BIOPSY: - KERATINIZING SQUAMOUS CELL CARCINOMA, MODERATELY DIFFERENTIATED.  GROSS DESCRIPTION: Intraoperative Consultation:     Labeled: Nasopharyngeal tumor     Received: Fresh     Specimen: Nasopharyngoscopy with biopsy     Pathologic evaluation performed: Frozen section diagnosis     Diagnosis: FSA, representative section: Squamous cell carcinoma     Communicated to: Called to Dr. Kathyrn Sheriff at 11:36 AM on 08/12/2020 Quay Burow M.D.     Tissue submitted: A touch preparation with 1 Diff-Quik stained slide is performed.  Additionally, representative sections of the specimen are frozen as FSA1 and 3 frozen section slides are performed.  A. Labeled: Nasopharyngeal tumor Received: Fresh Tissue fragment(s): Multiple                          Size: Aggregate, 1.4 x 1.4 x 0.4 cm Description: Received fresh on a Telfa pad are fragments of pink soft tissue and blood clot.  A touch preparation with 1 Diff-Quik stained slide is performed.  Representative sections are frozen as FSA1  with 3 frozen section slides performed.  The frozen section remnant is submitted in cassette 1 and the remainder of the specimen is submitted in cassette 2.  Final Diagnosis performed by Quay Burow, MD.   Electronically signed 08/13/2020 9:25:55AM The electronic signature indicates that the named Attending Pathologist has evaluated the specimen Technical component performed at [facility], [address], [place], [place] [address] Lab: [phone] Dir: Rush Farmer, MD, MMM  Professional component performed at [facility], [facility], [place], [place], [place] [address] Lab: [phone] Dir: Dellia Nims. Rubinas, MD  . Glucose-Capillary 08/12/2020 275* 70 - 99 mg/dL Final   Glucose reference range applies only to samples taken after fasting for at least 8 hours.  . Glucose-Capillary 08/12/2020 246* 70 - 99 mg/dL Final   Glucose reference range  applies only to samples taken after fasting for at least 8 hours.    Assessment:  Kenneth Franklin is a 74 y.o. 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  Pathology revealed keratinizing squamous cell carcinoma, moderately differentiated.   Head MRI on 08/13/2020 revealed a 5.9 cm nasopharyngeal mass consistent with known nasopharyngeal carcinoma with involvement of the clivus and sphenoid sinus. There was moderate chronic small vessel ischemic disease and cerebral atrophy.  There was no intracranial extension.  PET scan on 08/18/2020 revealed a 5.7 x 4.2 cm destructive hypermetabolic mass (SUV 37.90) involving the sphenoid sinus, skull base and posterior nasopharynx.  This was consistent with patient's known squamous cell carcinoma. There was no locoregional lymphadenopathy or distant metastatic disease.  He began IMRT on 09/08/2020.  He is s/p 4 weeks of cisplatin (09/08/2020 - 09/29/2020).  He did not receive treatment on 10/06/2020 secondary to poor urine output.  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  Symptomatically, he is doing fair.  He is not eating well; he is drinking 3-4 Boosts/day.  He denies any nausea.  He is voiding well.  Plan: 1.   Labs today: CBC with diff, CMP, Mg 2.   Clinical [id number] nasopharyngeal carcinoma  Tumor is unresectable.             PET scan reveals no evidence of distant disease.  Treatment includes upfront radiation and chemotherapy followed by adjuvant chemotherapy   Cisplatin 40 mg/m2 weekly x 6-7 with radiation followed by cisplatin 80 mg/m2 D1 + 5FU 1000 mg/m2/day CI D1-4 q 28 days x 3 cycles  Baseline audiogram confirmed hearing loss associated with prior exposure.   Symptomatically, he is doing fair.  He denies any nausea.  He is voiding well.  Labs reviewed.  Begin week #5 cisplatin and radiation.  Discuss  symptom management.  He has antiemetics at home to use on a prn bases.  Interventions are adequate.    3.   Mild mucositis  Clinically, he has early mucositis from radiation.  Discuss oral hygiene. 4.   Hypomagnesemia  Magnesium is 1.7.  Continue magnesium oxide 400 mg a day. 5.   Week #5 cisplatin today. 6.   RTC in 1 week for MD assessment, labs (CBC with diff, CMP, Mg), and week #6 cisplatin.  I discussed the assessment and treatment plan with the patient.  The patient was provided an opportunity to ask questions and all were answered.  The patient agreed with the plan and demonstrated an understanding of the instructions.  The patient was advised to call back  if the symptoms worsen or if the condition fails to improve as anticipated.   Jerrold Haskell C. Mike Gip, MD, PhD    10/13/2020, 9:54 AM  I, Mirian Mo Tufford, am acting as Education administrator for [employer]. Mike Gip, MD, PhD.  I, Kalianne Fetting C. Mike Gip, MD, have reviewed the above documentation for accuracy and completeness, and I agree with the above.

## 2020-10-13 ENCOUNTER — Inpatient Hospital Stay: Payer: Medicare Other

## 2020-10-13 ENCOUNTER — Ambulatory Visit: Payer: Medicare Other

## 2020-10-13 ENCOUNTER — Encounter: Payer: Self-pay | Admitting: Hematology and Oncology

## 2020-10-13 ENCOUNTER — Inpatient Hospital Stay (HOSPITAL_BASED_OUTPATIENT_CLINIC_OR_DEPARTMENT_OTHER): Payer: Medicare Other | Admitting: Hematology and Oncology

## 2020-10-13 ENCOUNTER — Ambulatory Visit
Admission: RE | Admit: 2020-10-13 | Discharge: 2020-10-13 | Disposition: A | Discharge status: Home / Self Care | Payer: Medicare Other | Source: Ambulatory Visit | Attending: Radiation Oncology | Admitting: Radiation Oncology

## 2020-10-13 ENCOUNTER — Other Ambulatory Visit: Payer: Self-pay

## 2020-10-13 VITALS — BP 108/66 | HR 90 | Temp 97.8°F | Resp 20 | Ht 72.0 in | Wt 170.2 lb

## 2020-10-13 DIAGNOSIS — K1233 Oral mucositis (ulcerative) due to radiation: Secondary | ICD-10-CM | POA: Diagnosis not present

## 2020-10-13 DIAGNOSIS — C119 Malignant neoplasm of nasopharynx, unspecified: Secondary | ICD-10-CM

## 2020-10-13 DIAGNOSIS — Z5111 Encounter for antineoplastic chemotherapy: Secondary | ICD-10-CM | POA: Diagnosis not present

## 2020-10-13 LAB — CBC WITH DIFFERENTIAL/PLATELET
Abs Immature Granulocytes: 0.02 10*3/uL (ref 0.00–0.07)
Basophils Absolute: 0 10*3/uL (ref 0.0–0.1)
Basophils Relative: 1 %
Eosinophils Absolute: 0 10*3/uL (ref 0.0–0.5)
Eosinophils Relative: 1 %
HCT: 35.1 % — ABNORMAL LOW (ref 39.0–52.0)
Hemoglobin: 11.7 g/dL — ABNORMAL LOW (ref 13.0–17.0)
Immature Granulocytes: 1 %
Lymphocytes Relative: 12 %
Lymphs Abs: 0.4 10*3/uL — ABNORMAL LOW (ref 0.7–4.0)
MCH: 30.6 pg (ref 26.0–34.0)
MCHC: 33.3 g/dL (ref 30.0–36.0)
MCV: 91.9 fL (ref 80.0–100.0)
Monocytes Absolute: 0.3 10*3/uL (ref 0.1–1.0)
Monocytes Relative: 10 %
Neutro Abs: 2.7 10*3/uL (ref 1.7–7.7)
Neutrophils Relative %: 75 %
Platelets: 167 10*3/uL (ref 150–400)
RBC: 3.82 MIL/uL — ABNORMAL LOW (ref 4.22–5.81)
RDW: 14.6 % (ref 11.5–15.5)
WBC: 3.5 10*3/uL — ABNORMAL LOW (ref 4.0–10.5)
nRBC: 0 % (ref 0.0–0.2)

## 2020-10-13 LAB — COMPREHENSIVE METABOLIC PANEL
ALT: 25 U/L (ref 0–44)
AST: 23 U/L (ref 15–41)
Albumin: 3.9 g/dL (ref 3.5–5.0)
Alkaline Phosphatase: 82 U/L (ref 38–126)
Anion gap: 11 (ref 5–15)
BUN: 20 mg/dL (ref 8–23)
CO2: 25 mmol/L (ref 22–32)
Calcium: 9 mg/dL (ref 8.9–10.3)
Chloride: 96 mmol/L — ABNORMAL LOW (ref 98–111)
Creatinine, Ser: 0.92 mg/dL (ref 0.61–1.24)
GFR, Estimated: >60 mL/min (ref >60)
Glucose, Bld: 180 mg/dL — ABNORMAL HIGH (ref 70–99)
Potassium: 4.2 mmol/L (ref 3.5–5.1)
Sodium: 132 mmol/L — ABNORMAL LOW (ref 135–145)
Total Bilirubin: 0.6 mg/dL (ref 0.3–1.2)
Total Protein: 7.4 g/dL (ref 6.5–8.1)

## 2020-10-13 LAB — MAGNESIUM: Magnesium: 1.7 mg/dL (ref 1.7–2.4)

## 2020-10-13 MED ORDER — PALONOSETRON HCL INJECTION 0.25 MG/5ML
0.2500 mg | Freq: Once | INTRAVENOUS | Status: AC
  Administered 2020-10-13: 0.25 mg via INTRAVENOUS
  Filled 2020-10-13: qty 5

## 2020-10-13 MED ORDER — HEPARIN SOD (PORK) LOCK FLUSH 100 UNIT/ML IV SOLN
500.0000 [IU] | Freq: Once | INTRAVENOUS | Status: AC | PRN
  Administered 2020-10-13: 500 [IU]
  Filled 2020-10-13: qty 5

## 2020-10-13 MED ORDER — SODIUM CHLORIDE 0.9% FLUSH
10.0000 mL | INTRAVENOUS | Status: DC | PRN
  Filled 2020-10-13: qty 10

## 2020-10-13 MED ORDER — SODIUM CHLORIDE 0.9 % IV SOLN
10.0000 mg | Freq: Once | INTRAVENOUS | Status: AC
  Administered 2020-10-13: 10 mg via INTRAVENOUS
  Filled 2020-10-13: qty 1

## 2020-10-13 MED ORDER — SODIUM CHLORIDE 0.9 % IV SOLN
Freq: Once | INTRAVENOUS | Status: AC
  Filled 2020-10-13: qty 250

## 2020-10-13 MED ORDER — SODIUM CHLORIDE 0.9 % IV SOLN
150.0000 mg | Freq: Once | INTRAVENOUS | Status: AC
  Administered 2020-10-13: 150 mg via INTRAVENOUS
  Filled 2020-10-13: qty 5

## 2020-10-13 MED ORDER — SODIUM CHLORIDE 0.9 % IV SOLN
Freq: Once | INTRAVENOUS | Status: AC
  Filled 2020-10-13: qty 10

## 2020-10-13 MED ORDER — SODIUM CHLORIDE 0.9 % IV SOLN
40.0000 mg/m2 | Freq: Once | INTRAVENOUS | Status: AC
  Administered 2020-10-13: 80 mg via INTRAVENOUS
  Filled 2020-10-13: qty 80

## 2020-10-13 NOTE — Progress Notes (Signed)
Pt tolerated tx well, vss, d/ced home

## 2020-10-13 NOTE — Progress Notes (Signed)
Patient here today for follow up. Today he reports no pain. He does complain of his eyes being very itchy and crusty. He states this has been going on for about a week. States tries to keep them wiped and clean but when he lays down he awakes to them being itchy and a crust in them. Denies other concerns at this time.

## 2020-10-14 ENCOUNTER — Ambulatory Visit
Admission: RE | Admit: 2020-10-14 | Discharge: 2020-10-14 | Disposition: A | Discharge status: Home / Self Care | Payer: Medicare Other | Source: Ambulatory Visit | Attending: Radiation Oncology | Admitting: Radiation Oncology

## 2020-10-14 ENCOUNTER — Ambulatory Visit: Payer: Medicare Other

## 2020-10-14 DIAGNOSIS — C119 Malignant neoplasm of nasopharynx, unspecified: Secondary | ICD-10-CM | POA: Diagnosis not present

## 2020-10-15 ENCOUNTER — Ambulatory Visit: Payer: Medicare Other

## 2020-10-15 ENCOUNTER — Ambulatory Visit
Admission: RE | Admit: 2020-10-15 | Discharge: 2020-10-15 | Disposition: A | Discharge status: Home / Self Care | Payer: Medicare Other | Source: Ambulatory Visit | Attending: Radiation Oncology | Admitting: Radiation Oncology

## 2020-10-15 DIAGNOSIS — C119 Malignant neoplasm of nasopharynx, unspecified: Secondary | ICD-10-CM | POA: Diagnosis not present

## 2020-10-16 ENCOUNTER — Ambulatory Visit: Payer: Medicare Other

## 2020-10-16 ENCOUNTER — Ambulatory Visit
Admission: RE | Admit: 2020-10-16 | Discharge: 2020-10-16 | Disposition: A | Discharge status: Home / Self Care | Payer: Medicare Other | Source: Ambulatory Visit | Attending: Radiation Oncology | Admitting: Radiation Oncology

## 2020-10-16 DIAGNOSIS — C119 Malignant neoplasm of nasopharynx, unspecified: Secondary | ICD-10-CM | POA: Diagnosis not present

## 2020-10-19 ENCOUNTER — Ambulatory Visit
Admission: RE | Admit: 2020-10-19 | Discharge: 2020-10-19 | Disposition: A | Discharge status: Home / Self Care | Payer: Medicare Other | Source: Ambulatory Visit | Attending: Radiation Oncology | Admitting: Radiation Oncology

## 2020-10-19 ENCOUNTER — Ambulatory Visit: Payer: Medicare Other

## 2020-10-19 ENCOUNTER — Inpatient Hospital Stay: Payer: Medicare Other

## 2020-10-19 ENCOUNTER — Other Ambulatory Visit: Payer: Self-pay | Admitting: *Deleted

## 2020-10-19 DIAGNOSIS — C119 Malignant neoplasm of nasopharynx, unspecified: Secondary | ICD-10-CM | POA: Diagnosis not present

## 2020-10-19 MED ORDER — SUCRALFATE 1 G PO TABS: 1.0000 g | ORAL_TABLET | Freq: Three times a day (TID) | ORAL | 3 refills | Status: DC

## 2020-10-19 NOTE — Progress Notes (Signed)
Nutrition Follow-up:  Patient with stage III nasopharyngeal carcinoma.  Patient receiving concurrent chemotherapy and radiation therapy.    Spoke with patient by phone.  Reports that he tried vinegar and different flavors but still can't taste anything.  Has not eaten anything today.  Had been drinking shakes for diabetics, usually 3 a day sometimes 4.  "I can taste the chocolate flavor."   Reports that Dr. Baruch Gouty prescribed him a rinse today.     Medications: reviewed  Labs: reviewed  Anthropometrics:   Weight 165 lb today decreased from 170 lb on 11/2  170-175 lb per chart weight   NUTRITION DIAGNOSIS: Inadequate oral intake continues   INTERVENTION:  Encouraged patient to switch to higher calorie shake (also higher in carbohydrate and calories) due to patient not eating solid foods. Would recommend controlling blood glucose with medications.  Low carb shakes are lower in calories which can contribute to continued weight loss.  Encouraged 4-5 per day as not eating anything or very little by mouth. Encouraged patient to "nibble" during the day q 2 hours.  Keep trying to eat to get the calories and protein.      MONITORING, EVALUATION, GOAL: weight trends, intake   NEXT VISIT: Dec 13 phone f/u  Tannor Pyon B. Zenia Resides, [place], [place] Registered Dietitian [phone] (mobile)

## 2020-10-19 NOTE — Progress Notes (Signed)
[facility]  [address], [address] [place], [place] [address] Phone: [phone]  Fax: [phone]   Clinic Day:  10/20/2020  Referring physician: Verita Lamb, NP  Chief Complaint: Kenneth Franklin is a 74 y.o. male with stage III nasopharyngeal carcinoma who is seen for assessment prior to week #6 cisplatin and concurrent radiation.  HPI: The patient was last seen in the oncology clinic on 10/13/2020. At that time, he denied any nausea.  He was not eating well secondary to lack of taste. Hematocrit was 35.1, hemoglobin 11.7, MCV 91.9, platelets 167,000, WBC 3,500 (ANC 2,700). Sodium was 132. Glucose was 180. Magnesium was 1.7. He received week #5 cisplatin.  The patient began IMRT treatment on 09/08/2020.  His last treatment is scheduled for 10/28/2020.  During the interim, he has been "alright".  He has been having a lot of mouth pain and burning on the right side. He cannot taste anything. Dr. Baruch Gouty prescribed him carafate but it is not helping. He drinks 4-5 Boosts or Ensures and 5 bottles of water per day. He would like more Norco; he takes half of a pill.  He denies any problems with his hearing. His shortness of breath is stable. He has rare headaches.   Past Medical History:  Diagnosis Date  . Acute ischemic stroke ([place]) 2017  . Alcohol abuse   . Anxiety   . Cancer ([place])   . Depression   . Diabetes mellitus without complication ([place])   . Dyspnea    pcp knows and ordered rescue inhaler  . Hyperlipidemia   . Hypertension   . Skin cancer    Squamous Cell Carcinoma In Situ    Past Surgical History:  Procedure Laterality Date  . CATARACT EXTRACTION Bilateral   . COLONOSCOPY    . NASOPHARYNGOSCOPY N/A 08/12/2020   Procedure: ENDOSCOPIC NASOPHARYNGOSCOPY WITH BIOPSY;  Surgeon: Margaretha Sheffield, MD;  Location: [facility] OR[facility];  Service: ENT;  Laterality: N/A;  . PORTA CATH INSERTION N/A 08/24/2020   Procedure: PORTA CATH INSERTION;  Surgeon: Algernon Huxley, MD;  Location: [facility];  Service: Cardiovascular;  Laterality: N/A;    Family History  Problem Relation Age of Onset  . Diabetes Brother     Social History:  reports that he quit smoking about 14 months ago. His smoking use included cigars and cigarettes. He has a 40.00 pack-year smoking history. He has never used smokeless tobacco. He reports previous alcohol use. He reports that he does not use drugs. He smoked 1 pack/day x 40 years.  He denies exposure of radiation or toxins; he did work with asphalt. He lives alone in [place]. He is widowed. The patient is alone today.  Allergies:  Allergies  Allergen Reactions  . Propoxyphene     Unknown reaction    Current Medications: Current Outpatient Medications  Medication Sig Dispense Refill  . ALPRAZolam (XANAX) 0.25 MG tablet TAKE 1 TABLET EVERY DAY AS NEEDED FOR ANXIETY (Patient taking differently: Take 0.25 mg by mouth daily as needed for anxiety. ) 30 tablet 2  . aspirin EC 81 MG tablet Take 81 mg by mouth daily. Swallow whole.    Marland Kitchen atorvastatin (LIPITOR) 80 MG tablet TAKE 1 TABLET (80 MG TOTAL) BY MOUTH NIGHTLY. (Patient taking differently: Take 80 mg by mouth every evening. ) 90 tablet 0  . busPIRone (BUSPAR) 10 MG tablet TAKE 1 TABLET (10 MG TOTAL) BY MOUTH 2 (TWO) TIMES DAILY. 180 tablet 0  . clopidogrel (PLAVIX)  75 MG tablet TAKE 1 TABLET (75 MG TOTAL) BY MOUTH DAILY. 90 tablet 0  . DENTA 5000 PLUS 1.1 % CREA dental cream Place 1 application onto teeth at bedtime.     Marland Kitchen dexamethasone (DECADRON) 4 MG tablet Take 2 tablets (8 mg total) by mouth daily. Take daily x 3 days starting the day after cisplatin chemotherapy. Take with food. 30 tablet 1  . escitalopram (LEXAPRO) 20 MG tablet Take 20 mg by mouth every morning.     . fluticasone (FLOVENT HFA) 110 MCG/ACT inhaler Inhale 2 puffs into the lungs daily as needed (shortness of breath).    Marland Kitchen glipiZIDE (GLUCOTROL) 5 MG tablet Take 5 mg by mouth daily before  breakfast.     . HYDROcodone-acetaminophen (NORCO) 10-325 MG tablet Take 0.5 tablets by mouth every 4 (four) hours as needed for severe pain. 30 tablet 0  . lidocaine-prilocaine (EMLA) cream Apply 1 application topically as needed. Apply small amount to port site at least 1 hour prior to it being accessed, cover with plastic wrap 30 g 1  . losartan (COZAAR) 100 MG tablet TAKE 1 TABLET (100 MG TOTAL) BY MOUTH DAILY. (Patient taking differently: Take 100 mg by mouth every morning. ) 90 tablet 0  . magnesium oxide (MAG-OX) 400 (241.3 Mg) MG tablet Take 1 tablet (400 mg total) by mouth daily. 30 tablet 0  . metFORMIN (GLUCOPHAGE) 500 MG tablet Take 1 tablet (500 mg total) by mouth daily with breakfast. 90 tablet 0  . mirtazapine (REMERON) 30 MG tablet Take 1 tablet (30 mg total) by mouth at bedtime. (Patient taking differently: Take 15 mg by mouth at bedtime. ) 30 tablet 0  . nystatin cream (MYCOSTATIN) Apply 1 application topically 2 (two) times daily as needed (yeast).     . ondansetron (ZOFRAN) 8 MG tablet Take 1 tablet (8 mg total) by mouth 2 (two) times daily as needed. Start on the third day after cisplatin chemotherapy. 30 tablet 1  . pantoprazole (PROTONIX) 20 MG tablet Take 20 mg by mouth daily before lunch.     . prochlorperazine (COMPAZINE) 10 MG tablet Take 1 tablet (10 mg total) by mouth every 6 (six) hours as needed (Nausea or vomiting). 30 tablet 1  . sitaGLIPtin (JANUVIA) 100 MG tablet Take 100 mg by mouth every morning.     . sucralfate (CARAFATE) 1 g tablet Take 1 tablet (1 g total) by mouth 3 (three) times daily. Dissolve in 3-4 tbsp warm water, swish and swallow. 90 tablet 3  . VITAMIN D PO Take 1 capsule by mouth daily.    Marland Kitchen escitalopram (LEXAPRO) 10 MG tablet Take 1 tablet (10 mg total) by mouth daily. Stop your lexapro in a week 6 tablet 0  . magnesium oxide (MAG-OX) 400 MG tablet Take 1 tablet by mouth daily. (Patient not taking: Reported on 10/20/2020)    . sucralfate (CARAFATE)  1 g tablet Take 1 tablet (1 g total) by mouth 4 (four) times daily. 120 tablet 11   No current facility-administered medications for this visit.    Review of Systems  Constitutional: Positive for weight loss (2 lbs). Negative for chills, diaphoresis, fever and malaise/fatigue.       Feels "alright."  HENT: Negative for congestion, ear discharge, ear pain, hearing loss, nosebleeds, sinus pain, sore throat and tinnitus.        No sense of taste. Pain and burning on right side of mouth. No problems with hearing.  Eyes: Negative for blurred vision.  Respiratory: Positive for shortness of breath (on exertion, stable). Negative for cough, hemoptysis and sputum production.   Cardiovascular: Negative.  Negative for chest pain, palpitations and leg swelling.  Gastrointestinal: Negative for abdominal pain, blood in stool, constipation, diarrhea, heartburn, melena, nausea and vomiting.       Drinks Boost/Ensure 4-5 x per day. Drinks 5 bottles of water per day.  Genitourinary: Negative.  Negative for dysuria, frequency, hematuria and urgency.       Urinates regularly throughout the day.  Musculoskeletal: Negative.  Negative for back pain, joint pain, myalgias and neck pain.  Skin: Negative for itching and rash.  Neurological: Positive for headaches (rare). Negative for dizziness, tingling, sensory change and weakness.  Endo/Heme/Allergies: Negative.  Does not bruise/bleed easily.  Psychiatric/Behavioral: Negative.  Negative for depression and memory loss. The patient is not nervous/anxious and does not have insomnia.   All other systems reviewed and are negative.  Performance status (ECOG): 1  Vitals Blood pressure 119/74, pulse 92, temperature 97.7 F (36.5 C), temperature source Tympanic, resp. rate 18, weight 168 lb (76.2 kg), SpO2 100 %.   Physical Exam Vitals and nursing note reviewed.  Constitutional:      General: He is not in acute distress.    Appearance: Normal appearance. He is  well-developed.     Interventions: Face mask in place.     Comments: Gentleman sitting comfortably in wheelchair in no acute distress. Able to get onto table for exam.  HENT:     Head: Normocephalic and atraumatic.     Comments: Cap. Gray hair.    Right Ear: Hearing normal.     Left Ear: Hearing normal.     Mouth/Throat:     Mouth: Mucous membranes are dry. No oral lesions.     Comments: Pinkness on hard palate (improved). Eyes:     General: No scleral icterus.    Extraocular Movements: Extraocular movements intact.     Conjunctiva/sclera: Conjunctivae normal.     Pupils: Pupils are equal, round, and reactive to light.     Comments: Eye irritation has resolved.  Cardiovascular:     Rate and Rhythm: Normal rate and regular rhythm.     Heart sounds: Normal heart sounds. No murmur heard.  No friction rub. No gallop.   Pulmonary:     Effort: Pulmonary effort is normal.     Breath sounds: Normal breath sounds. No wheezing, rhonchi or rales.  Abdominal:     General: Bowel sounds are normal. There is no distension.     Palpations: Abdomen is soft. There is no hepatomegaly, splenomegaly or mass.     Tenderness: There is no abdominal tenderness. There is no guarding or rebound.  Musculoskeletal:        General: No tenderness. Normal range of motion.     Cervical back: Normal range of motion and neck supple.  Lymphadenopathy:     Head:     Right side of head: No preauricular, posterior auricular or occipital adenopathy.     Left side of head: No preauricular, posterior auricular or occipital adenopathy.     Cervical: No cervical adenopathy.     Upper Body:     Right upper body: No supraclavicular or axillary adenopathy.     Left upper body: No supraclavicular or axillary adenopathy.     Lower Body: No right inguinal adenopathy. No left inguinal adenopathy.  Skin:    General: Skin is warm and dry.     Findings: No bruising, erythema, lesion  or rash.     Comments: Ruddy erythema on  neck s/p radiation.  Neurological:     Mental Status: He is alert and oriented to person, place, and time.  Psychiatric:        Behavior: Behavior normal.        Thought Content: Thought content normal.        Judgment: Judgment normal.    No visits with results within 3 Day(s) from this visit.  Latest known visit with results is:  Admission on 08/12/2020, Discharged on 08/12/2020  Component Date Value Ref Range Status  . Sodium 08/12/2020 132* 135 - 145 mmol/L Final  . Potassium 08/12/2020 4.2  3.5 - 5.1 mmol/L Final  . Chloride 08/12/2020 96* 98 - 111 mmol/L Final  . CO2 08/12/2020 27  22 - 32 mmol/L Final  . Glucose, Bld 08/12/2020 374* 70 - 99 mg/dL Final   Glucose reference range applies only to samples taken after fasting for at least 8 hours.  . BUN 08/12/2020 7* 8 - 23 mg/dL Final  . Creatinine, Ser 08/12/2020 0.84  0.61 - 1.24 mg/dL Final  . Calcium 08/12/2020 8.5* 8.9 - 10.3 mg/dL Final  . GFR calc non Af Amer 08/12/2020 >60  >60 mL/min Final  . GFR calc Af Amer 08/12/2020 >60  >60 mL/min Final  . Anion gap 08/12/2020 9  5 - 15 Final   Performed at [facility], [address] [place], [place] [address]  . WBC 08/12/2020 5.5  4.0 - 10.5 K/uL Final  . RBC 08/12/2020 4.46  4.22 - 5.81 MIL/uL Final  . Hemoglobin 08/12/2020 13.2  13.0 - 17.0 g/dL Final  . HCT 08/12/2020 39.3  39 - 52 % Final  . MCV 08/12/2020 88.1  80.0 - 100.0 fL Final  . MCH 08/12/2020 29.6  26.0 - 34.0 pg Final  . MCHC 08/12/2020 33.6  30.0 - 36.0 g/dL Final  . RDW 08/12/2020 12.6  11.5 - 15.5 % Final  . Platelets 08/12/2020 178  150 - 400 K/uL Final  . nRBC 08/12/2020 0.0  0.0 - 0.2 % Final   Performed at [facility], [address] [place], [place] [address]  . Glucose-Capillary 08/12/2020 368* 70 - 99 mg/dL Final   Glucose reference range applies only to samples taken after fasting for at least 8 hours.  . Glucose-Capillary 08/12/2020 333* 70 - 99 mg/dL Final   Glucose  reference range applies only to samples taken after fasting for at least 8 hours.  . SURGICAL PATHOLOGY 08/12/2020    Final-Edited                   Value:SURGICAL PATHOLOGY CASE: [phone] PATIENT: Kenneth Franklin Surgical Pathology Report  Specimen Submitted: A. Nasopharyngeal tumor  Clinical History: Malignant neoplasm nasopharynx, sphenoid sinus.  DIAGNOSIS: A. NASOPHARYNGEAL TUMOR; BIOPSY: - KERATINIZING SQUAMOUS CELL CARCINOMA, MODERATELY DIFFERENTIATED.  GROSS DESCRIPTION: Intraoperative Consultation:     Labeled: Nasopharyngeal tumor     Received: Fresh     Specimen: Nasopharyngoscopy with biopsy     Pathologic evaluation performed: Frozen section diagnosis     Diagnosis: FSA, representative section: Squamous cell carcinoma     Communicated to: Called to Dr. Kathyrn Sheriff at 11:36 AM on 08/12/2020 Quay Burow M.D.     Tissue submitted: A touch preparation with 1 Diff-Quik stained slide is performed.  Additionally, representative sections of the specimen are frozen as FSA1 and 3 frozen section slides are performed.  A. Labeled: Nasopharyngeal tumor Received:  Fresh Tissue fragment(s): Multiple                          Size: Aggregate, 1.4 x 1.4 x 0.4 cm Description: Received fresh on a Telfa pad are fragments of pink soft tissue and blood clot.  A touch preparation with 1 Diff-Quik stained slide is performed.  Representative sections are frozen as FSA1 with 3 frozen section slides performed.  The frozen section remnant is submitted in cassette 1 and the remainder of the specimen is submitted in cassette 2.  Final Diagnosis performed by Quay Burow, MD.   Electronically signed 08/13/2020 9:25:55AM The electronic signature indicates that the named Attending Pathologist has evaluated the specimen Technical component performed at [facility], [address], [place], [place] [address] Lab: [phone] Dir: Rush Farmer, MD, MMM  Professional component performed at [facility],  [facility], [place], [place], [place] [address] Lab: [phone] Dir: Dellia Nims. Rubinas, MD  . Glucose-Capillary 08/12/2020 275* 70 - 99 mg/dL Final   Glucose reference range applies only to samples taken after fasting for at least 8 hours.  . Glucose-Capillary 08/12/2020 246* 70 - 99 mg/dL Final   Glucose reference range applies only to samples taken after fasting for at least 8 hours.    Assessment:  Kenneth Franklin is a 74 y.o. 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  Pathology revealed keratinizing squamous cell carcinoma, moderately differentiated.   Head MRI on 08/13/2020 revealed a 5.9 cm nasopharyngeal mass consistent with known nasopharyngeal carcinoma with involvement of the clivus and sphenoid sinus. There was moderate chronic small vessel ischemic disease and cerebral atrophy.  There was no intracranial extension.  PET scan on 08/18/2020 revealed a 5.7 x 4.2 cm destructive hypermetabolic mass (SUV 74.16) involving the sphenoid sinus, skull base and posterior nasopharynx.  This was consistent with patient's known squamous cell carcinoma. There was no locoregional lymphadenopathy or distant metastatic disease.  He began IMRT on 09/08/2020.  He is s/p 4 weeks of cisplatin (09/08/2020 - 09/29/2020; 10/13/2020).  He did not receive treatment on 10/06/2020 secondary to poor urine output.  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  Symptomatically, he has been having a lot of mouth pain and burning on the right side. He cannot taste anything. He drinks 4-5 Boosts or Ensures and 5 bottles of water per day. He denies any problems with his hearing. Shortness of breath is stable. He has rare headaches.  Calcium is 10.2.  Plan: 1.   Labs today: CBC with diff, CMP, Mg. 2.   Clinical [id number] nasopharyngeal carcinoma  Tumor  is unresectable.  PET scan revealed no evidence of distant disease  Treatment includes upfront radiation and chemotherapy followed by adjuvant chemotherapy   Cisplatin 40 mg/m2 weekly x 6-7 with radiation followed by cisplatin 80 mg/m2 D1 + 5FU 1000 mg/m2/day CI D1-4 q 28 days x 3 cycles  Baseline audiogram confirmed hearing loss associated with prior exposure.  Symptomatically, he has mucositis secondary to radiation.   He denies any nausea.  He is voiding well.  He is s/p 5 weeks of cisplatin with radiation.   Radiation ends on 10/28/2020  Labs reviewed.  Begin week #6 cisplatin and radiation  Discuss symptom management.  He has antiemetics and pain medications at home to use on a prn bases.  Interventions are adequate.  3.   Mucositis  Exam reveals mucositis involving hard palate.  Pain controlled with hydrocodone with Tylenol.   Refill hydrocodone-acetaminophen (Norco) 10-325 mg 1/2 tablet po q 4 hours as needed for pain.  Patient notes no relief with Carafate.  Rx: Magic mouthwash. 4.   Hypomagnesemia  Magnesium 1.4.  Magnesium 2 gm IV. 5.   Week #6 cisplatin today. 6.   Assess calcium content in Ensure/Boost. 7.   RTC in 1 week for MD assessment, labs (CBC with diff, CMP, Mg), and week #7 cisplatin.  I discussed the assessment and treatment plan with the patient.  The patient was provided an opportunity to ask questions and all were answered.  The patient agreed with the plan and demonstrated an understanding of the instructions.  The patient was advised to call back if the symptoms worsen or if the condition fails to improve as anticipated.   Dolphus Linch C. Mike Gip, MD, PhD    10/20/2020, 9:31 AM  I, Mirian Mo Tufford, am acting as Education administrator for [employer]. Mike Gip, MD, PhD.  I, Arnita Koons C. Mike Gip, MD, have reviewed the above documentation for accuracy and completeness, and I agree with the above.

## 2020-10-20 ENCOUNTER — Ambulatory Visit
Admission: RE | Admit: 2020-10-20 | Discharge: 2020-10-20 | Disposition: A | Discharge status: Home / Self Care | Payer: Medicare Other | Source: Ambulatory Visit | Attending: Radiation Oncology | Admitting: Radiation Oncology

## 2020-10-20 ENCOUNTER — Ambulatory Visit: Payer: Medicare Other

## 2020-10-20 ENCOUNTER — Other Ambulatory Visit: Payer: Self-pay

## 2020-10-20 ENCOUNTER — Encounter: Payer: Self-pay | Admitting: Hematology and Oncology

## 2020-10-20 ENCOUNTER — Inpatient Hospital Stay: Payer: Medicare Other

## 2020-10-20 ENCOUNTER — Inpatient Hospital Stay (HOSPITAL_BASED_OUTPATIENT_CLINIC_OR_DEPARTMENT_OTHER): Payer: Medicare Other | Admitting: Hematology and Oncology

## 2020-10-20 VITALS — BP 119/74 | HR 92 | Temp 97.7°F | Resp 18 | Wt 168.0 lb

## 2020-10-20 DIAGNOSIS — H9190 Unspecified hearing loss, unspecified ear: Secondary | ICD-10-CM

## 2020-10-20 DIAGNOSIS — C119 Malignant neoplasm of nasopharynx, unspecified: Secondary | ICD-10-CM

## 2020-10-20 DIAGNOSIS — K1233 Oral mucositis (ulcerative) due to radiation: Secondary | ICD-10-CM

## 2020-10-20 DIAGNOSIS — E876 Hypokalemia: Secondary | ICD-10-CM

## 2020-10-20 DIAGNOSIS — Z5111 Encounter for antineoplastic chemotherapy: Secondary | ICD-10-CM | POA: Diagnosis not present

## 2020-10-20 LAB — CBC WITH DIFFERENTIAL/PLATELET
Abs Immature Granulocytes: 0.05 10*3/uL (ref 0.00–0.07)
Basophils Absolute: 0 10*3/uL (ref 0.0–0.1)
Basophils Relative: 1 %
Eosinophils Absolute: 0 10*3/uL (ref 0.0–0.5)
Eosinophils Relative: 1 %
HCT: 34.8 % — ABNORMAL LOW (ref 39.0–52.0)
Hemoglobin: 11.5 g/dL — ABNORMAL LOW (ref 13.0–17.0)
Immature Granulocytes: 1 %
Lymphocytes Relative: 9 %
Lymphs Abs: 0.5 10*3/uL — ABNORMAL LOW (ref 0.7–4.0)
MCH: 30.2 pg (ref 26.0–34.0)
MCHC: 33 g/dL (ref 30.0–36.0)
MCV: 91.3 fL (ref 80.0–100.0)
Monocytes Absolute: 0.6 10*3/uL (ref 0.1–1.0)
Monocytes Relative: 11 %
Neutro Abs: 4.1 10*3/uL (ref 1.7–7.7)
Neutrophils Relative %: 77 %
Platelets: 215 10*3/uL (ref 150–400)
RBC: 3.81 MIL/uL — ABNORMAL LOW (ref 4.22–5.81)
RDW: 14.7 % (ref 11.5–15.5)
WBC: 5.2 10*3/uL (ref 4.0–10.5)
nRBC: 0 % (ref 0.0–0.2)

## 2020-10-20 LAB — COMPREHENSIVE METABOLIC PANEL
ALT: 28 U/L (ref 0–44)
AST: 26 U/L (ref 15–41)
Albumin: 4 g/dL (ref 3.5–5.0)
Alkaline Phosphatase: 89 U/L (ref 38–126)
Anion gap: 9 (ref 5–15)
BUN: 23 mg/dL (ref 8–23)
CO2: 28 mmol/L (ref 22–32)
Calcium: 10.2 mg/dL (ref 8.9–10.3)
Chloride: 95 mmol/L — ABNORMAL LOW (ref 98–111)
Creatinine, Ser: 0.8 mg/dL (ref 0.61–1.24)
GFR, Estimated: >60 mL/min (ref >60)
Glucose, Bld: 134 mg/dL — ABNORMAL HIGH (ref 70–99)
Potassium: 4.7 mmol/L (ref 3.5–5.1)
Sodium: 132 mmol/L — ABNORMAL LOW (ref 135–145)
Total Bilirubin: 0.7 mg/dL (ref 0.3–1.2)
Total Protein: 7.4 g/dL (ref 6.5–8.1)

## 2020-10-20 LAB — MAGNESIUM: Magnesium: 1.4 mg/dL — ABNORMAL LOW (ref 1.7–2.4)

## 2020-10-20 MED ORDER — SODIUM CHLORIDE 0.9 % IV SOLN
Freq: Once | INTRAVENOUS | Status: AC
  Filled 2020-10-20: qty 10

## 2020-10-20 MED ORDER — PALONOSETRON HCL INJECTION 0.25 MG/5ML
0.2500 mg | Freq: Once | INTRAVENOUS | Status: AC
  Administered 2020-10-20: 0.25 mg via INTRAVENOUS
  Filled 2020-10-20: qty 5

## 2020-10-20 MED ORDER — HEPARIN SOD (PORK) LOCK FLUSH 100 UNIT/ML IV SOLN
500.0000 [IU] | Freq: Once | INTRAVENOUS | Status: AC | PRN
  Administered 2020-10-20: 500 [IU]
  Filled 2020-10-20: qty 5

## 2020-10-20 MED ORDER — SODIUM CHLORIDE 0.9 % IV SOLN
Freq: Once | INTRAVENOUS | Status: AC
  Filled 2020-10-20: qty 250

## 2020-10-20 MED ORDER — MAGIC MOUTHWASH W/LIDOCAINE: 5.0000 mL | Freq: Four times a day (QID) | ORAL | 1 refills | Status: DC | PRN

## 2020-10-20 MED ORDER — SODIUM CHLORIDE 0.9 % IV SOLN
40.0000 mg/m2 | Freq: Once | INTRAVENOUS | Status: AC
  Administered 2020-10-20: 80 mg via INTRAVENOUS
  Filled 2020-10-20: qty 80

## 2020-10-20 MED ORDER — HYDROCODONE-ACETAMINOPHEN 10-325 MG PO TABS: 0.5000 | ORAL_TABLET | ORAL | 0 refills | Status: DC | PRN

## 2020-10-20 MED ORDER — SODIUM CHLORIDE 0.9 % IV SOLN
150.0000 mg | Freq: Once | INTRAVENOUS | Status: AC
  Administered 2020-10-20: 150 mg via INTRAVENOUS
  Filled 2020-10-20: qty 5

## 2020-10-20 MED ORDER — SODIUM CHLORIDE 0.9 % IV SOLN
10.0000 mg | Freq: Once | INTRAVENOUS | Status: AC
  Administered 2020-10-20: 10 mg via INTRAVENOUS
  Filled 2020-10-20: qty 1

## 2020-10-20 NOTE — Progress Notes (Signed)
Sucralfate was prescribed by Dr Baruch Gouty for mouth and throat pain but it is not helping. Patient reports pain 7/10, has gotten worse. Unable to eat, is drinking boost. Has some constipation.

## 2020-10-20 NOTE — Progress Notes (Signed)
Pt received prescribed treatment in clinic, pt stable at d/c. 

## 2020-10-21 ENCOUNTER — Telehealth: Payer: Self-pay | Admitting: *Deleted

## 2020-10-21 ENCOUNTER — Other Ambulatory Visit: Payer: Self-pay | Admitting: Hematology and Oncology

## 2020-10-21 ENCOUNTER — Ambulatory Visit
Admission: RE | Admit: 2020-10-21 | Discharge: 2020-10-21 | Disposition: A | Discharge status: Home / Self Care | Payer: Medicare Other | Source: Ambulatory Visit | Attending: Radiation Oncology | Admitting: Radiation Oncology

## 2020-10-21 ENCOUNTER — Ambulatory Visit: Payer: Medicare Other

## 2020-10-21 DIAGNOSIS — C119 Malignant neoplasm of nasopharynx, unspecified: Secondary | ICD-10-CM | POA: Diagnosis not present

## 2020-10-21 NOTE — Telephone Encounter (Signed)
Daughter Otila Kluver called reporting that patient is upset that he will have to get a 7th chemotherapy treatment and she is asking for a return call to discuss reasoning for this so she can explain it to him and calm him down. [phone]

## 2020-10-22 DIAGNOSIS — R110 Nausea: Secondary | ICD-10-CM | POA: Insufficient documentation

## 2020-10-22 DIAGNOSIS — R630 Anorexia: Secondary | ICD-10-CM | POA: Insufficient documentation

## 2020-10-22 DIAGNOSIS — E860 Dehydration: Secondary | ICD-10-CM | POA: Insufficient documentation

## 2020-10-23 ENCOUNTER — Other Ambulatory Visit: Payer: Self-pay

## 2020-10-23 ENCOUNTER — Emergency Department: Payer: Medicare Other

## 2020-10-23 ENCOUNTER — Emergency Department
Admission: EM | Admit: 2020-10-23 | Discharge: 2020-10-23 | Disposition: A | Discharge status: Home / Self Care | Payer: Medicare Other | Source: Home / Self Care | Attending: Emergency Medicine | Admitting: Emergency Medicine

## 2020-10-23 DIAGNOSIS — K0889 Other specified disorders of teeth and supporting structures: Secondary | ICD-10-CM | POA: Insufficient documentation

## 2020-10-23 DIAGNOSIS — Z85828 Personal history of other malignant neoplasm of skin: Secondary | ICD-10-CM | POA: Insufficient documentation

## 2020-10-23 DIAGNOSIS — K1231 Oral mucositis (ulcerative) due to antineoplastic therapy: Secondary | ICD-10-CM | POA: Diagnosis not present

## 2020-10-23 DIAGNOSIS — Z7984 Long term (current) use of oral hypoglycemic drugs: Secondary | ICD-10-CM | POA: Insufficient documentation

## 2020-10-23 DIAGNOSIS — Z87891 Personal history of nicotine dependence: Secondary | ICD-10-CM | POA: Insufficient documentation

## 2020-10-23 DIAGNOSIS — E119 Type 2 diabetes mellitus without complications: Secondary | ICD-10-CM | POA: Insufficient documentation

## 2020-10-23 DIAGNOSIS — K1379 Other lesions of oral mucosa: Secondary | ICD-10-CM

## 2020-10-23 DIAGNOSIS — Z79899 Other long term (current) drug therapy: Secondary | ICD-10-CM | POA: Insufficient documentation

## 2020-10-23 DIAGNOSIS — Z7982 Long term (current) use of aspirin: Secondary | ICD-10-CM | POA: Insufficient documentation

## 2020-10-23 DIAGNOSIS — I10 Essential (primary) hypertension: Secondary | ICD-10-CM | POA: Insufficient documentation

## 2020-10-23 DIAGNOSIS — Z7901 Long term (current) use of anticoagulants: Secondary | ICD-10-CM | POA: Insufficient documentation

## 2020-10-23 LAB — LIPASE, BLOOD: Lipase: 16 U/L (ref 11–51)

## 2020-10-23 LAB — COMPREHENSIVE METABOLIC PANEL
ALT: 24 U/L (ref 0–44)
AST: 24 U/L (ref 15–41)
Albumin: 4 g/dL (ref 3.5–5.0)
Alkaline Phosphatase: 85 U/L (ref 38–126)
Anion gap: 12 (ref 5–15)
BUN: 25 mg/dL — ABNORMAL HIGH (ref 8–23)
CO2: 25 mmol/L (ref 22–32)
Calcium: 9.5 mg/dL (ref 8.9–10.3)
Chloride: 96 mmol/L — ABNORMAL LOW (ref 98–111)
Creatinine, Ser: 0.93 mg/dL (ref 0.61–1.24)
GFR, Estimated: >60 mL/min (ref >60)
Glucose, Bld: 164 mg/dL — ABNORMAL HIGH (ref 70–99)
Potassium: 4.2 mmol/L (ref 3.5–5.1)
Sodium: 133 mmol/L — ABNORMAL LOW (ref 135–145)
Total Bilirubin: 1 mg/dL (ref 0.3–1.2)
Total Protein: 7.3 g/dL (ref 6.5–8.1)

## 2020-10-23 LAB — CBC
HCT: 34 % — ABNORMAL LOW (ref 39.0–52.0)
Hemoglobin: 11.6 g/dL — ABNORMAL LOW (ref 13.0–17.0)
MCH: 31.3 pg (ref 26.0–34.0)
MCHC: 34.1 g/dL (ref 30.0–36.0)
MCV: 91.6 fL (ref 80.0–100.0)
Platelets: 221 10*3/uL (ref 150–400)
RBC: 3.71 MIL/uL — ABNORMAL LOW (ref 4.22–5.81)
RDW: 15.2 % (ref 11.5–15.5)
WBC: 6.1 10*3/uL (ref 4.0–10.5)
nRBC: 0 % (ref 0.0–0.2)

## 2020-10-23 LAB — PHOSPHORUS: Phosphorus: 2.7 mg/dL (ref 2.5–4.6)

## 2020-10-23 LAB — MAGNESIUM: Magnesium: 1.5 mg/dL — ABNORMAL LOW (ref 1.7–2.4)

## 2020-10-23 MED ORDER — LIDOCAINE VISCOUS HCL 2 % MT SOLN: 5.0000 mL | Freq: Four times a day (QID) | OROMUCOSAL | 0 refills | Status: DC | PRN

## 2020-10-23 MED ORDER — SODIUM CHLORIDE 0.9 % IV BOLUS
1000.0000 mL | Freq: Once | INTRAVENOUS | Status: AC
  Administered 2020-10-23: 1000 mL via INTRAVENOUS

## 2020-10-23 MED ORDER — LIDOCAINE VISCOUS HCL 2 % MT SOLN
15.0000 mL | Freq: Once | OROMUCOSAL | Status: AC
  Administered 2020-10-23: 15 mL via OROMUCOSAL
  Filled 2020-10-23: qty 15

## 2020-10-23 MED ORDER — MAGNESIUM SULFATE 2 GM/50ML IV SOLN
2.0000 g | Freq: Once | INTRAVENOUS | Status: AC
  Administered 2020-10-23: 2 g via INTRAVENOUS
  Filled 2020-10-23: qty 50

## 2020-10-23 MED ORDER — FAMOTIDINE IN NACL 20-0.9 MG/50ML-% IV SOLN
20.0000 mg | Freq: Once | INTRAVENOUS | Status: AC
  Administered 2020-10-23: 20 mg via INTRAVENOUS
  Filled 2020-10-23: qty 50

## 2020-10-23 NOTE — ED Notes (Signed)
Lab called and informed to add-on mag and phosphorous to initial blood work.  Lab, Maudie Mercury, Verbalized understanding.

## 2020-10-23 NOTE — ED Notes (Signed)
Pt transported to radiology.

## 2020-10-23 NOTE — ED Triage Notes (Addendum)
Pt comes via POV from home with c/o sore throat and abdominal pain. Pt states he has been receiving chemo and radiation treatments for 15 days and has not been able to eat. Pt states severe pain to throat area. Pt states that is where the chemo and radiation is treating.   Pt states they gave him some medicine for his throat but it is not helping.  Pt appears uncomfortable and pain.

## 2020-10-23 NOTE — ED Notes (Signed)
ED Provider at bedside. 

## 2020-10-23 NOTE — ED Notes (Signed)
Pt returned to room from radiology

## 2020-10-23 NOTE — ED Provider Notes (Signed)
[facility] Emergency Department Provider Note ____________________________________________   First MD Initiated Contact with Patient 10/23/20 1758     (approximate)  I have reviewed the triage vital signs and the nursing notes.   HISTORY  Chief Complaint Sore Throat and Abdominal Pain    HPI Kenneth Franklin is a 74 y.o. 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  He states that this has been occurring since he began the treatments.  He reports pain in the right side of his mouth and throat described as burning.  It is mainly present when he tries to swallow.  He also reports heartburn and cough.  He has hydrocodone for the pain as well as Magic mouthwash with lidocaine and Carafate, but states that these are not helping.  The patient states he is able to drink liquids and handle his secretions.  He is drinking Ensure as well.  However, he has not been able to eat solid foods due to the pain.  Past Medical History:  Diagnosis Date  . Acute ischemic stroke ([place]) 2017  . Alcohol abuse   . Anxiety   . Cancer ([place])   . Depression   . Diabetes mellitus without complication ([place])   . Dyspnea    pcp knows and ordered rescue inhaler  . Hyperlipidemia   . Hypertension   . Skin cancer    Squamous Cell Carcinoma In Situ    Patient Active Problem List   Diagnosis Date Noted  . Dehydration 10/22/2020  . Nausea without vomiting 10/22/2020  . Poor appetite 10/22/2020  . Goals of care, counseling/discussion 09/21/2020  . Hypokalemia 09/15/2020  . 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 Encounter for antineoplastic chemotherapy 09/15/2020  . Hypomagnesemia 09/08/2020  . Nasopharyngeal carcinoma ([place]) 08/18/2020  . Chronic obstructive pulmonary disease with acute exacerbation ([place]) 10/01/2019  . Severe recurrent major  depression without psychotic features ([place]) 06/01/2017  . Alcohol abuse 06/01/2017  . Tylenol overdose 05/31/2017  . Erroneous encounter - disregard 08/02/2016  . Acute ischemic stroke ([place]) 07/07/2016  . Tobacco use disorder 06/28/2016  . Tick bite of flank 06/16/2016  . Grief counseling 06/16/2016  . Fatigue 06/16/2016  . Absent pulse in lower extremity 09/14/2015  . Anxiety and depression 04/21/2015  . Diabetes mellitus type 2, controlled, without complications ([place]) 40/98/1191  . Acid reflux 04/21/2015  . Calcium blood increased 04/21/2015  . Dyslipidemia 04/21/2015  . HLD (hyperlipidemia) 04/21/2015  . BP (high blood pressure) 04/21/2015    Past Surgical History:  Procedure Laterality Date  . CATARACT EXTRACTION Bilateral   . COLONOSCOPY    . NASOPHARYNGOSCOPY N/A 08/12/2020   Procedure: ENDOSCOPIC NASOPHARYNGOSCOPY WITH BIOPSY;  Surgeon: Margaretha Sheffield, MD;  Location: [facility] ORS;  Service: ENT;  Laterality: N/A;  . PORTA CATH INSERTION N/A 08/24/2020   Procedure: PORTA CATH INSERTION;  Surgeon: Algernon Huxley, MD;  Location: [facility];  Service: Cardiovascular;  Laterality: N/A;    Prior to Admission medications   Medication Sig Start Date End Date Taking? Authorizing Provider  ALPRAZolam (XANAX) 0.25 MG tablet TAKE 1 TABLET EVERY DAY AS NEEDED FOR ANXIETY Patient taking differently: Take 0.25 mg by mouth daily as needed for anxiety.  10/26/16   Roselee Nova, MD  aspirin EC 81 MG tablet Take 81 mg by mouth daily. Swallow whole.    [provider]  atorvastatin (LIPITOR) 80 MG tablet TAKE 1 TABLET (80 MG TOTAL) BY MOUTH NIGHTLY. Patient taking differently: Take 80 mg by mouth every evening.  11/14/16   Roselee Nova, MD  busPIRone (BUSPAR) 10 MG tablet TAKE 1 TABLET (10 MG TOTAL) BY MOUTH 2 (TWO) TIMES DAILY. 08/16/16   Roselee Nova, MD  clopidogrel (PLAVIX) 75 MG tablet TAKE 1 TABLET (75 MG TOTAL) BY MOUTH DAILY. 07/25/16   Roselee Nova, MD   DENTA 5000 PLUS 1.1 % CREA dental cream Place 1 application onto teeth at bedtime.  03/18/20   [provider]  dexamethasone (DECADRON) 4 MG tablet Take 2 tablets (8 mg total) by mouth daily. Take daily x 3 days starting the day after cisplatin chemotherapy. Take with food. 09/08/20   Lequita Asal, MD  escitalopram (LEXAPRO) 10 MG tablet Take 1 tablet (10 mg total) by mouth daily. Stop your lexapro in a week 06/02/17 10/06/20  Fritzi Mandes, MD  escitalopram (LEXAPRO) 20 MG tablet Take 20 mg by mouth every morning.     [provider]  fluticasone (FLOVENT HFA) 110 MCG/ACT inhaler Inhale 2 puffs into the lungs daily as needed (shortness of breath).    [provider]  glipiZIDE (GLUCOTROL) 5 MG tablet Take 5 mg by mouth daily before breakfast.     [provider]  HYDROcodone-acetaminophen (NORCO) 10-325 MG tablet Take 0.5 tablets by mouth every 4 (four) hours as needed for severe pain. 10/20/20   Lequita Asal, MD  lidocaine (XYLOCAINE) 2 % solution Use as directed 5 mLs in the mouth or throat every 6 (six) hours as needed for mouth pain. 10/23/20   Arta Silence, MD  lidocaine-prilocaine (EMLA) cream Apply 1 application topically as needed. Apply small amount to port site at least 1 hour prior to it being accessed, cover with plastic wrap 08/24/20   Lequita Asal, MD  losartan (COZAAR) 100 MG tablet TAKE 1 TABLET (100 MG TOTAL) BY MOUTH DAILY. Patient taking differently: Take 100 mg by mouth every morning.  08/16/16   Roselee Nova, MD  magic mouthwash w/lidocaine SOLN Take 5 mLs by mouth 4 (four) times daily as needed for mouth pain. Sig: Swish/Spit 5-10 ml four times a day as needed. Dispense 480 ml. 1RF 10/20/20   Lequita Asal, MD  magnesium oxide (MAG-OX) 400 MG tablet Take 1 tablet by mouth daily. Patient not taking: Reported on 10/20/2020 10/06/20   [provider]  magnesium oxide (MAG-OX) 400 MG tablet TAKE 1 TABLET BY  MOUTH EVERY DAY 10/21/20   Lequita Asal, MD  metFORMIN (GLUCOPHAGE) 500 MG tablet Take 1 tablet (500 mg total) by mouth daily with breakfast. 05/18/16   Roselee Nova, MD  mirtazapine (REMERON) 30 MG tablet Take 1 tablet (30 mg total) by mouth at bedtime. Patient taking differently: Take 15 mg by mouth at bedtime.  06/02/17   Fritzi Mandes, MD  nystatin cream (MYCOSTATIN) Apply 1 application topically 2 (two) times daily as needed (yeast).  05/13/20   [provider]  ondansetron (ZOFRAN) 8 MG tablet Take 1 tablet (8 mg total) by mouth 2 (two) times daily as needed. Start on the third day after cisplatin chemotherapy. 09/08/20   Lequita Asal, MD  pantoprazole (PROTONIX) 20 MG tablet Take 20 mg by mouth daily before lunch.     [provider]  prochlorperazine (COMPAZINE) 10 MG tablet Take 1 tablet (10 mg total) by mouth  every 6 (six) hours as needed (Nausea or vomiting). 09/08/20   Lequita Asal, MD  sitaGLIPtin (JANUVIA) 100 MG tablet Take 100 mg by mouth every morning.     [provider]  sucralfate (CARAFATE) 1 g tablet Take 1 tablet (1 g total) by mouth 4 (four) times daily. 06/10/19 10/13/20  Duffy Bruce, MD  sucralfate (CARAFATE) 1 g tablet Take 1 tablet (1 g total) by mouth 3 (three) times daily. Dissolve in 3-4 tbsp warm water, swish and swallow. 10/19/20   Noreene Filbert, MD  VITAMIN D PO Take 1 capsule by mouth daily.    [provider]    Allergies Propoxyphene  Family History  Problem Relation Age of Onset  . Diabetes Brother     Social History Social History   Tobacco Use  . Smoking status: Former Smoker    Packs/day: 1.00    Years: 40.00    Pack years: 40.00    Types: Cigars, Cigarettes    Quit date: 08/12/2019    Years since quitting: 1.2  . Smokeless tobacco: Never Used  Vaping Use  . Vaping Use: Never used  Substance Use Topics  . Alcohol use: Not Currently    Comment: H/O ETOH ABUSE BUT DENIES DRINKING  DURING 08-11-20 INTERVIEW  . Drug use: No    Review of Systems  Constitutional: No fever. Eyes: No redness. ENT: Positive for sore throat. Cardiovascular: Denies chest pain. Respiratory: Denies shortness of breath. Gastrointestinal: No vomiting. Genitourinary: Negative for dysuria.  Musculoskeletal: Negative for back pain. Skin: Negative for rash. Neurological: Negative for headache.   ____________________________________________   PHYSICAL EXAM:  VITAL SIGNS: ED Triage Vitals  Enc Vitals Group     BP 10/23/20 1628 105/66     Pulse Rate 10/23/20 1628 (!) 109     Resp 10/23/20 1628 18     Temp 10/23/20 1628 98.1 F (36.7 C)     Temp src --      SpO2 10/23/20 1628 97 %     Weight 10/23/20 1626 168 lb (76.2 kg)     Height 10/23/20 1626 6' (1.829 m)     Head Circumference --      Peak Flow --      Pain Score 10/23/20 1626 10     Pain Loc --      Pain Edu? --      Excl. in [place]? --     Constitutional: Alert and oriented.  Slightly uncomfortable appearing but in no acute distress. Eyes: Conjunctivae are normal.  Head: Atraumatic. Nose: No congestion/rhinnorhea. Mouth/Throat: Mucous membranes are moist.  Right pharynx with slight erythema.  No posterior oropharyngeal edema, erythema, or exudates. Neck: Normal range of motion.  Cardiovascular: Normal rate, regular rhythm. Grossly normal heart sounds.  Good peripheral circulation. Respiratory: Normal respiratory effort.  No retractions. Lungs CTAB. Gastrointestinal: Soft and nontender. No distention.  Genitourinary: No flank tenderness. Musculoskeletal: Extremities warm and well perfused.  Neurologic:  Normal speech and language. No gross focal neurologic deficits are appreciated.  Skin:  Skin is warm and dry. No rash noted. Psychiatric: Mood and affect are normal. Speech and behavior are normal.  ____________________________________________   LABS (all labs ordered are listed, but only abnormal results are  displayed)  Labs Reviewed  COMPREHENSIVE METABOLIC PANEL - Abnormal; Notable for the following components:      Result Value   Sodium 133 (*)    Chloride 96 (*)    Glucose, Bld 164 (*)    BUN  25 (*)    All other components within normal limits  CBC - Abnormal; Notable for the following components:   RBC 3.71 (*)    Hemoglobin 11.6 (*)    HCT 34.0 (*)    All other components within normal limits  MAGNESIUM - Abnormal; Notable for the following components:   Magnesium 1.5 (*)    All other components within normal limits  LIPASE, BLOOD  PHOSPHORUS  URINALYSIS, COMPLETE (UACMP) WITH MICROSCOPIC   ____________________________________________  EKG   ____________________________________________  RADIOLOGY  CXR interpreted by me shows no focal infiltrate or edema  ____________________________________________   PROCEDURES  Procedure(s) performed: No  Procedures  Critical Care performed: No ____________________________________________   INITIAL IMPRESSION / ASSESSMENT AND PLAN / ED COURSE  Pertinent labs & imaging results that were available during my care of the patient were reviewed by me and considered in my medical decision making (see chart for details).  74 year old male with PMH as noted above including nasopharyngeal cancer currently undergoing chemotherapy and radiation who presents with throat pain and difficulty swallowing.  He has been dealing with this for some time since he started the treatment, but states that it has worsened over the last several days.  He is able to handle his secretions and drink liquids, but not able to eat any solids due to the pain.   On exam, the patient is overall relatively well-appearing.  His vital signs are normal except for hypertension.  The visible oropharynx is clear except for some slight erythema on the right side which is likely due to the radiation.  I reviewed the past medical records in epic.  He was just seen at  oncology on 11/23.  His treatment cycle is supposed and on 12/1.  Overall presentation is consistent with side effects of the chemotherapy and radiation.  We will obtain a lab work-up and chest x-ray.  Since he is swallowing liquids, anticipate if the lab work-up is reassuring he should be able to go home.  ----------------------------------------- 8:41 PM on 10/23/2020 -----------------------------------------  Lab work-up is unremarkable except for hypomagnesemia.  This has been an issue for the patient previously.  His other electrolytes are normal.  We gave IV magnesium for repletion.  The patient is feeling a lot better after using viscous lidocaine.  He has a Magic mouthwash that was prescribed to him that has lidocaine in it, but states that it is not working nearly as well.  I will prescribe viscous lidocaine to use instead.  I instructed him to discontinue the Magic mouthwash if it is not working.  At this time, the patient feels significantly better.  He is stable for discharge home.  I counseled him on the results of the work-up and the plan of care.  He will follow up with his oncologist.  Return precautions given, and he expresses understanding.  ____________________________________________   FINAL CLINICAL IMPRESSION(S) / ED DIAGNOSES  Final diagnoses:  Mouth pain      NEW MEDICATIONS STARTED DURING THIS VISIT:  New Prescriptions   LIDOCAINE (XYLOCAINE) 2 % SOLUTION    Use as directed 5 mLs in the mouth or throat every 6 (six) hours as needed for mouth pain.     Note:  This document was prepared using Dragon voice recognition software and may include unintentional dictation errors.    Arta Silence, MD 10/23/20 2042

## 2020-10-23 NOTE — Discharge Instructions (Signed)
Use the viscous lidocaine prescribed today instead of the Magic mouthwash liquid that you already have.  Follow-up with Dr. Mike Gip.  Return to the ER for new, worsening, or persistent severe mouth or throat pain, difficulty swallowing, shortness of breath, feeling weak, or any other new or worsening symptoms that concern you.

## 2020-10-24 ENCOUNTER — Emergency Department: Payer: Medicare Other

## 2020-10-24 ENCOUNTER — Other Ambulatory Visit: Payer: Self-pay

## 2020-10-24 DIAGNOSIS — K121 Other forms of stomatitis: Secondary | ICD-10-CM | POA: Diagnosis present

## 2020-10-24 DIAGNOSIS — E440 Moderate protein-calorie malnutrition: Secondary | ICD-10-CM | POA: Diagnosis present

## 2020-10-24 DIAGNOSIS — R531 Weakness: Secondary | ICD-10-CM | POA: Diagnosis present

## 2020-10-24 DIAGNOSIS — Z7984 Long term (current) use of oral hypoglycemic drugs: Secondary | ICD-10-CM

## 2020-10-24 DIAGNOSIS — K208 Other esophagitis without bleeding: Secondary | ICD-10-CM | POA: Diagnosis present

## 2020-10-24 DIAGNOSIS — E860 Dehydration: Secondary | ICD-10-CM | POA: Diagnosis present

## 2020-10-24 DIAGNOSIS — D630 Anemia in neoplastic disease: Secondary | ICD-10-CM | POA: Diagnosis present

## 2020-10-24 DIAGNOSIS — Z6822 Body mass index (BMI) 22.0-22.9, adult: Secondary | ICD-10-CM

## 2020-10-24 DIAGNOSIS — F332 Major depressive disorder, recurrent severe without psychotic features: Secondary | ICD-10-CM | POA: Diagnosis present

## 2020-10-24 DIAGNOSIS — J449 Chronic obstructive pulmonary disease, unspecified: Secondary | ICD-10-CM | POA: Diagnosis present

## 2020-10-24 DIAGNOSIS — Z7902 Long term (current) use of antithrombotics/antiplatelets: Secondary | ICD-10-CM

## 2020-10-24 DIAGNOSIS — Z7982 Long term (current) use of aspirin: Secondary | ICD-10-CM

## 2020-10-24 DIAGNOSIS — Z79899 Other long term (current) drug therapy: Secondary | ICD-10-CM

## 2020-10-24 DIAGNOSIS — Z20822 Contact with and (suspected) exposure to covid-19: Secondary | ICD-10-CM | POA: Diagnosis present

## 2020-10-24 DIAGNOSIS — Z8673 Personal history of transient ischemic attack (TIA), and cerebral infarction without residual deficits: Secondary | ICD-10-CM

## 2020-10-24 DIAGNOSIS — E785 Hyperlipidemia, unspecified: Secondary | ICD-10-CM | POA: Diagnosis present

## 2020-10-24 DIAGNOSIS — I10 Essential (primary) hypertension: Secondary | ICD-10-CM | POA: Diagnosis present

## 2020-10-24 DIAGNOSIS — K219 Gastro-esophageal reflux disease without esophagitis: Secondary | ICD-10-CM | POA: Diagnosis present

## 2020-10-24 DIAGNOSIS — Z87891 Personal history of nicotine dependence: Secondary | ICD-10-CM

## 2020-10-24 DIAGNOSIS — Y842 Radiological procedure and radiotherapy as the cause of abnormal reaction of the patient, or of later complication, without mention of misadventure at the time of the procedure: Secondary | ICD-10-CM | POA: Diagnosis present

## 2020-10-24 DIAGNOSIS — K1231 Oral mucositis (ulcerative) due to antineoplastic therapy: Principal | ICD-10-CM | POA: Diagnosis present

## 2020-10-24 DIAGNOSIS — E876 Hypokalemia: Secondary | ICD-10-CM | POA: Diagnosis present

## 2020-10-24 DIAGNOSIS — Z7952 Long term (current) use of systemic steroids: Secondary | ICD-10-CM

## 2020-10-24 DIAGNOSIS — K5900 Constipation, unspecified: Secondary | ICD-10-CM | POA: Diagnosis present

## 2020-10-24 DIAGNOSIS — C119 Malignant neoplasm of nasopharynx, unspecified: Secondary | ICD-10-CM | POA: Diagnosis present

## 2020-10-24 DIAGNOSIS — Z85828 Personal history of other malignant neoplasm of skin: Secondary | ICD-10-CM

## 2020-10-24 DIAGNOSIS — Z923 Personal history of irradiation: Secondary | ICD-10-CM

## 2020-10-24 DIAGNOSIS — T451X5A Adverse effect of antineoplastic and immunosuppressive drugs, initial encounter: Secondary | ICD-10-CM | POA: Diagnosis present

## 2020-10-24 DIAGNOSIS — F419 Anxiety disorder, unspecified: Secondary | ICD-10-CM | POA: Diagnosis present

## 2020-10-24 DIAGNOSIS — Z886 Allergy status to analgesic agent status: Secondary | ICD-10-CM

## 2020-10-24 DIAGNOSIS — Z833 Family history of diabetes mellitus: Secondary | ICD-10-CM

## 2020-10-24 DIAGNOSIS — E119 Type 2 diabetes mellitus without complications: Secondary | ICD-10-CM | POA: Diagnosis present

## 2020-10-24 LAB — CBC
HCT: 31.3 % — ABNORMAL LOW (ref 39.0–52.0)
Hemoglobin: 10.6 g/dL — ABNORMAL LOW (ref 13.0–17.0)
MCH: 31.1 pg (ref 26.0–34.0)
MCHC: 33.9 g/dL (ref 30.0–36.0)
MCV: 91.8 fL (ref 80.0–100.0)
Platelets: 205 10*3/uL (ref 150–400)
RBC: 3.41 MIL/uL — ABNORMAL LOW (ref 4.22–5.81)
RDW: 15.2 % (ref 11.5–15.5)
WBC: 5.6 10*3/uL (ref 4.0–10.5)
nRBC: 0 % (ref 0.0–0.2)

## 2020-10-24 LAB — BASIC METABOLIC PANEL
Anion gap: 12 (ref 5–15)
BUN: 20 mg/dL (ref 8–23)
CO2: 25 mmol/L (ref 22–32)
Calcium: 8.6 mg/dL — ABNORMAL LOW (ref 8.9–10.3)
Chloride: 98 mmol/L (ref 98–111)
Creatinine, Ser: 0.86 mg/dL (ref 0.61–1.24)
GFR, Estimated: >60 mL/min (ref >60)
Glucose, Bld: 145 mg/dL — ABNORMAL HIGH (ref 70–99)
Potassium: 3.6 mmol/L (ref 3.5–5.1)
Sodium: 135 mmol/L (ref 135–145)

## 2020-10-24 LAB — TROPONIN I (HIGH SENSITIVITY): Troponin I (High Sensitivity): 5 ng/L (ref <18)

## 2020-10-24 MED ORDER — ONDANSETRON 4 MG PO TBDP
4.0000 mg | ORAL_TABLET | Freq: Once | ORAL | Status: AC
  Administered 2020-10-24: 4 mg via ORAL
  Filled 2020-10-24: qty 1

## 2020-10-24 MED ORDER — LIDOCAINE VISCOUS HCL 2 % MT SOLN
15.0000 mL | Freq: Once | OROMUCOSAL | Status: AC
  Administered 2020-10-24: 15 mL via OROMUCOSAL

## 2020-10-24 NOTE — ED Triage Notes (Signed)
Pt c/o of "burning in throat." Pt states he is on chemo and radiation. Pt states he has been unable to eat for the past 15 days. Pt reports he taken "20 radiation and 6 chemo." Pt denies vomiting, states he has just been nauseated.

## 2020-10-24 NOTE — ED Notes (Signed)
PT requesting something for the pain. RN spoke with Dr. Archie Balboa for verbal orders.

## 2020-10-25 ENCOUNTER — Inpatient Hospital Stay
Admission: EM | Admit: 2020-10-25 | Discharge: 2020-11-01 | DRG: 158 | Disposition: A | Discharge status: Home Health | Payer: Medicare Other | Source: Skilled Nursing Facility | Attending: Internal Medicine | Admitting: Internal Medicine

## 2020-10-25 ENCOUNTER — Encounter: Payer: Self-pay | Admitting: Internal Medicine

## 2020-10-25 DIAGNOSIS — K1231 Oral mucositis (ulcerative) due to antineoplastic therapy: Secondary | ICD-10-CM | POA: Diagnosis not present

## 2020-10-25 DIAGNOSIS — R531 Weakness: Principal | ICD-10-CM

## 2020-10-25 DIAGNOSIS — K5900 Constipation, unspecified: Secondary | ICD-10-CM | POA: Diagnosis not present

## 2020-10-25 DIAGNOSIS — K1230 Oral mucositis (ulcerative), unspecified: Secondary | ICD-10-CM

## 2020-10-25 DIAGNOSIS — K208 Other esophagitis without bleeding: Secondary | ICD-10-CM

## 2020-10-25 DIAGNOSIS — F32A Depression, unspecified: Secondary | ICD-10-CM

## 2020-10-25 DIAGNOSIS — Z8673 Personal history of transient ischemic attack (TIA), and cerebral infarction without residual deficits: Secondary | ICD-10-CM | POA: Diagnosis not present

## 2020-10-25 DIAGNOSIS — Y842 Radiological procedure and radiotherapy as the cause of abnormal reaction of the patient, or of later complication, without mention of misadventure at the time of the procedure: Secondary | ICD-10-CM | POA: Diagnosis present

## 2020-10-25 DIAGNOSIS — D649 Anemia, unspecified: Secondary | ICD-10-CM | POA: Diagnosis not present

## 2020-10-25 DIAGNOSIS — K1233 Oral mucositis (ulcerative) due to radiation: Secondary | ICD-10-CM | POA: Diagnosis not present

## 2020-10-25 DIAGNOSIS — Z85828 Personal history of other malignant neoplasm of skin: Secondary | ICD-10-CM | POA: Diagnosis not present

## 2020-10-25 DIAGNOSIS — E440 Moderate protein-calorie malnutrition: Secondary | ICD-10-CM | POA: Insufficient documentation

## 2020-10-25 DIAGNOSIS — E860 Dehydration: Secondary | ICD-10-CM

## 2020-10-25 DIAGNOSIS — E785 Hyperlipidemia, unspecified: Secondary | ICD-10-CM | POA: Diagnosis present

## 2020-10-25 DIAGNOSIS — J449 Chronic obstructive pulmonary disease, unspecified: Secondary | ICD-10-CM | POA: Diagnosis present

## 2020-10-25 DIAGNOSIS — R1310 Dysphagia, unspecified: Secondary | ICD-10-CM | POA: Diagnosis not present

## 2020-10-25 DIAGNOSIS — Z7902 Long term (current) use of antithrombotics/antiplatelets: Secondary | ICD-10-CM | POA: Diagnosis not present

## 2020-10-25 DIAGNOSIS — I10 Essential (primary) hypertension: Secondary | ICD-10-CM | POA: Diagnosis present

## 2020-10-25 DIAGNOSIS — Z923 Personal history of irradiation: Secondary | ICD-10-CM | POA: Diagnosis not present

## 2020-10-25 DIAGNOSIS — R638 Other symptoms and signs concerning food and fluid intake: Secondary | ICD-10-CM | POA: Diagnosis not present

## 2020-10-25 DIAGNOSIS — Z20822 Contact with and (suspected) exposure to covid-19: Secondary | ICD-10-CM | POA: Diagnosis present

## 2020-10-25 DIAGNOSIS — E119 Type 2 diabetes mellitus without complications: Secondary | ICD-10-CM | POA: Diagnosis not present

## 2020-10-25 DIAGNOSIS — C119 Malignant neoplasm of nasopharynx, unspecified: Secondary | ICD-10-CM | POA: Diagnosis not present

## 2020-10-25 DIAGNOSIS — E876 Hypokalemia: Secondary | ICD-10-CM | POA: Diagnosis present

## 2020-10-25 DIAGNOSIS — D630 Anemia in neoplastic disease: Secondary | ICD-10-CM | POA: Diagnosis present

## 2020-10-25 DIAGNOSIS — F419 Anxiety disorder, unspecified: Secondary | ICD-10-CM | POA: Diagnosis not present

## 2020-10-25 DIAGNOSIS — K121 Other forms of stomatitis: Secondary | ICD-10-CM | POA: Diagnosis present

## 2020-10-25 DIAGNOSIS — F332 Major depressive disorder, recurrent severe without psychotic features: Secondary | ICD-10-CM | POA: Diagnosis present

## 2020-10-25 DIAGNOSIS — K219 Gastro-esophageal reflux disease without esophagitis: Secondary | ICD-10-CM | POA: Diagnosis present

## 2020-10-25 LAB — URINALYSIS, COMPLETE (UACMP) WITH MICROSCOPIC
Bacteria, UA: NONE SEEN
Bilirubin Urine: NEGATIVE
Glucose, UA: >=500 mg/dL — AB
Hgb urine dipstick: NEGATIVE
Ketones, ur: 20 mg/dL — AB
Leukocytes,Ua: NEGATIVE
Nitrite: NEGATIVE
Protein, ur: NEGATIVE mg/dL
Specific Gravity, Urine: 1.025 (ref 1.005–1.030)
Squamous Epithelial / HPF: NONE SEEN (ref 0–5)
pH: 5 (ref 5.0–8.0)

## 2020-10-25 LAB — CBC
HCT: 28.5 % — ABNORMAL LOW (ref 39.0–52.0)
Hemoglobin: 9.6 g/dL — ABNORMAL LOW (ref 13.0–17.0)
MCH: 31.2 pg (ref 26.0–34.0)
MCHC: 33.7 g/dL (ref 30.0–36.0)
MCV: 92.5 fL (ref 80.0–100.0)
Platelets: 155 10*3/uL (ref 150–400)
RBC: 3.08 MIL/uL — ABNORMAL LOW (ref 4.22–5.81)
RDW: 15.1 % (ref 11.5–15.5)
WBC: 4.7 10*3/uL (ref 4.0–10.5)
nRBC: 0 % (ref 0.0–0.2)

## 2020-10-25 LAB — RESP PANEL BY RT-PCR (FLU A&B, COVID) ARPGX2
Influenza A by PCR: NEGATIVE
Influenza B by PCR: NEGATIVE
SARS Coronavirus 2 by RT PCR: NEGATIVE

## 2020-10-25 LAB — CREATININE, SERUM
Creatinine, Ser: 0.81 mg/dL (ref 0.61–1.24)
GFR, Estimated: >60 mL/min (ref >60)

## 2020-10-25 LAB — HEPATIC FUNCTION PANEL
ALT: 23 U/L (ref 0–44)
AST: 26 U/L (ref 15–41)
Albumin: 3.9 g/dL (ref 3.5–5.0)
Alkaline Phosphatase: 81 U/L (ref 38–126)
Bilirubin, Direct: 0.1 mg/dL (ref 0.0–0.2)
Indirect Bilirubin: 0.6 mg/dL (ref 0.3–0.9)
Total Bilirubin: 0.7 mg/dL (ref 0.3–1.2)
Total Protein: 7.6 g/dL (ref 6.5–8.1)

## 2020-10-25 LAB — GLUCOSE, CAPILLARY
Glucose-Capillary: 119 mg/dL — ABNORMAL HIGH (ref 70–99)
Glucose-Capillary: 122 mg/dL — ABNORMAL HIGH (ref 70–99)
Glucose-Capillary: 121 mg/dL — ABNORMAL HIGH (ref 70–99)
Glucose-Capillary: 107 mg/dL — ABNORMAL HIGH (ref 70–99)

## 2020-10-25 LAB — LIPASE, BLOOD: Lipase: 18 U/L (ref 11–51)

## 2020-10-25 MED ORDER — INSULIN ASPART 100 UNIT/ML ~~LOC~~ SOLN
0.0000 [IU] | Freq: Every day | SUBCUTANEOUS | Status: DC
  Filled 2020-10-25: qty 1

## 2020-10-25 MED ORDER — LIDOCAINE VISCOUS HCL 2 % MT SOLN
15.0000 mL | Freq: Once | OROMUCOSAL | Status: AC
  Administered 2020-10-25: 15 mL via OROMUCOSAL
  Filled 2020-10-25: qty 15

## 2020-10-25 MED ORDER — PANTOPRAZOLE SODIUM 20 MG PO TBEC
20.0000 mg | DELAYED_RELEASE_TABLET | Freq: Every morning | ORAL | Status: DC
  Filled 2020-10-25: qty 1

## 2020-10-25 MED ORDER — HYDROCODONE-ACETAMINOPHEN 10-325 MG PO TABS
0.5000 | ORAL_TABLET | ORAL | Status: DC | PRN
  Administered 2020-10-26 – 2020-10-30 (×2): 0.5 via ORAL
  Filled 2020-10-25 (×2): qty 1

## 2020-10-25 MED ORDER — CALCIUM CARBONATE ANTACID 500 MG PO CHEW
1.0000 | CHEWABLE_TABLET | Freq: Three times a day (TID) | ORAL | Status: DC
  Administered 2020-10-26 – 2020-11-01 (×9): 200 mg via ORAL
  Filled 2020-10-25 (×12): qty 1

## 2020-10-25 MED ORDER — LIDOCAINE VISCOUS HCL 2 % MT SOLN
15.0000 mL | OROMUCOSAL | Status: DC | PRN
  Administered 2020-10-25 – 2020-10-26 (×3): 15 mL via OROMUCOSAL
  Filled 2020-10-25 (×3): qty 15

## 2020-10-25 MED ORDER — BUSPIRONE HCL 10 MG PO TABS
10.0000 mg | ORAL_TABLET | Freq: Every day | ORAL | Status: DC
  Administered 2020-10-25 – 2020-10-31 (×6): 10 mg via ORAL
  Filled 2020-10-25 (×6): qty 1

## 2020-10-25 MED ORDER — ESCITALOPRAM OXALATE 10 MG PO TABS
20.0000 mg | ORAL_TABLET | Freq: Every morning | ORAL | Status: DC
  Administered 2020-10-26 – 2020-11-01 (×7): 20 mg via ORAL
  Filled 2020-10-25 (×9): qty 2

## 2020-10-25 MED ORDER — CLOPIDOGREL BISULFATE 75 MG PO TABS
75.0000 mg | ORAL_TABLET | Freq: Every day | ORAL | Status: DC
  Administered 2020-10-26 – 2020-11-01 (×7): 75 mg via ORAL
  Filled 2020-10-25 (×7): qty 1

## 2020-10-25 MED ORDER — ONDANSETRON HCL 4 MG PO TABS: 4.0000 mg | ORAL_TABLET | Freq: Four times a day (QID) | ORAL | Status: DC | PRN

## 2020-10-25 MED ORDER — PROCHLORPERAZINE MALEATE 10 MG PO TABS
10.0000 mg | ORAL_TABLET | Freq: Four times a day (QID) | ORAL | Status: DC | PRN
  Filled 2020-10-25: qty 1

## 2020-10-25 MED ORDER — ALPRAZOLAM 0.25 MG PO TABS
0.2500 mg | ORAL_TABLET | Freq: Every day | ORAL | Status: DC | PRN
  Administered 2020-10-25 – 2020-10-30 (×5): 0.25 mg via ORAL
  Filled 2020-10-25 (×6): qty 1

## 2020-10-25 MED ORDER — ENOXAPARIN SODIUM 40 MG/0.4ML ~~LOC~~ SOLN
40.0000 mg | SUBCUTANEOUS | Status: DC
  Administered 2020-10-25 – 2020-11-01 (×7): 40 mg via SUBCUTANEOUS
  Filled 2020-10-25 (×8): qty 0.4

## 2020-10-25 MED ORDER — ONDANSETRON HCL 4 MG/2ML IJ SOLN
4.0000 mg | Freq: Four times a day (QID) | INTRAMUSCULAR | Status: DC | PRN
  Administered 2020-10-25 – 2020-10-31 (×4): 4 mg via INTRAVENOUS
  Filled 2020-10-25 (×4): qty 2

## 2020-10-25 MED ORDER — MIRTAZAPINE 15 MG PO TABS
15.0000 mg | ORAL_TABLET | Freq: Every day | ORAL | Status: DC
  Administered 2020-10-25 – 2020-10-31 (×6): 15 mg via ORAL
  Filled 2020-10-25 (×6): qty 1

## 2020-10-25 MED ORDER — ACETAMINOPHEN 325 MG PO TABS: 650.0000 mg | ORAL_TABLET | Freq: Four times a day (QID) | ORAL | Status: DC | PRN

## 2020-10-25 MED ORDER — LOSARTAN POTASSIUM 50 MG PO TABS
100.0000 mg | ORAL_TABLET | Freq: Every morning | ORAL | Status: DC
  Administered 2020-10-26 – 2020-11-01 (×7): 100 mg via ORAL
  Filled 2020-10-25 (×7): qty 2

## 2020-10-25 MED ORDER — SODIUM FLUORIDE 1.1 % DT CREA: 1.0000 "application " | TOPICAL_CREAM | Freq: Every day | DENTAL | Status: DC

## 2020-10-25 MED ORDER — ACETAMINOPHEN 650 MG RE SUPP: 650.0000 mg | Freq: Four times a day (QID) | RECTAL | Status: DC | PRN

## 2020-10-25 MED ORDER — LIDOCAINE VISCOUS HCL 2 % MT SOLN
15.0000 mL | Freq: Four times a day (QID) | OROMUCOSAL | Status: DC | PRN
  Filled 2020-10-25: qty 15

## 2020-10-25 MED ORDER — ALUM & MAG HYDROXIDE-SIMETH 200-200-20 MG/5ML PO SUSP
30.0000 mL | Freq: Four times a day (QID) | ORAL | Status: DC | PRN
  Filled 2020-10-25: qty 30

## 2020-10-25 MED ORDER — ACETAMINOPHEN 500 MG PO TABS
1000.0000 mg | ORAL_TABLET | Freq: Once | ORAL | Status: DC
  Filled 2020-10-25: qty 2

## 2020-10-25 MED ORDER — ATORVASTATIN CALCIUM 80 MG PO TABS
80.0000 mg | ORAL_TABLET | Freq: Every evening | ORAL | Status: DC
  Administered 2020-10-26 – 2020-10-31 (×6): 80 mg via ORAL
  Filled 2020-10-25 (×3): qty 1
  Filled 2020-10-25: qty 4
  Filled 2020-10-25: qty 1
  Filled 2020-10-25: qty 4
  Filled 2020-10-25 (×4): qty 1
  Filled 2020-10-25: qty 4
  Filled 2020-10-25: qty 1

## 2020-10-25 MED ORDER — ASPIRIN EC 81 MG PO TBEC
81.0000 mg | DELAYED_RELEASE_TABLET | Freq: Every day | ORAL | Status: DC
  Administered 2020-10-28 – 2020-11-01 (×5): 81 mg via ORAL
  Filled 2020-10-25 (×8): qty 1

## 2020-10-25 MED ORDER — SODIUM CHLORIDE 0.9 % IV SOLN: INTRAVENOUS | Status: DC

## 2020-10-25 MED ORDER — MORPHINE SULFATE (PF) 2 MG/ML IV SOLN
2.0000 mg | INTRAVENOUS | Status: DC | PRN
  Administered 2020-10-25 – 2020-10-26 (×4): 2 mg via INTRAVENOUS
  Filled 2020-10-25 (×4): qty 1

## 2020-10-25 MED ORDER — BUDESONIDE 0.25 MG/2ML IN SUSP
0.2500 mg | Freq: Two times a day (BID) | RESPIRATORY_TRACT | Status: DC
  Administered 2020-10-25 – 2020-10-28 (×4): 0.25 mg via RESPIRATORY_TRACT
  Filled 2020-10-25 (×5): qty 2

## 2020-10-25 MED ORDER — POLYETHYLENE GLYCOL 3350 17 G PO PACK
17.0000 g | PACK | Freq: Every day | ORAL | Status: DC
  Administered 2020-10-26: 17 g via ORAL
  Filled 2020-10-25 (×4): qty 1

## 2020-10-25 MED ORDER — BISACODYL 10 MG RE SUPP
10.0000 mg | Freq: Every day | RECTAL | Status: DC
  Administered 2020-10-25: 10 mg via RECTAL
  Filled 2020-10-25 (×2): qty 1

## 2020-10-25 MED ORDER — MAGNESIUM OXIDE 400 (241.3 MG) MG PO TABS
400.0000 mg | ORAL_TABLET | Freq: Every day | ORAL | Status: DC
  Administered 2020-10-27 – 2020-11-01 (×6): 400 mg via ORAL
  Filled 2020-10-25 (×7): qty 1

## 2020-10-25 MED ORDER — PANTOPRAZOLE SODIUM 20 MG PO TBEC
20.0000 mg | DELAYED_RELEASE_TABLET | Freq: Two times a day (BID) | ORAL | Status: DC
  Administered 2020-10-25 – 2020-11-01 (×10): 20 mg via ORAL
  Filled 2020-10-25 (×16): qty 1

## 2020-10-25 MED ORDER — SODIUM CHLORIDE 0.9 % IV BOLUS
1000.0000 mL | Freq: Once | INTRAVENOUS | Status: AC
  Administered 2020-10-25: 1000 mL via INTRAVENOUS

## 2020-10-25 MED ORDER — INSULIN ASPART 100 UNIT/ML ~~LOC~~ SOLN: 0.0000 [IU] | Freq: Three times a day (TID) | SUBCUTANEOUS | Status: DC

## 2020-10-25 MED ORDER — LINAGLIPTIN 5 MG PO TABS
5.0000 mg | ORAL_TABLET | Freq: Every day | ORAL | Status: DC
  Administered 2020-10-26 – 2020-11-01 (×7): 5 mg via ORAL
  Filled 2020-10-25 (×10): qty 1

## 2020-10-25 NOTE — Plan of Care (Signed)
  Problem: Clinical Measurements: Goal: Will remain free from infection Outcome: Progressing Goal: Diagnostic test results will improve Outcome: Progressing Goal: Respiratory complications will improve Outcome: Progressing Goal: Cardiovascular complication will be avoided Outcome: Progressing   

## 2020-10-25 NOTE — ED Notes (Signed)
Pt in triage room resting with eyes closed and hat over face.

## 2020-10-25 NOTE — ED Notes (Signed)
Family brought to bedside. MD at bedside currently.

## 2020-10-25 NOTE — ED Notes (Signed)
Pt awake and reporting pain has returned.

## 2020-10-25 NOTE — ED Notes (Signed)
Pt reports seeing bugs on the floor for the past two hours. No bugs on the floor. Pt tearful attempting to swallow tylenol.

## 2020-10-25 NOTE — ED Notes (Signed)
RN called lab for the second time regarding add on blood testing.

## 2020-10-25 NOTE — ED Notes (Signed)
Pt ambulatory to bedside commode.

## 2020-10-25 NOTE — H&P (Signed)
History and Physical    Kenneth Franklin QIW:[id number] DOB: [birth year] DOA: 10/25/2020  PCP: Verita Lamb, NP   Patient coming from: Home  I have personally briefly reviewed patient's old medical records in [place]  Chief Complaint: Pain on swallowing, weakness, poor oral intake  HPI: Kenneth Franklin is a 74 y.o. 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  States he has had liquid diet for the past 3 weeks because of the pain.  He received Carafate, Magic mouthwash with lidocaine by his oncologist without significant improvement.  He last saw Dr. Mike Gip on 11/23.  States he had some relief with IV hydration and viscous lidocaine on his ER visit 2 days prior.  When he is trying to stay hydrated drinking several bottles of water as well as Ensure during the day he reports feeling progressively weaker and does return to the emergency room.  He does endorse nausea but denies vomiting, abdominal pain or change in bowel habits.  Denies dysuria.  Denies fever or chills, cough or shortness of breath. ED Course: On arrival, BP 126/105, pulse 106, afebrile, O2 sat 96% on room air.  Blood work for the most part unremarkable.  CBC with normal WBC, hemoglobin 10.6.  BMP unremarkable.  LFTs WNL.  Lipase 18, troponin was 5.  Chest x-ray showed no active cardiopulmonary disease.  EKG as reviewed by me : Sinus tachycardia at 102 with no acute ST-T wave changes. Patient given an IV fluid bolus, Viscous Lidocaine.  Hospitalization requested due to persistent symptoms.  Review of Systems: As per HPI otherwise all other systems on review of systems negative.    Past Medical History:  Diagnosis Date  . Acute ischemic stroke  ([place]) 2017  . Alcohol abuse   . Anxiety   . Cancer ([place])   . Depression   . Diabetes mellitus without complication ([place])   . Dyspnea    pcp knows and ordered rescue inhaler  . Hyperlipidemia   . Hypertension   . Skin cancer    Squamous Cell Carcinoma In Situ    Past Surgical History:  Procedure Laterality Date  . CATARACT EXTRACTION Bilateral   . COLONOSCOPY    . NASOPHARYNGOSCOPY N/A 08/12/2020   Procedure: ENDOSCOPIC NASOPHARYNGOSCOPY WITH BIOPSY;  Surgeon: Margaretha Sheffield, MD;  Location: [facility] ORS;  Service: ENT;  Laterality: N/A;  . PORTA CATH INSERTION N/A 08/24/2020   Procedure: PORTA CATH INSERTION;  Surgeon: Algernon Huxley, MD;  Location: [facility];  Service: Cardiovascular;  Laterality: N/A;     reports that he quit smoking about 14 months ago. His smoking use included cigars and cigarettes. He has a 40.00 pack-year smoking history. He has never used smokeless tobacco. He reports previous alcohol use. He reports that he does not use drugs.  Allergies  Allergen Reactions  . Propoxyphene     Unknown reaction    Family History  Problem Relation Age of Onset  . Diabetes Brother       Prior to Admission medications   Medication Sig Start Date End Date Taking? Authorizing Provider  ALPRAZolam (XANAX) 0.25 MG tablet TAKE 1 TABLET EVERY DAY AS NEEDED FOR ANXIETY Patient taking differently: Take 0.25 mg by mouth  daily as needed for anxiety.  10/26/16   Roselee Nova, MD  aspirin EC 81 MG tablet Take 81 mg by mouth daily. Swallow whole.    [provider]  atorvastatin (LIPITOR) 80 MG tablet TAKE 1 TABLET (80 MG TOTAL) BY MOUTH NIGHTLY. Patient taking differently: Take 80 mg by mouth every evening.  11/14/16   Roselee Nova, MD  busPIRone (BUSPAR) 10 MG tablet TAKE 1 TABLET (10 MG TOTAL) BY MOUTH 2 (TWO) TIMES DAILY. 08/16/16   Roselee Nova, MD  clopidogrel (PLAVIX) 75 MG tablet TAKE 1 TABLET (75 MG TOTAL) BY MOUTH DAILY. 07/25/16   Roselee Nova, MD  DENTA 5000 PLUS 1.1 % CREA dental cream Place 1 application onto teeth at bedtime.  03/18/20   [provider]  dexamethasone (DECADRON) 4 MG tablet Take 2 tablets (8 mg total) by mouth daily. Take daily x 3 days starting the day after cisplatin chemotherapy. Take with food. 09/08/20   Lequita Asal, MD  escitalopram (LEXAPRO) 10 MG tablet Take 1 tablet (10 mg total) by mouth daily. Stop your lexapro in a week 06/02/17 10/06/20  Fritzi Mandes, MD  escitalopram (LEXAPRO) 20 MG tablet Take 20 mg by mouth every morning.     [provider]  fluticasone (FLOVENT HFA) 110 MCG/ACT inhaler Inhale 2 puffs into the lungs daily as needed (shortness of breath).    [provider]  glipiZIDE (GLUCOTROL) 5 MG tablet Take 5 mg by mouth daily before breakfast.     [provider]  HYDROcodone-acetaminophen (NORCO) 10-325 MG tablet Take 0.5 tablets by mouth every 4 (four) hours as needed for severe pain. 10/20/20   Lequita Asal, MD  lidocaine (XYLOCAINE) 2 % solution Use as directed 5 mLs in the mouth or throat every 6 (six) hours as needed for mouth pain. 10/23/20   Arta Silence, MD  lidocaine-prilocaine (EMLA) cream Apply 1 application topically as needed. Apply small amount to port site at least 1 hour prior to it being accessed, cover with plastic wrap 08/24/20   Lequita Asal, MD  losartan (COZAAR) 100 MG tablet TAKE 1 TABLET (100 MG TOTAL) BY MOUTH DAILY. Patient taking differently: Take 100 mg by mouth every morning.  08/16/16   Roselee Nova, MD  magic mouthwash w/lidocaine SOLN Take 5 mLs by mouth 4 (four) times daily as needed for mouth pain. Sig: Swish/Spit 5-10 ml four times a day as needed. Dispense 480 ml. 1RF 10/20/20   Lequita Asal, MD  magnesium oxide (MAG-OX) 400 MG tablet Take 1 tablet by mouth daily. Patient not taking: Reported on 10/20/2020 10/06/20   [provider]  magnesium oxide (MAG-OX) 400 MG tablet TAKE 1  TABLET BY MOUTH EVERY DAY 10/21/20   Lequita Asal, MD  metFORMIN (GLUCOPHAGE) 500 MG tablet Take 1 tablet (500 mg total) by mouth daily with breakfast. 05/18/16   Roselee Nova, MD  mirtazapine (REMERON) 30 MG tablet Take 1 tablet (30 mg total) by mouth at bedtime. Patient taking differently: Take 15 mg by mouth at bedtime.  06/02/17   Fritzi Mandes, MD  nystatin cream (MYCOSTATIN) Apply 1 application topically 2 (two) times daily as needed (yeast).  05/13/20   [provider]  ondansetron (ZOFRAN) 8 MG tablet Take 1 tablet (8 mg total) by mouth 2 (two) times daily as needed. Start on the third day after cisplatin chemotherapy. 09/08/20   Lequita Asal, MD  pantoprazole (PROTONIX) 20 MG tablet Take 20 mg by mouth daily before lunch.     [provider]  prochlorperazine (COMPAZINE) 10 MG tablet Take 1 tablet (10 mg total) by mouth every 6 (six) hours as needed (Nausea or vomiting). 09/08/20   Lequita Asal, MD  sitaGLIPtin (JANUVIA) 100 MG tablet Take 100 mg by mouth every morning.     [provider]  sucralfate (CARAFATE) 1 g tablet Take 1 tablet (1 g total) by mouth 4 (four) times daily. 06/10/19 10/13/20  Duffy Bruce, MD  sucralfate (CARAFATE) 1 g tablet Take 1 tablet (1 g total) by mouth 3 (three) times daily. Dissolve in 3-4 tbsp warm water, swish and swallow. 10/19/20   Noreene Filbert, MD  VITAMIN D PO Take 1 capsule by mouth daily.    [provider]    Physical Exam: Vitals:   10/24/20 2039 10/24/20 2040 10/24/20 2325 10/25/20 0249  BP: (!) 126/105  136/73 (!) 142/72  Pulse: (!) 106  82 79  Resp: 16  16 16   Temp: 98.4 F (36.9 C)     TempSrc: Oral     SpO2: 96%  96% 99%  Weight:  75.3 kg    Height:  6' (1.829 m)       Vitals:   10/24/20 2039 10/24/20 2040 10/24/20 2325 10/25/20 0249  BP: (!) 126/105  136/73 (!) 142/72  Pulse: (!) 106  82 79  Resp: 16  16 16   Temp: 98.4 F (36.9 C)     TempSrc: Oral     SpO2: 96%   96% 99%  Weight:  75.3 kg    Height:  6' (1.829 m)        Constitutional: Alert and oriented x 3 . Not in any apparent distress HEENT:      Head: Normocephalic and atraumatic.         Eyes: PERLA, EOMI, Conjunctivae are normal. Sclera is non-icteric.       Mouth/Throat: Mucous membranes are moist.       Neck: Supple with no signs of meningismus. Cardiovascular: Regular rate and rhythm. No murmurs, gallops, or rubs. 2+ symmetrical distal pulses are present . No JVD. No LE edema Respiratory: Respiratory effort normal .Lungs sounds clear bilaterally. No wheezes, crackles, or rhonchi.  Gastrointestinal: Soft, non tender, and non distended with positive bowel sounds. No rebound or guarding. Genitourinary: No CVA tenderness. Musculoskeletal: Nontender with normal range of motion in all extremities. No cyanosis, or erythema of extremities. Neurologic:  Face is symmetric. Moving all extremities. No gross focal neurologic deficits . Skin: Skin is warm, dry.  No rash or ulcers Psychiatric: Mood and affect are normal    Labs on Admission: I have personally reviewed following labs and imaging studies  CBC: Recent Labs  Lab 10/20/20 0904 10/23/20 1631 10/24/20 2102  WBC 5.2 6.1 5.6  NEUTROABS 4.1  --   --   HGB 11.5* 11.6* 10.6*  HCT 34.8* 34.0* 31.3*  MCV 91.3 91.6 91.8  PLT 215 221 563   Basic Metabolic Panel: Recent Labs  Lab 10/20/20 0904 10/23/20 1631 10/23/20 1750 10/24/20 2102  NA 132* 133*  --  135  K 4.7 4.2  --  3.6  CL 95* 96*  --  98  CO2 28 25  --  25  GLUCOSE 134* 164*  --  145*  BUN 23 25*  --  20  CREATININE 0.80 0.93  --  0.86  CALCIUM 10.2 9.5  --  8.6*  MG 1.4*  --  1.5*  --   PHOS  --   --  2.7  --    GFR: Estimated Creatinine Clearance: 82.7 mL/min (by C-G formula based on SCr of 0.86 mg/dL). Liver Function Tests: Recent Labs  Lab 10/20/20 0904 10/23/20 1631 10/25/20 0247  AST 26 24 26   ALT 28 24 23   ALKPHOS 89 85 81  BILITOT 0.7 1.0 0.7   PROT 7.4 7.3 7.6  ALBUMIN 4.0 4.0 3.9   Recent Labs  Lab 10/23/20 1631 10/25/20 0247  LIPASE 16 18   No results for input(s): AMMONIA in the last 168 hours. Coagulation Profile: No results for input(s): INR, PROTIME in the last 168 hours. Cardiac Enzymes: No results for input(s): CKTOTAL, CKMB, CKMBINDEX, TROPONINI in the last 168 hours. BNP (last 3 results) No results for input(s): PROBNP in the last 8760 hours. HbA1C: No results for input(s): HGBA1C in the last 72 hours. CBG: No results for input(s): GLUCAP in the last 168 hours. Lipid Profile: No results for input(s): CHOL, HDL, LDLCALC, TRIG, CHOLHDL, LDLDIRECT in the last 72 hours. Thyroid Function Tests: No results for input(s): TSH, T4TOTAL, FREET4, T3FREE, THYROIDAB in the last 72 hours. Anemia Panel: No results for input(s): VITAMINB12, FOLATE, FERRITIN, TIBC, IRON, RETICCTPCT in the last 72 hours. Urine analysis:    Component Value Date/Time   COLORURINE YELLOW (A) 06/01/2017 0250   APPEARANCEUR CLEAR (A) 06/01/2017 0250   LABSPEC 1.039 (H) 06/01/2017 0250   PHURINE 5.0 06/01/2017 0250   GLUCOSEU 50 (A) 06/01/2017 0250   HGBUR NEGATIVE 06/01/2017 0250   BILIRUBINUR NEGATIVE 06/01/2017 0250   KETONESUR 5 (A) 06/01/2017 0250   PROTEINUR NEGATIVE 06/01/2017 0250   NITRITE NEGATIVE 06/01/2017 0250   LEUKOCYTESUR NEGATIVE 06/01/2017 0250    Radiological Exams on Admission: DG Chest 2 View  Result Date: 10/24/2020 CLINICAL DATA:  Chest pain, burning in throat. Chemo radiation therapy. EXAM: CHEST - 2 VIEW COMPARISON:  Chest x-ray 10/23/2020, PET CT 08/18/2020 FINDINGS: Right chest wall Port-A-Cath with tip overlying the expected region of the superior cavoatrial junction. The heart size and mediastinal contours are within normal limits. Aortic arch calcifications. Subcentimeter calcified left upper lobe granuloma again noted but not as conspicuous due to overlying vasculature. No focal consolidation. No pulmonary  edema. No pleural effusion. No pneumothorax. No acute osseous abnormality. Multilevel degenerative changes of the spine. IMPRESSION: No active cardiopulmonary disease. Electronically Signed   By: Iven Finn M.D.   On: 10/24/2020 21:30   DG Chest 2 View  Result Date: 10/23/2020 CLINICAL DATA:  Cough and dysphagia EXAM: CHEST - 2 VIEW COMPARISON:  01/13/2015 FINDINGS: Right-sided central venous port tip over the SVC. No focal opacity or pleural effusion. Normal heart size. Aortic atherosclerosis. No pneumothorax. Metallic coin or artifact projects over the left upper quadrant on frontal view. IMPRESSION: No active cardiopulmonary disease. Electronically Signed   By: Donavan Foil M.D.   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    Painful swallowing, related to mucositis due to antineoplastic therapy   Generalized weakness   Inadequate oral intake -Patient with persistent oral pain causing decreased oral intake and generalized weakness -Continue Viscous  Lidocaine every 6 as needed -Patient had minimal relief from Carafate and Magic mouthwash with lidocaine prescribed by oncologist -IV hydration -Full liquid diet as tolerated -Oncology consult in the a.m. -Consider nutritionist evaluation    Nasopharyngeal carcinoma [facility]) -Patient on concurrent radiation and chemotherapy -Oncology consult in the a.m.    Anxiety and depression -Continue home meds    Diabetes mellitus type 2, controlled, without complications ([place]) -Sliding scale insulin    DVT prophylaxis: Lovenox  Code Status: full code  Family Communication:  none  Disposition Plan: Back  to previous home environment Consults called: none  Status: Observation    Athena Masse MD [facility]     10/25/2020, 4:19 AM

## 2020-10-25 NOTE — Progress Notes (Signed)
Same day note  Patient seen and examined at bedside.  Patient was admitted to the hospital for  Oral cavity pain,, odynophagia,  At the time of my evaluation, patient complains of pain in the oral cavity unable to eat and drink. States that he has not eaten in several days now. Asking for medication to help shortness and at times.  Physical examination reveals low built male, oral cavity with erythematous mucosa over the palate region with some superficial ulceration.  Laboratory data and imaging was reviewed  Assessment and Plan.  Odynophagia, stomatitis mucositis secondary to antineoplastic therapy   Generalized weakness   Inadequate oral intake -Continue supportive care including viscous lidocaine.Patient was on Carafate and Magic mouthwash as prescribed by oncology with minimal response.  Continue IV hydration.  Full liquid diet as tolerated..  Nutrition evaluation , oncology follow-up    Nasopharyngeal carcinoma  -Patient on concurrent radiation and chemotherapy.  Oncology follow-up    Anxiety and depression Resume home medication with Xanax, buspirone, Lexapro, Remeron    Diabetes mellitus type 2, controlled, without complications  -Sliding scale insulin for now. Closely monitor. Resume sitagliptin.   Resumed home medication.  No Charge  Signed,  Delila Pereyra, MD [facility]

## 2020-10-25 NOTE — ED Provider Notes (Signed)
[facility] Emergency Department Provider Note   ____________________________________________   First MD Initiated Contact with Patient 10/25/20 0335     (approximate)  I have reviewed the triage vital signs and the nursing notes.   HISTORY  Chief Complaint Heartburn    HPI Kenneth Franklin is a 74 y.o. male who returns to the ED from home with continued throat pain, epigastric burning, inability to swallow solids and generalized weakness.  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  Is currently on Sulcrafate, Magic mouthwash with lidocaine without relief of symptoms.  He was seen in the ED 2 days ago for same and found relief with viscous lidocaine.  Discharged home with a prescription which he says is not alleviating his pain.  Up until today, he was able to take boost for liquid nutrition.  States his throat is too painful for him to swallow even liquids.  Denies fever, cough, chest pain, shortness of breath, vomiting or diarrhea.  Endorses nausea.     Past Medical History:  Diagnosis Date  . Acute ischemic stroke ([place]) 2017  . Alcohol abuse   . Anxiety   . Cancer ([place])   . Depression   . Diabetes mellitus without complication ([place])   . Dyspnea    pcp knows and ordered rescue inhaler  . Hyperlipidemia   . Hypertension   . Skin cancer    Squamous Cell Carcinoma In Situ    Patient Active Problem List   Diagnosis Date Noted  . Painful swallowing 10/25/2020  . Inadequate oral intake 10/25/2020  . Mucositis due to antineoplastic therapy 10/25/2020  . Mucositis 10/25/2020  . Dehydration 10/22/2020  . Nausea without vomiting 10/22/2020  . Poor appetite 10/22/2020  . Goals of care, counseling/discussion 09/21/2020  . Hypokalemia 09/15/2020  . 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 Encounter for antineoplastic chemotherapy 09/15/2020  . Hypomagnesemia 09/08/2020  .  Nasopharyngeal carcinoma ([place]) 08/18/2020  . Chronic obstructive pulmonary disease with acute exacerbation ([place]) 10/01/2019  . Severe recurrent major depression without psychotic features ([place]) 06/01/2017  . Alcohol abuse 06/01/2017  . Tylenol overdose 05/31/2017  . Erroneous encounter - disregard 08/02/2016  . Acute ischemic stroke ([place]) 07/07/2016  . Tobacco use disorder 06/28/2016  . Tick bite of flank 06/16/2016  . Grief counseling 06/16/2016  . Generalized weakness 06/16/2016  . Absent pulse in lower extremity 09/14/2015  . Anxiety and depression 04/21/2015  . Diabetes mellitus type 2, controlled, without complications ([place]) 26/94/8546  . Acid reflux 04/21/2015  . Calcium blood increased 04/21/2015  . Dyslipidemia 04/21/2015  . HLD (hyperlipidemia) 04/21/2015  . BP (high blood pressure) 04/21/2015    Past Surgical History:  Procedure Laterality Date  . CATARACT EXTRACTION Bilateral   . COLONOSCOPY    . NASOPHARYNGOSCOPY N/A 08/12/2020   Procedure: ENDOSCOPIC NASOPHARYNGOSCOPY WITH BIOPSY;  Surgeon: Margaretha Sheffield, MD;  Location: [facility] ORS;  Service: ENT;  Laterality: N/A;  . PORTA CATH INSERTION N/A 08/24/2020   Procedure: PORTA CATH INSERTION;  Surgeon: Algernon Huxley, MD;  Location: [facility];  Service: Cardiovascular;  Laterality: N/A;    Prior to Admission medications   Medication Sig Start Date End Date Taking? Authorizing Provider  ALPRAZolam (XANAX) 0.25 MG tablet TAKE 1 TABLET EVERY DAY AS NEEDED FOR ANXIETY Patient taking differently: Take 0.25 mg by mouth daily as needed for anxiety.  10/26/16   Roselee Nova, MD  aspirin EC 81  MG tablet Take 81 mg by mouth daily. Swallow whole.    [provider]  atorvastatin (LIPITOR) 80 MG tablet TAKE 1 TABLET (80 MG TOTAL) BY MOUTH NIGHTLY. Patient taking differently: Take 80 mg by mouth every evening.  11/14/16   Roselee Nova, MD  busPIRone (BUSPAR) 10 MG tablet TAKE 1 TABLET (10 MG TOTAL) BY MOUTH 2  (TWO) TIMES DAILY. 08/16/16   Roselee Nova, MD  clopidogrel (PLAVIX) 75 MG tablet TAKE 1 TABLET (75 MG TOTAL) BY MOUTH DAILY. 07/25/16   Roselee Nova, MD  DENTA 5000 PLUS 1.1 % CREA dental cream Place 1 application onto teeth at bedtime.  03/18/20   [provider]  dexamethasone (DECADRON) 4 MG tablet Take 2 tablets (8 mg total) by mouth daily. Take daily x 3 days starting the day after cisplatin chemotherapy. Take with food. 09/08/20   Lequita Asal, MD  escitalopram (LEXAPRO) 10 MG tablet Take 1 tablet (10 mg total) by mouth daily. Stop your lexapro in a week 06/02/17 10/06/20  Fritzi Mandes, MD  escitalopram (LEXAPRO) 20 MG tablet Take 20 mg by mouth every morning.     [provider]  fluticasone (FLOVENT HFA) 110 MCG/ACT inhaler Inhale 2 puffs into the lungs daily as needed (shortness of breath).    [provider]  glipiZIDE (GLUCOTROL) 5 MG tablet Take 5 mg by mouth daily before breakfast.     [provider]  HYDROcodone-acetaminophen (NORCO) 10-325 MG tablet Take 0.5 tablets by mouth every 4 (four) hours as needed for severe pain. 10/20/20   Lequita Asal, MD  lidocaine (XYLOCAINE) 2 % solution Use as directed 5 mLs in the mouth or throat every 6 (six) hours as needed for mouth pain. 10/23/20   Arta Silence, MD  lidocaine-prilocaine (EMLA) cream Apply 1 application topically as needed. Apply small amount to port site at least 1 hour prior to it being accessed, cover with plastic wrap 08/24/20   Lequita Asal, MD  losartan (COZAAR) 100 MG tablet TAKE 1 TABLET (100 MG TOTAL) BY MOUTH DAILY. Patient taking differently: Take 100 mg by mouth every morning.  08/16/16   Roselee Nova, MD  magic mouthwash w/lidocaine SOLN Take 5 mLs by mouth 4 (four) times daily as needed for mouth pain. Sig: Swish/Spit 5-10 ml four times a day as needed. Dispense 480 ml. 1RF 10/20/20   Lequita Asal, MD  magnesium oxide (MAG-OX) 400 MG tablet  Take 1 tablet by mouth daily. Patient not taking: Reported on 10/20/2020 10/06/20   [provider]  magnesium oxide (MAG-OX) 400 MG tablet TAKE 1 TABLET BY MOUTH EVERY DAY 10/21/20   Lequita Asal, MD  metFORMIN (GLUCOPHAGE) 500 MG tablet Take 1 tablet (500 mg total) by mouth daily with breakfast. 05/18/16   Roselee Nova, MD  mirtazapine (REMERON) 30 MG tablet Take 1 tablet (30 mg total) by mouth at bedtime. Patient taking differently: Take 15 mg by mouth at bedtime.  06/02/17   Fritzi Mandes, MD  nystatin cream (MYCOSTATIN) Apply 1 application topically 2 (two) times daily as needed (yeast).  05/13/20   [provider]  ondansetron (ZOFRAN) 8 MG tablet Take 1 tablet (8 mg total) by mouth 2 (two) times daily as needed. Start on the third day after cisplatin chemotherapy. 09/08/20   Lequita Asal, MD  pantoprazole (PROTONIX) 20 MG tablet Take 20 mg by mouth daily before lunch.  [provider]  prochlorperazine (COMPAZINE) 10 MG tablet Take 1 tablet (10 mg total) by mouth every 6 (six) hours as needed (Nausea or vomiting). 09/08/20   Lequita Asal, MD  sitaGLIPtin (JANUVIA) 100 MG tablet Take 100 mg by mouth every morning.     [provider]  sucralfate (CARAFATE) 1 g tablet Take 1 tablet (1 g total) by mouth 4 (four) times daily. 06/10/19 10/13/20  Duffy Bruce, MD  sucralfate (CARAFATE) 1 g tablet Take 1 tablet (1 g total) by mouth 3 (three) times daily. Dissolve in 3-4 tbsp warm water, swish and swallow. 10/19/20   Noreene Filbert, MD  VITAMIN D PO Take 1 capsule by mouth daily.    [provider]    Allergies Propoxyphene  Family History  Problem Relation Age of Onset  . Diabetes Brother     Social History Social History   Tobacco Use  . Smoking status: Former Smoker    Packs/day: 1.00    Years: 40.00    Pack years: 40.00    Types: Cigars, Cigarettes    Quit date: 08/12/2019    Years since quitting: 1.2  .  Smokeless tobacco: Never Used  Vaping Use  . Vaping Use: Never used  Substance Use Topics  . Alcohol use: Not Currently    Comment: H/O ETOH ABUSE BUT DENIES DRINKING DURING 08-11-20 INTERVIEW  . Drug use: No    Review of Systems  Constitutional: No fever/chills Eyes: No visual changes. ENT: Positive for sore throat. Cardiovascular: Denies chest pain. Respiratory: Denies shortness of breath. Gastrointestinal: No abdominal pain.  Positive for nausea, no vomiting.  No diarrhea.  No constipation. Genitourinary: Negative for dysuria. Musculoskeletal: Negative for back pain. Skin: Negative for rash. Neurological: Negative for headaches, focal weakness or numbness.   ____________________________________________   PHYSICAL EXAM:  VITAL SIGNS: ED Triage Vitals  Enc Vitals Group     BP 10/24/20 2039 (!) 126/105     Pulse Rate 10/24/20 2039 (!) 106     Resp 10/24/20 2039 16     Temp 10/24/20 2039 98.4 F (36.9 C)     Temp Source 10/24/20 2039 Oral     SpO2 10/24/20 2039 96 %     Weight 10/24/20 2040 166 lb (75.3 kg)     Height 10/24/20 2040 6' (1.829 m)     Head Circumference --      Peak Flow --      Pain Score 10/24/20 2040 6     Pain Loc --      Pain Edu? --      Excl. in [place]? --     Constitutional: Alert and oriented.  Chronically ill appearing and in mild acute distress. Eyes: Conjunctivae are normal. PERRL. EOMI. Head: Atraumatic. Nose: No congestion/rhinnorhea. Mouth/Throat: Mucous membranes are dry.  Posterior oropharynx raw, irritated and erythematous.  There is no hoarse or muffled voice.  There is no drooling.  Tolerating secretions well. Neck: No stridor.   Cardiovascular: Normal rate, regular rhythm. Grossly normal heart sounds.  Good peripheral circulation. Respiratory: Normal respiratory effort.  No retractions. Lungs CTAB. Gastrointestinal: Soft and nontender. No distention. No abdominal bruits. No CVA tenderness. Musculoskeletal: No lower extremity  tenderness nor edema.  No joint effusions. Neurologic:  Normal speech and language. No gross focal neurologic deficits are appreciated.  Skin:  Skin is warm, dry and intact. No rash noted.  Dry skin turgor. Psychiatric: Mood and affect are normal. Speech and behavior are normal.  ____________________________________________  LABS (all labs ordered are listed, but only abnormal results are displayed)  Labs Reviewed  BASIC METABOLIC PANEL - Abnormal; Notable for the following components:      Result Value   Glucose, Bld 145 (*)    Calcium 8.6 (*)    All other components within normal limits  CBC - Abnormal; Notable for the following components:   RBC 3.41 (*)    Hemoglobin 10.6 (*)    HCT 31.3 (*)    All other components within normal limits  RESP PANEL BY RT-PCR (FLU A&B, COVID) ARPGX2  HEPATIC FUNCTION PANEL  LIPASE, BLOOD  URINALYSIS, COMPLETE (UACMP) WITH MICROSCOPIC  TROPONIN I (HIGH SENSITIVITY)   ____________________________________________  EKG  ED ECG REPORT I, Chelsi Warr J, the attending physician, personally viewed and interpreted this ECG.   Date: 10/25/2020  EKG Time: 2048  Rate: 102  Rhythm: sinus tachycardia  Axis: Normal  Intervals:none  ST&T Change: Nonspecific  ____________________________________________  RADIOLOGY I, Cheria Sadiq J, personally viewed and evaluated these images (plain radiographs) as part of my medical decision making, as well as reviewing the written report by the radiologist.  ED MD interpretation: No acute cardiopulmonary process  Official radiology report(s): DG Chest 2 View  Result Date: 10/24/2020 CLINICAL DATA:  Chest pain, burning in throat. Chemo radiation therapy. EXAM: CHEST - 2 VIEW COMPARISON:  Chest x-ray 10/23/2020, PET CT 08/18/2020 FINDINGS: Right chest wall Port-A-Cath with tip overlying the expected region of the superior cavoatrial junction. The heart size and mediastinal contours are within normal limits. Aortic  arch calcifications. Subcentimeter calcified left upper lobe granuloma again noted but not as conspicuous due to overlying vasculature. No focal consolidation. No pulmonary edema. No pleural effusion. No pneumothorax. No acute osseous abnormality. Multilevel degenerative changes of the spine. IMPRESSION: No active cardiopulmonary disease. Electronically Signed   By: Iven Finn M.D.   On: 10/24/2020 21:30    ____________________________________________   PROCEDURES  Procedure(s) performed (including Critical Care):  Procedures   ____________________________________________   INITIAL IMPRESSION / ASSESSMENT AND PLAN / ED COURSE  As part of my medical decision making, I reviewed the following data within the [place] History obtained from family, Nursing notes reviewed and incorporated, Labs reviewed, EKG interpreted, Old chart reviewed (10/20/2020 oncology visit), Radiograph reviewed , Discussed with admitting physician Dr. Damita Dunnings and Notes from prior ED visits (10/23/2020 visit for same)     74 year old male with persistent throat pain secondary to radiation esophagitis unrelieved with Sulcrafate, Magic mouthwash and viscous lidocaine; unable to eat or drink.  Patient is clinically dehydrated and overall weak.  At one point he was hallucinating bugs in the waiting room.  He is lucid on my examination.  Will check urinalysis.  Pain currently controlled with viscous lidocaine orally.  Will initiate IV fluid resuscitation and discuss with hospitalist services for admission.      ____________________________________________   FINAL CLINICAL IMPRESSION(S) / ED DIAGNOSES  Final diagnoses:  Weakness generalized  Dehydration  Radiation esophagitis     ED Discharge Orders    None      *Please note:  Kenneth Franklin was evaluated in Emergency Department on 10/25/2020 for the symptoms described in the history of present illness. He was evaluated in the context  of the global COVID-19 pandemic, which necessitated consideration that the patient might be at risk for infection with the SARS-CoV-2 virus that causes COVID-19. Institutional protocols and algorithms that pertain to the evaluation of patients at risk for COVID-19 are in  a state of rapid change based on information released by regulatory bodies including the CDC and federal and state organizations. These policies and algorithms were followed during the patient's care in the ED.  Some ED evaluations and interventions may be delayed as a result of limited staffing during and the pandemic.*   Note:  This document was prepared using Dragon voice recognition software and may include unintentional dictation errors.   Paulette Blanch, MD 10/25/20 [phone]

## 2020-10-25 NOTE — ED Notes (Signed)
Dr. Duncan at bedside 

## 2020-10-25 NOTE — ED Notes (Signed)
Family left bedside after MD verbalized she would be admitting pt to the hospital. Number in chart if needed.

## 2020-10-26 ENCOUNTER — Ambulatory Visit: Payer: Medicare Other

## 2020-10-26 DIAGNOSIS — C119 Malignant neoplasm of nasopharynx, unspecified: Secondary | ICD-10-CM | POA: Diagnosis not present

## 2020-10-26 DIAGNOSIS — K1233 Oral mucositis (ulcerative) due to radiation: Secondary | ICD-10-CM | POA: Diagnosis not present

## 2020-10-26 DIAGNOSIS — K5900 Constipation, unspecified: Secondary | ICD-10-CM

## 2020-10-26 DIAGNOSIS — E119 Type 2 diabetes mellitus without complications: Secondary | ICD-10-CM | POA: Diagnosis not present

## 2020-10-26 DIAGNOSIS — R531 Weakness: Secondary | ICD-10-CM | POA: Diagnosis not present

## 2020-10-26 DIAGNOSIS — R1310 Dysphagia, unspecified: Secondary | ICD-10-CM | POA: Diagnosis not present

## 2020-10-26 DIAGNOSIS — F419 Anxiety disorder, unspecified: Secondary | ICD-10-CM | POA: Diagnosis not present

## 2020-10-26 DIAGNOSIS — E440 Moderate protein-calorie malnutrition: Secondary | ICD-10-CM | POA: Insufficient documentation

## 2020-10-26 DIAGNOSIS — D649 Anemia, unspecified: Secondary | ICD-10-CM

## 2020-10-26 LAB — COMPREHENSIVE METABOLIC PANEL
ALT: 16 U/L (ref 0–44)
AST: 16 U/L (ref 15–41)
Albumin: 3 g/dL — ABNORMAL LOW (ref 3.5–5.0)
Alkaline Phosphatase: 65 U/L (ref 38–126)
Anion gap: 9 (ref 5–15)
BUN: 13 mg/dL (ref 8–23)
CO2: 24 mmol/L (ref 22–32)
Calcium: 7.3 mg/dL — ABNORMAL LOW (ref 8.9–10.3)
Chloride: 103 mmol/L (ref 98–111)
Creatinine, Ser: 0.91 mg/dL (ref 0.61–1.24)
GFR, Estimated: >60 mL/min (ref >60)
Glucose, Bld: 103 mg/dL — ABNORMAL HIGH (ref 70–99)
Potassium: 3.5 mmol/L (ref 3.5–5.1)
Sodium: 136 mmol/L (ref 135–145)
Total Bilirubin: 0.9 mg/dL (ref 0.3–1.2)
Total Protein: 5.6 g/dL — ABNORMAL LOW (ref 6.5–8.1)

## 2020-10-26 LAB — CBC
HCT: 25.6 % — ABNORMAL LOW (ref 39.0–52.0)
Hemoglobin: 8.5 g/dL — ABNORMAL LOW (ref 13.0–17.0)
MCH: 30.9 pg (ref 26.0–34.0)
MCHC: 33.2 g/dL (ref 30.0–36.0)
MCV: 93.1 fL (ref 80.0–100.0)
Platelets: 145 10*3/uL — ABNORMAL LOW (ref 150–400)
RBC: 2.75 MIL/uL — ABNORMAL LOW (ref 4.22–5.81)
RDW: 15 % (ref 11.5–15.5)
WBC: 4.3 10*3/uL (ref 4.0–10.5)
nRBC: 0 % (ref 0.0–0.2)

## 2020-10-26 LAB — GLUCOSE, CAPILLARY
Glucose-Capillary: 92 mg/dL (ref 70–99)
Glucose-Capillary: 95 mg/dL (ref 70–99)
Glucose-Capillary: 85 mg/dL (ref 70–99)
Glucose-Capillary: 87 mg/dL (ref 70–99)

## 2020-10-26 LAB — HEMOGLOBIN A1C
Hgb A1c MFr Bld: 8.9 % — ABNORMAL HIGH (ref 4.8–5.6)
Mean Plasma Glucose: 208.73 mg/dL

## 2020-10-26 LAB — MAGNESIUM: Magnesium: 1.2 mg/dL — ABNORMAL LOW (ref 1.7–2.4)

## 2020-10-26 MED ORDER — MAGNESIUM SULFATE 2 GM/50ML IV SOLN
2.0000 g | Freq: Once | INTRAVENOUS | Status: AC
  Administered 2020-10-26: 2 g via INTRAVENOUS
  Filled 2020-10-26: qty 50

## 2020-10-26 MED ORDER — POTASSIUM CHLORIDE 20 MEQ PO PACK: 20.0000 meq | PACK | Freq: Once | ORAL | Status: DC

## 2020-10-26 MED ORDER — MAGIC MOUTHWASH
15.0000 mL | Freq: Four times a day (QID) | ORAL | Status: DC | PRN
  Filled 2020-10-26: qty 15
  Filled 2020-10-26: qty 20

## 2020-10-26 MED ORDER — POLYVINYL ALCOHOL 1.4 % OP SOLN
1.0000 [drp] | OPHTHALMIC | Status: DC | PRN
  Administered 2020-10-26 – 2020-10-31 (×2): 1 [drp] via OPHTHALMIC
  Filled 2020-10-26: qty 15

## 2020-10-26 MED ORDER — ADULT MULTIVITAMIN W/MINERALS CH
1.0000 | ORAL_TABLET | Freq: Every day | ORAL | Status: DC
  Administered 2020-10-28 – 2020-11-01 (×5): 1 via ORAL
  Filled 2020-10-26 (×6): qty 1

## 2020-10-26 MED ORDER — MORPHINE SULFATE (PF) 4 MG/ML IV SOLN
4.0000 mg | INTRAVENOUS | Status: DC | PRN
  Administered 2020-10-26 – 2020-10-29 (×7): 4 mg via INTRAVENOUS
  Filled 2020-10-26 (×7): qty 1

## 2020-10-26 MED ORDER — ENSURE ENLIVE PO LIQD
237.0000 mL | Freq: Three times a day (TID) | ORAL | Status: DC
  Administered 2020-10-26 – 2020-11-01 (×14): 237 mL via ORAL

## 2020-10-26 MED ORDER — LIDOCAINE VISCOUS HCL 2 % MT SOLN
15.0000 mL | OROMUCOSAL | Status: DC
  Administered 2020-10-26 – 2020-11-01 (×24): 15 mL via OROMUCOSAL
  Filled 2020-10-26 (×38): qty 15

## 2020-10-26 MED ORDER — MAGIC MOUTHWASH
15.0000 mL | Freq: Four times a day (QID) | ORAL | Status: DC
  Administered 2020-10-26 – 2020-11-01 (×15): 15 mL via ORAL
  Filled 2020-10-26: qty 20
  Filled 2020-10-26 (×9): qty 15
  Filled 2020-10-26: qty 20
  Filled 2020-10-26 (×6): qty 15
  Filled 2020-10-26 (×2): qty 20
  Filled 2020-10-26: qty 15
  Filled 2020-10-26 (×2): qty 20
  Filled 2020-10-26 (×6): qty 15

## 2020-10-26 MED ORDER — KETOROLAC TROMETHAMINE 15 MG/ML IJ SOLN: 15.0000 mg | Freq: Four times a day (QID) | INTRAMUSCULAR | Status: AC | PRN

## 2020-10-26 NOTE — Progress Notes (Addendum)
PROGRESS NOTE  Kenneth Franklin WNI:[id number] DOB: 08-18-1946 DOA: 10/25/2020 PCP: Verita Lamb, NP   LOS: 1 day   Brief narrative: As per HPI,  Kenneth Franklin is a 74 y.o. 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  Patient was seen by oncology as outpatient and was on Magic mouthwash with lidocaine Carafate without much relief.  Patient had seen Dr. Mike Gip on 11/23.    Patient tried to drink several bottles of water as well as Ensure during the day as he reported feeling progressively weaker and did return to the emergency room.  He also complained of nausea but no vomiting abdominal pain.  AAA felt constipated.  In the ED vitals were within normal limits. EKG showed sinus tachycardia.  Patient was given IV fluid bolus, Viscous Lidocaine.  Hospitalization was then requested due to persistent symptoms.  Assessment/Plan:  Principal Problem:   Painful swallowing Active Problems:   Anxiety and depression   Diabetes mellitus type 2, controlled, without complications (HCC)   Generalized weakness   Nasopharyngeal carcinoma (HCC)   Inadequate oral intake   Mucositis due to antineoplastic therapy   Mucositis due to radiation therapy   Odynophagia   Odynophagia, stomatitis mucositis secondary to antineoplastic/radiation therapy Continue supportive care including viscous lidocaine, IV morphine.   Continue IV hydration.  Full liquid diet as tolerated.. Continue to monitor closely.  Spoke with oncology consultation.  On lidocaine viscous. Will add Magic mouthwash as well.  Would appreciate oncology evaluation and further input on this.  Painful swallowing so patient is reluctant to eat.  Encouraged oral hydration,  crushing pills.  Spoke with the nursing staff.  Nasopharyngeal carcinoma  Currently on concurrent radiation and chemotherapy.  Communicated with oncology today for follow-up.  Anxiety/ depression Resumed home medication with Xanax, buspirone, Lexapro, Remeron  Diabetes mellitus type 2, controlled, without complications  Continue sitagliptin, sliding scale insulin, closely monitor blood glucose levels.  Latest POC glucose of 92.    Hypomagnesemia.  Magnesium of 1.2 today.  Will give 2 g of IV magnesium sulfate.  Will repeat tonight.  Closely monitor.  Generalized weakness /Inadequate oral intake/ moderate protein calorie malnutrition. Present on admission.  Nutrition evaluation.  PT evaluation. Recommend nutritional supplements.   DVT prophylaxis: enoxaparin (LOVENOX) injection 40 mg Start: 10/25/20 1000   Code Status: Full code  Family Communication: Spoke with the patient's brother on the phone and updated him about the clinical condition of the patient.   Status is: Inpatient  Remains inpatient appropriate because:IV treatments appropriate due to intensity of illness or inability to take PO, Inpatient level of care appropriate due to severity of illness and Mucositis poor oral intake odynophagia IV fluids and IV narcotics   Dispo: The patient is from: Home              Anticipated d/c is to: Home              Anticipated d/c date is: 2 days              Patient currently is not medically stable to d/c.   Consultants:  Oncology  Procedures:  None  Antibiotics:  . None  Anti-infectives (From admission, onward)   None  Subjective: Today, patient was seen and examined at bedside.  Patient states that he has severe burning  pain to oral cavity limiting oral intake.  Got little better with IV morphine.  Constipated and required laxatives yesterday   Objective: Vitals:   10/26/20 0335 10/26/20 0728  BP: 139/62 123/64  Pulse: 72 65  Resp: 17 16   Temp: 98.9 F (37.2 C) 98.4 F (36.9 C)  SpO2: 98% 100%    Intake/Output Summary (Last 24 hours) at 10/26/2020 0837 Last data filed at 10/25/2020 1439 Gross per 24 hour  Intake 663.41 ml  Output --  Net 663.41 ml   Filed Weights   10/24/20 2040  Weight: 75.3 kg   Body mass index is 22.51 kg/m.   Physical Exam:  GENERAL: Patient is alert awake and oriented.  Thinly built, HENT: No scleral pallor or icterus. Pupils equally reactive to light.  Erythematous area with superficial denudation over the palate region. NECK: is supple, no gross swelling noted. CHEST: Clear to auscultation. No crackles or wheezes.  Diminished breath sounds bilaterally. CVS: S1 and S2 heard, no murmur. Regular rate and rhythm.  ABDOMEN: Soft, non-tender, bowel sounds are present. EXTREMITIES: No edema. CNS: Cranial nerves are intact. No focal motor deficits. SKIN: warm and dry without rashes.  Data Review: I have personally reviewed the following laboratory data and studies,  CBC: Recent Labs  Lab 10/20/20 0904 10/23/20 1631 10/24/20 2102 10/25/20 0522 10/26/20 0608  WBC 5.2 6.1 5.6 4.7 4.3  NEUTROABS 4.1  --   --   --   --   HGB 11.5* 11.6* 10.6* 9.6* 8.5*  HCT 34.8* 34.0* 31.3* 28.5* 25.6*  MCV 91.3 91.6 91.8 92.5 93.1  PLT 215 221 205 155 024*   Basic Metabolic Panel: Recent Labs  Lab 10/20/20 0904 10/23/20 1631 10/23/20 1750 10/24/20 2102 10/25/20 0522 10/26/20 0608  NA 132* 133*  --  135  --  136  K 4.7 4.2  --  3.6  --  3.5  CL 95* 96*  --  98  --  103  CO2 28 25  --  25  --  24  GLUCOSE 134* 164*  --  145*  --  103*  BUN 23 25*  --  20  --  13  CREATININE 0.80 0.93  --  0.86 0.81 0.91  CALCIUM 10.2 9.5  --  8.6*  --  7.3*  MG 1.4*  --  1.5*  --   --  1.2*  PHOS  --   --  2.7  --   --   --    Liver Function Tests: Recent Labs  Lab 10/20/20 0904 10/23/20 1631 10/25/20 0247 10/26/20 0608  AST 26 24 26 16   ALT 28 24 23 16   ALKPHOS 89 85 81 65  BILITOT 0.7 1.0  0.7 0.9  PROT 7.4 7.3 7.6 5.6*  ALBUMIN 4.0 4.0 3.9 3.0*   Recent Labs  Lab 10/23/20 1631 10/25/20 0247  LIPASE 16 18   No results for input(s): AMMONIA in the last 168 hours. Cardiac Enzymes: No results for input(s): CKTOTAL, CKMB, CKMBINDEX, TROPONINI in the last 168 hours. BNP (last 3 results) No results for input(s): BNP in the last 8760 hours.  ProBNP (last 3 results) No results for input(s): PROBNP in the last 8760 hours.  CBG: Recent Labs  Lab 10/25/20 0921 10/25/20 1141 10/25/20 1642 10/25/20 2143 10/26/20 0730  GLUCAP 119* 122* 121* 107* 92   Recent Results (from the past  240 hour(s))  Resp Panel by RT-PCR (Flu A&B, Covid) Nasopharyngeal Swab     Status: None   Collection Time: 10/25/20  4:07 AM   Specimen: Nasopharyngeal Swab; Nasopharyngeal(NP) swabs in vial transport medium  Result Value Ref Range Status   SARS Coronavirus 2 by RT PCR NEGATIVE NEGATIVE Final    Comment: (NOTE) SARS-CoV-2 target nucleic acids are NOT DETECTED.  The SARS-CoV-2 RNA is generally detectable in upper respiratory specimens during the acute phase of infection. The lowest concentration of SARS-CoV-2 viral copies this assay can detect is 138 copies/mL. A negative result does not preclude SARS-Cov-2 infection and should not be used as the sole basis for treatment or other patient management decisions. A negative result may occur with  improper specimen collection/handling, submission of specimen other than nasopharyngeal swab, presence of viral mutation(s) within the areas targeted by this assay, and inadequate number of viral copies(<138 copies/mL). A negative result must be combined with clinical observations, patient history, and epidemiological information. The expected result is Negative.  Fact Sheet for Patients:  EntrepreneurPulse.com.au  Fact Sheet for Healthcare Providers:  IncredibleEmployment.be  This test is no t yet approved or  cleared by the [place] FDA and  has been authorized for detection and/or diagnosis of SARS-CoV-2 by FDA under an Emergency Use Authorization (EUA). This EUA will remain  in effect (meaning this test can be used) for the duration of the COVID-19 declaration under Section 564(b)(1) of the Act, 21 U.S.C.section 360bbb-3(b)(1), unless the authorization is terminated  or revoked sooner.       Influenza A by PCR NEGATIVE NEGATIVE Final   Influenza B by PCR NEGATIVE NEGATIVE Final    Comment: (NOTE) The Xpert Xpress SARS-CoV-2/FLU/RSV plus assay is intended as an aid in the diagnosis of influenza from Nasopharyngeal swab specimens and should not be used as a sole basis for treatment. Nasal washings and aspirates are unacceptable for Xpert Xpress SARS-CoV-2/FLU/RSV testing.  Fact Sheet for Patients: EntrepreneurPulse.com.au  Fact Sheet for Healthcare Providers: IncredibleEmployment.be  This test is not yet approved or cleared by the [place] FDA and has been authorized for detection and/or diagnosis of SARS-CoV-2 by FDA under an Emergency Use Authorization (EUA). This EUA will remain in effect (meaning this test can be used) for the duration of the COVID-19 declaration under Section 564(b)(1) of the Act, 21 U.S.C. section 360bbb-3(b)(1), unless the authorization is terminated or revoked.  Performed at [facility], [address], [place], [place] [address]      Studies: DG Chest 2 View  Result Date: 10/24/2020 CLINICAL DATA:  Chest pain, burning in throat. Chemo radiation therapy. EXAM: CHEST - 2 VIEW COMPARISON:  Chest x-ray 10/23/2020, PET CT 08/18/2020 FINDINGS: Right chest wall Port-A-Cath with tip overlying the expected region of the superior cavoatrial junction. The heart size and mediastinal contours are within normal limits. Aortic arch calcifications. Subcentimeter calcified left upper lobe granuloma again noted but  not as conspicuous due to overlying vasculature. No focal consolidation. No pulmonary edema. No pleural effusion. No pneumothorax. No acute osseous abnormality. Multilevel degenerative changes of the spine. IMPRESSION: No active cardiopulmonary disease. Electronically Signed   By: Iven Finn M.D.   On: 10/24/2020 21:30      Flora Lipps, MD  [facility] 10/26/2020

## 2020-10-26 NOTE — Progress Notes (Signed)
Initial Nutrition Assessment  DOCUMENTATION CODES:   Non-severe (moderate) malnutrition in context of chronic illness  INTERVENTION:   Ensure Enlive po TID, each supplement provides 350 kcal and 20 grams of protein  Magic cup TID with meals, each supplement provides 290 kcal and 9 grams of protein  MVI daily   Pt at moderate refeed risk; recommend monitor potassium, magnesium and phosphorus labs daily until stable'  NUTRITION DIAGNOSIS:   Moderate Malnutrition related to cancer and cancer related treatments as evidenced by moderate fat depletion, severe fat depletion, moderate muscle depletion.  GOAL:   Patient will meet greater than or equal to 90% of their needs  MONITOR:   PO intake, Supplement acceptance, Labs, Weight trends, Skin, I & O's  REASON FOR ASSESSMENT:   Malnutrition Screening Tool    ASSESSMENT:   74 y.o. 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 Pt reports poor appetite and oral intake for 2 weeks pta r/t sore throat, difficulty swallowing and loss of taste. Pt reports that he has been drinking 3-4 Ensure per day but that he just is not able to taste it very well. Pt reports that Ensure causes him to get constipation so he takes daily miralax. Pt reports that cold things feel better on his throat; he reports that he enjoys ice cream but that he has not been eating it because he can't taste it. Pt reports that he has not been taking any vitamins at home. Per chart, pt is down 6lbs(4%) over the past 6 months; this is not significant. RD discussed with pt the importance of adequate nutrition needed to preserve lean muscle. Pt is willing to drink chocolate Ensure and eat Magic Cups in hospital. RD will add supplements and MVI to help pt meet his esitimated needs. Pt is likely at refeed  risk.   Medications reviewed and include: aspirin, dulcolax, plavix, lovenox, insulin, Mg oxide, remeron, protonix, miralax, KCl, Mg sulfate, NaCl '@75ml' /hr  Labs reviewed: K 3.5 wnl, Mg 1.2(L) Hgb 8.5(L), Hct 25.6(L)  NUTRITION - FOCUSED PHYSICAL EXAM:    Most Recent Value  Orbital Region Mild depletion  Upper Arm Region Severe depletion  Thoracic and Lumbar Region Severe depletion  Buccal Region Mild depletion  Temple Region Mild depletion  Clavicle Bone Region Moderate depletion  Clavicle and Acromion Bone Region Moderate depletion  Scapular Bone Region Unable to assess  Dorsal Hand Moderate depletion  Patellar Region Moderate depletion  Anterior Thigh Region Moderate depletion  Posterior Calf Region Severe depletion  Edema (RD Assessment) None  Hair Reviewed  Eyes Reviewed  Mouth Reviewed  Skin Reviewed  Nails Reviewed     Diet Order:   Diet Order            Diet full liquid Room service appropriate? Yes; Fluid consistency: Thin  Diet effective now                EDUCATION NEEDS:   Education needs have been addressed  Skin:  Skin Assessment: Reviewed RN Assessment  Last BM:  11/28- type 2  Height:   Ht Readings from Last 1 Encounters:  10/24/20 6' (1.829 m)    Weight:   Wt Readings from Last 1 Encounters:  10/26/20 74.5 kg    Ideal Body Weight:  80.9 kg  BMI:  Body mass index is 22.28 kg/m.  Estimated  Nutritional Needs:   Kcal:  2000-2300kcal/day  Protein:  100-115g/day  Fluid:  1.9-2.2L/day  Koleen Distance MS, RD, LDN Please refer to [facility] for RD and/or RD on-call/weekend/after hours pager

## 2020-10-26 NOTE — Progress Notes (Signed)
MD notified of patient's pain and short duration of comfort. New orders for increased morphine and additional comfort medications.

## 2020-10-26 NOTE — Consult Note (Signed)
[facility]  Date of admission:  10/25/2020  Inpatient day:  10/26/2020  Consulting physician:  Dr Flora Lipps.  Reason for Consultation:  Nasopharyngeal carcinoma.  Chief Complaint: Kenneth Franklin is a 74 y.o. male with stage III nasopharyngeal carcinoma who was admitted through the emergency room with odynophagia and decreased oral intake.  HPI: The patient was diagnosed with nasopharyngeal carcinoma on 08/12/2020.  PET scan revealed a 5.7 x 4.2 cm destructive hypermetabolic mass (SUV 22.48) involving the sphenoid sinus, skull base and posterior nasopharynx.  This was consistent with patient's known squamous cell carcinoma. There was no locoregional lymphadenopathy or distant metastatic disease.  He began IMRT on 09/08/2020.  He is s/p 6 weeks of cisplatin (09/08/2020 - 10/20/2020).  He was last seen in the medical oncology clinic on 10/20/2020.  He notes mouth pain with a burning sensation on the right side of his mouth.  He couldn't taste anything.  He was drinking 4-5 Boosts or Ensure/day and 5 bottles of water/day.    He continued Norco 10-325 1/2 tablet every 4 hours prn pain.  He was presrcibed Magic mouthwash.  Patient presented to the ER with painful swallowing poorly controlled with Norco, Magic mouthwash, and Carafate.  He has been unable to tolerate solids.  He has ben drinking Ensure and water.  He denies nausea, vomiting or diarrhea.  He notes constipation.   Past Medical History:  Diagnosis Date  . Acute ischemic stroke ([place]) 2017  . Alcohol abuse   . Anxiety   . Cancer ([place])   . Depression   . Diabetes mellitus without complication ([place])   . Dyspnea    pcp knows and ordered rescue inhaler  . Hyperlipidemia   . Hypertension   . Skin cancer    Squamous Cell Carcinoma In Situ    Past Surgical History:  Procedure Laterality Date  . CATARACT EXTRACTION Bilateral   . COLONOSCOPY    . NASOPHARYNGOSCOPY N/A 08/12/2020   Procedure: ENDOSCOPIC  NASOPHARYNGOSCOPY WITH BIOPSY;  Surgeon: Margaretha Sheffield, MD;  Location: [facility] ORS;  Service: ENT;  Laterality: N/A;  . PORTA CATH INSERTION N/A 08/24/2020   Procedure: PORTA CATH INSERTION;  Surgeon: Algernon Huxley, MD;  Location: [facility];  Service: Cardiovascular;  Laterality: N/A;    Family History  Problem Relation Age of Onset  . Diabetes Brother     Social History:  reports that he quit smoking about 14 months ago. His smoking use included cigars and cigarettes. He has a 40.00 pack-year smoking history. He has never used smokeless tobacco. He reports previous alcohol use. He reports that he does not use drugs.  The patient denies any exposure to radiation or toxins.  The patient lives in [place].  He is alone today.  Allergies:  Allergies  Allergen Reactions  . Propoxyphene     Unknown reaction    Medications Prior to Admission  Medication Sig Dispense Refill  . ALPRAZolam (XANAX) 0.25 MG tablet TAKE 1 TABLET EVERY DAY AS NEEDED FOR ANXIETY (Patient taking differently: Take 0.25 mg by mouth daily as needed for anxiety. ) 30 tablet 2  . aspirin EC 81 MG tablet Take 81 mg by mouth daily. Swallow whole.    Marland Kitchen atorvastatin (LIPITOR) 80 MG tablet TAKE 1 TABLET (80 MG TOTAL) BY MOUTH NIGHTLY. (Patient taking differently: Take 80 mg by mouth every evening. ) 90 tablet 0  . busPIRone (BUSPAR) 10 MG tablet TAKE 1 TABLET (10 MG TOTAL) BY MOUTH  2 (TWO) TIMES DAILY. (Patient taking differently: Take 10 mg by mouth at bedtime. ) 180 tablet 0  . clopidogrel (PLAVIX) 75 MG tablet TAKE 1 TABLET (75 MG TOTAL) BY MOUTH DAILY. 90 tablet 0  . dexamethasone (DECADRON) 4 MG tablet Take 2 tablets (8 mg total) by mouth daily. Take daily x 3 days starting the day after cisplatin chemotherapy. Take with food. 30 tablet 1  . escitalopram (LEXAPRO) 20 MG tablet Take 20 mg by mouth every morning.     . fluticasone (FLOVENT HFA) 110 MCG/ACT inhaler Inhale 2 puffs into the lungs daily as needed (shortness  of breath).    Marland Kitchen glipiZIDE (GLUCOTROL) 5 MG tablet Take 5 mg by mouth daily before breakfast.     . HYDROcodone-acetaminophen (NORCO) 10-325 MG tablet Take 0.5 tablets by mouth every 4 (four) hours as needed for severe pain. 30 tablet 0  . lidocaine (XYLOCAINE) 2 % solution Use as directed 5 mLs in the mouth or throat every 6 (six) hours as needed for mouth pain. 100 mL 0  . lidocaine-prilocaine (EMLA) cream Apply 1 application topically as needed. Apply small amount to port site at least 1 hour prior to it being accessed, cover with plastic wrap 30 g 1  . losartan (COZAAR) 100 MG tablet TAKE 1 TABLET (100 MG TOTAL) BY MOUTH DAILY. (Patient taking differently: Take 100 mg by mouth every morning. ) 90 tablet 0  . metFORMIN (GLUCOPHAGE) 500 MG tablet Take 1 tablet (500 mg total) by mouth daily with breakfast. 90 tablet 0  . mirtazapine (REMERON) 30 MG tablet Take 1 tablet (30 mg total) by mouth at bedtime. (Patient taking differently: Take 15 mg by mouth at bedtime. ) 30 tablet 0  . ondansetron (ZOFRAN) 8 MG tablet Take 1 tablet (8 mg total) by mouth 2 (two) times daily as needed. Start on the third day after cisplatin chemotherapy. 30 tablet 1  . pantoprazole (PROTONIX) 20 MG tablet Take 20 mg by mouth daily before lunch.     . prochlorperazine (COMPAZINE) 10 MG tablet Take 1 tablet (10 mg total) by mouth every 6 (six) hours as needed (Nausea or vomiting). 30 tablet 1  . sitaGLIPtin (JANUVIA) 100 MG tablet Take 100 mg by mouth every morning.     . sodium fluoride (LURIDE) 1.1 (0.5 F) MG/ML SOLN Take 1 drop by mouth at bedtime. One dental application at bedtime.    . sucralfate (CARAFATE) 1 g tablet Take 1 tablet (1 g total) by mouth 3 (three) times daily. Dissolve in 3-4 tbsp warm water, swish and swallow. 90 tablet 3  . VITAMIN D PO Take 1 capsule by mouth daily.    . DENTA 5000 PLUS 1.1 % CREA dental cream Place 1 application onto teeth at bedtime.     Marland Kitchen escitalopram (LEXAPRO) 10 MG tablet Take 1  tablet (10 mg total) by mouth daily. Stop your lexapro in a week 6 tablet 0  . magic mouthwash w/lidocaine SOLN Take 5 mLs by mouth 4 (four) times daily as needed for mouth pain. Sig: Swish/Spit 5-10 ml four times a day as needed. Dispense 480 ml. 1RF (Patient not taking: Reported on 10/25/2020) 480 mL 1  . magnesium oxide (MAG-OX) 400 MG tablet TAKE 1 TABLET BY MOUTH EVERY DAY (Patient not taking: Reported on 10/25/2020) 30 tablet 0  . nystatin cream (MYCOSTATIN) Apply 1 application topically 2 (two) times daily as needed (yeast).     . sucralfate (CARAFATE) 1 g tablet Take 1 tablet (  1 g total) by mouth 4 (four) times daily. 120 tablet 11    Review of Systems  Constitutional: Positive for malaise/fatigue. Negative for diaphoresis and fever.  HENT: Negative for congestion, ear pain, nosebleeds and sinus pain.        Mouth dry.  Throat painful.  Eyes: Negative.  Negative for blurred vision and double vision.  Respiratory: Negative.  Negative for cough, hemoptysis, sputum production, shortness of breath and wheezing.   Cardiovascular: Negative.  Negative for chest pain, palpitations and orthopnea.  Gastrointestinal: Positive for constipation. Negative for abdominal pain, blood in stool, diarrhea, heartburn, melena, nausea and vomiting.       Unable to eat solids.  Drinking liquids.  Genitourinary: Negative.  Negative for dysuria, frequency and urgency.  Musculoskeletal: Negative.  Negative for back pain, joint pain, myalgias and neck pain.  Skin: Negative.  Negative for itching and rash.  Neurological: Positive for weakness (genealized). Negative for dizziness, tingling, tremors, sensory change, speech change, focal weakness and headaches.  Endo/Heme/Allergies: Negative.  Does not bruise/bleed easily.  Psychiatric/Behavioral: Negative.  Negative for depression and memory loss. The patient is not nervous/anxious and does not have insomnia.      Vitals:  Blood pressure (!) 146/69, pulse 67,  temperature 98.1 F (36.7 C), resp. rate 18, height 6' (1.829 m), weight 164 lb 4.8 oz (74.5 kg), SpO2 97 %.   Physical Exam Vitals and nursing note reviewed.  Constitutional:      General: He is not in acute distress.    Appearance: He is not diaphoretic.     Comments: Chronically fatigued appearing gentleman sitting up in bed on the medical oncology unit in no acute distress.  HENT:     Head: Normocephalic and atraumatic.     Mouth/Throat:     Comments: Lips dry with decreased ability to open mouth fully.  Mucositis between hard and soft palate. Eyes:     General: No scleral icterus.    Conjunctiva/sclera: Conjunctivae normal.     Pupils: Pupils are equal, round, and reactive to light.  Cardiovascular:     Rate and Rhythm: Normal rate.  Pulmonary:     Effort: Pulmonary effort is normal.     Breath sounds: Normal breath sounds. No wheezing, rhonchi or rales.  Abdominal:     General: Bowel sounds are normal. There is no distension.     Palpations: There is no mass.     Tenderness: There is no abdominal tenderness. There is no guarding or rebound.  Musculoskeletal:        General: No swelling or tenderness. Normal range of motion.     Cervical back: Normal range of motion.     Right lower leg: No edema.     Left lower leg: No edema.  Lymphadenopathy:     Cervical: No cervical adenopathy.  Skin:    General: Skin is warm and dry.     Comments: Thickened dry skin at neck s/p radiation.  Neurological:     General: No focal deficit present.     Mental Status: He is alert. Mental status is at baseline.  Psychiatric:        Mood and Affect: Mood normal.        Behavior: Behavior normal.        Thought Content: Thought content normal.        Judgment: Judgment normal.      Results for orders placed or performed during the hospital encounter of 10/25/20 (from the past 48  hour(s))  Hepatic function panel     Status: None   Collection Time: 10/25/20  2:47 AM  Result Value Ref  Range   Total Protein 7.6 6.5 - 8.1 g/dL   Albumin 3.9 3.5 - 5.0 g/dL   AST 26 15 - 41 U/L   ALT 23 0 - 44 U/L   Alkaline Phosphatase 81 38 - 126 U/L   Total Bilirubin 0.7 0.3 - 1.2 mg/dL   Bilirubin, Direct 0.1 0.0 - 0.2 mg/dL   Indirect Bilirubin 0.6 0.3 - 0.9 mg/dL    Comment: Performed at [facility], [address] [place], [place] [address]  Lipase, blood     Status: None   Collection Time: 10/25/20  2:47 AM  Result Value Ref Range   Lipase 18 11 - 51 U/L    Comment: Performed at [facility], [address] [place], [place] [address]  Urinalysis, Complete w Microscopic Urine, Random     Status: Abnormal   Collection Time: 10/25/20  4:07 AM  Result Value Ref Range   Color, Urine YELLOW (A) YELLOW   APPearance CLEAR (A) CLEAR   Specific Gravity, Urine 1.025 1.005 - 1.030   pH 5.0 5.0 - 8.0   Glucose, UA >=500 (A) NEGATIVE mg/dL   Hgb urine dipstick NEGATIVE NEGATIVE   Bilirubin Urine NEGATIVE NEGATIVE   Ketones, ur 20 (A) NEGATIVE mg/dL   Protein, ur NEGATIVE NEGATIVE mg/dL   Nitrite NEGATIVE NEGATIVE   Leukocytes,Ua NEGATIVE NEGATIVE   RBC / HPF 0-5 0 - 5 RBC/hpf   WBC, UA 0-5 0 - 5 WBC/hpf   Bacteria, UA NONE SEEN NONE SEEN   Squamous Epithelial / LPF NONE SEEN 0 - 5    Comment: Performed at [facility], [address] [place], [place] [address]  Resp Panel by RT-PCR (Flu A&B, Covid) Nasopharyngeal Swab     Status: None   Collection Time: 10/25/20  4:07 AM   Specimen: Nasopharyngeal Swab; Nasopharyngeal(NP) swabs in vial transport medium  Result Value Ref Range   SARS Coronavirus 2 by RT PCR NEGATIVE NEGATIVE    Comment: (NOTE) SARS-CoV-2 target nucleic acids are NOT DETECTED.  The SARS-CoV-2 RNA is generally detectable in upper respiratory specimens during the acute phase of infection. The lowest concentration of SARS-CoV-2 viral copies this assay can detect is 138 copies/mL. A negative result does not preclude  SARS-Cov-2 infection and should not be used as the sole basis for treatment or other patient management decisions. A negative result may occur with  improper specimen collection/handling, submission of specimen other than nasopharyngeal swab, presence of viral mutation(s) within the areas targeted by this assay, and inadequate number of viral copies(<138 copies/mL). A negative result must be combined with clinical observations, patient history, and epidemiological information. The expected result is Negative.  Fact Sheet for Patients:  EntrepreneurPulse.com.au  Fact Sheet for Healthcare Providers:  IncredibleEmployment.be  This test is no t yet approved or cleared by the [place] FDA and  has been authorized for detection and/or diagnosis of SARS-CoV-2 by FDA under an Emergency Use Authorization (EUA). This EUA will remain  in effect (meaning this test can be used) for the duration of the COVID-19 declaration under Section 564(b)(1) of the Act, 21 U.S.C.section 360bbb-3(b)(1), unless the authorization is terminated  or revoked sooner.       Influenza A by PCR NEGATIVE NEGATIVE   Influenza B by PCR NEGATIVE NEGATIVE    Comment: (NOTE) The Xpert Xpress SARS-CoV-2/FLU/RSV plus  assay is intended as an aid in the diagnosis of influenza from Nasopharyngeal swab specimens and should not be used as a sole basis for treatment. Nasal washings and aspirates are unacceptable for Xpert Xpress SARS-CoV-2/FLU/RSV testing.  Fact Sheet for Patients: EntrepreneurPulse.com.au  Fact Sheet for Healthcare Providers: IncredibleEmployment.be  This test is not yet approved or cleared by the [place] FDA and has been authorized for detection and/or diagnosis of SARS-CoV-2 by FDA under an Emergency Use Authorization (EUA). This EUA will remain in effect (meaning this test can be used) for the duration of the COVID-19  declaration under Section 564(b)(1) of the Act, 21 U.S.C. section 360bbb-3(b)(1), unless the authorization is terminated or revoked.  Performed at [facility], [address] [place], [place] [address]   Hemoglobin A1c     Status: Abnormal   Collection Time: 10/25/20  5:22 AM  Result Value Ref Range   Hgb A1c MFr Bld 8.9 (H) 4.8 - 5.6 %    Comment: (NOTE) Pre diabetes:          5.7%-6.4%  Diabetes:              >6.4%  Glycemic control for   <7.0% adults with diabetes    Mean Plasma Glucose 208.73 mg/dL    Comment: Performed at [facility] [address], [place], [place] [address]  CBC     Status: Abnormal   Collection Time: 10/25/20  5:22 AM  Result Value Ref Range   WBC 4.7 4.0 - 10.5 K/uL   RBC 3.08 (L) 4.22 - 5.81 MIL/uL   Hemoglobin 9.6 (L) 13.0 - 17.0 g/dL   HCT 28.5 (L) 39 - 52 %   MCV 92.5 80.0 - 100.0 fL   MCH 31.2 26.0 - 34.0 pg   MCHC 33.7 30.0 - 36.0 g/dL   RDW 15.1 11.5 - 15.5 %   Platelets 155 150 - 400 K/uL   nRBC 0.0 0.0 - 0.2 %    Comment: Performed at [facility], [address] [place], [place] [address]  Creatinine, serum     Status: None   Collection Time: 10/25/20  5:22 AM  Result Value Ref Range   Creatinine, Ser 0.81 0.61 - 1.24 mg/dL   GFR, Estimated >60 >60 mL/min    Comment: (NOTE) Calculated using the CKD-EPI Creatinine Equation (2021) Performed at [facility], [address] [place],  [address]   Glucose, capillary     Status: Abnormal   Collection Time: 10/25/20  9:21 AM  Result Value Ref Range   Glucose-Capillary 119 (H) 70 - 99 mg/dL    Comment: Glucose reference range applies only to samples taken after fasting for at least 8 hours.  Glucose, capillary     Status: Abnormal   Collection Time: 10/25/20 11:41 AM  Result Value Ref Range   Glucose-Capillary 122 (H) 70 - 99 mg/dL    Comment: Glucose reference range applies only to samples taken after fasting for at least 8 hours.    Comment 1 Notify RN   Glucose, capillary     Status: Abnormal   Collection Time: 10/25/20  4:42 PM  Result Value Ref Range   Glucose-Capillary 121 (H) 70 - 99 mg/dL    Comment: Glucose reference range applies only to samples taken after fasting for at least 8 hours.   Comment 1 Notify RN   Glucose, capillary     Status: Abnormal   Collection Time: 10/25/20  9:43 PM  Result Value Ref Range   Glucose-Capillary 107 (H) 70 - 99 mg/dL    Comment: Glucose reference range applies only to samples taken after fasting for at least 8 hours.  CBC     Status: Abnormal   Collection Time: 10/26/20  6:08 AM  Result Value Ref Range   WBC 4.3 4.0 - 10.5 K/uL   RBC 2.75 (L) 4.22 - 5.81 MIL/uL   Hemoglobin 8.5 (L) 13.0 - 17.0 g/dL   HCT 25.6 (L) 39 - 52 %   MCV 93.1 80.0 - 100.0 fL   MCH 30.9 26.0 - 34.0 pg   MCHC 33.2 30.0 - 36.0 g/dL   RDW 15.0 11.5 - 15.5 %   Platelets 145 (L) 150 - 400 K/uL   nRBC 0.0 0.0 - 0.2 %    Comment: Performed at [facility], [address] [place], [place] [address]  Comprehensive metabolic panel     Status: Abnormal   Collection Time: 10/26/20  6:08 AM  Result Value Ref Range   Sodium 136 135 - 145 mmol/L   Potassium 3.5 3.5 - 5.1 mmol/L   Chloride 103 98 - 111 mmol/L   CO2 24 22 - 32 mmol/L   Glucose, Bld 103 (H) 70 - 99 mg/dL    Comment: Glucose reference range applies only to samples taken after fasting for at least 8 hours.   BUN 13 8 - 23 mg/dL   Creatinine, Ser 0.91 0.61 - 1.24 mg/dL   Calcium 7.3 (L) 8.9 - 10.3 mg/dL   Total Protein 5.6 (L) 6.5 - 8.1 g/dL   Albumin 3.0 (L) 3.5 - 5.0 g/dL   AST 16 15 - 41 U/L   ALT 16 0 - 44 U/L   Alkaline Phosphatase 65 38 - 126 U/L   Total Bilirubin 0.9 0.3 - 1.2 mg/dL   GFR, Estimated >60 >60 mL/min    Comment: (NOTE) Calculated using the CKD-EPI Creatinine Equation (2021)    Anion gap 9 5 - 15    Comment: Performed at [facility], [address] [place], [place] [address]  Magnesium      Status: Abnormal   Collection Time: 10/26/20  6:08 AM  Result Value Ref Range   Magnesium 1.2 (L) 1.7 - 2.4 mg/dL    Comment: Performed at [facility], [address] [place],  [address]  Glucose, capillary     Status: None   Collection Time: 10/26/20  7:30 AM  Result Value Ref Range   Glucose-Capillary 92 70 - 99 mg/dL    Comment: Glucose reference range applies only to samples taken after fasting for at least 8 hours.  Glucose, capillary     Status: None   Collection Time: 10/26/20 11:45 AM  Result Value Ref Range   Glucose-Capillary 95 70 - 99 mg/dL    Comment: Glucose reference range applies only to samples taken after fasting for at least 8 hours.  Glucose, capillary     Status: None   Collection Time: 10/26/20  4:25 PM  Result Value Ref Range   Glucose-Capillary 85 70 - 99 mg/dL    Comment: Glucose reference range applies only to samples taken after fasting for at least 8 hours.   DG Chest 2 View  Result Date: 10/24/2020 CLINICAL DATA:  Chest pain, burning in throat. Chemo radiation therapy. EXAM: CHEST - 2 VIEW COMPARISON:  Chest x-ray 10/23/2020, PET CT 08/18/2020 FINDINGS: Right chest wall Port-A-Cath with tip overlying the expected region of the  superior cavoatrial junction. The heart size and mediastinal contours are within normal limits. Aortic arch calcifications. Subcentimeter calcified left upper lobe granuloma again noted but not as conspicuous due to overlying vasculature. No focal consolidation. No pulmonary edema. No pleural effusion. No pneumothorax. No acute osseous abnormality. Multilevel degenerative changes of the spine. IMPRESSION: No active cardiopulmonary disease. Electronically Signed   By: Iven Finn M.D.   On: 10/24/2020 21:30    Assessment:  The patient is a 74 y.o. gentleman with stage III nasopharyngeal carcinoma.  He is s/p 6 weeks of concurrent radiation and weekly cisplatin.  PET scan on 08/18/2020 revealed a 5.7 x 4.2 cm  destructive hypermetabolic mass (SUV 87.68) involving the sphenoid sinus, skull base and posterior nasopharynx.  This was consistent with patient's known squamous cell carcinoma. There was no locoregional lymphadenopathy or distant metastatic disease.  He has mucositis involving the soft and hard palate.  He notes pain control for 1 hour after pain medications.    Plan:   1.   Stage III nasopharyngeal carcinoma  Patient is s/p 6 weeks of concurrent chemotherapy and weekly cisplatin.  Patient last received chemotherapy on 10/20/2020 and radiation on 10/21/2020.  Will notify Dr Baruch Gouty (radiation was to end on 10/28/2020).  2.   Mucositis  Patient on morphine 4 mg IV every 3 hours prn pain and Toradol 15 mg po q 6 hours prn pain.  He has Magic mouthwash 4 times a day and viscous lidocaine every 4 hours .  He has Norco 10-325 mg 1/2 tablet every 4 hours prn pain.  He has soft food (jello) at his beside table.  He is eating some crushed ice with some relief.  Currently he has Ensure provided 3 times a day.   In the OPD, he was drinking 4-5 Boosts or Ensures/day with 5 bottles of water a day.  He has met with nutrition.  He would like more Ensure.  Encouraged patient to use Chapstick or Carmex lip balm. 3.   Constipation  Etiology secondary to decreased oral intake and pain medications  Patient on Dulcolax and Miralax. 4.   Hypomagnesemia  Magnesium 1.2.  Patient received IV magnesium today.  Patient has required IV supplementation in OPD secondary to cisplatin induced wasting. 5.   Normocytic anemia  Hematocrit 34.8.  Hemoglobin 11.5.  MCV 91.3 on 10/20/2020.  Hematocrit 25.6.  Hemoglobin   8.5.  MCV 93.1 on 10/26/2020.  Patient denies any bleeding.  Oral intake poor.  Anemia work-up in AM.  Etiology likely multifactorial (chemotherapy induced, nutritional).    Thank you for allowing me to participate in Kenneth Franklin 's care.  I will follow him closely with you while hospitalized and  after discharge in the outpatient department.   Lequita Asal, MD  10/26/2020, 9:18 PM

## 2020-10-27 ENCOUNTER — Inpatient Hospital Stay: Payer: Medicare Other | Admitting: Hematology and Oncology

## 2020-10-27 ENCOUNTER — Ambulatory Visit: Payer: Medicare Other

## 2020-10-27 ENCOUNTER — Inpatient Hospital Stay: Payer: Medicare Other

## 2020-10-27 DIAGNOSIS — R1310 Dysphagia, unspecified: Secondary | ICD-10-CM | POA: Diagnosis not present

## 2020-10-27 DIAGNOSIS — E119 Type 2 diabetes mellitus without complications: Secondary | ICD-10-CM | POA: Diagnosis not present

## 2020-10-27 DIAGNOSIS — R531 Weakness: Secondary | ICD-10-CM | POA: Diagnosis not present

## 2020-10-27 DIAGNOSIS — F419 Anxiety disorder, unspecified: Secondary | ICD-10-CM | POA: Diagnosis not present

## 2020-10-27 LAB — BASIC METABOLIC PANEL
Anion gap: 13 (ref 5–15)
BUN: 11 mg/dL (ref 8–23)
CO2: 25 mmol/L (ref 22–32)
Calcium: 7.5 mg/dL — ABNORMAL LOW (ref 8.9–10.3)
Chloride: 100 mmol/L (ref 98–111)
Creatinine, Ser: 0.76 mg/dL (ref 0.61–1.24)
GFR, Estimated: >60 mL/min (ref >60)
Glucose, Bld: 91 mg/dL (ref 70–99)
Potassium: 3.4 mmol/L — ABNORMAL LOW (ref 3.5–5.1)
Sodium: 138 mmol/L (ref 135–145)

## 2020-10-27 LAB — GLUCOSE, CAPILLARY
Glucose-Capillary: 81 mg/dL (ref 70–99)
Glucose-Capillary: 109 mg/dL — ABNORMAL HIGH (ref 70–99)
Glucose-Capillary: 116 mg/dL — ABNORMAL HIGH (ref 70–99)

## 2020-10-27 LAB — CBC
HCT: 30.6 % — ABNORMAL LOW (ref 39.0–52.0)
Hemoglobin: 10.3 g/dL — ABNORMAL LOW (ref 13.0–17.0)
MCH: 31.1 pg (ref 26.0–34.0)
MCHC: 33.7 g/dL (ref 30.0–36.0)
MCV: 92.4 fL (ref 80.0–100.0)
Platelets: 190 10*3/uL (ref 150–400)
RBC: 3.31 MIL/uL — ABNORMAL LOW (ref 4.22–5.81)
RDW: 15 % (ref 11.5–15.5)
WBC: 4.4 10*3/uL (ref 4.0–10.5)
nRBC: 0 % (ref 0.0–0.2)

## 2020-10-27 LAB — IRON AND TIBC
Iron: 43 ug/dL — ABNORMAL LOW (ref 45–182)
Saturation Ratios: 19 % (ref 17.9–39.5)
TIBC: 221 ug/dL — ABNORMAL LOW (ref 250–450)
UIBC: 178 ug/dL

## 2020-10-27 LAB — MAGNESIUM: Magnesium: 1.7 mg/dL (ref 1.7–2.4)

## 2020-10-27 LAB — VITAMIN B12: Vitamin B-12: 242 pg/mL (ref 180–914)

## 2020-10-27 LAB — FERRITIN: Ferritin: 471 ng/mL — ABNORMAL HIGH (ref 24–336)

## 2020-10-27 LAB — FOLATE: Folate: 13.1 ng/mL (ref >5.9)

## 2020-10-27 MED ORDER — MAGNESIUM SULFATE 2 GM/50ML IV SOLN
2.0000 g | Freq: Once | INTRAVENOUS | Status: AC
  Administered 2020-10-27: 2 g via INTRAVENOUS
  Filled 2020-10-27: qty 50

## 2020-10-27 MED ORDER — POTASSIUM CHLORIDE 2 MEQ/ML IV SOLN
INTRAVENOUS | Status: DC
  Filled 2020-10-27 (×16): qty 1000

## 2020-10-27 MED ORDER — KCL-LACTATED RINGERS 20 MEQ/L IV SOLN
INTRAVENOUS | Status: DC
  Filled 2020-10-27 (×4): qty 1000

## 2020-10-27 MED ORDER — SUCRALFATE 1 G PO TABS
1.0000 g | ORAL_TABLET | Freq: Four times a day (QID) | ORAL | Status: DC
  Administered 2020-10-27 – 2020-11-01 (×15): 1 g via ORAL
  Filled 2020-10-27 (×16): qty 1

## 2020-10-27 MED ORDER — SODIUM FLUORIDE 1.1 (0.5 F) MG/ML PO SOLN: 1.0000 [drp] | Freq: Every day | ORAL | Status: DC

## 2020-10-27 NOTE — TOC Initial Note (Signed)
Transition of Care [facility]) - Initial/Assessment Note    Patient Details  Name: Kenneth Franklin MRN: [id number] Date of Birth: [birth year]  Transition of Care [facility]) CM/SW Contact:    Magnus Ivan, LCSW Phone Number: 10/27/2020, 1:29 PM  Clinical Narrative:                CSW spoke to patient for Readmission Screening. Patient lives alone, his brother provides transportation to appointments. PCP is Verita Lamb. [facility]. No DME, HH, or SNF history. Patient denied any home needs at this time.   Expected Discharge Plan: Home/Self Care Barriers to Discharge: Continued Medical Work up   Patient Goals and CMS Choice Patient states their goals for this hospitalization and ongoing recovery are:: to return home CMS Medicare.gov Compare Post Acute Care list provided to:: Patient Choice offered to / list presented to : Patient  Expected Discharge Plan and Services Expected Discharge Plan: Home/Self Care       Living arrangements for the past 2 months: Single Family Home                                      Prior Living Arrangements/Services Living arrangements for the past 2 months: Single Family Home Lives with:: Self   Do you feel safe going back to the place where you live?: Yes      Need for Family Participation in Patient Care: Yes (Comment) Care giver support system in place?: Yes (comment)   Criminal Activity/Legal Involvement Pertinent to Current Situation/Hospitalization: No - Comment as needed  Activities of Daily Living Home Assistive Devices/Equipment: None ADL Screening (condition at time of admission) Patient's cognitive ability adequate to safely complete daily activities?: Yes Is the patient deaf or have difficulty hearing?: No Does the patient have difficulty seeing, even when wearing glasses/contacts?: No Does the patient have difficulty concentrating, remembering, or making decisions?: No Patient able to express need for assistance with  ADLs?: Yes Does the patient have difficulty dressing or bathing?: No Independently performs ADLs?: Yes (appropriate for developmental age) Does the patient have difficulty walking or climbing stairs?: No Weakness of Legs: Both Weakness of Arms/Hands: None  Permission Sought/Granted                  Emotional Assessment       Orientation: : Oriented to Self, Oriented to Place, Oriented to  Time, Oriented to Situation Alcohol / Substance Use: Not Applicable Psych Involvement: No (comment)  Admission diagnosis:  Dehydration [E86.0] Weakness generalized [R53.1] Mucositis [K12.30] 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 Malnutrition of moderate degree 10/26/2020  . Painful swallowing 10/25/2020  . Inadequate oral intake 10/25/2020  . Mucositis due to antineoplastic therapy 10/25/2020  . Mucositis due to radiation therapy 10/25/2020  . Odynophagia 10/25/2020  . Dehydration 10/22/2020  . Nausea without vomiting 10/22/2020  . Poor appetite 10/22/2020  . Goals of care, counseling/discussion 09/21/2020  . Hypokalemia 09/15/2020  . 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 Encounter for antineoplastic chemotherapy 09/15/2020  . Hypomagnesemia 09/08/2020  . 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 Chronic obstructive pulmonary disease with acute exacerbation ([place]) 10/01/2019  . Severe recurrent major depression without psychotic features ([place]) 06/01/2017  . Alcohol abuse 06/01/2017  . Tylenol overdose 05/31/2017  . Erroneous encounter - disregard 08/02/2016  . Acute ischemic stroke ([place]) 07/07/2016  .  Tobacco use disorder 06/28/2016  . Tick bite of flank 06/16/2016  . Grief counseling 06/16/2016  . Generalized weakness 06/16/2016  . Absent pulse in lower extremity 09/14/2015  . Anxiety and depression 04/21/2015  . Diabetes mellitus type 2, controlled, without complications ([place]) 27/04/2375  .  Acid reflux 04/21/2015  . Calcium blood increased 04/21/2015  . Dyslipidemia 04/21/2015  . HLD (hyperlipidemia) 04/21/2015  . BP (high blood pressure) 04/21/2015   PCP:  Verita Lamb, NP Pharmacy:   [id number] [place], [address] [place] [address] Phone: [phone] Fax: [phone]     Social Determinants of Health (SDOH) Interventions    Readmission Risk Interventions Readmission Risk Prevention Plan 10/27/2020  Transportation Screening Complete  PCP or Specialist Appt within 3-5 Days Complete  HRI or [place] Complete  Social Work Consult for [place] Planning/Counseling Complete  Palliative Care Screening Not Applicable  Medication Review Press photographer) Complete  Some recent data might be hidden

## 2020-10-27 NOTE — Progress Notes (Addendum)
PROGRESS NOTE  Kenneth Franklin BLT:[id number] DOB: Jan 21, 1946 DOA: 10/25/2020 PCP: Verita Lamb, NP   LOS: 2 days   Brief narrative: As per HPI,  Kenneth Franklin is a 74 y.o. 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  Patient was seen by oncology as outpatient and was on Magic mouthwash with lidocaine, Carafate without much relief.  Patient had seen Dr. Mike Gip on 11/23.    Patient tried to drink several bottles of water as well as Ensure during the day to keep up with his hydration but reported feeling progressively weaker and presented to the emergency room.  He also complained of nausea but no vomiting, abdominal pain but was constipated..  In the ED, vitals were within normal limits. EKG showed sinus tachycardia.  Patient was given IV fluid bolus, Viscous Lidocaine.  Hospitalization was then requested due to persistent symptoms.  Assessment/Plan:  Principal Problem:   Painful swallowing Active Problems:   Anxiety and depression   Diabetes mellitus type 2, controlled, without complications (HCC)   Generalized weakness   Nasopharyngeal carcinoma (HCC)   Inadequate oral intake   Mucositis due to antineoplastic therapy   Mucositis due to radiation therapy   Odynophagia   Malnutrition of moderate degree  Odynophagia, stomatitis, mucositis secondary to antineoplastic/radiation therapy Continue supportive care including viscous lidocaine, IV morphine.     Added Magic mouthwash yesterday and increased dose of IV morphine 4 mg every every 3 hourly.  Question radiation esophagitis.  Oncology has seen the patient.  Continue IV hydration.  Liquid diet as tolerated.  Encouraged oral hydration, crush pills when able.  Encouraged him to  drink protein supplements and maintain hydration.  Seen by radiation oncology today and recommend no further radiation.  Added low-dose with IV Toradol as well but patient is on aspirin and Plavix.  Will need to closely monitor for any evidence of bleeding.  Nasopharyngeal carcinoma  Receiving concurrent radiation and chemotherapy.    Oncology on board.  Anxiety/ depression Continue home medications with Xanax, buspirone, Lexapro, Remeron  Diabetes mellitus type 2, controlled, without complications  Continue sitagliptin, sliding scale insulin. Latest POC glucose of 81.  Hypomagnesemia, mild hypokalemia.  We will add IV potassium to IV fluids.  Received IV magnesium sulfate yesterday.  Magnesium of 1.7 today  Inadequate oral intake/ moderate protein calorie malnutrition. Present on admission.  Nutrition on board..  Recommend nutritional supplements.  Generalized weakness . Will obtain PT evaluation.  DVT prophylaxis: enoxaparin (LOVENOX) injection 40 mg Start: 10/25/20 1000   Code Status: Full code  Family Communication: None today, spoke with the patient's brother on the phone and updated him about the clinical condition of the patient yesterday.   Status is: Inpatient  Remains inpatient appropriate because:IV treatments appropriate due to intensity of illness or inability to take PO, Inpatient level of care appropriate due to severity of illness and Mucositis poor oral intake odynophagia IV fluids and IV narcotics   Dispo: The patient is from: Home              Anticipated d/c is to: Home              Anticipated d/c date is: 2 days or so when clinically better.  Patient currently is not medically stable to d/c.  Consultants:  Oncology  Radiation oncology  Procedures:  None  Antibiotics:  . None  Anti-infectives (From admission, onward)   None     Subjective: Today, seen and examined at bedside.  He feels very frustrated that his symptoms  are persisting and has not been able to eat due to severe pain in the oral cavity.  He states that he fears eating anything..  Narcotic dose was increased yesterday.  States that he has not eaten in several days.  Objective: Vitals:   10/27/20 0324 10/27/20 0850  BP: (!) 106/52 (!) 146/81  Pulse: 69 84  Resp: 19 (!) 26  Temp: 98.4 F (36.9 C) 99.4 F (37.4 C)  SpO2: 96% 99%    Intake/Output Summary (Last 24 hours) at 10/27/2020 0929 Last data filed at 10/27/2020 0324 Gross per 24 hour  Intake 413 ml  Output 0 ml  Net 413 ml   Filed Weights   10/24/20 2040 10/26/20 1622  Weight: 75.3 kg 74.5 kg   Body mass index is 22.28 kg/m.   Physical Exam: GENERAL: Patient is alert awake and oriented.  Thinly built, mildly anxious and frustrated.  Dry oral mucosa. HENT: No scleral pallor or icterus. Pupils equally reactive to light.  Erythematous area with superficial denudation over the palate region. NECK: is supple, no gross swelling noted. CHEST: Clear to auscultation. No crackles or wheezes.  Diminished breath sounds bilaterally. CVS: S1 and S2 heard, no murmur. Regular rate and rhythm.  ABDOMEN: Soft, non-tender, bowel sounds are present. EXTREMITIES: No edema. CNS: Cranial nerves are intact. No focal motor deficits. SKIN: warm and dry without rashes.  Data Review: I have personally reviewed the following laboratory data and studies,  CBC: Recent Labs  Lab 10/23/20 1631 10/24/20 2102 10/25/20 0522 10/26/20 0608 10/27/20 0625  WBC 6.1 5.6 4.7 4.3 4.4  HGB 11.6* 10.6* 9.6* 8.5* 10.3*  HCT 34.0* 31.3* 28.5* 25.6* 30.6*  MCV 91.6 91.8 92.5 93.1 92.4  PLT 221 205 155 145* 431   Basic Metabolic Panel: Recent Labs  Lab 10/23/20 1631 10/23/20 1750 10/24/20 2102 10/25/20 0522 10/26/20 0608 10/27/20 0625  NA 133*  --  135  --  136 138  K 4.2  --  3.6  --  3.5 3.4*  CL 96*  --  98  --  103 100  CO2 25  --  25  --  24 25  GLUCOSE 164*  --  145*  --  103* 91  BUN 25*   --  20  --  13 11  CREATININE 0.93  --  0.86 0.81 0.91 0.76  CALCIUM 9.5  --  8.6*  --  7.3* 7.5*  MG  --  1.5*  --   --  1.2* 1.7  PHOS  --  2.7  --   --   --   --    Liver Function Tests: Recent Labs  Lab 10/23/20 1631 10/25/20 0247 10/26/20 0608  AST 24 26 16   ALT 24 23 16   ALKPHOS 85 81 65  BILITOT 1.0 0.7 0.9  PROT 7.3 7.6 5.6*  ALBUMIN 4.0 3.9 3.0*   Recent Labs  Lab 10/23/20 1631 10/25/20 0247  LIPASE 16 18   No results for input(s): AMMONIA in the last 168 hours. Cardiac Enzymes: No results for input(s): CKTOTAL, CKMB, CKMBINDEX, TROPONINI in the last 168 hours. BNP (last 3 results) No results for input(s): BNP in the last 8760 hours.  ProBNP (last 3 results) No results for input(s): PROBNP in the last 8760 hours.  CBG: Recent Labs  Lab 10/26/20 0730 10/26/20 1145 10/26/20 1625 10/26/20 2131 10/27/20 0739  GLUCAP 92 95 85 87 81   Recent Results (from the past 240 hour(s))  Resp Panel by RT-PCR (Flu A&B, Covid) Nasopharyngeal Swab     Status: None   Collection Time: 10/25/20  4:07 AM   Specimen: Nasopharyngeal Swab; Nasopharyngeal(NP) swabs in vial transport medium  Result Value Ref Range Status   SARS Coronavirus 2 by RT PCR NEGATIVE NEGATIVE Final    Comment: (NOTE) SARS-CoV-2 target nucleic acids are NOT DETECTED.  The SARS-CoV-2 RNA is generally detectable in upper respiratory specimens during the acute phase of infection. The lowest concentration of SARS-CoV-2 viral copies this assay can detect is 138 copies/mL. A negative result does not preclude SARS-Cov-2 infection and should not be used as the sole basis for treatment or other patient management decisions. A negative result may occur with  improper specimen collection/handling, submission of specimen other than nasopharyngeal swab, presence of viral mutation(s) within the areas targeted by this assay, and inadequate number of viral copies(<138 copies/mL). A negative result must be combined  with clinical observations, patient history, and epidemiological information. The expected result is Negative.  Fact Sheet for Patients:  EntrepreneurPulse.com.au  Fact Sheet for Healthcare Providers:  IncredibleEmployment.be  This test is no t yet approved or cleared by the [place] FDA and  has been authorized for detection and/or diagnosis of SARS-CoV-2 by FDA under an Emergency Use Authorization (EUA). This EUA will remain  in effect (meaning this test can be used) for the duration of the COVID-19 declaration under Section 564(b)(1) of the Act, 21 U.S.C.section 360bbb-3(b)(1), unless the authorization is terminated  or revoked sooner.       Influenza A by PCR NEGATIVE NEGATIVE Final   Influenza B by PCR NEGATIVE NEGATIVE Final    Comment: (NOTE) The Xpert Xpress SARS-CoV-2/FLU/RSV plus assay is intended as an aid in the diagnosis of influenza from Nasopharyngeal swab specimens and should not be used as a sole basis for treatment. Nasal washings and aspirates are unacceptable for Xpert Xpress SARS-CoV-2/FLU/RSV testing.  Fact Sheet for Patients: EntrepreneurPulse.com.au  Fact Sheet for Healthcare Providers: IncredibleEmployment.be  This test is not yet approved or cleared by the [place] FDA and has been authorized for detection and/or diagnosis of SARS-CoV-2 by FDA under an Emergency Use Authorization (EUA). This EUA will remain in effect (meaning this test can be used) for the duration of the COVID-19 declaration under Section 564(b)(1) of the Act, 21 U.S.C. section 360bbb-3(b)(1), unless the authorization is terminated or revoked.  Performed at [facility], [address] [place], [place] [address]      Studies: No results found.    Flora Lipps, MD  [facility] 10/27/2020

## 2020-10-27 NOTE — Progress Notes (Signed)
Radiation Oncology Progress note  Name: Kenneth Franklin   Date:   10/24/2020 MRN:  [id number] DOB: 1946-05-12    This 74 y.o. male who is seen in the hospital for dysphagia despite narcotics Magic mouthwash and Carafate  REFERRING PROVIDER: No ref. provider found  HPI: Patient is a 74 year old male who is 3 treatments short of completing IMRT radiation therapy along with chemotherapy for locally advanced nasopharyngeal carcinoma.  He had a 5.7 x 4.2 cm struct of hypermetabolic mass involving the left sphenoid sinus skull base and posterior nasopharynx.  He had trouble throughout treatments with dysphagia although he has had no obvious oral of mucositis.  Our treatment plan calls for sparing of his oral mucosa [name] as well as his esophagus and trachea although he has had persistent dysphagia throughout treatment.  He is seen today in his hospital room looks frail.Marland Kitchen  He continues to swallow mostly Ensure and liquids.  COMPLICATIONS OF TREATMENT: present significant dysphagia  FOLLOW UP COMPLIANCE: keeps appointments   PHYSICAL EXAM:  BP 122/70 (BP Location: Left Arm)   Pulse 94   Temp 98.7 F (37.1 C) (Oral)   Resp 20   Ht 6' (1.829 m)   Wt 164 lb 4.8 oz (74.5 kg)   SpO2 95%   BMI 22.28 kg/m  Frail-appearing male in NAD.  Oral cavity shows minimal oral mucositis.  No evidence of candidiasis.  Well-developed well-nourished patient in NAD. HEENT reveals PERLA, EOMI, discs not visualized.  Oral cavity is clear. No oral mucosal lesions are identified. Neck is clear without evidence of cervical or supraclavicular adenopathy. Lungs are clear to A&P. Cardiac examination is essentially unremarkable with regular rate and rhythm without murmur rub or thrill. Abdomen is benign with no organomegaly or masses noted. Motor sensory and DTR levels are equal and symmetric in the upper and lower extremities. Cranial nerves II through XII are grossly intact. Proprioception is intact. No peripheral adenopathy  or edema is identified. No motor or sensory levels are noted. Crude visual fields are within normal range.  RADIOLOGY RESULTS: No current films to review  PLAN: At this point I am discontinuing radiation therapy since he also has 3 fractions left.  Still not sure of the etiology do not think this is significant radiation esophagitis as we have spared his esophagus and trachea through our treatment planning process.  I will see him back in 1 month for follow-up.  Plans for discharge and follow-up care are being arranged.  Patient knows to call with any concerns.    Noreene Filbert, MD

## 2020-10-28 ENCOUNTER — Ambulatory Visit: Payer: Medicare Other

## 2020-10-28 DIAGNOSIS — E119 Type 2 diabetes mellitus without complications: Secondary | ICD-10-CM | POA: Diagnosis not present

## 2020-10-28 DIAGNOSIS — R1310 Dysphagia, unspecified: Secondary | ICD-10-CM | POA: Diagnosis not present

## 2020-10-28 DIAGNOSIS — R531 Weakness: Secondary | ICD-10-CM | POA: Diagnosis not present

## 2020-10-28 DIAGNOSIS — F419 Anxiety disorder, unspecified: Secondary | ICD-10-CM | POA: Diagnosis not present

## 2020-10-28 LAB — GLUCOSE, CAPILLARY
Glucose-Capillary: 115 mg/dL — ABNORMAL HIGH (ref 70–99)
Glucose-Capillary: 97 mg/dL (ref 70–99)
Glucose-Capillary: 120 mg/dL — ABNORMAL HIGH (ref 70–99)
Glucose-Capillary: 143 mg/dL — ABNORMAL HIGH (ref 70–99)
Glucose-Capillary: 114 mg/dL — ABNORMAL HIGH (ref 70–99)

## 2020-10-28 LAB — COMPREHENSIVE METABOLIC PANEL
ALT: 12 U/L (ref 0–44)
AST: 15 U/L (ref 15–41)
Albumin: 2.7 g/dL — ABNORMAL LOW (ref 3.5–5.0)
Alkaline Phosphatase: 63 U/L (ref 38–126)
Anion gap: 11 (ref 5–15)
BUN: 9 mg/dL (ref 8–23)
CO2: 26 mmol/L (ref 22–32)
Calcium: 6.9 mg/dL — ABNORMAL LOW (ref 8.9–10.3)
Chloride: 97 mmol/L — ABNORMAL LOW (ref 98–111)
Creatinine, Ser: 0.61 mg/dL (ref 0.61–1.24)
GFR, Estimated: >60 mL/min (ref >60)
Glucose, Bld: 96 mg/dL (ref 70–99)
Potassium: 3 mmol/L — ABNORMAL LOW (ref 3.5–5.1)
Sodium: 134 mmol/L — ABNORMAL LOW (ref 135–145)
Total Bilirubin: 0.9 mg/dL (ref 0.3–1.2)
Total Protein: 5.4 g/dL — ABNORMAL LOW (ref 6.5–8.1)

## 2020-10-28 LAB — CBC
HCT: 25.9 % — ABNORMAL LOW (ref 39.0–52.0)
Hemoglobin: 8.8 g/dL — ABNORMAL LOW (ref 13.0–17.0)
MCH: 31.5 pg (ref 26.0–34.0)
MCHC: 34 g/dL (ref 30.0–36.0)
MCV: 92.8 fL (ref 80.0–100.0)
Platelets: 141 10*3/uL — ABNORMAL LOW (ref 150–400)
RBC: 2.79 MIL/uL — ABNORMAL LOW (ref 4.22–5.81)
RDW: 15.1 % (ref 11.5–15.5)
WBC: 6.3 10*3/uL (ref 4.0–10.5)
nRBC: 0 % (ref 0.0–0.2)

## 2020-10-28 LAB — PHOSPHORUS: Phosphorus: 1.2 mg/dL — ABNORMAL LOW (ref 2.5–4.6)

## 2020-10-28 LAB — MAGNESIUM: Magnesium: 1.5 mg/dL — ABNORMAL LOW (ref 1.7–2.4)

## 2020-10-28 MED ORDER — BUDESONIDE 0.25 MG/2ML IN SUSP: 0.2500 mg | Freq: Two times a day (BID) | RESPIRATORY_TRACT | Status: DC | PRN

## 2020-10-28 MED ORDER — POTASSIUM CHLORIDE 10 MEQ/100ML IV SOLN
10.0000 meq | INTRAVENOUS | Status: DC
Start: 2020-10-28 — End: 2020-10-28

## 2020-10-28 MED ORDER — MAGNESIUM SULFATE 2 GM/50ML IV SOLN
2.0000 g | Freq: Once | INTRAVENOUS | Status: AC
  Administered 2020-10-28: 2 g via INTRAVENOUS
  Filled 2020-10-28: qty 50

## 2020-10-28 MED ORDER — POTASSIUM PHOSPHATES 15 MMOLE/5ML IV SOLN
30.0000 mmol | Freq: Once | INTRAVENOUS | Status: AC
  Administered 2020-10-28: 30 mmol via INTRAVENOUS
  Filled 2020-10-28: qty 10

## 2020-10-28 MED ORDER — SALINE SPRAY 0.65 % NA SOLN
1.0000 | NASAL | Status: DC | PRN
  Administered 2020-10-28: 1 via NASAL
  Filled 2020-10-28 (×2): qty 44

## 2020-10-28 NOTE — Plan of Care (Signed)
°  Problem: Coping: °Goal: Level of anxiety will decrease °Outcome: Progressing °  °

## 2020-10-28 NOTE — Care Management Important Message (Signed)
Important Message  Patient Details  Name: Kenneth Franklin MRN: [id number] Date of Birth: 1946-08-26   Medicare Important Message Given:  Yes     Juliann Pulse A Zikeria Keough 10/28/2020, 11:08 AM

## 2020-10-28 NOTE — Progress Notes (Signed)
PROGRESS NOTE  Kenneth Franklin ZCH:[id number] DOB: 1946-03-09 DOA: 10/25/2020 PCP: Verita Lamb, NP   LOS: 3 days   Brief narrative: As per HPI,  Kenneth Franklin is a 74 y.o. 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  Patient was seen by oncology as outpatient and was on Magic mouthwash with lidocaine, Carafate without much relief.  Patient had seen Dr. Mike Gip on 11/23.    Due to progressive pain and weakness patient presented to the emergency room.  He also complained of nausea but no vomiting, abdominal pain but was constipated..  In the ED, vitals were within normal limits. EKG showed sinus tachycardia.  Patient was given IV fluid bolus, Viscous Lidocaine.  Hospitalization was then requested due to persistent symptoms.  Assessment/Plan:  Principal Problem:   Painful swallowing Active Problems:   Anxiety and depression   Diabetes mellitus type 2, controlled, without complications (HCC)   Generalized weakness   Nasopharyngeal carcinoma (HCC)   Inadequate oral intake   Mucositis due to antineoplastic therapy   Mucositis due to radiation therapy   Odynophagia   Malnutrition of moderate degree  Odynophagia, stomatitis, mucositis secondary to antineoplastic/radiation therapy Continue supportive care including viscous lidocaine, IV morphine.     Continue Magic mouthwash and IV morphine. Question radiation esophagitis.  Oncology and radiation oncology has seen the patient.  Patient has been encouraged hydration and nutrition.  Patient is also IV Toradol as well but patient is on aspirin and Plavix.  Will need to closely monitor for any evidence of bleeding.  Renal function is stable.  Hypokalemia continue to replenish IV.  Check  levels in a.m.  Hypophosphatemia.  Replenished with potassium phosphate IV.  Check levels in a.m.  Hypomagnesemia.  Will give 2 g of IV magnesium sulfate repeat as necessary  Nasopharyngeal carcinoma  Receiving concurrent radiation and chemotherapy.    Radiation oncology, medical oncology on board  Anxiety/ depression Continue home medications with Xanax, buspirone, Lexapro, Remeron  Diabetes mellitus type 2, controlled, without complications  Continue sitagliptin, sliding scale insulin.  Closely monitor glucose levels.  Inadequate oral intake/ moderate protein calorie malnutrition. Present on admission.  Nutrition on board..  Recommend nutritional supplements.  Generalized weakness .  Physical therapy evaluation pending.  DVT prophylaxis: enoxaparin (LOVENOX) injection 40 mg Start: 10/25/20 1000   Code Status: Full code  Family Communication:  None today  Status is: Inpatient  Remains inpatient appropriate because:IV treatments appropriate due to intensity of illness or inability to take PO, Inpatient level of care appropriate due to severity of illness and Mucositis poor oral intake odynophagia IV fluids and IV narcotics,multiple electrolyte imbalances   Dispo: The patient is from: Home              Anticipated d/c is to: Home              Anticipated d/c date is: 2 days or so when clinically better.              Patient currently is not medically stable to d/c.  Due to multiple electrolyte imbalances, IV narcotics for pain.  Consultants:  Oncology  Radiation oncology  Procedures:  None  Antibiotics:  . None  Anti-infectives (From admission, onward)   None     Subjective: Today,  patient was seen and examined at bedside.  Patient still complains of sore mouth and pain trying to eat food. Has not had much oral intake.  Denies any chest pain, shortness of breath, fever or chills.  Objective: Vitals:   10/28/20 0020 10/28/20 0426  BP: (!) 147/67  (!) 116/46  Pulse: 78 72  Resp: 17 17  Temp: 98.6 F (37 C) 98.5 F (36.9 C)  SpO2: 98% 97%    Intake/Output Summary (Last 24 hours) at 10/28/2020 0733 Last data filed at 10/28/2020 0654 Gross per 24 hour  Intake --  Output 425 ml  Net -425 ml   Filed Weights   10/24/20 2040 10/26/20 1622  Weight: 75.3 kg 74.5 kg   Body mass index is 22.28 kg/m.   Physical Exam: GENERAL: Patient is alert awake and oriented.  Thinly built, mildly anxious and frustrated.  Dry oral mucosa. HENT: No scleral pallor or icterus. Pupils equally reactive to light.  Erythematous area with superficial denudation over the palate region. NECK: is supple, no gross swelling noted. CHEST: Clear to auscultation. No crackles or wheezes.  Diminished breath sounds bilaterally. CVS: S1 and S2 heard, no murmur. Regular rate and rhythm.  ABDOMEN: Soft, non-tender, bowel sounds are present. EXTREMITIES: No edema. CNS: Cranial nerves are intact. No focal motor deficits. SKIN: warm and dry without rashes.  Data Review: I have personally reviewed the following laboratory data and studies,  CBC: Recent Labs  Lab 10/24/20 2102 10/25/20 0522 10/26/20 0608 10/27/20 0625 10/28/20 0420  WBC 5.6 4.7 4.3 4.4 6.3  HGB 10.6* 9.6* 8.5* 10.3* 8.8*  HCT 31.3* 28.5* 25.6* 30.6* 25.9*  MCV 91.8 92.5 93.1 92.4 92.8  PLT 205 155 145* 190 462*   Basic Metabolic Panel: Recent Labs  Lab 10/23/20 1631 10/23/20 1631 10/23/20 1750 10/24/20 2102 10/25/20 0522 10/26/20 0608 10/27/20 0625 10/28/20 0420  NA 133*  --   --  135  --  136 138 134*  K 4.2  --   --  3.6  --  3.5 3.4* 3.0*  CL 96*  --   --  98  --  103 100 97*  CO2 25  --   --  25  --  24 25 26   GLUCOSE 164*  --   --  145*  --  103* 91 96  BUN 25*  --   --  20  --  13 11 9   CREATININE 0.93   < >  --  0.86 0.81 0.91 0.76 0.61  CALCIUM 9.5  --   --  8.6*  --  7.3* 7.5* 6.9*  MG  --   --  1.5*  --   --  1.2* 1.7 1.5*  PHOS  --   --  2.7  --   --   --   --  1.2*    < > = values in this interval not displayed.   Liver Function Tests: Recent Labs  Lab 10/23/20 1631 10/25/20 0247 10/26/20 0608 10/28/20 0420  AST 24 26 16 15   ALT 24 23 16 12   ALKPHOS 85 81 65 63  BILITOT 1.0 0.7 0.9 0.9  PROT 7.3 7.6 5.6* 5.4*  ALBUMIN 4.0 3.9 3.0* 2.7*   Recent Labs  Lab 10/23/20 1631 10/25/20 0247  LIPASE 16 18   No results for input(s): AMMONIA in the last 168 hours. Cardiac Enzymes: No results for input(s): CKTOTAL, CKMB, CKMBINDEX, TROPONINI in the last 168 hours. BNP (last 3 results) No results for  input(s): BNP in the last 8760 hours.  ProBNP (last 3 results) No results for input(s): PROBNP in the last 8760 hours.  CBG: Recent Labs  Lab 10/26/20 2131 10/27/20 0739 10/27/20 1142 10/27/20 1630 10/27/20 1957  GLUCAP 87 81 109* 116* 115*   Recent Results (from the past 240 hour(s))  Resp Panel by RT-PCR (Flu A&B, Covid) Nasopharyngeal Swab     Status: None   Collection Time: 10/25/20  4:07 AM   Specimen: Nasopharyngeal Swab; Nasopharyngeal(NP) swabs in vial transport medium  Result Value Ref Range Status   SARS Coronavirus 2 by RT PCR NEGATIVE NEGATIVE Final    Comment: (NOTE) SARS-CoV-2 target nucleic acids are NOT DETECTED.  The SARS-CoV-2 RNA is generally detectable in upper respiratory specimens during the acute phase of infection. The lowest concentration of SARS-CoV-2 viral copies this assay can detect is 138 copies/mL. A negative result does not preclude SARS-Cov-2 infection and should not be used as the sole basis for treatment or other patient management decisions. A negative result may occur with  improper specimen collection/handling, submission of specimen other than nasopharyngeal swab, presence of viral mutation(s) within the areas targeted by this assay, and inadequate number of viral copies(<138 copies/mL). A negative result must be combined with clinical observations, patient history, and  epidemiological information. The expected result is Negative.  Fact Sheet for Patients:  EntrepreneurPulse.com.au  Fact Sheet for Healthcare Providers:  IncredibleEmployment.be  This test is no t yet approved or cleared by the [place] FDA and  has been authorized for detection and/or diagnosis of SARS-CoV-2 by FDA under an Emergency Use Authorization (EUA). This EUA will remain  in effect (meaning this test can be used) for the duration of the COVID-19 declaration under Section 564(b)(1) of the Act, 21 U.S.C.section 360bbb-3(b)(1), unless the authorization is terminated  or revoked sooner.       Influenza A by PCR NEGATIVE NEGATIVE Final   Influenza B by PCR NEGATIVE NEGATIVE Final    Comment: (NOTE) The Xpert Xpress SARS-CoV-2/FLU/RSV plus assay is intended as an aid in the diagnosis of influenza from Nasopharyngeal swab specimens and should not be used as a sole basis for treatment. Nasal washings and aspirates are unacceptable for Xpert Xpress SARS-CoV-2/FLU/RSV testing.  Fact Sheet for Patients: EntrepreneurPulse.com.au  Fact Sheet for Healthcare Providers: IncredibleEmployment.be  This test is not yet approved or cleared by the [place] FDA and has been authorized for detection and/or diagnosis of SARS-CoV-2 by FDA under an Emergency Use Authorization (EUA). This EUA will remain in effect (meaning this test can be used) for the duration of the COVID-19 declaration under Section 564(b)(1) of the Act, 21 U.S.C. section 360bbb-3(b)(1), unless the authorization is terminated or revoked.  Performed at [facility], [address] [place], [place] [address]      Studies: No results found.    Flora Lipps, MD  [facility] 10/28/2020

## 2020-10-29 ENCOUNTER — Ambulatory Visit: Payer: Medicare Other

## 2020-10-29 DIAGNOSIS — R1310 Dysphagia, unspecified: Secondary | ICD-10-CM | POA: Diagnosis not present

## 2020-10-29 DIAGNOSIS — E119 Type 2 diabetes mellitus without complications: Secondary | ICD-10-CM | POA: Diagnosis not present

## 2020-10-29 DIAGNOSIS — F419 Anxiety disorder, unspecified: Secondary | ICD-10-CM | POA: Diagnosis not present

## 2020-10-29 DIAGNOSIS — R531 Weakness: Secondary | ICD-10-CM | POA: Diagnosis not present

## 2020-10-29 LAB — COMPREHENSIVE METABOLIC PANEL
ALT: 14 U/L (ref 0–44)
AST: 22 U/L (ref 15–41)
Albumin: 2.7 g/dL — ABNORMAL LOW (ref 3.5–5.0)
Alkaline Phosphatase: 62 U/L (ref 38–126)
Anion gap: 10 (ref 5–15)
BUN: 7 mg/dL — ABNORMAL LOW (ref 8–23)
CO2: 26 mmol/L (ref 22–32)
Calcium: 7 mg/dL — ABNORMAL LOW (ref 8.9–10.3)
Chloride: 97 mmol/L — ABNORMAL LOW (ref 98–111)
Creatinine, Ser: 0.7 mg/dL (ref 0.61–1.24)
GFR, Estimated: >60 mL/min (ref >60)
Glucose, Bld: 116 mg/dL — ABNORMAL HIGH (ref 70–99)
Potassium: 3 mmol/L — ABNORMAL LOW (ref 3.5–5.1)
Sodium: 133 mmol/L — ABNORMAL LOW (ref 135–145)
Total Bilirubin: 1 mg/dL (ref 0.3–1.2)
Total Protein: 5.7 g/dL — ABNORMAL LOW (ref 6.5–8.1)

## 2020-10-29 LAB — CBC WITH DIFFERENTIAL/PLATELET
Abs Immature Granulocytes: 0.1 10*3/uL — ABNORMAL HIGH (ref 0.00–0.07)
Basophils Absolute: 0 10*3/uL (ref 0.0–0.1)
Basophils Relative: 0 %
Eosinophils Absolute: 0 10*3/uL (ref 0.0–0.5)
Eosinophils Relative: 0 %
HCT: 25 % — ABNORMAL LOW (ref 39.0–52.0)
Hemoglobin: 8.6 g/dL — ABNORMAL LOW (ref 13.0–17.0)
Immature Granulocytes: 2 %
Lymphocytes Relative: 11 %
Lymphs Abs: 0.6 10*3/uL — ABNORMAL LOW (ref 0.7–4.0)
MCH: 30.9 pg (ref 26.0–34.0)
MCHC: 34.4 g/dL (ref 30.0–36.0)
MCV: 89.9 fL (ref 80.0–100.0)
Monocytes Absolute: 0.5 10*3/uL (ref 0.1–1.0)
Monocytes Relative: 9 %
Neutro Abs: 4.4 10*3/uL (ref 1.7–7.7)
Neutrophils Relative %: 78 %
Platelets: 151 10*3/uL (ref 150–400)
RBC: 2.78 MIL/uL — ABNORMAL LOW (ref 4.22–5.81)
RDW: 15 % (ref 11.5–15.5)
WBC: 5.6 10*3/uL (ref 4.0–10.5)
nRBC: 0 % (ref 0.0–0.2)

## 2020-10-29 LAB — GLUCOSE, CAPILLARY
Glucose-Capillary: 109 mg/dL — ABNORMAL HIGH (ref 70–99)
Glucose-Capillary: 98 mg/dL (ref 70–99)
Glucose-Capillary: 161 mg/dL — ABNORMAL HIGH (ref 70–99)
Glucose-Capillary: 107 mg/dL — ABNORMAL HIGH (ref 70–99)

## 2020-10-29 LAB — PHOSPHORUS: Phosphorus: 1.7 mg/dL — ABNORMAL LOW (ref 2.5–4.6)

## 2020-10-29 LAB — MAGNESIUM: Magnesium: 1.3 mg/dL — ABNORMAL LOW (ref 1.7–2.4)

## 2020-10-29 MED ORDER — MAGNESIUM SULFATE 4 GM/100ML IV SOLN
4.0000 g | Freq: Once | INTRAVENOUS | Status: AC
  Administered 2020-10-29: 4 g via INTRAVENOUS
  Filled 2020-10-29: qty 100

## 2020-10-29 MED ORDER — POTASSIUM CHLORIDE 10 MEQ/100ML IV SOLN
10.0000 meq | INTRAVENOUS | Status: AC
  Administered 2020-10-29 (×4): 10 meq via INTRAVENOUS
  Filled 2020-10-29 (×4): qty 100

## 2020-10-29 MED ORDER — BISACODYL 10 MG RE SUPP: 10.0000 mg | Freq: Every day | RECTAL | Status: DC | PRN

## 2020-10-29 MED ORDER — POTASSIUM PHOSPHATES 15 MMOLE/5ML IV SOLN
30.0000 mmol | Freq: Once | INTRAVENOUS | Status: AC
  Administered 2020-10-29: 30 mmol via INTRAVENOUS
  Filled 2020-10-29: qty 10

## 2020-10-29 NOTE — Plan of Care (Signed)

## 2020-10-29 NOTE — Progress Notes (Addendum)
PROGRESS NOTE  Kenneth Franklin IRS:[id number] DOB: May 31, 1946 DOA: 10/25/2020 PCP: Verita Lamb, NP   LOS: 4 days   Brief narrative: As per HPI,  Kenneth Franklin is a 74 y.o. 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  Patient was seen by oncology as outpatient and was on Magic mouthwash with lidocaine, Carafate without much relief.  Patient had seen Dr. Mike Gip on 11/23.    Due to progressive pain and weakness patient presented to the emergency room.  He also complained of nausea but no vomiting, abdominal pain but was constipated..  In the ED, vitals were within normal limits. EKG showed sinus tachycardia.  Patient was given IV fluid bolus, Viscous Lidocaine.  Hospitalization was then requested due to persistent symptoms.  Assessment/Plan:  Principal Problem:   Painful swallowing Active Problems:   Anxiety and depression   Diabetes mellitus type 2, controlled, without complications (HCC)   Generalized weakness   Nasopharyngeal carcinoma (HCC)   Inadequate oral intake   Mucositis due to antineoplastic therapy   Mucositis due to radiation therapy   Odynophagia   Malnutrition of moderate degree  Odynophagia, stomatitis, mucositis secondary to antineoplastic/radiation therapy Continue supportive care including viscous lidocaine, IV morphine, Magic mouthwash.  Had a good historian but feels like he may have improved a little bit.  Question radiation esophagitis.  Oncology and radiation oncology has seen the patient.  We will continue to encourage oral hydration and nutrition.  He has several electrolyte abnormalities.  Still on IV Toradol as well but patient is on aspirin and Plavix.  Will need to closely monitor for any evidence of  bleeding.  Renal function is stable.  Hypokalemia continue to replenish IV.  Check levels in a.m. we will give a potassium with IV fluids, KCl 40 mEq IV and the potassium phosphate today.  Patient has not improved compared to yesterday.  We will aggressively replenished.  Hypophosphatemia.  Replenished with potassium phosphate yesterday.  Still low.  We will replenish with IV potassium phosphate due to difficulty with oral ingestion.  Hypomagnesemia.  We will give 4 g of magnesium sulfate today.  Check levels in a.m.  Nasopharyngeal carcinoma  Receiving concurrent radiation and chemotherapy.    Radiation oncology, medical oncology on board  Anxiety/ depression Continue home medications with Xanax, buspirone, Lexapro, Remeron.  Appears to be anxious at times.  Diabetes mellitus type 2, controlled, without complications  Continue sitagliptin, sliding scale insulin.  Closely monitor glucose levels.  Glycemic control adequate.  Inadequate oral intake/ moderate protein calorie malnutrition. Present on admission.  Nutrition on board..  Recommend nutritional supplements.   Diarrhea followed by constipation.  Hold MiraLAX.  Keep as needed Dulcolax.  Generalized weakness .  Physical therapy evaluation pending.  DVT prophylaxis: enoxaparin (LOVENOX) injection 40 mg Start: 10/25/20 1000  Code Status: Full code  Family Communication:  None today.  Patient states that he will inform his family  Status is: Inpatient  Remains inpatient appropriate because:IV treatments appropriate due to intensity of illness or inability to take PO, Inpatient level of care appropriate due to severity of illness and Mucositis poor oral intake odynophagia IV fluids and IV narcotics,multiple electrolyte imbalances   Dispo: The patient is from: Home  Anticipated d/c is to: Home              Anticipated d/c date is: 2 days or so when clinically better and electrolytes are better.               Patient currently is not medically stable to d/c.  Due to multiple electrolyte imbalances, IV narcotics for pain.  Consultants:  Oncology  Radiation oncology  Procedures:  None  Antibiotics:  . None  Anti-infectives (From admission, onward)   None     Subjective: Today, patient was seen and examined at bedside.  His leg is well during the night abdomen slightly better but not a good historian.  Has not been eating much.  Complains of generalized fatigue weakness and feels anxious.  Denies any shortness of breath, cough, fever or chills.  Patient stated that he had multiple episodes of diarrhea.  He was initially constipated.  Objective: Vitals:   10/29/20 0545 10/29/20 0825  BP: (!) 146/72 138/74  Pulse: 82 79  Resp: 19 18  Temp: 98.9 F (37.2 C) 98.6 F (37 C)  SpO2: 96% 97%    Intake/Output Summary (Last 24 hours) at 10/29/2020 1100 Last data filed at 10/28/2020 2100 Gross per 24 hour  Intake 0 ml  Output 1101 ml  Net -1101 ml   Filed Weights   10/24/20 2040 10/26/20 1622  Weight: 75.3 kg 74.5 kg   Body mass index is 22.28 kg/m.   Physical Exam: General: Alert awake and oriented, thinly built, anxious and frustrated.   HENT:   No scleral pallor or icterus noted. Oral mucosa is moist with the superficial ulceration over the palate region slightly improved..  Chest:  Clear breath sounds.  Diminished breath sounds bilaterally. No crackles or wheezes.  CVS: S1 &S2 heard. No murmur.  Regular rate and rhythm. Abdomen: Soft, nontender, nondistended.  Bowel sounds are heard.   Extremities: No cyanosis, clubbing or edema.  Peripheral pulses are palpable. Psych: Alert, awake and oriented, anxious mood CNS:  No cranial nerve deficits.  Power equal in all extremities.   Skin: Warm and dry.  No rashes noted.  Data Review: I have personally reviewed the following laboratory data and studies,  CBC: Recent Labs  Lab 10/25/20 0522 10/26/20 0608 10/27/20 0625  10/28/20 0420 10/29/20 0407  WBC 4.7 4.3 4.4 6.3 5.6  NEUTROABS  --   --   --   --  4.4  HGB 9.6* 8.5* 10.3* 8.8* 8.6*  HCT 28.5* 25.6* 30.6* 25.9* 25.0*  MCV 92.5 93.1 92.4 92.8 89.9  PLT 155 145* 190 141* 889   Basic Metabolic Panel: Recent Labs  Lab 10/23/20 1631 10/23/20 1750 10/24/20 2102 10/24/20 2102 10/25/20 0522 10/26/20 0608 10/27/20 0625 10/28/20 0420 10/29/20 0407  NA   < >  --  135  --   --  136 138 134* 133*  K   < >  --  3.6  --   --  3.5 3.4* 3.0* 3.0*  CL   < >  --  98  --   --  103 100 97* 97*  CO2   < >  --  25  --   --  24 25 26 26   GLUCOSE   < >  --  145*  --   --  103* 91 96 116*  BUN   < >  --  20  --   --  13 11 9  7*  CREATININE   < >  --  0.86   < > 0.81 0.91 0.76 0.61 0.70  CALCIUM   < >  --  8.6*  --   --  7.3* 7.5* 6.9* 7.0*  MG  --  1.5*  --   --   --  1.2* 1.7 1.5* 1.3*  PHOS  --  2.7  --   --   --   --   --  1.2* 1.7*   < > = values in this interval not displayed.   Liver Function Tests: Recent Labs  Lab 10/23/20 1631 10/25/20 0247 10/26/20 0608 10/28/20 0420 10/29/20 0407  AST 24 26 16 15 22   ALT 24 23 16 12 14   ALKPHOS 85 81 65 63 62  BILITOT 1.0 0.7 0.9 0.9 1.0  PROT 7.3 7.6 5.6* 5.4* 5.7*  ALBUMIN 4.0 3.9 3.0* 2.7* 2.7*   Recent Labs  Lab 10/23/20 1631 10/25/20 0247  LIPASE 16 18   No results for input(s): AMMONIA in the last 168 hours. Cardiac Enzymes: No results for input(s): CKTOTAL, CKMB, CKMBINDEX, TROPONINI in the last 168 hours. BNP (last 3 results) No results for input(s): BNP in the last 8760 hours.  ProBNP (last 3 results) No results for input(s): PROBNP in the last 8760 hours.  CBG: Recent Labs  Lab 10/28/20 0748 10/28/20 1224 10/28/20 1646 10/28/20 2045 10/29/20 0824  GLUCAP 97 120* 143* 114* 109*   Recent Results (from the past 240 hour(s))  Resp Panel by RT-PCR (Flu A&B, Covid) Nasopharyngeal Swab     Status: None   Collection Time: 10/25/20  4:07 AM   Specimen: Nasopharyngeal Swab;  Nasopharyngeal(NP) swabs in vial transport medium  Result Value Ref Range Status   SARS Coronavirus 2 by RT PCR NEGATIVE NEGATIVE Final    Comment: (NOTE) SARS-CoV-2 target nucleic acids are NOT DETECTED.  The SARS-CoV-2 RNA is generally detectable in upper respiratory specimens during the acute phase of infection. The lowest concentration of SARS-CoV-2 viral copies this assay can detect is 138 copies/mL. A negative result does not preclude SARS-Cov-2 infection and should not be used as the sole basis for treatment or other patient management decisions. A negative result may occur with  improper specimen collection/handling, submission of specimen other than nasopharyngeal swab, presence of viral mutation(s) within the areas targeted by this assay, and inadequate number of viral copies(<138 copies/mL). A negative result must be combined with clinical observations, patient history, and epidemiological information. The expected result is Negative.  Fact Sheet for Patients:  EntrepreneurPulse.com.au  Fact Sheet for Healthcare Providers:  IncredibleEmployment.be  This test is no t yet approved or cleared by the [place] FDA and  has been authorized for detection and/or diagnosis of SARS-CoV-2 by FDA under an Emergency Use Authorization (EUA). This EUA will remain  in effect (meaning this test can be used) for the duration of the COVID-19 declaration under Section 564(b)(1) of the Act, 21 U.S.C.section 360bbb-3(b)(1), unless the authorization is terminated  or revoked sooner.       Influenza A by PCR NEGATIVE NEGATIVE Final   Influenza B by PCR NEGATIVE NEGATIVE Final    Comment: (NOTE) The Xpert Xpress SARS-CoV-2/FLU/RSV plus assay is intended as an aid in the diagnosis of influenza from Nasopharyngeal swab specimens and should not be used as a sole basis for treatment. Nasal washings and aspirates are unacceptable for Xpert Xpress  SARS-CoV-2/FLU/RSV testing.  Fact Sheet for Patients: EntrepreneurPulse.com.au  Fact Sheet for Healthcare Providers: IncredibleEmployment.be  This test is not yet approved or cleared by  the [employer] and has been authorized for detection and/or diagnosis of SARS-CoV-2 by FDA under an Emergency Use Authorization (EUA). This EUA will remain in effect (meaning this test can be used) for the duration of the COVID-19 declaration under Section 564(b)(1) of the Act, 21 U.S.C. section 360bbb-3(b)(1), unless the authorization is terminated or revoked.  Performed at [facility], [address] [place], [place] [address]      Studies: No results found.    Flora Lipps, MD  [facility] 10/29/2020

## 2020-10-30 ENCOUNTER — Ambulatory Visit: Payer: Medicare Other

## 2020-10-30 DIAGNOSIS — R1310 Dysphagia, unspecified: Secondary | ICD-10-CM | POA: Diagnosis not present

## 2020-10-30 LAB — CBC WITH DIFFERENTIAL/PLATELET
Abs Immature Granulocytes: 0.04 10*3/uL (ref 0.00–0.07)
Basophils Absolute: 0 10*3/uL (ref 0.0–0.1)
Basophils Relative: 1 %
Eosinophils Absolute: 0 10*3/uL (ref 0.0–0.5)
Eosinophils Relative: 0 %
HCT: 25.9 % — ABNORMAL LOW (ref 39.0–52.0)
Hemoglobin: 8.6 g/dL — ABNORMAL LOW (ref 13.0–17.0)
Immature Granulocytes: 1 %
Lymphocytes Relative: 13 %
Lymphs Abs: 0.5 10*3/uL — ABNORMAL LOW (ref 0.7–4.0)
MCH: 30.9 pg (ref 26.0–34.0)
MCHC: 33.2 g/dL (ref 30.0–36.0)
MCV: 93.2 fL (ref 80.0–100.0)
Monocytes Absolute: 0.4 10*3/uL (ref 0.1–1.0)
Monocytes Relative: 10 %
Neutro Abs: 2.7 10*3/uL (ref 1.7–7.7)
Neutrophils Relative %: 75 %
Platelets: 138 10*3/uL — ABNORMAL LOW (ref 150–400)
RBC: 2.78 MIL/uL — ABNORMAL LOW (ref 4.22–5.81)
RDW: 15.3 % (ref 11.5–15.5)
WBC: 3.7 10*3/uL — ABNORMAL LOW (ref 4.0–10.5)
nRBC: 0 % (ref 0.0–0.2)

## 2020-10-30 LAB — COMPREHENSIVE METABOLIC PANEL
ALT: 13 U/L (ref 0–44)
AST: 20 U/L (ref 15–41)
Albumin: 2.7 g/dL — ABNORMAL LOW (ref 3.5–5.0)
Alkaline Phosphatase: 56 U/L (ref 38–126)
Anion gap: 10 (ref 5–15)
BUN: 6 mg/dL — ABNORMAL LOW (ref 8–23)
CO2: 24 mmol/L (ref 22–32)
Calcium: 7 mg/dL — ABNORMAL LOW (ref 8.9–10.3)
Chloride: 99 mmol/L (ref 98–111)
Creatinine, Ser: 0.71 mg/dL (ref 0.61–1.24)
GFR, Estimated: >60 mL/min (ref >60)
Glucose, Bld: 108 mg/dL — ABNORMAL HIGH (ref 70–99)
Potassium: 3.7 mmol/L (ref 3.5–5.1)
Sodium: 133 mmol/L — ABNORMAL LOW (ref 135–145)
Total Bilirubin: 1 mg/dL (ref 0.3–1.2)
Total Protein: 5.5 g/dL — ABNORMAL LOW (ref 6.5–8.1)

## 2020-10-30 LAB — GLUCOSE, CAPILLARY
Glucose-Capillary: 105 mg/dL — ABNORMAL HIGH (ref 70–99)
Glucose-Capillary: 117 mg/dL — ABNORMAL HIGH (ref 70–99)
Glucose-Capillary: 120 mg/dL — ABNORMAL HIGH (ref 70–99)
Glucose-Capillary: 127 mg/dL — ABNORMAL HIGH (ref 70–99)

## 2020-10-30 LAB — PHOSPHORUS: Phosphorus: 1.8 mg/dL — ABNORMAL LOW (ref 2.5–4.6)

## 2020-10-30 LAB — MAGNESIUM: Magnesium: 1.6 mg/dL — ABNORMAL LOW (ref 1.7–2.4)

## 2020-10-30 MED ORDER — POTASSIUM PHOSPHATES 15 MMOLE/5ML IV SOLN
20.0000 mmol | Freq: Once | INTRAVENOUS | Status: AC
  Administered 2020-10-30: 20 mmol via INTRAVENOUS
  Filled 2020-10-30: qty 6.67

## 2020-10-30 MED ORDER — PHENOL 1.4 % MT LIQD
1.0000 | OROMUCOSAL | Status: DC | PRN
  Administered 2020-10-30: 1 via OROMUCOSAL
  Filled 2020-10-30: qty 177

## 2020-10-30 MED ORDER — MAGNESIUM SULFATE 4 GM/100ML IV SOLN
4.0000 g | Freq: Once | INTRAVENOUS | Status: AC
  Administered 2020-10-30: 4 g via INTRAVENOUS
  Filled 2020-10-30: qty 100

## 2020-10-30 NOTE — Progress Notes (Signed)
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 intake and FTT.  Patient has been treated symptomatically with lidocaine, IV morphine and IV fluid resuscitation.   Subjective:  Patient states he feels the same, not better not worse.  He does state he is attempting to have some p.o. intake and has been successful in that.  No further nausea or vomiting.  He continues to have mouth pain as previous although much improved on pain medications.   Objective: Vitals:   10/30/20 0408 10/30/20 0747 10/30/20 1155 10/30/20 1555  BP: (!) 150/74 (!) 151/73 (!) 145/71 (!) 155/76  Pulse: 74 70 72 69  Resp: 16 18 16 20   Temp: 98.6 F (37 C) (!) 97.5 F (36.4 C) (!) 97.5 F (36.4 C) 97.9 F (36.6 C)  TempSrc: Oral Oral Oral Oral  SpO2: 96% 97% 99% 99%  Weight:      Height:        Intake/Output Summary (Last 24 hours) at 10/30/2020 1651 Last data filed at 10/30/2020 0092 Gross per 24 hour  Intake 464.78 ml  Output 1500 ml  Net -1035.22 ml   Filed Weights   10/24/20 2040 10/26/20 1622  Weight: 75.3 kg 74.5 kg     Exam:  General: Chronically ill-appearing man who is not moving his mouth very much as he speaks.  He has some dry nose and crusting around his lips. Eyes: sclera anicteric, conjuctiva mild injection bilaterally CVS: S1-S2, regular  Respiratory:  decreased air entry bilaterally secondary to decreased inspiratory effort, rales at bases  GI: NABS, soft, NT  LE: No edema.  Neuro: grossly nonfocal.  Psych: patient is logical and coherent, affect appropriate to situation.   Assessment & Plan:   Decreased p.o. intake secondary to  stomatitis/mucositis and possible radiation esophagitis Patient is on Magic mouthwash, viscous lidocaine, as needed IV morphine and Toradol Appreciate dietitian input, on full liquid diet and Ensure given moderate protein calorie malnutrition Presently off any IV fluids We will refer patient to SLP if they have any recommendations for food Able to take oral medications at this time.  Hypokalemia Is been repleted, will continue to follow  Hypophosphatemia Is now low normal, however will provide a smaller dose of K-Phos and repeat in the morning  Hypomagnesemia We will continue repletion and recheck in the morning  Anxiety depression Continue mirtazapine, Lexapro, buspirone and Xanax per home doses  DM2 Blood sugars under reasonable control on present medications Nasopharyngeal CA Ongoing treatment per radiation oncology and hematology oncology   DVT prophylaxis: Lovenox Code Status: Full Family Communication: Patient states he will communicate with his family Disposition Plan:   Patient is from: Home  Anticipated Discharge Location: Home  Barriers to Discharge: Ongoing mucositis and decreased p.o. intake and pain  Is patient medically stable for Discharge: No   Consultants:  Radiation oncology  Hematology oncology  Procedures:  None  Antimicrobials:  None   Data Reviewed:  Basic Metabolic Panel: Recent Labs  Lab 10/23/20 1750 10/24/20 2102 10/26/20 0608 10/27/20 0625 10/28/20 0420 10/29/20 0407 10/30/20 0446  NA  --    < > 136 138 134* 133* 133*  K  --    < > 3.5 3.4* 3.0*  3.0* 3.7  CL  --    < > 103 100 97* 97* 99  CO2  --    < > 24 25 26 26 24   GLUCOSE  --    < > 103* 91 96 116* 108*  BUN  --    < > 13 11 9  7* 6*  CREATININE  --    < > 0.91 0.76 0.61 0.70 0.71  CALCIUM  --    < > 7.3* 7.5* 6.9* 7.0* 7.0*  MG 1.5*  --  1.2* 1.7 1.5* 1.3* 1.6*  PHOS 2.7  --   --   --  1.2* 1.7* 1.8*   < > = values in this interval not displayed.   Liver  Function Tests: Recent Labs  Lab 10/25/20 0247 10/26/20 0608 10/28/20 0420 10/29/20 0407 10/30/20 0446  AST 26 16 15 22 20   ALT 23 16 12 14 13   ALKPHOS 81 65 63 62 56  BILITOT 0.7 0.9 0.9 1.0 1.0  PROT 7.6 5.6* 5.4* 5.7* 5.5*  ALBUMIN 3.9 3.0* 2.7* 2.7* 2.7*   Recent Labs  Lab 10/25/20 0247  LIPASE 18   No results for input(s): AMMONIA in the last 168 hours. CBC: Recent Labs  Lab 10/26/20 0608 10/27/20 0625 10/28/20 0420 10/29/20 0407 10/30/20 0446  WBC 4.3 4.4 6.3 5.6 3.7*  NEUTROABS  --   --   --  4.4 2.7  HGB 8.5* 10.3* 8.8* 8.6* 8.6*  HCT 25.6* 30.6* 25.9* 25.0* 25.9*  MCV 93.1 92.4 92.8 89.9 93.2  PLT 145* 190 141* 151 138*   Cardiac Enzymes: No results for input(s): CKTOTAL, CKMB, CKMBINDEX, TROPONINI in the last 168 hours. BNP (last 3 results) No results for input(s): PROBNP in the last 8760 hours. CBG: Recent Labs  Lab 10/29/20 1627 10/29/20 2154 10/30/20 0747 10/30/20 1156 10/30/20 1645  GLUCAP 161* 107* 105* 117* 120*    Recent Results (from the past 240 hour(s))  Resp Panel by RT-PCR (Flu A&B, Covid) Nasopharyngeal Swab     Status: None   Collection Time: 10/25/20  4:07 AM   Specimen: Nasopharyngeal Swab; Nasopharyngeal(NP) swabs in vial transport medium  Result Value Ref Range Status   SARS Coronavirus 2 by RT PCR NEGATIVE NEGATIVE Final    Comment: (NOTE) SARS-CoV-2 target nucleic acids are NOT DETECTED.  The SARS-CoV-2 RNA is generally detectable in upper respiratory specimens during the acute phase of infection. The lowest concentration of SARS-CoV-2 viral copies this assay can detect is 138 copies/mL. A negative result does not preclude SARS-Cov-2 infection and should not be used as the sole basis for treatment or other patient management decisions. A negative result may occur with  improper specimen collection/handling, submission of specimen other than nasopharyngeal swab, presence of viral mutation(s) within the areas targeted by  this assay, and inadequate number of viral copies(<138 copies/mL). A negative result must be combined with clinical observations, patient history, and epidemiological information. The expected result is Negative.  Fact Sheet for Patients:  EntrepreneurPulse.com.au  Fact Sheet for Healthcare Providers:  IncredibleEmployment.be  This test is no t yet approved or cleared by the [place] FDA and  has been authorized for detection and/or diagnosis of SARS-CoV-2 by FDA under an Emergency Use Authorization (EUA). This EUA will remain  in effect (meaning this test can be used) for the duration of the COVID-19 declaration under Section 564(b)(1) of the Act, 21 U.S.C.section 360bbb-3(b)(1), unless the authorization is terminated  or revoked sooner.  Influenza A by PCR NEGATIVE NEGATIVE Final   Influenza B by PCR NEGATIVE NEGATIVE Final    Comment: (NOTE) The Xpert Xpress SARS-CoV-2/FLU/RSV plus assay is intended as an aid in the diagnosis of influenza from Nasopharyngeal swab specimens and should not be used as a sole basis for treatment. Nasal washings and aspirates are unacceptable for Xpert Xpress SARS-CoV-2/FLU/RSV testing.  Fact Sheet for Patients: EntrepreneurPulse.com.au  Fact Sheet for Healthcare Providers: IncredibleEmployment.be  This test is not yet approved or cleared by the [place] FDA and has been authorized for detection and/or diagnosis of SARS-CoV-2 by FDA under an Emergency Use Authorization (EUA). This EUA will remain in effect (meaning this test can be used) for the duration of the COVID-19 declaration under Section 564(b)(1) of the Act, 21 U.S.C. section 360bbb-3(b)(1), unless the authorization is terminated or revoked.  Performed at [facility], [address] [place], [place] [address]       Studies: No results found.   Scheduled Meds: . acetaminophen   1,000 mg Oral Once  . aspirin EC  81 mg Oral Daily  . atorvastatin  80 mg Oral QPM  . busPIRone  10 mg Oral QHS  . calcium carbonate  1 tablet Oral TID WC  . clopidogrel  75 mg Oral Daily  . enoxaparin (LOVENOX) injection  40 mg Subcutaneous Q24H  . escitalopram  20 mg Oral q morning - 10a  . feeding supplement  237 mL Oral TID BM  . insulin aspart  0-5 Units Subcutaneous QHS  . insulin aspart  0-9 Units Subcutaneous TID WC  . lidocaine  15 mL Mouth/Throat Q4H  . linagliptin  5 mg Oral Daily  . losartan  100 mg Oral q morning - 10a  . magic mouthwash  15 mL Oral QID  . magnesium oxide  400 mg Oral Daily  . mirtazapine  15 mg Oral QHS  . multivitamin with minerals  1 tablet Oral Daily  . pantoprazole  20 mg Oral BID  . sucralfate  1 g Oral QID   Continuous Infusions: . lactated ringers with kcl 100 mL/hr at 10/30/20 0810  . potassium PHOSPHATE IVPB (in mmol) 20 mmol (10/30/20 1541)    Principal Problem:   Painful swallowing Active Problems:   Anxiety and depression   Diabetes mellitus type 2, controlled, without complications (HCC)   Generalized weakness   Nasopharyngeal carcinoma (HCC)   Inadequate oral intake   Mucositis due to antineoplastic therapy   Mucositis due to radiation therapy   Odynophagia   Malnutrition of moderate degree     Edword [place] Grant Henkes, [facility]  If 7PM-7AM, please contact night-coverage [email or web address] Password [email or web address] 10/30/2020, 4:52 PM    LOS: 5 days

## 2020-10-30 NOTE — Care Management Important Message (Signed)
Important Message  Patient Details  Name: Kenneth Franklin MRN: [id number] Date of Birth: 17-Sep-1946   Medicare Important Message Given:  Yes     Juliann Pulse A Zykera Abella 10/30/2020, 11:04 AM

## 2020-10-30 NOTE — Progress Notes (Signed)
[facility] Hematology/Oncology Progress Note  Date of admission: 10/25/2020  Hospital day:  10/29/2020  Chief Complaint: Kenneth Franklin is a 73 y.o. male with stage III nasopharyngeal carcinoma who was admitted through the emergency room with odynophagia and decreased oral intake.  Subjective:  Feeling a little better today.  Able to drink and eat soft foods better.  Discouraged.  Social History: The patient is alone today.  Allergies:  Allergies  Allergen Reactions  . Propoxyphene     Unknown reaction    Scheduled Medications: . acetaminophen  1,000 mg Oral Once  . aspirin EC  81 mg Oral Daily  . atorvastatin  80 mg Oral QPM  . busPIRone  10 mg Oral QHS  . calcium carbonate  1 tablet Oral TID WC  . clopidogrel  75 mg Oral Daily  . enoxaparin (LOVENOX) injection  40 mg Subcutaneous Q24H  . escitalopram  20 mg Oral q morning - 10a  . feeding supplement  237 mL Oral TID BM  . insulin aspart  0-5 Units Subcutaneous QHS  . insulin aspart  0-9 Units Subcutaneous TID WC  . lidocaine  15 mL Mouth/Throat Q4H  . linagliptin  5 mg Oral Daily  . losartan  100 mg Oral q morning - 10a  . magic mouthwash  15 mL Oral QID  . magnesium oxide  400 mg Oral Daily  . mirtazapine  15 mg Oral QHS  . multivitamin with minerals  1 tablet Oral Daily  . pantoprazole  20 mg Oral BID  . sucralfate  1 g Oral QID    Review of Systems  Constitutional: Positive for malaise/fatigue. Negative for chills, diaphoresis and fever.       Feels slightly better.  HENT: Negative for congestion, ear pain, nosebleeds and sinus pain.        Mouth sore-slightly improved.  Eyes: Negative.  Negative for blurred vision, double vision and photophobia.  Respiratory: Negative.  Negative for cough, hemoptysis, sputum production and shortness of breath.   Cardiovascular: Negative.  Negative for chest pain, palpitations and leg swelling.  Gastrointestinal: Negative for abdominal pain, blood in  stool, diarrhea, heartburn, melena, nausea and vomiting.       Eating soft food (pudding and jello).  Genitourinary: Negative.  Negative for dysuria, frequency, hematuria and urgency.  Musculoskeletal: Negative.  Negative for back pain, joint pain, myalgias and neck pain.  Skin: Negative.  Negative for rash.  Neurological: Negative.  Negative for dizziness, sensory change, speech change, focal weakness, weakness and headaches.  Endo/Heme/Allergies: Negative.  Does not bruise/bleed easily.  Psychiatric/Behavioral: Negative.  Negative for memory loss. The patient is not nervous/anxious and does not have insomnia.        Discouraged.   Vitals: Blood pressure 130/71, pulse 75, temperature 98.6 F (37 C), temperature source Oral, resp. rate (!) 22, height 6' (1.829 m), weight 164 lb 4.8 oz (74.5 kg), SpO2 96 %.   Physical Exam Vitals and nursing note reviewed.  Constitutional:      General: He is not in acute distress.    Appearance: He is not ill-appearing.     Comments: Fatigued appearing gentleman sitting up on the medical unit with a tray of soft foods, water and Ensure on his bedside table.  HENT:     Head: Normocephalic and atraumatic.     Comments: Short gray hair.    Mouth/Throat:     Comments: Mucositis between hard and soft palate have improved. Eyes:  General: No scleral icterus.    Conjunctiva/sclera: Conjunctivae normal.     Pupils: Pupils are equal, round, and reactive to light.  Cardiovascular:     Rate and Rhythm: Normal rate.  Pulmonary:     Effort: Pulmonary effort is normal.     Breath sounds: Normal breath sounds. No wheezing, rhonchi or rales.  Abdominal:     General: Bowel sounds are normal.     Palpations: Abdomen is soft.     Tenderness: There is no abdominal tenderness. There is no guarding or rebound.  Musculoskeletal:        General: No swelling.  Lymphadenopathy:     Cervical: No cervical adenopathy.  Skin:    General: Skin is warm and dry.      Coloration: Skin is not jaundiced or pale.  Neurological:     General: No focal deficit present.     Mental Status: He is alert. Mental status is at baseline.  Psychiatric:        Mood and Affect: Mood normal.        Behavior: Behavior normal.        Thought Content: Thought content normal.        Judgment: Judgment normal.     Results for orders placed or performed during the hospital encounter of 10/25/20 (from the past 48 hour(s))  Comprehensive metabolic panel     Status: Abnormal   Collection Time: 10/28/20  4:20 AM  Result Value Ref Range   Sodium 134 (L) 135 - 145 mmol/L   Potassium 3.0 (L) 3.5 - 5.1 mmol/L   Chloride 97 (L) 98 - 111 mmol/L   CO2 26 22 - 32 mmol/L   Glucose, Bld 96 70 - 99 mg/dL    Comment: Glucose reference range applies only to samples taken after fasting for at least 8 hours.   BUN 9 8 - 23 mg/dL   Creatinine, Ser 0.61 0.61 - 1.24 mg/dL   Calcium 6.9 (L) 8.9 - 10.3 mg/dL   Total Protein 5.4 (L) 6.5 - 8.1 g/dL   Albumin 2.7 (L) 3.5 - 5.0 g/dL   AST 15 15 - 41 U/L   ALT 12 0 - 44 U/L   Alkaline Phosphatase 63 38 - 126 U/L   Total Bilirubin 0.9 0.3 - 1.2 mg/dL   GFR, Estimated >60 >60 mL/min    Comment: (NOTE) Calculated using the CKD-EPI Creatinine Equation (2021)    Anion gap 11 5 - 15    Comment: Performed at [facility], [address] , [place] [address]  Magnesium     Status: Abnormal   Collection Time: 10/28/20  4:20 AM  Result Value Ref Range   Magnesium 1.5 (L) 1.7 - 2.4 mg/dL    Comment: Performed at [facility], [address] [place], [place] [address]  Phosphorus     Status: Abnormal   Collection Time: 10/28/20  4:20 AM  Result Value Ref Range   Phosphorus 1.2 (L) 2.5 - 4.6 mg/dL    Comment: Performed at [facility], [address] [place],  [address]  CBC     Status: Abnormal   Collection Time: 10/28/20  4:20 AM  Result Value Ref Range   WBC 6.3 4.0 - 10.5 K/uL   RBC 2.79 (L) 4.22  - 5.81 MIL/uL   Hemoglobin 8.8 (L) 13.0 - 17.0 g/dL   HCT 25.9 (L) 39 - 52 %   MCV 92.8 80.0 - 100.0 fL   MCH  31.5 26.0 - 34.0 pg   MCHC 34.0 30.0 - 36.0 g/dL   RDW 15.1 11.5 - 15.5 %   Platelets 141 (L) 150 - 400 K/uL   nRBC 0.0 0.0 - 0.2 %    Comment: Performed at [facility], [address] [place], [place] [address]  Glucose, capillary     Status: None   Collection Time: 10/28/20  7:48 AM  Result Value Ref Range   Glucose-Capillary 97 70 - 99 mg/dL    Comment: Glucose reference range applies only to samples taken after fasting for at least 8 hours.   Comment 1 Notify RN   Glucose, capillary     Status: Abnormal   Collection Time: 10/28/20 12:24 PM  Result Value Ref Range   Glucose-Capillary 120 (H) 70 - 99 mg/dL    Comment: Glucose reference range applies only to samples taken after fasting for at least 8 hours.   Comment 1 Notify RN   Glucose, capillary     Status: Abnormal   Collection Time: 10/28/20  4:46 PM  Result Value Ref Range   Glucose-Capillary 143 (H) 70 - 99 mg/dL    Comment: Glucose reference range applies only to samples taken after fasting for at least 8 hours.   Comment 1 Notify RN   Glucose, capillary     Status: Abnormal   Collection Time: 10/28/20  8:45 PM  Result Value Ref Range   Glucose-Capillary 114 (H) 70 - 99 mg/dL    Comment: Glucose reference range applies only to samples taken after fasting for at least 8 hours.  CBC with Differential/Platelet     Status: Abnormal   Collection Time: 10/29/20  4:07 AM  Result Value Ref Range   WBC 5.6 4.0 - 10.5 K/uL   RBC 2.78 (L) 4.22 - 5.81 MIL/uL   Hemoglobin 8.6 (L) 13.0 - 17.0 g/dL   HCT 25.0 (L) 39 - 52 %   MCV 89.9 80.0 - 100.0 fL   MCH 30.9 26.0 - 34.0 pg   MCHC 34.4 30.0 - 36.0 g/dL   RDW 15.0 11.5 - 15.5 %   Platelets 151 150 - 400 K/uL   nRBC 0.0 0.0 - 0.2 %   Neutrophils Relative % 78 %   Neutro Abs 4.4 1.7 - 7.7 K/uL   Lymphocytes Relative 11 %   Lymphs Abs 0.6 (L) 0.7 - 4.0  K/uL   Monocytes Relative 9 %   Monocytes Absolute 0.5 0.1 - 1.0 K/uL   Eosinophils Relative 0 %   Eosinophils Absolute 0.0 0.0 - 0.5 K/uL   Basophils Relative 0 %   Basophils Absolute 0.0 0.0 - 0.1 K/uL   Immature Granulocytes 2 %   Abs Immature Granulocytes 0.10 (H) 0.00 - 0.07 K/uL    Comment: Performed at [facility], [address] [place], [place] [address]  Comprehensive metabolic panel     Status: Abnormal   Collection Time: 10/29/20  4:07 AM  Result Value Ref Range   Sodium 133 (L) 135 - 145 mmol/L   Potassium 3.0 (L) 3.5 - 5.1 mmol/L   Chloride 97 (L) 98 - 111 mmol/L   CO2 26 22 - 32 mmol/L   Glucose, Bld 116 (H) 70 - 99 mg/dL    Comment: Glucose reference range applies only to samples taken after fasting for at least 8 hours.   BUN 7 (L) 8 - 23 mg/dL   Creatinine, Ser 0.70 0.61 - 1.24 mg/dL   Calcium 7.0 (L)  8.9 - 10.3 mg/dL   Total Protein 5.7 (L) 6.5 - 8.1 g/dL   Albumin 2.7 (L) 3.5 - 5.0 g/dL   AST 22 15 - 41 U/L   ALT 14 0 - 44 U/L   Alkaline Phosphatase 62 38 - 126 U/L   Total Bilirubin 1.0 0.3 - 1.2 mg/dL   GFR, Estimated >60 >60 mL/min    Comment: (NOTE) Calculated using the CKD-EPI Creatinine Equation (2021)    Anion gap 10 5 - 15    Comment: Performed at [facility], [address] [place], [place] [address]  Magnesium     Status: Abnormal   Collection Time: 10/29/20  4:07 AM  Result Value Ref Range   Magnesium 1.3 (L) 1.7 - 2.4 mg/dL    Comment: Performed at [facility], [address], [place], [place] [address]  Phosphorus     Status: Abnormal   Collection Time: 10/29/20  4:07 AM  Result Value Ref Range   Phosphorus 1.7 (L) 2.5 - 4.6 mg/dL    Comment: Performed at [facility], [address] [place],  [address]  Glucose, capillary     Status: Abnormal   Collection Time: 10/29/20  8:24 AM  Result Value Ref Range   Glucose-Capillary 109 (H) 70 - 99 mg/dL    Comment: Glucose reference range  applies only to samples taken after fasting for at least 8 hours.  Glucose, capillary     Status: None   Collection Time: 10/29/20 12:10 PM  Result Value Ref Range   Glucose-Capillary 98 70 - 99 mg/dL    Comment: Glucose reference range applies only to samples taken after fasting for at least 8 hours.  Glucose, capillary     Status: Abnormal   Collection Time: 10/29/20  4:27 PM  Result Value Ref Range   Glucose-Capillary 161 (H) 70 - 99 mg/dL    Comment: Glucose reference range applies only to samples taken after fasting for at least 8 hours.  Glucose, capillary     Status: Abnormal   Collection Time: 10/29/20  9:54 PM  Result Value Ref Range   Glucose-Capillary 107 (H) 70 - 99 mg/dL    Comment: Glucose reference range applies only to samples taken after fasting for at least 8 hours.   Comment 1 Notify RN    No results found.  Assessment:  Kenneth Franklin is a 74 y.o. male with stage III nasopharyngeal carcinoma. He is s/p 6 weeks of concurrent radiation and weekly cisplatin.  PET scanon 08/18/2020 revealed a 5.7 x 4.2 cm destructive hypermetabolic mass (SUV 52.77) involving the sphenoid sinus, skull base and posterior nasopharynx. This was consistent with patient's known squamous cell carcinoma. There was no locoregional lymphadenopathy or distant metastatic disease.  Symptomatically, he is feeling a little better.  He is able to drink and eat a little more.  Exam reveals improving mucositis.    Plan:   1.   Stage III nasopharyngeal carcinoma             Patient is s/p 6 weeks of concurrent chemotherapy and weekly cisplatin.             Patient last received chemotherapy on 10/20/2020 and radiation on 10/21/2020.             Patient seen by Dr Baruch Gouty.  No plans for further radiation (only 3 fractions were left).   Discuss plan for additional chemotherapy alone in approximately 4 weeks. 2.   Mucositis  Patient on morphine 4 mg IV every 3 hours prn pain and Toradol  15 mg po q 6 hours prn pain.             He has Magic mouthwash 4 times a day and viscous lidocaine every 4 hours.   Viscous lidocaine works well per patient.             He has Norco 10-325 mg 1/2 tablet every 4 hours prn pain.             He is eating soft food (jello and pudding) and drinking water and Ensure.             He is using Chapstick for his lips.  Discuss plan to be able to drink enough fluids and maintain caloric intake on oral medications to be discharged.  Discuss option of daily IVF in clinic next week. 3.   Electrolyte issues             Potassium 3.0.  Magnesium 1.3.  Phosphorus 1.7.  Calcium 7.0 (corrected 8.04 - low).             Patient has required IV supplementation in OPD secondary to cisplatin induced wasting.  Continue to monitor and supplement 4.   Normocytic anemia             Etiology secondary to chemotherapy.  He denies bleeding.  Hematocrit 34.8.  Hemoglobin 11.5.  MCV 91.3 on 10/20/2020.             Hematocrit 25.6.  Hemoglobin   8.5.  MCV 93.1 on 10/26/2020.  Hematocrit 25.0.  Hemoglobin   8.8.  MCV 89.9 on 10/29/2020.             Ferritin (471) with iron saturation of 19% and a TIBC 221.  B12 (242) and folate (13.1).  B12 goal is 400.  Consider oral B12 supplementation. 5.   Disposition  Anticipate discharge in 2-3 days.  Patient can receive IVF in outpatient department next week if needed.   Lequita Asal, MD  10/30/2020, 1:31 AM

## 2020-10-30 NOTE — Progress Notes (Signed)
PT Cancellation Note  Patient Details Name: NASSER KU MRN: [id number] DOB: 03-Dec-1945   Cancelled Treatment:    Reason Eval/Treat Not Completed:  (Consult received and chart reviewed.  Patient politely declining evaluation at this time, "I ain't in no shape for PT right now".  Endorses generalized fatigue and general malaise.  Requests therapist re-attempt at later time/date.  Will continue efforts as appropriate)   Syncere Kaminski H. Owens Shark, PT, DPT, NCS 10/30/20, 10:20 AM [phone]

## 2020-10-31 DIAGNOSIS — R1310 Dysphagia, unspecified: Secondary | ICD-10-CM | POA: Diagnosis not present

## 2020-10-31 LAB — COMPREHENSIVE METABOLIC PANEL
ALT: 13 U/L (ref 0–44)
AST: 20 U/L (ref 15–41)
Albumin: 2.9 g/dL — ABNORMAL LOW (ref 3.5–5.0)
Alkaline Phosphatase: 68 U/L (ref 38–126)
Anion gap: 7 (ref 5–15)
BUN: 6 mg/dL — ABNORMAL LOW (ref 8–23)
CO2: 26 mmol/L (ref 22–32)
Calcium: 7.9 mg/dL — ABNORMAL LOW (ref 8.9–10.3)
Chloride: 102 mmol/L (ref 98–111)
Creatinine, Ser: 0.73 mg/dL (ref 0.61–1.24)
GFR, Estimated: >60 mL/min (ref >60)
Glucose, Bld: 108 mg/dL — ABNORMAL HIGH (ref 70–99)
Potassium: 4.7 mmol/L (ref 3.5–5.1)
Sodium: 135 mmol/L (ref 135–145)
Total Bilirubin: 0.8 mg/dL (ref 0.3–1.2)
Total Protein: 6.4 g/dL — ABNORMAL LOW (ref 6.5–8.1)

## 2020-10-31 LAB — MAGNESIUM: Magnesium: 1.7 mg/dL (ref 1.7–2.4)

## 2020-10-31 LAB — CBC WITH DIFFERENTIAL/PLATELET
Abs Immature Granulocytes: 0.03 10*3/uL (ref 0.00–0.07)
Basophils Absolute: 0 10*3/uL (ref 0.0–0.1)
Basophils Relative: 1 %
Eosinophils Absolute: 0 10*3/uL (ref 0.0–0.5)
Eosinophils Relative: 0 %
HCT: 26.6 % — ABNORMAL LOW (ref 39.0–52.0)
Hemoglobin: 9.2 g/dL — ABNORMAL LOW (ref 13.0–17.0)
Immature Granulocytes: 1 %
Lymphocytes Relative: 16 %
Lymphs Abs: 0.5 10*3/uL — ABNORMAL LOW (ref 0.7–4.0)
MCH: 31.2 pg (ref 26.0–34.0)
MCHC: 34.6 g/dL (ref 30.0–36.0)
MCV: 90.2 fL (ref 80.0–100.0)
Monocytes Absolute: 0.4 10*3/uL (ref 0.1–1.0)
Monocytes Relative: 13 %
Neutro Abs: 2.3 10*3/uL (ref 1.7–7.7)
Neutrophils Relative %: 69 %
Platelets: 165 10*3/uL (ref 150–400)
RBC: 2.95 MIL/uL — ABNORMAL LOW (ref 4.22–5.81)
RDW: 15.4 % (ref 11.5–15.5)
WBC: 3.3 10*3/uL — ABNORMAL LOW (ref 4.0–10.5)
nRBC: 0 % (ref 0.0–0.2)

## 2020-10-31 LAB — GLUCOSE, CAPILLARY
Glucose-Capillary: 108 mg/dL — ABNORMAL HIGH (ref 70–99)
Glucose-Capillary: 120 mg/dL — ABNORMAL HIGH (ref 70–99)
Glucose-Capillary: 131 mg/dL — ABNORMAL HIGH (ref 70–99)
Glucose-Capillary: 104 mg/dL — ABNORMAL HIGH (ref 70–99)

## 2020-10-31 LAB — PHOSPHORUS: Phosphorus: 2.4 mg/dL — ABNORMAL LOW (ref 2.5–4.6)

## 2020-10-31 MED ORDER — LORAZEPAM 1 MG PO TABS: 1.0000 mg | ORAL_TABLET | Freq: Three times a day (TID) | ORAL | Status: DC | PRN

## 2020-10-31 MED ORDER — MAGNESIUM SULFATE 2 GM/50ML IV SOLN
2.0000 g | Freq: Once | INTRAVENOUS | Status: AC
  Administered 2020-10-31: 2 g via INTRAVENOUS
  Filled 2020-10-31: qty 50

## 2020-10-31 NOTE — Progress Notes (Signed)
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 intake and FTT.  Patient has been treated symptomatically with lidocaine, IV morphine and IV fluid resuscitation.   Subjective:  Patient's main concern is irritation in his right eye.  Notes he has had this before and artificial tears have been helpful however he ran out of his tears.  Denies injuring his eye.  Notes that it started when he woke up this morning.  Patient does not think he feels better when I note that he is talking much more comfortably and appears to be enunciating his words easily patient shrugs and says maybe he is better.  Objective: Vitals:   10/30/20 2127 10/30/20 2342 10/31/20 0438 10/31/20 0700  BP: (!) 152/76 (!) 147/74 137/73 (!) 150/78  Pulse: 73 68 67 70  Resp: 19 18 18 19   Temp: 98 F (36.7 C) 98.4 F (36.9 C) 98.8 F (37.1 C) (!) 97.5 F (36.4 C)  TempSrc:    Oral  SpO2: 98% 98% 97% 97%  Weight:      Height:        Intake/Output Summary (Last 24 hours) at 10/31/2020 1513 Last data filed at 10/31/2020 0500 Gross per 24 hour  Intake 1213.39 ml  Output 2850 ml  Net -1636.61 ml   Filed Weights   10/24/20 2040 10/26/20 1622  Weight: 75.3 kg 74.5 kg     Exam:  General: Patient appears better this morning, is sitting up in bed trying to put drops in his eyes, is speaking more clearly without evidence of pain with talking.   Eyes: Conjunctiva are noninjected, no evidence of irritation, patient does have some crusting on his eyelids bilaterally.  Sclera anicteric,  CVS: S1-S2, regular  Respiratory:  decreased air entry bilaterally secondary to decreased inspiratory  effort, rales at bases  GI: NABS, soft, NT  LE: No edema.  Neuro: grossly nonfocal.  Psych: patient is logical and coherent, affect appropriate to situation.   Assessment & Plan:   Decreased p.o. intake secondary to stomatitis/mucositis and possible radiation esophagitis Patient does seem much improved today, is speaking much more easily and mouth does not appear as erythematous or painful. Continue Magic mouthwash, viscous lidocaine, as needed IV morphine and Toradol Try Biotene for increased lubrication Appreciate dietitian input, on full liquid diet and Ensure given moderate protein calorie malnutrition Presently off any IV fluids SLP evaluation and recommendations are pending. Able to take oral medications at this time.  Eye discomfort Eye exam without evidence of foreign body or irritation Patient has known dry eyes Restart artificial tears and follow symptoms  Hypokalemia Normalized after repletion  Hypophosphatemia Almost normalized at 2.4, 2.5 being lower limits of normal To off on further K-Phos given potassium 4.7 today Can add K-Phos in the morning as warranted  Hypomagnesemia Magnesium persistently low, but improving very slowly despite repletion We will give another 2 g of magnesium today and check in the morning.  Anxiety depression Continue mirtazapine, Lexapro, buspirone and Xanax per home doses  DM2 Blood sugars under reasonable control on present medications Nasopharyngeal CA Ongoing treatment per radiation oncology and hematology oncology   DVT prophylaxis: Lovenox Code Status: Full Family Communication: Patient states he will communicate with  his family Disposition Plan:   Patient is from: Home  Anticipated Discharge Location: Home  Barriers to Discharge: Ongoing mucositis and decreased p.o. intake and pain  Is patient medically stable for Discharge: No   Consultants:  Radiation oncology  Hematology  oncology  Procedures:  None  Antimicrobials:  None   Data Reviewed:  Basic Metabolic Panel: Recent Labs  Lab 10/27/20 0625 10/28/20 0420 10/29/20 0407 10/30/20 0446 10/31/20 0521  NA 138 134* 133* 133* 135  K 3.4* 3.0* 3.0* 3.7 4.7  CL 100 97* 97* 99 102  CO2 25 26 26 24 26   GLUCOSE 91 96 116* 108* 108*  BUN 11 9 7* 6* 6*  CREATININE 0.76 0.61 0.70 0.71 0.73  CALCIUM 7.5* 6.9* 7.0* 7.0* 7.9*  MG 1.7 1.5* 1.3* 1.6* 1.7  PHOS  --  1.2* 1.7* 1.8* 2.4*   Liver Function Tests: Recent Labs  Lab 10/26/20 0608 10/28/20 0420 10/29/20 0407 10/30/20 0446 10/31/20 0521  AST 16 15 22 20 20   ALT 16 12 14 13 13   ALKPHOS 65 63 62 56 68  BILITOT 0.9 0.9 1.0 1.0 0.8  PROT 5.6* 5.4* 5.7* 5.5* 6.4*  ALBUMIN 3.0* 2.7* 2.7* 2.7* 2.9*   Recent Labs  Lab 10/25/20 0247  LIPASE 18   No results for input(s): AMMONIA in the last 168 hours. CBC: Recent Labs  Lab 10/27/20 0625 10/28/20 0420 10/29/20 0407 10/30/20 0446 10/31/20 0521  WBC 4.4 6.3 5.6 3.7* 3.3*  NEUTROABS  --   --  4.4 2.7 2.3  HGB 10.3* 8.8* 8.6* 8.6* 9.2*  HCT 30.6* 25.9* 25.0* 25.9* 26.6*  MCV 92.4 92.8 89.9 93.2 90.2  PLT 190 141* 151 138* 165   Cardiac Enzymes: No results for input(s): CKTOTAL, CKMB, CKMBINDEX, TROPONINI in the last 168 hours. BNP (last 3 results) No results for input(s): PROBNP in the last 8760 hours. CBG: Recent Labs  Lab 10/30/20 1156 10/30/20 1645 10/30/20 2128 10/31/20 0820 10/31/20 1141  GLUCAP 117* 120* 127* 108* 120*    Recent Results (from the past 240 hour(s))  Resp Panel by RT-PCR (Flu A&B, Covid) Nasopharyngeal Swab     Status: None   Collection Time: 10/25/20  4:07 AM   Specimen: Nasopharyngeal Swab; Nasopharyngeal(NP) swabs in vial transport medium  Result Value Ref Range Status   SARS Coronavirus 2 by RT PCR NEGATIVE NEGATIVE Final    Comment: (NOTE) SARS-CoV-2 target nucleic acids are NOT DETECTED.  The SARS-CoV-2 RNA is generally detectable in upper  respiratory specimens during the acute phase of infection. The lowest concentration of SARS-CoV-2 viral copies this assay can detect is 138 copies/mL. A negative result does not preclude SARS-Cov-2 infection and should not be used as the sole basis for treatment or other patient management decisions. A negative result may occur with  improper specimen collection/handling, submission of specimen other than nasopharyngeal swab, presence of viral mutation(s) within the areas targeted by this assay, and inadequate number of viral copies(<138 copies/mL). A negative result must be combined with clinical observations, patient history, and epidemiological information. The expected result is Negative.  Fact Sheet for Patients:  EntrepreneurPulse.com.au  Fact Sheet for Healthcare Providers:  IncredibleEmployment.be  This test is no t yet approved or cleared by the [place] FDA and  has been authorized for detection and/or diagnosis of SARS-CoV-2 by FDA under an Emergency Use Authorization (EUA). This EUA will remain  in effect (meaning this test can be used) for the duration of the COVID-19 declaration under Section  564(b)(1) of the Act, 21 U.S.C.section 360bbb-3(b)(1), unless the authorization is terminated  or revoked sooner.       Influenza A by PCR NEGATIVE NEGATIVE Final   Influenza B by PCR NEGATIVE NEGATIVE Final    Comment: (NOTE) The Xpert Xpress SARS-CoV-2/FLU/RSV plus assay is intended as an aid in the diagnosis of influenza from Nasopharyngeal swab specimens and should not be used as a sole basis for treatment. Nasal washings and aspirates are unacceptable for Xpert Xpress SARS-CoV-2/FLU/RSV testing.  Fact Sheet for Patients: EntrepreneurPulse.com.au  Fact Sheet for Healthcare Providers: IncredibleEmployment.be  This test is not yet approved or cleared by the [place] FDA and has been  authorized for detection and/or diagnosis of SARS-CoV-2 by FDA under an Emergency Use Authorization (EUA). This EUA will remain in effect (meaning this test can be used) for the duration of the COVID-19 declaration under Section 564(b)(1) of the Act, 21 U.S.C. section 360bbb-3(b)(1), unless the authorization is terminated or revoked.  Performed at [facility], [address], [place], [place] [address]       Studies: No results found.   Scheduled Meds: . acetaminophen  1,000 mg Oral Once  . aspirin EC  81 mg Oral Daily  . atorvastatin  80 mg Oral QPM  . busPIRone  10 mg Oral QHS  . calcium carbonate  1 tablet Oral TID WC  . clopidogrel  75 mg Oral Daily  . enoxaparin (LOVENOX) injection  40 mg Subcutaneous Q24H  . escitalopram  20 mg Oral q morning - 10a  . feeding supplement  237 mL Oral TID BM  . insulin aspart  0-5 Units Subcutaneous QHS  . insulin aspart  0-9 Units Subcutaneous TID WC  . lidocaine  15 mL Mouth/Throat Q4H  . linagliptin  5 mg Oral Daily  . losartan  100 mg Oral q morning - 10a  . magic mouthwash  15 mL Oral QID  . magnesium oxide  400 mg Oral Daily  . mirtazapine  15 mg Oral QHS  . multivitamin with minerals  1 tablet Oral Daily  . pantoprazole  20 mg Oral BID  . sucralfate  1 g Oral QID   Continuous Infusions: . 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

## 2020-10-31 NOTE — Plan of Care (Signed)
?  Problem: Health Behavior/Discharge Planning: ?Goal: Ability to manage health-related needs will improve ?Outcome: Progressing ?  ?Problem: Clinical Measurements: ?Goal: Ability to maintain clinical measurements within normal limits will improve ?Outcome: Progressing ?Goal: Will remain free from infection ?Outcome: Progressing ?Goal: Diagnostic test results will improve ?Outcome: Progressing ?  ?Problem: Activity: ?Goal: Risk for activity intolerance will decrease ?Outcome: Progressing ?  ?

## 2020-10-31 NOTE — Evaluation (Signed)
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 intake and FTT.  Patient has been treated symptomatically with lidocaine, IV morphine and IV fluid resuscitation.    Clinical Impression  Patient alert and oriented upon arrival and agrees to participate in PT with some encouragement. Patient states he lives alone in a one-story home with a ramp and no handrails to enter.  Prior to hospitalization, he was independent with dressing, bathing, and home care except his [employer] brought food daily.  He reported being independent with ambulation without an assistive device but has a standard walker and a cane at home.  Upon physical therapy evaluation, patient became easily short of breath although his O2 saturation and heart rate remained within functional limits.  He did not quite require assistance with bed mobility and was able to complete sit to stand transfers with contact-guard assist.  He ambulated approximately 75 feet with a rolling walker and contact-guard assist but became very fatigued quickly and demonstrated questionable walker handling skills when distracted by fatigue.  He was able to stand without the walker but was unsteady on his feet.  Patient appears to have experienced decline in functional mobility and independence and does not appear to be safe at home by himself at this point but has the potential for good improvement in the next few days with mobility training.  Patient would benefit from short-term rehab prior to returning home to restore his functional independence and safe mobility. Patient would benefit from skilled physical therapy to address  impairments and functional limitations (see PT Problem List below) to work towards stated goals and return to PLOF or maximal functional independence.       Follow Up Recommendations SNF    Equipment Recommendations  Rolling walker with 5" wheels;3in1 (PT)    Recommendations for Other Services       Precautions / Restrictions Precautions Precautions: Fall Restrictions Weight Bearing Restrictions: No      Mobility  Bed Mobility Overal bed mobility: Modified Independent             General bed mobility comments: supine to sit with extra time and HOB elevated    Transfers Overall transfer level: Needs assistance Equipment used: None;Rolling walker (2 wheeled) Transfers: Sit to/from Stand Sit to Stand: Min guard;From elevated surface         General transfer comment: Patient completed sit <> stand transfer from bed to chair with CGA for safety and no AD. A bit wobbly and unsteady on feet. Also transfered sit <> stand chair to chair with RW and CGA with improved steadiness.  Ambulation/Gait Ambulation/Gait assistance: Min guard Gait Distance (Feet): 75 Feet Assistive device: Rolling walker (2 wheeled) Gait Pattern/deviations: Decreased stride length Gait velocity: slow   General Gait Details: Patient ambulated slowly ~75 feet with RW. Became uncertain about continuing part way into ambulation feeling very short of breath and with increased respiration rate. SpO2 98% and HR 94 bpm. Continued on to complete ambulation but it seemed quite labored. Unsteady without RW.  Stairs            Wheelchair Mobility    Modified Rankin (Stroke Patients Only)       Balance Overall balance assessment: Needs assistance  Sitting balance-Leahy Scale: Good       Standing balance-Leahy Scale: Fair Standing balance comment: Patient able to transfer bed to chair with no AD but was mildly unsteady. Dependent on RW for safe ambulation.                              Pertinent Vitals/Pain Pain Assessment: No/denies pain    Home Living Family/patient expects to be discharged to:: Private residence Living Arrangements: Alone Available Help at Discharge: Available PRN/intermittently (Step-daughter, Otila Kluver brings meals daily. She works full time) Type of Home: House Home Access: Ramped entrance     [place]: One [place]: Environmental consultant - standard;Cane - single point Additional Comments: patient reports his walker does not have wheels    Prior Function Level of Independence: Needs assistance   Gait / Transfers Assistance Needed: Independent with ambulation in household and community distance without AD  ADL's / Homemaking Assistance Needed: I with dressing, bathing, housework, except reports stepdaughter brings him food daily.        Hand Dominance        Extremity/Trunk Assessment   Upper Extremity Assessment Upper Extremity Assessment: Overall WFL for tasks assessed    Lower Extremity Assessment Lower Extremity Assessment: Generalized weakness (Requires UE support to come to stand)       Communication   Communication: No difficulties  Cognition Arousal/Alertness: Awake/alert Behavior During Therapy: WFL for tasks assessed/performed Overall Cognitive Status: Within Functional Limits for tasks assessed                                        General Comments General comments (skin integrity, edema, etc.): became very short of breath with mobility    Exercises Other Exercises Other Exercises: educated pt on role of PT in acute care setting. Safe use of RW for mobiltiy. Discharge and equipment reccomendations.   Assessment/Plan    PT Assessment Patient needs continued PT services  PT Problem List Decreased strength;Cardiopulmonary status limiting activity;Decreased activity tolerance;Decreased knowledge of use of DME;Decreased balance;Decreased safety awareness;Decreased mobility;Decreased knowledge of  precautions       PT Treatment Interventions DME instruction;Gait training;Functional mobility training;Therapeutic activities;Therapeutic exercise;Balance training;Neuromuscular re-education;Patient/family education;Cognitive remediation    PT Goals (Current goals can be found in the Care Plan section)  Acute Rehab PT Goals Patient Stated Goal: to get better and go home PT Goal Formulation: With patient Time For Goal Achievement: 11/14/20 Potential to Achieve Goals: Good    Frequency Min 2X/week   Barriers to discharge Decreased caregiver support Patient is unsteady on his feet and lives alone    Co-evaluation               AM-PAC PT "6 Clicks" Mobility  Outcome Measure Help needed turning from your back to your side while in a flat bed without using bedrails?: None Help needed moving from lying on your back to sitting on the side of a flat bed without using bedrails?: None Help needed moving to and from a bed to a chair (including a wheelchair)?: A Little Help needed standing up from a chair using your arms (e.g., wheelchair or bedside chair)?: None Help needed to walk in hospital room?: A Little Help needed climbing 3-5 steps with a railing? : A Lot 6 Click Score: 20    End of Session Equipment Utilized During Treatment:  Gait belt (RW)   Patient left: in chair;with call bell/phone within reach;with chair alarm set Nurse Communication: Mobility status PT Visit Diagnosis: Unsteadiness on feet (R26.81);Muscle weakness (generalized) (M62.81)    Time: 1460-4799 PT Time Calculation (min) (ACUTE ONLY): 35 min   Charges:   PT Evaluation $PT Eval Moderate Complexity: 1 Mod PT Treatments $Gait Training: 8-22 mins        Everlean Alstrom. Graylon Good, PT, DPT 10/31/20, 11:40 AM

## 2020-10-31 NOTE — Evaluation (Signed)
Clinical/Bedside Swallow Evaluation Patient Details  Name: Kenneth Franklin MRN: [id number] Date of Birth: [birth year]  Today's Date: 10/31/2020 Time: SLP Start Time (ACUTE ONLY): 1520 SLP Stop Time (ACUTE ONLY): 1600 SLP Time Calculation (min) (ACUTE ONLY): 40 min  Past Medical History:  Past Medical History:  Diagnosis Date  . Acute ischemic stroke ([place]) 2017  . Alcohol abuse   . Anxiety   . Cancer ([place])   . Depression   . Diabetes mellitus without complication ([place])   . Dyspnea    pcp knows and ordered rescue inhaler  . Hyperlipidemia   . Hypertension   . Skin cancer    Squamous Cell Carcinoma In Situ   Past Surgical History:  Past Surgical History:  Procedure Laterality Date  . CATARACT EXTRACTION Bilateral   . COLONOSCOPY    . NASOPHARYNGOSCOPY N/A 08/12/2020   Procedure: ENDOSCOPIC NASOPHARYNGOSCOPY WITH BIOPSY;  Surgeon: Margaretha Sheffield, MD;  Location: [facility] ORS;  Service: ENT;  Laterality: N/A;  . PORTA CATH INSERTION N/A 08/24/2020   Procedure: PORTA CATH INSERTION;  Surgeon: Algernon Huxley, MD;  Location: [facility];  Service: Cardiovascular;  Laterality: N/A;   HPI:  The patient is a 74 y.o. gentleman with stage III nasopharyngeal carcinoma. He is s/p 6 weeks of concurrent radiation and weekly cisplatin. PET scanon 08/18/2020 revealed a 5.7 x 4.2 cm destructive hypermetabolic mass (SUV 02.54) involving the sphenoid sinus, skull base and posterior nasopharynx. This was consistent with patient's known squamous cell carcinoma. There was no locoregional lymphadenopathy or distant metastatic disease. He presented to ED on 10/23/2020 for treatment of severe odynophagia. Chest x-ray clear.    Assessment / Plan / Recommendation Clinical Impression  Pt didn't report any odynophagia when consuming puree, soft solids, potato chips, soft ham sandwich or thin liquids. At baseline, pt reports slower mastication of all food which was present during this evaluation. Of all the  textures presented, pt had greater difficulty with soft ham sandwich. Pt stated "it won't go down" but was not able to clarify sensation d/t accelerated anxiety. Pt was NOT CHOKING and was not any respiratory distress. He required extensive coaching to drink some water which aided in transit of bolus. Despite questions, he continued to state "it wouldn't go down" so it is difficult to differentiate oropharyngeal dysphagia from esophageal deficit. After this bolus, he continued consuming potato chips, applesauce and thin liquids via straw. At this time, recommend a dysphagia 2 diet with thin liquids, medicine whole with thin liquids. St to follow in next 2-3 days for diet toleration.   SLP Visit Diagnosis: Dysphagia, oropharyngeal phase (R13.12);Dysphagia, pharyngoesophageal phase (R13.14)    Aspiration Risk  Mild aspiration risk    Diet Recommendation Dysphagia 2 (Fine chop);Thin liquid   Liquid Administration via: Cup;Straw Medication Administration: Whole meds with liquid Supervision: Patient able to self feed Compensations: Minimize environmental distractions;Slow rate;Small sips/bites Postural Changes: Seated upright at 90 degrees    Other  Recommendations Oral Care Recommendations: Oral care BID   Follow up Recommendations None      Frequency and Duration min 1 x/week  1 week       Prognosis Prognosis for Safe Diet Advancement: Fair      Swallow Study   General Date of Onset: 10/25/20 HPI: The patient is a 74 y.o. gentleman with stage III nasopharyngeal carcinoma. He is s/p 6 weeks of concurrent radiation and weekly cisplatin. PET scanon 08/18/2020 revealed a 5.7 x 4.2 cm destructive hypermetabolic mass (SUV 27.06) involving  the sphenoid sinus, skull base and posterior nasopharynx. This was consistent with patient's known squamous cell carcinoma. There was no locoregional lymphadenopathy or distant metastatic disease. He presented to ED on 10/23/2020 for treatment of severe  odynophagia. Chest x-ray clear.  Type of Study: Bedside Swallow Evaluation Previous Swallow Assessment: none in chart Diet Prior to this Study:  (full liquid diet) Temperature Spikes Noted: No Respiratory Status: Room air History of Recent Intubation: No Behavior/Cognition: Alert;Cooperative;Requires cueing Oral Cavity Assessment: Lesions Oral Care Completed by SLP: No Oral Cavity - Dentition: Missing dentition Vision: Functional for self-feeding Self-Feeding Abilities: Able to feed self Patient Positioning: Upright in bed Baseline Vocal Quality: Normal Volitional Cough: Strong Volitional Swallow: Able to elicit    Oral/Motor/Sensory Function Overall Oral Motor/Sensory Function: Within functional limits   Ice Chips Ice chips: Not tested   Thin Liquid Thin Liquid: Within functional limits Presentation: Self Fed;Straw    Nectar Thick Nectar Thick Liquid: Not tested   Honey Thick Honey Thick Liquid: Not tested   Puree Puree: Within functional limits Presentation: Self Fed;Spoon   Solid     Solid: Impaired Presentation: Self Fed Other Comments:  (When consuming soft sandwich (bread) pt had difficulty )     Dericka Ostenson B. Rutherford Nail M.S., [place], [place] Office [phone]  Amery Minasyan 10/31/2020,5:17 PM

## 2020-11-01 DIAGNOSIS — R1310 Dysphagia, unspecified: Secondary | ICD-10-CM | POA: Diagnosis not present

## 2020-11-01 LAB — CBC WITH DIFFERENTIAL/PLATELET
Abs Immature Granulocytes: 0.03 10*3/uL (ref 0.00–0.07)
Basophils Absolute: 0 10*3/uL (ref 0.0–0.1)
Basophils Relative: 0 %
Eosinophils Absolute: 0 10*3/uL (ref 0.0–0.5)
Eosinophils Relative: 0 %
HCT: 25.2 % — ABNORMAL LOW (ref 39.0–52.0)
Hemoglobin: 8.4 g/dL — ABNORMAL LOW (ref 13.0–17.0)
Immature Granulocytes: 1 %
Lymphocytes Relative: 14 %
Lymphs Abs: 0.5 10*3/uL — ABNORMAL LOW (ref 0.7–4.0)
MCH: 30.7 pg (ref 26.0–34.0)
MCHC: 33.3 g/dL (ref 30.0–36.0)
MCV: 92 fL (ref 80.0–100.0)
Monocytes Absolute: 0.4 10*3/uL (ref 0.1–1.0)
Monocytes Relative: 12 %
Neutro Abs: 2.3 10*3/uL (ref 1.7–7.7)
Neutrophils Relative %: 73 %
Platelets: 136 10*3/uL — ABNORMAL LOW (ref 150–400)
RBC: 2.74 MIL/uL — ABNORMAL LOW (ref 4.22–5.81)
RDW: 15.5 % (ref 11.5–15.5)
WBC: 3.2 10*3/uL — ABNORMAL LOW (ref 4.0–10.5)
nRBC: 0 % (ref 0.0–0.2)

## 2020-11-01 LAB — COMPREHENSIVE METABOLIC PANEL
ALT: 12 U/L (ref 0–44)
AST: 20 U/L (ref 15–41)
Albumin: 2.8 g/dL — ABNORMAL LOW (ref 3.5–5.0)
Alkaline Phosphatase: 60 U/L (ref 38–126)
Anion gap: 8 (ref 5–15)
BUN: 6 mg/dL — ABNORMAL LOW (ref 8–23)
CO2: 25 mmol/L (ref 22–32)
Calcium: 7.8 mg/dL — ABNORMAL LOW (ref 8.9–10.3)
Chloride: 103 mmol/L (ref 98–111)
Creatinine, Ser: 0.62 mg/dL (ref 0.61–1.24)
GFR, Estimated: >60 mL/min (ref >60)
Glucose, Bld: 105 mg/dL — ABNORMAL HIGH (ref 70–99)
Potassium: 4.1 mmol/L (ref 3.5–5.1)
Sodium: 136 mmol/L (ref 135–145)
Total Bilirubin: 0.8 mg/dL (ref 0.3–1.2)
Total Protein: 5.9 g/dL — ABNORMAL LOW (ref 6.5–8.1)

## 2020-11-01 LAB — GLUCOSE, CAPILLARY
Glucose-Capillary: 93 mg/dL (ref 70–99)
Glucose-Capillary: 113 mg/dL — ABNORMAL HIGH (ref 70–99)

## 2020-11-01 LAB — MAGNESIUM: Magnesium: 1.6 mg/dL — ABNORMAL LOW (ref 1.7–2.4)

## 2020-11-01 LAB — PHOSPHORUS: Phosphorus: 2.1 mg/dL — ABNORMAL LOW (ref 2.5–4.6)

## 2020-11-01 MED ORDER — ENSURE ENLIVE PO LIQD: 237.0000 mL | Freq: Three times a day (TID) | ORAL | 12 refills | Status: DC

## 2020-11-01 MED ORDER — ADULT MULTIVITAMIN W/MINERALS CH: 1.0000 | ORAL_TABLET | Freq: Every day | ORAL | 0 refills | Status: AC

## 2020-11-01 MED ORDER — SALINE SPRAY 0.65 % NA SOLN: 1.0000 | NASAL | 0 refills | Status: DC | PRN

## 2020-11-01 MED ORDER — POLYVINYL ALCOHOL 1.4 % OP SOLN: 1.0000 [drp] | OPHTHALMIC | 0 refills | Status: DC | PRN

## 2020-11-01 MED ORDER — PHENOL 1.4 % MT LIQD: 1.0000 | OROMUCOSAL | 0 refills | Status: DC | PRN

## 2020-11-01 NOTE — Discharge Summary (Signed)
Kenneth Franklin JGG:[id number] DOB: 1946-05-17 DOA: 10/25/2020  PCP: Kenneth Lamb, NP  Admit date: 10/25/2020  Discharge date: 11/01/2020  Admitted From: Home   disposition: Home   Recommendations for Outpatient Follow-up:   Follow up with hematology and oncology in 1 week: Patient's phosphorus, potassium and magnesium need to be checked.   Home Health: N/A Equipment/Devices: None although he was discharged with home PT and home health RN. Consultations: Hematology oncology Discharge Condition: Much improved CODE STATUS: Full Diet Recommendation: Heart Healthy dysphagia 2 diet  Diet Order            Diet - low sodium heart healthy           DIET DYS 2 Room service appropriate? Yes; Fluid consistency: Thin  Diet effective now                  Chief Complaint  Patient presents with  . Heartburn     Brief history of present illness from the day of admission and additional interim summary     Kenneth Franklin is a 74 y.o. 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  States he has had liquid diet for the past 3 weeks because of the pain.  He received Carafate, Magic mouthwash with lidocaine by his oncologist without significant improvement.  He last saw Dr. Mike Gip on 11/23.  States he had some relief with IV hydration and viscous lidocaine on his ER visit 2 days prior.  When he is trying to stay hydrated drinking several bottles of water as well as Ensure during the day he reports feeling progressively weaker and does return to the  emergency room.  He does endorse nausea but denies vomiting, abdominal pain or change in bowel habits.  Denies dysuria.  Denies fever or chills, cough or shortness of breath. ED Course: On arrival, BP 126/105, pulse 106, afebrile, O2 sat 96% on room air.  Blood work for the most part unremarkable.  CBC with normal WBC, hemoglobin 10.6.  BMP unremarkable.  LFTs WNL.  Lipase 18, troponin was 5.  Chest x-ray showed no active cardiopulmonary disease.  EKG as reviewed by me : Sinus tachycardia at 102 with no acute ST-T wave changes. Patient given an IV fluid bolus, Viscous Lidocaine.  Hospitalization requested due to persistent symptoms.                                                                 Hospital Course   Patient's odynophagia and stomatitis and mucositis secondary to  XRT and chemotherapy was treated with supportive care and pain management.  Patient slowly but steadily improved.  He had decreased pain, improved swallowing and on day of discharge was able to eat solid foods without too much difficulty.  Patient had been evaluated by physical therapy who recommended patient be discharged to a rehab facility.  Patient however refused to go to rehab and was discharged home in the care of his brother per his and his brother's request.  Decreased p.o. intake secondary to stomatitis/mucositis and possible radiation esophagitis Patient much improved, speaking and eating more easily. Continue Magic mouthwash, viscous lidocaine, as needed IV morphine and Toradol Can try Biotene for increased lubrication Tolerating dysphagia 2 diet.  Eye discomfort Eye exam without evidence of foreign body or irritation Patient has known dry eyes Restart artificial tears and follow symptoms  Hypokalemia Normalized after repletion  Hypophosphatemia Almost normalized at 2.4, 2.5 being lower limits of normal To off on further K-Phos given potassium 4.7 today Can add K-Phos in the morning as  warranted  Hypomagnesemia Magnesium persistently low, but improving very slowly despite repletion We will give another 2 g of magnesium today and check in the morning.  Anxiety depression Continue mirtazapine, Lexapro, buspirone and Xanax per home doses  DM2 Blood sugars under reasonable control on present medications Nasopharyngeal CA Ongoing treatment per radiation oncology and hematology oncology       Discharge diagnosis     Principal Problem:   Painful swallowing Active Problems:   Anxiety and depression   Diabetes mellitus type 2, controlled, without complications (HCC)   Generalized weakness   Nasopharyngeal carcinoma (HCC)   Inadequate oral intake   Mucositis due to antineoplastic therapy   Mucositis due to radiation therapy   Odynophagia   Malnutrition of moderate degree    Discharge instructions    Discharge Instructions    Call MD for:  severe uncontrolled pain   Complete by: As directed    Diet - low sodium heart healthy   Complete by: As directed    Discharge instructions   Complete by: As directed    1.  Make an appointment to see your oncologist within 1 week to see how you are doing. 2.  Make an appointment to see your primary care physician in 1 week to make sure you are still doing well.   Increase activity slowly   Complete by: As directed       Discharge Medications   Allergies as of 11/01/2020      Reactions   Propoxyphene    Unknown reaction      Medication List    TAKE these medications   ALPRAZolam 0.25 MG tablet Commonly known as: XANAX TAKE 1 TABLET EVERY DAY AS NEEDED FOR ANXIETY What changed: See the new instructions.   aspirin EC 81 MG tablet Take 81 mg by mouth daily. Swallow whole.   atorvastatin 80 MG tablet Commonly known as: LIPITOR TAKE 1 TABLET (80 MG TOTAL) BY MOUTH NIGHTLY. What changed: See the new instructions.   busPIRone 10 MG tablet Commonly known as: BUSPAR TAKE 1 TABLET (10 MG TOTAL) BY MOUTH  2 (TWO) TIMES DAILY. What changed: when to take this   clopidogrel 75 MG tablet Commonly known as: PLAVIX TAKE 1 TABLET (75 MG TOTAL) BY MOUTH DAILY.   Denta 5000 Plus 1.1 % Crea dental cream Generic drug: sodium fluoride Place 1 application onto teeth at bedtime.   dexamethasone 4 MG tablet Commonly known as: DECADRON Take 2  tablets (8 mg total) by mouth daily. Take daily x 3 days starting the day after cisplatin chemotherapy. Take with food.   escitalopram 20 MG tablet Commonly known as: LEXAPRO Take 20 mg by mouth every morning.   escitalopram 10 MG tablet Commonly known as: LEXAPRO Take 1 tablet (10 mg total) by mouth daily. Stop your lexapro in a week   feeding supplement Liqd Take 237 mLs by mouth 3 (three) times daily between meals.   fluticasone 110 MCG/ACT inhaler Commonly known as: FLOVENT HFA Inhale 2 puffs into the lungs daily as needed (shortness of breath).   glipiZIDE 5 MG tablet Commonly known as: GLUCOTROL Take 5 mg by mouth daily before breakfast.   HYDROcodone-acetaminophen 10-325 MG tablet Commonly known as: NORCO Take 0.5 tablets by mouth every 4 (four) hours as needed for severe pain.   lidocaine 2 % solution Commonly known as: XYLOCAINE Use as directed 5 mLs in the mouth or throat every 6 (six) hours as needed for mouth pain.   lidocaine-prilocaine cream Commonly known as: EMLA Apply 1 application topically as needed. Apply small amount to port site at least 1 hour prior to it being accessed, cover with plastic wrap   losartan 100 MG tablet Commonly known as: COZAAR TAKE 1 TABLET (100 MG TOTAL) BY MOUTH DAILY. What changed: when to take this   magic mouthwash w/lidocaine Soln Take 5 mLs by mouth 4 (four) times daily as needed for mouth pain. Sig: Swish/Spit 5-10 ml four times a day as needed. Dispense 480 ml. 1RF   magnesium oxide 400 MG tablet Commonly known as: MAG-OX TAKE 1 TABLET BY MOUTH EVERY DAY   metFORMIN 500 MG  tablet Commonly known as: GLUCOPHAGE Take 1 tablet (500 mg total) by mouth daily with breakfast.   mirtazapine 30 MG tablet Commonly known as: REMERON Take 1 tablet (30 mg total) by mouth at bedtime. What changed: how much to take   multivitamin with minerals Tabs tablet Take 1 tablet by mouth daily. Start taking on: November 02, 2020   nystatin cream Commonly known as: MYCOSTATIN Apply 1 application topically 2 (two) times daily as needed (yeast).   ondansetron 8 MG tablet Commonly known as: Zofran Take 1 tablet (8 mg total) by mouth 2 (two) times daily as needed. Start on the third day after cisplatin chemotherapy.   pantoprazole 20 MG tablet Commonly known as: PROTONIX Take 20 mg by mouth daily before lunch.   phenol 1.4 % Liqd Commonly known as: CHLORASEPTIC Use as directed 1 spray in the mouth or throat as needed for throat irritation / pain.   polyvinyl alcohol 1.4 % ophthalmic solution Commonly known as: LIQUIFILM TEARS Place 1 drop into both eyes as needed for dry eyes.   prochlorperazine 10 MG tablet Commonly known as: COMPAZINE Take 1 tablet (10 mg total) by mouth every 6 (six) hours as needed (Nausea or vomiting).   sitaGLIPtin 100 MG tablet Commonly known as: JANUVIA Take 100 mg by mouth every morning.   sodium chloride 0.65 % Soln nasal spray Commonly known as: OCEAN Place 1 spray into both nostrils as needed for congestion.   sodium fluoride 1.1 (0.5 F) MG/ML Soln Commonly known as: LURIDE Take 1 drop by mouth at bedtime. One dental application at bedtime.   sucralfate 1 g tablet Commonly known as: Carafate Take 1 tablet (1 g total) by mouth 4 (four) times daily.   sucralfate 1 g tablet Commonly known as: Carafate Take 1 tablet (1 g total)  by mouth 3 (three) times daily. Dissolve in 3-4 tbsp warm water, swish and swallow.   VITAMIN D PO Take 1 capsule by mouth daily.         Major procedures and Radiology Reports - PLEASE review detailed  and final reports thoroughly  -        DG Chest 2 View  Result Date: 10/24/2020 CLINICAL DATA:  Chest pain, burning in throat. Chemo radiation therapy. EXAM: CHEST - 2 VIEW COMPARISON:  Chest x-ray 10/23/2020, PET CT 08/18/2020 FINDINGS: Right chest wall Port-A-Cath with tip overlying the expected region of the superior cavoatrial junction. The heart size and mediastinal contours are within normal limits. Aortic arch calcifications. Subcentimeter calcified left upper lobe granuloma again noted but not as conspicuous due to overlying vasculature. No focal consolidation. No pulmonary edema. No pleural effusion. No pneumothorax. No acute osseous abnormality. Multilevel degenerative changes of the spine. IMPRESSION: No active cardiopulmonary disease. Electronically Signed   By: Iven Finn M.D.   On: 10/24/2020 21:30   DG Chest 2 View  Result Date: 10/23/2020 CLINICAL DATA:  Cough and dysphagia EXAM: CHEST - 2 VIEW COMPARISON:  01/13/2015 FINDINGS: Right-sided central venous port tip over the SVC. No focal opacity or pleural effusion. Normal heart size. Aortic atherosclerosis. No pneumothorax. Metallic coin or artifact projects over the left upper quadrant on frontal view. IMPRESSION: No active cardiopulmonary disease. Electronically Signed   By: Donavan Foil M.D.   On: 10/23/2020 19:11    Micro Results    Recent Results (from the past 240 hour(s))  Resp Panel by RT-PCR (Flu A&B, Covid) Nasopharyngeal Swab     Status: None   Collection Time: 10/25/20  4:07 AM   Specimen: Nasopharyngeal Swab; Nasopharyngeal(NP) swabs in vial transport medium  Result Value Ref Range Status   SARS Coronavirus 2 by RT PCR NEGATIVE NEGATIVE Final    Comment: (NOTE) SARS-CoV-2 target nucleic acids are NOT DETECTED.  The SARS-CoV-2 RNA is generally detectable in upper respiratory specimens during the acute phase of infection. The lowest concentration of SARS-CoV-2 viral copies this assay can detect is 138  copies/mL. A negative result does not preclude SARS-Cov-2 infection and should not be used as the sole basis for treatment or other patient management decisions. A negative result may occur with  improper specimen collection/handling, submission of specimen other than nasopharyngeal swab, presence of viral mutation(s) within the areas targeted by this assay, and inadequate number of viral copies(<138 copies/mL). A negative result must be combined with clinical observations, patient history, and epidemiological information. The expected result is Negative.  Fact Sheet for Patients:  EntrepreneurPulse.com.au  Fact Sheet for Healthcare Providers:  IncredibleEmployment.be  This test is no t yet approved or cleared by the [place] FDA and  has been authorized for detection and/or diagnosis of SARS-CoV-2 by FDA under an Emergency Use Authorization (EUA). This EUA will remain  in effect (meaning this test can be used) for the duration of the COVID-19 declaration under Section 564(b)(1) of the Act, 21 U.S.C.section 360bbb-3(b)(1), unless the authorization is terminated  or revoked sooner.       Influenza A by PCR NEGATIVE NEGATIVE Final   Influenza B by PCR NEGATIVE NEGATIVE Final    Comment: (NOTE) The Xpert Xpress SARS-CoV-2/FLU/RSV plus assay is intended as an aid in the diagnosis of influenza from Nasopharyngeal swab specimens and should not be used as a sole basis for treatment. Nasal washings and aspirates are unacceptable for Xpert Xpress SARS-CoV-2/FLU/RSV testing.  Fact Sheet  for Patients: EntrepreneurPulse.com.au  Fact Sheet for Healthcare Providers: IncredibleEmployment.be  This test is not yet approved or cleared by the [place] FDA and has been authorized for detection and/or diagnosis of SARS-CoV-2 by FDA under an Emergency Use Authorization (EUA). This EUA will remain in effect (meaning  this test can be used) for the duration of the COVID-19 declaration under Section 564(b)(1) of the Act, 21 U.S.C. section 360bbb-3(b)(1), unless the authorization is terminated or revoked.  Performed at [facility], [address], [place], [place] [address]     Today   Subjective    Kenneth Franklin feels much improved since admission.  Feels ready to go home.  Denies chest pain, shortness of breath or abdominal pain.  Feels they can take care of themselves with the resources they have at home.  Objective   Blood pressure (!) 150/82, pulse 79, temperature 97.9 F (36.6 C), resp. rate 15, height 6' (1.829 m), weight 74.5 kg, SpO2 100 %.   Intake/Output Summary (Last 24 hours) at 11/01/2020 1756 Last data filed at 11/01/2020 1551 Gross per 24 hour  Intake 750 ml  Output 1930 ml  Net -1180 ml    Exam General: Patient appears well and in good spirits sitting up in bed in no acute distress.  Eyes: sclera anicteric, conjuctiva mild injection bilaterally CVS: S1-S2, regular  Respiratory:  decreased air entry bilaterally secondary to decreased inspiratory effort, rales at bases  GI: NABS, soft, NT  LE: No edema.  Neuro: A/O x 3, Moving all extremities equally with normal strength, CN 3-12 intact, grossly nonfocal.  Psych: patient is logical and coherent, judgement and insight appear normal, mood and affect appropriate to situation.    Data Review   CBC w Diff:  Lab Results  Component Value Date   WBC 3.2 (L) 11/01/2020   HGB 8.4 (L) 11/01/2020   HCT 25.2 (L) 11/01/2020   PLT 136 (L) 11/01/2020   LYMPHOPCT 14 11/01/2020   MONOPCT 12 11/01/2020   EOSPCT 0 11/01/2020   BASOPCT 0 11/01/2020    CMP:  Lab Results  Component Value Date   NA 136 11/01/2020   NA 137 12/15/2015   K 4.1 11/01/2020   CL 103 11/01/2020   CO2 25 11/01/2020   BUN 6 (L) 11/01/2020   BUN 8 12/15/2015   CREATININE 0.62 11/01/2020   CREATININE 1.07 06/16/2016   PROT 5.9 (L) 11/01/2020    PROT 6.5 12/15/2015   ALBUMIN 2.8 (L) 11/01/2020   ALBUMIN 4.2 12/15/2015   BILITOT 0.8 11/01/2020   BILITOT 0.5 12/15/2015   ALKPHOS 60 11/01/2020   AST 20 11/01/2020   ALT 12 11/01/2020  .   Total Time in preparing paper work, data evaluation and todays exam - 35 minutes  Vashti Hey M.D on 11/01/2020 at 5:56 PM  [facility]   Office  [phone]

## 2020-11-01 NOTE — TOC Transition Note (Addendum)
Transition of Care [facility]) - CM/SW Discharge Note   Patient Details  Name: Kenneth Franklin MRN: [id number] Date of Birth: 08-Sep-1946  Transition of Care [facility]) CM/SW Contact:  Tiajuana Amass [place], [place] Phone Number: [phone] 11/01/2020, 12:07 PM   Clinical Narrative: [facility] team spoke with patient about discharge disposition. Pt was from SNF rehab prior to admission but desires to return to home. Will discuss with the multidisciplinary team. 1300- Attending has ordered home with Home Health PT. Spoke with patient about discharge plan. He is in agreement with the plan for Home Health PT services. 1400- Call to [facility])- [phone] to set up [place] services. 1500- Discussed discharge plan with patient to receive services from [place]. Case Manager thought patient would benefit from RN. Will have RN services added to order.       Barriers to Discharge: Continued Medical Work up   Patient Goals and CMS Choice Patient states their goals for this hospitalization and ongoing recovery are:: to return home CMS Medicare.gov Compare Post Acute Care list provided to:: Patient Choice offered to / list presented to : Patient  Discharge Placement                       Discharge Plan and Services                                     Social Determinants of Health (SDOH) Interventions     Readmission Risk Interventions Readmission Risk Prevention Plan 10/27/2020  Transportation Screening Complete  PCP or Specialist Appt within 3-5 Days Complete  HRI or [place] Complete  Social Work Consult for [place] Planning/Counseling Complete  Palliative Care Screening Not Applicable  Medication Review Press photographer) Complete  Some recent data might be hidden

## 2020-11-01 NOTE — Plan of Care (Signed)
  Problem: Education: Goal: Knowledge of General Education information will improve Description: Including pain rating scale, medication(s)/side effects and non-pharmacologic comfort measures Outcome: Adequate for Discharge   Problem: Health Behavior/Discharge Planning: Goal: Ability to manage health-related needs will improve Outcome: Adequate for Discharge   Problem: Clinical Measurements: Goal: Ability to maintain clinical measurements within normal limits will improve Outcome: Adequate for Discharge Goal: Will remain free from infection Outcome: Adequate for Discharge Goal: Diagnostic test results will improve Outcome: Adequate for Discharge Goal: Respiratory complications will improve Outcome: Adequate for Discharge Goal: Cardiovascular complication will be avoided Outcome: Adequate for Discharge   Problem: Activity: Goal: Risk for activity intolerance will decrease Outcome: Adequate for Discharge   Problem: Nutrition: Goal: Adequate nutrition will be maintained Outcome: Adequate for Discharge   Problem: Coping: Goal: Level of anxiety will decrease Outcome: Adequate for Discharge   Problem: Pain Managment: Goal: General experience of comfort will improve Outcome: Adequate for Discharge

## 2020-11-03 ENCOUNTER — Telehealth: Payer: Self-pay | Admitting: Hematology and Oncology

## 2020-11-03 NOTE — Progress Notes (Incomplete)
[facility]  [address], [address] [place], [place] [address] Phone: [phone]  Fax: [phone]   Clinic Day:  11/03/2020  Referring physician: Verita Lamb, NP  Chief Complaint: Kenneth Franklin is a 74 y.o. male with stage III nasopharyngeal carcinoma who is seen after hospitalization.  HPI: The patient was last seen in the oncology clinic on 10/20/2020. At that time, he had been having a lot of mouth pain and burning on the right side. He could not taste anything. He drank 4-5 Boosts or Ensures and 5 bottles of water per day. He denief any problems with his hearing. Shortness of breath was stable. He had rare headaches. Hematocrit was 34.8, hemoglobin 11.5, MCV 91.3, platelets 215,000, WBC 5,200. Sodium was 132. Chloride was 95. Magnesium was 1.4. He received week #6 cisplatin with potassium 20 mEq and 2 g IV magnesium.  The patient was admitted to [facility] from 10/25/2020 - 11/01/2020 for pain with swallowing. He had been on a liquid diet for 3 weeks. He was given IV fluids and viscous lidocaine in the ER. Once admitted, he was treated with supportive care and pain management. At discharge, he was able to eat solid foods without too much difficulty. He was to follow up with hematology oncology.  Labs followed: 10/25/2020: Hematocrit 28.5, hemoglobin   9.6, MCV 92.5, platelets 155,000, WBC 4,700. 10/27/2020: Hematocrit 30.6, hemoglobin 10.3, MCV 92.4, platelets 190,000, WBC 4,400. Ferritin 471. Iron saturation 19%. TIBC 221. Vitamin B12 242. Folate 13.1. 11/01/2020: Hematocrit 25.2, hemoglobin   8.4, MCV 92.0, platelets 136,000, WBC 3,200 (ANC 2,300).  During the interim, ***   Past Medical History:  Diagnosis Date  . Acute ischemic stroke ([place]) 2017  . Alcohol abuse   . Anxiety   . Cancer ([place])   . Depression   . Diabetes mellitus without complication ([place])   . Dyspnea    pcp knows and ordered rescue inhaler  . Hyperlipidemia   . Hypertension   .  Skin cancer    Squamous Cell Carcinoma In Situ    Past Surgical History:  Procedure Laterality Date  . CATARACT EXTRACTION Bilateral   . COLONOSCOPY    . NASOPHARYNGOSCOPY N/A 08/12/2020   Procedure: ENDOSCOPIC NASOPHARYNGOSCOPY WITH BIOPSY;  Surgeon: Margaretha Sheffield, MD;  Location: [facility] ORS;  Service: ENT;  Laterality: N/A;  . PORTA CATH INSERTION N/A 08/24/2020   Procedure: PORTA CATH INSERTION;  Surgeon: Algernon Huxley, MD;  Location: [facility];  Service: Cardiovascular;  Laterality: N/A;    Family History  Problem Relation Age of Onset  . Diabetes Brother     Social History:  reports that he quit smoking about 14 months ago. His smoking use included cigars and cigarettes. He has a 40.00 pack-year smoking history. He has never used smokeless tobacco. He reports previous alcohol use. He reports that he does not use drugs. He smoked 1 pack/day x 40 years.  He denies exposure of radiation or toxins; he did work with asphalt. He lives alone in [place]. He is widowed. The patient is*** alone today.  Allergies:  Allergies  Allergen Reactions  . Propoxyphene     Unknown reaction    Current Medications: Current Outpatient Medications  Medication Sig Dispense Refill  . ALPRAZolam (XANAX) 0.25 MG tablet TAKE 1 TABLET EVERY DAY AS NEEDED FOR ANXIETY (Patient taking differently: Take 0.25 mg by mouth daily as needed for anxiety. ) 30 tablet 2  . aspirin EC 81 MG tablet Take 81  mg by mouth daily. Swallow whole.    Marland Kitchen atorvastatin (LIPITOR) 80 MG tablet TAKE 1 TABLET (80 MG TOTAL) BY MOUTH NIGHTLY. (Patient taking differently: Take 80 mg by mouth every evening. ) 90 tablet 0  . busPIRone (BUSPAR) 10 MG tablet TAKE 1 TABLET (10 MG TOTAL) BY MOUTH 2 (TWO) TIMES DAILY. (Patient taking differently: Take 10 mg by mouth at bedtime. ) 180 tablet 0  . clopidogrel (PLAVIX) 75 MG tablet TAKE 1 TABLET (75 MG TOTAL) BY MOUTH DAILY. 90 tablet 0  . DENTA 5000 PLUS 1.1 % CREA dental cream Place 1  application onto teeth at bedtime.     Marland Kitchen dexamethasone (DECADRON) 4 MG tablet Take 2 tablets (8 mg total) by mouth daily. Take daily x 3 days starting the day after cisplatin chemotherapy. Take with food. 30 tablet 1  . escitalopram (LEXAPRO) 10 MG tablet Take 1 tablet (10 mg total) by mouth daily. Stop your lexapro in a week 6 tablet 0  . escitalopram (LEXAPRO) 20 MG tablet Take 20 mg by mouth every morning.     . feeding supplement (ENSURE ENLIVE / ENSURE PLUS) LIQD Take 237 mLs by mouth 3 (three) times daily between meals. 237 mL 12  . fluticasone (FLOVENT HFA) 110 MCG/ACT inhaler Inhale 2 puffs into the lungs daily as needed (shortness of breath).    Marland Kitchen glipiZIDE (GLUCOTROL) 5 MG tablet Take 5 mg by mouth daily before breakfast.     . HYDROcodone-acetaminophen (NORCO) 10-325 MG tablet Take 0.5 tablets by mouth every 4 (four) hours as needed for severe pain. 30 tablet 0  . lidocaine (XYLOCAINE) 2 % solution Use as directed 5 mLs in the mouth or throat every 6 (six) hours as needed for mouth pain. 100 mL 0  . lidocaine-prilocaine (EMLA) cream Apply 1 application topically as needed. Apply small amount to port site at least 1 hour prior to it being accessed, cover with plastic wrap 30 g 1  . losartan (COZAAR) 100 MG tablet TAKE 1 TABLET (100 MG TOTAL) BY MOUTH DAILY. (Patient taking differently: Take 100 mg by mouth every morning. ) 90 tablet 0  . magic mouthwash w/lidocaine SOLN Take 5 mLs by mouth 4 (four) times daily as needed for mouth pain. Sig: Swish/Spit 5-10 ml four times a day as needed. Dispense 480 ml. 1RF (Patient not taking: Reported on 10/25/2020) 480 mL 1  . magnesium oxide (MAG-OX) 400 MG tablet TAKE 1 TABLET BY MOUTH EVERY DAY (Patient not taking: Reported on 10/25/2020) 30 tablet 0  . metFORMIN (GLUCOPHAGE) 500 MG tablet Take 1 tablet (500 mg total) by mouth daily with breakfast. 90 tablet 0  . mirtazapine (REMERON) 30 MG tablet Take 1 tablet (30 mg total) by mouth at bedtime.  (Patient taking differently: Take 15 mg by mouth at bedtime. ) 30 tablet 0  . Multiple Vitamin (MULTIVITAMIN WITH MINERALS) TABS tablet Take 1 tablet by mouth daily. 30 tablet 0  . nystatin cream (MYCOSTATIN) Apply 1 application topically 2 (two) times daily as needed (yeast).     . ondansetron (ZOFRAN) 8 MG tablet Take 1 tablet (8 mg total) by mouth 2 (two) times daily as needed. Start on the third day after cisplatin chemotherapy. 30 tablet 1  . pantoprazole (PROTONIX) 20 MG tablet Take 20 mg by mouth daily before lunch.     . phenol (CHLORASEPTIC) 1.4 % LIQD Use as directed 1 spray in the mouth or throat as needed for throat irritation / pain.  0  .  polyvinyl alcohol (LIQUIFILM TEARS) 1.4 % ophthalmic solution Place 1 drop into both eyes as needed for dry eyes. 15 mL 0  . prochlorperazine (COMPAZINE) 10 MG tablet Take 1 tablet (10 mg total) by mouth every 6 (six) hours as needed (Nausea or vomiting). 30 tablet 1  . sitaGLIPtin (JANUVIA) 100 MG tablet Take 100 mg by mouth every morning.     . sodium chloride (OCEAN) 0.65 % SOLN nasal spray Place 1 spray into both nostrils as needed for congestion.  0  . sodium fluoride (LURIDE) 1.1 (0.5 F) MG/ML SOLN Take 1 drop by mouth at bedtime. One dental application at bedtime.    . sucralfate (CARAFATE) 1 g tablet Take 1 tablet (1 g total) by mouth 4 (four) times daily. 120 tablet 11  . sucralfate (CARAFATE) 1 g tablet Take 1 tablet (1 g total) by mouth 3 (three) times daily. Dissolve in 3-4 tbsp warm water, swish and swallow. 90 tablet 3  . VITAMIN D PO Take 1 capsule by mouth daily.     No current facility-administered medications for this visit.    Review of Systems  Constitutional: Positive for weight loss (2 lbs). Negative for chills, diaphoresis, fever and malaise/fatigue.       Feels "alright."  HENT: Negative for congestion, ear discharge, ear pain, hearing loss, nosebleeds, sinus pain, sore throat and tinnitus.        No sense of taste. Pain  and burning on right side of mouth. No problems with hearing.  Eyes: Negative for blurred vision.  Respiratory: Positive for shortness of breath (on exertion, stable). Negative for cough, hemoptysis and sputum production.   Cardiovascular: Negative.  Negative for chest pain, palpitations and leg swelling.  Gastrointestinal: Negative for abdominal pain, blood in stool, constipation, diarrhea, heartburn, melena, nausea and vomiting.       Drinks Boost/Ensure 4-5 x per day. Drinks 5 bottles of water per day.  Genitourinary: Negative.  Negative for dysuria, frequency, hematuria and urgency.       Urinates regularly throughout the day.  Musculoskeletal: Negative.  Negative for back pain, joint pain, myalgias and neck pain.  Skin: Negative for itching and rash.  Neurological: Positive for headaches (rare). Negative for dizziness, tingling, sensory change and weakness.  Endo/Heme/Allergies: Negative.  Does not bruise/bleed easily.  Psychiatric/Behavioral: Negative.  Negative for depression and memory loss. The patient is not nervous/anxious and does not have insomnia.   All other systems reviewed and are negative.  Performance status (ECOG): 1***  Vitals There were no vitals taken for this visit.   Physical Exam Vitals and nursing note reviewed.  Constitutional:      General: He is not in acute distress.    Appearance: Normal appearance. He is well-developed.     Interventions: Face mask in place.     Comments: Gentleman sitting comfortably in wheelchair in no acute distress. Able to get onto table for exam.  HENT:     Head: Normocephalic and atraumatic.     Comments: Cap. Gray hair.    Right Ear: Hearing normal.     Left Ear: Hearing normal.     Mouth/Throat:     Mouth: Mucous membranes are dry. No oral lesions.     Comments: Pinkness on hard palate (improved). Eyes:     General: No scleral icterus.    Extraocular Movements: Extraocular movements intact.     Conjunctiva/sclera:  Conjunctivae normal.     Pupils: Pupils are equal, round, and reactive to light.  Comments: Eye irritation has resolved.  Cardiovascular:     Rate and Rhythm: Normal rate and regular rhythm.     Heart sounds: Normal heart sounds. No murmur heard.  No friction rub. No gallop.   Pulmonary:     Effort: Pulmonary effort is normal.     Breath sounds: Normal breath sounds. No wheezing, rhonchi or rales.  Abdominal:     General: Bowel sounds are normal. There is no distension.     Palpations: Abdomen is soft. There is no hepatomegaly, splenomegaly or mass.     Tenderness: There is no abdominal tenderness. There is no guarding or rebound.  Musculoskeletal:        General: No tenderness. Normal range of motion.     Cervical back: Normal range of motion and neck supple.  Lymphadenopathy:     Head:     Right side of head: No preauricular, posterior auricular or occipital adenopathy.     Left side of head: No preauricular, posterior auricular or occipital adenopathy.     Cervical: No cervical adenopathy.     Upper Body:     Right upper body: No supraclavicular or axillary adenopathy.     Left upper body: No supraclavicular or axillary adenopathy.     Lower Body: No right inguinal adenopathy. No left inguinal adenopathy.  Skin:    General: Skin is warm and dry.     Findings: No bruising, erythema, lesion or rash.     Comments: Ruddy erythema on neck s/p radiation.  Neurological:     Mental Status: He is alert and oriented to person, place, and time.  Psychiatric:        Behavior: Behavior normal.        Thought Content: Thought content normal.        Judgment: Judgment normal.    No visits with results within 3 Day(s) from this visit.  Latest known visit with results is:  Admission on 08/12/2020, Discharged on 08/12/2020  Component Date Value Ref Range Status  . Sodium 08/12/2020 132* 135 - 145 mmol/L Final  . Potassium 08/12/2020 4.2  3.5 - 5.1 mmol/L Final  . Chloride 08/12/2020  96* 98 - 111 mmol/L Final  . CO2 08/12/2020 27  22 - 32 mmol/L Final  . Glucose, Bld 08/12/2020 374* 70 - 99 mg/dL Final   Glucose reference range applies only to samples taken after fasting for at least 8 hours.  . BUN 08/12/2020 7* 8 - 23 mg/dL Final  . Creatinine, Ser 08/12/2020 0.84  0.61 - 1.24 mg/dL Final  . Calcium 08/12/2020 8.5* 8.9 - 10.3 mg/dL Final  . GFR calc non Af Amer 08/12/2020 >60  >60 mL/min Final  . GFR calc Af Amer 08/12/2020 >60  >60 mL/min Final  . Anion gap 08/12/2020 9  5 - 15 Final   Performed at [facility], [address] [place], [place] [address]  . WBC 08/12/2020 5.5  4.0 - 10.5 K/uL Final  . RBC 08/12/2020 4.46  4.22 - 5.81 MIL/uL Final  . Hemoglobin 08/12/2020 13.2  13.0 - 17.0 g/dL Final  . HCT 08/12/2020 39.3  39 - 52 % Final  . MCV 08/12/2020 88.1  80.0 - 100.0 fL Final  . MCH 08/12/2020 29.6  26.0 - 34.0 pg Final  . MCHC 08/12/2020 33.6  30.0 - 36.0 g/dL Final  . RDW 08/12/2020 12.6  11.5 - 15.5 % Final  . Platelets 08/12/2020 178  150 - 400 K/uL Final  .  nRBC 08/12/2020 0.0  0.0 - 0.2 % Final   Performed at [facility], [address] [place], [place] [address]  . Glucose-Capillary 08/12/2020 368* 70 - 99 mg/dL Final   Glucose reference range applies only to samples taken after fasting for at least 8 hours.  . Glucose-Capillary 08/12/2020 333* 70 - 99 mg/dL Final   Glucose reference range applies only to samples taken after fasting for at least 8 hours.  . SURGICAL PATHOLOGY 08/12/2020    Final-Edited                   Value:SURGICAL PATHOLOGY CASE: [id number] PATIENT: Kenneth Franklin Surgical Pathology Report  Specimen Submitted: A. Nasopharyngeal tumor  Clinical History: Malignant neoplasm nasopharynx, sphenoid sinus.  DIAGNOSIS: A. NASOPHARYNGEAL TUMOR; BIOPSY: - KERATINIZING SQUAMOUS CELL CARCINOMA, MODERATELY DIFFERENTIATED.  GROSS DESCRIPTION: Intraoperative Consultation:     Labeled: Nasopharyngeal tumor      Received: Fresh     Specimen: Nasopharyngoscopy with biopsy     Pathologic evaluation performed: Frozen section diagnosis     Diagnosis: FSA, representative section: Squamous cell carcinoma     Communicated to: Called to Dr. Kathyrn Sheriff at 11:36 AM on 08/12/2020 Quay Burow M.D.     Tissue submitted: A touch preparation with 1 Diff-Quik stained slide is performed.  Additionally, representative sections of the specimen are frozen as FSA1 and 3 frozen section slides are performed.  A. Labeled: Nasopharyngeal tumor Received: Fresh Tissue fragment(s): Multiple                          Size: Aggregate, 1.4 x 1.4 x 0.4 cm Description: Received fresh on a Telfa pad are fragments of pink soft tissue and blood clot.  A touch preparation with 1 Diff-Quik stained slide is performed.  Representative sections are frozen as FSA1 with 3 frozen section slides performed.  The frozen section remnant is submitted in cassette 1 and the remainder of the specimen is submitted in cassette 2.  Final Diagnosis performed by Quay Burow, MD.   Electronically signed 08/13/2020 9:25:55AM The electronic signature indicates that the named Attending Pathologist has evaluated the specimen Technical component performed at [facility], [address], [place], [place] [address] Lab: [phone] Dir: Rush Farmer, MD, MMM  Professional component performed at [facility], [facility], [place], [place], [place] [address] Lab: [phone] Dir: Dellia Nims. Rubinas, MD  . Glucose-Capillary 08/12/2020 275* 70 - 99 mg/dL Final   Glucose reference range applies only to samples taken after fasting for at least 8 hours.  . Glucose-Capillary 08/12/2020 246* 70 - 99 mg/dL Final   Glucose reference range applies only to samples taken after fasting for at least 8 hours.    Assessment:  Kenneth Franklin is a 74 y.o. 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  Pathology revealed  keratinizing squamous cell carcinoma, moderately differentiated.   Head MRI on 08/13/2020 revealed a 5.9 cm nasopharyngeal mass consistent with known nasopharyngeal carcinoma with involvement of the clivus and sphenoid sinus. There was moderate chronic small vessel ischemic disease and cerebral atrophy.  There was no intracranial extension.  PET scan on 08/18/2020 revealed a 5.7 x 4.2 cm destructive hypermetabolic mass (SUV 75.64) involving the sphenoid sinus, skull base and posterior nasopharynx.  This was consistent with patient's known squamous cell carcinoma. There was no locoregional lymphadenopathy or distant metastatic disease.  He began IMRT on 09/08/2020.  He is s/p 4 weeks of cisplatin (  09/08/2020 - 09/29/2020; 10/13/2020).  He did not receive treatment on 10/06/2020 secondary to poor urine output.  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  Symptomatically, ***  Plan: 1.   Labs today: ***   2.   Clinical [id number] nasopharyngeal carcinoma  Tumor is unresectable.  PET scan revealed no evidence of distant disease  Treatment includes upfront radiation and chemotherapy followed by adjuvant chemotherapy   Cisplatin 40 mg/m2 weekly x 6-7 with radiation followed by cisplatin 80 mg/m2 D1 + 5FU 1000 mg/m2/day CI D1-4 q 28 days x 3 cycles  Baseline audiogram confirmed hearing loss associated with prior exposure.  Symptomatically, he has mucositis secondary to radiation.   He denies any nausea.  He is voiding well.  He is s/p 5 weeks of cisplatin with radiation.   Radiation ends on 10/28/2020  Labs reviewed.  Begin week #6 cisplatin and radiation  Discuss symptom management.  He has antiemetics and pain medications at home to use on a prn bases.  Interventions are adequate.    3.   Mucositis  Exam reveals mucositis involving hard palate.  Pain controlled with hydrocodone with  Tylenol.   Refill hydrocodone-acetaminophen (Norco) 10-325 mg 1/2 tablet po q 4 hours as needed for pain.  Patient notes no relief with Carafate.  Rx: Magic mouthwash. 4.   Hypomagnesemia  Magnesium 1.4.  Magnesium 2 gm IV. 5.   Week #6 cisplatin today. 6.   Assess calcium content in Ensure/Boost. 7.   RTC in 1 week for MD assessment, labs (CBC with diff, CMP, Mg), and week #7 cisplatin.  I discussed the assessment and treatment plan with the patient.  The patient was provided an opportunity to ask questions and all were answered.  The patient agreed with the plan and demonstrated an understanding of the instructions.  The patient was advised to call back if the symptoms worsen or if the condition fails to improve as anticipated.   Melissa C. Mike Gip, MD, PhD    11/03/2020, 1:18 PM  I, Mirian Mo Tufford, am acting as Education administrator for [employer]. Mike Gip, MD, PhD.  I, Melissa C. Mike Gip, MD, have reviewed the above documentation for accuracy and completeness, and I agree with the above.

## 2020-11-04 ENCOUNTER — Other Ambulatory Visit: Payer: Self-pay | Admitting: *Deleted

## 2020-11-04 ENCOUNTER — Telehealth: Payer: Self-pay | Admitting: Hematology and Oncology

## 2020-11-04 ENCOUNTER — Other Ambulatory Visit: Payer: Self-pay | Admitting: Hematology and Oncology

## 2020-11-04 ENCOUNTER — Inpatient Hospital Stay: Payer: Medicare Other | Admitting: Hematology and Oncology

## 2020-11-04 ENCOUNTER — Ambulatory Visit: Payer: Medicare Other

## 2020-11-04 ENCOUNTER — Inpatient Hospital Stay: Payer: Medicare Other

## 2020-11-04 DIAGNOSIS — E876 Hypokalemia: Secondary | ICD-10-CM

## 2020-11-04 DIAGNOSIS — C119 Malignant neoplasm of nasopharynx, unspecified: Secondary | ICD-10-CM

## 2020-11-04 MED ORDER — LIDOCAINE VISCOUS HCL 2 % MT SOLN: 5.0000 mL | Freq: Four times a day (QID) | OROMUCOSAL | 0 refills | Status: DC | PRN

## 2020-11-09 ENCOUNTER — Inpatient Hospital Stay: Payer: Medicare Other | Attending: Hematology and Oncology

## 2020-11-09 DIAGNOSIS — Z7952 Long term (current) use of systemic steroids: Secondary | ICD-10-CM | POA: Insufficient documentation

## 2020-11-09 DIAGNOSIS — Z87891 Personal history of nicotine dependence: Secondary | ICD-10-CM | POA: Insufficient documentation

## 2020-11-09 DIAGNOSIS — Z923 Personal history of irradiation: Secondary | ICD-10-CM | POA: Insufficient documentation

## 2020-11-09 DIAGNOSIS — Z125 Encounter for screening for malignant neoplasm of prostate: Secondary | ICD-10-CM | POA: Insufficient documentation

## 2020-11-09 DIAGNOSIS — E860 Dehydration: Secondary | ICD-10-CM | POA: Insufficient documentation

## 2020-11-09 DIAGNOSIS — I10 Essential (primary) hypertension: Secondary | ICD-10-CM | POA: Insufficient documentation

## 2020-11-09 DIAGNOSIS — Z79899 Other long term (current) drug therapy: Secondary | ICD-10-CM | POA: Insufficient documentation

## 2020-11-09 DIAGNOSIS — Z7982 Long term (current) use of aspirin: Secondary | ICD-10-CM | POA: Insufficient documentation

## 2020-11-09 DIAGNOSIS — Z8673 Personal history of transient ischemic attack (TIA), and cerebral infarction without residual deficits: Secondary | ICD-10-CM | POA: Insufficient documentation

## 2020-11-09 DIAGNOSIS — Z833 Family history of diabetes mellitus: Secondary | ICD-10-CM | POA: Insufficient documentation

## 2020-11-09 DIAGNOSIS — C113 Malignant neoplasm of anterior wall of nasopharynx: Secondary | ICD-10-CM | POA: Insufficient documentation

## 2020-11-09 DIAGNOSIS — Z9221 Personal history of antineoplastic chemotherapy: Secondary | ICD-10-CM | POA: Insufficient documentation

## 2020-11-09 DIAGNOSIS — Z7984 Long term (current) use of oral hypoglycemic drugs: Secondary | ICD-10-CM | POA: Insufficient documentation

## 2020-11-09 DIAGNOSIS — F418 Other specified anxiety disorders: Secondary | ICD-10-CM | POA: Insufficient documentation

## 2020-11-09 DIAGNOSIS — Z85828 Personal history of other malignant neoplasm of skin: Secondary | ICD-10-CM | POA: Insufficient documentation

## 2020-11-09 DIAGNOSIS — T451X5A Adverse effect of antineoplastic and immunosuppressive drugs, initial encounter: Secondary | ICD-10-CM | POA: Insufficient documentation

## 2020-11-09 DIAGNOSIS — Z7951 Long term (current) use of inhaled steroids: Secondary | ICD-10-CM | POA: Insufficient documentation

## 2020-11-09 DIAGNOSIS — E785 Hyperlipidemia, unspecified: Secondary | ICD-10-CM | POA: Insufficient documentation

## 2020-11-09 DIAGNOSIS — E46 Unspecified protein-calorie malnutrition: Secondary | ICD-10-CM | POA: Insufficient documentation

## 2020-11-09 DIAGNOSIS — E119 Type 2 diabetes mellitus without complications: Secondary | ICD-10-CM | POA: Insufficient documentation

## 2020-11-09 DIAGNOSIS — K1231 Oral mucositis (ulcerative) due to antineoplastic therapy: Secondary | ICD-10-CM | POA: Insufficient documentation

## 2020-11-09 NOTE — Progress Notes (Addendum)
Nutrition Follow-up:  Patient with stage III nasopharyngeal carcinoma.  Patient receiving concurrent chemotherapy and radiation treatment.  Noted hospital admission 11/26-12/5, last 3 radiation treatments cancelled.    Spoke with patient via phone for nutrition follow-up.  Patient reports that he continues to not be able to taste much food.  Reports today he has eaten 1/2 of chicken sandwich. Drinks 3-4 shakes per day. Family picks him up different shakes.  Does not feel comfortable driving so family brings him food.  Step-daughter brings evening meal.      Medications: reviewed  Labs: reviewed  Anthropometrics:   Weight 164 lb per 11/29 (admission weight)  165 lb on 11/22  170-175 lb per chart weight.   NUTRITION DIAGNOSIS: Inadequate oral intake continues    INTERVENTION:  Continue to encourage high calorie shake (350 calorie or more) with limited intake and likely more weight loss with hospital admission.  Patient limited as to what family brings him. 4-5 shakes per day would better meet nutritional needs.  Discussed foods for him to try (pudding, soft cooked meats chopped well with gravy, macaroni and cheese with extra cheese, etc).  RD has discussed strategies to enhance taste and flavor of foods on past visits.       MONITORING, EVALUATION, GOAL: weight trends, intake   NEXT VISIT: Jan 10 phone call  Rebbeca Sheperd B. Zenia Resides, [place], [place] Registered Dietitian [phone] (mobile)

## 2020-11-11 ENCOUNTER — Inpatient Hospital Stay: Payer: Medicare Other

## 2020-11-11 ENCOUNTER — Inpatient Hospital Stay: Payer: Medicare Other | Admitting: Hematology and Oncology

## 2020-11-11 NOTE — Progress Notes (Deleted)
[facility] ([facility])  [address] [place], [place] [address] Phone: [phone]  Fax: [phone]   Clinic Day:  11/11/2020  Referring physician: Verita Lamb, NP  Chief Complaint: Kenneth Franklin is a 74 y.o. male with stage III nasopharyngeal carcinoma who is seen for assessment after interval hospitalization.  HPI: The patient was last seen in the oncology clinic on 10/20/2020. At that time, he described mouth pain and burning on the right side. He could not taste anything. He drank 4-5 Boosts or Ensures and 5 bottles of water per day. He denied any problems with his hearing. Shortness of breath was stable. He had rare headaches. Hematocrit was 34.8, hemoglobin 11.5, MCV 91.3, platelets 215,000, WBC 5,200. Sodium was 132. Magnesium was 1.4. He received week #6 cisplatin and 2 g IV magnesium.  The patient was admitted to [facility] from 10/25/2020 - 11/01/2020 for odynophagia. He received IV fluids and viscous lidocaine in the ER. He was treated with pain medications (morphine, Toradol, and Norco) as well as viscous lidocaine and magic mouthwash.  He was seen by radiation oncology; his remaining 3 fractions of radiation were discontinued.  At discharge, he was able to eat solid foods without too much difficulty.  The patient spoke to Jennet Maduro, RD on 11/09/2020. He still could not taste most foods. His family members bring him his meals. He was encouraged to drink high calorie shakes and try a variety of soft foods.  Labs followed: 10/25/2020: Hematocrit 28.5, hemoglobin   9.6, MCV 92.5, platelets 155,000, WBC 4,700. 10/27/2020: Hematocrit 30.6, hemoglobin 10.3, MCV 92.4, platelets 190,000, WBC 4,400. Ferritin 471. Iron saturation 19%. TIBC 221. Vitamin B12 242. Folate 13.1. 11/01/2020: Hematocrit 25.2, hemoglobin   8.4, MCV 92.0, platelets 136,000, WBC 3,200 (ANC 2,300).  During the interim, ***   Past Medical History:  Diagnosis Date  . Acute ischemic  stroke ([place]) 2017  . Alcohol abuse   . Anxiety   . Cancer ([place])   . Depression   . Diabetes mellitus without complication ([place])   . Dyspnea    pcp knows and ordered rescue inhaler  . Hyperlipidemia   . Hypertension   . Skin cancer    Squamous Cell Carcinoma In Situ    Past Surgical History:  Procedure Laterality Date  . CATARACT EXTRACTION Bilateral   . COLONOSCOPY    . NASOPHARYNGOSCOPY N/A 08/12/2020   Procedure: ENDOSCOPIC NASOPHARYNGOSCOPY WITH BIOPSY;  Surgeon: Margaretha Sheffield, MD;  Location: [facility] ORS;  Service: ENT;  Laterality: N/A;  . PORTA CATH INSERTION N/A 08/24/2020   Procedure: PORTA CATH INSERTION;  Surgeon: Algernon Huxley, MD;  Location: [facility];  Service: Cardiovascular;  Laterality: N/A;    Family History  Problem Relation Age of Onset  . Diabetes Brother     Social History:  reports that he quit smoking about 15 months ago. His smoking use included cigars and cigarettes. He has a 40.00 pack-year smoking history. He has never used smokeless tobacco. He reports previous alcohol use. He reports that he does not use drugs. He smoked 1 pack/day x 40 years.  He denies exposure of radiation or toxins; he did work with asphalt. He lives alone in [place]. He is widowed. The patient is*** alone today.  Allergies:  Allergies  Allergen Reactions  . Propoxyphene     Unknown reaction    Current Medications: Current Outpatient Medications  Medication Sig Dispense Refill  . ALPRAZolam (XANAX) 0.25 MG tablet TAKE 1 TABLET  EVERY DAY AS NEEDED FOR ANXIETY (Patient taking differently: Take 0.25 mg by mouth daily as needed for anxiety. ) 30 tablet 2  . aspirin EC 81 MG tablet Take 81 mg by mouth daily. Swallow whole.    Marland Kitchen atorvastatin (LIPITOR) 80 MG tablet TAKE 1 TABLET (80 MG TOTAL) BY MOUTH NIGHTLY. (Patient taking differently: Take 80 mg by mouth every evening. ) 90 tablet 0  . busPIRone (BUSPAR) 10 MG tablet TAKE 1 TABLET (10 MG TOTAL) BY MOUTH 2 (TWO) TIMES  DAILY. (Patient taking differently: Take 10 mg by mouth at bedtime. ) 180 tablet 0  . clopidogrel (PLAVIX) 75 MG tablet TAKE 1 TABLET (75 MG TOTAL) BY MOUTH DAILY. 90 tablet 0  . DENTA 5000 PLUS 1.1 % CREA dental cream Place 1 application onto teeth at bedtime.     Marland Kitchen dexamethasone (DECADRON) 4 MG tablet Take 2 tablets (8 mg total) by mouth daily. Take daily x 3 days starting the day after cisplatin chemotherapy. Take with food. 30 tablet 1  . escitalopram (LEXAPRO) 10 MG tablet Take 1 tablet (10 mg total) by mouth daily. Stop your lexapro in a week 6 tablet 0  . escitalopram (LEXAPRO) 20 MG tablet Take 20 mg by mouth every morning.     . feeding supplement (ENSURE ENLIVE / ENSURE PLUS) LIQD Take 237 mLs by mouth 3 (three) times daily between meals. 237 mL 12  . fluticasone (FLOVENT HFA) 110 MCG/ACT inhaler Inhale 2 puffs into the lungs daily as needed (shortness of breath).    Marland Kitchen glipiZIDE (GLUCOTROL) 5 MG tablet Take 5 mg by mouth daily before breakfast.     . HYDROcodone-acetaminophen (NORCO) 10-325 MG tablet Take 0.5 tablets by mouth every 4 (four) hours as needed for severe pain. 30 tablet 0  . lidocaine (XYLOCAINE) 2 % solution Use as directed 5 mLs in the mouth or throat every 6 (six) hours as needed for mouth pain. 100 mL 0  . lidocaine-prilocaine (EMLA) cream Apply 1 application topically as needed. Apply small amount to port site at least 1 hour prior to it being accessed, cover with plastic wrap 30 g 1  . losartan (COZAAR) 100 MG tablet TAKE 1 TABLET (100 MG TOTAL) BY MOUTH DAILY. (Patient taking differently: Take 100 mg by mouth every morning. ) 90 tablet 0  . magic mouthwash w/lidocaine SOLN Take 5 mLs by mouth 4 (four) times daily as needed for mouth pain. Sig: Swish/Spit 5-10 ml four times a day as needed. Dispense 480 ml. 1RF (Patient not taking: Reported on 10/25/2020) 480 mL 1  . magnesium oxide (MAG-OX) 400 MG tablet TAKE 1 TABLET BY MOUTH EVERY DAY (Patient not taking: Reported on  10/25/2020) 30 tablet 0  . metFORMIN (GLUCOPHAGE) 500 MG tablet Take 1 tablet (500 mg total) by mouth daily with breakfast. 90 tablet 0  . mirtazapine (REMERON) 30 MG tablet Take 1 tablet (30 mg total) by mouth at bedtime. (Patient taking differently: Take 15 mg by mouth at bedtime. ) 30 tablet 0  . Multiple Vitamin (MULTIVITAMIN WITH MINERALS) TABS tablet Take 1 tablet by mouth daily. 30 tablet 0  . nystatin cream (MYCOSTATIN) Apply 1 application topically 2 (two) times daily as needed (yeast).     . ondansetron (ZOFRAN) 8 MG tablet Take 1 tablet (8 mg total) by mouth 2 (two) times daily as needed. Start on the third day after cisplatin chemotherapy. 30 tablet 1  . pantoprazole (PROTONIX) 20 MG tablet Take 20 mg by mouth daily  before lunch.     . phenol (CHLORASEPTIC) 1.4 % LIQD Use as directed 1 spray in the mouth or throat as needed for throat irritation / pain.  0  . polyvinyl alcohol (LIQUIFILM TEARS) 1.4 % ophthalmic solution Place 1 drop into both eyes as needed for dry eyes. 15 mL 0  . prochlorperazine (COMPAZINE) 10 MG tablet Take 1 tablet (10 mg total) by mouth every 6 (six) hours as needed (Nausea or vomiting). 30 tablet 1  . sitaGLIPtin (JANUVIA) 100 MG tablet Take 100 mg by mouth every morning.     . sodium chloride (OCEAN) 0.65 % SOLN nasal spray Place 1 spray into both nostrils as needed for congestion.  0  . sodium fluoride (LURIDE) 1.1 (0.5 F) MG/ML SOLN Take 1 drop by mouth at bedtime. One dental application at bedtime.    . sucralfate (CARAFATE) 1 g tablet Take 1 tablet (1 g total) by mouth 4 (four) times daily. 120 tablet 11  . sucralfate (CARAFATE) 1 g tablet Take 1 tablet (1 g total) by mouth 3 (three) times daily. Dissolve in 3-4 tbsp warm water, swish and swallow. 90 tablet 3  . VITAMIN D PO Take 1 capsule by mouth daily.     No current facility-administered medications for this visit.    Review of Systems  Constitutional: Positive for weight loss (2 lbs). Negative for  chills, diaphoresis, fever and malaise/fatigue.       Feels "alright."  HENT: Negative for congestion, ear discharge, ear pain, hearing loss, nosebleeds, sinus pain, sore throat and tinnitus.        No sense of taste. Pain and burning on right side of mouth. No problems with hearing.  Eyes: Negative for blurred vision.  Respiratory: Positive for shortness of breath (on exertion, stable). Negative for cough, hemoptysis and sputum production.   Cardiovascular: Negative.  Negative for chest pain, palpitations and leg swelling.  Gastrointestinal: Negative for abdominal pain, blood in stool, constipation, diarrhea, heartburn, melena, nausea and vomiting.       Drinks Boost/Ensure 4-5 x per day. Drinks 5 bottles of water per day.  Genitourinary: Negative.  Negative for dysuria, frequency, hematuria and urgency.       Urinates regularly throughout the day.  Musculoskeletal: Negative.  Negative for back pain, joint pain, myalgias and neck pain.  Skin: Negative for itching and rash.  Neurological: Positive for headaches (rare). Negative for dizziness, tingling, sensory change and weakness.  Endo/Heme/Allergies: Negative.  Does not bruise/bleed easily.  Psychiatric/Behavioral: Negative.  Negative for depression and memory loss. The patient is not nervous/anxious and does not have insomnia.   All other systems reviewed and are negative.  Performance status (ECOG): 1***  Vitals There were no vitals taken for this visit.   Physical Exam Vitals and nursing note reviewed.  Constitutional:      General: He is not in acute distress.    Appearance: Normal appearance. He is well-developed.     Interventions: Face mask in place.     Comments: Gentleman sitting comfortably in wheelchair in no acute distress. Able to get onto table for exam.  HENT:     Head: Normocephalic and atraumatic.     Comments: Cap. Gray hair.    Right Ear: Hearing normal.     Left Ear: Hearing normal.     Mouth/Throat:      Mouth: Mucous membranes are dry. No oral lesions.     Comments: Pinkness on hard palate (improved). Eyes:     General:  No scleral icterus.    Extraocular Movements: Extraocular movements intact.     Conjunctiva/sclera: Conjunctivae normal.     Pupils: Pupils are equal, round, and reactive to light.     Comments: Eye irritation has resolved.  Cardiovascular:     Rate and Rhythm: Normal rate and regular rhythm.     Heart sounds: Normal heart sounds. No murmur heard.  No friction rub. No gallop.   Pulmonary:     Effort: Pulmonary effort is normal.     Breath sounds: Normal breath sounds. No wheezing, rhonchi or rales.  Abdominal:     General: Bowel sounds are normal. There is no distension.     Palpations: Abdomen is soft. There is no hepatomegaly, splenomegaly or mass.     Tenderness: There is no abdominal tenderness. There is no guarding or rebound.  Musculoskeletal:        General: No tenderness. Normal range of motion.     Cervical back: Normal range of motion and neck supple.  Lymphadenopathy:     Head:     Right side of head: No preauricular, posterior auricular or occipital adenopathy.     Left side of head: No preauricular, posterior auricular or occipital adenopathy.     Cervical: No cervical adenopathy.     Upper Body:     Right upper body: No supraclavicular or axillary adenopathy.     Left upper body: No supraclavicular or axillary adenopathy.     Lower Body: No right inguinal adenopathy. No left inguinal adenopathy.  Skin:    General: Skin is warm and dry.     Findings: No bruising, erythema, lesion or rash.     Comments: Ruddy erythema on neck s/p radiation.  Neurological:     Mental Status: He is alert and oriented to person, place, and time.  Psychiatric:        Behavior: Behavior normal.        Thought Content: Thought content normal.        Judgment: Judgment normal.    No visits with results within 3 Day(s) from this visit.  Latest known visit with results  is:  Admission on 08/12/2020, Discharged on 08/12/2020  Component Date Value Ref Range Status  . Sodium 08/12/2020 132* 135 - 145 mmol/L Final  . Potassium 08/12/2020 4.2  3.5 - 5.1 mmol/L Final  . Chloride 08/12/2020 96* 98 - 111 mmol/L Final  . CO2 08/12/2020 27  22 - 32 mmol/L Final  . Glucose, Bld 08/12/2020 374* 70 - 99 mg/dL Final   Glucose reference range applies only to samples taken after fasting for at least 8 hours.  . BUN 08/12/2020 7* 8 - 23 mg/dL Final  . Creatinine, Ser 08/12/2020 0.84  0.61 - 1.24 mg/dL Final  . Calcium 08/12/2020 8.5* 8.9 - 10.3 mg/dL Final  . GFR calc non Af Amer 08/12/2020 >60  >60 mL/min Final  . GFR calc Af Amer 08/12/2020 >60  >60 mL/min Final  . Anion gap 08/12/2020 9  5 - 15 Final   Performed at [facility], [address], [place], [place] [address]  . WBC 08/12/2020 5.5  4.0 - 10.5 K/uL Final  . RBC 08/12/2020 4.46  4.22 - 5.81 MIL/uL Final  . Hemoglobin 08/12/2020 13.2  13.0 - 17.0 g/dL Final  . HCT 08/12/2020 39.3  39 - 52 % Final  . MCV 08/12/2020 88.1  80.0 - 100.0 fL Final  . MCH 08/12/2020 29.6  26.0 - 34.0 pg Final  .  MCHC 08/12/2020 33.6  30.0 - 36.0 g/dL Final  . RDW 08/12/2020 12.6  11.5 - 15.5 % Final  . Platelets 08/12/2020 178  150 - 400 K/uL Final  . nRBC 08/12/2020 0.0  0.0 - 0.2 % Final   Performed at [facility], [address] [place], [place] [address]  . Glucose-Capillary 08/12/2020 368* 70 - 99 mg/dL Final   Glucose reference range applies only to samples taken after fasting for at least 8 hours.  . Glucose-Capillary 08/12/2020 333* 70 - 99 mg/dL Final   Glucose reference range applies only to samples taken after fasting for at least 8 hours.  . SURGICAL PATHOLOGY 08/12/2020    Final-Edited                   Value:SURGICAL PATHOLOGY CASE: [id number] PATIENT: Kenneth Franklin Surgical Pathology Report  Specimen Submitted: A. Nasopharyngeal tumor  Clinical History: Malignant neoplasm  nasopharynx, sphenoid sinus.  DIAGNOSIS: A. NASOPHARYNGEAL TUMOR; BIOPSY: - KERATINIZING SQUAMOUS CELL CARCINOMA, MODERATELY DIFFERENTIATED.  GROSS DESCRIPTION: Intraoperative Consultation:     Labeled: Nasopharyngeal tumor     Received: Fresh     Specimen: Nasopharyngoscopy with biopsy     Pathologic evaluation performed: Frozen section diagnosis     Diagnosis: FSA, representative section: Squamous cell carcinoma     Communicated to: Called to Dr. Kathyrn Sheriff at 11:36 AM on 08/12/2020 Quay Burow M.D.     Tissue submitted: A touch preparation with 1 Diff-Quik stained slide is performed.  Additionally, representative sections of the specimen are frozen as FSA1 and 3 frozen section slides are performed.  A. Labeled: Nasopharyngeal tumor Received: Fresh Tissue fragment(s): Multiple                          Size: Aggregate, 1.4 x 1.4 x 0.4 cm Description: Received fresh on a Telfa pad are fragments of pink soft tissue and blood clot.  A touch preparation with 1 Diff-Quik stained slide is performed.  Representative sections are frozen as FSA1 with 3 frozen section slides performed.  The frozen section remnant is submitted in cassette 1 and the remainder of the specimen is submitted in cassette 2.  Final Diagnosis performed by Quay Burow, MD.   Electronically signed 08/13/2020 9:25:55AM The electronic signature indicates that the named Attending Pathologist has evaluated the specimen Technical component performed at [facility], [address] [place], [place] [address] Lab: [phone] Dir: Rush Farmer, MD, MMM  Professional component performed at [facility], [facility], [place], [place], [place] [address] Lab: [phone] Dir: Dellia Nims. Rubinas, MD  . Glucose-Capillary 08/12/2020 275* 70 - 99 mg/dL Final   Glucose reference range applies only to samples taken after fasting for at least 8 hours.  . Glucose-Capillary 08/12/2020 246* 70 - 99 mg/dL Final    Glucose reference range applies only to samples taken after fasting for at least 8 hours.    Assessment:  Kenneth Franklin is a 74 y.o. 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  Pathology revealed keratinizing squamous cell carcinoma, moderately differentiated.   Head MRI on 08/13/2020 revealed a 5.9 cm nasopharyngeal mass consistent with known nasopharyngeal carcinoma with involvement of the clivus and sphenoid sinus. There was moderate chronic small vessel ischemic disease and cerebral atrophy.  There was no intracranial extension.  PET scan on 08/18/2020 revealed a 5.7 x 4.2 cm destructive hypermetabolic mass (SUV 26.20) involving the sphenoid sinus, skull base and posterior nasopharynx.  This was consistent with patient's known squamous cell carcinoma. There was no locoregional lymphadenopathy or distant metastatic disease.  He received IMRT from 09/08/2020 - 10/21/2020.  He received 6 weeks of cisplatin (09/08/2020 - 09/29/2020; 10/13/2020- 10/20/2020).  He did not receive cisplatin on 10/06/2020 secondary to poor urine output.  Treatment was truncated secondary to an 8 day admission for odynophagia from radiation mucositis.  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  Symptomatically, ***  Plan: 1.   Labs today: CBC with diff, BMP, Mg.    2.   Clinical [id number] nasopharyngeal carcinoma  Tumor is unresectable.  PET scan revealed no evidence of distant disease  Treatment includes upfront radiation and chemotherapy followed by adjuvant chemotherapy   Cisplatin 40 mg/m2 weekly x 6-7 with radiation followed by cisplatin 80 mg/m2 D1 + 5FU 1000 mg/m2/day CI D1-4 q 28 days x 3 cycles  Baseline audiogram confirmed hearing loss associated with prior exposure.  Symptomatically, he has mucositis secondary to radiation.   He denies any nausea.  He is voiding  well.  He is s/p 5 weeks of cisplatin with radiation.   Radiation ends on 10/28/2020  Labs reviewed.  Begin week #6 cisplatin and radiation  Discuss symptom management.  He has antiemetics and pain medications at home to use on a prn bases.  Interventions are adequate.    3.   Mucositis  Exam reveals mucositis involving hard palate.  Pain controlled with hydrocodone with Tylenol.   Refill hydrocodone-acetaminophen (Norco) 10-325 mg 1/2 tablet po q 4 hours as needed for pain.  Patient notes no relief with Carafate.  Rx: Magic mouthwash. 4.   Hypomagnesemia  Magnesium 1.4.  Magnesium 2 gm IV. 5.   Week #6 cisplatin today. 6.   Assess calcium content in Ensure/Boost. 7.   RTC in 1 week for MD assessment, labs (CBC with diff, CMP, Mg), and week #7 cisplatin.  I discussed the assessment and treatment plan with the patient.  The patient was provided an opportunity to ask questions and all were answered.  The patient agreed with the plan and demonstrated an understanding of the instructions.  The patient was advised to call back if the symptoms worsen or if the condition fails to improve as anticipated.   Stevenson Windmiller C. Mike Gip, MD, PhD    11/11/2020, 11:37 AM  I, Lequita Asal, am acting as Education administrator for [employer]. Mike Gip, MD, PhD.  I, Keeli Roberg C. Mike Gip, MD, have reviewed the above documentation for accuracy and completeness, and I agree with the above.

## 2020-11-12 ENCOUNTER — Inpatient Hospital Stay: Payer: Medicare Other | Admitting: Nurse Practitioner

## 2020-11-12 ENCOUNTER — Encounter: Payer: Self-pay | Admitting: Nurse Practitioner

## 2020-11-12 ENCOUNTER — Other Ambulatory Visit: Payer: Self-pay

## 2020-11-12 ENCOUNTER — Inpatient Hospital Stay: Payer: Medicare Other

## 2020-11-12 ENCOUNTER — Other Ambulatory Visit: Payer: Self-pay | Admitting: Hematology and Oncology

## 2020-11-12 VITALS — BP 91/55 | HR 107 | Temp 97.0°F | Resp 18 | Wt 169.4 lb

## 2020-11-12 DIAGNOSIS — Z8673 Personal history of transient ischemic attack (TIA), and cerebral infarction without residual deficits: Secondary | ICD-10-CM | POA: Diagnosis not present

## 2020-11-12 DIAGNOSIS — F418 Other specified anxiety disorders: Secondary | ICD-10-CM | POA: Diagnosis not present

## 2020-11-12 DIAGNOSIS — E860 Dehydration: Secondary | ICD-10-CM | POA: Diagnosis not present

## 2020-11-12 DIAGNOSIS — Z7952 Long term (current) use of systemic steroids: Secondary | ICD-10-CM | POA: Diagnosis not present

## 2020-11-12 DIAGNOSIS — Z85828 Personal history of other malignant neoplasm of skin: Secondary | ICD-10-CM | POA: Diagnosis not present

## 2020-11-12 DIAGNOSIS — Z87891 Personal history of nicotine dependence: Secondary | ICD-10-CM | POA: Diagnosis not present

## 2020-11-12 DIAGNOSIS — Z9221 Personal history of antineoplastic chemotherapy: Secondary | ICD-10-CM | POA: Diagnosis not present

## 2020-11-12 DIAGNOSIS — I10 Essential (primary) hypertension: Secondary | ICD-10-CM | POA: Diagnosis not present

## 2020-11-12 DIAGNOSIS — E785 Hyperlipidemia, unspecified: Secondary | ICD-10-CM | POA: Diagnosis not present

## 2020-11-12 DIAGNOSIS — E46 Unspecified protein-calorie malnutrition: Secondary | ICD-10-CM | POA: Diagnosis not present

## 2020-11-12 DIAGNOSIS — C119 Malignant neoplasm of nasopharynx, unspecified: Secondary | ICD-10-CM

## 2020-11-12 DIAGNOSIS — E119 Type 2 diabetes mellitus without complications: Secondary | ICD-10-CM | POA: Diagnosis not present

## 2020-11-12 DIAGNOSIS — Z125 Encounter for screening for malignant neoplasm of prostate: Secondary | ICD-10-CM | POA: Diagnosis not present

## 2020-11-12 DIAGNOSIS — Z7951 Long term (current) use of inhaled steroids: Secondary | ICD-10-CM | POA: Diagnosis not present

## 2020-11-12 DIAGNOSIS — Z7984 Long term (current) use of oral hypoglycemic drugs: Secondary | ICD-10-CM | POA: Diagnosis not present

## 2020-11-12 DIAGNOSIS — K1231 Oral mucositis (ulcerative) due to antineoplastic therapy: Secondary | ICD-10-CM | POA: Diagnosis not present

## 2020-11-12 DIAGNOSIS — T451X5A Adverse effect of antineoplastic and immunosuppressive drugs, initial encounter: Secondary | ICD-10-CM | POA: Diagnosis not present

## 2020-11-12 DIAGNOSIS — Z833 Family history of diabetes mellitus: Secondary | ICD-10-CM | POA: Diagnosis not present

## 2020-11-12 DIAGNOSIS — Z79899 Other long term (current) drug therapy: Secondary | ICD-10-CM | POA: Diagnosis not present

## 2020-11-12 DIAGNOSIS — Z7982 Long term (current) use of aspirin: Secondary | ICD-10-CM | POA: Diagnosis not present

## 2020-11-12 DIAGNOSIS — Z95828 Presence of other vascular implants and grafts: Secondary | ICD-10-CM

## 2020-11-12 DIAGNOSIS — C113 Malignant neoplasm of anterior wall of nasopharynx: Secondary | ICD-10-CM | POA: Diagnosis present

## 2020-11-12 DIAGNOSIS — Z923 Personal history of irradiation: Secondary | ICD-10-CM | POA: Diagnosis not present

## 2020-11-12 MED ORDER — HEPARIN SOD (PORK) LOCK FLUSH 100 UNIT/ML IV SOLN
500.0000 [IU] | Freq: Once | INTRAVENOUS | Status: AC
  Administered 2020-11-12: 13:00:00 500 [IU] via INTRAVENOUS
  Filled 2020-11-12: qty 5

## 2020-11-12 MED ORDER — SODIUM CHLORIDE 0.9 % IV SOLN
10.0000 mg | Freq: Once | INTRAVENOUS | Status: AC
  Administered 2020-11-12: 12:00:00 10 mg via INTRAVENOUS
  Filled 2020-11-12: qty 1

## 2020-11-12 MED ORDER — DEXAMETHASONE 2 MG PO TABS: 2.0000 mg | ORAL_TABLET | Freq: Every day | ORAL | 0 refills | Status: DC

## 2020-11-12 MED ORDER — SODIUM CHLORIDE 0.9 % IV SOLN
Freq: Once | INTRAVENOUS | Status: AC
  Filled 2020-11-12: qty 250

## 2020-11-12 MED ORDER — MAGIC MOUTHWASH W/LIDOCAINE: 5.0000 mL | Freq: Four times a day (QID) | ORAL | 1 refills | Status: DC | PRN

## 2020-11-12 MED ORDER — SODIUM CHLORIDE 0.9% FLUSH
10.0000 mL | INTRAVENOUS | Status: DC | PRN
  Administered 2020-11-12: 12:00:00 10 mL via INTRAVENOUS
  Filled 2020-11-12: qty 10

## 2020-11-12 NOTE — Progress Notes (Signed)
[name]  [address], [address] [place], [place] [address] Phone: [phone]  Fax: [phone]   Clinic Day:  11/12/2020  Referring physician: Verita Lamb, NP  Chief Complaint: Kenneth Franklin is a 74 y.o. male with stage III nasopharyngeal carcinoma who is seen for hospital follow-up  HPI: Kenneth Franklin, 74 year old male diagnosed stage III nasopharyngeal carcinoma, returns to clinic today for hospital follow-up.  He was seen in ER on 10/23/2020 for mouth pain  and discharged home.  He returned to ER on 11/27 for same.  Unable to tolerate liquids or solids by mouth and he was admitted to the hospital.  Symptoms refractory to sucralfate, Magic mouthwash, and viscous lidocaine. Hospital courseHospital course included supportive care and pain management. Symptoms slowly improved and by day of discharge, 11/01/20, he was able to tolerate soft solids without complication. He was started on remeron for possible appetite stimulation as well as anxiety and depression.  Today, he says he has slowly improved with esophageal pain since discharge from hospital but continues to have poor oral intake secondary to bland taste/taste changes. He was seen by dietitian on 11/09/20 who discussed home modifications but patient is hoping taste returns. Daughter, who accompanies him today says he drinks 2-3 ensure per day. He is able to swallow but does not appear to want to eat. He is sleepy with pain medication. Daughter says he isn't as mobile as he has been. Seems weaker. Radiation was discontinued 3 treatments early due to side effects.   Past Medical History:  Diagnosis Date  . Acute ischemic stroke ([place]) 2017  . Alcohol abuse   . Anxiety   . Cancer ([place])   . Depression   . Diabetes mellitus without complication ([place])   . Dyspnea    pcp knows and ordered rescue inhaler  . Hyperlipidemia   . Hypertension   . Skin cancer    Squamous Cell Carcinoma In Situ    Past  Surgical History:  Procedure Laterality Date  . CATARACT EXTRACTION Bilateral   . COLONOSCOPY    . NASOPHARYNGOSCOPY N/A 08/12/2020   Procedure: ENDOSCOPIC NASOPHARYNGOSCOPY WITH BIOPSY;  Surgeon: Margaretha Sheffield, MD;  Location: [facility] ORS;  Service: ENT;  Laterality: N/A;  . PORTA CATH INSERTION N/A 08/24/2020   Procedure: PORTA CATH INSERTION;  Surgeon: Algernon Huxley, MD;  Location: [facility];  Service: Cardiovascular;  Laterality: N/A;    Family History  Problem Relation Age of Onset  . Diabetes Brother     Social History:  reports that he quit smoking about 15 months ago. His smoking use included cigars and cigarettes. He has a 40.00 pack-year smoking history. He has never used smokeless tobacco. He reports previous alcohol use. He reports that he does not use drugs. He smoked 1 pack/day x 40 years.  He denies exposure of radiation or toxins; he did work with asphalt. He lives alone in [place]. He is widowed. The patient is accompanied today.   Allergies:  Allergies  Allergen Reactions  . Propoxyphene     Unknown reaction    Current Medications: Current Outpatient Medications  Medication Sig Dispense Refill  . ALPRAZolam (XANAX) 0.25 MG tablet TAKE 1 TABLET EVERY DAY AS NEEDED FOR ANXIETY (Patient taking differently: Take 0.25 mg by mouth daily as needed for anxiety. ) 30 tablet 2  . aspirin EC 81 MG tablet Take 81 mg by mouth daily. Swallow whole.    Marland Kitchen atorvastatin (LIPITOR) 80 MG tablet TAKE 1  TABLET (80 MG TOTAL) BY MOUTH NIGHTLY. (Patient taking differently: Take 80 mg by mouth every evening. ) 90 tablet 0  . busPIRone (BUSPAR) 10 MG tablet TAKE 1 TABLET (10 MG TOTAL) BY MOUTH 2 (TWO) TIMES DAILY. (Patient taking differently: Take 10 mg by mouth at bedtime. ) 180 tablet 0  . clopidogrel (PLAVIX) 75 MG tablet TAKE 1 TABLET (75 MG TOTAL) BY MOUTH DAILY. 90 tablet 0  . DENTA 5000 PLUS 1.1 % CREA dental cream Place 1 application onto teeth at bedtime.     Marland Kitchen dexamethasone  (DECADRON) 4 MG tablet Take 2 tablets (8 mg total) by mouth daily. Take daily x 3 days starting the day after cisplatin chemotherapy. Take with food. 30 tablet 1  . escitalopram (LEXAPRO) 10 MG tablet Take 1 tablet (10 mg total) by mouth daily. Stop your lexapro in a week 6 tablet 0  . escitalopram (LEXAPRO) 20 MG tablet Take 20 mg by mouth every morning.     . feeding supplement (ENSURE ENLIVE / ENSURE PLUS) LIQD Take 237 mLs by mouth 3 (three) times daily between meals. 237 mL 12  . fluticasone (FLOVENT HFA) 110 MCG/ACT inhaler Inhale 2 puffs into the lungs daily as needed (shortness of breath).    Marland Kitchen glipiZIDE (GLUCOTROL) 5 MG tablet Take 5 mg by mouth daily before breakfast.     . HYDROcodone-acetaminophen (NORCO) 10-325 MG tablet Take 0.5 tablets by mouth every 4 (four) hours as needed for severe pain. 30 tablet 0  . lidocaine (XYLOCAINE) 2 % solution Use as directed 5 mLs in the mouth or throat every 6 (six) hours as needed for mouth pain. 100 mL 0  . lidocaine-prilocaine (EMLA) cream Apply 1 application topically as needed. Apply small amount to port site at least 1 hour prior to it being accessed, cover with plastic wrap 30 g 1  . losartan (COZAAR) 100 MG tablet TAKE 1 TABLET (100 MG TOTAL) BY MOUTH DAILY. (Patient taking differently: Take 100 mg by mouth every morning. ) 90 tablet 0  . magic mouthwash w/lidocaine SOLN Take 5 mLs by mouth 4 (four) times daily as needed for mouth pain. Sig: Swish/Spit 5-10 ml four times a day as needed. Dispense 480 ml. 1RF (Patient not taking: Reported on 10/25/2020) 480 mL 1  . magnesium oxide (MAG-OX) 400 MG tablet TAKE 1 TABLET BY MOUTH EVERY DAY (Patient not taking: Reported on 10/25/2020) 30 tablet 0  . metFORMIN (GLUCOPHAGE) 500 MG tablet Take 1 tablet (500 mg total) by mouth daily with breakfast. 90 tablet 0  . mirtazapine (REMERON) 30 MG tablet Take 1 tablet (30 mg total) by mouth at bedtime. (Patient taking differently: Take 15 mg by mouth at bedtime. )  30 tablet 0  . Multiple Vitamin (MULTIVITAMIN WITH MINERALS) TABS tablet Take 1 tablet by mouth daily. 30 tablet 0  . nystatin cream (MYCOSTATIN) Apply 1 application topically 2 (two) times daily as needed (yeast).     . ondansetron (ZOFRAN) 8 MG tablet Take 1 tablet (8 mg total) by mouth 2 (two) times daily as needed. Start on the third day after cisplatin chemotherapy. 30 tablet 1  . pantoprazole (PROTONIX) 20 MG tablet Take 20 mg by mouth daily before lunch.     . phenol (CHLORASEPTIC) 1.4 % LIQD Use as directed 1 spray in the mouth or throat as needed for throat irritation / pain.  0  . polyvinyl alcohol (LIQUIFILM TEARS) 1.4 % ophthalmic solution Place 1 drop into both eyes as needed  for dry eyes. 15 mL 0  . prochlorperazine (COMPAZINE) 10 MG tablet Take 1 tablet (10 mg total) by mouth every 6 (six) hours as needed (Nausea or vomiting). 30 tablet 1  . sitaGLIPtin (JANUVIA) 100 MG tablet Take 100 mg by mouth every morning.     . sodium chloride (OCEAN) 0.65 % SOLN nasal spray Place 1 spray into both nostrils as needed for congestion.  0  . sodium fluoride (LURIDE) 1.1 (0.5 F) MG/ML SOLN Take 1 drop by mouth at bedtime. One dental application at bedtime.    . sucralfate (CARAFATE) 1 g tablet Take 1 tablet (1 g total) by mouth 4 (four) times daily. 120 tablet 11  . sucralfate (CARAFATE) 1 g tablet Take 1 tablet (1 g total) by mouth 3 (three) times daily. Dissolve in 3-4 tbsp warm water, swish and swallow. 90 tablet 3  . VITAMIN D PO Take 1 capsule by mouth daily.     No current facility-administered medications for this visit.    ROS Performance status (ECOG): 1  Vitals There were no vitals taken for this visit.   Physical Exam Vitals and nursing note reviewed.  Constitutional:      General: He is not in acute distress.    Appearance: He is ill-appearing (chronically ill appearing).     Comments: In wheelchair. Daughter accompanies.   HENT:     Head: Normocephalic.     Mouth/Throat:      Mouth: Mucous membranes are dry.     Pharynx: No posterior oropharyngeal erythema.     Comments: Evidence of healing oral ulcerations. Partial removed for exam. No evidence of secondary infections. No erythema. Dry mouth.  Eyes:     General: No scleral icterus.    Conjunctiva/sclera: Conjunctivae normal.  Cardiovascular:     Rate and Rhythm: Normal rate and regular rhythm.  Pulmonary:     Effort: No respiratory distress.     Breath sounds: No wheezing.  Abdominal:     Tenderness: There is no abdominal tenderness. There is no guarding.  Musculoskeletal:        General: No deformity.     Cervical back: Normal range of motion.  Skin:    General: Skin is warm and dry.     Coloration: Skin is not pale.  Neurological:     Mental Status: He is alert and oriented to person, place, and time.  Psychiatric:        Mood and Affect: Mood normal.        Behavior: Behavior normal.     CBC Latest Ref Rng & Units 11/01/2020 10/31/2020 10/30/2020  WBC 4.0 - 10.5 K/uL 3.2(L) 3.3(L) 3.7(L)  Hemoglobin 13.0 - 17.0 g/dL 8.4(L) 9.2(L) 8.6(L)  Hematocrit 39.0 - 52.0 % 25.2(L) 26.6(L) 25.9(L)  Platelets 150 - 400 K/uL 136(L) 165 138(L)   CMP Latest Ref Rng & Units 11/01/2020 10/31/2020 10/30/2020  Glucose 70 - 99 mg/dL 105(H) 108(H) 108(H)  BUN 8 - 23 mg/dL 6(L) 6(L) 6(L)  Creatinine 0.61 - 1.24 mg/dL 0.62 0.73 0.71  Sodium 135 - 145 mmol/L 136 135 133(L)  Potassium 3.5 - 5.1 mmol/L 4.1 4.7 3.7  Chloride 98 - 111 mmol/L 103 102 99  CO2 22 - 32 mmol/L 25 26 24   Calcium 8.9 - 10.3 mg/dL 7.8(L) 7.9(L) 7.0(L)  Total Protein 6.5 - 8.1 g/dL 5.9(L) 6.4(L) 5.5(L)  Total Bilirubin 0.3 - 1.2 mg/dL 0.8 0.8 1.0  Alkaline Phos 38 - 126 U/L 60 68 56  AST 15 -  41 U/L 20 20 20   ALT 0 - 44 U/L 12 13 13     Assessment:  Kenneth Franklin is a 74 y.o. 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  Pathology revealed keratinizing squamous cell carcinoma, moderately differentiated.   Head  MRI on 08/13/2020 revealed a 5.9 cm nasopharyngeal mass consistent with known nasopharyngeal carcinoma with involvement of the clivus and sphenoid sinus. There was moderate chronic small vessel ischemic disease and cerebral atrophy.  There was no intracranial extension.  PET scan on 08/18/2020 revealed a 5.7 x 4.2 cm destructive hypermetabolic mass (SUV 62.37) involving the sphenoid sinus, skull base and posterior nasopharynx.  This was consistent with patient's known squamous cell carcinoma. There was no locoregional lymphadenopathy or distant metastatic disease.  He began IMRT on 09/08/2020.  He is s/p 4 weeks of cisplatin (09/08/2020 - 09/29/2020; 10/13/2020).  He did not receive treatment on 10/06/2020 secondary to poor urine output. Treatment discontinued due to hospitalization for mucositis and stomatitis.   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  S/p hospitalization for mucositis, stomatitis, electrolyte abnormalities, and dehydration secondary to chemotherapy and radiation. Improving gradually though not at baseline. Suspect ongoing dehydration and poor intake due to taste changes. BP soft. Weight down. Dry appearing.   Plan: 1.   Labs weren't drawn today.  2.   Clinical [id number] nasopharyngeal carcinoma  Unresectable. No evidence of distant metastatic disease on PET.   S/p upfront radiation and chemotherapy: Cisplatin 40 mg/m2 weekly x 6-7 with radiation.   Tolerated treatment moderately to poorly secondary to mucositis (see below)  Plan for repeat audiogram prior to cisplatin 80 mg/m2 D1 + 5FU 1000 mg/m2/day CI D1-4 q 28 days x 3 cycles   3.   Mucositis  Secondary to chemotherapy and radiation  Grade 1+ (improved from grade 3 w/ hospitalization)   D/c magic mouthwash d/t increased pain  Continue viscous lidocaine. Refilled today  Continue pain medication as  prescribed  Consider Healios, an glutamine and arginine supplement. Provided with samples of Ensure Juven 4. Dehydration  BP soft today.   IV fluids x 1L in clinic today  5. Appetite/Malnutrition  Suspect weakness and malaise secondary to malnutrition and dehydration. Continue remeron as prescribed. Discussed management & interventions for taste changes. Encouraged high protein, high calorie foods.   Decadron 10 mg in clinic. Start decadron 2 mg daily tomorrow x 5 days.  6. Dry Mouth  Secondary to radiation  Discussed management  Disposition:  RTC in 1 week for labs (CBC, CMP, Mg), fluids, virtual visit with Symptom Management to follow up on appetite, dehydration, and mucositis then in 2 weeks labs, evaluation with Dr. Mike Gip, possible fluids.   Patient advised to notify the clinic if there is no improvement in symptoms or if symptoms worsen in next 3-4 days.   I discussed the assessment and treatment plan with the patient.  The patient was provided an opportunity to ask questions and all were answered.  The patient agreed with the plan and demonstrated an understanding of the instructions.  The patient was advised to call back if the symptoms worsen or if the condition fails to improve as anticipated.  Beckey Rutter, DNP, AGNP-C [place] at [facility] [phone] (clinic)  CC: Rulon Abide, NP & Dr. Mike Gip

## 2020-11-12 NOTE — Progress Notes (Signed)
The patient reports he has been having SOB with walking. He is currently in a wheel chair today /  b/p low 98/55 the patient does not report signs or symptoms of  lightheaded or dizziness noted.

## 2020-11-12 NOTE — Progress Notes (Signed)
Tolerated IVF's + dexamethasone today. No complaints voiced at time of discharge. Accompanied by daughter via wheelchair.

## 2020-11-15 ENCOUNTER — Other Ambulatory Visit: Payer: Self-pay | Admitting: Hematology and Oncology

## 2020-11-16 ENCOUNTER — Telehealth: Payer: Self-pay

## 2020-11-16 NOTE — Telephone Encounter (Signed)
Spoke with the patient daughter Otila Kluver) to see if there was anything that we could help with due to constipation. Otila Kluver states she was able get some stool softner and that he was doing a lot better.

## 2020-11-17 ENCOUNTER — Other Ambulatory Visit: Payer: Self-pay | Admitting: Hematology and Oncology

## 2020-11-18 ENCOUNTER — Inpatient Hospital Stay: Payer: Medicare Other

## 2020-11-18 ENCOUNTER — Other Ambulatory Visit: Payer: Medicare Other

## 2020-11-18 ENCOUNTER — Ambulatory Visit: Payer: Medicare Other | Admitting: Hematology and Oncology

## 2020-11-18 ENCOUNTER — Other Ambulatory Visit: Payer: Self-pay

## 2020-11-18 DIAGNOSIS — Z95828 Presence of other vascular implants and grafts: Secondary | ICD-10-CM

## 2020-11-18 DIAGNOSIS — C113 Malignant neoplasm of anterior wall of nasopharynx: Secondary | ICD-10-CM | POA: Diagnosis not present

## 2020-11-18 DIAGNOSIS — E860 Dehydration: Secondary | ICD-10-CM

## 2020-11-18 DIAGNOSIS — C119 Malignant neoplasm of nasopharynx, unspecified: Secondary | ICD-10-CM

## 2020-11-18 LAB — CBC WITH DIFFERENTIAL/PLATELET
Abs Immature Granulocytes: 0.07 10*3/uL (ref 0.00–0.07)
Basophils Absolute: 0 10*3/uL (ref 0.0–0.1)
Basophils Relative: 0 %
Eosinophils Absolute: 0 10*3/uL (ref 0.0–0.5)
Eosinophils Relative: 1 %
HCT: 33.8 % — ABNORMAL LOW (ref 39.0–52.0)
Hemoglobin: 11.5 g/dL — ABNORMAL LOW (ref 13.0–17.0)
Immature Granulocytes: 1 %
Lymphocytes Relative: 17 %
Lymphs Abs: 1.1 10*3/uL (ref 0.7–4.0)
MCH: 32.1 pg (ref 26.0–34.0)
MCHC: 34 g/dL (ref 30.0–36.0)
MCV: 94.4 fL (ref 80.0–100.0)
Monocytes Absolute: 0.5 10*3/uL (ref 0.1–1.0)
Monocytes Relative: 7 %
Neutro Abs: 4.9 10*3/uL (ref 1.7–7.7)
Neutrophils Relative %: 74 %
Platelets: 324 10*3/uL (ref 150–400)
RBC: 3.58 MIL/uL — ABNORMAL LOW (ref 4.22–5.81)
RDW: 18.3 % — ABNORMAL HIGH (ref 11.5–15.5)
WBC: 6.6 10*3/uL (ref 4.0–10.5)
nRBC: 0 % (ref 0.0–0.2)

## 2020-11-18 LAB — COMPREHENSIVE METABOLIC PANEL
ALT: 42 U/L (ref 0–44)
AST: 35 U/L (ref 15–41)
Albumin: 4.2 g/dL (ref 3.5–5.0)
Alkaline Phosphatase: 77 U/L (ref 38–126)
Anion gap: 12 (ref 5–15)
BUN: 32 mg/dL — ABNORMAL HIGH (ref 8–23)
CO2: 27 mmol/L (ref 22–32)
Calcium: 10.1 mg/dL (ref 8.9–10.3)
Chloride: 94 mmol/L — ABNORMAL LOW (ref 98–111)
Creatinine, Ser: 1 mg/dL (ref 0.61–1.24)
GFR, Estimated: >60 mL/min (ref >60)
Glucose, Bld: 189 mg/dL — ABNORMAL HIGH (ref 70–99)
Potassium: 4.3 mmol/L (ref 3.5–5.1)
Sodium: 133 mmol/L — ABNORMAL LOW (ref 135–145)
Total Bilirubin: 0.9 mg/dL (ref 0.3–1.2)
Total Protein: 7.8 g/dL (ref 6.5–8.1)

## 2020-11-18 LAB — MAGNESIUM: Magnesium: 1.8 mg/dL (ref 1.7–2.4)

## 2020-11-18 MED ORDER — SODIUM CHLORIDE 0.9 % IV SOLN
Freq: Once | INTRAVENOUS | Status: AC
  Filled 2020-11-18: qty 250

## 2020-11-18 MED ORDER — SODIUM CHLORIDE 0.9% FLUSH
10.0000 mL | INTRAVENOUS | Status: DC | PRN
  Administered 2020-11-18: 14:00:00 10 mL via INTRAVENOUS
  Filled 2020-11-18: qty 10

## 2020-11-18 MED ORDER — HEPARIN SOD (PORK) LOCK FLUSH 100 UNIT/ML IV SOLN
500.0000 [IU] | Freq: Once | INTRAVENOUS | Status: AC
  Administered 2020-11-18: 16:00:00 500 [IU] via INTRAVENOUS
  Filled 2020-11-18: qty 5

## 2020-11-18 MED ORDER — SODIUM CHLORIDE 0.9 % IV SOLN
10.0000 mg | Freq: Once | INTRAVENOUS | Status: AC
  Administered 2020-11-18: 15:00:00 10 mg via INTRAVENOUS
  Filled 2020-11-18: qty 1

## 2020-11-18 NOTE — Progress Notes (Signed)
Patient received iv fluids and dexamethasone in clinic today. Tolerated well. Patient stable at discharge.

## 2020-11-19 ENCOUNTER — Inpatient Hospital Stay: Payer: Medicare Other

## 2020-11-19 VITALS — BP 124/72 | HR 90 | Temp 96.9°F | Resp 18

## 2020-11-19 DIAGNOSIS — C113 Malignant neoplasm of anterior wall of nasopharynx: Secondary | ICD-10-CM | POA: Diagnosis not present

## 2020-11-19 DIAGNOSIS — E860 Dehydration: Secondary | ICD-10-CM

## 2020-11-19 LAB — PSA: Prostatic Specific Antigen: 1.75 ng/mL (ref 0.00–4.00)

## 2020-11-19 MED ORDER — HEPARIN SOD (PORK) LOCK FLUSH 100 UNIT/ML IV SOLN
500.0000 [IU] | Freq: Once | INTRAVENOUS | Status: AC
  Administered 2020-11-19: 14:00:00 500 [IU] via INTRAVENOUS
  Filled 2020-11-19: qty 5

## 2020-11-19 MED ORDER — SODIUM CHLORIDE 0.9 % IV SOLN
Freq: Once | INTRAVENOUS | Status: AC
  Filled 2020-11-19: qty 250

## 2020-11-19 MED ORDER — SODIUM CHLORIDE 0.9% FLUSH
10.0000 mL | INTRAVENOUS | Status: DC | PRN
  Administered 2020-11-19: 13:00:00 10 mL via INTRAVENOUS
  Filled 2020-11-19: qty 10

## 2020-11-19 NOTE — Progress Notes (Signed)
Patient received prescribed treatment in clinic. Tolerated well. Patient stable at discharge. 

## 2020-11-25 NOTE — Progress Notes (Signed)
[facility]  [address] [place], [place] [address] Phone: [phone]  Fax: [phone]   Clinic Day:  11/26/2020  Referring physician: Verita Lamb, NP  Chief Complaint: Kenneth Franklin is a 74 y.o. male with stage III nasopharyngeal carcinoma who is seen for 2 week assessment.  HPI: The patient was last seen in the oncology clinic on 11/12/2020 by Kenneth Casa, NP. At that time, his esophageal pain had slowly improved. He continued to have poor oral intake secondary to taste changes. His daughter noted that he seemed weaker and less mobile than usual. He received IV fluids.  Labs on 11/18/2020 revealed a hematocrit of 33.8, hemoglobin 11.5, MCV 94.4, platelets 324,000, WBC 6,600. Sodium was 133.  Creatinine was 1.0.  Magnesium was 1.8.  He received IV fluids on 11/18/2020 and 11/19/2020.  During the interim, he has been "ok". His main problem is constipation; he has not had a bowel movement in 2-3 days. He feels that he needs to have a bowel movement but is not able to pass any stool. He has tried suppositories, MiraLax, Ex-lax, and stool softeners but has not been taking them everyday. He has not tried magnesium citrate.   He feels th constipation started after he began drinking Ensure 4 times per day. He stopped drinking it but is still constipated. He is not taking any pain pills. He gets nauseated sometimes but denies vomiting.  He does not have an appetite because he cannot taste anything. Foods don't taste bad, they just don't have any taste. The pain in his mouth has resolved. He stays hydrated and urinates several times per day.   Past Medical History:  Diagnosis Date  . Acute ischemic stroke ([place]) 2017  . Alcohol abuse   . Anxiety   . Cancer ([place])   . Depression   . Diabetes mellitus without complication ([place])   . Dyspnea    pcp knows and ordered rescue inhaler  . Hyperlipidemia   . Hypertension   . Skin cancer    Squamous  Cell Carcinoma In Situ    Past Surgical History:  Procedure Laterality Date  . CATARACT EXTRACTION Bilateral   . COLONOSCOPY    . NASOPHARYNGOSCOPY N/A 08/12/2020   Procedure: ENDOSCOPIC NASOPHARYNGOSCOPY WITH BIOPSY;  Surgeon: Margaretha Sheffield, MD;  Location: [facility] OR[facility];  Service: ENT;  Laterality: N/A;  . PORTA CATH INSERTION N/A 08/24/2020   Procedure: PORTA CATH INSERTION;  Surgeon: Algernon Huxley, MD;  Location: [facility];  Service: Cardiovascular;  Laterality: N/A;    Family History  Problem Relation Age of Onset  . Diabetes Brother     Social History:  reports that he quit smoking about 15 months ago. His smoking use included cigars and cigarettes. He has a 40.00 pack-year smoking history. He has never used smokeless tobacco. He reports previous alcohol use. He reports that he does not use drugs. He smoked 1 pack/day x 40 years.  He denies exposure of radiation or toxins; he did work with asphalt. He lives alone in [place]. He is widowed. The patient is accompanied by his step daughter, Kenneth Franklin, today.  Allergies:  Allergies  Allergen Reactions  . Propoxyphene     Unknown reaction    Current Medications: Current Outpatient Medications  Medication Sig Dispense Refill  . ALPRAZolam (XANAX) 0.25 MG tablet TAKE 1 TABLET EVERY DAY AS NEEDED FOR ANXIETY (Patient taking differently: Take 0.25 mg by mouth daily as needed for anxiety.) 30 tablet  2  . aspirin EC 81 MG tablet Take 81 mg by mouth daily. Swallow whole.    Marland Kitchen atorvastatin (LIPITOR) 80 MG tablet TAKE 1 TABLET (80 MG TOTAL) BY MOUTH NIGHTLY. (Patient taking differently: Take 80 mg by mouth every evening.) 90 tablet 0  . busPIRone (BUSPAR) 10 MG tablet TAKE 1 TABLET (10 MG TOTAL) BY MOUTH 2 (TWO) TIMES DAILY. (Patient taking differently: Take 10 mg by mouth at bedtime.) 180 tablet 0  . clopidogrel (PLAVIX) 75 MG tablet TAKE 1 TABLET (75 MG TOTAL) BY MOUTH DAILY. 90 tablet 0  . DENTA 5000 PLUS 1.1 % CREA dental cream  Place 1 application onto teeth at bedtime.     Marland Kitchen dexamethasone (DECADRON) 2 MG tablet Take 1 tablet (2 mg total) by mouth daily. 5 tablet 0  . escitalopram (LEXAPRO) 10 MG tablet Take 1 tablet (10 mg total) by mouth daily. Stop your lexapro in a week 6 tablet 0  . escitalopram (LEXAPRO) 20 MG tablet Take 20 mg by mouth every morning.     . feeding supplement (ENSURE ENLIVE / ENSURE PLUS) LIQD Take 237 mLs by mouth 3 (three) times daily between meals. 237 mL 12  . fluticasone (FLOVENT HFA) 110 MCG/ACT inhaler Inhale 2 puffs into the lungs daily as needed (shortness of breath).    Marland Kitchen glipiZIDE (GLUCOTROL) 5 MG tablet Take 5 mg by mouth daily before breakfast.     . HYDROcodone-acetaminophen (NORCO) 10-325 MG tablet Take 0.5 tablets by mouth every 4 (four) hours as needed for severe pain. 30 tablet 0  . lidocaine (XYLOCAINE) 2 % solution USE AS DIRECTED 5 MLS IN THE MOUTH OR THROAT EVERY 6 (SIX) HOURS AS NEEDED FOR MOUTH PAIN. 100 mL 0  . lidocaine-prilocaine (EMLA) cream Apply 1 application topically as needed. Apply small amount to port site at least 1 hour prior to it being accessed, cover with plastic wrap 30 g 1  . losartan (COZAAR) 100 MG tablet TAKE 1 TABLET (100 MG TOTAL) BY MOUTH DAILY. (Patient taking differently: Take 100 mg by mouth every morning.) 90 tablet 0  . magic mouthwash w/lidocaine SOLN Take 5 mLs by mouth 4 (four) times daily as needed for mouth pain. Sig: Swish/Spit 5-10 ml four times a day as needed. Dispense 480 ml. 1RF 480 mL 1  . magnesium oxide (MAG-OX) 400 MG tablet TAKE 1 TABLET BY MOUTH EVERY DAY 90 tablet 1  . metFORMIN (GLUCOPHAGE) 500 MG tablet Take 1 tablet (500 mg total) by mouth daily with breakfast. 90 tablet 0  . Multiple Vitamin (MULTIVITAMIN WITH MINERALS) TABS tablet Take 1 tablet by mouth daily. 30 tablet 0  . nystatin cream (MYCOSTATIN) Apply 1 application topically 2 (two) times daily as needed (yeast).     . ondansetron (ZOFRAN) 8 MG tablet Take 1 tablet (8  mg total) by mouth 2 (two) times daily as needed. Start on the third day after cisplatin chemotherapy. 30 tablet 1  . pantoprazole (PROTONIX) 20 MG tablet Take 20 mg by mouth daily before lunch.     . phenol (CHLORASEPTIC) 1.4 % LIQD Use as directed 1 spray in the mouth or throat as needed for throat irritation / pain.  0  . polyvinyl alcohol (LIQUIFILM TEARS) 1.4 % ophthalmic solution Place 1 drop into both eyes as needed for dry eyes. 15 mL 0  . prochlorperazine (COMPAZINE) 10 MG tablet Take 1 tablet (10 mg total) by mouth every 6 (six) hours as needed (Nausea or vomiting). 30 tablet 1  .  sitaGLIPtin (JANUVIA) 100 MG tablet Take 100 mg by mouth every morning.     . sodium chloride (OCEAN) 0.65 % SOLN nasal spray Place 1 spray into both nostrils as needed for congestion.  0  . sodium fluoride (LURIDE) 1.1 (0.5 F) MG/ML SOLN Take 1 drop by mouth at bedtime. One dental application at bedtime.    Marland Kitchen VITAMIN D PO Take 1 capsule by mouth daily.    Marland Kitchen dexamethasone (DECADRON) 4 MG tablet Take 2 tablets (8 mg total) by mouth daily. Take daily x 3 days starting the day after cisplatin chemotherapy. Take with food. (Patient not taking: No sig reported) 30 tablet 1  . mirtazapine (REMERON) 30 MG tablet Take 1 tablet (30 mg total) by mouth at bedtime. (Patient not taking: No sig reported) 30 tablet 0  . sucralfate (CARAFATE) 1 g tablet Take 1 tablet (1 g total) by mouth 4 (four) times daily. 120 tablet 11  . sucralfate (CARAFATE) 1 g tablet Take 1 tablet (1 g total) by mouth 3 (three) times daily. Dissolve in 3-4 tbsp warm water, swish and swallow. (Patient not taking: No sig reported) 90 tablet 3   No current facility-administered medications for this visit.    Review of Systems  Constitutional: Positive for weight loss (18 lbs). Negative for chills, diaphoresis, fever and malaise/fatigue.  HENT: Negative for congestion, ear discharge, ear pain, hearing loss, nosebleeds, sinus pain, sore throat and tinnitus.         No sense of taste.  Eyes: Negative for blurred vision.  Respiratory: Positive for shortness of breath (on exertion, stable). Negative for cough, hemoptysis and sputum production.   Cardiovascular: Negative.  Negative for chest pain, palpitations and leg swelling.  Gastrointestinal: Positive for constipation and nausea. Negative for abdominal pain, blood in stool, diarrhea, heartburn, melena and vomiting.       He stays hydrated.  Genitourinary: Negative.  Negative for dysuria, frequency, hematuria and urgency.       Urinates regularly throughout the day.  Musculoskeletal: Negative.  Negative for back pain, joint pain, myalgias and neck pain.  Skin: Negative for itching and rash.  Neurological: Positive for headaches (rare). Negative for dizziness, tingling, sensory change and weakness.  Endo/Heme/Allergies: Negative.  Does not bruise/bleed easily.  Psychiatric/Behavioral: Negative.  Negative for depression and memory loss. The patient is not nervous/anxious and does not have insomnia.   All other systems reviewed and are negative.  Performance status (ECOG): 2  Vitals Blood pressure 99/68, pulse (!) 108, temperature 97.8 F (36.6 C), resp. rate 16, weight 151 lb 0.2 oz (68.5 kg), SpO2 100 %.   Physical Exam Vitals and nursing note reviewed.  Constitutional:      General: He is not in acute distress.    Appearance: Normal appearance. He is well-developed.     Interventions: Face mask in place.     Comments: Gentleman leaning over in wheelchair with his head down in no acute distress. Able to get onto table for exam.  HENT:     Head: Normocephalic and atraumatic.     Comments: Cap. Gray hair.    Right Ear: Hearing normal.     Left Ear: Hearing normal.     Mouth/Throat:     Mouth: Mucous membranes are moist. No oral lesions.     Pharynx: Oropharynx is clear.     Comments: Resolution of mucositis. Eyes:     General: No scleral icterus.    Extraocular Movements: Extraocular  movements intact.  Conjunctiva/sclera: Conjunctivae normal.     Pupils: Pupils are equal, round, and reactive to light.  Cardiovascular:     Rate and Rhythm: Normal rate and regular rhythm.     Heart sounds: Normal heart sounds. No murmur heard. No friction rub. No gallop.   Pulmonary:     Effort: Pulmonary effort is normal.     Breath sounds: Normal breath sounds. No wheezing, rhonchi or rales.  Chest:  Breasts:     Right: No axillary adenopathy or supraclavicular adenopathy.     Left: No axillary adenopathy or supraclavicular adenopathy.    Abdominal:     General: Bowel sounds are normal. There is no distension.     Palpations: Abdomen is soft. There is no hepatomegaly, splenomegaly or mass.     Tenderness: There is no abdominal tenderness. There is no guarding or rebound.  Musculoskeletal:        General: No tenderness. Normal range of motion.     Cervical back: Normal range of motion and neck supple.  Lymphadenopathy:     Head:     Right side of head: No preauricular, posterior auricular or occipital adenopathy.     Left side of head: No preauricular, posterior auricular or occipital adenopathy.     Cervical: No cervical adenopathy.     Upper Body:     Right upper body: No supraclavicular or axillary adenopathy.     Left upper body: No supraclavicular or axillary adenopathy.     Lower Body: No right inguinal adenopathy. No left inguinal adenopathy.  Skin:    General: Skin is warm and dry.     Findings: No bruising, erythema, lesion or rash.  Neurological:     Mental Status: He is alert and oriented to person, place, and time.  Psychiatric:        Behavior: Behavior normal.        Thought Content: Thought content normal.        Judgment: Judgment normal.    Infusion on 11/26/2020  Component Date Value Ref Range Status  . WBC 11/26/2020 8.9  4.0 - 10.5 K/uL Final  . RBC 11/26/2020 3.65* 4.22 - 5.81 MIL/uL Final  . Hemoglobin 11/26/2020 11.9* 13.0 - 17.0 g/dL Final   . HCT 14/97/0263 35.2* 39.0 - 52.0 % Final  . MCV 11/26/2020 96.4  80.0 - 100.0 fL Final  . MCH 11/26/2020 32.6  26.0 - 34.0 pg Final  . MCHC 11/26/2020 33.8  30.0 - 36.0 g/dL Final  . RDW 78/58/8502 17.9* 11.5 - 15.5 % Final  . Platelets 11/26/2020 266  150 - 400 K/uL Final  . nRBC 11/26/2020 0.0  0.0 - 0.2 % Final  . Neutrophils Relative % 11/26/2020 76  % Final  . Neutro Abs 11/26/2020 6.7  1.7 - 7.7 K/uL Final  . Lymphocytes Relative 11/26/2020 12  % Final  . Lymphs Abs 11/26/2020 1.1  0.7 - 4.0 K/uL Final  . Monocytes Relative 11/26/2020 10  % Final  . Monocytes Absolute 11/26/2020 0.9  0.1 - 1.0 K/uL Final  . Eosinophils Relative 11/26/2020 1  % Final  . Eosinophils Absolute 11/26/2020 0.1  0.0 - 0.5 K/uL Final  . Basophils Relative 11/26/2020 0  % Final  . Basophils Absolute 11/26/2020 0.0  0.0 - 0.1 K/uL Final  . Immature Granulocytes 11/26/2020 1  % Final  . Abs Immature Granulocytes 11/26/2020 0.12* 0.00 - 0.07 K/uL Final   Performed at [facility], [address], [place], [place]  [address]  . Sodium 11/26/2020 131* 135 - 145 mmol/L Final  . Potassium 11/26/2020 4.5  3.5 - 5.1 mmol/L Final  . Chloride 11/26/2020 94* 98 - 111 mmol/L Final  . CO2 11/26/2020 25  22 - 32 mmol/L Final  . Glucose, Bld 11/26/2020 251* 70 - 99 mg/dL Final   Glucose reference range applies only to samples taken after fasting for at least 8 hours.  . BUN 11/26/2020 28* 8 - 23 mg/dL Final  . Creatinine, Ser 11/26/2020 0.87  0.61 - 1.24 mg/dL Final  . Calcium 07/86/7544 9.6  8.9 - 10.3 mg/dL Final  . Total Protein 11/26/2020 7.8  6.5 - 8.1 g/dL Final  . Albumin 92/11/69 4.1  3.5 - 5.0 g/dL Final  . AST 21/97/5883 25  15 - 41 U/L Final  . ALT 11/26/2020 24  0 - 44 U/L Final  . Alkaline Phosphatase 11/26/2020 77  38 - 126 U/L Final  . Total Bilirubin 11/26/2020 0.8  0.3 - 1.2 mg/dL Final  . GFR, Estimated 11/26/2020 >60  >60 mL/min Final   Comment: (NOTE) Calculated using the  CKD-EPI Creatinine Equation (2021)   . Anion gap 11/26/2020 12  5 - 15 Final   Performed at [facility], [address] [place], [place] [address]   Assessment:  Kenneth Franklin is a 74 y.o. 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  Pathology revealed keratinizing squamous cell carcinoma, moderately differentiated.   Head MRI on 08/13/2020 revealed a 5.9 cm nasopharyngeal mass consistent with known nasopharyngeal carcinoma with involvement of the clivus and sphenoid sinus. There was moderate chronic small vessel ischemic disease and cerebral atrophy.  There was no intracranial extension.  PET scan on 08/18/2020 revealed a 5.7 x 4.2 cm destructive hypermetabolic mass (SUV 11.38) involving the sphenoid sinus, skull base and posterior nasopharynx.  This was consistent with patient's known squamous cell carcinoma. There was no locoregional lymphadenopathy or distant metastatic disease.  He received IMRT from 09/08/2020 - 10/21/2020.  He received 5 weeks of cisplatin (09/08/2020 - 09/29/2020; 10/13/2020 - 10/20/2020).  He did not receive treatment on 10/06/2020 secondary to poor urine output.  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  He was admitted to [facility] from 10/25/2020 - 11/01/2020 with mucositis.  He was treated with Magic mouthwash, viscous lidocaine, IV morphine and Toradol.  Symptomatically, he feels ok".  He has constipation.  He has no appetite because he cannot taste anything.  Oral pain has resolved.  Plan: 1.   Labs today: CBC with diff, CMP. 2.   Clinical [id number] nasopharyngeal carcinoma  Tumor was unresectable.  PET scan revealed no evidence of distant disease  Treatment plan: upfront radiation and chemotherapy followed by adjuvant chemotherapy   Cisplatin 40 mg/m2 weekly x 6-7 with radiation followed by cisplatin 80 mg/m2  D1 + 5FU 1000 mg/m2/day CI D1-4 q 28 days x 3 cycles  Baseline audiogram confirmed hearing loss associated with prior exposure.  He received 5 weeks of cisplatin with radiation (completed 10/21/2020).   Decision made by radiation oncology to truncate radiation by 3 fractions secondary to mucositis.  Symptomatically, he is slowly recovering from concurrent cisplatin and radiation.   He is fatigued.  Appetite is poor as food has no taste.   Discussed ongoing supportive care prior to proceeding with planned chemotherapy.  Multiple questions asked and answered. 3.  Mucositis  Exam reveals resolution of mucositis involving the hard palate.  He no longer requires oral pain medications. 4.   Dehydration  IVF today.  Check orthostatics pre and post IV fluids today.  Patient interested in continuation of fluids throughout the week. 5.   Constipation  Discuss management of constipation.  He is using suppositories, MiraLax, Ex-lax, and stool softeners  Discuss Miralax daily.  If diarrhea- decrease frequency.  Discuss food choices, "p" fruits, hydration, activity, and magnesium (MOM or magnesium citrate). 6.   Weight loss  Patient has lost 18 pounds since 11/12/2020.  Patient notes foods have no taste.  Discuss added spices for flavor and food choices.  Follow-up with nutrition on 12/07/2020. 7.   IVF today. Check orthostatics pre and post fluids. 8.   Referral to [employer], NP. 9.   RTC on Monday-Thursday next week for IVF. 10.   RTC in 1 week for MD assessment, labs (BMP), and +/- IVF.  I discussed the assessment and treatment plan with the patient.  The patient was provided an opportunity to ask questions and all were answered.  The patient agreed with the plan and demonstrated an understanding of the instructions.  The patient was advised to call back if the symptoms worsen or if the condition fails to improve as anticipated.  I provided 25 minutes of face-to-face time during this this  encounter and > 50% was spent counseling as documented under my assessment and plan.  An additional 5 minutes were spent reviewing his chart (Epic and Care Everywhere) including notes, labs, and imaging studies.    Dawnielle Christiana C. Mike Gip, MD, PhD    11/26/2020, 10:25 AM  I, Mirian Mo Tufford, am acting as Education administrator for [employer]. Mike Gip, MD, PhD.  I, Frederico Gerling C. Mike Gip, MD, have reviewed the above documentation for accuracy and completeness, and I agree with the above.

## 2020-11-26 ENCOUNTER — Other Ambulatory Visit: Payer: Self-pay

## 2020-11-26 ENCOUNTER — Inpatient Hospital Stay (HOSPITAL_BASED_OUTPATIENT_CLINIC_OR_DEPARTMENT_OTHER): Payer: Medicare Other | Admitting: Hematology and Oncology

## 2020-11-26 ENCOUNTER — Encounter: Payer: Self-pay | Admitting: Hematology and Oncology

## 2020-11-26 ENCOUNTER — Inpatient Hospital Stay: Payer: Medicare Other

## 2020-11-26 VITALS — BP 99/68 | HR 108 | Temp 97.8°F | Resp 16 | Wt 151.0 lb

## 2020-11-26 VITALS — BP 103/69 | HR 102

## 2020-11-26 DIAGNOSIS — R634 Abnormal weight loss: Secondary | ICD-10-CM | POA: Diagnosis not present

## 2020-11-26 DIAGNOSIS — Z7189 Other specified counseling: Secondary | ICD-10-CM

## 2020-11-26 DIAGNOSIS — K5900 Constipation, unspecified: Secondary | ICD-10-CM | POA: Diagnosis not present

## 2020-11-26 DIAGNOSIS — E860 Dehydration: Secondary | ICD-10-CM

## 2020-11-26 DIAGNOSIS — C119 Malignant neoplasm of nasopharynx, unspecified: Secondary | ICD-10-CM

## 2020-11-26 DIAGNOSIS — C113 Malignant neoplasm of anterior wall of nasopharynx: Secondary | ICD-10-CM | POA: Diagnosis not present

## 2020-11-26 LAB — COMPREHENSIVE METABOLIC PANEL
ALT: 24 U/L (ref 0–44)
AST: 25 U/L (ref 15–41)
Albumin: 4.1 g/dL (ref 3.5–5.0)
Alkaline Phosphatase: 77 U/L (ref 38–126)
Anion gap: 12 (ref 5–15)
BUN: 28 mg/dL — ABNORMAL HIGH (ref 8–23)
CO2: 25 mmol/L (ref 22–32)
Calcium: 9.6 mg/dL (ref 8.9–10.3)
Chloride: 94 mmol/L — ABNORMAL LOW (ref 98–111)
Creatinine, Ser: 0.87 mg/dL (ref 0.61–1.24)
GFR, Estimated: >60 mL/min (ref >60)
Glucose, Bld: 251 mg/dL — ABNORMAL HIGH (ref 70–99)
Potassium: 4.5 mmol/L (ref 3.5–5.1)
Sodium: 131 mmol/L — ABNORMAL LOW (ref 135–145)
Total Bilirubin: 0.8 mg/dL (ref 0.3–1.2)
Total Protein: 7.8 g/dL (ref 6.5–8.1)

## 2020-11-26 LAB — CBC WITH DIFFERENTIAL/PLATELET
Abs Immature Granulocytes: 0.12 10*3/uL — ABNORMAL HIGH (ref 0.00–0.07)
Basophils Absolute: 0 10*3/uL (ref 0.0–0.1)
Basophils Relative: 0 %
Eosinophils Absolute: 0.1 10*3/uL (ref 0.0–0.5)
Eosinophils Relative: 1 %
HCT: 35.2 % — ABNORMAL LOW (ref 39.0–52.0)
Hemoglobin: 11.9 g/dL — ABNORMAL LOW (ref 13.0–17.0)
Immature Granulocytes: 1 %
Lymphocytes Relative: 12 %
Lymphs Abs: 1.1 10*3/uL (ref 0.7–4.0)
MCH: 32.6 pg (ref 26.0–34.0)
MCHC: 33.8 g/dL (ref 30.0–36.0)
MCV: 96.4 fL (ref 80.0–100.0)
Monocytes Absolute: 0.9 10*3/uL (ref 0.1–1.0)
Monocytes Relative: 10 %
Neutro Abs: 6.7 10*3/uL (ref 1.7–7.7)
Neutrophils Relative %: 76 %
Platelets: 266 10*3/uL (ref 150–400)
RBC: 3.65 MIL/uL — ABNORMAL LOW (ref 4.22–5.81)
RDW: 17.9 % — ABNORMAL HIGH (ref 11.5–15.5)
WBC: 8.9 10*3/uL (ref 4.0–10.5)
nRBC: 0 % (ref 0.0–0.2)

## 2020-11-26 MED ORDER — SODIUM CHLORIDE 0.9 % IV SOLN
Freq: Once | INTRAVENOUS | Status: AC
  Filled 2020-11-26: qty 250

## 2020-11-26 MED ORDER — HEPARIN SOD (PORK) LOCK FLUSH 100 UNIT/ML IV SOLN
500.0000 [IU] | Freq: Once | INTRAVENOUS | Status: AC
  Administered 2020-11-26: 14:00:00 500 [IU] via INTRAVENOUS
  Filled 2020-11-26: qty 5

## 2020-11-26 NOTE — Progress Notes (Signed)
Fluids well tolerated - total of 1 liter given over 2 hours.

## 2020-11-26 NOTE — Progress Notes (Signed)
Patient has had constipation for the last 4 or 5 weeks. He is taking stool softeners but they are not helping. He is in pain due to this.

## 2020-11-30 ENCOUNTER — Inpatient Hospital Stay: Payer: Medicare Other | Attending: Hematology and Oncology

## 2020-11-30 ENCOUNTER — Other Ambulatory Visit: Payer: Self-pay

## 2020-11-30 VITALS — BP 122/67 | HR 106 | Temp 96.7°F | Resp 16 | Wt 155.4 lb

## 2020-11-30 DIAGNOSIS — Z79899 Other long term (current) drug therapy: Secondary | ICD-10-CM | POA: Diagnosis not present

## 2020-11-30 DIAGNOSIS — H903 Sensorineural hearing loss, bilateral: Secondary | ICD-10-CM | POA: Insufficient documentation

## 2020-11-30 DIAGNOSIS — Z9221 Personal history of antineoplastic chemotherapy: Secondary | ICD-10-CM | POA: Diagnosis not present

## 2020-11-30 DIAGNOSIS — K117 Disturbances of salivary secretion: Secondary | ICD-10-CM | POA: Insufficient documentation

## 2020-11-30 DIAGNOSIS — Z8522 Personal history of malignant neoplasm of nasal cavities, middle ear, and accessory sinuses: Secondary | ICD-10-CM | POA: Insufficient documentation

## 2020-11-30 DIAGNOSIS — R634 Abnormal weight loss: Secondary | ICD-10-CM | POA: Diagnosis not present

## 2020-11-30 DIAGNOSIS — Z923 Personal history of irradiation: Secondary | ICD-10-CM | POA: Diagnosis not present

## 2020-11-30 DIAGNOSIS — E860 Dehydration: Secondary | ICD-10-CM

## 2020-11-30 MED ORDER — HEPARIN SOD (PORK) LOCK FLUSH 100 UNIT/ML IV SOLN
500.0000 [IU] | Freq: Once | INTRAVENOUS | Status: AC | PRN
  Administered 2020-11-30: 500 [IU]
  Filled 2020-11-30: qty 5

## 2020-11-30 MED ORDER — SODIUM CHLORIDE 0.9 % IV SOLN
Freq: Once | INTRAVENOUS | Status: AC
  Filled 2020-11-30: qty 250

## 2020-11-30 MED ORDER — SODIUM CHLORIDE 0.9% FLUSH
10.0000 mL | Freq: Once | INTRAVENOUS | Status: AC | PRN
  Administered 2020-11-30: 10 mL
  Filled 2020-11-30: qty 10

## 2020-11-30 NOTE — Progress Notes (Signed)
Patient received 2 hours of IV fluids, I liter total. Tolerated well. Patient stable at discharge.

## 2020-12-01 ENCOUNTER — Inpatient Hospital Stay: Payer: Medicare Other

## 2020-12-01 VITALS — BP 117/77 | HR 88

## 2020-12-01 DIAGNOSIS — E860 Dehydration: Secondary | ICD-10-CM

## 2020-12-01 DIAGNOSIS — Z8522 Personal history of malignant neoplasm of nasal cavities, middle ear, and accessory sinuses: Secondary | ICD-10-CM | POA: Diagnosis not present

## 2020-12-01 MED ORDER — SODIUM CHLORIDE 0.9 % IV SOLN
Freq: Once | INTRAVENOUS | Status: AC
  Filled 2020-12-01: qty 250

## 2020-12-01 MED ORDER — SODIUM CHLORIDE 0.9% FLUSH
10.0000 mL | Freq: Once | INTRAVENOUS | Status: AC | PRN
  Administered 2020-12-01: 10 mL
  Filled 2020-12-01: qty 10

## 2020-12-01 MED ORDER — HEPARIN SOD (PORK) LOCK FLUSH 100 UNIT/ML IV SOLN
500.0000 [IU] | Freq: Once | INTRAVENOUS | Status: AC | PRN
Start: 2020-12-01 — End: 2020-12-01
  Administered 2020-12-01: 500 [IU]
  Filled 2020-12-01: qty 5

## 2020-12-01 NOTE — Progress Notes (Signed)
Patient received prescribed treatment in clinic. 1 liter of NS over 2 hours. Tolerated well. Patient stable at discharge. 

## 2020-12-02 ENCOUNTER — Inpatient Hospital Stay: Payer: Medicare Other

## 2020-12-02 ENCOUNTER — Ambulatory Visit
Admission: RE | Admit: 2020-12-02 | Discharge: 2020-12-02 | Disposition: A | Discharge status: Home / Self Care | Payer: Medicare Other | Source: Ambulatory Visit | Attending: Radiation Oncology | Admitting: Radiation Oncology

## 2020-12-02 ENCOUNTER — Encounter: Payer: Self-pay | Admitting: Radiation Oncology

## 2020-12-02 ENCOUNTER — Other Ambulatory Visit: Payer: Self-pay

## 2020-12-02 VITALS — BP 134/60 | HR 93 | Temp 96.1°F | Wt 156.0 lb

## 2020-12-02 VITALS — BP 127/81 | HR 92 | Temp 96.7°F | Resp 18

## 2020-12-02 DIAGNOSIS — E860 Dehydration: Secondary | ICD-10-CM

## 2020-12-02 DIAGNOSIS — C119 Malignant neoplasm of nasopharynx, unspecified: Secondary | ICD-10-CM

## 2020-12-02 DIAGNOSIS — Z8522 Personal history of malignant neoplasm of nasal cavities, middle ear, and accessory sinuses: Secondary | ICD-10-CM | POA: Diagnosis not present

## 2020-12-02 LAB — BASIC METABOLIC PANEL
Anion gap: 9 (ref 5–15)
BUN: 22 mg/dL (ref 8–23)
CO2: 27 mmol/L (ref 22–32)
Calcium: 8.8 mg/dL — ABNORMAL LOW (ref 8.9–10.3)
Chloride: 98 mmol/L (ref 98–111)
Creatinine, Ser: 0.59 mg/dL — ABNORMAL LOW (ref 0.61–1.24)
GFR, Estimated: >60 mL/min (ref >60)
Glucose, Bld: 204 mg/dL — ABNORMAL HIGH (ref 70–99)
Potassium: 4.4 mmol/L (ref 3.5–5.1)
Sodium: 134 mmol/L — ABNORMAL LOW (ref 135–145)

## 2020-12-02 MED ORDER — SODIUM CHLORIDE 0.9% FLUSH
10.0000 mL | INTRAVENOUS | Status: DC | PRN
  Administered 2020-12-02: 10 mL via INTRAVENOUS
  Filled 2020-12-02: qty 10

## 2020-12-02 MED ORDER — HEPARIN SOD (PORK) LOCK FLUSH 100 UNIT/ML IV SOLN
500.0000 [IU] | Freq: Once | INTRAVENOUS | Status: AC
  Administered 2020-12-02: 500 [IU] via INTRAVENOUS
  Filled 2020-12-02: qty 5

## 2020-12-02 MED ORDER — SODIUM CHLORIDE 0.9 % IV SOLN
Freq: Once | INTRAVENOUS | Status: AC
  Filled 2020-12-02: qty 250

## 2020-12-02 NOTE — Progress Notes (Signed)
[facility]  [address] [place], [place] [address] Phone: [phone]  Fax: [phone]   Clinic Day:  12/03/2020  Referring physician: Etheleen Nicks, NP  Chief Complaint: Kenneth Franklin is a 75 y.o. male with stage III nasopharyngeal carcinoma who is seen for 1 week assessment.  HPI: The patient was last seen in the oncology clinic on 11/26/2020. At that time, he felt "ok".  He described no appetite, as he could not taste anything, and constipation.  Oral pain had resolved.  He had lost 18 pounds in 2 weeks.  Hematocrit was 35.2, hemoglobin 11.9, MCV 96.4, platelets 266,000, WBC 8,900. Sodium was 131. BUN was 28 and creatinine 1.0.  He was dehydrated.  We discussed the importance of caloric and fluid intake and management of constipation.  He was referred to Elouise Munroe, NP.  He received daily IVF (11/26/2020 - 12/01/2020).  The patient followed up with Dr. Rushie Chestnut on 12/02/2020.  He noted only slight dysphagia.  He has a follow-up in 3-4 months.  During the interim, he has been much better. He is eating a little bit better but still cannot taste. He drinks water and 4 Boosts per day. His constipation has resolved with Miralax. His shortness of breath and headaches have also resolved.  Dr. Rushie Chestnut wants him to go back to see Dr. Elenore Rota.  He would like to receive IV fluids today.   Past Medical History:  Diagnosis Date  . Acute ischemic stroke (HCC) 2017  . Alcohol abuse   . Anxiety   . Cancer (HCC)   . Depression   . Diabetes mellitus without complication (HCC)   . Dyspnea    pcp knows and ordered rescue inhaler  . Hyperlipidemia   . Hypertension   . Skin cancer    Squamous Cell Carcinoma In Situ    Past Surgical History:  Procedure Laterality Date  . CATARACT EXTRACTION Bilateral   . COLONOSCOPY    . NASOPHARYNGOSCOPY N/A 08/12/2020   Procedure: ENDOSCOPIC NASOPHARYNGOSCOPY WITH BIOPSY;  Surgeon: Vernie Murders, MD;  Location:  [facility] ORS;  Service: ENT;  Laterality: N/A;  . PORTA CATH INSERTION N/A 08/24/2020   Procedure: PORTA CATH INSERTION;  Surgeon: Annice Needy, MD;  Location: [facility] INVASIVE CV LAB;  Service: Cardiovascular;  Laterality: N/A;    Family History  Problem Relation Age of Onset  . Diabetes Brother     Social History:  reports that he quit smoking about 15 months ago. His smoking use included cigars and cigarettes. He has a 40.00 pack-year smoking history. He has never used smokeless tobacco. He reports previous alcohol use. He reports that he does not use drugs. He smoked 1 pack/day x 40 years.  He denies exposure of radiation or toxins; he did work with asphalt. He lives alone in [place]. He is widowed. The patient is accompanied by his step daughter, Kenneth Franklin, today.  Allergies:  Allergies  Allergen Reactions  . Propoxyphene     Unknown reaction    Current Medications: Current Outpatient Medications  Medication Sig Dispense Refill  . ALPRAZolam (XANAX) 0.25 MG tablet TAKE 1 TABLET EVERY DAY AS NEEDED FOR ANXIETY (Patient taking differently: Take 0.25 mg by mouth daily as needed for anxiety.) 30 tablet 2  . aspirin EC 81 MG tablet Take 81 mg by mouth daily. Swallow whole.    Marland Kitchen atorvastatin (LIPITOR) 80 MG tablet TAKE 1 TABLET (80 MG TOTAL) BY MOUTH NIGHTLY. (Patient taking differently: Take 80 mg  by mouth every evening.) 90 tablet 0  . busPIRone (BUSPAR) 10 MG tablet TAKE 1 TABLET (10 MG TOTAL) BY MOUTH 2 (TWO) TIMES DAILY. (Patient taking differently: Take 10 mg by mouth at bedtime.) 180 tablet 0  . clopidogrel (PLAVIX) 75 MG tablet TAKE 1 TABLET (75 MG TOTAL) BY MOUTH DAILY. 90 tablet 0  . DENTA 5000 PLUS 1.1 % CREA dental cream Place 1 application onto teeth at bedtime.     Marland Kitchen dexamethasone (DECADRON) 2 MG tablet Take 1 tablet (2 mg total) by mouth daily. 5 tablet 0  . escitalopram (LEXAPRO) 20 MG tablet Take 20 mg by mouth every morning.     . feeding supplement (ENSURE ENLIVE / ENSURE  PLUS) LIQD Take 237 mLs by mouth 3 (three) times daily between meals. 237 mL 12  . fluticasone (FLOVENT HFA) 110 MCG/ACT inhaler Inhale 2 puffs into the lungs daily as needed (shortness of breath).    Marland Kitchen glipiZIDE (GLUCOTROL) 5 MG tablet Take 5 mg by mouth daily before breakfast.     . HYDROcodone-acetaminophen (NORCO) 10-325 MG tablet Take 0.5 tablets by mouth every 4 (four) hours as needed for severe pain. 30 tablet 0  . lidocaine (XYLOCAINE) 2 % solution USE AS DIRECTED 5 MLS IN THE MOUTH OR THROAT EVERY 6 (SIX) HOURS AS NEEDED FOR MOUTH PAIN. 100 mL 0  . lidocaine-prilocaine (EMLA) cream Apply 1 application topically as needed. Apply small amount to port site at least 1 hour prior to it being accessed, cover with plastic wrap 30 g 1  . losartan (COZAAR) 100 MG tablet TAKE 1 TABLET (100 MG TOTAL) BY MOUTH DAILY. (Patient taking differently: Take 100 mg by mouth every morning.) 90 tablet 0  . magic mouthwash w/lidocaine SOLN Take 5 mLs by mouth 4 (four) times daily as needed for mouth pain. Sig: Swish/Spit 5-10 ml four times a day as needed. Dispense 480 ml. 1RF 480 mL 1  . magnesium oxide (MAG-OX) 400 MG tablet TAKE 1 TABLET BY MOUTH EVERY DAY 90 tablet 1  . metFORMIN (GLUCOPHAGE) 500 MG tablet Take 1 tablet (500 mg total) by mouth daily with breakfast. 90 tablet 0  . mirtazapine (REMERON) 30 MG tablet Take 1 tablet (30 mg total) by mouth at bedtime. 30 tablet 0  . nystatin cream (MYCOSTATIN) Apply 1 application topically 2 (two) times daily as needed (yeast).     . ondansetron (ZOFRAN) 8 MG tablet Take 1 tablet (8 mg total) by mouth 2 (two) times daily as needed. Start on the third day after cisplatin chemotherapy. 30 tablet 1  . pantoprazole (PROTONIX) 20 MG tablet Take 20 mg by mouth daily before lunch.     . phenol (CHLORASEPTIC) 1.4 % LIQD Use as directed 1 spray in the mouth or throat as needed for throat irritation / pain.  0  . polyvinyl alcohol (LIQUIFILM TEARS) 1.4 % ophthalmic solution  Place 1 drop into both eyes as needed for dry eyes. 15 mL 0  . prochlorperazine (COMPAZINE) 10 MG tablet Take 1 tablet (10 mg total) by mouth every 6 (six) hours as needed (Nausea or vomiting). 30 tablet 1  . sitaGLIPtin (JANUVIA) 100 MG tablet Take 100 mg by mouth every morning.     . sodium chloride (OCEAN) 0.65 % SOLN nasal spray Place 1 spray into both nostrils as needed for congestion.  0  . sodium fluoride (LURIDE) 1.1 (0.5 F) MG/ML SOLN Take 1 drop by mouth at bedtime. One dental application at bedtime.    Marland Kitchen  sucralfate (CARAFATE) 1 g tablet Take 1 tablet (1 g total) by mouth 3 (three) times daily. Dissolve in 3-4 tbsp warm water, swish and swallow. 90 tablet 3  . VITAMIN D PO Take 1 capsule by mouth daily.    Marland Kitchen dexamethasone (DECADRON) 4 MG tablet Take 2 tablets (8 mg total) by mouth daily. Take daily x 3 days starting the day after cisplatin chemotherapy. Take with food. (Patient not taking: No sig reported) 30 tablet 1  . escitalopram (LEXAPRO) 10 MG tablet Take 1 tablet (10 mg total) by mouth daily. Stop your lexapro in a week 6 tablet 0  . sucralfate (CARAFATE) 1 g tablet Take 1 tablet (1 g total) by mouth 4 (four) times daily. 120 tablet 11   No current facility-administered medications for this visit.    Review of Systems  Constitutional: Negative for chills, diaphoresis, fever, malaise/fatigue and weight loss (up 6 lbs).  HENT: Negative for congestion, ear discharge, ear pain, hearing loss, nosebleeds, sinus pain, sore throat and tinnitus.        No sense of taste.  Eyes: Negative.  Negative for blurred vision.  Respiratory: Negative.  Negative for cough, hemoptysis, sputum production and shortness of breath.   Cardiovascular: Negative.  Negative for chest pain, palpitations and leg swelling.  Gastrointestinal: Negative for abdominal pain, blood in stool, constipation, diarrhea, heartburn, melena, nausea and vomiting.       He stays hydrated. Drinks 4 Boosts per day.   Genitourinary: Negative.  Negative for dysuria, frequency, hematuria and urgency.  Musculoskeletal: Negative.  Negative for back pain, joint pain, myalgias and neck pain.  Skin: Negative.  Negative for itching and rash.  Neurological: Negative.  Negative for dizziness, tingling, sensory change, weakness and headaches.  Endo/Heme/Allergies: Negative.  Does not bruise/bleed easily.  Psychiatric/Behavioral: Negative.  Negative for depression and memory loss. The patient is not nervous/anxious and does not have insomnia.   All other systems reviewed and are negative.  Performance status (ECOG): 1-2  Vitals Blood pressure 114/74, pulse (!) 101, temperature (!) 97.2 F (36.2 C), temperature source Tympanic, resp. rate 18, weight 157 lb 10.1 oz (71.5 kg), SpO2 98 %.   Physical Exam Vitals and nursing note reviewed.  Constitutional:      General: He is not in acute distress.    Appearance: Normal appearance. He is well-developed.     Interventions: Face mask in place.     Comments: Gentleman sitting in wheelchair in no acute distress. Cane by his side. Able to get onto exam table without assistance.  HENT:     Head: Normocephalic and atraumatic.     Comments: Cap. Gray hair.    Right Ear: Hearing normal.     Left Ear: Hearing normal.     Mouth/Throat:     Mouth: Mucous membranes are moist. No oral lesions.     Comments: White coating in mouth, patient just took a Tums Eyes:     General: No scleral icterus.    Extraocular Movements: Extraocular movements intact.     Conjunctiva/sclera: Conjunctivae normal.     Pupils: Pupils are equal, round, and reactive to light.  Cardiovascular:     Rate and Rhythm: Normal rate and regular rhythm.     Heart sounds: Normal heart sounds. No murmur heard. No friction rub. No gallop.   Pulmonary:     Effort: Pulmonary effort is normal.     Breath sounds: Normal breath sounds. No wheezing, rhonchi or rales.  Chest:  Breasts:  Right: No axillary  adenopathy or supraclavicular adenopathy.     Left: No axillary adenopathy or supraclavicular adenopathy.    Abdominal:     General: Bowel sounds are normal. There is no distension.     Palpations: Abdomen is soft. There is no hepatomegaly, splenomegaly or mass.     Tenderness: There is no abdominal tenderness. There is no guarding or rebound.  Musculoskeletal:        General: No tenderness. Normal range of motion.     Cervical back: Normal range of motion and neck supple.  Lymphadenopathy:     Head:     Right side of head: No preauricular, posterior auricular or occipital adenopathy.     Left side of head: No preauricular, posterior auricular or occipital adenopathy.     Cervical: No cervical adenopathy.     Upper Body:     Right upper body: No supraclavicular or axillary adenopathy.     Left upper body: No supraclavicular or axillary adenopathy.     Lower Body: No right inguinal adenopathy. No left inguinal adenopathy.  Skin:    General: Skin is warm and dry.     Findings: No bruising, erythema, lesion or rash.  Neurological:     Mental Status: He is alert and oriented to person, place, and time.  Psychiatric:        Behavior: Behavior normal.        Thought Content: Thought content normal.        Judgment: Judgment normal.     Infusion on 12/02/2020  Component Date Value Ref Range Status  . Sodium 12/02/2020 134* 135 - 145 mmol/L Final  . Potassium 12/02/2020 4.4  3.5 - 5.1 mmol/L Final  . Chloride 12/02/2020 98  98 - 111 mmol/L Final  . CO2 12/02/2020 27  22 - 32 mmol/L Final  . Glucose, Bld 12/02/2020 204* 70 - 99 mg/dL Final   Glucose reference range applies only to samples taken after fasting for at least 8 hours.  . BUN 12/02/2020 22  8 - 23 mg/dL Final  . Creatinine, Ser 12/02/2020 0.59* 0.61 - 1.24 mg/dL Final  . Calcium 12/02/2020 8.8* 8.9 - 10.3 mg/dL Final  . GFR, Estimated 12/02/2020 >60  >60 mL/min Final   Comment: (NOTE) Calculated using the CKD-EPI  Creatinine Equation (2021)   . Anion gap 12/02/2020 9  5 - 15 Final   Performed at [facility], [address] [place], [place] [address]    Assessment:  Kenneth Franklin is a 75 y.o. 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  Pathology revealed keratinizing squamous cell carcinoma, moderately differentiated.   Head MRI on 08/13/2020 revealed a 5.9 cm nasopharyngeal mass consistent with known nasopharyngeal carcinoma with involvement of the clivus and sphenoid sinus. There was moderate chronic small vessel ischemic disease and cerebral atrophy.  There was no intracranial extension.  PET scan on 08/18/2020 revealed a 5.7 x 4.2 cm destructive hypermetabolic mass (SUV [id number]) involving the sphenoid sinus, skull base and posterior nasopharynx.  This was consistent with patient's known squamous cell carcinoma. There was no locoregional lymphadenopathy or distant metastatic disease.  He received IMRT from 09/08/2020 - 10/21/2020.  He received 5 weeks of cisplatin (09/08/2020 - 09/29/2020; 10/13/2020 - 10/20/2020).  He did not receive treatment on 10/06/2020 secondary to poor urine output.  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  He was admitted to [facility] from 10/25/2020 - 11/01/2020 with mucositis.  He was treated with Magic mouthwash, viscous lidocaine, IV morphine and Toradol.  Symptomatically,  he has been feeling better. He is eating a little bit better but still cannot taste. He drinks water and 4 Boosts per day. His constipation has resolved with Miralax. His shortness of breath and headaches have resolved.  Plan: 1.   Review labs from 12/03/2019. 2.   Clinical [id number] nasopharyngeal carcinoma             Tumor was unresectable.             PET scan revealed no evidence of distant disease             Treatment plan: upfront radiation  and chemotherapy followed by adjuvant chemotherapy                         Cisplatin 40 mg/m2 weekly x 6-7 with radiation followed by cisplatin 80 mg/m2 D1 + 5FU 1000 mg/m2/day CI D1-4 q 28 days x 3 cycles             Baseline audiogram confirmed hearing loss associated with prior exposure.             He received 5 weeks of cisplatin with radiation (completed 10/21/2020).                         Decision made by radiation oncology to truncate radiation by 3 fractions secondary to mucositis.             Discuss plan for repeat endoscopy by Dr. Farrel Conners as well as repeat audiogram   Symptomatically,he is feeling better.  He has gained weight.   He continues to experience a lack of taste.  Discuss symptom management.  He has antiemetics at home to use on a prn bases.  Interventions are adequate.    3.   Mucositis             Mucositis has resolved.             He does not require pain medications. 4.   Dehydration            He has continued to require IV fluids during the interim.  He request IV fluids today. 5.   Weight loss  Patient has lost 18 pounds since 11/12/2020.             Food has no taste.            He has gained 6 pounds back since his last visit.  Continue to encourage caloric intake as well as various food choices. 6.   IVF 1 liter over 2 hours. 7.   Patient would like to try no IVF next week. 8.   Please schedule follow-up appt next week with Dr Kathyrn Sheriff for endoscopy of upper airway and audiogram. 9.   RTC on 12/14/2020 for MD assessment, review of endscopy, and audiogram, and +/- IVF.  I discussed the assessment and treatment plan with the patient.  The patient was provided an opportunity to ask questions and all were answered.  The patient agreed with the plan and demonstrated an understanding of the instructions.  The patient was advised to call back if the symptoms worsen or if the condition fails to improve as anticipated.   Adonis Yim C. Mike Gip, MD, PhD    12/03/2020,  11:12 AM  I, Mirian Mo Tufford, am acting as Education administrator for [employer]. Mike Gip, MD, PhD.  I, Tynlee Bayle C. Mike Gip, MD, have reviewed the above documentation for accuracy and completeness, and I agree with the above.

## 2020-12-02 NOTE — Progress Notes (Signed)
Patient received prescribed treatment in clinic. 1 liter of NS over 2 hours. Tolerated well. Patient stable at discharge.

## 2020-12-02 NOTE — Progress Notes (Signed)
Radiation Oncology Follow up Note  Name: Kenneth Franklin   Date:   12/02/2020 MRN:  [id number] DOB: [birth year]    This 75 y.o. male presents to the clinic today for 1 month follow-up status post concurrent chemoradiation therapy for.  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  He is seen today in routine follow-up and is doing fair.  He was hospitalized beginning of December with significant problems with dysphagia.  He continues to receive fluids through Dr. Danton Sewer office.  He states he is much improved with only slight dysphagia at this time no head and neck pain.  He does have loss of taste..  COMPLICATIONS OF TREATMENT: none  FOLLOW UP COMPLIANCE: keeps appointments   PHYSICAL EXAM:  BP 134/60   Pulse 93   Temp (!) 96.1 F (35.6 C) (Tympanic)   Wt 156 lb (70.8 kg)   BMI 21.16 kg/m  Frail-appearing wheelchair-bound male in NAD.  No evidence of cervical or supraclavicular adenopathy is noted.  Well-developed well-nourished patient in NAD. HEENT reveals PERLA, EOMI, discs not visualized.  Oral cavity is clear. No oral mucosal lesions are identified. Neck is clear without evidence of cervical or supraclavicular adenopathy. Lungs are clear to A&P. Cardiac examination is essentially unremarkable with regular rate and rhythm without murmur rub or thrill. Abdomen is benign with no organomegaly or masses noted. Motor sensory and DTR levels are equal and symmetric in the upper and lower extremities. Cranial nerves II through XII are grossly intact. Proprioception is intact. No peripheral adenopathy or edema is identified. No motor or sensory levels are noted. Crude visual fields are within normal range.  RADIOLOGY RESULTS: No current  films to review  PLAN: Present time patient is recovering fairly well from his concurrent chemoradiation therapy for locally advanced nasopharyngeal carcinoma.  I have asked to see him back in 3 to 4 months.  Of asked to make a follow-up appointment with Dr. Ernestine Mcmurray.  He continues close follow-up care and receiving fluids through Dr. Merlene Pulling.  Would expect to see his PET CT scan in the next several months which I will review when it becomes available.  Patient and family know to call with any concerns.  I would like to take this opportunity to thank you for allowing me to participate in the care of your patient.Carmina Miller, MD

## 2020-12-03 ENCOUNTER — Inpatient Hospital Stay: Payer: Medicare Other

## 2020-12-03 ENCOUNTER — Inpatient Hospital Stay (HOSPITAL_BASED_OUTPATIENT_CLINIC_OR_DEPARTMENT_OTHER): Payer: Medicare Other | Admitting: Hematology and Oncology

## 2020-12-03 ENCOUNTER — Encounter: Payer: Self-pay | Admitting: Hematology and Oncology

## 2020-12-03 VITALS — BP 114/74 | HR 101 | Temp 97.2°F | Resp 18 | Wt 157.6 lb

## 2020-12-03 DIAGNOSIS — Z7189 Other specified counseling: Secondary | ICD-10-CM

## 2020-12-03 DIAGNOSIS — C119 Malignant neoplasm of nasopharynx, unspecified: Secondary | ICD-10-CM

## 2020-12-03 DIAGNOSIS — R634 Abnormal weight loss: Secondary | ICD-10-CM | POA: Insufficient documentation

## 2020-12-03 DIAGNOSIS — R531 Weakness: Secondary | ICD-10-CM | POA: Diagnosis not present

## 2020-12-03 DIAGNOSIS — Z8522 Personal history of malignant neoplasm of nasal cavities, middle ear, and accessory sinuses: Secondary | ICD-10-CM | POA: Diagnosis not present

## 2020-12-03 DIAGNOSIS — R630 Anorexia: Secondary | ICD-10-CM | POA: Diagnosis not present

## 2020-12-03 DIAGNOSIS — E860 Dehydration: Secondary | ICD-10-CM | POA: Diagnosis not present

## 2020-12-03 DIAGNOSIS — Z95828 Presence of other vascular implants and grafts: Secondary | ICD-10-CM

## 2020-12-03 DIAGNOSIS — K5900 Constipation, unspecified: Secondary | ICD-10-CM | POA: Insufficient documentation

## 2020-12-03 MED ORDER — SODIUM CHLORIDE 0.9 % IV SOLN
Freq: Once | INTRAVENOUS | Status: AC
  Filled 2020-12-03: qty 250

## 2020-12-03 MED ORDER — HEPARIN SOD (PORK) LOCK FLUSH 100 UNIT/ML IV SOLN
500.0000 [IU] | Freq: Once | INTRAVENOUS | Status: AC
  Administered 2020-12-03: 500 [IU] via INTRAVENOUS
  Filled 2020-12-03: qty 5

## 2020-12-03 MED ORDER — SODIUM CHLORIDE 0.9% FLUSH
10.0000 mL | INTRAVENOUS | Status: AC | PRN
  Administered 2020-12-03: 10 mL via INTRAVENOUS
  Filled 2020-12-03: qty 10

## 2020-12-03 NOTE — Progress Notes (Unsigned)
Patient received prescribed treatment in clinic. (1 Liter of NS over 2 hrs.) Tolerated well. Patient stable at discharge.

## 2020-12-07 ENCOUNTER — Telehealth: Payer: Self-pay

## 2020-12-07 ENCOUNTER — Encounter: Payer: Medicare Other | Admitting: Hospice and Palliative Medicine

## 2020-12-07 ENCOUNTER — Inpatient Hospital Stay: Payer: Medicare Other | Attending: Hematology and Oncology

## 2020-12-07 NOTE — Telephone Encounter (Addendum)
Nutrition  Called patient for schedule phone nutrition follow-up visit on  Number listed in chart (dtr's phone).  No answer. Left message with Otila Kluver and call back number.  Tina called RD back and provided patient's number for RD to speak with patient ([phone]).    RD called patient at 9:20am.  No answer. Left message for patient with call back number.   Madilyne Tadlock B. Zenia Resides, [place], [place] Registered Dietitian [phone] (mobile)

## 2020-12-14 ENCOUNTER — Inpatient Hospital Stay: Payer: Medicare Other

## 2020-12-14 ENCOUNTER — Inpatient Hospital Stay: Payer: Medicare Other | Admitting: Hematology and Oncology

## 2020-12-14 DIAGNOSIS — C119 Malignant neoplasm of nasopharynx, unspecified: Secondary | ICD-10-CM

## 2020-12-14 DIAGNOSIS — J441 Chronic obstructive pulmonary disease with (acute) exacerbation: Secondary | ICD-10-CM

## 2020-12-14 DIAGNOSIS — H9190 Unspecified hearing loss, unspecified ear: Secondary | ICD-10-CM

## 2020-12-14 DIAGNOSIS — R630 Anorexia: Secondary | ICD-10-CM

## 2020-12-15 ENCOUNTER — Other Ambulatory Visit: Payer: Self-pay | Admitting: Hematology and Oncology

## 2020-12-15 DIAGNOSIS — C119 Malignant neoplasm of nasopharynx, unspecified: Secondary | ICD-10-CM

## 2020-12-15 NOTE — Progress Notes (Incomplete)
Kenneth Franklin  [address], [address] [place], [place] [address] Phone: [phone]  Fax: [phone]   Clinic Day:  12/15/2020  Referring physician: Verita Lamb, NP  Chief Complaint: Kenneth Franklin is a 75 y.o. male with stage III nasopharyngeal carcinoma who is seen for 1 week assessment, and review of endoscopy and audiogram.  HPI: The patient was last seen in the oncology clinic on 12/04/2019. At that time, he was feeling much better.  He was eating a little bit, but could not taste,  He was drinking water and 4 Boosts/day.  He received IV fluids.  Kenneth Franklin, RD called the patient on 12/07/2020. There was no answer.  During the interim, ***   Past Medical History:  Diagnosis Date  . Acute ischemic stroke ([place]) 2017  . Alcohol abuse   . Anxiety   . Cancer ([place])   . Depression   . Diabetes mellitus without complication ([place])   . Dyspnea    pcp knows and ordered rescue inhaler  . Hyperlipidemia   . Hypertension   . Skin cancer    Squamous Cell Carcinoma In Situ    Past Surgical History:  Procedure Laterality Date  . CATARACT EXTRACTION Bilateral   . COLONOSCOPY    . NASOPHARYNGOSCOPY N/A 08/12/2020   Procedure: ENDOSCOPIC NASOPHARYNGOSCOPY WITH BIOPSY;  Surgeon: Kenneth Sheffield, MD;  Location: [facility];  Service: ENT;  Laterality: N/A;  . PORTA CATH INSERTION N/A 08/24/2020   Procedure: PORTA CATH INSERTION;  Surgeon: Kenneth Huxley, MD;  Location: [facility];  Service: Cardiovascular;  Laterality: N/A;    Family History  Problem Relation Age of Onset  . Diabetes Brother     Social History:  reports that he quit smoking about 16 months ago. His smoking use included cigars and cigarettes. He has a 40.00 pack-year smoking history. He has never used smokeless tobacco. He reports previous alcohol use. He reports that he does not use drugs. He smoked 1 pack/day x 40 years.  He denies exposure of radiation or toxins; he did work with  asphalt. He lives alone in [place]. He is widowed. The patient is accompanied by his step daughter, Kenneth Franklin,*** today.  Allergies:  Allergies  Allergen Reactions  . Propoxyphene     Unknown reaction    Current Medications: Current Outpatient Medications  Medication Sig Dispense Refill  . ALPRAZolam (XANAX) 0.25 MG tablet TAKE 1 TABLET EVERY DAY AS NEEDED FOR ANXIETY (Patient taking differently: Take 0.25 mg by mouth daily as needed for anxiety.) 30 tablet 2  . aspirin EC 81 MG tablet Take 81 mg by mouth daily. Swallow whole.    Marland Kitchen atorvastatin (LIPITOR) 80 MG tablet TAKE 1 TABLET (80 MG TOTAL) BY MOUTH NIGHTLY. (Patient taking differently: Take 80 mg by mouth every evening.) 90 tablet 0  . busPIRone (BUSPAR) 10 MG tablet TAKE 1 TABLET (10 MG TOTAL) BY MOUTH 2 (TWO) TIMES DAILY. (Patient taking differently: Take 10 mg by mouth at bedtime.) 180 tablet 0  . clopidogrel (PLAVIX) 75 MG tablet TAKE 1 TABLET (75 MG TOTAL) BY MOUTH DAILY. 90 tablet 0  . DENTA 5000 PLUS 1.1 % CREA dental cream Place 1 application onto teeth at bedtime.     Marland Kitchen dexamethasone (DECADRON) 2 MG tablet Take 1 tablet (2 mg total) by mouth daily. 5 tablet 0  . dexamethasone (DECADRON) 4 MG tablet Take 2 tablets (8 mg total) by mouth daily. Take daily x 3 days starting the  day after cisplatin chemotherapy. Take with food. (Patient not taking: No sig reported) 30 tablet 1  . escitalopram (LEXAPRO) 10 MG tablet Take 1 tablet (10 mg total) by mouth daily. Stop your lexapro in a week 6 tablet 0  . escitalopram (LEXAPRO) 20 MG tablet Take 20 mg by mouth every morning.     . feeding supplement (ENSURE ENLIVE / ENSURE PLUS) LIQD Take 237 mLs by mouth 3 (three) times daily between meals. 237 mL 12  . fluticasone (FLOVENT HFA) 110 MCG/ACT inhaler Inhale 2 puffs into the lungs daily as needed (shortness of breath).    Marland Kitchen glipiZIDE (GLUCOTROL) 5 MG tablet Take 5 mg by mouth daily before breakfast.     . HYDROcodone-acetaminophen  (NORCO) 10-325 MG tablet Take 0.5 tablets by mouth every 4 (four) hours as needed for severe pain. 30 tablet 0  . lidocaine (XYLOCAINE) 2 % solution USE AS DIRECTED 5 MLS IN THE MOUTH OR THROAT EVERY 6 (SIX) HOURS AS NEEDED FOR MOUTH PAIN. 100 mL 0  . lidocaine-prilocaine (EMLA) cream Apply 1 application topically as needed. Apply small amount to port site at least 1 hour prior to it being accessed, cover with plastic wrap 30 g 1  . losartan (COZAAR) 100 MG tablet TAKE 1 TABLET (100 MG TOTAL) BY MOUTH DAILY. (Patient taking differently: Take 100 mg by mouth every morning.) 90 tablet 0  . magic mouthwash w/lidocaine SOLN Take 5 mLs by mouth 4 (four) times daily as needed for mouth pain. Sig: Swish/Spit 5-10 ml four times a day as needed. Dispense 480 ml. 1RF 480 mL 1  . magnesium oxide (MAG-OX) 400 MG tablet TAKE 1 TABLET BY MOUTH EVERY DAY 90 tablet 1  . metFORMIN (GLUCOPHAGE) 500 MG tablet Take 1 tablet (500 mg total) by mouth daily with breakfast. 90 tablet 0  . mirtazapine (REMERON) 30 MG tablet Take 1 tablet (30 mg total) by mouth at bedtime. 30 tablet 0  . nystatin cream (MYCOSTATIN) Apply 1 application topically 2 (two) times daily as needed (yeast).     . ondansetron (ZOFRAN) 8 MG tablet Take 1 tablet (8 mg total) by mouth 2 (two) times daily as needed. Start on the third day after cisplatin chemotherapy. 30 tablet 1  . pantoprazole (PROTONIX) 20 MG tablet Take 20 mg by mouth daily before lunch.     . phenol (CHLORASEPTIC) 1.4 % LIQD Use as directed 1 spray in the mouth or throat as needed for throat irritation / pain.  0  . polyvinyl alcohol (LIQUIFILM TEARS) 1.4 % ophthalmic solution Place 1 drop into both eyes as needed for dry eyes. 15 mL 0  . prochlorperazine (COMPAZINE) 10 MG tablet Take 1 tablet (10 mg total) by mouth every 6 (six) hours as needed (Nausea or vomiting). 30 tablet 1  . sitaGLIPtin (JANUVIA) 100 MG tablet Take 100 mg by mouth every morning.     . sodium chloride (OCEAN)  0.65 % SOLN nasal spray Place 1 spray into both nostrils as needed for congestion.  0  . sodium fluoride (LURIDE) 1.1 (0.5 F) MG/ML SOLN Take 1 drop by mouth at bedtime. One dental application at bedtime.    . sucralfate (CARAFATE) 1 g tablet Take 1 tablet (1 g total) by mouth 4 (four) times daily. 120 tablet 11  . sucralfate (CARAFATE) 1 g tablet Take 1 tablet (1 g total) by mouth 3 (three) times daily. Dissolve in 3-4 tbsp warm water, swish and swallow. 90 tablet 3  . VITAMIN  D PO Take 1 capsule by mouth daily.     No current facility-administered medications for this visit.   Facility-Administered Medications Ordered in Other Visits  Medication Dose Route Frequency Provider Last Rate Last Admin  . sodium chloride flush (NS) 0.9 % injection 10 mL  10 mL Intravenous PRN Nolon Stalls C, MD   10 mL at 12/03/20 1138    Review of Systems  Constitutional: Negative for chills, diaphoresis, fever, malaise/fatigue and weight loss (up 6 lbs).  HENT: Negative for congestion, ear discharge, ear pain, hearing loss, nosebleeds, sinus pain, sore throat and tinnitus.        No sense of taste.  Eyes: Negative.  Negative for blurred vision.  Respiratory: Negative.  Negative for cough, hemoptysis, sputum production and shortness of breath.   Cardiovascular: Negative.  Negative for chest pain, palpitations and leg swelling.  Gastrointestinal: Negative for abdominal pain, blood in stool, constipation, diarrhea, heartburn, melena, nausea and vomiting.       He stays hydrated. Drinks 4 Boosts per day.  Genitourinary: Negative.  Negative for dysuria, frequency, hematuria and urgency.  Musculoskeletal: Negative.  Negative for back pain, joint pain, myalgias and neck pain.  Skin: Negative.  Negative for itching and rash.  Neurological: Negative.  Negative for dizziness, tingling, sensory change, weakness and headaches.  Endo/Heme/Allergies: Negative.  Does not bruise/bleed easily.  Psychiatric/Behavioral:  Negative.  Negative for depression and memory loss. The patient is not nervous/anxious and does not have insomnia.   All other systems reviewed and are negative.  Performance status (ECOG): 1-2***  Vitals There were no vitals taken for this visit.   Physical Exam Vitals and nursing note reviewed.  Constitutional:      General: He is not in acute distress.    Appearance: Normal appearance. He is well-developed.     Interventions: Face mask in place.     Comments: Gentleman sitting in wheelchair in no acute distress. Cane by his side. Able to get onto exam table without assistance.  HENT:     Head: Normocephalic and atraumatic.     Comments: Cap. Gray hair.    Right Ear: Hearing normal.     Left Ear: Hearing normal.     Mouth/Throat:     Mouth: Mucous membranes are moist. No oral lesions.     Comments: White coating in mouth, patient just took a Tums Eyes:     General: No scleral icterus.    Extraocular Movements: Extraocular movements intact.     Conjunctiva/sclera: Conjunctivae normal.     Pupils: Pupils are equal, round, and reactive to light.  Cardiovascular:     Rate and Rhythm: Normal rate and regular rhythm.     Heart sounds: Normal heart sounds. No murmur heard. No friction rub. No gallop.   Pulmonary:     Effort: Pulmonary effort is normal.     Breath sounds: Normal breath sounds. No wheezing, rhonchi or rales.  Chest:  Breasts:     Right: No axillary adenopathy or supraclavicular adenopathy.     Left: No axillary adenopathy or supraclavicular adenopathy.    Abdominal:     General: Bowel sounds are normal. There is no distension.     Palpations: Abdomen is soft. There is no hepatomegaly, splenomegaly or mass.     Tenderness: There is no abdominal tenderness. There is no guarding or rebound.  Musculoskeletal:        General: No tenderness. Normal range of motion.     Cervical back: Normal range  of motion and neck supple.  Lymphadenopathy:     Head:     Right  side of head: No preauricular, posterior auricular or occipital adenopathy.     Left side of head: No preauricular, posterior auricular or occipital adenopathy.     Cervical: No cervical adenopathy.     Upper Body:     Right upper body: No supraclavicular or axillary adenopathy.     Left upper body: No supraclavicular or axillary adenopathy.     Lower Body: No right inguinal adenopathy. No left inguinal adenopathy.  Skin:    General: Skin is warm and dry.     Findings: No bruising, erythema, lesion or rash.  Neurological:     Mental Status: He is alert and oriented to person, place, and time.  Psychiatric:        Behavior: Behavior normal.        Thought Content: Thought content normal.        Judgment: Judgment normal.    No visits with results within 3 Day(s) from this visit.  Latest known visit with results is:  Infusion on 12/02/2020  Component Date Value Ref Range Status  . Sodium 12/02/2020 134* 135 - 145 mmol/L Final  . Potassium 12/02/2020 4.4  3.5 - 5.1 mmol/L Final  . Chloride 12/02/2020 98  98 - 111 mmol/L Final  . CO2 12/02/2020 27  22 - 32 mmol/L Final  . Glucose, Bld 12/02/2020 204* 70 - 99 mg/dL Final   Glucose reference range applies only to samples taken after fasting for at least 8 hours.  . BUN 12/02/2020 22  8 - 23 mg/dL Final  . Creatinine, Ser 12/02/2020 0.59* 0.61 - 1.24 mg/dL Final  . Calcium 12/02/2020 8.8* 8.9 - 10.3 mg/dL Final  . GFR, Estimated 12/02/2020 >60  >60 mL/min Final   Comment: (NOTE) Calculated using the CKD-EPI Creatinine Equation (2021)   . Anion gap 12/02/2020 9  5 - 15 Final   Performed at [facility], [address], [place], [place] [address]   Assessment:  Kenneth Franklin is a 75 y.o. 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  Pathology revealed keratinizing squamous cell carcinoma, moderately differentiated.   Head MRI on 08/13/2020 revealed a 5.9 cm nasopharyngeal mass consistent with  known nasopharyngeal carcinoma with involvement of the clivus and sphenoid sinus. There was moderate chronic small vessel ischemic disease and cerebral atrophy.  There was no intracranial extension.  PET scan on 08/18/2020 revealed a 5.7 x 4.2 cm destructive hypermetabolic mass (SUV [id number]) involving the sphenoid sinus, skull base and posterior nasopharynx.  This was consistent with patient's known squamous cell carcinoma. There was no locoregional lymphadenopathy or distant metastatic disease.  He received IMRT from 09/08/2020 - 10/21/2020.  He received 5 weeks of cisplatin (09/08/2020 - 09/29/2020; 10/13/2020 - 10/20/2020).  He did not receive treatment on 10/06/2020 secondary to poor urine output.  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  He was admitted to [facility] from 10/25/2020 - 11/01/2020 with mucositis.  He was treated with Magic mouthwash, viscous lidocaine, IV morphine and Toradol.  Symptomatically, ***  Plan: 1.   Review labs***   2.   Clinical [id number] nasopharyngeal carcinoma             Tumor was unresectable.             PET scan  revealed no evidence of distant disease             Treatment plan: upfront radiation and chemotherapy followed by adjuvant chemotherapy                         Cisplatin 40 mg/m2 weekly x 6-7 with radiation followed by cisplatin 80 mg/m2 D1 + 5FU 1000 mg/m2/day CI D1-4 q 28 days x 3 cycles             Baseline audiogram confirmed hearing loss associated with prior exposure.             He received 5 weeks of cisplatin with radiation (completed 10/21/2020).                         Decision made by radiation oncology to truncate radiation by 3 fractions secondary to mucositis.             Symptomatically, he is slowly recovering from concurrent cisplatin and radiation.                         He is fatigued.  Appetite is poor as food has no  taste.                         Discussed ongoing supportive care prior to proceeding with planned chemotherapy.             Multiple questions asked and answered. 3.   Mucositis             Exam reveals resolution of mucositis involving the hard palate.             He no longer requires oral pain medications. 4.   Dehydration            IVF today.            Check orthostatics pre and post IV fluids today.            Patient interested in continuation of fluids throughout the week. 5.   Constipation             Discuss management of constipation.             He is using suppositories, MiraLax, Ex-lax, and stool softeners            Discuss Miralax daily.  If diarrhea- decrease frequency.            Discuss food choices, "p" fruits, hydration, activity, and magnesium (MOM or magnesium citrate). 6.   Weight loss            Patient has lost 18 pounds since 11/12/2020.            Patient notes foods have no taste.            Discuss added spices for flavor and food choices.            Follow-up with nutrition on 12/07/2020.  IVF 1 liter over 2 hours. Patient would like to try no IVF next week. Please schedule follow-up appt next week with Dr Elenore Rota for endoscopy of upper airway and audiogram. RTC on 01/17 for MD assess, review of endscopy, and audiogram, and +/- IVF.  I discussed the assessment and treatment plan with the patient.  The patient was provided an opportunity to ask questions and all were answered.  The patient agreed with the plan and demonstrated an understanding of the instructions.  The patient was advised to call back if the symptoms worsen or if the condition fails to improve as anticipated.  I provided *** minutes of face-to-face time during this this encounter and > 50% was spent counseling as documented under my assessment and plan.   Melissa C. Mike Gip, MD, PhD    12/15/2020, 10:35 AM  I, Mirian Mo Tufford, am acting as Education administrator for [employer]. Mike Gip, MD, PhD.  I,  Melissa C. Mike Gip, MD, have reviewed the above documentation for accuracy and completeness, and I agree with the above.

## 2020-12-16 ENCOUNTER — Inpatient Hospital Stay: Payer: Medicare Other

## 2020-12-16 ENCOUNTER — Inpatient Hospital Stay: Payer: Medicare Other | Admitting: Hematology and Oncology

## 2020-12-22 NOTE — Progress Notes (Signed)
[facility]  [address], [address] [place], [place] [address] Phone: [phone]  Fax: [phone]   Clinic Day:  12/23/2020  Referring physician: Verita Lamb, NP  Chief Complaint: Kenneth Franklin is a 75 y.o. male with stage III nasopharyngeal carcinoma who is seen for review of interval endoscopy and audiogram and discussion regarding direction of therapy.  HPI: The patient was last seen in the oncology clinic on 12/04/2019. At that time, he was feeling much better.  He was eating a little bit, but could not taste.  He was drinking water and 4 Boosts/day.  He received IV fluids.  Jennet Maduro, RD called the patient on 12/07/2020. There was no answer.  The patient saw Dr. Kathyrn Sheriff on 12/22/2020.  Otolaryngologic exam revealed left nasal cavity septal deviation, convex, airway obstruction 75-100%. Audiologic testing showed bilateral sensorineural hearing loss sloping from mild to severe in the high frequencies. Hearing aid evaluation was recommended.  During the interim, he has been doing "fine." He still cannot taste a lot of foods. Some things that he eats cause his mouth to burn. He eats what he can and drinks 4 Boosts per day. He denies shortness of breath, chest pain, nausea, vomiting, and diarrhea. His mouth stays dry; he is interested in trying [facility].  He takes hydrocodone every once in a while. He is not taking dexamethasone. He does not use the 2% Lidocaine mouthwash anymore.  The patient is in a wheelchair today because of his left leg. He sees PT twice per week.  He has a spot on his right cheek that he believes is skin cancer.   Past Medical History:  Diagnosis Date  . Acute ischemic stroke ([place]) 2017  . Alcohol abuse   . Anxiety   . Cancer ([place])   . Depression   . Diabetes mellitus without complication ([place])   . Dyspnea    pcp knows and ordered rescue inhaler  . Hyperlipidemia   . Hypertension   . Skin cancer    Squamous Cell  Carcinoma In Situ    Past Surgical History:  Procedure Laterality Date  . CATARACT EXTRACTION Bilateral   . COLONOSCOPY    . NASOPHARYNGOSCOPY N/A 08/12/2020   Procedure: ENDOSCOPIC NASOPHARYNGOSCOPY WITH BIOPSY;  Surgeon: Margaretha Sheffield, MD;  Location: [facility] ORS;  Service: ENT;  Laterality: N/A;  . PORTA CATH INSERTION N/A 08/24/2020   Procedure: PORTA CATH INSERTION;  Surgeon: Algernon Huxley, MD;  Location: [facility];  Service: Cardiovascular;  Laterality: N/A;    Family History  Problem Relation Age of Onset  . Diabetes Brother     Social History:  reports that he quit smoking about 16 months ago. His smoking use included cigars and cigarettes. He has a 40.00 pack-year smoking history. He has never used smokeless tobacco. He reports previous alcohol use. He reports that he does not use drugs. He smoked 1 pack/day x 40 years.  He denies exposure of radiation or toxins; he did work with asphalt. He lives alone in [place]. He is widowed. Ernestina Patches can be reached at [phone]. The patient is accompanied by his step daughter, Ernestina Patches, today.  Allergies:  Allergies  Allergen Reactions  . Propoxyphene     Unknown reaction    Current Medications: Current Outpatient Medications  Medication Sig Dispense Refill  . ALPRAZolam (XANAX) 0.25 MG tablet TAKE 1 TABLET EVERY DAY AS NEEDED FOR ANXIETY (Patient taking differently: Take 0.25 mg by mouth daily as needed  for anxiety.) 30 tablet 2  . aspirin EC 81 MG tablet Take 81 mg by mouth daily. Swallow whole.    Marland Kitchen atorvastatin (LIPITOR) 80 MG tablet TAKE 1 TABLET (80 MG TOTAL) BY MOUTH NIGHTLY. (Patient taking differently: Take 80 mg by mouth every evening.) 90 tablet 0  . busPIRone (BUSPAR) 10 MG tablet TAKE 1 TABLET (10 MG TOTAL) BY MOUTH 2 (TWO) TIMES DAILY. (Patient taking differently: Take 10 mg by mouth at bedtime.) 180 tablet 0  . clopidogrel (PLAVIX) 75 MG tablet TAKE 1 TABLET (75 MG TOTAL) BY MOUTH DAILY. 90 tablet 0  .  DENTA 5000 PLUS 1.1 % CREA dental cream Place 1 application onto teeth at bedtime.     Marland Kitchen dexamethasone (DECADRON) 2 MG tablet Take 1 tablet (2 mg total) by mouth daily. 5 tablet 0  . dexamethasone (DECADRON) 4 MG tablet Take 2 tablets (8 mg total) by mouth daily. Take daily x 3 days starting the day after cisplatin chemotherapy. Take with food. 30 tablet 1  . escitalopram (LEXAPRO) 20 MG tablet Take 20 mg by mouth every morning.     . feeding supplement (ENSURE ENLIVE / ENSURE PLUS) LIQD Take 237 mLs by mouth 3 (three) times daily between meals. 237 mL 12  . fluticasone (FLOVENT HFA) 110 MCG/ACT inhaler Inhale 2 puffs into the lungs daily as needed (shortness of breath).    Marland Kitchen glipiZIDE (GLUCOTROL) 5 MG tablet Take 5 mg by mouth daily before breakfast.     . HYDROcodone-acetaminophen (NORCO) 10-325 MG tablet Take 0.5 tablets by mouth every 4 (four) hours as needed for severe pain. 30 tablet 0  . lidocaine (XYLOCAINE) 2 % solution USE AS DIRECTED 5 MLS IN THE MOUTH OR THROAT EVERY 6 (SIX) HOURS AS NEEDED FOR MOUTH PAIN. 100 mL 0  . lidocaine-prilocaine (EMLA) cream Apply 1 application topically as needed. Apply small amount to port site at least 1 hour prior to it being accessed, cover with plastic wrap 30 g 1  . losartan (COZAAR) 100 MG tablet TAKE 1 TABLET (100 MG TOTAL) BY MOUTH DAILY. (Patient taking differently: Take 100 mg by mouth every morning.) 90 tablet 0  . magic mouthwash w/lidocaine SOLN Take 5 mLs by mouth 4 (four) times daily as needed for mouth pain. Sig: Swish/Spit 5-10 ml four times a day as needed. Dispense 480 ml. 1RF 480 mL 1  . magnesium oxide (MAG-OX) 400 MG tablet TAKE 1 TABLET BY MOUTH EVERY DAY 90 tablet 1  . metFORMIN (GLUCOPHAGE) 500 MG tablet Take 1 tablet (500 mg total) by mouth daily with breakfast. 90 tablet 0  . mirtazapine (REMERON) 30 MG tablet Take 1 tablet (30 mg total) by mouth at bedtime. 30 tablet 0  . nystatin cream (MYCOSTATIN) Apply 1 application topically 2  (two) times daily as needed (yeast).     . ondansetron (ZOFRAN) 8 MG tablet Take 1 tablet (8 mg total) by mouth 2 (two) times daily as needed. Start on the third day after cisplatin chemotherapy. 30 tablet 1  . pantoprazole (PROTONIX) 20 MG tablet Take 20 mg by mouth daily before lunch.     . phenol (CHLORASEPTIC) 1.4 % LIQD Use as directed 1 spray in the mouth or throat as needed for throat irritation / pain.  0  . polyvinyl alcohol (LIQUIFILM TEARS) 1.4 % ophthalmic solution Place 1 drop into both eyes as needed for dry eyes. 15 mL 0  . prochlorperazine (COMPAZINE) 10 MG tablet Take 1 tablet (10 mg total)  by mouth every 6 (six) hours as needed (Nausea or vomiting). 30 tablet 1  . sodium chloride (OCEAN) 0.65 % SOLN nasal spray Place 1 spray into both nostrils as needed for congestion.  0  . sodium fluoride (LURIDE) 1.1 (0.5 F) MG/ML SOLN Take 1 drop by mouth at bedtime. One dental application at bedtime.    . sucralfate (CARAFATE) 1 g tablet Take 1 tablet (1 g total) by mouth 3 (three) times daily. Dissolve in 3-4 tbsp warm water, swish and swallow. 90 tablet 3  . VITAMIN D PO Take 1 capsule by mouth daily.    Marland Kitchen escitalopram (LEXAPRO) 10 MG tablet Take 1 tablet (10 mg total) by mouth daily. Stop your lexapro in a week 6 tablet 0  . metFORMIN (GLUCOPHAGE-XR) 500 MG 24 hr tablet Take 500 mg by mouth daily. (Patient not taking: Reported on 12/23/2020)    . sitaGLIPtin (JANUVIA) 100 MG tablet Take 100 mg by mouth every morning.  (Patient not taking: Reported on 12/23/2020)    . sucralfate (CARAFATE) 1 g tablet Take 1 tablet (1 g total) by mouth 4 (four) times daily. 120 tablet 11   No current facility-administered medications for this visit.   Facility-Administered Medications Ordered in Other Visits  Medication Dose Route Frequency Provider Last Rate Last Admin  . sodium chloride flush (NS) 0.9 % injection 10 mL  10 mL Intravenous PRN Lequita Asal, MD   10 mL at 12/03/20 1138    Review of  Systems  Constitutional: Negative for chills, diaphoresis, fever, malaise/fatigue and weight loss (stable).       Feels "fine."  HENT: Negative for congestion, ear discharge, ear pain, hearing loss, nosebleeds, sinus pain, sore throat and tinnitus.        Minimal sense of taste. Some foods cause his mouth to burn.  Eyes: Negative.  Negative for blurred vision.  Respiratory: Negative.  Negative for cough, hemoptysis, sputum production and shortness of breath.   Cardiovascular: Negative.  Negative for chest pain, palpitations and leg swelling.  Gastrointestinal: Negative for abdominal pain, blood in stool, constipation, diarrhea, heartburn, melena, nausea and vomiting.       Drinks 4 Boosts per day.  Genitourinary: Negative.  Negative for dysuria, frequency, hematuria and urgency.  Musculoskeletal: Negative.  Negative for back pain, joint pain, myalgias and neck pain.  Skin: Negative for itching and rash.       Spot on right cheek  Neurological: Negative.  Negative for dizziness, tingling, sensory change, weakness and headaches.  Endo/Heme/Allergies: Negative.  Does not bruise/bleed easily.  Psychiatric/Behavioral: Negative.  Negative for depression and memory loss. The patient is not nervous/anxious and does not have insomnia.   All other systems reviewed and are negative.  Performance status (ECOG): 1-2  Vitals Blood pressure 114/80, pulse (!) 101, temperature 97.9 F (36.6 C), temperature source Tympanic, resp. rate 16, weight 157 lb 3 oz (71.3 kg), SpO2 100 %.   Physical Exam Vitals and nursing note reviewed.  Constitutional:      General: He is not in acute distress.    Appearance: Normal appearance. He is well-developed.     Interventions: Face mask in place.     Comments: Gentleman sitting in wheelchair in no acute distress. Cane by his side. Able to get onto exam table without assistance.  HENT:     Head: Normocephalic and atraumatic.     Comments: Cap. Gray hair.    Right  Ear: Hearing normal.     Left Ear:  Hearing normal.     Mouth/Throat:     Mouth: Mucous membranes are dry. No oral lesions.  Eyes:     General: No scleral icterus.    Extraocular Movements: Extraocular movements intact.     Conjunctiva/sclera: Conjunctivae normal.     Pupils: Pupils are equal, round, and reactive to light.  Cardiovascular:     Rate and Rhythm: Normal rate and regular rhythm.     Heart sounds: Normal heart sounds. No murmur heard. No friction rub. No gallop.   Pulmonary:     Effort: Pulmonary effort is normal.     Breath sounds: Normal breath sounds. No wheezing, rhonchi or rales.  Chest:  Breasts:     Right: No axillary adenopathy or supraclavicular adenopathy.     Left: No axillary adenopathy or supraclavicular adenopathy.    Abdominal:     General: Bowel sounds are normal. There is no distension.     Palpations: Abdomen is soft. There is no hepatomegaly, splenomegaly or mass.     Tenderness: There is no abdominal tenderness. There is no guarding or rebound.  Musculoskeletal:        General: No tenderness. Normal range of motion.     Cervical back: Normal range of motion and neck supple.  Lymphadenopathy:     Head:     Right side of head: No preauricular, posterior auricular or occipital adenopathy.     Left side of head: No preauricular, posterior auricular or occipital adenopathy.     Cervical: No cervical adenopathy.     Upper Body:     Right upper body: No supraclavicular or axillary adenopathy.     Left upper body: No supraclavicular or axillary adenopathy.     Lower Body: No right inguinal adenopathy. No left inguinal adenopathy.  Skin:    General: Skin is warm and dry.     Findings: No bruising, erythema, lesion or rash.  Neurological:     Mental Status: He is alert and oriented to person, place, and time.  Psychiatric:        Behavior: Behavior normal.        Thought Content: Thought content normal.        Judgment: Judgment normal.    No  visits with results within 3 Day(s) from this visit.  Latest known visit with results is:  Infusion on 12/02/2020  Component Date Value Ref Range Status  . Sodium 12/02/2020 134* 135 - 145 mmol/L Final  . Potassium 12/02/2020 4.4  3.5 - 5.1 mmol/L Final  . Chloride 12/02/2020 98  98 - 111 mmol/L Final  . CO2 12/02/2020 27  22 - 32 mmol/L Final  . Glucose, Bld 12/02/2020 204* 70 - 99 mg/dL Final   Glucose reference range applies only to samples taken after fasting for at least 8 hours.  . BUN 12/02/2020 22  8 - 23 mg/dL Final  . Creatinine, Ser 12/02/2020 0.59* 0.61 - 1.24 mg/dL Final  . Calcium 12/02/2020 8.8* 8.9 - 10.3 mg/dL Final  . GFR, Estimated 12/02/2020 >60  >60 mL/min Final   Comment: (NOTE) Calculated using the CKD-EPI Creatinine Equation (2021)   . Anion gap 12/02/2020 9  5 - 15 Final   Performed at [facility], [address], [place], [place] [address]   Assessment:  Kenneth Franklin is a 75 y.o. 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  Pathology revealed keratinizing squamous cell carcinoma, moderately differentiated.   Head MRI on 08/13/2020 revealed  a 5.9 cm nasopharyngeal mass consistent with known nasopharyngeal carcinoma with involvement of the clivus and sphenoid sinus. There was moderate chronic small vessel ischemic disease and cerebral atrophy.  There was no intracranial extension.  PET scan on 08/18/2020 revealed a 5.7 x 4.2 cm destructive hypermetabolic mass (SUV [id number]) involving the sphenoid sinus, skull base and posterior nasopharynx.  This was consistent with patient's known squamous cell carcinoma. There was no locoregional lymphadenopathy or distant metastatic disease.  He received IMRT from 09/08/2020 - 10/21/2020.  He received 5 weeks of cisplatin (09/08/2020 - 09/29/2020; 10/13/2020 - 10/20/2020).  He did not receive treatment on 10/06/2020 secondary to poor urine output.  Otolaryngologic exam on 12/22/2020  revealed left nasal cavity septal deviation, convex, airway obstruction 75-100%.    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 Audiogram on 12/22/2020 was stable. Hearing aid evaluation was recommended.  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 Hearing aid evaluation was recommended.  He was admitted to [facility] from 10/25/2020 - 11/01/2020 with mucositis.  He was treated with Magic mouthwash, viscous lidocaine, IV morphine and Toradol.  Symptomatically, he feels "fine." He still cannot taste a lot of foods. Some things that he eats cause his mouth to burn. He eats what he can and drinks 4 Boosts per day. He has xerostomia.  Plan: 1.   Clinical [id number] nasopharyngeal carcinoma             Tumor was unresectable.             PET scan revealed no evidence of distant disease             Treatment plan: upfront radiation and chemotherapy followed by adjuvant chemotherapy                         Cisplatin 40 mg/m2 weekly x 6-7 with radiation followed by cisplatin 80 mg/m2 D1 + 5FU 1000 mg/m2/day CI D1-4 q 28 days x 3 cycles             Baseline audiogram confirmed hearing loss associated with prior exposure.             He received 5 weeks of cisplatin with radiation (completed 10/21/2020).                         Decision made by radiation oncology to truncate radiation by 3 fractions secondary to mucositis.             Endoscopy on 12/22/2020 revealed left nasal cavity septal deviation, convex, airway obstruction 75-100%.  .  Discuss difficulty to treatment to date.  Discuss original plan for adjuvant treatment after concurrent chemotherapy and radiation.  NCCN guidelines are to consider induction or adjuvant chemotherapy if high risk features present (bulky tumor volume, high serum EBV DNA copy  number).   Review of imaging with radiology confirms bulky disease.  Discuss presentation at tumor board.  Anticipate follow-up PET scan (minimum 12 weeks s/p XRT) 2.   High frequency hearing loss             Audiogram on 12/22/2020 revealed bilateral sensorineural hearing loss sloping from mild to severe in the high frequencies.   Hearing aid evaluation was recommended. 3.   Xerostomia  Patient notes dry mouth s/p radiation.  Consider SalivaMax. 4.   Weight loss            Patient has lost 18 pounds since 11/12/2020.            Weight has been stable since 12/03/2020.  Continue to encourage food choice and ongoing supplements (Boost). 5.   Tumor board on 12/23/2020. 6.   MD to call daughter after tumor board tomorrow.  I discussed the assessment and treatment plan with the patient.  The patient was provided an opportunity to ask questions and all were answered.  The patient agreed with the plan and demonstrated an understanding of the instructions.  The patient was advised to call back if the symptoms worsen or if the condition fails to improve as anticipated.  I provided 18 minutes of face-to-face time during this this encounter and > 50% was spent counseling as documented under my assessment and plan.  An additional 15-20 minutes were spent reviewing his chart (Epic and Care Everywhere) including notes, labs, and imaging studies and writing chemotherapy and reviewing with pharmacy.    Mikahla Wisor C. Mike Gip, MD, PhD    12/23/2020, 2:15 PM  I, Mirian Mo Tufford, am acting as Education administrator for [employer]. Mike Gip, MD, PhD.  I, Delores Thelen C. Mike Gip, MD, have reviewed the above documentation for accuracy and completeness, and I agree with the above.

## 2020-12-23 ENCOUNTER — Other Ambulatory Visit: Payer: Self-pay

## 2020-12-23 ENCOUNTER — Inpatient Hospital Stay (HOSPITAL_BASED_OUTPATIENT_CLINIC_OR_DEPARTMENT_OTHER): Payer: Medicare Other | Admitting: Hematology and Oncology

## 2020-12-23 ENCOUNTER — Other Ambulatory Visit: Payer: Medicare Other

## 2020-12-23 ENCOUNTER — Inpatient Hospital Stay: Payer: Medicare Other

## 2020-12-23 VITALS — BP 114/80 | HR 101 | Temp 97.9°F | Resp 16 | Wt 157.2 lb

## 2020-12-23 DIAGNOSIS — H9190 Unspecified hearing loss, unspecified ear: Secondary | ICD-10-CM

## 2020-12-23 DIAGNOSIS — K117 Disturbances of salivary secretion: Secondary | ICD-10-CM | POA: Diagnosis not present

## 2020-12-23 DIAGNOSIS — C119 Malignant neoplasm of nasopharynx, unspecified: Secondary | ICD-10-CM

## 2020-12-23 DIAGNOSIS — Z8522 Personal history of malignant neoplasm of nasal cavities, middle ear, and accessory sinuses: Secondary | ICD-10-CM | POA: Diagnosis not present

## 2020-12-23 MED ORDER — HEPARIN SOD (PORK) LOCK FLUSH 100 UNIT/ML IV SOLN
INTRAVENOUS | Status: AC
  Filled 2020-12-23: qty 5

## 2020-12-24 ENCOUNTER — Other Ambulatory Visit: Payer: Medicare Other

## 2020-12-24 NOTE — Progress Notes (Signed)
Tumor Board Documentation  Kenneth Franklin was presented by Dr Mike Gip at our Tumor Board on 12/24/2020, which included representatives from medical oncology,radiation oncology,surgical,radiology,pathology,navigation,internal medicine,pharmacy,genetics,palliative care,pulmonology.  Kenneth Franklin currently presents as a current patient,for discussion with history of the following treatments: neoadjuvant chemoradiation,active survellience.  Additionally, we reviewed previous medical and familial history, history of present illness, and recent lab results along with all available histopathologic and imaging studies. The tumor board considered available treatment options and made the following recommendations: Adjuvant chemotherapy,Additional screening (PET Scan)    The following procedures/referrals were also placed: No orders of the defined types were placed in this encounter.   Clinical Trial Status: not discussed   Staging used: AJCC Stage Group  AJCC Staging: T: 3 N: 0   Group: Stage III Nasopharyngeal Carcinoma   National site-specific guidelines NCCN were discussed with respect to the case.  Tumor board is a meeting of clinicians from various specialty areas who evaluate and discuss patients for whom a multidisciplinary approach is being considered. Final determinations in the plan of care are those of the provider(s). The responsibility for follow up of recommendations given during tumor board is that of the provider.   Today's extended care, comprehensive team conference, Kenneth Franklin was not present for the discussion and was not examined.   Multidisciplinary Tumor Board is a multidisciplinary case peer review process.  Decisions discussed in the Multidisciplinary Tumor Board reflect the opinions of the specialists present at the conference without having examined the patient.  Ultimately, treatment and diagnostic decisions rest with the primary provider(s) and the patient.

## 2020-12-28 NOTE — Progress Notes (Signed)
ON PATHWAY REGIMEN - Head and Neck  No Change  Continue With Treatment as Ordered.  Original Decision Date/Time: 08/20/2020 13:00   Cisplatin 40 mg/m2 IV D1 q7 Days + RT:   A cycle is every 7 days:     Cisplatin   **Always confirm dose/schedule in your pharmacy ordering system**  Cisplatin 80 mg/m2 IV D1 + Fluorouracil 1,000 mg/m2/day CIV D1,2,3,4 q28 Days:   A cycle is every 28 days:     Cisplatin      Fluorouracil   **Always confirm dose/schedule in your pharmacy ordering system**  Patient Characteristics: Nasopharyngeal, Stage II - IVA Disease Classification: Nasopharyngeal Current Disease Status: No Distant Metastases and No Recurrent Disease AJCC T Category: T3 AJCC N Category: N0 AJCC M Category: M0 AJCC 8 Stage Grouping: III Intent of Therapy: Curative Intent, Discussed with Patient

## 2021-01-03 ENCOUNTER — Telehealth: Payer: Self-pay | Admitting: Hematology and Oncology

## 2021-01-03 NOTE — Telephone Encounter (Signed)
Re: Tumor board discussions  I spoke with the patient's daughter the day after tumor board on 12/25/2020.  Given the patient's of bulky tumor, recommendations were for consideration of adjuvant chemotherapy consisting of cisplatin and 5-fluorouracil.  Patient's daughter stated she would discuss this with her father and follow-up in clinic with his decision.   Lequita Asal, MD

## 2021-01-06 ENCOUNTER — Telehealth: Payer: Medicare Other | Admitting: Hematology and Oncology

## 2021-01-07 NOTE — Progress Notes (Signed)
[facility]  [address], [address] [place],  [address] Phone: [phone]  Fax: [phone]   Telephone Office Visit:  01/08/2021  Referring physician: Verita Lamb, NP  I connected with Kenneth Franklin on 01/08/2021 at 9:36 AM by telephone and verified that I was speaking with the correct person using 2 identifiers.  The patient was at home.  I discussed the limitations, risk, security and privacy concerns of performing an evaluation and management service by telephone and the availability of in person appointments.  I also discussed with the patient that there may be a patient responsible charge related to this service.  The patient expressed understanding and agreed to proceed.   Chief Complaint: Kenneth Franklin is a 75 y.o. male with stage III nasopharyngeal carcinoma who is seen for discussion regarding direction of therapy.  HPI: The patient was last seen in the oncology clinic on 12/23/2020. At that time, he felt "fine." He still could not taste a lot of foods. Some things that he ate caused his mouth to burn. He ate what he could and drank 4 Boosts per day. He had xerostomia.  The patient was presented at tumor board on 12/24/2020.  His daughter was contacted on 12/25/2020.  Given the patient's bulky tumor, recommendations were for consideration of adjuvant chemotherapy consisting of cisplatin and 5-fluorouracil.  Follow-up PET scan at end of therapy was also recommended.   During the interim, he has felt "alright".  He continues to have a dry mouth.  He used the samples of SalivaMax; they worked pretty well.  He states with SalivaMax, his mouth was not completely dry.  His tongue still burns a little.  His taste is coming back gradually.  He is drinking 4 Boosts per day.  He has not lost any weight.  He states that he is considering whether he wants to do any more chemotherapy.  At this point, he is leaning against it.  He will talk to his daughter  this week and contact the clinic regarding his decision.   Past Medical History:  Diagnosis Date  . Acute ischemic stroke ([place]) 2017  . Alcohol abuse   . Anxiety   . Cancer ([place])   . Depression   . Diabetes mellitus without complication ([place])   . Dyspnea    pcp knows and ordered rescue inhaler  . Hyperlipidemia   . Hypertension   . Skin cancer    Squamous Cell Carcinoma In Situ    Past Surgical History:  Procedure Laterality Date  . CATARACT EXTRACTION Bilateral   . COLONOSCOPY    . NASOPHARYNGOSCOPY N/A 08/12/2020   Procedure: ENDOSCOPIC NASOPHARYNGOSCOPY WITH BIOPSY;  Surgeon: Margaretha Sheffield, MD;  Location: [facility] ORS;  Service: ENT;  Laterality: N/A;  . PORTA CATH INSERTION N/A 08/24/2020   Procedure: PORTA CATH INSERTION;  Surgeon: Algernon Huxley, MD;  Location: [facility];  Service: Cardiovascular;  Laterality: N/A;    Family History  Problem Relation Age of Onset  . Diabetes Brother     Social History:  reports that he quit smoking about 16 months ago. His smoking use included cigars and cigarettes. He has a 40.00 pack-year smoking history. He has never used smokeless tobacco. He reports previous alcohol use. He reports that he does not use drugs. He smoked 1 pack/day x 40 years.  He denies exposure of radiation or toxins; he did work with asphalt. He lives alone in [place]. He is widowed. Ernestina Patches can  be reached at [phone]. The patient is alone today.  Participants in the patient's visit and their role in the encounter included the patient, and Baxter Flattery Day, CMA, today.  The intake visit was provided by Baxter Flattery Day, CMA.  Allergies:  Allergies  Allergen Reactions  . Propoxyphene     Unknown reaction    Current Medications: Current Outpatient Medications  Medication Sig Dispense Refill  . ALPRAZolam (XANAX) 0.25 MG tablet TAKE 1 TABLET EVERY DAY AS NEEDED FOR ANXIETY (Patient taking differently: Take 0.25 mg by mouth daily as needed for anxiety.) 30  tablet 2  . aspirin EC 81 MG tablet Take 81 mg by mouth daily. Swallow whole.    Marland Kitchen atorvastatin (LIPITOR) 80 MG tablet TAKE 1 TABLET (80 MG TOTAL) BY MOUTH NIGHTLY. (Patient taking differently: Take 80 mg by mouth every evening.) 90 tablet 0  . busPIRone (BUSPAR) 10 MG tablet TAKE 1 TABLET (10 MG TOTAL) BY MOUTH 2 (TWO) TIMES DAILY. (Patient taking differently: Take 10 mg by mouth at bedtime.) 180 tablet 0  . clopidogrel (PLAVIX) 75 MG tablet TAKE 1 TABLET (75 MG TOTAL) BY MOUTH DAILY. 90 tablet 0  . DENTA 5000 PLUS 1.1 % CREA dental cream Place 1 application onto teeth at bedtime.     Marland Kitchen dexamethasone (DECADRON) 2 MG tablet Take 1 tablet (2 mg total) by mouth daily. 5 tablet 0  . escitalopram (LEXAPRO) 10 MG tablet Take 1 tablet (10 mg total) by mouth daily. Stop your lexapro in a week 6 tablet 0  . escitalopram (LEXAPRO) 20 MG tablet Take 20 mg by mouth every morning.     . feeding supplement (ENSURE ENLIVE / ENSURE PLUS) LIQD Take 237 mLs by mouth 3 (three) times daily between meals. 237 mL 12  . fluticasone (FLOVENT HFA) 110 MCG/ACT inhaler Inhale 2 puffs into the lungs daily as needed (shortness of breath).    Marland Kitchen glipiZIDE (GLUCOTROL) 5 MG tablet Take 5 mg by mouth daily before breakfast.     . HYDROcodone-acetaminophen (NORCO) 10-325 MG tablet Take 0.5 tablets by mouth every 4 (four) hours as needed for severe pain. 30 tablet 0  . lidocaine (XYLOCAINE) 2 % solution USE AS DIRECTED 5 MLS IN THE MOUTH OR THROAT EVERY 6 (SIX) HOURS AS NEEDED FOR MOUTH PAIN. 100 mL 0  . lidocaine-prilocaine (EMLA) cream Apply 1 application topically as needed. Apply small amount to port site at least 1 hour prior to it being accessed, cover with plastic wrap 30 g 1  . losartan (COZAAR) 100 MG tablet TAKE 1 TABLET (100 MG TOTAL) BY MOUTH DAILY. (Patient taking differently: Take 100 mg by mouth every morning.) 90 tablet 0  . magic mouthwash w/lidocaine SOLN Take 5 mLs by mouth 4 (four) times daily as needed for mouth  pain. Sig: Swish/Spit 5-10 ml four times a day as needed. Dispense 480 ml. 1RF 480 mL 1  . magnesium oxide (MAG-OX) 400 MG tablet TAKE 1 TABLET BY MOUTH EVERY DAY 90 tablet 1  . metFORMIN (GLUCOPHAGE) 500 MG tablet Take 1 tablet (500 mg total) by mouth daily with breakfast. 90 tablet 0  . metFORMIN (GLUCOPHAGE-XR) 500 MG 24 hr tablet Take 500 mg by mouth daily. (Patient not taking: Reported on 12/23/2020)    . mirtazapine (REMERON) 30 MG tablet Take 1 tablet (30 mg total) by mouth at bedtime. 30 tablet 0  . nystatin cream (MYCOSTATIN) Apply 1 application topically 2 (two) times daily as needed (yeast).     Marland Kitchen  pantoprazole (PROTONIX) 20 MG tablet Take 20 mg by mouth daily before lunch.     . phenol (CHLORASEPTIC) 1.4 % LIQD Use as directed 1 spray in the mouth or throat as needed for throat irritation / pain.  0  . polyvinyl alcohol (LIQUIFILM TEARS) 1.4 % ophthalmic solution Place 1 drop into both eyes as needed for dry eyes. 15 mL 0  . sitaGLIPtin (JANUVIA) 100 MG tablet Take 100 mg by mouth every morning.  (Patient not taking: Reported on 12/23/2020)    . sodium chloride (OCEAN) 0.65 % SOLN nasal spray Place 1 spray into both nostrils as needed for congestion.  0  . sodium fluoride (LURIDE) 1.1 (0.5 F) MG/ML SOLN Take 1 drop by mouth at bedtime. One dental application at bedtime.    . sucralfate (CARAFATE) 1 g tablet Take 1 tablet (1 g total) by mouth 4 (four) times daily. 120 tablet 11  . sucralfate (CARAFATE) 1 g tablet Take 1 tablet (1 g total) by mouth 3 (three) times daily. Dissolve in 3-4 tbsp warm water, swish and swallow. 90 tablet 3  . VITAMIN D PO Take 1 capsule by mouth daily.     No current facility-administered medications for this visit.   Facility-Administered Medications Ordered in Other Visits  Medication Dose Route Frequency Provider Last Rate Last Admin  . sodium chloride flush (NS) 0.9 % injection 10 mL  10 mL Intravenous PRN Lequita Asal, MD   10 mL at 12/03/20 1138     Review of Systems  Constitutional: Negative for chills, diaphoresis, fever, malaise/fatigue and weight loss.       Feels "alright."  HENT: Negative for congestion, ear discharge, ear pain, hearing loss, nosebleeds, sinus pain, sore throat and tinnitus.        Xerostomia.  Taste is coming back. Tongue burns a little.   Eyes: Negative for blurred vision.  Respiratory: Negative for cough, hemoptysis, sputum production and shortness of breath.   Cardiovascular: Negative for chest pain, palpitations and leg swelling.  Gastrointestinal: Negative for abdominal pain, blood in stool, constipation, diarrhea, heartburn, melena, nausea and vomiting.       Drinks 4 Boosts per day  Genitourinary: Negative for dysuria, frequency, hematuria and urgency.  Musculoskeletal: Negative for back pain, joint pain, myalgias and neck pain.  Skin: Negative for itching and rash.  Neurological: Negative for dizziness, tingling, sensory change, weakness and headaches.  Endo/Heme/Allergies: Does not bruise/bleed easily.  Psychiatric/Behavioral: Negative for depression and memory loss. The patient is not nervous/anxious and does not have insomnia.   All other systems reviewed and are negative.  Performance status (ECOG): 1-2   No visits with results within 3 Day(s) from this visit.  Latest known visit with results is:  Infusion on 12/02/2020  Component Date Value Ref Range Status  . Sodium 12/02/2020 134* 135 - 145 mmol/L Final  . Potassium 12/02/2020 4.4  3.5 - 5.1 mmol/L Final  . Chloride 12/02/2020 98  98 - 111 mmol/L Final  . CO2 12/02/2020 27  22 - 32 mmol/L Final  . Glucose, Bld 12/02/2020 204* 70 - 99 mg/dL Final   Glucose reference range applies only to samples taken after fasting for at least 8 hours.  . BUN 12/02/2020 22  8 - 23 mg/dL Final  . Creatinine, Ser 12/02/2020 0.59* 0.61 - 1.24 mg/dL Final  . Calcium 12/02/2020 8.8* 8.9 - 10.3 mg/dL Final  . GFR, Estimated 12/02/2020 >60  >60 mL/min Final    Comment: (NOTE) Calculated using  the CKD-EPI Creatinine Equation (2021)   . Anion gap 12/02/2020 9  5 - 15 Final   Performed at [facility], [address] [place], [place] [address]    Assessment:  Kenneth Franklin is a 75 y.o. 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  Pathology revealed keratinizing squamous cell carcinoma, moderately differentiated.   Head MRI on 08/13/2020 revealed a 5.9 cm nasopharyngeal mass consistent with known nasopharyngeal carcinoma with involvement of the clivus and sphenoid sinus. There was moderate chronic small vessel ischemic disease and cerebral atrophy.  There was no intracranial extension.  PET scan on 08/18/2020 revealed a 5.7 x 4.2 cm destructive hypermetabolic mass (SUV 69.48) involving the sphenoid sinus, skull base and posterior nasopharynx.  This was consistent with patient's known squamous cell carcinoma. There was no locoregional lymphadenopathy or distant metastatic disease.  He received IMRT from 09/08/2020 - 10/21/2020.  He received 5 weeks of cisplatin (09/08/2020 - 09/29/2020; 10/13/2020 - 10/20/2020).  He did not receive treatment on 10/06/2020 secondary to poor urine output.  Otolaryngologic exam on 12/22/2020 revealed left nasal cavity septal deviation, convex, airway obstruction 75-100%.    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 Audiogram on 12/22/2020 was stable. Hearing aid evaluation was recommended.  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 Hearing aid evaluation was recommended.  He was admitted to [facility] from 10/25/2020 - 11/01/2020 with mucositis.  He was treated with Magic mouthwash, viscous lidocaine, IV morphine and Toradol.  Symptomatically, he continues to improve.  He has a dry mouth.   Taste sensation is slowly improving.  Plan: 1.   Clinical [id number] nasopharyngeal carcinoma             Tumor was unresectable.             PET scan revealed no evidence of distant disease             Treatment plan: upfront radiation and chemotherapy followed by adjuvant chemotherapy                         Cisplatin 40 mg/m2 weekly x 6-7 with radiation followed by cisplatin 80 mg/m2 D1 + 5FU 1000 mg/m2/day CI D1-4 q 28 days x 3 cycles             Baseline audiogram confirmed hearing loss associated with prior exposure.             He received 5 weeks of cisplatin with radiation (completed 10/21/2020).                         Decision made by radiation oncology to truncate radiation by 3 fractions secondary to mucositis.             Endoscopy on 12/22/2020 revealed left nasal cavity septal deviation, convex, airway obstruction 75-100%.  .  Review original plan original plan for adjuvant chemotherapy after concurrent chemotherapy and radiation.  NCCN guidelines reviewed.   Consider adjuvant chemotherapy if high risk features present (bulky tumor volume, high serum EBV DNA copy number).  Patient presented at tumor board on 12/24/2020.   Recommendation was to consider 3 cycles of adjuvant chemotherapy.  Patient currently leaning against treatment.  He will contact our office next week.  Discuss plans  for follow-up imaging at least 3 months s/p XRT. 2.   High frequency hearing loss             Audiogram on 12/22/2020 revealed bilateral sensorineural hearing loss sloping from mild to severe in the high frequencies.   Hearing aide evaluation was recommended.  Symptomatically, he denies any change in hearing. 3.   Xerostomia  He has a dry mouth s/p radiation.  He notes benefit with the trial of SalivaMax.  Assist with additional SalivaMax Rx. 4.   Weight loss            Patient notes weight is stable.            He is drinking 4 boosts/day. 5.   Contact patient's daughter next week- consider  EBV DNA testing. 6.   RTC based on above.  I discussed the assessment and treatment plan with the patient.  The patient was provided an opportunity to ask questions and all were answered.  The patient agreed with the plan and demonstrated an understanding of the instructions.  The patient was advised to call back if the symptoms worsen or if the condition fails to improve as anticipated.   I provided 12 minutes (9:36 AM - 9:47 AM) of non face-to-face telephone visit time during this this encounter and > 50% was spent counseling as documented under my assessment and plan.  I provided these services from the home.   Larina Lieurance C. Mike Gip, MD, PhD    01/08/2021, 10:54 AM  I, Mirian Mo Tufford, am acting as Education administrator for [employer]. Mike Gip, MD, PhD.  I, Bertha Lokken C. Mike Gip, MD, have reviewed the above documentation for accuracy and completeness, and I agree with the above.

## 2021-01-08 ENCOUNTER — Telehealth: Payer: Medicare Other | Admitting: Hematology and Oncology

## 2021-01-08 ENCOUNTER — Inpatient Hospital Stay: Payer: Medicare Other | Attending: Hematology and Oncology | Admitting: Hematology and Oncology

## 2021-01-08 ENCOUNTER — Encounter: Payer: Self-pay | Admitting: Hematology and Oncology

## 2021-01-08 ENCOUNTER — Other Ambulatory Visit: Payer: Self-pay | Admitting: Hematology and Oncology

## 2021-01-08 DIAGNOSIS — R634 Abnormal weight loss: Secondary | ICD-10-CM

## 2021-01-08 DIAGNOSIS — C119 Malignant neoplasm of nasopharynx, unspecified: Secondary | ICD-10-CM

## 2021-01-08 DIAGNOSIS — Z7189 Other specified counseling: Secondary | ICD-10-CM | POA: Diagnosis not present

## 2021-01-08 DIAGNOSIS — H9190 Unspecified hearing loss, unspecified ear: Secondary | ICD-10-CM | POA: Diagnosis not present

## 2021-01-25 ENCOUNTER — Telehealth: Payer: Self-pay | Admitting: Hematology and Oncology

## 2021-01-25 NOTE — Telephone Encounter (Signed)
Per pt daughter it is okay to call him. He wants to speak with Dr. Mike Gip about getting additional treatments. The number attached belongs to him.

## 2021-01-27 ENCOUNTER — Other Ambulatory Visit: Payer: Self-pay | Admitting: Hematology and Oncology

## 2021-01-27 ENCOUNTER — Telehealth: Payer: Self-pay | Admitting: *Deleted

## 2021-01-27 ENCOUNTER — Telehealth: Payer: Self-pay | Admitting: Hematology and Oncology

## 2021-01-27 NOTE — Telephone Encounter (Signed)
Re:  Treatment  Patient called today regarding vision to pursue cycles of adjuvant cisplatin and 5-fluorouracil as recommended by tumor board given his bulky disease.  We discussed EBV testing as another marker of high risk disease.  He wishes to have labs drawn tomorrow.  We discussed plans for initiation of chemotherapy next week with 5-FU pump running over the weekend.   He stated his daughter would call for more information tomorrow.  Several questions asked and answered.   Lequita Asal, MD

## 2021-01-27 NOTE — Telephone Encounter (Signed)
Patient called stating that he had called Monday to speak with Dr Mike Gip regarding further treatment. He states that he has decided he wants to take the additional 3 treatment recommended by Dr Mike Gip. He currently has no follow up appointment with Medical Oncology. Please return his call [phone]

## 2021-01-28 ENCOUNTER — Telehealth: Payer: Self-pay | Admitting: Hematology and Oncology

## 2021-01-28 ENCOUNTER — Other Ambulatory Visit: Payer: Self-pay

## 2021-01-28 ENCOUNTER — Other Ambulatory Visit: Payer: Self-pay | Admitting: Hematology and Oncology

## 2021-01-28 ENCOUNTER — Inpatient Hospital Stay: Payer: Medicare Other | Attending: Hematology and Oncology

## 2021-01-28 DIAGNOSIS — Z8673 Personal history of transient ischemic attack (TIA), and cerebral infarction without residual deficits: Secondary | ICD-10-CM | POA: Diagnosis not present

## 2021-01-28 DIAGNOSIS — J342 Deviated nasal septum: Secondary | ICD-10-CM | POA: Diagnosis not present

## 2021-01-28 DIAGNOSIS — K117 Disturbances of salivary secretion: Secondary | ICD-10-CM | POA: Insufficient documentation

## 2021-01-28 DIAGNOSIS — H903 Sensorineural hearing loss, bilateral: Secondary | ICD-10-CM | POA: Insufficient documentation

## 2021-01-28 DIAGNOSIS — R682 Dry mouth, unspecified: Secondary | ICD-10-CM | POA: Diagnosis not present

## 2021-01-28 DIAGNOSIS — Z85828 Personal history of other malignant neoplasm of skin: Secondary | ICD-10-CM | POA: Diagnosis not present

## 2021-01-28 DIAGNOSIS — Z79899 Other long term (current) drug therapy: Secondary | ICD-10-CM | POA: Insufficient documentation

## 2021-01-28 DIAGNOSIS — C119 Malignant neoplasm of nasopharynx, unspecified: Secondary | ICD-10-CM | POA: Insufficient documentation

## 2021-01-28 DIAGNOSIS — Z87891 Personal history of nicotine dependence: Secondary | ICD-10-CM | POA: Diagnosis not present

## 2021-01-28 DIAGNOSIS — Z833 Family history of diabetes mellitus: Secondary | ICD-10-CM | POA: Insufficient documentation

## 2021-01-28 NOTE — Telephone Encounter (Signed)
I got him set for a pump D/C on 3/14 @ 3:00, so he should be all set!

## 2021-01-28 NOTE — Telephone Encounter (Signed)
Pt's daughter,Tina, called and requested a call back concerning a test that was mentioned to determine the likelihood of cancer returning. Pt's daughter stated that pt was confused and wanted her to call and obtain clarification. In basket sent to team.

## 2021-01-28 NOTE — Telephone Encounter (Signed)
Kenneth Franklin, it looks like he will need an appt for d/c pump on 3/14.  Do you guys go ahead and scheduled that as well?

## 2021-01-28 NOTE — Telephone Encounter (Signed)
01/28/2021 Correction; the appts are scheduled for Wednesday 3/9, I just made a typo. I called the pt's daughter and informed her of the mistake, and she confirmed appts on 3/9, and will make sure the pt is there  SRW

## 2021-01-28 NOTE — Telephone Encounter (Signed)
01/28/2021 Spoke w/ pts daughter regarding upcoming appts. EBV testing scheduled for today @ 2:30. Labs/MD/cisplatin + 5FU scheduled for 02/02/21. Daughter confirmed all appts and will be sure pt arrives SRW

## 2021-01-28 NOTE — Telephone Encounter (Signed)
  He will need to start on Wednesday 03/09 as the CI 5FU is over 5 days with a disconnect on 03/14 (Monday).  M

## 2021-01-30 LAB — EPSTEIN BARR VRS(EBV DNA BY PCR): EBV DNA QN by PCR: NEGATIVE IU/mL

## 2021-02-01 ENCOUNTER — Telehealth: Payer: Self-pay

## 2021-02-01 ENCOUNTER — Telehealth: Payer: Self-pay | Admitting: *Deleted

## 2021-02-01 NOTE — Telephone Encounter (Signed)
  Please call patient with results.  M

## 2021-02-01 NOTE — Telephone Encounter (Signed)
Patient called stating that he was to have gotten a call from Dr Mike Gip today about his lab results drawn Friday.   Ref Range & Units 4 d ago  EBV DNA QN by PCR Negative IU/mL Negative   Comment: (NOTE)  No EBV DNA detected.  The linear range of this assay is 35 - 100,000,000 IU/mL  Performed At: [facility]  [address] [place], [place] [id number]  Rush Farmer MD QL:[id number]   Resulting Agency  [facility]         Specimen Collected: 01/28/21 14:33 Last Resulted: 01/30/21 16:36

## 2021-02-01 NOTE — Telephone Encounter (Signed)
Nutrition Follow-up:  Patient with stage III nasopharyneal carcinoma.  Patient completed concurrent chemotherapy and radiation therapy.  Planning to restart chemotherapy.    Called patient and spoke with him via phone.  Patient reports that he is still not able to taste and he is not really eating solid foods.  Drinks 4 shakes per day.  Says he has ensure original right now that he is drinking.  Has not eaten any solid foods today.  "I can't eat it because I can't taste it."  Denies trouble swallowing.      Medications: reviewed  Labs: reviewed  Anthropometrics:   Weight 157 lb decreased from 164 lb on 11/29 165 lb on 11/22 170-175 lb UBW   Estimated Energy Needs  Kcals: 2100-2500 Protein: 105-125 g Fluid: 2.1 L  NUTRITION DIAGNOSIS: Inadequate oral intake continues   INTERVENTION:  Recommend patient increase ensure plus/boost plus shakes to 5 per day (350 calorie + shake).  If unable to eat anything solid foods 6-7 per day of ensure plus/boost plus would better meet nutritional needs.      MONITORING, EVALUATION, GOAL: weight trends, intake   NEXT VISIT: April 4th phone call  Kenneth Franklin B. Kenneth Franklin, [place], [place] Registered Dietitian [phone] (mobile)

## 2021-02-02 ENCOUNTER — Telehealth: Payer: Self-pay | Admitting: Hematology and Oncology

## 2021-02-02 NOTE — Progress Notes (Incomplete)
[facility]  [address] [place], [place] [address] Phone: [phone]  Fax: [phone]   Clinic Day: 02/02/21  Referring physician: Verita Lamb, NP  Chief Complaint: Kenneth Franklin is a 75 y.o. male with stage III nasopharyngeal carcinoma who is seen for assessment prior to initiation of cisplatin and 5FU pump.  HPI: The patient was last seen in the oncology clinic on 01/08/2021 via telemedicine. At that time, he continued to improve. He had a dry mouth. Taste sensation was slowly improving.   The patient spoke to Jennet Maduro, RD on 02/01/2021 by phone. He still could not taste and was not really eating solid foods. He was drinking four Ensures per day. The patient was encouraged to increase Ensure intake.  EBV was negative on 01/28/2021.  During the interim, ***   Past Medical History:  Diagnosis Date  . Acute ischemic stroke ([place]) 2017  . Alcohol abuse   . Anxiety   . Cancer ([place])   . Depression   . Diabetes mellitus without complication ([place])   . Dyspnea    pcp knows and ordered rescue inhaler  . Hyperlipidemia   . Hypertension   . Skin cancer    Squamous Cell Carcinoma In Situ    Past Surgical History:  Procedure Laterality Date  . CATARACT EXTRACTION Bilateral   . COLONOSCOPY    . NASOPHARYNGOSCOPY N/A 08/12/2020   Procedure: ENDOSCOPIC NASOPHARYNGOSCOPY WITH BIOPSY;  Surgeon: Margaretha Sheffield, MD;  Location: [facility] ORS;  Service: ENT;  Laterality: N/A;  . PORTA CATH INSERTION N/A 08/24/2020   Procedure: PORTA CATH INSERTION;  Surgeon: Algernon Huxley, MD;  Location: [facility];  Service: Cardiovascular;  Laterality: N/A;    Family History  Problem Relation Age of Onset  . Diabetes Brother     Social History:  reports that he quit smoking about 17 months ago. His smoking use included cigars and cigarettes. He has a 40.00 pack-year smoking history. He has never used smokeless tobacco. He reports previous alcohol  use. He reports that he does not use drugs. He smoked 1 pack/day x 40 years.  He denies exposure of radiation or toxins; he did work with asphalt. He lives alone in [place]. He is widowed. Ernestina Patches can be reached at [phone]. The patient is alone*** today.  Allergies:  Allergies  Allergen Reactions  . Propoxyphene     Unknown reaction    Current Medications: Current Outpatient Medications  Medication Sig Dispense Refill  . ALPRAZolam (XANAX) 0.25 MG tablet TAKE 1 TABLET EVERY DAY AS NEEDED FOR ANXIETY (Patient taking differently: Take 0.25 mg by mouth daily as needed for anxiety.) 30 tablet 2  . aspirin EC 81 MG tablet Take 81 mg by mouth daily. Swallow whole.    Marland Kitchen atorvastatin (LIPITOR) 80 MG tablet TAKE 1 TABLET (80 MG TOTAL) BY MOUTH NIGHTLY. (Patient taking differently: Take 80 mg by mouth every evening.) 90 tablet 0  . busPIRone (BUSPAR) 10 MG tablet TAKE 1 TABLET (10 MG TOTAL) BY MOUTH 2 (TWO) TIMES DAILY. (Patient taking differently: Take 10 mg by mouth at bedtime.) 180 tablet 0  . clopidogrel (PLAVIX) 75 MG tablet TAKE 1 TABLET (75 MG TOTAL) BY MOUTH DAILY. 90 tablet 0  . DENTA 5000 PLUS 1.1 % CREA dental cream Place 1 application onto teeth at bedtime.     Marland Kitchen dexamethasone (DECADRON) 2 MG tablet Take 1 tablet (2 mg total) by mouth daily. 5 tablet 0  .  escitalopram (LEXAPRO) 10 MG tablet Take 1 tablet (10 mg total) by mouth daily. Stop your lexapro in a week 6 tablet 0  . escitalopram (LEXAPRO) 20 MG tablet Take 20 mg by mouth every morning.     . feeding supplement (ENSURE ENLIVE / ENSURE PLUS) LIQD Take 237 mLs by mouth 3 (three) times daily between meals. 237 mL 12  . fluticasone (FLOVENT HFA) 110 MCG/ACT inhaler Inhale 2 puffs into the lungs daily as needed (shortness of breath).    Marland Kitchen glipiZIDE (GLUCOTROL) 5 MG tablet Take 5 mg by mouth daily before breakfast.     . HYDROcodone-acetaminophen (NORCO) 10-325 MG tablet Take 0.5 tablets by mouth every 4 (four) hours as  needed for severe pain. 30 tablet 0  . lidocaine (XYLOCAINE) 2 % solution USE AS DIRECTED 5 MLS IN THE MOUTH OR THROAT EVERY 6 (SIX) HOURS AS NEEDED FOR MOUTH PAIN. 100 mL 0  . lidocaine-prilocaine (EMLA) cream Apply 1 application topically as needed. Apply small amount to port site at least 1 hour prior to it being accessed, cover with plastic wrap 30 g 1  . losartan (COZAAR) 100 MG tablet TAKE 1 TABLET (100 MG TOTAL) BY MOUTH DAILY. (Patient taking differently: Take 100 mg by mouth every morning.) 90 tablet 0  . magic mouthwash w/lidocaine SOLN Take 5 mLs by mouth 4 (four) times daily as needed for mouth pain. Sig: Swish/Spit 5-10 ml four times a day as needed. Dispense 480 ml. 1RF 480 mL 1  . magnesium oxide (MAG-OX) 400 MG tablet TAKE 1 TABLET BY MOUTH EVERY DAY 90 tablet 1  . metFORMIN (GLUCOPHAGE) 500 MG tablet Take 1 tablet (500 mg total) by mouth daily with breakfast. 90 tablet 0  . metFORMIN (GLUCOPHAGE-XR) 500 MG 24 hr tablet Take 500 mg by mouth daily.    . mirtazapine (REMERON) 30 MG tablet Take 1 tablet (30 mg total) by mouth at bedtime. 30 tablet 0  . nystatin cream (MYCOSTATIN) Apply 1 application topically 2 (two) times daily as needed (yeast).     . pantoprazole (PROTONIX) 20 MG tablet Take 20 mg by mouth daily before lunch.     . phenol (CHLORASEPTIC) 1.4 % LIQD Use as directed 1 spray in the mouth or throat as needed for throat irritation / pain.  0  . polyvinyl alcohol (LIQUIFILM TEARS) 1.4 % ophthalmic solution Place 1 drop into both eyes as needed for dry eyes. 15 mL 0  . sitaGLIPtin (JANUVIA) 100 MG tablet Take 100 mg by mouth every morning.    . sodium chloride (OCEAN) 0.65 % SOLN nasal spray Place 1 spray into both nostrils as needed for congestion.  0  . sodium fluoride (LURIDE) 1.1 (0.5 F) MG/ML SOLN Take 1 drop by mouth at bedtime. One dental application at bedtime.    . sucralfate (CARAFATE) 1 g tablet Take 1 tablet (1 g total) by mouth 4 (four) times daily. 120 tablet 11   . sucralfate (CARAFATE) 1 g tablet Take 1 tablet (1 g total) by mouth 3 (three) times daily. Dissolve in 3-4 tbsp warm water, swish and swallow. 90 tablet 3  . VITAMIN D PO Take 1 capsule by mouth daily.     No current facility-administered medications for this visit.   Facility-Administered Medications Ordered in Other Visits  Medication Dose Route Frequency Provider Last Rate Last Admin  . sodium chloride flush (NS) 0.9 % injection 10 mL  10 mL Intravenous PRN Corcoran, Drue Second, MD   10 mL  at 12/03/20 1138    Review of Systems  Constitutional: Negative for chills, diaphoresis, fever, malaise/fatigue and weight loss.       Feels "alright."  HENT: Negative for congestion, ear discharge, ear pain, hearing loss, nosebleeds, sinus pain, sore throat and tinnitus.        Xerostomia.  Taste is coming back. Tongue burns a little.   Eyes: Negative for blurred vision.  Respiratory: Negative for cough, hemoptysis, sputum production and shortness of breath.   Cardiovascular: Negative for chest pain, palpitations and leg swelling.  Gastrointestinal: Negative for abdominal pain, blood in stool, constipation, diarrhea, heartburn, melena, nausea and vomiting.       Drinks 4 Boosts per day  Genitourinary: Negative for dysuria, frequency, hematuria and urgency.  Musculoskeletal: Negative for back pain, joint pain, myalgias and neck pain.  Skin: Negative for itching and rash.  Neurological: Negative for dizziness, tingling, sensory change, weakness and headaches.  Endo/Heme/Allergies: Does not bruise/bleed easily.  Psychiatric/Behavioral: Negative for depression and memory loss. The patient is not nervous/anxious and does not have insomnia.   All other systems reviewed and are negative.  Performance status (ECOG): 1-2***  Vital Signs: There were no vitals taken for this visit.  Physical Exam Vitals and nursing note reviewed.  Constitutional:      General: He is not in acute distress.     Appearance: He is not diaphoretic.  HENT:     Head: Normocephalic and atraumatic.     Mouth/Throat:     Mouth: Mucous membranes are moist.     Pharynx: Oropharynx is clear.  Eyes:     General: No scleral icterus.    Extraocular Movements: Extraocular movements intact.     Conjunctiva/sclera: Conjunctivae normal.     Pupils: Pupils are equal, round, and reactive to light.  Cardiovascular:     Rate and Rhythm: Normal rate and regular rhythm.     Heart sounds: Normal heart sounds. No murmur heard.   Pulmonary:     Effort: Pulmonary effort is normal. No respiratory distress.     Breath sounds: Normal breath sounds. No wheezing or rales.  Chest:     Chest wall: No tenderness.  Breasts:     Right: No axillary adenopathy or supraclavicular adenopathy.     Left: No axillary adenopathy or supraclavicular adenopathy.    Abdominal:     General: Bowel sounds are normal. There is no distension.     Palpations: Abdomen is soft. There is no mass.     Tenderness: There is no abdominal tenderness. There is no guarding or rebound.  Musculoskeletal:        General: No swelling or tenderness. Normal range of motion.     Cervical back: Normal range of motion and neck supple.  Lymphadenopathy:     Head:     Right side of head: No preauricular, posterior auricular or occipital adenopathy.     Left side of head: No preauricular, posterior auricular or occipital adenopathy.     Cervical: No cervical adenopathy.     Upper Body:     Right upper body: No supraclavicular or axillary adenopathy.     Left upper body: No supraclavicular or axillary adenopathy.     Lower Body: No right inguinal adenopathy. No left inguinal adenopathy.  Skin:    General: Skin is warm and dry.  Neurological:     Mental Status: He is alert and oriented to person, place, and time.  Psychiatric:  Behavior: Behavior normal.        Thought Content: Thought content normal.        Judgment: Judgment normal.     No  visits with results within 3 Day(s) from this visit.  Latest known visit with results is:  Appointment on 01/28/2021  Component Date Value Ref Range Status  . EBV DNA QN by PCR 01/28/2021 Negative  Negative IU/mL Final   Comment: (NOTE) No EBV DNA detected. The linear range of this assay is 35 - 100,000,000 IU/mL Performed At: [facility] [address] [place], [place] [id number] Rush Farmer MD WU:[id number]     Assessment:  Kenneth Franklin is a 75 y.o. 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  Pathology revealed keratinizing squamous cell carcinoma, moderately differentiated.   Head MRI on 08/13/2020 revealed a 5.9 cm nasopharyngeal mass consistent with known nasopharyngeal carcinoma with involvement of the clivus and sphenoid sinus. There was moderate chronic small vessel ischemic disease and cerebral atrophy.  There was no intracranial extension.  PET scan on 08/18/2020 revealed a 5.7 x 4.2 cm destructive hypermetabolic mass (SUV 95.62) involving the sphenoid sinus, skull base and posterior nasopharynx.  This was consistent with patient's known squamous cell carcinoma. There was no locoregional lymphadenopathy or distant metastatic disease.  He received IMRT from 09/08/2020 - 10/21/2020.  He received 5 weeks of cisplatin (09/08/2020 - 09/29/2020; 10/13/2020 - 10/20/2020).  He did not receive treatment on 10/06/2020 secondary to poor urine output.  Otolaryngologic exam on 12/22/2020 revealed left nasal cavity septal deviation, convex, airway obstruction 75-100%.    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 Audiogram on 12/22/2020 was stable. Hearing aid evaluation was recommended.  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 Hearing aid evaluation was  recommended.  He was [facility] to [facility] from 10/25/2020 - 11/01/2020 with mucositis.  He was treated with Magic mouthwash, viscous lidocaine, IV morphine and Toradol.  Symptomatically, ***  Plan: 1.   Clinical [id number] nasopharyngeal carcinoma             Tumor was unresectable.             PET scan revealed no evidence of distant disease             Treatment plan: upfront radiation and chemotherapy followed by adjuvant chemotherapy                         Cisplatin 40 mg/m2 weekly x 6-7 with radiation followed by cisplatin 80 mg/m2 D1 + 5FU 1000 mg/m2/day CI D1-4 q 28 days x 3 cycles             Baseline audiogram confirmed hearing loss associated with prior exposure.             He received 5 weeks of cisplatin with radiation (completed 10/21/2020).                         Decision made by radiation oncology to truncate radiation by 3 fractions secondary to mucositis.             Endoscopy on 12/22/2020 revealed left nasal cavity septal deviation, convex, airway obstruction 75-100%.  Review original plan original plan for adjuvant chemotherapy after concurrent chemotherapy and radiation.  NCCN  guidelines reviewed.   Consider adjuvant chemotherapy if high risk features present (bulky tumor volume, high serum EBV DNA copy number).  Patient presented at tumor board on 12/24/2020.   Recommendation was to consider 3 cycles of adjuvant chemotherapy.  Patient currently leaning against treatment.  He will contact our office next week.  Discuss plans for follow-up imaging at least 3 months s/p XRT. 2.   High frequency hearing loss             Audiogram on 12/22/2020 revealed bilateral sensorineural hearing loss sloping from mild to severe in the high frequencies.   Hearing aide evaluation was recommended.  Symptomatically, he denies any change in hearing. 3.   Xerostomia  He has a dry mouth s/p radiation.  He notes benefit with the trial of SalivaMax.  Assist with additional SalivaMax Rx. 4.    Weight loss            Patient notes weight is stable.            He is drinking 4 boosts/day. 5.   Contact patient's daughter next week- consider EBV DNA testing. 6.   RTC based on above.  I discussed the assessment and treatment plan with the patient.  The patient was provided an opportunity to ask questions and all were answered.  The patient agreed with the plan and demonstrated an understanding of the instructions.  The patient was advised to call back if the symptoms worsen or if the condition fails to improve as anticipated.  I provided *** minutes of face-to-face time during this this encounter and > 50% was spent counseling as documented under my assessment and plan.  Melissa C. Mike Gip, MD, PhD    01/08/2021, 9:26 AM  I, Mirian Mo Tufford, am acting as Education administrator for [employer]. Mike Gip, MD, PhD.  I, Melissa C. Mike Gip, MD, have reviewed the above documentation for accuracy and completeness, and I agree with the above.

## 2021-02-02 NOTE — Telephone Encounter (Signed)
Pt called and requested a call back. He would like to discuss his treatment tomorrow and stated that he has questions. Pt stated that if he does not receive a call back today he would like to "hold off on treatment tomorrow".

## 2021-02-02 NOTE — Telephone Encounter (Signed)
Ok perfect--so we will cancel his appt for tomorrow and wait for him or his daughter to call back.

## 2021-02-03 ENCOUNTER — Other Ambulatory Visit: Payer: Medicare Other

## 2021-02-03 ENCOUNTER — Ambulatory Visit: Payer: Medicare Other

## 2021-02-03 ENCOUNTER — Inpatient Hospital Stay: Payer: Medicare Other | Admitting: Hematology and Oncology

## 2021-02-10 ENCOUNTER — Inpatient Hospital Stay: Payer: Medicare Other | Admitting: Hematology and Oncology

## 2021-02-10 ENCOUNTER — Telehealth: Payer: Self-pay | Admitting: Hematology and Oncology

## 2021-02-10 NOTE — Telephone Encounter (Signed)
  Let's reschedule for next week.  M

## 2021-02-10 NOTE — Telephone Encounter (Signed)
02/10/2021 Spoke w/ pt and his daughter and got him re-scheduled for 3/23 @ 10:45. Pt confirmed appts  SRW

## 2021-02-10 NOTE — Telephone Encounter (Signed)
Patient left voicemail to call him back to reschedule his appt  Says to sick to come today.

## 2021-02-17 ENCOUNTER — Inpatient Hospital Stay (HOSPITAL_BASED_OUTPATIENT_CLINIC_OR_DEPARTMENT_OTHER): Payer: Medicare Other | Admitting: Hematology and Oncology

## 2021-02-17 ENCOUNTER — Encounter: Payer: Self-pay | Admitting: Hematology and Oncology

## 2021-02-17 ENCOUNTER — Other Ambulatory Visit: Payer: Self-pay

## 2021-02-17 VITALS — BP 101/63 | HR 110 | Temp 97.2°F | Resp 16 | Wt 155.9 lb

## 2021-02-17 DIAGNOSIS — K117 Disturbances of salivary secretion: Secondary | ICD-10-CM | POA: Diagnosis not present

## 2021-02-17 DIAGNOSIS — C119 Malignant neoplasm of nasopharynx, unspecified: Secondary | ICD-10-CM

## 2021-02-17 DIAGNOSIS — R634 Abnormal weight loss: Secondary | ICD-10-CM | POA: Diagnosis not present

## 2021-02-17 DIAGNOSIS — Z7189 Other specified counseling: Secondary | ICD-10-CM

## 2021-02-17 DIAGNOSIS — H9190 Unspecified hearing loss, unspecified ear: Secondary | ICD-10-CM

## 2021-02-17 NOTE — Progress Notes (Signed)
Pt sores in his mouth is good now. He has HA sometimes but not today. He says he drinks good and drinks ensure but eating not as good. He says that sometimes he is not hungry and sometimes he does not like taste . Taste buds is just not good

## 2021-02-17 NOTE — Progress Notes (Signed)
[facility]  [address], [address] [place], [place] [address] Phone: [phone]  Fax: [phone]   Clinic Day: 02/17/21  Referring physician: Verita Lamb, NP  Chief Complaint: Kenneth Franklin is a 75 y.o. male with stage III nasopharyngeal carcinoma who is seen for continued discussion regarding initiation of cisplatin and 5FU.  HPI: The patient was last seen in the oncology clinic on 01/08/2021 via telemedicine. At that time, he continued to improve. He had a dry mouth. Taste sensation was slowly improving.  We discussed the conversation at tumor board.  Recommendation was to consider 3 cycles of adjuvant chemotherapy.  He was leaning against treatment.    The patient spoke to Jennet Maduro, RD on 02/01/2021 by phone. He still could not taste and was not really eating solid foods. He was drinking 4 Ensures per day. The patient was encouraged to increase Ensure intake.  EBV was negative on 01/28/2021.  During the interim, he has been good. His taste has improved but his appetite is not great. His mouth is still dry, but it has been better since he started using a mouthwash at night. He drinks Ensures/Boosts. His energy is low. He denies nausea, vomiting, and diarrhea.  The patient agrees to a PET scan.   Past Medical History:  Diagnosis Date  . Acute ischemic stroke ([place]) 2017  . Alcohol abuse   . Anxiety   . Cancer ([place])   . Depression   . Diabetes mellitus without complication ([place])   . Dyspnea    pcp knows and ordered rescue inhaler  . Hyperlipidemia   . Hypertension   . Skin cancer    Squamous Cell Carcinoma In Situ    Past Surgical History:  Procedure Laterality Date  . CATARACT EXTRACTION Bilateral   . COLONOSCOPY    . NASOPHARYNGOSCOPY N/A 08/12/2020   Procedure: ENDOSCOPIC NASOPHARYNGOSCOPY WITH BIOPSY;  Surgeon: Margaretha Sheffield, MD;  Location: [facility] ORS;  Service: ENT;  Laterality: N/A;  . PORTA CATH INSERTION N/A 08/24/2020    Procedure: PORTA CATH INSERTION;  Surgeon: Algernon Huxley, MD;  Location: [place] [facility];  Service: Cardiovascular;  Laterality: N/A;    Family History  Problem Relation Age of Onset  . Diabetes Brother     Social History:  reports that he quit smoking about 18 months ago. His smoking use included cigars and cigarettes. He has a 40.00 pack-year smoking history. He has never used smokeless tobacco. He reports previous alcohol use. He reports that he does not use drugs. He smoked 1 pack/day x 40 years.  He denies exposure of radiation or toxins; he did work with asphalt. He lives alone in [place]. He is widowed. Ernestina Patches (step daughter) can be reached at [phone]. The patient is accompanied by Otila Kluver today.  Allergies:  Allergies  Allergen Reactions  . Propoxyphene     Unknown reaction    Current Medications: Current Outpatient Medications  Medication Sig Dispense Refill  . ALPRAZolam (XANAX) 0.25 MG tablet TAKE 1 TABLET EVERY DAY AS NEEDED FOR ANXIETY (Patient taking differently: Take 0.25 mg by mouth daily as needed for anxiety.) 30 tablet 2  . aspirin EC 81 MG tablet Take 81 mg by mouth daily. Swallow whole.    Marland Kitchen atorvastatin (LIPITOR) 80 MG tablet TAKE 1 TABLET (80 MG TOTAL) BY MOUTH NIGHTLY. (Patient taking differently: Take 80 mg by mouth every evening.) 90 tablet 0  . busPIRone (BUSPAR) 10 MG tablet TAKE 1 TABLET (10 MG  TOTAL) BY MOUTH 2 (TWO) TIMES DAILY. (Patient taking differently: Take 10 mg by mouth at bedtime.) 180 tablet 0  . clopidogrel (PLAVIX) 75 MG tablet TAKE 1 TABLET (75 MG TOTAL) BY MOUTH DAILY. 90 tablet 0  . DENTA 5000 PLUS 1.1 % CREA dental cream Place 1 application onto teeth at bedtime.     Marland Kitchen escitalopram (LEXAPRO) 20 MG tablet Take 20 mg by mouth every morning.     . feeding supplement (ENSURE ENLIVE / ENSURE PLUS) LIQD Take 237 mLs by mouth 3 (three) times daily between meals. 237 mL 12  . fluticasone (FLOVENT HFA) 110 MCG/ACT inhaler Inhale 2 puffs  into the lungs daily as needed (shortness of breath).    Marland Kitchen glipiZIDE (GLUCOTROL) 5 MG tablet Take 5 mg by mouth daily before breakfast.     . lidocaine-prilocaine (EMLA) cream Apply 1 application topically as needed. Apply small amount to port site at least 1 hour prior to it being accessed, cover with plastic wrap 30 g 1  . losartan (COZAAR) 100 MG tablet TAKE 1 TABLET (100 MG TOTAL) BY MOUTH DAILY. (Patient taking differently: Take 100 mg by mouth every morning.) 90 tablet 0  . metFORMIN (GLUCOPHAGE) 500 MG tablet Take 1 tablet (500 mg total) by mouth daily with breakfast. 90 tablet 0  . mirtazapine (REMERON) 30 MG tablet Take 1 tablet (30 mg total) by mouth at bedtime. 30 tablet 0  . phenol (CHLORASEPTIC) 1.4 % LIQD Use as directed 1 spray in the mouth or throat as needed for throat irritation / pain.  0  . polyvinyl alcohol (LIQUIFILM TEARS) 1.4 % ophthalmic solution Place 1 drop into both eyes as needed for dry eyes. 15 mL 0  . sodium chloride (OCEAN) 0.65 % SOLN nasal spray Place 1 spray into both nostrils as needed for congestion.  0  . lidocaine (XYLOCAINE) 2 % solution USE AS DIRECTED 5 MLS IN THE MOUTH OR THROAT EVERY 6 (SIX) HOURS AS NEEDED FOR MOUTH PAIN. (Patient not taking: Reported on 02/17/2021) 100 mL 0  . magic mouthwash w/lidocaine SOLN Take 5 mLs by mouth 4 (four) times daily as needed for mouth pain. Sig: Swish/Spit 5-10 ml four times a day as needed. Dispense 480 ml. 1RF (Patient not taking: Reported on 02/17/2021) 480 mL 1  . magnesium oxide (MAG-OX) 400 MG tablet TAKE 1 TABLET BY MOUTH EVERY DAY (Patient not taking: Reported on 02/17/2021) 90 tablet 1  . nystatin cream (MYCOSTATIN) Apply 1 application topically 2 (two) times daily as needed (yeast).  (Patient not taking: Reported on 02/17/2021)    . pantoprazole (PROTONIX) 20 MG tablet Take 20 mg by mouth daily before lunch.  (Patient not taking: Reported on 02/17/2021)    . sitaGLIPtin (JANUVIA) 100 MG tablet Take 100 mg by mouth  every morning. (Patient not taking: Reported on 02/17/2021)    . VITAMIN D PO Take 1 capsule by mouth daily. (Patient not taking: Reported on 02/17/2021)     No current facility-administered medications for this visit.   Facility-Administered Medications Ordered in Other Visits  Medication Dose Route Frequency Provider Last Rate Last Admin  . sodium chloride flush (NS) 0.9 % injection 10 mL  10 mL Intravenous PRN Lequita Asal, MD   10 mL at 12/03/20 1138    Review of Systems  Constitutional: Positive for malaise/fatigue (low energy) and weight loss (2 lbs since 11/2020). Negative for chills, diaphoresis and fever.  HENT: Negative for congestion, ear discharge, ear pain, hearing loss,  nosebleeds, sinus pain, sore throat and tinnitus.        Xerostomia, improved. Taste improved.  Eyes: Negative for blurred vision.  Respiratory: Negative for cough, hemoptysis, sputum production and shortness of breath.   Cardiovascular: Negative for chest pain, palpitations and leg swelling.  Gastrointestinal: Negative for abdominal pain, blood in stool, constipation, diarrhea, heartburn, melena, nausea and vomiting.       Poor appetite. Drinks Ensures/Boosts.  Genitourinary: Negative for dysuria, frequency, hematuria and urgency.  Musculoskeletal: Negative for back pain, joint pain, myalgias and neck pain.  Skin: Negative for itching and rash.  Neurological: Negative for dizziness, tingling, sensory change, weakness and headaches.  Endo/Heme/Allergies: Does not bruise/bleed easily.  Psychiatric/Behavioral: Negative for depression and memory loss. The patient is not nervous/anxious and does not have insomnia.   All other systems reviewed and are negative.  Performance status (ECOG): 1-2  Vital Signs: Blood pressure 101/63, pulse (!) 110, temperature (!) 97.2 F (36.2 C), temperature source Tympanic, resp. rate 16, weight 155 lb 13.8 oz (70.7 kg), SpO2 98 %.  Physical Exam Vitals and nursing note  reviewed.  Constitutional:      General: He is not in acute distress.    Appearance: He is not diaphoretic.     Comments: Patient sitting in wheelchair in no acute distress. Cane by his side. Able to get onto exam table.  HENT:     Head: Normocephalic and atraumatic.     Mouth/Throat:     Mouth: Mucous membranes are moist.     Pharynx: Oropharynx is clear.  Eyes:     General: No scleral icterus.    Extraocular Movements: Extraocular movements intact.     Conjunctiva/sclera: Conjunctivae normal.     Pupils: Pupils are equal, round, and reactive to light.  Cardiovascular:     Rate and Rhythm: Normal rate and regular rhythm.     Heart sounds: Normal heart sounds. No murmur heard.   Pulmonary:     Effort: Pulmonary effort is normal. No respiratory distress.     Breath sounds: Normal breath sounds. No wheezing or rales.  Chest:     Chest wall: No tenderness.  Breasts:     Right: No axillary adenopathy or supraclavicular adenopathy.     Left: No axillary adenopathy or supraclavicular adenopathy.    Abdominal:     General: Bowel sounds are normal. There is no distension.     Palpations: Abdomen is soft. There is no mass.     Tenderness: There is no abdominal tenderness. There is no guarding or rebound.  Musculoskeletal:        General: No swelling or tenderness. Normal range of motion.     Cervical back: Normal range of motion and neck supple.  Lymphadenopathy:     Head:     Right side of head: No preauricular, posterior auricular or occipital adenopathy.     Left side of head: No preauricular, posterior auricular or occipital adenopathy.     Cervical: No cervical adenopathy.     Upper Body:     Right upper body: No supraclavicular or axillary adenopathy.     Left upper body: No supraclavicular or axillary adenopathy.     Lower Body: No right inguinal adenopathy. No left inguinal adenopathy.  Skin:    General: Skin is warm and dry.  Neurological:     Mental Status: He is  alert and oriented to person, place, and time.  Psychiatric:        Behavior: Behavior normal.  Thought Content: Thought content normal.        Judgment: Judgment normal.     No visits with results within 3 Day(s) from this visit.  Latest known visit with results is:  Appointment on 01/28/2021  Component Date Value Ref Range Status  . EBV DNA QN by PCR 01/28/2021 Negative  Negative IU/mL Final   Comment: (NOTE) No EBV DNA detected. The linear range of this assay is 35 - 100,000,000 IU/mL Performed At: [facility] [address] [place], [place] [id number] Rush Farmer MD EG:[id number]     Assessment:  DURELLE ZEPEDA is a 75 y.o. 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  Pathology revealed keratinizing squamous cell carcinoma, moderately differentiated.   Head MRI on 08/13/2020 revealed a 5.9 cm nasopharyngeal mass consistent with known nasopharyngeal carcinoma with involvement of the clivus and sphenoid sinus. There was moderate chronic small vessel ischemic disease and cerebral atrophy.  There was no intracranial extension.  PET scan on 08/18/2020 revealed a 5.7 x 4.2 cm destructive hypermetabolic mass (SUV 37.10) involving the sphenoid sinus, skull base and posterior nasopharynx.  This was consistent with patient's known squamous cell carcinoma. There was no locoregional lymphadenopathy or distant metastatic disease.  He received IMRT from 09/08/2020 - 10/21/2020.  He received 5 weeks of cisplatin (09/08/2020 - 09/29/2020; 10/13/2020 - 10/20/2020).  He did not receive treatment on 10/06/2020 secondary to poor urine output.  Otolaryngologic exam on 12/22/2020 revealed left nasal cavity septal deviation, convex, airway obstruction 75-100%.  EBV was negative on 01/28/2021.   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  Audiogram on 12/22/2020 was stable. Hearing aid evaluation was recommended.  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 Hearing aid evaluation was recommended.  He was admitted to [facility] from 10/25/2020 - 11/01/2020 with mucositis.  He was treated with Magic mouthwash, viscous lidocaine, IV morphine and Toradol.  Symptomatically, he feels good, but tired. His taste has improved but his appetite is not great. His mouth is dry.  Plan: 1.   Clinical [id number] nasopharyngeal carcinoma             Tumor was unresectable.             PET scan revealed no evidence of distant disease             Treatment plan: upfront radiation and chemotherapy followed by adjuvant chemotherapy                         Cisplatin 40 mg/m2 weekly x 6-7 with radiation followed by cisplatin 80 mg/m2 D1 + 5FU 1000 mg/m2/day CI D1-4 q 28 days x 3 cycles             Baseline audiogram confirmed hearing loss associated with prior exposure.             He received 5 weeks of cisplatin with radiation (completed 10/21/2020).                         Decision made by radiation oncology to truncate radiation by 3 fractions secondary to mucositis.             Endoscopy on 12/22/2020 revealed left nasal cavity septal deviation, convex, airway obstruction 75-100%.  Original plan was  for adjuvant chemotherapy after concurrent chemotherapy and radiation.   Per NCCN guidelines:  adjuvant chemotherapy if high risk features present (bulky tumor volume, high serum EBV DNA copy number).   Patient had bulky disease.   EBV was negative.  Tumor board on 12/24/2020 recommended consideration of 3 cycles of adjuvant chemotherapy.   Patient leaned against treatment.  Discuss significant delays in initiation of adjuvant treatment and now 3 months s/p XRT.  Discuss plan for restaging PET scan.   Patient in agreement. 2.   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   Hearing aide evaluation was recommended.  He denies any change in hearing. 3.   Xerostomia  He has a dry mouth s/p radiation.  He experienced a benefit with SalivaMax.  He is using a mouthwash at night 4.   Weight loss            Weight loss of 2 pounds.            Courage caloric intake and supplemental drinks (Ensure/Boost). 5.   PET scan 02/24/2021. 6.   Tumor board on 02/25/2021. 7.   RTC on 02/25/2021 for MD assessment and review of imaging.  I discussed the assessment and treatment plan with the patient.  The patient was provided an opportunity to ask questions and all were answered.  The patient agreed with the plan and demonstrated an understanding of the instructions.  The patient was advised to call back if the symptoms worsen or if the condition fails to improve as anticipated.  I provided 17 minutes of face-to-face time during this this encounter and > 50% was spent counseling as documented under my assessment and plan.  An additional 5 minutes were spent reviewing his chart (Epic and Care Everywhere) including notes, labs, and imaging studies.    Lindie Roberson C. Mike Gip, MD, PhD    02/17/2021 , 12:31 PM  I, Mirian Mo Tufford, am acting as Education administrator for [employer]. Mike Gip, MD, PhD.  I, Ambermarie Honeyman C. Mike Gip, MD, have reviewed the above documentation for accuracy and completeness, and I agree with the above.

## 2021-02-22 ENCOUNTER — Other Ambulatory Visit: Payer: Self-pay | Admitting: Hematology and Oncology

## 2021-02-22 ENCOUNTER — Ambulatory Visit
Admission: RE | Admit: 2021-02-22 | Discharge: 2021-02-22 | Disposition: A | Discharge status: Home / Self Care | Payer: Medicare Other | Source: Ambulatory Visit | Attending: Hematology and Oncology | Admitting: Hematology and Oncology

## 2021-02-22 ENCOUNTER — Other Ambulatory Visit: Payer: Self-pay

## 2021-02-22 DIAGNOSIS — C119 Malignant neoplasm of nasopharynx, unspecified: Secondary | ICD-10-CM

## 2021-02-22 LAB — GLUCOSE, CAPILLARY: Glucose-Capillary: 114 mg/dL — ABNORMAL HIGH (ref 70–99)

## 2021-02-22 MED ORDER — FLUDEOXYGLUCOSE F - 18 (FDG) INJECTION
8.0000 | Freq: Once | INTRAVENOUS | Status: AC | PRN
  Administered 2021-02-22: 8.66 via INTRAVENOUS

## 2021-02-23 ENCOUNTER — Other Ambulatory Visit: Payer: Self-pay | Admitting: Hematology and Oncology

## 2021-02-23 DIAGNOSIS — C119 Malignant neoplasm of nasopharynx, unspecified: Secondary | ICD-10-CM

## 2021-02-23 NOTE — Progress Notes (Signed)
[facility]  [address], [address] [place], [place] [address] Phone: [phone]  Fax: [phone]   Clinic Day: 02/24/21  Referring physician: Verita Lamb, NP  Chief Complaint: Kenneth Franklin is a 75 y.o. male with stage III nasopharyngeal carcinoma who is seen for review of interval PET scan and discussion regarding direction of therapy.  HPI: The patient was last seen in the oncology clinic on 02/17/2021. At that time, he felt good, but tired. His taste had improved but his appetite was not great. His mouth was dry.  PET scan on 02/22/2021 revealed interval decrease in size and degree of FDG uptake associated with the FDG avid mass involving the sphenoid sinus, posterior nasopharynx and skull base. There were no signs of locoregional lymphadenopathy or distant metastatic disease. Increased tracer uptake was identified within the esophagus. Correlate for any clinical signs or symptoms of esophagitis.  Patient is scheduled for tumor board tomorrow (02/25/2021).  During the interim, he has been "hanging in there." He has had a headache for the past week. He has tried OTC medication, but would like something stronger.  He uses [employer] advanced healing rinse for dry mouth. His dentist told him about it.   Past Medical History:  Diagnosis Date  . Acute ischemic stroke ([place]) 2017  . Alcohol abuse   . Anxiety   . Cancer ([place])   . Depression   . Diabetes mellitus without complication ([place])   . Dyspnea    pcp knows and ordered rescue inhaler  . Hyperlipidemia   . Hypertension   . Skin cancer    Squamous Cell Carcinoma In Situ    Past Surgical History:  Procedure Laterality Date  . CATARACT EXTRACTION Bilateral   . COLONOSCOPY    . NASOPHARYNGOSCOPY N/A 08/12/2020   Procedure: ENDOSCOPIC NASOPHARYNGOSCOPY WITH BIOPSY;  Surgeon: Margaretha Sheffield, MD;  Location: [facility] ORS;  Service: ENT;  Laterality: N/A;  . PORTA CATH INSERTION N/A 08/24/2020    Procedure: PORTA CATH INSERTION;  Surgeon: Algernon Huxley, MD;  Location: [facility] [place] [facility];  Service: Cardiovascular;  Laterality: N/A;    Family History  Problem Relation Age of Onset  . Diabetes Brother     Social History:  reports that he quit smoking about 18 months ago. His smoking use included cigars and cigarettes. He has a 40.00 pack-year smoking history. He has never used smokeless tobacco. He reports previous alcohol use. He reports that he does not use drugs. He smoked 1 pack/day x 40 years.  He denies exposure of radiation or toxins; he did work with asphalt. He lives alone in [place]. He is widowed. Ernestina Patches (step daughter) can be reached at [phone]. The patient is accompanied by a family member today.  Allergies:  Allergies  Allergen Reactions  . Propoxyphene     Unknown reaction    Current Medications: Current Outpatient Medications  Medication Sig Dispense Refill  . ALPRAZolam (XANAX) 0.25 MG tablet TAKE 1 TABLET EVERY DAY AS NEEDED FOR ANXIETY (Patient taking differently: Take 0.25 mg by mouth daily as needed for anxiety.) 30 tablet 2  . aspirin EC 81 MG tablet Take 81 mg by mouth daily. Swallow whole.    Marland Kitchen atorvastatin (LIPITOR) 80 MG tablet TAKE 1 TABLET (80 MG TOTAL) BY MOUTH NIGHTLY. (Patient taking differently: Take 80 mg by mouth every evening.) 90 tablet 0  . busPIRone (BUSPAR) 10 MG tablet TAKE 1 TABLET (10 MG TOTAL) BY MOUTH 2 (TWO) TIMES DAILY. (  Patient taking differently: Take 10 mg by mouth at bedtime.) 180 tablet 0  . clopidogrel (PLAVIX) 75 MG tablet TAKE 1 TABLET (75 MG TOTAL) BY MOUTH DAILY. 90 tablet 0  . DENTA 5000 PLUS 1.1 % CREA dental cream Place 1 application onto teeth at bedtime.     Marland Kitchen escitalopram (LEXAPRO) 20 MG tablet Take 20 mg by mouth every morning.     . feeding supplement (ENSURE ENLIVE / ENSURE PLUS) LIQD Take 237 mLs by mouth 3 (three) times daily between meals. 237 mL 12  . fluticasone (FLOVENT HFA) 110 MCG/ACT inhaler  Inhale 2 puffs into the lungs daily as needed (shortness of breath).    Marland Kitchen glipiZIDE (GLUCOTROL) 5 MG tablet Take 5 mg by mouth daily before breakfast.     . HYDROcodone-acetaminophen (NORCO) 5-325 MG tablet Take 1 tablet by mouth every 6 (six) hours as needed for moderate pain. 20 tablet 0  . lidocaine-prilocaine (EMLA) cream Apply 1 application topically as needed. Apply small amount to port site at least 1 hour prior to it being accessed, cover with plastic wrap 30 g 1  . losartan (COZAAR) 100 MG tablet TAKE 1 TABLET (100 MG TOTAL) BY MOUTH DAILY. (Patient taking differently: Take 100 mg by mouth every morning.) 90 tablet 0  . metFORMIN (GLUCOPHAGE) 500 MG tablet Take 1 tablet (500 mg total) by mouth daily with breakfast. 90 tablet 0  . metFORMIN (GLUCOPHAGE-XR) 500 MG 24 hr tablet Take 500 mg by mouth daily.    . mirtazapine (REMERON) 30 MG tablet Take 1 tablet (30 mg total) by mouth at bedtime. 30 tablet 0  . phenol (CHLORASEPTIC) 1.4 % LIQD Use as directed 1 spray in the mouth or throat as needed for throat irritation / pain.  0  . polyvinyl alcohol (LIQUIFILM TEARS) 1.4 % ophthalmic solution Place 1 drop into both eyes as needed for dry eyes. 15 mL 0  . sodium chloride (OCEAN) 0.65 % SOLN nasal spray Place 1 spray into both nostrils as needed for congestion.  0  . lidocaine (XYLOCAINE) 2 % solution USE AS DIRECTED 5 MLS IN THE MOUTH OR THROAT EVERY 6 (SIX) HOURS AS NEEDED FOR MOUTH PAIN. (Patient not taking: No sig reported) 100 mL 0  . magic mouthwash w/lidocaine SOLN Take 5 mLs by mouth 4 (four) times daily as needed for mouth pain. Sig: Swish/Spit 5-10 ml four times a day as needed. Dispense 480 ml. 1RF (Patient not taking: No sig reported) 480 mL 1  . magnesium oxide (MAG-OX) 400 MG tablet TAKE 1 TABLET BY MOUTH EVERY DAY (Patient not taking: No sig reported) 90 tablet 1  . nystatin cream (MYCOSTATIN) Apply 1 application topically 2 (two) times daily as needed (yeast).  (Patient not taking:  No sig reported)    . pantoprazole (PROTONIX) 20 MG tablet Take 20 mg by mouth daily before lunch.  (Patient not taking: No sig reported)    . sitaGLIPtin (JANUVIA) 100 MG tablet Take 100 mg by mouth every morning. (Patient not taking: No sig reported)    . VITAMIN D PO Take 1 capsule by mouth daily. (Patient not taking: No sig reported)     No current facility-administered medications for this visit.   Facility-Administered Medications Ordered in Other Visits  Medication Dose Route Frequency Provider Last Rate Last Admin  . sodium chloride flush (NS) 0.9 % injection 10 mL  10 mL Intravenous PRN Lequita Asal, MD   10 mL at 12/03/20 1138  Review of Systems  Constitutional: Positive for weight loss (1 lb). Negative for chills, diaphoresis, fever and malaise/fatigue.  HENT: Negative for congestion, ear discharge, ear pain, hearing loss, nosebleeds, sinus pain, sore throat and tinnitus.   Eyes: Negative for blurred vision.  Respiratory: Negative for cough, hemoptysis, sputum production and shortness of breath.   Cardiovascular: Negative for chest pain, palpitations and leg swelling.  Gastrointestinal: Negative for abdominal pain, blood in stool, constipation, diarrhea, heartburn, melena, nausea and vomiting.  Genitourinary: Negative for dysuria, frequency, hematuria and urgency.  Musculoskeletal: Negative for back pain, joint pain, myalgias and neck pain.  Skin: Negative for itching and rash.  Neurological: Positive for headaches (x 1 week). Negative for dizziness, tingling, sensory change and weakness.  Endo/Heme/Allergies: Does not bruise/bleed easily.  Psychiatric/Behavioral: Negative for depression and memory loss. The patient is not nervous/anxious and does not have insomnia.   All other systems reviewed and are negative.  Performance status (ECOG): 1-2  Vital Signs: Blood pressure 108/66, pulse (!) 106, temperature (!) 97 F (36.1 C), temperature source Tympanic, resp. rate  18, weight 154 lb 5.2 oz (70 kg), SpO2 97 %.  Physical Exam Vitals and nursing note reviewed.  Constitutional:      General: He is not in acute distress.    Appearance: He is not diaphoretic.  Eyes:     General: No scleral icterus.    Conjunctiva/sclera: Conjunctivae normal.  Neurological:     Mental Status: He is alert and oriented to person, place, and time.  Psychiatric:        Behavior: Behavior normal.        Thought Content: Thought content normal.        Judgment: Judgment normal.     Hospital Outpatient Visit on 02/22/2021  Component Date Value Ref Range Status  . Glucose-Capillary 02/22/2021 114* 70 - 99 mg/dL Final   Glucose reference range applies only to samples taken after fasting for at least 8 hours.    Assessment:  GORDON VANDUNK is a 75 y.o. 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  Pathology revealed keratinizing squamous cell carcinoma, moderately differentiated.   Head MRI on 08/13/2020 revealed a 5.9 cm nasopharyngeal mass consistent with known nasopharyngeal carcinoma with involvement of the clivus and sphenoid sinus. There was moderate chronic small vessel ischemic disease and cerebral atrophy.  There was no intracranial extension.  PET scan on 08/18/2020 revealed a 5.7 x 4.2 cm destructive hypermetabolic mass (SUV 66.06) involving the sphenoid sinus, skull base and posterior nasopharynx.  This was consistent with patient's known squamous cell carcinoma. There was no locoregional lymphadenopathy or distant metastatic disease.  He received IMRT from 09/08/2020 - 10/21/2020.  He received 5 weeks of cisplatin (09/08/2020 - 09/29/2020; 10/13/2020 - 10/20/2020).  He did not receive treatment on 10/06/2020 secondary to poor urine output.  Otolaryngologic exam on 12/22/2020 revealed left nasal cavity septal deviation, convex, airway obstruction 75-100%.  EBV was negative on 01/28/2021.   PET scan on 02/22/2021 revealed interval  decrease in size and degree of FDG uptake associated with the FDG avid mass involving the sphenoid sinus, posterior nasopharynx and skull base. There were no signs of locoregional lymphadenopathy or distant metastatic disease. Increased tracer uptake was identified within the esophagus. Correlate for any clinical signs or symptoms of esophagitis.  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 Audiogram on 12/22/2020 was stable.  Hearing aid evaluation was recommended.  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 Hearing aid evaluation was recommended.  He was admitted to [facility] from 10/25/2020 - 11/01/2020 with mucositis.  He was treated with Magic mouthwash, viscous lidocaine, IV morphine and Toradol.  Symptomatically, he has had a headache for a week.  He denies any numbness weakness balance or coordination issues.  Plan: 1.   Clinical [id number] nasopharyngeal carcinoma             Tumor was unresectable.             PET scan revealed no evidence of distant disease             Treatment plan: upfront radiation and chemotherapy followed by adjuvant chemotherapy                         Cisplatin 40 mg/m2 weekly x 6-7 with radiation followed by cisplatin 80 mg/m2 D1 + 5FU 1000 mg/m2/day CI D1-4 q 28 days x 3 cycles             Baseline audiogram confirmed hearing loss associated with prior exposure.             He received 5 weeks of cisplatin with radiation (completed 10/21/2020).                         Decision made by radiation oncology to truncate radiation by 3 fractions secondary to mucositis.             Endoscopy on 12/22/2020 revealed left nasal cavity septal deviation, convex, airway obstruction 75-100%.  Original plan was for adjuvant chemotherapy after concurrent chemotherapy and radiation.   Per [facility]  guidelines:  adjuvant chemotherapy if high risk features present (bulky tumor volume, high serum EBV DNA copy number).   Patient had bulky disease.   EBV was negative.  Tumor board on 12/24/2020 recommended consideration of 3 cycles of adjuvant chemotherapy.   Patient leaned against treatment.  PET scan on 02/22/2021 was personally reviewed.  Agree with radiology findings.   FDG uptake has decreased but is still present.  Discuss plans for follow-up head MRI.  Present at tumor board on 02/25/2021. 2.   High frequency hearing loss             Audiogram on 12/22/2020 revealed bilateral sensorineural hearing loss sloping from mild to severe in the high frequencies.   Hearing aide evaluation was recommended.  He denies any change in hearing. 3.   Xerostomia  He has a dry mouth s/p radiation.  He is using [employer] life advanced healing rinse which is working well  4.   Headache            Anticipate head MRI.  Rx:  hydrocodone/Tylenol 5/325 mg 1 tablet po q 6 hours prn pain (dis: #20). 5.   Tumor board on 02/25/2021. 6.   MD to call patient after tumor board. 7.   RTC in 1 month for MD assessment and +/- labs..  I discussed the assessment and treatment plan with the patient.  The patient was provided an opportunity to ask questions and all were answered.  The patient agreed with the plan and demonstrated an understanding of the instructions.  The patient was advised to call back if the symptoms worsen or if the condition fails to improve as anticipated.   Tyjai Charbonnet  Elmarie Mainland, MD, PhD    02/24/2021 , 2:41 PM  I, Mirian Mo Tufford, am acting as Education administrator for [employer]. Mike Gip, MD, PhD.  I, Kynisha Memon C. Mike Gip, MD, have reviewed the above documentation for accuracy and completeness, and I agree with the above.

## 2021-02-24 ENCOUNTER — Encounter: Payer: Self-pay | Admitting: Hematology and Oncology

## 2021-02-24 ENCOUNTER — Encounter: Payer: Self-pay | Admitting: Emergency Medicine

## 2021-02-24 ENCOUNTER — Emergency Department
Admission: EM | Admit: 2021-02-24 | Discharge: 2021-02-24 | Disposition: A | Discharge status: Home / Self Care | Payer: Medicare Other | Attending: Emergency Medicine | Admitting: Emergency Medicine

## 2021-02-24 ENCOUNTER — Emergency Department: Payer: Medicare Other

## 2021-02-24 ENCOUNTER — Inpatient Hospital Stay (HOSPITAL_BASED_OUTPATIENT_CLINIC_OR_DEPARTMENT_OTHER): Payer: Medicare Other | Admitting: Hematology and Oncology

## 2021-02-24 ENCOUNTER — Other Ambulatory Visit: Payer: Self-pay

## 2021-02-24 VITALS — BP 108/66 | HR 106 | Temp 97.0°F | Resp 18 | Wt 154.3 lb

## 2021-02-24 DIAGNOSIS — G4485 Primary stabbing headache: Secondary | ICD-10-CM | POA: Insufficient documentation

## 2021-02-24 DIAGNOSIS — Z7984 Long term (current) use of oral hypoglycemic drugs: Secondary | ICD-10-CM | POA: Insufficient documentation

## 2021-02-24 DIAGNOSIS — Z7982 Long term (current) use of aspirin: Secondary | ICD-10-CM | POA: Insufficient documentation

## 2021-02-24 DIAGNOSIS — Z85828 Personal history of other malignant neoplasm of skin: Secondary | ICD-10-CM | POA: Diagnosis not present

## 2021-02-24 DIAGNOSIS — Z87891 Personal history of nicotine dependence: Secondary | ICD-10-CM | POA: Insufficient documentation

## 2021-02-24 DIAGNOSIS — I10 Essential (primary) hypertension: Secondary | ICD-10-CM | POA: Diagnosis not present

## 2021-02-24 DIAGNOSIS — Z7902 Long term (current) use of antithrombotics/antiplatelets: Secondary | ICD-10-CM | POA: Diagnosis not present

## 2021-02-24 DIAGNOSIS — R519 Headache, unspecified: Secondary | ICD-10-CM | POA: Diagnosis not present

## 2021-02-24 DIAGNOSIS — C119 Malignant neoplasm of nasopharynx, unspecified: Secondary | ICD-10-CM | POA: Diagnosis not present

## 2021-02-24 DIAGNOSIS — J449 Chronic obstructive pulmonary disease, unspecified: Secondary | ICD-10-CM | POA: Insufficient documentation

## 2021-02-24 DIAGNOSIS — Z79899 Other long term (current) drug therapy: Secondary | ICD-10-CM | POA: Diagnosis not present

## 2021-02-24 DIAGNOSIS — E119 Type 2 diabetes mellitus without complications: Secondary | ICD-10-CM | POA: Diagnosis not present

## 2021-02-24 LAB — URINALYSIS, COMPLETE (UACMP) WITH MICROSCOPIC
Bacteria, UA: NONE SEEN
Bilirubin Urine: NEGATIVE
Glucose, UA: NEGATIVE mg/dL
Hgb urine dipstick: NEGATIVE
Ketones, ur: NEGATIVE mg/dL
Leukocytes,Ua: NEGATIVE
Nitrite: NEGATIVE
Protein, ur: NEGATIVE mg/dL
Specific Gravity, Urine: 1.01 (ref 1.005–1.030)
Squamous Epithelial / HPF: NONE SEEN (ref 0–5)
pH: 5 (ref 5.0–8.0)

## 2021-02-24 LAB — BASIC METABOLIC PANEL WITH GFR
Anion gap: 13 (ref 5–15)
BUN: 25 mg/dL — ABNORMAL HIGH (ref 8–23)
CO2: 27 mmol/L (ref 22–32)
Calcium: 10.3 mg/dL (ref 8.9–10.3)
Chloride: 97 mmol/L — ABNORMAL LOW (ref 98–111)
Creatinine, Ser: 1.01 mg/dL (ref 0.61–1.24)
GFR, Estimated: >60 mL/min
Glucose, Bld: 126 mg/dL — ABNORMAL HIGH (ref 70–99)
Potassium: 4.8 mmol/L (ref 3.5–5.1)
Sodium: 137 mmol/L (ref 135–145)

## 2021-02-24 LAB — CBC
HCT: 37.7 % — ABNORMAL LOW (ref 39.0–52.0)
Hemoglobin: 12.4 g/dL — ABNORMAL LOW (ref 13.0–17.0)
MCH: 29.9 pg (ref 26.0–34.0)
MCHC: 32.9 g/dL (ref 30.0–36.0)
MCV: 90.8 fL (ref 80.0–100.0)
Platelets: 285 10*3/uL (ref 150–400)
RBC: 4.15 MIL/uL — ABNORMAL LOW (ref 4.22–5.81)
RDW: 12.9 % (ref 11.5–15.5)
WBC: 9.5 10*3/uL (ref 4.0–10.5)
nRBC: 0 % (ref 0.0–0.2)

## 2021-02-24 MED ORDER — FAMOTIDINE 40 MG PO TABS: 40.0000 mg | ORAL_TABLET | Freq: Every evening | ORAL | 1 refills | Status: DC

## 2021-02-24 MED ORDER — MORPHINE SULFATE (PF) 4 MG/ML IV SOLN
4.0000 mg | Freq: Once | INTRAVENOUS | Status: AC
Start: 2021-02-24 — End: 2021-02-24
  Administered 2021-02-24: 4 mg via INTRAVENOUS
  Filled 2021-02-24: qty 1

## 2021-02-24 MED ORDER — HYDROCODONE-ACETAMINOPHEN 5-325 MG PO TABS: 1.0000 | ORAL_TABLET | Freq: Four times a day (QID) | ORAL | 0 refills | Status: DC | PRN

## 2021-02-24 MED ORDER — SODIUM CHLORIDE 0.9 % IV BOLUS
1000.0000 mL | Freq: Once | INTRAVENOUS | Status: AC
  Administered 2021-02-24: 1000 mL via INTRAVENOUS

## 2021-02-24 MED ORDER — FAMOTIDINE IN NACL 20-0.9 MG/50ML-% IV SOLN
20.0000 mg | Freq: Once | INTRAVENOUS | Status: AC
  Administered 2021-02-24: 20 mg via INTRAVENOUS
  Filled 2021-02-24: qty 50

## 2021-02-24 NOTE — ED Provider Notes (Signed)
[facility] Emergency Department Provider Note   ____________________________________________   Event Date/Time   First MD Initiated Contact with Patient 02/24/21 1731     (approximate)  I have reviewed the triage vital signs and the nursing notes.   HISTORY  Chief Complaint Headache    HPI Kenneth Franklin is a 75 y.o. male with a past medical history of anxiety, alcohol abuse, type 2 diabetes, and recently diagnosed with a sinus tumor who presents for severe headache that has been stable over the last 3 days.  Patient describes throbbing in the frontal head that does not radiate and is worsened with any coughing or sneezing.  Patient denies any relieving factors but has been using hydrocodone/APAP 10/325 to no effect.  Patient saw his oncologist, Dr. Mike Gip, earlier today who placed him on this medication.  Unfortunately, patient is not a candidate for surgery given the tumor proximity to the brain.  Patient currently denies any vision changes, tinnitus, difficulty speaking, facial droop, sore throat, chest pain, shortness of breath, abdominal pain, nausea/vomiting/diarrhea, dysuria, or weakness/numbness/paresthesias in any extremity         Past Medical History:  Diagnosis Date  . Acute ischemic stroke ([place]) 2017  . Alcohol abuse   . Anxiety   . Cancer ([place])   . Depression   . Diabetes mellitus without complication ([place])   . Dyspnea    pcp knows and ordered rescue inhaler  . Hyperlipidemia   . Hypertension   . Skin cancer    Squamous Cell Carcinoma In Situ    Patient Active Problem List   Diagnosis Date Noted  . Xerostomia 12/23/2020  . Constipation 12/03/2020  . Weight loss 12/03/2020  . Malnutrition of moderate degree 10/26/2020  . Painful swallowing 10/25/2020  . Inadequate oral intake 10/25/2020  . Mucositis due to antineoplastic therapy 10/25/2020  . Mucositis due to radiation therapy 10/25/2020  . Odynophagia 10/25/2020  .  Dehydration 10/22/2020  . Nausea without vomiting 10/22/2020  . Poor appetite 10/22/2020  . Goals of care, counseling/discussion 09/21/2020  . Hypokalemia 09/15/2020  . 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 Encounter for antineoplastic chemotherapy 09/15/2020  . Hypomagnesemia 09/08/2020  . 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 Chronic obstructive pulmonary disease with acute exacerbation ([place]) 10/01/2019  . 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 Alcohol abuse 06/01/2017  . Tylenol overdose 05/31/2017  . Erroneous encounter - disregard 08/02/2016  . Acute ischemic stroke ([place]) 07/07/2016  . Tobacco use disorder 06/28/2016  . Tick bite of flank 06/16/2016  . Grief counseling 06/16/2016  . Generalized weakness 06/16/2016  . Absent pulse in lower extremity 09/14/2015  . Anxiety and depression 04/21/2015  . Diabetes mellitus type 2, controlled, without complications ([place]) 37/16/9678  . Acid reflux 04/21/2015  . Calcium blood increased 04/21/2015  . Dyslipidemia 04/21/2015  . HLD (hyperlipidemia) 04/21/2015  . BP (high blood pressure) 04/21/2015    Past Surgical History:  Procedure Laterality Date  . CATARACT EXTRACTION Bilateral   . COLONOSCOPY    . NASOPHARYNGOSCOPY N/A 08/12/2020   Procedure: ENDOSCOPIC NASOPHARYNGOSCOPY WITH BIOPSY;  Surgeon: Margaretha Sheffield, MD;  Location: [facility] ORS;  Service: ENT;  Laterality: N/A;  . PORTA CATH INSERTION N/A 08/24/2020   Procedure: PORTA CATH INSERTION;  Surgeon: Algernon Huxley, MD;  Location: [facility];  Service: Cardiovascular;  Laterality: N/A;    Prior to Admission medications   Medication Sig Start Date End Date Taking? Authorizing Provider  famotidine (PEPCID) 40 MG tablet Take 1 tablet (40 mg total) by mouth every evening. 02/24/21 02/24/22 Yes Margrit Minner, Vista Lawman, MD  ALPRAZolam (XANAX) 0.25 MG tablet TAKE 1 TABLET EVERY DAY AS NEEDED FOR ANXIETY Patient taking differently: Take  0.25 mg by mouth daily as needed for anxiety. 10/26/16   Roselee Nova, MD  aspirin EC 81 MG tablet Take 81 mg by mouth daily. Swallow whole.    [provider]  atorvastatin (LIPITOR) 80 MG tablet TAKE 1 TABLET (80 MG TOTAL) BY MOUTH NIGHTLY. Patient taking differently: Take 80 mg by mouth every evening. 11/14/16   Roselee Nova, MD  busPIRone (BUSPAR) 10 MG tablet TAKE 1 TABLET (10 MG TOTAL) BY MOUTH 2 (TWO) TIMES DAILY. Patient taking differently: Take 10 mg by mouth at bedtime. 08/16/16   Roselee Nova, MD  clopidogrel (PLAVIX) 75 MG tablet TAKE 1 TABLET (75 MG TOTAL) BY MOUTH DAILY. 07/25/16   Roselee Nova, MD  DENTA 5000 PLUS 1.1 % CREA dental cream Place 1 application onto teeth at bedtime.  03/18/20   [provider]  escitalopram (LEXAPRO) 20 MG tablet Take 20 mg by mouth every morning.     [provider]  feeding supplement (ENSURE ENLIVE / ENSURE PLUS) LIQD Take 237 mLs by mouth 3 (three) times daily between meals. 11/01/20   Vashti Hey, MD  fluticasone (FLOVENT HFA) 110 MCG/ACT inhaler Inhale 2 puffs into the lungs daily as needed (shortness of breath).    [provider]  glipiZIDE (GLUCOTROL) 5 MG tablet Take 5 mg by mouth daily before breakfast.     [provider]  HYDROcodone-acetaminophen (NORCO) 5-325 MG tablet Take 1 tablet by mouth every 6 (six) hours as needed for moderate pain. 02/24/21   Lequita Asal, MD  lidocaine (XYLOCAINE) 2 % solution USE AS DIRECTED 5 MLS IN THE MOUTH OR THROAT EVERY 6 (SIX) HOURS AS NEEDED FOR MOUTH PAIN. Patient not taking: No sig reported 11/13/20   Sindy Guadeloupe, MD  lidocaine-prilocaine (EMLA) cream Apply 1 application topically as needed. Apply small amount to port site at least 1 hour prior to it being accessed, cover with plastic wrap 08/24/20   Lequita Asal, MD  losartan (COZAAR) 100 MG tablet TAKE 1 TABLET (100 MG TOTAL) BY MOUTH DAILY. Patient taking  differently: Take 100 mg by mouth every morning. 08/16/16   Roselee Nova, MD  magic mouthwash w/lidocaine SOLN Take 5 mLs by mouth 4 (four) times daily as needed for mouth pain. Sig: Swish/Spit 5-10 ml four times a day as needed. Dispense 480 ml. 1RF Patient not taking: No sig reported 11/12/20   Verlon Au, NP  magnesium oxide (MAG-OX) 400 MG tablet TAKE 1 TABLET BY MOUTH EVERY DAY Patient not taking: No sig reported 11/18/20   Lequita Asal, MD  metFORMIN (GLUCOPHAGE) 500 MG tablet Take 1 tablet (500 mg total) by mouth daily with breakfast. 05/18/16   Roselee Nova, MD  metFORMIN (GLUCOPHAGE-XR) 500 MG 24 hr tablet Take 500 mg by mouth daily. 02/23/21   [provider]  mirtazapine (REMERON) 30 MG tablet Take 1 tablet (30 mg total) by mouth at bedtime. 06/02/17   Fritzi Mandes, MD  nystatin cream (MYCOSTATIN) Apply 1 application topically 2 (two) times daily as needed (yeast).  Patient not taking: No sig reported 05/13/20   [provider]  pantoprazole (PROTONIX) 20 MG tablet Take 20 mg by  mouth daily before lunch.  Patient not taking: No sig reported    [provider]  phenol (CHLORASEPTIC) 1.4 % LIQD Use as directed 1 spray in the mouth or throat as needed for throat irritation / pain. 11/01/20   Vashti Hey, MD  polyvinyl alcohol (LIQUIFILM TEARS) 1.4 % ophthalmic solution Place 1 drop into both eyes as needed for dry eyes. 11/01/20   Vashti Hey, MD  sitaGLIPtin (JANUVIA) 100 MG tablet Take 100 mg by mouth every morning. Patient not taking: No sig reported    [provider]  sodium chloride (OCEAN) 0.65 % SOLN nasal spray Place 1 spray into both nostrils as needed for congestion. 11/01/20   Vashti Hey, MD  VITAMIN D PO Take 1 capsule by mouth daily. Patient not taking: No sig reported    [provider]  prochlorperazine (COMPAZINE) 10 MG tablet Take 1 tablet (10 mg total) by mouth every 6  (six) hours as needed (Nausea or vomiting). 09/08/20 12/28/20  Lequita Asal, MD    Allergies Propoxyphene  Family History  Problem Relation Age of Onset  . Diabetes Brother     Social History Social History   Tobacco Use  . Smoking status: Former Smoker    Packs/day: 1.00    Years: 40.00    Pack years: 40.00    Types: Cigars, Cigarettes    Quit date: 08/12/2019    Years since quitting: 1.5  . Smokeless tobacco: Never Used  Vaping Use  . Vaping Use: Never used  Substance Use Topics  . Alcohol use: Not Currently    Comment: H/O ETOH ABUSE BUT DENIES DRINKING DURING 08-11-20 INTERVIEW  . Drug use: No    Review of Systems Constitutional: No fever/chills Eyes: No visual changes. ENT: No sore throat. Cardiovascular: Denies chest pain. Respiratory: Denies shortness of breath. Gastrointestinal: No abdominal pain.  No nausea, no vomiting.  No diarrhea. Genitourinary: Negative for dysuria. Musculoskeletal: Negative for acute arthralgias Skin: Negative for rash. Neurological: Positive for headaches, negative for weakness/numbness/paresthesias in any extremity Psychiatric: Negative for suicidal ideation/homicidal ideation   ____________________________________________   PHYSICAL EXAM:  VITAL SIGNS: ED Triage Vitals [02/24/21 1716]  Enc Vitals Group     BP 112/78     Pulse Rate (!) 101     Resp 18     Temp 97.9 F (36.6 C)     Temp Source Oral     SpO2 97 %     Weight 155 lb (70.3 kg)     Height 6' (1.829 m)     Head Circumference      Peak Flow      Pain Score 10     Pain Loc      Pain Edu?      Excl. in [place]?    Constitutional: Alert and oriented. Well appearing Caucasian elderly male in no acute distress. Eyes: Conjunctivae are normal. PERRL. Head: Atraumatic. Nose: No congestion/rhinnorhea. Mouth/Throat: Mucous membranes are moist. Neck: No stridor Cardiovascular: Grossly normal heart sounds.  Good peripheral circulation. Respiratory: Normal  respiratory effort.  No retractions. Gastrointestinal: Soft and nontender. No distention. Musculoskeletal: No obvious deformities Neurologic:  Normal speech and language. No gross focal neurologic deficits are appreciated. Skin:  Skin is warm and dry. No rash noted. Psychiatric: Mood and affect are normal. Speech and behavior are normal.  ____________________________________________   LABS (all labs ordered are listed, but only abnormal results are displayed)  Labs Reviewed  CBC - Abnormal; Notable for  the following components:      Result Value   RBC 4.15 (*)    Hemoglobin 12.4 (*)    HCT 37.7 (*)    All other components within normal limits  URINALYSIS, COMPLETE (UACMP) WITH MICROSCOPIC - Abnormal; Notable for the following components:   Color, Urine STRAW (*)    APPearance CLEAR (*)    All other components within normal limits  BASIC METABOLIC PANEL - Abnormal; Notable for the following components:   Chloride 97 (*)    Glucose, Bld 126 (*)    BUN 25 (*)    All other components within normal limits   RADIOLOGY  ED MD interpretation: CT of the head without contrast shows a heterogeneous mass within the sphenoid sinus extending into the nasopharynx that is unchanged from prior PET scan  Official radiology report(s): CT HEAD WO CONTRAST  Result Date: 02/24/2021 CLINICAL DATA:  Headache, history of nasopharyngeal cancer EXAM: CT HEAD WITHOUT CONTRAST TECHNIQUE: Contiguous axial images were obtained from the base of the skull through the vertex without intravenous contrast. COMPARISON:  PET-CT February 14, 2021 FINDINGS: Brain: No evidence of acute territorial infarction, hemorrhage, hydrocephalus,extra-axial collection or mass lesion/mass effect. There is dilatation the ventricles and sulci consistent with age-related atrophy. Low-attenuation changes in the deep white matter consistent with small vessel ischemia. Vascular: No hyperdense vessel or unexpected calcification. Skull: A  large heterogeneous mass seen within the area of the sphenoid sinus with surrounding cortical destruction involving the sphenoid sinuses and extending into the clivus. Sinuses/Orbits: As described above a heterogeneous mass within the sphenoid sinuses extending into the nasopharynx. Other: None IMPRESSION: Heterogeneous mass within the sphenoid sinus extending into the nasopharynx causing osseous destruction of the sphenoid sinuses and clivus, unchanged from prior PET-CT. Findings consistent with age related atrophy and chronic small vessel ischemia Electronically Signed   By: Prudencio Pair M.D.   On: 02/24/2021 17:46    ____________________________________________   PROCEDURES  Procedure(s) performed (including Critical Care):  .1-3 Lead EKG Interpretation Performed by: Naaman Plummer, MD Authorized by: Naaman Plummer, MD     Interpretation: normal     ECG rate:  81   ECG rate assessment: normal     Rhythm: sinus rhythm     Ectopy: none     Conduction: normal       ____________________________________________   INITIAL IMPRESSION / ASSESSMENT AND PLAN / ED COURSE  As part of my medical decision making, I reviewed the following data within the [place] notes reviewed and incorporated, Labs reviewed, Old chart reviewed, Radiograph reviewed and Notes from prior ED visits reviewed and incorporated        75 year old male with a history of sinus tumor presents with Headache.  No focal neurological symptoms. Neuro exam is benign. Pt is nontoxic. VSS.  Based on history and normal neurological exam I have low suspicion for intracranial tumor, intracranial bleed, meningitis, temporal arteritis, glaucoma, CO poisoning.  Most likely patient has benign headache related to his sinus tumor, recommend rest, hydration, and ibuprofen.  Disposition: Discharge home with strict return precautions and instructions for prompt primary care follow up.       ____________________________________________   FINAL CLINICAL IMPRESSION(S) / ED DIAGNOSES  Final diagnoses:  Primary stabbing headache     ED Discharge Orders         Ordered    famotidine (PEPCID) 40 MG tablet  Every evening        02/24/21 2104  Note:  This document was prepared using Dragon voice recognition software and may include unintentional dictation errors.   Naaman Plummer, MD 02/24/21 [phone]

## 2021-02-24 NOTE — Progress Notes (Signed)
Patient reports bad headaches over the last week

## 2021-02-24 NOTE — ED Triage Notes (Signed)
Pt comes into the ED via [facility] from home c/o headache.  Pt was diagnosed with a tumor in his sinuses that couldn't be removed due to it being too close to the brain.  Pt denies any history of migraines, but states the headache has been ongoing x 3 days.  Pt neurologically intact at this time and in NAD with even and unlabored respirations.  Pt's MD is concerned he may be dehydrated which is causing the headache.

## 2021-02-24 NOTE — ED Notes (Signed)
Undressed from waist up and placed in gown. Continuous cardiac monitoring.

## 2021-02-24 NOTE — ED Notes (Signed)
First nurse note: comes EMS from home. Recent dx of sinus cancer. Pain in face/head getting worse-can't sleep, eat, or drink due to the pain. VSS.

## 2021-02-25 ENCOUNTER — Other Ambulatory Visit: Payer: Medicare Other

## 2021-02-25 NOTE — Progress Notes (Signed)
Tumor Board Documentation  Kenneth Franklin was presented by Dr Mike Gip at our Tumor Board on 02/25/2021, which included representatives from medical oncology,radiation oncology,internal medicine,navigation,pathology,radiology,surgical,genetics,research,palliative care,pulmonology.  Kenneth Franklin currently presents as a current patient,for discussion with history of the following treatments: surgical intervention(s),active survellience,neoadjuvant radiation,neoadjuvant chemotherapy.  Additionally, we reviewed previous medical and familial history, history of present illness, and recent lab results along with all available histopathologic and imaging studies. The tumor board considered available treatment options and made the following recommendations: Additional screening (MRI Head)    The following procedures/referrals were also placed: No orders of the defined types were placed in this encounter.   Clinical Trial Status: not discussed   Staging used: AJCC Stage Group  AJCC Staging: T: 3 N: X M: X Group: Stage III Nasopharyngeal [place] site-specific guidelines NCCN were discussed with respect to the case.  Tumor board is a meeting of clinicians from various specialty areas who evaluate and discuss patients for whom a multidisciplinary approach is being considered. Final determinations in the plan of care are those of the provider(s). The responsibility for follow up of recommendations given during tumor board is that of the provider.   Today's extended care, comprehensive team conference, Kenneth Franklin was not present for the discussion and was not examined.   Multidisciplinary Tumor Board is a multidisciplinary case peer review process.  Decisions discussed in the Multidisciplinary Tumor Board reflect the opinions of the specialists present at the conference without having examined the patient.  Ultimately, treatment and diagnostic decisions rest with the primary provider(s) and the  patient.

## 2021-02-28 DIAGNOSIS — R519 Headache, unspecified: Secondary | ICD-10-CM | POA: Insufficient documentation

## 2021-03-01 ENCOUNTER — Inpatient Hospital Stay: Payer: Medicare Other | Attending: Hematology and Oncology

## 2021-03-01 NOTE — Progress Notes (Signed)
Nutrition Follow-up:  Patient with stage III nasopharyneal carcinoma.  Patient completed chemotherapy and radiation therapy.    Spoke with patient via phone for nutrition follow-up.  Patient has been in and out of ED recently due to headaches.  Says that he has to have another test on his brain.  Says that taste buds are slowly coming back but still can't taste everything.  Eats few bites and can't eat anything more than that.  Drinks shakes and has drank 4 so far today.  Has been drinking 8 per day.  Can't remember if he is drinking 350 calorie shake or not.     Medications: reviewed  Labs: reviewed  Anthropometrics:   Weight 155 lb 02/24/21.  157 lb on 12/23/20 165 lb on 11/22  UBW of 170-175 lb   NUTRITION DIAGNOSIS: Inadequate oral intake continues   INTERVENTION:  Patient to drink 6-7 cartons of 350 calorie shake daily to better meet nutritional needs (2100-2450 calories, 78-91 g protein)    MONITORING, EVALUATION, GOAL: intake, weight trends,    NEXT VISIT: as needed  Lj Miyamoto B. Zenia Resides, [place], [place] Registered Dietitian [phone] (mobile)

## 2021-03-03 ENCOUNTER — Other Ambulatory Visit: Payer: Self-pay

## 2021-03-03 ENCOUNTER — Emergency Department
Admission: EM | Admit: 2021-03-03 | Discharge: 2021-03-03 | Disposition: A | Discharge status: Home / Self Care | Payer: Medicare Other | Attending: Emergency Medicine | Admitting: Emergency Medicine

## 2021-03-03 DIAGNOSIS — Z85820 Personal history of malignant melanoma of skin: Secondary | ICD-10-CM | POA: Insufficient documentation

## 2021-03-03 DIAGNOSIS — Z79899 Other long term (current) drug therapy: Secondary | ICD-10-CM | POA: Diagnosis not present

## 2021-03-03 DIAGNOSIS — R52 Pain, unspecified: Secondary | ICD-10-CM

## 2021-03-03 DIAGNOSIS — R531 Weakness: Secondary | ICD-10-CM | POA: Diagnosis not present

## 2021-03-03 DIAGNOSIS — I10 Essential (primary) hypertension: Secondary | ICD-10-CM | POA: Insufficient documentation

## 2021-03-03 DIAGNOSIS — W19XXXA Unspecified fall, initial encounter: Secondary | ICD-10-CM

## 2021-03-03 DIAGNOSIS — Z7984 Long term (current) use of oral hypoglycemic drugs: Secondary | ICD-10-CM | POA: Insufficient documentation

## 2021-03-03 DIAGNOSIS — R519 Headache, unspecified: Secondary | ICD-10-CM | POA: Insufficient documentation

## 2021-03-03 DIAGNOSIS — Z87891 Personal history of nicotine dependence: Secondary | ICD-10-CM | POA: Insufficient documentation

## 2021-03-03 DIAGNOSIS — Z7982 Long term (current) use of aspirin: Secondary | ICD-10-CM | POA: Diagnosis not present

## 2021-03-03 DIAGNOSIS — W010XXA Fall on same level from slipping, tripping and stumbling without subsequent striking against object, initial encounter: Secondary | ICD-10-CM | POA: Insufficient documentation

## 2021-03-03 DIAGNOSIS — Z7902 Long term (current) use of antithrombotics/antiplatelets: Secondary | ICD-10-CM | POA: Insufficient documentation

## 2021-03-03 DIAGNOSIS — E119 Type 2 diabetes mellitus without complications: Secondary | ICD-10-CM | POA: Diagnosis not present

## 2021-03-03 LAB — CBC
HCT: 40.5 % (ref 39.0–52.0)
Hemoglobin: 13.1 g/dL (ref 13.0–17.0)
MCH: 29.7 pg (ref 26.0–34.0)
MCHC: 32.3 g/dL (ref 30.0–36.0)
MCV: 91.8 fL (ref 80.0–100.0)
Platelets: 332 10*3/uL (ref 150–400)
RBC: 4.41 MIL/uL (ref 4.22–5.81)
RDW: 13.2 % (ref 11.5–15.5)
WBC: 8 10*3/uL (ref 4.0–10.5)
nRBC: 0 % (ref 0.0–0.2)

## 2021-03-03 LAB — BASIC METABOLIC PANEL
Anion gap: 9 (ref 5–15)
BUN: 15 mg/dL (ref 8–23)
CO2: 28 mmol/L (ref 22–32)
Calcium: 9.9 mg/dL (ref 8.9–10.3)
Chloride: 102 mmol/L (ref 98–111)
Creatinine, Ser: 0.78 mg/dL (ref 0.61–1.24)
GFR, Estimated: >60 mL/min (ref >60)
Glucose, Bld: 184 mg/dL — ABNORMAL HIGH (ref 70–99)
Potassium: 4.4 mmol/L (ref 3.5–5.1)
Sodium: 139 mmol/L (ref 135–145)

## 2021-03-03 MED ORDER — KETOROLAC TROMETHAMINE 30 MG/ML IJ SOLN
30.0000 mg | Freq: Once | INTRAMUSCULAR | Status: DC
  Filled 2021-03-03: qty 1

## 2021-03-03 MED ORDER — METHOCARBAMOL 500 MG PO TABS
500.0000 mg | ORAL_TABLET | Freq: Once | ORAL | Status: AC
  Administered 2021-03-03: 500 mg via ORAL
  Filled 2021-03-03 (×2): qty 1

## 2021-03-03 MED ORDER — BUTALBITAL-APAP-CAFFEINE 50-325-40 MG PO TABS
2.0000 | ORAL_TABLET | Freq: Once | ORAL | Status: AC
  Administered 2021-03-03: 2 via ORAL
  Filled 2021-03-03: qty 2

## 2021-03-03 MED ORDER — DEXAMETHASONE SODIUM PHOSPHATE 10 MG/ML IJ SOLN
8.0000 mg | Freq: Once | INTRAMUSCULAR | Status: AC
  Administered 2021-03-03: 8 mg via INTRAVENOUS
  Filled 2021-03-03: qty 1

## 2021-03-03 MED ORDER — GABAPENTIN 300 MG PO CAPS
400.0000 mg | ORAL_CAPSULE | Freq: Once | ORAL | Status: AC
  Administered 2021-03-03: 400 mg via ORAL
  Filled 2021-03-03: qty 1

## 2021-03-03 MED ORDER — HYDROMORPHONE HCL 1 MG/ML IJ SOLN
0.5000 mg | Freq: Once | INTRAMUSCULAR | Status: AC
Start: 2021-03-03 — End: 2021-03-03
  Administered 2021-03-03: 0.5 mg via INTRAVENOUS
  Filled 2021-03-03: qty 1

## 2021-03-03 MED ORDER — DEXAMETHASONE SODIUM PHOSPHATE 10 MG/ML IJ SOLN
8.0000 mg | Freq: Once | INTRAMUSCULAR | Status: DC
  Filled 2021-03-03: qty 1

## 2021-03-03 MED ORDER — ACETAMINOPHEN 500 MG PO TABS
1000.0000 mg | ORAL_TABLET | Freq: Once | ORAL | Status: AC
  Administered 2021-03-03: 1000 mg via ORAL
  Filled 2021-03-03: qty 2

## 2021-03-03 MED ORDER — HYDROMORPHONE HCL 1 MG/ML IJ SOLN
1.0000 mg | Freq: Once | INTRAMUSCULAR | Status: DC
  Filled 2021-03-03: qty 1

## 2021-03-03 MED ORDER — OXYCODONE HCL 5 MG PO TABS
5.0000 mg | ORAL_TABLET | Freq: Once | ORAL | Status: AC
  Administered 2021-03-03: 5 mg via ORAL
  Filled 2021-03-03: qty 1

## 2021-03-03 MED ORDER — KETOROLAC TROMETHAMINE 30 MG/ML IJ SOLN
15.0000 mg | Freq: Once | INTRAMUSCULAR | Status: AC
  Administered 2021-03-03: 15 mg via INTRAVENOUS
  Filled 2021-03-03: qty 1

## 2021-03-03 MED ORDER — METHOCARBAMOL 500 MG PO TABS: 500.0000 mg | ORAL_TABLET | Freq: Four times a day (QID) | ORAL | 0 refills | Status: DC | PRN

## 2021-03-03 NOTE — ED Provider Notes (Signed)
[facility] Emergency Department Provider Note ____________________________________________   Event Date/Time   First MD Initiated Contact with Patient 03/03/21 1618     (approximate)  I have reviewed the triage vital signs and the nursing notes.  HISTORY  Chief Complaint Fall and Weakness   HPI Kenneth Franklin is a 75 y.o. malewho presents to the ED for evaluation of headache.  Chart review indicates history of anxiety and depression, DM, GERD, HTN, HLD and alcohol abuse.  Nasopharyngeal carcinoma s/p radiation and chemotherapy about 6 months ago.  Patient saw neurology as an outpatient yesterday due to intractable chronic headaches, and was provided empiric prescriptions for gabapentin and Depakote.  Noted to have a normal examination yesterday.  Patient presents to the ED with his brother, who provides additional history, for evaluation of acute on chronic headaches.  Patient primarily reports concerned that his headaches are controlled during his recurrent ED visits over the past few months, but he has difficulty controlling them at home.  He reports 8/10 intensity headache throughout the day yesterday, when he saw his neurologist, and today he was reporting 20/10 global atraumatic headache.  He reports this is the typical pain he has been experiencing, but much more severe today.  He reports that he has not taken any medications today.  Patient reports a fall that occurred this morning.  He reports getting up and then his legs giving out, and him stumbling down to the ground.  He reports catching himself and denies syncope.  He denies head injury or significant injury to the limbs.  Review of the chart indicates that today is the seventh ED visit since 3/30 for the patient for his headaches.  Last CT scan of the brain was on 4/3 where patient had a benign work-up in the [facility], and was discharged with Imitrex added to his regimen.   Past  Medical History:  Diagnosis Date  . Acute ischemic stroke ([place]) 2017  . Alcohol abuse   . Anxiety   . Cancer ([place])   . Depression   . Diabetes mellitus without complication ([place])   . Dyspnea    pcp knows and ordered rescue inhaler  . Hyperlipidemia   . Hypertension   . Skin cancer    Squamous Cell Carcinoma In Situ    Patient Active Problem List   Diagnosis Date Noted  . Acute nonintractable headache 02/28/2021  . Xerostomia 12/23/2020  . Constipation 12/03/2020  . Weight loss 12/03/2020  . Malnutrition of moderate degree 10/26/2020  . Painful swallowing 10/25/2020  . Inadequate oral intake 10/25/2020  . Mucositis due to antineoplastic therapy 10/25/2020  . Mucositis due to radiation therapy 10/25/2020  . Odynophagia 10/25/2020  . Dehydration 10/22/2020  . Nausea without vomiting 10/22/2020  . Poor appetite 10/22/2020  . Goals of care, counseling/discussion 09/21/2020  . Hypokalemia 09/15/2020  . 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 Encounter for antineoplastic chemotherapy 09/15/2020  . Hypomagnesemia 09/08/2020  . Nasopharyngeal carcinoma ([place]) 08/18/2020  . Chronic obstructive pulmonary disease with acute exacerbation ([place]) 10/01/2019  . Severe recurrent major depression without psychotic features ([place]) 06/01/2017  . Alcohol abuse 06/01/2017  . Tylenol overdose 05/31/2017  . Erroneous encounter - disregard 08/02/2016  . Acute ischemic stroke ([place]) 07/07/2016  . Tobacco use disorder 06/28/2016  . Tick bite of flank 06/16/2016  . Grief counseling 06/16/2016  . Generalized weakness 06/16/2016  . Absent pulse in lower extremity 09/14/2015  . Anxiety and depression 04/21/2015  .  Diabetes mellitus type 2, controlled, without complications ([place]) 00/86/7619  . Acid reflux 04/21/2015  . Calcium blood increased 04/21/2015  . Dyslipidemia 04/21/2015  . HLD (hyperlipidemia) 04/21/2015  . BP (high blood pressure) 04/21/2015    Past Surgical History:  Procedure  Laterality Date  . CATARACT EXTRACTION Bilateral   . COLONOSCOPY    . NASOPHARYNGOSCOPY N/A 08/12/2020   Procedure: ENDOSCOPIC NASOPHARYNGOSCOPY WITH BIOPSY;  Surgeon: Margaretha Sheffield, MD;  Location: [facility] ORS;  Service: ENT;  Laterality: N/A;  . PORTA CATH INSERTION N/A 08/24/2020   Procedure: PORTA CATH INSERTION;  Surgeon: Algernon Huxley, MD;  Location: [place] [facility];  Service: Cardiovascular;  Laterality: N/A;    Prior to Admission medications   Medication Sig Start Date End Date Taking? Authorizing Provider  methocarbamol (ROBAXIN) 500 MG tablet Take 1 tablet (500 mg total) by mouth every 6 (six) hours as needed for muscle spasms (breakthrough pain). 03/03/21  Yes Vladimir Crofts, MD  ALPRAZolam Duanne Moron) 0.25 MG tablet TAKE 1 TABLET EVERY DAY AS NEEDED FOR ANXIETY Patient taking differently: Take 0.25 mg by mouth daily as needed for anxiety. 10/26/16   Roselee Nova, MD  aspirin EC 81 MG tablet Take 81 mg by mouth daily. Swallow whole.    [provider]  atorvastatin (LIPITOR) 80 MG tablet TAKE 1 TABLET (80 MG TOTAL) BY MOUTH NIGHTLY. Patient taking differently: Take 80 mg by mouth every evening. 11/14/16   Roselee Nova, MD  busPIRone (BUSPAR) 10 MG tablet TAKE 1 TABLET (10 MG TOTAL) BY MOUTH 2 (TWO) TIMES DAILY. Patient taking differently: Take 10 mg by mouth at bedtime. 08/16/16   Roselee Nova, MD  clopidogrel (PLAVIX) 75 MG tablet TAKE 1 TABLET (75 MG TOTAL) BY MOUTH DAILY. 07/25/16   Roselee Nova, MD  DENTA 5000 PLUS 1.1 % CREA dental cream Place 1 application onto teeth at bedtime.  03/18/20   [provider]  escitalopram (LEXAPRO) 20 MG tablet Take 20 mg by mouth every morning.     [provider]  famotidine (PEPCID) 40 MG tablet Take 1 tablet (40 mg total) by mouth every evening. 02/24/21 02/24/22  Naaman Plummer, MD  feeding supplement (ENSURE ENLIVE / ENSURE PLUS) LIQD Take 237 mLs by mouth 3 (three) times daily between meals. 11/01/20    Vashti Hey, MD  fluticasone (FLOVENT HFA) 110 MCG/ACT inhaler Inhale 2 puffs into the lungs daily as needed (shortness of breath).    [provider]  glipiZIDE (GLUCOTROL) 5 MG tablet Take 5 mg by mouth daily before breakfast.     [provider]  HYDROcodone-acetaminophen (NORCO) 5-325 MG tablet Take 1 tablet by mouth every 6 (six) hours as needed for moderate pain. 02/24/21   Lequita Asal, MD  lidocaine (XYLOCAINE) 2 % solution USE AS DIRECTED 5 MLS IN THE MOUTH OR THROAT EVERY 6 (SIX) HOURS AS NEEDED FOR MOUTH PAIN. Patient not taking: No sig reported 11/13/20   Sindy Guadeloupe, MD  lidocaine-prilocaine (EMLA) cream Apply 1 application topically as needed. Apply small amount to port site at least 1 hour prior to it being accessed, cover with plastic wrap 08/24/20   Lequita Asal, MD  losartan (COZAAR) 100 MG tablet TAKE 1 TABLET (100 MG TOTAL) BY MOUTH DAILY. Patient taking differently: Take 100 mg by mouth every morning. 08/16/16   Roselee Nova, MD  magic mouthwash w/lidocaine SOLN Take 5 mLs by mouth 4 (four) times  daily as needed for mouth pain. Sig: Swish/Spit 5-10 ml four times a day as needed. Dispense 480 ml. 1RF Patient not taking: No sig reported 11/12/20   Verlon Au, NP  magnesium oxide (MAG-OX) 400 MG tablet TAKE 1 TABLET BY MOUTH EVERY DAY Patient not taking: No sig reported 11/18/20   Lequita Asal, MD  metFORMIN (GLUCOPHAGE) 500 MG tablet Take 1 tablet (500 mg total) by mouth daily with breakfast. 05/18/16   Roselee Nova, MD  metFORMIN (GLUCOPHAGE-XR) 500 MG 24 hr tablet Take 500 mg by mouth daily. 02/23/21   [provider]  mirtazapine (REMERON) 30 MG tablet Take 1 tablet (30 mg total) by mouth at bedtime. 06/02/17   Fritzi Mandes, MD  nystatin cream (MYCOSTATIN) Apply 1 application topically 2 (two) times daily as needed (yeast).  Patient not taking: No sig reported 05/13/20   [provider]   pantoprazole (PROTONIX) 20 MG tablet Take 20 mg by mouth daily before lunch.  Patient not taking: No sig reported    [provider]  phenol (CHLORASEPTIC) 1.4 % LIQD Use as directed 1 spray in the mouth or throat as needed for throat irritation / pain. 11/01/20   Vashti Hey, MD  polyvinyl alcohol (LIQUIFILM TEARS) 1.4 % ophthalmic solution Place 1 drop into both eyes as needed for dry eyes. 11/01/20   Vashti Hey, MD  sitaGLIPtin (JANUVIA) 100 MG tablet Take 100 mg by mouth every morning. Patient not taking: No sig reported    [provider]  sodium chloride (OCEAN) 0.65 % SOLN nasal spray Place 1 spray into both nostrils as needed for congestion. 11/01/20   Vashti Hey, MD  VITAMIN D PO Take 1 capsule by mouth daily. Patient not taking: No sig reported    [provider]  prochlorperazine (COMPAZINE) 10 MG tablet Take 1 tablet (10 mg total) by mouth every 6 (six) hours as needed (Nausea or vomiting). 09/08/20 12/28/20  Lequita Asal, MD    Allergies Propoxyphene  Family History  Problem Relation Age of Onset  . Diabetes Brother     Social History Social History   Tobacco Use  . Smoking status: Former Smoker    Packs/day: 1.00    Years: 40.00    Pack years: 40.00    Types: Cigars, Cigarettes    Quit date: 08/12/2019    Years since quitting: 1.5  . Smokeless tobacco: Never Used  Vaping Use  . Vaping Use: Never used  Substance Use Topics  . Alcohol use: Not Currently    Comment: H/O ETOH ABUSE BUT DENIES DRINKING DURING 08-11-20 INTERVIEW  . Drug use: No    Review of Systems  Constitutional: No fever/chills Eyes: No visual changes. ENT: No sore throat. Cardiovascular: Denies chest pain. Respiratory: Denies shortness of breath. Gastrointestinal: No abdominal pain.  No nausea, no vomiting.  No diarrhea.  No constipation. Genitourinary: Negative for dysuria. Musculoskeletal: Negative for back  pain. Skin: Negative for rash. Neurological: Negative for  focal weakness or numbness.  Positive for headache  ____________________________________________   PHYSICAL EXAM:  VITAL SIGNS: Vitals:   03/03/21 2000 03/03/21 2200  BP: (!) 146/126 (!) 165/96  Pulse: 88 81  Resp: 15 16  Temp:    SpO2: 98% 99%    Constitutional: Alert and oriented.  Obviously uncomfortable.  Clutching his head.  Follows commands in all 4 extremities and speaks in full sentences. Eyes: Conjunctivae are normal. PERRL. EOMI. Head: Atraumatic. Nose: No congestion/rhinnorhea.  Mouth/Throat: Mucous membranes are moist.  Oropharynx non-erythematous. Neck: No stridor. No cervical spine tenderness to palpation. Cardiovascular: Normal rate, regular rhythm. Grossly normal heart sounds.  Good peripheral circulation. Respiratory: Normal respiratory effort.  No retractions. Lungs CTAB. Gastrointestinal: Soft , nondistended, nontender to palpation. No CVA tenderness. Musculoskeletal: No lower extremity tenderness nor edema.  No joint effusions. No signs of acute trauma. Neurologic:  Normal speech and language. No gross focal neurologic deficits are appreciated.  Cranial nerves II through XII intact 5/5 strength and sensation in all 4 extremities Skin:  Skin is warm, dry and intact. No rash noted. Psychiatric: Mood and affect are normal. Speech and behavior are normal. ____________________________________________   LABS (all labs ordered are listed, but only abnormal results are displayed)  Labs Reviewed  BASIC METABOLIC PANEL - Abnormal; Notable for the following components:      Result Value   Glucose, Bld 184 (*)    All other components within normal limits  CBC  URINALYSIS, COMPLETE (UACMP) WITH MICROSCOPIC  CBG MONITORING, ED   ____________________________________________  12 Lead EKG  Sinus rhythm, rate of 92 bpm.  Normal axis and intervals.  No evidence of acute ischemia.  Nonspecific ST ST changes  laterally. ____________________________________________   PROCEDURES and INTERVENTIONS  Procedure(s) performed (including Critical Care):  .1-3 Lead EKG Interpretation Performed by: Vladimir Crofts, MD Authorized by: Vladimir Crofts, MD     Interpretation: normal     ECG rate:  80   ECG rate assessment: normal     Rhythm: sinus rhythm     Ectopy: none     Conduction: normal      Medications  gabapentin (NEURONTIN) capsule 400 mg (400 mg Oral Given 03/03/21 1815)  butalbital-acetaminophen-caffeine (FIORICET) 50-325-40 MG per tablet 2 tablet (2 tablets Oral Given 03/03/21 1815)  dexamethasone (DECADRON) injection 8 mg (8 mg Intravenous Given 03/03/21 1813)  HYDROmorphone (DILAUDID) injection 0.5 mg (0.5 mg Intravenous Given 03/03/21 1813)  ketorolac (TORADOL) 30 MG/ML injection 15 mg (15 mg Intravenous Given 03/03/21 1813)  acetaminophen (TYLENOL) tablet 1,000 mg (1,000 mg Oral Given 03/03/21 2119)  methocarbamol (ROBAXIN) tablet 500 mg (500 mg Oral Given 03/03/21 2119)  oxyCODONE (Oxy IR/ROXICODONE) immediate release tablet 5 mg (5 mg Oral Given 03/03/21 2119)    ____________________________________________   MDM / ED COURSE   75 year old male with history of nasopharyngeal carcinoma s/p chemotherapy and radiation presents to the ED with acute on chronic headaches with poor pain control at home, but without evidence of acute pathology, and amenable to continued outpatient management.  Normal vitals on room air.  Exam without neurovascular deficits, distress or any signs of trauma.  He appears quite uncomfortable upon presentation, but after multimodal pain control, he appears well and is sitting up in bed tolerating p.o. intake and at his baseline.  Blood work is unremarkable.  I review many different ED visits and neurology outpatient visit that have occurred in the past week.  He has had many CT scans in this timeframe, and without any trauma, neurovascular deficits or additional features I see no  indication to repeat imaging at this time.  Provided patient mixture of oral and IV multimodal pain control with good control of his pain.  I am concerned that he has poor insight into his medications at home and that his symptoms are due to poor understanding of multimodal pain control and how to appropriately take his medications at home.  We very thoroughly discussed this on multiple occasions.  I write down  an appropriate nonnarcotic multimodal regimen for the patient to take at home.  He is well connected as an outpatient and I see no barriers to outpatient management.  We discussed return precautions for the ED and patient stable for outpatient management.   Clinical Course as of 03/03/21 2322  Wed Mar 03, 2021  1931 Reassessed.  Patient sitting up in bed, looks clinically well and is eating a sandwich meal tray.  He reports feeling better.  We discussed the importance of multimodal pain control at home.  He reports he is uncertain of the medications that he has at home that he uses for his headaches.  He reports that he is going to call his sister, who should be able to go to his house and find out what these medications are.  We discussed the likelihood of outpatient management, and he is agreeable as long as his pain is controlled. [DS]  2034 Reassessed.  Patient continues to look well.  We again discussed multimodal pain management at home.  We discussed discharge and he is agreeable. [DS]  2124 Reassessed.  Continues to have controlled pain.  We again discussed multimodal pain control at home  [DS]    Clinical Course User Index [DS] Vladimir Crofts, MD    ____________________________________________   FINAL CLINICAL IMPRESSION(S) / ED DIAGNOSES  Final diagnoses:  Fall, initial encounter  Acute nonintractable headache, unspecified headache type  Inadequate pain control     ED Discharge Orders         Ordered    methocarbamol (ROBAXIN) 500 MG tablet  Every 6 hours PRN         03/03/21 2149           Arloa Prak   Note:  This document was prepared using Dragon voice recognition software and may include unintentional dictation errors.   Vladimir Crofts, MD 03/03/21 2325

## 2021-03-03 NOTE — ED Notes (Signed)
Late entry - Assumed care of pt at 1900, denies pain, NV. Pt ate entirety of sandwich and chips. Water provided. Pt brother at bedside. DC reviewed with pt and brother. Pt wheeled out of ED by assisting RN. AO x4. Breathing regular and unlabored

## 2021-03-03 NOTE — ED Notes (Signed)
Patient eating sandwich tray at this time.

## 2021-03-03 NOTE — ED Triage Notes (Signed)
Pt to ER via [facility] with complaints of generalized weakness that started x1 week ago, states he has also been having involuntary muscle spasms that have been causing him to fall. States today he was opening the refrigerator door when his leg "jerked" and he fell. Denies LOC or hitting head.   EMS VSS.

## 2021-03-03 NOTE — ED Notes (Signed)
Patient states he's been seen in ER (at multiple places) 8 times in the past week for headache. Patient states he gets relief for a short period of time but then the pain comes back.

## 2021-03-03 NOTE — ED Notes (Signed)
Pt comes via EMS from home with c/o fall. Pt states bilateral leg pain. VSS

## 2021-03-03 NOTE — Discharge Instructions (Addendum)
Use Tylenol for pain and fevers.  Up to 1000 mg per dose, up to 4 times per day.  Do not take more than 4000 mg of Tylenol/acetaminophen within 24 hours..  Use Gabapentin as prescribed by your Neurologist: 300mg  in the morning, 600mg  at night.   Use the Depakote 250mg  at night only for your pain. This is the 2nd medicine your neurologist provided.   Use Aleve/naproxen for pain. Buy this over the counter at any pharmacy. 500mg  every 12 hours. Don't take more frequently than this.  Robaxin muscle relaxer for severe/breakthrough pain. Use this medicine up to 3-4 times per day, 500mg  (1 pill) per dose. This medicine is waiting for you at the [facility].   Use all of these medicines in combination with each other to help with your pain.   Follow up with your PCP in the next 2 days. Return to the ED with any fevers with your pain, or further uncontrolled pain.

## 2021-03-04 ENCOUNTER — Telehealth: Payer: Self-pay | Admitting: *Deleted

## 2021-03-04 NOTE — Telephone Encounter (Signed)
Tina patient step daughter called reporting that patient has been to ER 8 times in past 7 days with a sever headache and that they cannot seem to find the reason for it. She reports that he has had several scans and xrays. He has been referred to a Neurologist at [facility] and he has an appointment today with Dr Kathyrn Sheriff to see if he can figure it out. She is asking if Dr Mike Gip can provide any insight for this and requests a return call from Dr Mike Gip  [phone].  Of note, this patient has been to 3 different ERs, [facility], [place], and [facility]

## 2021-03-05 ENCOUNTER — Telehealth: Payer: Self-pay | Admitting: Hematology and Oncology

## 2021-03-05 NOTE — Telephone Encounter (Signed)
Dr. Corcoran please advise  

## 2021-03-05 NOTE — Telephone Encounter (Signed)
Re:  Headaches  I spoke with the patient's daughter about multiple ER visits in the past week for intractable headaches.  Head MRI will need to be repeated secondary to motion artifact.  He is now getting some relief with steroids.  He will be referred to Dr Manuella Ghazi and possibly Dr Izora Ribas.   Lequita Asal, MD

## 2021-03-05 NOTE — Telephone Encounter (Signed)
  Spoke to daughter earlier today.  Note in chart.  M

## 2021-03-06 ENCOUNTER — Other Ambulatory Visit: Payer: Self-pay

## 2021-03-06 ENCOUNTER — Observation Stay: Payer: Medicare Other

## 2021-03-06 ENCOUNTER — Inpatient Hospital Stay
Admission: EM | Admit: 2021-03-06 | Discharge: 2021-03-14 | DRG: 073 | Disposition: A | Discharge status: Other Acute Inpatient Hospital | Payer: Medicare Other | Attending: Family Medicine | Admitting: Family Medicine

## 2021-03-06 DIAGNOSIS — Z79899 Other long term (current) drug therapy: Secondary | ICD-10-CM

## 2021-03-06 DIAGNOSIS — K219 Gastro-esophageal reflux disease without esophagitis: Secondary | ICD-10-CM | POA: Diagnosis present

## 2021-03-06 DIAGNOSIS — I639 Cerebral infarction, unspecified: Secondary | ICD-10-CM | POA: Diagnosis present

## 2021-03-06 DIAGNOSIS — Z888 Allergy status to other drugs, medicaments and biological substances status: Secondary | ICD-10-CM

## 2021-03-06 DIAGNOSIS — Z7984 Long term (current) use of oral hypoglycemic drugs: Secondary | ICD-10-CM

## 2021-03-06 DIAGNOSIS — F1010 Alcohol abuse, uncomplicated: Secondary | ICD-10-CM | POA: Diagnosis present

## 2021-03-06 DIAGNOSIS — J449 Chronic obstructive pulmonary disease, unspecified: Secondary | ICD-10-CM | POA: Diagnosis present

## 2021-03-06 DIAGNOSIS — Z9221 Personal history of antineoplastic chemotherapy: Secondary | ICD-10-CM

## 2021-03-06 DIAGNOSIS — Z87891 Personal history of nicotine dependence: Secondary | ICD-10-CM

## 2021-03-06 DIAGNOSIS — R519 Headache, unspecified: Secondary | ICD-10-CM | POA: Diagnosis not present

## 2021-03-06 DIAGNOSIS — Z7902 Long term (current) use of antithrombotics/antiplatelets: Secondary | ICD-10-CM

## 2021-03-06 DIAGNOSIS — G500 Trigeminal neuralgia: Principal | ICD-10-CM | POA: Diagnosis present

## 2021-03-06 DIAGNOSIS — R400 Somnolence: Secondary | ICD-10-CM | POA: Diagnosis present

## 2021-03-06 DIAGNOSIS — Z923 Personal history of irradiation: Secondary | ICD-10-CM

## 2021-03-06 DIAGNOSIS — F32A Depression, unspecified: Secondary | ICD-10-CM

## 2021-03-06 DIAGNOSIS — F419 Anxiety disorder, unspecified: Secondary | ICD-10-CM | POA: Diagnosis not present

## 2021-03-06 DIAGNOSIS — Z833 Family history of diabetes mellitus: Secondary | ICD-10-CM

## 2021-03-06 DIAGNOSIS — C119 Malignant neoplasm of nasopharynx, unspecified: Secondary | ICD-10-CM | POA: Diagnosis present

## 2021-03-06 DIAGNOSIS — F418 Other specified anxiety disorders: Secondary | ICD-10-CM | POA: Diagnosis present

## 2021-03-06 DIAGNOSIS — Z85828 Personal history of other malignant neoplasm of skin: Secondary | ICD-10-CM

## 2021-03-06 DIAGNOSIS — E43 Unspecified severe protein-calorie malnutrition: Secondary | ICD-10-CM | POA: Diagnosis present

## 2021-03-06 DIAGNOSIS — Z66 Do not resuscitate: Secondary | ICD-10-CM | POA: Diagnosis present

## 2021-03-06 DIAGNOSIS — Z681 Body mass index (BMI) 19 or less, adult: Secondary | ICD-10-CM

## 2021-03-06 DIAGNOSIS — E119 Type 2 diabetes mellitus without complications: Secondary | ICD-10-CM | POA: Diagnosis not present

## 2021-03-06 DIAGNOSIS — R1310 Dysphagia, unspecified: Secondary | ICD-10-CM

## 2021-03-06 DIAGNOSIS — M316 Other giant cell arteritis: Secondary | ICD-10-CM

## 2021-03-06 DIAGNOSIS — I10 Essential (primary) hypertension: Secondary | ICD-10-CM | POA: Diagnosis present

## 2021-03-06 DIAGNOSIS — E785 Hyperlipidemia, unspecified: Secondary | ICD-10-CM | POA: Diagnosis present

## 2021-03-06 DIAGNOSIS — Z20822 Contact with and (suspected) exposure to covid-19: Secondary | ICD-10-CM | POA: Diagnosis present

## 2021-03-06 DIAGNOSIS — Z8673 Personal history of transient ischemic attack (TIA), and cerebral infarction without residual deficits: Secondary | ICD-10-CM

## 2021-03-06 DIAGNOSIS — Z7982 Long term (current) use of aspirin: Secondary | ICD-10-CM

## 2021-03-06 DIAGNOSIS — Z515 Encounter for palliative care: Secondary | ICD-10-CM

## 2021-03-06 LAB — CBC
HCT: 34.7 % — ABNORMAL LOW (ref 39.0–52.0)
Hemoglobin: 11.2 g/dL — ABNORMAL LOW (ref 13.0–17.0)
MCH: 29.5 pg (ref 26.0–34.0)
MCHC: 32.3 g/dL (ref 30.0–36.0)
MCV: 91.3 fL (ref 80.0–100.0)
Platelets: 326 10*3/uL (ref 150–400)
RBC: 3.8 MIL/uL — ABNORMAL LOW (ref 4.22–5.81)
RDW: 13.5 % (ref 11.5–15.5)
WBC: 11.8 10*3/uL — ABNORMAL HIGH (ref 4.0–10.5)
nRBC: 0 % (ref 0.0–0.2)

## 2021-03-06 LAB — BASIC METABOLIC PANEL
Anion gap: 9 (ref 5–15)
BUN: 26 mg/dL — ABNORMAL HIGH (ref 8–23)
CO2: 28 mmol/L (ref 22–32)
Calcium: 9.8 mg/dL (ref 8.9–10.3)
Chloride: 96 mmol/L — ABNORMAL LOW (ref 98–111)
Creatinine, Ser: 0.91 mg/dL (ref 0.61–1.24)
GFR, Estimated: >60 mL/min (ref >60)
Glucose, Bld: 251 mg/dL — ABNORMAL HIGH (ref 70–99)
Potassium: 4.9 mmol/L (ref 3.5–5.1)
Sodium: 133 mmol/L — ABNORMAL LOW (ref 135–145)

## 2021-03-06 LAB — PROTIME-INR
INR: 1 (ref 0.8–1.2)
Prothrombin Time: 12.5 seconds (ref 11.4–15.2)

## 2021-03-06 LAB — SARS CORONAVIRUS 2 (TAT 6-24 HRS): SARS Coronavirus 2: NEGATIVE

## 2021-03-06 LAB — GLUCOSE, CAPILLARY
Glucose-Capillary: 241 mg/dL — ABNORMAL HIGH (ref 70–99)
Glucose-Capillary: 278 mg/dL — ABNORMAL HIGH (ref 70–99)
Glucose-Capillary: 134 mg/dL — ABNORMAL HIGH (ref 70–99)

## 2021-03-06 LAB — SEDIMENTATION RATE: Sed Rate: 78 mm/hr — ABNORMAL HIGH (ref 0–20)

## 2021-03-06 LAB — C-REACTIVE PROTEIN: CRP: 2.1 mg/dL — ABNORMAL HIGH (ref <1.0)

## 2021-03-06 MED ORDER — ASPIRIN EC 81 MG PO TBEC
81.0000 mg | DELAYED_RELEASE_TABLET | Freq: Every day | ORAL | Status: DC
  Administered 2021-03-06 – 2021-03-11 (×6): 81 mg via ORAL
  Filled 2021-03-06 (×6): qty 1

## 2021-03-06 MED ORDER — SENNOSIDES-DOCUSATE SODIUM 8.6-50 MG PO TABS
1.0000 | ORAL_TABLET | Freq: Every evening | ORAL | Status: DC | PRN
  Administered 2021-03-06 – 2021-03-08 (×2): 1 via ORAL
  Filled 2021-03-06 (×2): qty 1

## 2021-03-06 MED ORDER — ALBUTEROL SULFATE HFA 108 (90 BASE) MCG/ACT IN AERS
2.0000 | INHALATION_SPRAY | RESPIRATORY_TRACT | Status: DC | PRN
  Filled 2021-03-06: qty 6.7

## 2021-03-06 MED ORDER — DIVALPROEX SODIUM ER 250 MG PO TB24
250.0000 mg | ORAL_TABLET | Freq: Every day | ORAL | Status: AC
  Administered 2021-03-06 – 2021-03-12 (×7): 250 mg via ORAL
  Filled 2021-03-06 (×7): qty 1

## 2021-03-06 MED ORDER — DIVALPROEX SODIUM ER 500 MG PO TB24: 750.0000 mg | ORAL_TABLET | Freq: Every day | ORAL | Status: DC

## 2021-03-06 MED ORDER — HYDROMORPHONE HCL 1 MG/ML IJ SOLN
1.0000 mg | Freq: Once | INTRAMUSCULAR | Status: AC
  Administered 2021-03-06: 1 mg via INTRAVENOUS
  Filled 2021-03-06: qty 1

## 2021-03-06 MED ORDER — METHOCARBAMOL 500 MG PO TABS
500.0000 mg | ORAL_TABLET | Freq: Four times a day (QID) | ORAL | Status: DC | PRN
  Administered 2021-03-07 – 2021-03-11 (×6): 500 mg via ORAL
  Filled 2021-03-06 (×8): qty 1

## 2021-03-06 MED ORDER — SUMATRIPTAN SUCCINATE 50 MG PO TABS
25.0000 mg | ORAL_TABLET | Freq: Two times a day (BID) | ORAL | Status: DC | PRN
  Administered 2021-03-06: 23:00:00 25 mg via ORAL
  Filled 2021-03-06 (×3): qty 1

## 2021-03-06 MED ORDER — POLYVINYL ALCOHOL 1.4 % OP SOLN
1.0000 [drp] | OPHTHALMIC | Status: DC | PRN
  Filled 2021-03-06: qty 15

## 2021-03-06 MED ORDER — MORPHINE SULFATE (PF) 2 MG/ML IV SOLN
2.0000 mg | INTRAVENOUS | Status: DC | PRN
  Administered 2021-03-07 (×3): 2 mg via INTRAVENOUS
  Filled 2021-03-06 (×3): qty 1

## 2021-03-06 MED ORDER — INSULIN ASPART 100 UNIT/ML ~~LOC~~ SOLN
0.0000 [IU] | Freq: Every day | SUBCUTANEOUS | Status: DC
  Administered 2021-03-07 – 2021-03-09 (×3): 2 [IU] via SUBCUTANEOUS
  Filled 2021-03-06 (×3): qty 1

## 2021-03-06 MED ORDER — ALPRAZOLAM 0.25 MG PO TABS
0.2500 mg | ORAL_TABLET | Freq: Every day | ORAL | Status: DC | PRN
  Administered 2021-03-07: 0.25 mg via ORAL
  Filled 2021-03-06: qty 1

## 2021-03-06 MED ORDER — KETOROLAC TROMETHAMINE 30 MG/ML IJ SOLN
15.0000 mg | Freq: Once | INTRAMUSCULAR | Status: AC
  Administered 2021-03-06: 15 mg via INTRAVENOUS
  Filled 2021-03-06: qty 1

## 2021-03-06 MED ORDER — DM-GUAIFENESIN ER 30-600 MG PO TB12
1.0000 | ORAL_TABLET | Freq: Two times a day (BID) | ORAL | Status: DC | PRN
  Administered 2021-03-10 – 2021-03-11 (×3): 1 via ORAL
  Filled 2021-03-06 (×3): qty 1

## 2021-03-06 MED ORDER — ENSURE ENLIVE PO LIQD
237.0000 mL | Freq: Three times a day (TID) | ORAL | Status: DC
  Administered 2021-03-06 – 2021-03-13 (×13): 237 mL via ORAL

## 2021-03-06 MED ORDER — LOSARTAN POTASSIUM 50 MG PO TABS
100.0000 mg | ORAL_TABLET | Freq: Every day | ORAL | Status: DC
  Administered 2021-03-06 – 2021-03-13 (×7): 100 mg via ORAL
  Filled 2021-03-06 (×8): qty 2

## 2021-03-06 MED ORDER — GADOBUTROL 1 MMOL/ML IV SOLN
6.0000 mL | Freq: Once | INTRAVENOUS | Status: AC | PRN
  Administered 2021-03-06: 14:00:00 6 mL via INTRAVENOUS

## 2021-03-06 MED ORDER — DIVALPROEX SODIUM ER 250 MG PO TB24: 250.0000 mg | ORAL_TABLET | ORAL | Status: DC

## 2021-03-06 MED ORDER — MORPHINE SULFATE (PF) 2 MG/ML IV SOLN: 2.0000 mg | INTRAVENOUS | Status: DC | PRN

## 2021-03-06 MED ORDER — DEXAMETHASONE SODIUM PHOSPHATE 10 MG/ML IJ SOLN: 10.0000 mg | Freq: Once | INTRAMUSCULAR | Status: DC

## 2021-03-06 MED ORDER — ESCITALOPRAM OXALATE 10 MG PO TABS
20.0000 mg | ORAL_TABLET | Freq: Every day | ORAL | Status: DC
  Administered 2021-03-06 – 2021-03-13 (×7): 20 mg via ORAL
  Filled 2021-03-06 (×9): qty 2

## 2021-03-06 MED ORDER — HEPARIN SODIUM (PORCINE) 5000 UNIT/ML IJ SOLN
5000.0000 [IU] | Freq: Three times a day (TID) | INTRAMUSCULAR | Status: DC
  Administered 2021-03-06 – 2021-03-14 (×22): 5000 [IU] via SUBCUTANEOUS
  Filled 2021-03-06 (×22): qty 1

## 2021-03-06 MED ORDER — HYDRALAZINE HCL 20 MG/ML IJ SOLN: 5.0000 mg | INTRAMUSCULAR | Status: DC | PRN

## 2021-03-06 MED ORDER — MIRTAZAPINE 15 MG PO TABS
15.0000 mg | ORAL_TABLET | Freq: Every evening | ORAL | Status: DC | PRN
  Administered 2021-03-06 – 2021-03-12 (×5): 15 mg via ORAL
  Filled 2021-03-06 (×6): qty 1

## 2021-03-06 MED ORDER — CLOPIDOGREL BISULFATE 75 MG PO TABS
75.0000 mg | ORAL_TABLET | Freq: Every day | ORAL | Status: DC
  Administered 2021-03-06 – 2021-03-11 (×6): 75 mg via ORAL
  Filled 2021-03-06 (×6): qty 1

## 2021-03-06 MED ORDER — ALUM & MAG HYDROXIDE-SIMETH 200-200-20 MG/5ML PO SUSP
30.0000 mL | Freq: Four times a day (QID) | ORAL | Status: DC | PRN
  Administered 2021-03-06: 30 mL via ORAL
  Filled 2021-03-06: qty 30

## 2021-03-06 MED ORDER — ACETAMINOPHEN 325 MG PO TABS
650.0000 mg | ORAL_TABLET | Freq: Four times a day (QID) | ORAL | Status: DC | PRN
  Administered 2021-03-06 – 2021-03-11 (×3): 650 mg via ORAL
  Filled 2021-03-06 (×3): qty 2

## 2021-03-06 MED ORDER — PREDNISONE 50 MG PO TABS
50.0000 mg | ORAL_TABLET | Freq: Every day | ORAL | Status: DC
  Administered 2021-03-07 – 2021-03-09 (×3): 50 mg via ORAL
  Filled 2021-03-06 (×3): qty 1

## 2021-03-06 MED ORDER — GABAPENTIN 300 MG PO CAPS: 300.0000 mg | ORAL_CAPSULE | Freq: Two times a day (BID) | ORAL | Status: DC

## 2021-03-06 MED ORDER — BUSPIRONE HCL 10 MG PO TABS
10.0000 mg | ORAL_TABLET | Freq: Every day | ORAL | Status: DC
  Administered 2021-03-06 – 2021-03-12 (×7): 10 mg via ORAL
  Filled 2021-03-06 (×8): qty 1

## 2021-03-06 MED ORDER — FAMOTIDINE 20 MG PO TABS
40.0000 mg | ORAL_TABLET | Freq: Every evening | ORAL | Status: DC
  Administered 2021-03-06 – 2021-03-11 (×6): 40 mg via ORAL
  Filled 2021-03-06 (×7): qty 2

## 2021-03-06 MED ORDER — GABAPENTIN 300 MG PO CAPS
600.0000 mg | ORAL_CAPSULE | Freq: Every day | ORAL | Status: DC
  Administered 2021-03-06 – 2021-03-13 (×8): 600 mg via ORAL
  Filled 2021-03-06 (×8): qty 2

## 2021-03-06 MED ORDER — ONDANSETRON HCL 4 MG/2ML IJ SOLN
4.0000 mg | Freq: Three times a day (TID) | INTRAMUSCULAR | Status: DC | PRN
  Administered 2021-03-12: 04:00:00 4 mg via INTRAVENOUS
  Filled 2021-03-06: qty 2

## 2021-03-06 MED ORDER — OXYCODONE-ACETAMINOPHEN 5-325 MG PO TABS
1.0000 | ORAL_TABLET | ORAL | Status: DC | PRN
  Administered 2021-03-06 – 2021-03-07 (×3): 1 via ORAL
  Filled 2021-03-06 (×3): qty 1

## 2021-03-06 MED ORDER — ATORVASTATIN CALCIUM 20 MG PO TABS
80.0000 mg | ORAL_TABLET | Freq: Every evening | ORAL | Status: DC
  Administered 2021-03-06 – 2021-03-09 (×4): 80 mg via ORAL
  Filled 2021-03-06 (×5): qty 4

## 2021-03-06 MED ORDER — DEXAMETHASONE SODIUM PHOSPHATE 10 MG/ML IJ SOLN
10.0000 mg | Freq: Once | INTRAMUSCULAR | Status: AC
  Administered 2021-03-06: 10 mg via INTRAVENOUS
  Filled 2021-03-06: qty 1

## 2021-03-06 MED ORDER — INSULIN ASPART 100 UNIT/ML ~~LOC~~ SOLN
0.0000 [IU] | Freq: Three times a day (TID) | SUBCUTANEOUS | Status: DC
  Administered 2021-03-06: 13:00:00 3 [IU] via SUBCUTANEOUS
  Administered 2021-03-06 – 2021-03-07 (×2): 5 [IU] via SUBCUTANEOUS
  Administered 2021-03-07: 3 [IU] via SUBCUTANEOUS
  Administered 2021-03-07: 1 [IU] via SUBCUTANEOUS
  Administered 2021-03-08: 08:00:00 2 [IU] via SUBCUTANEOUS
  Administered 2021-03-08: 12:00:00 3 [IU] via SUBCUTANEOUS
  Administered 2021-03-08: 5 [IU] via SUBCUTANEOUS
  Administered 2021-03-09: 13:00:00 3 [IU] via SUBCUTANEOUS
  Administered 2021-03-09: 08:00:00 1 [IU] via SUBCUTANEOUS
  Administered 2021-03-09: 3 [IU] via SUBCUTANEOUS
  Administered 2021-03-10: 09:00:00 2 [IU] via SUBCUTANEOUS
  Administered 2021-03-10: 17:00:00 1 [IU] via SUBCUTANEOUS
  Administered 2021-03-10 – 2021-03-11 (×4): 2 [IU] via SUBCUTANEOUS
  Administered 2021-03-12: 3 [IU] via SUBCUTANEOUS
  Administered 2021-03-12 – 2021-03-13 (×2): 2 [IU] via SUBCUTANEOUS
  Administered 2021-03-13: 1 [IU] via SUBCUTANEOUS
  Administered 2021-03-13: 2 [IU] via SUBCUTANEOUS
  Filled 2021-03-06 (×20): qty 1

## 2021-03-06 MED ORDER — DIVALPROEX SODIUM ER 500 MG PO TB24
500.0000 mg | ORAL_TABLET | Freq: Every day | ORAL | Status: DC
  Administered 2021-03-13: 500 mg via ORAL
  Filled 2021-03-06 (×2): qty 1

## 2021-03-06 MED ORDER — GABAPENTIN 300 MG PO CAPS
300.0000 mg | ORAL_CAPSULE | Freq: Every day | ORAL | Status: DC
  Administered 2021-03-13: 300 mg via ORAL
  Filled 2021-03-06: qty 1

## 2021-03-06 NOTE — ED Notes (Signed)
Pt hitting call bell requesting nurse, secretary called first nurse to inform.  Upon hanging up phone rang immediately again with secretary stating that patient is at her desk requesting nurse and getting upset.

## 2021-03-06 NOTE — ED Triage Notes (Signed)
Headache x10 days. Recently diagnosed with trigeminal neuralgia and has not yet been able to get in to the neurologist. Reports pain has not changed or worsened it is "just unbearable" and is getting no relief at home with prescribed meds.

## 2021-03-06 NOTE — ED Notes (Signed)
Patient re-directed to waiting area again and educated regarding process for treatment. Patient assisted into recliner to promote comfort and provided with 2 warm blankets, 1 which was wrapped around patient's shoulders, ant the other placed on top of patient. Patient also provided with ice pack to apply to head in effort to ease pain, and ice chips due to reported dry mouth. Patient handed call bell and instructed on use.

## 2021-03-06 NOTE — H&P (Addendum)
History and Physical    Kenneth Franklin EEF:[id number] DOB: Apr 12, 1946 DOA: 03/06/2021  Referring MD/NP/PA:   PCP: Verita Lamb, NP   Patient coming from:  The patient is coming from home.  At baseline, pt is independent for most of ADL.        Chief Complaint: Intractable headache  HPI: Kenneth Franklin is a 75 y.o. 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   Pt has chronic intractable headaches.  He has had at least 8 ED visits since 02/24/2021.  He has right-sided severe sharp intractable headache, radiating to his right face and right ear area. Headache is associated with photophobia sometime.  Pt was seen by Dr. Orlie Dakin of [facility] on 03/02/2021 who increased his gabapentin to 300 mg in the morning and 600 mg at bedtime, and started him on Depakote ER 250 mg and increase to 750 mg over 3 weeks. Per Dr. Casimer Leek note, "The etiology of this headache is unclear. He does not meet diagnostic criteria for migraine, tension, or cluster headache. The differential includes atypical facial pain (perineural spread along the right V2 region, poorly evaluated due to motion artifact), cervicogenic headache, hemicrania continua, versus primary stabbing headache". Dr. Orlie Dakin is planning to do GCA scan to better exclude giant cell arteritis.   Patient has had multiple CT of head and also had MRI of brain on 02/26/2021. He had CRP 75 on 02/26/21.  Patient denies unilateral numbness or tingling in extremities.  No facial droop or slurred speech.  Denies chest pain, cough, shortness breath.  No fever or chills.  No nausea, vomiting, diarrhea or abdominal pain.  No symptoms of UTI.  ED Course: pt was found to have WBC 8.0 on 4/6, pending COVID-19 PCR, electrolytes renal function okay on 4/6, pending CBC and BMP. Temperature  normal, blood pressure 113/59, heart rate 60, RR 20, oxygen saturation 93 to 100% on room air.  Patient is placed on MedSurg bed for observation, Dr. Cheral Marker of neurology is consulted.   MRI-brain on 02/26/21: Nasopharyngeal malignancy which is grossly similar in extent with perineural spread along the right V2 nerve which is poorly evaluated due to extensive motion artifact. Recommend repeat exam with sedation or anesthesia if necessary.   Review of Systems:   General: no fevers, chills, no body weight gain, has fatigue HEENT: no blurry vision, hearing changes or sore throat. Has headache Respiratory: no dyspnea, coughing, wheezing CV: no chest pain, no palpitations GI: no nausea, vomiting, abdominal pain, diarrhea, constipation GU: no dysuria, burning on urination, increased urinary frequency, hematuria  Ext: no leg edema Neuro: no unilateral weakness, numbness, or tingling, no vision change or hearing loss Skin: no rash, no skin tear. MSK: No muscle spasm, no deformity, no limitation of range of movement in spin Heme: No easy bruising.  Travel history: No recent long distant travel.  Allergy:  Allergies  Allergen Reactions  . Propoxyphene     Unknown reaction    Past Medical History:  Diagnosis Date  . Acute ischemic stroke ([place]) 2017  . Alcohol abuse   . Anxiety   . Cancer ([place])   . Depression   . Diabetes mellitus without complication ([place])   . Dyspnea    pcp knows and ordered rescue inhaler  . Hyperlipidemia   . Hypertension   . Skin cancer    Squamous  Cell Carcinoma In Situ    Past Surgical History:  Procedure Laterality Date  . CATARACT EXTRACTION Bilateral   . COLONOSCOPY    . NASOPHARYNGOSCOPY N/A 08/12/2020   Procedure: ENDOSCOPIC NASOPHARYNGOSCOPY WITH BIOPSY;  Surgeon: Margaretha Sheffield, MD;  Location: [facility] ORS;  Service: ENT;  Laterality: N/A;  . PORTA CATH INSERTION N/A 08/24/2020   Procedure: PORTA CATH INSERTION;  Surgeon: Algernon Huxley, MD;  Location: [facility];  Service: Cardiovascular;  Laterality: N/A;    Social History:  reports that he quit smoking about 18 months ago. His smoking use included cigars and cigarettes. He has a 40.00 pack-year smoking history. He has never used smokeless tobacco. He reports previous alcohol use. He reports that he does not use drugs.  Family History:  Family History  Problem Relation Age of Onset  . Diabetes Brother      Prior to Admission medications   Medication Sig Start Date End Date Taking? Authorizing Provider  ALPRAZolam (XANAX) 0.25 MG tablet TAKE 1 TABLET EVERY DAY AS NEEDED FOR ANXIETY Patient taking differently: Take 0.25 mg by mouth daily as needed for anxiety. 10/26/16   Roselee Nova, MD  aspirin EC 81 MG tablet Take 81 mg by mouth daily. Swallow whole.    [provider]  atorvastatin (LIPITOR) 80 MG tablet TAKE 1 TABLET (80 MG TOTAL) BY MOUTH NIGHTLY. Patient taking differently: Take 80 mg by mouth every evening. 11/14/16   Roselee Nova, MD  busPIRone (BUSPAR) 10 MG tablet TAKE 1 TABLET (10 MG TOTAL) BY MOUTH 2 (TWO) TIMES DAILY. Patient taking differently: Take 10 mg by mouth at bedtime. 08/16/16   Roselee Nova, MD  clopidogrel (PLAVIX) 75 MG tablet TAKE 1 TABLET (75 MG TOTAL) BY MOUTH DAILY. 07/25/16   Roselee Nova, MD  DENTA 5000 PLUS 1.1 % CREA dental cream Place 1 application onto teeth at bedtime.  03/18/20   [provider]  escitalopram (LEXAPRO) 20 MG tablet Take 20 mg by mouth every morning.     [provider]  famotidine (PEPCID) 40 MG tablet Take 1 tablet (40 mg total) by mouth every evening. 02/24/21 02/24/22  Naaman Plummer, MD  feeding supplement (ENSURE ENLIVE / ENSURE PLUS) LIQD Take 237 mLs by mouth 3 (three) times daily between meals. 11/01/20   Vashti Hey, MD  fluticasone (FLOVENT HFA) 110 MCG/ACT inhaler Inhale 2 puffs into the lungs daily as needed (shortness of breath).    [provider]   glipiZIDE (GLUCOTROL) 5 MG tablet Take 5 mg by mouth daily before breakfast.     [provider]  HYDROcodone-acetaminophen (NORCO) 5-325 MG tablet Take 1 tablet by mouth every 6 (six) hours as needed for moderate pain. 02/24/21   Lequita Asal, MD  lidocaine (XYLOCAINE) 2 % solution USE AS DIRECTED 5 MLS IN THE MOUTH OR THROAT EVERY 6 (SIX) HOURS AS NEEDED FOR MOUTH PAIN. Patient not taking: No sig reported 11/13/20   Sindy Guadeloupe, MD  lidocaine-prilocaine (EMLA) cream Apply 1 application topically as needed. Apply small amount to port site at least 1 hour prior to it being accessed, cover with plastic wrap 08/24/20   Lequita Asal, MD  losartan (COZAAR) 100 MG tablet TAKE 1 TABLET (100 MG TOTAL) BY MOUTH DAILY. Patient taking differently: Take 100 mg by mouth every morning. 08/16/16   Roselee Nova, MD  magic mouthwash w/lidocaine SOLN Take 5 mLs by mouth  4 (four) times daily as needed for mouth pain. Sig: Swish/Spit 5-10 ml four times a day as needed. Dispense 480 ml. 1RF Patient not taking: No sig reported 11/12/20   Verlon Au, NP  magnesium oxide (MAG-OX) 400 MG tablet TAKE 1 TABLET BY MOUTH EVERY DAY Patient not taking: No sig reported 11/18/20   Lequita Asal, MD  metFORMIN (GLUCOPHAGE) 500 MG tablet Take 1 tablet (500 mg total) by mouth daily with breakfast. 05/18/16   Roselee Nova, MD  metFORMIN (GLUCOPHAGE-XR) 500 MG 24 hr tablet Take 500 mg by mouth daily. 02/23/21   [provider]  methocarbamol (ROBAXIN) 500 MG tablet Take 1 tablet (500 mg total) by mouth every 6 (six) hours as needed for muscle spasms (breakthrough pain). 03/03/21   Vladimir Crofts, MD  mirtazapine (REMERON) 30 MG tablet Take 1 tablet (30 mg total) by mouth at bedtime. 06/02/17   Fritzi Mandes, MD  nystatin cream (MYCOSTATIN) Apply 1 application topically 2 (two) times daily as needed (yeast).  Patient not taking: No sig reported 05/13/20   [provider]  pantoprazole  (PROTONIX) 20 MG tablet Take 20 mg by mouth daily before lunch.  Patient not taking: No sig reported    [provider]  phenol (CHLORASEPTIC) 1.4 % LIQD Use as directed 1 spray in the mouth or throat as needed for throat irritation / pain. 11/01/20   Vashti Hey, MD  polyvinyl alcohol (LIQUIFILM TEARS) 1.4 % ophthalmic solution Place 1 drop into both eyes as needed for dry eyes. 11/01/20   Vashti Hey, MD  sitaGLIPtin (JANUVIA) 100 MG tablet Take 100 mg by mouth every morning. Patient not taking: No sig reported    [provider]  sodium chloride (OCEAN) 0.65 % SOLN nasal spray Place 1 spray into both nostrils as needed for congestion. 11/01/20   Vashti Hey, MD  VITAMIN D PO Take 1 capsule by mouth daily. Patient not taking: No sig reported    [provider]  prochlorperazine (COMPAZINE) 10 MG tablet Take 1 tablet (10 mg total) by mouth every 6 (six) hours as needed (Nausea or vomiting). 09/08/20 12/28/20  Lequita Asal, MD    Physical Exam: Vitals:   03/06/21 0700 03/06/21 0800 03/06/21 0930 03/06/21 1021  BP: (!) 113/59 126/67 127/73 (!) 148/69  Pulse: 60 61 60 68  Resp:   15 16  Temp:   98.2 F (36.8 C) 98 F (36.7 C)  TempSrc:   Oral Oral  SpO2: 93% 94% 96% 98%  Weight:      Height:       General: Not in acute distress HEENT:       Eyes: PERRL, EOMI, no scleral icterus.       ENT: No discharge from the ears and nose, no pharynx injection, no tonsillar enlargement.        Neck: No JVD, no bruit, no mass felt. Heme: No neck lymph node enlargement. Cardiac: S1/S2, RRR, No murmurs, No gallops or rubs. Respiratory: No rales, wheezing, rhonchi or rubs. GI: Soft, nondistended, nontender, no rebound pain, no organomegaly, BS present. GU: No hematuria Ext: No pitting leg edema bilaterally. 1+DP/PT pulse bilaterally. Musculoskeletal: No joint deformities, No joint redness or warmth, no limitation of ROM in  spin. Skin: No rashes.  Neuro: Alert, oriented X3, cranial nerves II-XII grossly intact, moves all extremities normally Psych: Patient is not psychotic, no suicidal or hemocidal ideation.  Labs on Admission: I have personally reviewed  following labs and imaging studies  CBC: Recent Labs  Lab 03/03/21 1154  WBC 8.0  HGB 13.1  HCT 40.5  MCV 91.8  PLT 557   Basic Metabolic Panel: Recent Labs  Lab 03/03/21 1154  NA 139  K 4.4  CL 102  CO2 28  GLUCOSE 184*  BUN 15  CREATININE 0.78  CALCIUM 9.9   GFR: Estimated Creatinine Clearance: 72.3 mL/min (by C-G formula based on SCr of 0.78 mg/dL). Liver Function Tests: No results for input(s): AST, ALT, ALKPHOS, BILITOT, PROT, ALBUMIN in the last 168 hours. No results for input(s): LIPASE, AMYLASE in the last 168 hours. No results for input(s): AMMONIA in the last 168 hours. Coagulation Profile: No results for input(s): INR, PROTIME in the last 168 hours. Cardiac Enzymes: No results for input(s): CKTOTAL, CKMB, CKMBINDEX, TROPONINI in the last 168 hours. BNP (last 3 results) No results for input(s): PROBNP in the last 8760 hours. HbA1C: No results for input(s): HGBA1C in the last 72 hours. CBG: No results for input(s): GLUCAP in the last 168 hours. Lipid Profile: No results for input(s): CHOL, HDL, LDLCALC, TRIG, CHOLHDL, LDLDIRECT in the last 72 hours. Thyroid Function Tests: No results for input(s): TSH, T4TOTAL, FREET4, T3FREE, THYROIDAB in the last 72 hours. Anemia Panel: No results for input(s): VITAMINB12, FOLATE, FERRITIN, TIBC, IRON, RETICCTPCT in the last 72 hours. Urine analysis:    Component Value Date/Time   COLORURINE STRAW (A) 02/24/2021 2047   APPEARANCEUR CLEAR (A) 02/24/2021 2047   LABSPEC 1.010 02/24/2021 2047   PHURINE 5.0 02/24/2021 2047   GLUCOSEU NEGATIVE 02/24/2021 2047   HGBUR NEGATIVE 02/24/2021 2047   BILIRUBINUR NEGATIVE 02/24/2021 2047   KETONESUR NEGATIVE 02/24/2021 2047   PROTEINUR  NEGATIVE 02/24/2021 2047   NITRITE NEGATIVE 02/24/2021 2047   LEUKOCYTESUR NEGATIVE 02/24/2021 2047   Sepsis Labs: _0 (procalcitonin:4,lacticidven:4) )No results found for this or any previous visit (from the past 240 hour(s)).   Radiological Exams on Admission: No results found.   EKG: Not done in ED, will get one.   Assessment/Plan Principal Problem:   Intractable headache Active Problems:   Anxiety and depression   Acid reflux   HLD (hyperlipidemia)   Alcohol abuse   Nasopharyngeal carcinoma (HCC)   HTN (hypertension)   COPD (chronic obstructive pulmonary disease) (HCC)   Diabetes mellitus without complication (HCC)   Stroke (HCC)   Intractable headache: Etiology is not clear.  Patient had multiple CT of head and MRI of brain without clear explanation.  Differential diagnosis is broad.  Patient has elevated CRP 75 on 02/26/2021. Dr. Orlie Dakin of [facility] is planning to do GCA scan to better exclude giant cell arteritis. Dr. Cheral Marker of neuro is consulted.  Trigeminal neuralgia is also a potential differential diagnosis.  -will place in med-surg bed for obs -continue home meds: Depakote, Neurontin, sumatriptan -check CRP and ESR -prn percocet and morphine -pt was given 10 mg of Decadron in ED -->will give prednisone 53m daily tomorrow -MRI of brain with and without contrast per Dr. CMike Gipof oncolgy   Anxiety and depression -Continue home medications  Acid reflux -Pepcid  HLD (hyperlipidemia) -Lipitor  Alcohol abuse -In remission for more than 1 year  Nasopharyngeal carcinoma ([facility]: S/p of chemo and radiation therapy. Following up with Dr. CMike Gip I consulted Dr. CMike Gipof oncology. Per Dr. CMike Gip pt needs to repeat MRI-brain since the last MRI was affected by motion artifact.  Dr. CMike Gipconsulted neurology (Dr. SManuella Ghazi, but he was out of town now. Dr.  Corcoran believe ENT had put pt on steroids. -will repeat MRI-brain with and without  contrast  HTN (hypertension) -IV hydralazine as needed -Cozaar  COPD (chronic obstructive pulmonary disease) ([place]): Stable -As needed albuterol inhaler and Mucinex  Diabetes mellitus without complication [facility]): Recent A1c 8.9, poorly controlled.  Patient taking Metformin and glipizide -SSI  Stroke ([place]) -Continue aspirin, Plavix and Lipitor         DVT ppx: SQ Heparin    Code Status: Full code Family Communication: not done, no family member is at bed side.    Disposition Plan:  Anticipate discharge back to previous environment Consults called:  Dr. Cheral Marker of neurology Admission status and Level of care: Med-Surg:  for obs    Status is: Observation  The patient remains OBS appropriate and will d/c before 2 midnights.  Dispo: The patient is from: Home              Anticipated d/c is to: Home              Patient currently is not medically stable to d/c.   Difficult to place patient No          Date of Service 03/06/2021    Ivor Costa [facility]   If 7PM-7AM, please contact night-coverage [email or web address] 03/06/2021, 11:08 AM

## 2021-03-06 NOTE — ED Notes (Signed)
RN called to patient x3. Patient reports no relief with ice pack, so ice pack removed. RN offered to give patient a blanket to put over eyes and block out light. Patient refused. Patient was again educated regarding rooming and treatment process. Patient provided with pillow to promote comfort.

## 2021-03-06 NOTE — ED Provider Notes (Signed)
   Clinical Course as of 03/06/21 0845  Sat Mar 06, 2021  0721 Care transferred at change of shift to Dr. Tamala Julian pending reassessment and consideration of in-house neurology consultation. [JS]  8832 Pt comfortably asleep with normal vitals on room air [DS]  0813 Return to the bedside and reevaluate the patient.  We discussed his subacute headaches and multimodal pain control.  He reports that he has a box at home for his medications that has twice daily dosing, and he reports not taking medicines more frequently than twice daily.  We again discussed analgesic medications that are dosed 3 or 4 times per day.  He seems to have a very poor understanding of taking medications more frequently than twice daily.  I continue to suspect that patient's poor pain control is due to poor understanding of appropriate dosing of medications.  I most concerned about him continue to overutilize the emergency department due to this, so we discussed medical observation mission for pain control and he is agreeable.  We will page hospitalist and discussed the case with him for possible observation [DS]  0845 I discuss the case with Dr. Blaine Hamper, who agrees to admit [DS]    Clinical Course User Index [DS] Vladimir Crofts, MD [JS] Paulette Blanch, MD      Vladimir Crofts, MD 03/06/21 [phone]

## 2021-03-06 NOTE — Consult Note (Signed)
NEURO HOSPITALIST CONSULT NOTE   Requestig physician: Dr. Blaine Hamper  Reason for Consult: Intractable right sided headache involving the face in the context of a recent diagnosis of trigeminal neuralgia as well as a history of nasopharyngeal carcinoma treated with radiation.   History obtained from:   Patient and Chart     HPI:                                                                                                                                          Kenneth Franklin is an 75 y.o. male with a PMHx of stroke, EtOH abuse, squamous cell cancer, nasopharyngeal carcinoma s/p radiation and chemotherapy about 6 months ago depression, anxiety, recent diagnosis of trigeminal neuralgia, DM, HLD, dyspnea and HTN who re-presents with severe right sided headache and facial pain.   He initially presented to the ED on Wednesday after having a fall at home. He initially complained of bilateral leg pain as well as a one week history of generalized weakness with involuntary muscle spasms, the latter having caused the fall per patient. The fall occurred while the patient was opening his refrigerator and his leg "jerked" suddenly. He did not strike his head. About 4 hours after arriving to the ED, the patient then stated that he had been seen in multiple ERs 8 times in the past week for headache - he would bet relief for a short period of time, the the pain would then come back. He also saw Neurology as an outpatient on Tuesday due to the intractable chronic headache; a normal examination was documented for that visit; he was provided empiric prescriptions for gabapentin and Depakote. Headache was 8/10 on the day of the outpatient Neurology evaluation. When he was seen in the ED, he described his headache at that time as "20/10" and involving his whole head, much more severe than the prior day. It was associated with photophobia. Chart review by EDP on 4/6 revealed that the ED visit to [facility] was the  patient's seventh ED visit since 3/30 for headaches. Last CT scan of the brain was on 4/3 where patient had a benign work-up in the [facility], and was discharged with Imitrex added to his regimen. His headache improved in the ED on 4/6 and the patient was discharged at about 10 PM.    He then re-presented today with recurrent severe right sided facial pain. The patient was administered IV Dilaudid, Toradol and Decadron. He still has recurrent peaks of facial pain, some precipitated by touch, with minimum pain of 4/10 and pain at the time of interview of 5/10. The patient traces out the distribution of the pain, with involves his right forehead in the V1 distribution, right frontal and temporal scalp extending to the ear, the back  of the ear, the right V2 distribution from the right half of his nose and extending to his cheek, the right jaw and perioral region in a V3 distribution, and the right side of his mouth including his gums on the right and right buccal surface of his mouth, but sparing the region of the eye. The pain extends to but does not cross the midline.   Of note, [facility] neurology is scheduling him for ultrasound of temporal arteries and entertaining the diagnosis of trigeminal neuralgia. Review of chart indicates his sed rate has been elevated at 80. He thinks he has been on steroids but none currently.   Dr. Blaine Hamper reviewed the [facility] Neuorlogist's note from 4/5 (Dr. Orlie Dakin), which states "The etiology of this headache is unclear. He does not meet diagnostic criteria for migraine, tension, or cluster headache. The differential includes atypical facial pain (perineural spread along the right V2 region, poorly evaluated due to motion artifact), cervicogenic headache, hemicrania continua, versus primary stabbing headache".   The patient had an MRI brain on 02/26/21 which revealed the following: "Nasopharyngeal malignancy which is grossly similar in extent with perineural spread along the  right V2 nerve which is poorly evaluated due to extensive motion artifact. Recommend repeat exam with sedation or anesthesia if necessary."  The patient has been admitted by the Hospitalist service, with initial plan as follows:  -continue home meds: Depakote, Neurontin, sumatriptan -check CRP and ESR -prn percocet and morphine -pt was given 10 mg of Decadron in ED -MRI of brain with and without contrast per Dr. Mike Gip of oncolgy  Past Medical History:  Diagnosis Date  . Acute ischemic stroke ([place]) 2017  . Alcohol abuse   . Anxiety   . Cancer ([place])   . Depression   . Diabetes mellitus without complication ([place])   . Dyspnea    pcp knows and ordered rescue inhaler  . Hyperlipidemia   . Hypertension   . Skin cancer    Squamous Cell Carcinoma In Situ    Past Surgical History:  Procedure Laterality Date  . CATARACT EXTRACTION Bilateral   . COLONOSCOPY    . NASOPHARYNGOSCOPY N/A 08/12/2020   Procedure: ENDOSCOPIC NASOPHARYNGOSCOPY WITH BIOPSY;  Surgeon: Margaretha Sheffield, MD;  Location: [facility] ORS;  Service: ENT;  Laterality: N/A;  . PORTA CATH INSERTION N/A 08/24/2020   Procedure: PORTA CATH INSERTION;  Surgeon: Algernon Huxley, MD;  Location: [facility];  Service: Cardiovascular;  Laterality: N/A;    Family History  Problem Relation Age of Onset  . Diabetes Brother               Social History:  reports that he quit smoking about 18 months ago. His smoking use included cigars and cigarettes. He has a 40.00 pack-year smoking history. He has never used smokeless tobacco. He reports previous alcohol use. He reports that he does not use drugs.  Allergies  Allergen Reactions  . Propoxyphene     Unknown reaction    MEDICATIONS:  Prior to Admission:  Medications Prior to Admission  Medication Sig Dispense Refill Last Dose  . ALPRAZolam (XANAX) 0.25 MG tablet  TAKE 1 TABLET EVERY DAY AS NEEDED FOR ANXIETY (Patient taking differently: Take 0.25 mg by mouth daily as needed for anxiety.) 30 tablet 2 Unknown at PRN  . aspirin EC 81 MG tablet Take 81 mg by mouth daily. Swallow whole.     Marland Kitchen atorvastatin (LIPITOR) 80 MG tablet TAKE 1 TABLET (80 MG TOTAL) BY MOUTH NIGHTLY. (Patient taking differently: Take 80 mg by mouth every evening.) 90 tablet 0 03/04/2021 at 2000  . busPIRone (BUSPAR) 10 MG tablet TAKE 1 TABLET (10 MG TOTAL) BY MOUTH 2 (TWO) TIMES DAILY. (Patient taking differently: Take 10 mg by mouth at bedtime.) 180 tablet 0 Unknown at PRN  . clopidogrel (PLAVIX) 75 MG tablet TAKE 1 TABLET (75 MG TOTAL) BY MOUTH DAILY. 90 tablet 0 03/05/2021 at 0800  . DENTA 5000 PLUS 1.1 % CREA dental cream Place 1 application onto teeth at bedtime.      . divalproex (DEPAKOTE ER) 250 MG 24 hr tablet Take 250-750 mg by mouth See admin instructions. Take 1 tablet (253m) by mouth at bedtime for 7 nights, then take 2 tablets (5014m by mouth at bedtime for 7 nights then take 3 tablets (75047mby mouth at bedtime   03/04/2021 at 2000  . escitalopram (LEXAPRO) 20 MG tablet Take 20 mg by mouth every morning.    03/05/2021 at 0800  . famotidine (PEPCID) 40 MG tablet Take 1 tablet (40 mg total) by mouth every evening. 30 tablet 1 03/05/2021 at 2000  . feeding supplement (ENSURE ENLIVE / ENSURE PLUS) LIQD Take 237 mLs by mouth 3 (three) times daily between meals. 237 mL 12   . fluticasone (FLOVENT HFA) 110 MCG/ACT inhaler Inhale 2 puffs into the lungs daily as needed (shortness of breath).   Unknown at PRN  . glipiZIDE (GLUCOTROL) 5 MG tablet Take 5 mg by mouth daily before breakfast.    03/05/2021 at 0800  . HYDROcodone-acetaminophen (NORCO) 5-325 MG tablet Take 1 tablet by mouth every 6 (six) hours as needed for moderate pain. 20 tablet 0 Unknown at PRN  . lidocaine-prilocaine (EMLA) cream Apply 1 application topically as needed. Apply small amount to port site at least 1 hour prior to it  being accessed, cover with plastic wrap 30 g 1   . losartan (COZAAR) 100 MG tablet TAKE 1 TABLET (100 MG TOTAL) BY MOUTH DAILY. (Patient taking differently: Take 100 mg by mouth every morning.) 90 tablet 0 03/05/2021 at 0800  . metFORMIN (GLUCOPHAGE-XR) 500 MG 24 hr tablet Take 500 mg by mouth daily.   03/05/2021 at 0800  . methocarbamol (ROBAXIN) 500 MG tablet Take 1 tablet (500 mg total) by mouth every 6 (six) hours as needed for muscle spasms (breakthrough pain). 30 tablet 0 Unknown at PRN  . mirtazapine (REMERON) 30 MG tablet Take 1 tablet (30 mg total) by mouth at bedtime. (Patient taking differently: Take 15 mg by mouth at bedtime as needed.) 30 tablet 0 Unknown at PRN  . polyvinyl alcohol (LIQUIFILM TEARS) 1.4 % ophthalmic solution Place 1 drop into both eyes as needed for dry eyes. 15 mL 0 Unknown at PRN  . sodium chloride (OCEAN) 0.65 % SOLN nasal spray Place 1 spray into both nostrils as needed for congestion.  0 Unknown at PRN  . SUMAtriptan (IMITREX) 25 MG tablet Take 25 mg by mouth 2 (two) times daily as needed for  migraine.   Unknown at PRN  . gabapentin (NEURONTIN) 300 MG capsule Take 300-600 mg by mouth See admin instructions. Take 2 capsules (655m) by mouth at bedtime for 7 days then take 1 capsule (3049m by mouth in the morning and 2 capsules (60057mby mouth at night      Scheduled: . aspirin EC  81 mg Oral Daily  . atorvastatin  80 mg Oral QPM  . busPIRone  10 mg Oral QHS  . clopidogrel  75 mg Oral Daily  . divalproex  250 mg Oral QHS  . [START ON 03/13/2021] divalproex  500 mg Oral QHS  . [START ON 03/20/2021] divalproex  750 mg Oral QHS  . escitalopram  20 mg Oral Daily  . famotidine  40 mg Oral QPM  . feeding supplement  237 mL Oral TID BM  . [START ON 03/13/2021] gabapentin  300 mg Oral Q0600  . gabapentin  600 mg Oral QHS  . heparin  5,000 Units Subcutaneous Q8H  . insulin aspart  0-5 Units Subcutaneous QHS  . insulin aspart  0-9 Units Subcutaneous TID WC  . losartan   100 mg Oral Daily  . [START ON 03/07/2021] predniSONE  50 mg Oral Q breakfast     ROS:                                                                                                                                       No CP, SOB, N/V, neck pain, abdominal pain or dizziness.      Blood pressure (!) 147/75, pulse 68, temperature 98.1 F (36.7 C), resp. rate 16, height 6' (1.829 m), weight 61.2 kg, SpO2 98 %.   General Examination:                                                                                                       Physical Exam  HEENT-  [place]/AT    Lungs- Respirations unlabored Extremities- No edema  Neurological Examination Mental Status:  Awake and alert. Fully oriented. Thought content appropriate.  Speech fluent without evidence of aphasia.  Able to follow all commands without difficulty. Cranial Nerves: II: Temporal visual fields intact without extinction to DSS. Right eye acuity is to badge reading at 14 inches, but has trouble reading when further away. Left eye with decreased acuity and inability to read without contact lens, which patient does not have in currently. Findings in the context of known prior prosthetic right lens replacement for cataract and  left cataract extraction without lens replacement. PERRL.   III,IV, VI: Right palpebral fissure slightly wider than on the left, but no delay in blink on the right. EOMI. No nystagmus. No double vision. Eye closure strength is equal bilaterally.  V: Intact to FT and a fine tissue-paper filament in V1, V2, V3 distribution but with decreased temp sensation in the same distribution. Left facial sensation is normal.  VII: Mildly decreased NL fold on the right. Widened palpebral fissure on the right but blink strength is normal. When contracting facial musculature, smile is symmetric and brow furrowing is also symmetric.  VIII: Hearing intact to conversation IX,X: No hypophonia. Palate elevates normally XI: Symmetric  shoulder shrug XII: Midline tongue extension Motor: Right : Upper extremity   5/5    Left:     Upper extremity   5/5  Lower extremity   5/5     Lower extremity   5/5 No pronator drift.  Sensory: Temp decreased to RUE. Temp normal to LUE and BLE. FT sensation equal x 4. No extinction to DSS.  Deep Tendon Reflexes: 2+ and symmetric throughout Plantars: Equivocal bilaterally Cerebellar: No ataxia with FNF bilaterally Gait: Deferred   Lab Results: Basic Metabolic Panel: Recent Labs  Lab 03/03/21 1154 03/06/21 1036  NA 139 133*  K 4.4 4.9  CL 102 96*  CO2 28 28  GLUCOSE 184* 251*  BUN 15 26*  CREATININE 0.78 0.91  CALCIUM 9.9 9.8    CBC: Recent Labs  Lab 03/03/21 1154 03/06/21 1036  WBC 8.0 11.8*  HGB 13.1 11.2*  HCT 40.5 34.7*  MCV 91.8 91.3  PLT 332 326    Cardiac Enzymes: No results for input(s): CKTOTAL, CKMB, CKMBINDEX, TROPONINI in the last 168 hours.  Lipid Panel: No results for input(s): CHOL, TRIG, HDL, CHOLHDL, VLDL, LDLCALC in the last 168 hours.  Imaging: MR BRAIN W WO CONTRAST  Result Date: 03/06/2021 CLINICAL DATA:  Headache. History of nasopharyngeal carcinoma post treatment. EXAM: MRI HEAD WITHOUT AND WITH CONTRAST TECHNIQUE: Multiplanar, multiecho pulse sequences of the brain and surrounding structures were obtained without and with intravenous contrast. CONTRAST:  48m GADAVIST GADOBUTROL 1 MMOL/ML IV SOLN COMPARISON:  CT head 02/24/2021.  MRI head 08/13/2020 FINDINGS: Brain: Moderate generalized atrophy. Ventricular enlargement chronic and unchanged from prior studies. Extensive white matter hyperintensity bilaterally. Mild patchy hyperintensity in the pons. Negative for acute infarct, hemorrhage, mass. Negative for metastatic disease to the brain. Vascular: Normal arterial flow voids. Skull and upper cervical spine: Abnormal skull base described below. Otherwise negative calvarium. Sinuses/Orbits: Large nasopharyngeal mass on the prior study now shows  central necrosis and peripheral irregular enhancement. This extends throughout the sphenoid sinus and into the clivus. This is the initial post treatment study and continued follow-up is warranted. Bilateral mastoid effusion. Bilateral cataract extraction Other: None IMPRESSION: Atrophy and extensive chronic microvascular ischemia. No acute infarct negative for metastatic disease to the brain Treated large nasopharyngeal mass now shows central fluid intensity and peripheral enhancement filling the nasopharynx extending into the sphenoid sinus and clivus. Continued imaging follow-up recommended. Electronically Signed   By: CFranchot GalloM.D.   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 DDx for his pain includes secondary to perineural spread of squamous cell  carcinoma, temporal arteritis and trigeminal neuralgia. DDx favors trigeminal neuralgia as the patient reports an episode of identical symptoms in the same distribution that lasted for a period of time about 40 years ago, which spontaneously resolved.  1. Exam reveals mildly decreased nasolabial fold on the right and decreased temp sensation to RUE. Visual acuity is decreased asymmetrically in the context of prior cataract surgeries. No focal motor deficit is noted.  2. The patient had an MRI brain on 02/26/21 which revealed the following: "Nasopharyngeal malignancy which is grossly similar in extent with perineural spread along the right V2 nerve which is poorly evaluated due to extensive motion artifact. Recommend repeat exam with sedation or anesthesia if necessary." 3. Elevated CRP and ESR, consistent with an inflammatory process, including possible temporal arteritis.  Agree with Hospitalist plan including to continue home Depakote, Neurontin and sumatriptan; recheck of CRP and ESR; prn percocet and morphine; repeat MRI of brain with and without contrast.  Recommendations: 1. Pain control with Depakote and Neurontin.   2. Agree with starting prednisone 50 mg qd, but this may confound results of temporal artery biopsy.  3. Surgery consult for right temporal artery biopsy. Should be done by the end of this coming week at the latest to reduce the chance of steroid treatment confounding the biopsy results.  4. Second opinion from ENT regarding DDx of trigeminal neuralgia possibly triggered by the nasopharyngeal carcinoma with perineural spread as noted on recent MRI.    Electronically signed: Dr. Kerney Elbe 03/06/2021, 5:04 PM

## 2021-03-06 NOTE — ED Provider Notes (Signed)
[facility] Emergency Department Provider Note   ____________________________________________   Event Date/Time   First MD Initiated Contact with Patient 03/06/21 [phone]     (approximate)  I have reviewed the triage vital signs and the nursing notes.   HISTORY  Chief Complaint Headache  Level of V caveat: Patient is a vague historian and is not clear which medications he is taking  HPI Kenneth Franklin is a 75 y.o. male who presents to the ED from home with a chief complaint of headache. 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  This is the patient's 8 ED visits since 02/24/2021 for intractable chronic headaches. He saw [facility] on 03/02/2021 who increased his gabapentin x3 days and started him on Depakote.  This is in addition to Imitrex started by [facility] on 02/28/2021.  Complains of sharp, right-sided headaches involving his face.  This is typical of the pain that he has been experiencing since 02/24/2021.  He has had numerous CT head.  [place] neurology is scheduling him for ultrasound of temporal arteries and entertaining the diagnosis of trigeminal neuralgia.  Review of chart indicates his sed rate has been elevated at 80.  He thinks he has been on steroids but none currently.  It is unclear whether patient takes the medications himself or if a family member provides him with a pillbox or gives him his medications.  Headache is associated with photophobia.  Denies neck pain, chest pain, shortness of breath, abdominal pain, nausea, vomiting or dizziness.  There is a note in the computer from his oncologist for an urgent to local neurology visit with Dr. Manuella Ghazi.      Past Medical History:  Diagnosis Date  . Acute ischemic stroke ([place]) 2017  . Alcohol abuse   . Anxiety   . Cancer ([place])   . Depression   . Diabetes mellitus without complication ([place])   .  Dyspnea    pcp knows and ordered rescue inhaler  . Hyperlipidemia   . Hypertension   . Skin cancer    Squamous Cell Carcinoma In Situ    Patient Active Problem List   Diagnosis Date Noted  . Acute nonintractable headache 02/28/2021  . Xerostomia 12/23/2020  . Constipation 12/03/2020  . Weight loss 12/03/2020  . Malnutrition of moderate degree 10/26/2020  . Painful swallowing 10/25/2020  . Inadequate oral intake 10/25/2020  . Mucositis due to antineoplastic therapy 10/25/2020  . Mucositis due to radiation therapy 10/25/2020  . Odynophagia 10/25/2020  . Dehydration 10/22/2020  . Nausea without vomiting 10/22/2020  . Poor appetite 10/22/2020  . Goals of care, counseling/discussion 09/21/2020  . Hypokalemia 09/15/2020  . 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 Encounter for antineoplastic chemotherapy 09/15/2020  . Hypomagnesemia 09/08/2020  . 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 Chronic obstructive pulmonary disease with acute exacerbation ([place]) 10/01/2019  . 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 Alcohol abuse 06/01/2017  . Tylenol overdose 05/31/2017  . Erroneous encounter - disregard 08/02/2016  . Acute ischemic stroke ([place]) 07/07/2016  . Tobacco use disorder 06/28/2016  . Tick bite of flank 06/16/2016  . Grief counseling 06/16/2016  . Generalized weakness 06/16/2016  . Absent pulse in lower extremity 09/14/2015  . Anxiety and depression 04/21/2015  . Diabetes mellitus type 2, controlled, without complications ([place]) 54/65/0354  . Acid reflux 04/21/2015  . Calcium blood increased 04/21/2015  . Dyslipidemia 04/21/2015  .  HLD (hyperlipidemia) 04/21/2015  . BP (high blood pressure) 04/21/2015    Past Surgical History:  Procedure Laterality Date  . CATARACT EXTRACTION Bilateral   . COLONOSCOPY    . NASOPHARYNGOSCOPY N/A 08/12/2020   Procedure: ENDOSCOPIC NASOPHARYNGOSCOPY WITH BIOPSY;  Surgeon: Margaretha Sheffield, MD;   Location: [facility] ORS;  Service: ENT;  Laterality: N/A;  . PORTA CATH INSERTION N/A 08/24/2020   Procedure: PORTA CATH INSERTION;  Surgeon: Algernon Huxley, MD;  Location: [place] [facility];  Service: Cardiovascular;  Laterality: N/A;    Prior to Admission medications   Medication Sig Start Date End Date Taking? Authorizing Provider  ALPRAZolam (XANAX) 0.25 MG tablet TAKE 1 TABLET EVERY DAY AS NEEDED FOR ANXIETY Patient taking differently: Take 0.25 mg by mouth daily as needed for anxiety. 10/26/16   Roselee Nova, MD  aspirin EC 81 MG tablet Take 81 mg by mouth daily. Swallow whole.    [provider]  atorvastatin (LIPITOR) 80 MG tablet TAKE 1 TABLET (80 MG TOTAL) BY MOUTH NIGHTLY. Patient taking differently: Take 80 mg by mouth every evening. 11/14/16   Roselee Nova, MD  busPIRone (BUSPAR) 10 MG tablet TAKE 1 TABLET (10 MG TOTAL) BY MOUTH 2 (TWO) TIMES DAILY. Patient taking differently: Take 10 mg by mouth at bedtime. 08/16/16   Roselee Nova, MD  clopidogrel (PLAVIX) 75 MG tablet TAKE 1 TABLET (75 MG TOTAL) BY MOUTH DAILY. 07/25/16   Roselee Nova, MD  DENTA 5000 PLUS 1.1 % CREA dental cream Place 1 application onto teeth at bedtime.  03/18/20   [provider]  escitalopram (LEXAPRO) 20 MG tablet Take 20 mg by mouth every morning.     [provider]  famotidine (PEPCID) 40 MG tablet Take 1 tablet (40 mg total) by mouth every evening. 02/24/21 02/24/22  Naaman Plummer, MD  feeding supplement (ENSURE ENLIVE / ENSURE PLUS) LIQD Take 237 mLs by mouth 3 (three) times daily between meals. 11/01/20   Vashti Hey, MD  fluticasone (FLOVENT HFA) 110 MCG/ACT inhaler Inhale 2 puffs into the lungs daily as needed (shortness of breath).    [provider]  glipiZIDE (GLUCOTROL) 5 MG tablet Take 5 mg by mouth daily before breakfast.     [provider]  HYDROcodone-acetaminophen (NORCO) 5-325 MG tablet Take 1 tablet by mouth every 6 (six)  hours as needed for moderate pain. 02/24/21   Lequita Asal, MD  lidocaine (XYLOCAINE) 2 % solution USE AS DIRECTED 5 MLS IN THE MOUTH OR THROAT EVERY 6 (SIX) HOURS AS NEEDED FOR MOUTH PAIN. Patient not taking: No sig reported 11/13/20   Sindy Guadeloupe, MD  lidocaine-prilocaine (EMLA) cream Apply 1 application topically as needed. Apply small amount to port site at least 1 hour prior to it being accessed, cover with plastic wrap 08/24/20   Lequita Asal, MD  losartan (COZAAR) 100 MG tablet TAKE 1 TABLET (100 MG TOTAL) BY MOUTH DAILY. Patient taking differently: Take 100 mg by mouth every morning. 08/16/16   Roselee Nova, MD  magic mouthwash w/lidocaine SOLN Take 5 mLs by mouth 4 (four) times daily as needed for mouth pain. Sig: Swish/Spit 5-10 ml four times a day as needed. Dispense 480 ml. 1RF Patient not taking: No sig reported 11/12/20   Verlon Au, NP  magnesium oxide (MAG-OX) 400 MG tablet TAKE 1 TABLET BY MOUTH EVERY DAY Patient not taking: No sig reported 11/18/20   Mike Gip,  Drue Second, MD  metFORMIN (GLUCOPHAGE) 500 MG tablet Take 1 tablet (500 mg total) by mouth daily with breakfast. 05/18/16   Roselee Nova, MD  metFORMIN (GLUCOPHAGE-XR) 500 MG 24 hr tablet Take 500 mg by mouth daily. 02/23/21   [provider]  methocarbamol (ROBAXIN) 500 MG tablet Take 1 tablet (500 mg total) by mouth every 6 (six) hours as needed for muscle spasms (breakthrough pain). 03/03/21   Vladimir Crofts, MD  mirtazapine (REMERON) 30 MG tablet Take 1 tablet (30 mg total) by mouth at bedtime. 06/02/17   Fritzi Mandes, MD  nystatin cream (MYCOSTATIN) Apply 1 application topically 2 (two) times daily as needed (yeast).  Patient not taking: No sig reported 05/13/20   [provider]  pantoprazole (PROTONIX) 20 MG tablet Take 20 mg by mouth daily before lunch.  Patient not taking: No sig reported    [provider]  phenol (CHLORASEPTIC) 1.4 % LIQD Use as directed 1 spray in the  mouth or throat as needed for throat irritation / pain. 11/01/20   Vashti Hey, MD  polyvinyl alcohol (LIQUIFILM TEARS) 1.4 % ophthalmic solution Place 1 drop into both eyes as needed for dry eyes. 11/01/20   Vashti Hey, MD  sitaGLIPtin (JANUVIA) 100 MG tablet Take 100 mg by mouth every morning. Patient not taking: No sig reported    [provider]  sodium chloride (OCEAN) 0.65 % SOLN nasal spray Place 1 spray into both nostrils as needed for congestion. 11/01/20   Vashti Hey, MD  VITAMIN D PO Take 1 capsule by mouth daily. Patient not taking: No sig reported    [provider]  prochlorperazine (COMPAZINE) 10 MG tablet Take 1 tablet (10 mg total) by mouth every 6 (six) hours as needed (Nausea or vomiting). 09/08/20 12/28/20  Lequita Asal, MD    Allergies Propoxyphene  Family History  Problem Relation Age of Onset  . Diabetes Brother     Social History Social History   Tobacco Use  . Smoking status: Former Smoker    Packs/day: 1.00    Years: 40.00    Pack years: 40.00    Types: Cigars, Cigarettes    Quit date: 08/12/2019    Years since quitting: 1.5  . Smokeless tobacco: Never Used  Vaping Use  . Vaping Use: Never used  Substance Use Topics  . Alcohol use: Not Currently    Comment: H/O ETOH ABUSE BUT DENIES DRINKING DURING 08-11-20 INTERVIEW  . Drug use: No    Review of Systems  Constitutional: No fever/chills Eyes: No visual changes. ENT: No sore throat. Cardiovascular: Denies chest pain. Respiratory: Denies shortness of breath. Gastrointestinal: No abdominal pain.  No nausea, no vomiting.  No diarrhea.  No constipation. Genitourinary: Negative for dysuria. Musculoskeletal: Negative for back pain. Skin: Negative for rash. Neurological: Positive for headache. Negative for focal weakness or numbness.   ____________________________________________   PHYSICAL EXAM:  VITAL SIGNS: ED Triage Vitals   Enc Vitals Group     BP 03/06/21 0323 (!) 144/88     Pulse Rate 03/06/21 0323 88     Resp 03/06/21 0323 18     Temp 03/06/21 0323 98.1 F (36.7 C)     Temp Source 03/06/21 0323 Oral     SpO2 03/06/21 0323 100 %     Weight 03/06/21 0321 135 lb (61.2 kg)     Height 03/06/21 0321 6' (1.829 m)     Head Circumference --  Peak Flow --      Pain Score 03/06/21 0321 10     Pain Loc --      Pain Edu? --      Excl. in [place]? --     Constitutional: Alert and oriented.  Uncomfortable appearing and in mild to moderate acute distress. Eyes: Conjunctivae are normal. PERRL. EOMI. Head: Atraumatic. Ears: Bilateral TMs unremarkable. Nose: No congestion/rhinnorhea. Mouth/Throat: Mucous membranes are moist.  Oropharynx non-erythematous. Neck: No stridor.  Supple neck without meningismus. Cardiovascular: Normal rate, regular rhythm. Grossly normal heart sounds.  Good peripheral circulation. Respiratory: Normal respiratory effort.  No retractions. Lungs CTAB. Gastrointestinal: Soft and nontender. No distention. No abdominal bruits. No CVA tenderness. Musculoskeletal: No lower extremity tenderness nor edema.  No joint effusions. Neurologic: Alert and oriented x3.  CN II to XII grossly intact.  Normal speech and language. No gross focal neurologic deficits are appreciated.  Skin:  Skin is warm, dry and intact. No rash noted.  No petechiae. Psychiatric: Mood and affect are normal. Speech and behavior are normal.  ____________________________________________   LABS (all labs ordered are listed, but only abnormal results are displayed)  Labs Reviewed - No data to display ____________________________________________  EKG  None ____________________________________________  RADIOLOGY I, Kimblery Diop J, personally viewed and evaluated these images (plain radiographs) as part of my medical decision making, as well as reviewing the written report by the radiologist.  ED MD interpretation:  None  Official radiology report(s): No results found.  ____________________________________________   PROCEDURES  Procedure(s) performed (including Critical Care):  Procedures   ____________________________________________   INITIAL IMPRESSION / ASSESSMENT AND PLAN / ED COURSE  As part of my medical decision making, I reviewed the following data within the [place] notes reviewed and incorporated, Old chart reviewed and Notes from prior ED visits     75 year old male with intractable chronic headaches presenting for his eighth ED visit since 02/24/2021. Differential diagnosis includes, but is not limited to, intracranial hemorrhage, meningitis/encephalitis, previous head trauma, cavernous venous thrombosis, tension headache, temporal arteritis, migraine or migraine equivalent, idiopathic intracranial hypertension, and non-specific headache.  Patient has no focal neurological deficits on examination.  Recent lab work done on 03/03/2021 reviewed.  Again, patient has been imaged multiple times; do not feel repeat CT scan is warranted at this time.  Will administer IV Dilaudid, Toradol, Decadron to start and reassess  Clinical Course as of 03/06/21 [id number]  Sat Mar 06, 2021  0721 Care transferred at change of shift to Dr. Tamala Julian pending reassessment and consideration of in-house neurology consultation. [JS]    Clinical Course User Index [JS] Paulette Blanch, MD     ____________________________________________   FINAL CLINICAL IMPRESSION(S) / ED DIAGNOSES  Final diagnoses:  Bad headache     ED Discharge Orders    None      *Please note:  Kenneth Franklin was evaluated in Emergency Department on 03/06/2021 for the symptoms described in the history of present illness. He was evaluated in the context of the global COVID-19 pandemic, which necessitated consideration that the patient might be at risk for infection with the SARS-CoV-2 virus that causes COVID-19.  Institutional protocols and algorithms that pertain to the evaluation of patients at risk for COVID-19 are in a state of rapid change based on information released by regulatory bodies including the CDC and federal and state organizations. These policies and algorithms were followed during the patient's care in the ED.  Some ED evaluations and interventions may  be delayed as a result of limited staffing during and the pandemic.*   Note:  This document was prepared using Dragon voice recognition software and may include unintentional dictation errors.   Paulette Blanch, MD 03/06/21 [phone]

## 2021-03-07 DIAGNOSIS — R519 Headache, unspecified: Secondary | ICD-10-CM | POA: Diagnosis present

## 2021-03-07 DIAGNOSIS — K219 Gastro-esophageal reflux disease without esophagitis: Secondary | ICD-10-CM | POA: Diagnosis present

## 2021-03-07 DIAGNOSIS — E43 Unspecified severe protein-calorie malnutrition: Secondary | ICD-10-CM | POA: Diagnosis present

## 2021-03-07 DIAGNOSIS — Z66 Do not resuscitate: Secondary | ICD-10-CM | POA: Diagnosis present

## 2021-03-07 DIAGNOSIS — Z9221 Personal history of antineoplastic chemotherapy: Secondary | ICD-10-CM | POA: Diagnosis not present

## 2021-03-07 DIAGNOSIS — Z79899 Other long term (current) drug therapy: Secondary | ICD-10-CM | POA: Diagnosis not present

## 2021-03-07 DIAGNOSIS — Z888 Allergy status to other drugs, medicaments and biological substances status: Secondary | ICD-10-CM | POA: Diagnosis not present

## 2021-03-07 DIAGNOSIS — Z515 Encounter for palliative care: Secondary | ICD-10-CM | POA: Diagnosis not present

## 2021-03-07 DIAGNOSIS — G500 Trigeminal neuralgia: Secondary | ICD-10-CM | POA: Diagnosis present

## 2021-03-07 DIAGNOSIS — Z833 Family history of diabetes mellitus: Secondary | ICD-10-CM | POA: Diagnosis not present

## 2021-03-07 DIAGNOSIS — Z8673 Personal history of transient ischemic attack (TIA), and cerebral infarction without residual deficits: Secondary | ICD-10-CM | POA: Diagnosis not present

## 2021-03-07 DIAGNOSIS — Z681 Body mass index (BMI) 19 or less, adult: Secondary | ICD-10-CM | POA: Diagnosis not present

## 2021-03-07 DIAGNOSIS — E785 Hyperlipidemia, unspecified: Secondary | ICD-10-CM | POA: Diagnosis present

## 2021-03-07 DIAGNOSIS — Z7984 Long term (current) use of oral hypoglycemic drugs: Secondary | ICD-10-CM | POA: Diagnosis not present

## 2021-03-07 DIAGNOSIS — Z20822 Contact with and (suspected) exposure to covid-19: Secondary | ICD-10-CM | POA: Diagnosis present

## 2021-03-07 DIAGNOSIS — Z7902 Long term (current) use of antithrombotics/antiplatelets: Secondary | ICD-10-CM | POA: Diagnosis not present

## 2021-03-07 DIAGNOSIS — F1010 Alcohol abuse, uncomplicated: Secondary | ICD-10-CM | POA: Diagnosis present

## 2021-03-07 DIAGNOSIS — E119 Type 2 diabetes mellitus without complications: Secondary | ICD-10-CM | POA: Diagnosis present

## 2021-03-07 DIAGNOSIS — G44009 Cluster headache syndrome, unspecified, not intractable: Secondary | ICD-10-CM | POA: Diagnosis not present

## 2021-03-07 DIAGNOSIS — F418 Other specified anxiety disorders: Secondary | ICD-10-CM | POA: Diagnosis present

## 2021-03-07 DIAGNOSIS — Z87891 Personal history of nicotine dependence: Secondary | ICD-10-CM | POA: Diagnosis not present

## 2021-03-07 DIAGNOSIS — Z85828 Personal history of other malignant neoplasm of skin: Secondary | ICD-10-CM | POA: Diagnosis not present

## 2021-03-07 DIAGNOSIS — R400 Somnolence: Secondary | ICD-10-CM | POA: Diagnosis present

## 2021-03-07 DIAGNOSIS — Z7982 Long term (current) use of aspirin: Secondary | ICD-10-CM | POA: Diagnosis not present

## 2021-03-07 DIAGNOSIS — I10 Essential (primary) hypertension: Secondary | ICD-10-CM | POA: Diagnosis present

## 2021-03-07 DIAGNOSIS — C119 Malignant neoplasm of nasopharynx, unspecified: Secondary | ICD-10-CM | POA: Diagnosis present

## 2021-03-07 DIAGNOSIS — Z923 Personal history of irradiation: Secondary | ICD-10-CM | POA: Diagnosis not present

## 2021-03-07 LAB — GLUCOSE, CAPILLARY
Glucose-Capillary: 150 mg/dL — ABNORMAL HIGH (ref 70–99)
Glucose-Capillary: 251 mg/dL — ABNORMAL HIGH (ref 70–99)
Glucose-Capillary: 237 mg/dL — ABNORMAL HIGH (ref 70–99)
Glucose-Capillary: 232 mg/dL — ABNORMAL HIGH (ref 70–99)

## 2021-03-07 LAB — BASIC METABOLIC PANEL
Anion gap: 8 (ref 5–15)
BUN: 24 mg/dL — ABNORMAL HIGH (ref 8–23)
CO2: 30 mmol/L (ref 22–32)
Calcium: 9.7 mg/dL (ref 8.9–10.3)
Chloride: 97 mmol/L — ABNORMAL LOW (ref 98–111)
Creatinine, Ser: 0.76 mg/dL (ref 0.61–1.24)
GFR, Estimated: >60 mL/min (ref >60)
Glucose, Bld: 145 mg/dL — ABNORMAL HIGH (ref 70–99)
Potassium: 3.9 mmol/L (ref 3.5–5.1)
Sodium: 135 mmol/L (ref 135–145)

## 2021-03-07 LAB — CBC
HCT: 35.4 % — ABNORMAL LOW (ref 39.0–52.0)
Hemoglobin: 11.6 g/dL — ABNORMAL LOW (ref 13.0–17.0)
MCH: 29.5 pg (ref 26.0–34.0)
MCHC: 32.8 g/dL (ref 30.0–36.0)
MCV: 90.1 fL (ref 80.0–100.0)
Platelets: 310 10*3/uL (ref 150–400)
RBC: 3.93 MIL/uL — ABNORMAL LOW (ref 4.22–5.81)
RDW: 13.4 % (ref 11.5–15.5)
WBC: 8.6 10*3/uL (ref 4.0–10.5)
nRBC: 0 % (ref 0.0–0.2)

## 2021-03-07 LAB — MAGNESIUM: Magnesium: 2 mg/dL (ref 1.7–2.4)

## 2021-03-07 MED ORDER — SODIUM CHLORIDE 0.9 % IV SOLN
12.5000 mg | Freq: Four times a day (QID) | INTRAVENOUS | Status: DC | PRN
  Administered 2021-03-08: 08:00:00 12.5 mg via INTRAVENOUS
  Filled 2021-03-07: qty 0.5

## 2021-03-07 MED ORDER — CLONAZEPAM 0.25 MG PO TBDP
0.5000 mg | ORAL_TABLET | Freq: Three times a day (TID) | ORAL | Status: DC | PRN
  Filled 2021-03-07: qty 2

## 2021-03-07 MED ORDER — HYDROMORPHONE HCL 1 MG/ML IJ SOLN
0.5000 mg | Freq: Once | INTRAMUSCULAR | Status: AC
  Administered 2021-03-07: 0.5 mg via INTRAVENOUS
  Filled 2021-03-07: qty 1

## 2021-03-07 MED ORDER — OXYCODONE-ACETAMINOPHEN 5-325 MG PO TABS
1.0000 | ORAL_TABLET | ORAL | Status: DC | PRN
  Administered 2021-03-08 – 2021-03-10 (×9): 2 via ORAL
  Administered 2021-03-10: 1 via ORAL
  Administered 2021-03-10 – 2021-03-11 (×4): 2 via ORAL
  Administered 2021-03-12: 1 via ORAL
  Administered 2021-03-12: 2 via ORAL
  Filled 2021-03-07: qty 2
  Filled 2021-03-07: qty 1
  Filled 2021-03-07 (×8): qty 2
  Filled 2021-03-07: qty 1
  Filled 2021-03-07 (×5): qty 2

## 2021-03-07 MED ORDER — SODIUM CHLORIDE 0.9 % IV SOLN
250.0000 mL/h | Freq: Once | INTRAVENOUS | Status: AC
  Administered 2021-03-07: 250 mL/h via INTRAVENOUS
  Filled 2021-03-07 (×2): qty 15

## 2021-03-07 MED ORDER — SODIUM CHLORIDE 0.9 % IV SOLN
12.5000 mg | Freq: Once | INTRAVENOUS | Status: AC
  Administered 2021-03-07: 12.5 mg via INTRAVENOUS
  Filled 2021-03-07: qty 0.5

## 2021-03-07 MED ORDER — POLYETHYLENE GLYCOL 3350 17 G PO PACK
17.0000 g | PACK | Freq: Every day | ORAL | Status: DC
  Administered 2021-03-07 – 2021-03-11 (×5): 17 g via ORAL
  Filled 2021-03-07 (×5): qty 1

## 2021-03-07 MED ORDER — CLONAZEPAM 0.25 MG PO TBDP
0.5000 mg | ORAL_TABLET | Freq: Three times a day (TID) | ORAL | Status: DC
  Administered 2021-03-07 – 2021-03-09 (×7): 0.5 mg via ORAL
  Filled 2021-03-07 (×6): qty 2

## 2021-03-07 MED ORDER — HYDROMORPHONE HCL 1 MG/ML IJ SOLN
1.0000 mg | INTRAMUSCULAR | Status: DC | PRN
  Administered 2021-03-07 – 2021-03-12 (×14): 1 mg via INTRAVENOUS
  Filled 2021-03-07 (×15): qty 1

## 2021-03-07 MED ORDER — BENZOCAINE 10 % MT GEL
Freq: Four times a day (QID) | OROMUCOSAL | Status: DC | PRN
  Filled 2021-03-07: qty 9

## 2021-03-07 NOTE — Progress Notes (Signed)
Report called to [facility], ICU RN.  Patient will be transfer to ICU for Lidocaine infusion.

## 2021-03-07 NOTE — Consult Note (Signed)
[facility]  Date of admission:  03/06/2021  Inpatient day:  03/07/2021  Consulting physician:  Dr Ivor Costa.   Reason for Consultation:  Hx of nasopharyngeal carcinoma with intractable headache since 02/24/2021.  Chief Complaint: Kenneth Franklin is a 75 y.o. 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  HPI:  The patient diagnosed with nasopharyngeal carcinoma on 08/12/2020.  He presented with a 2-month persistent headache primarily frontal behind his eyes and nose which radiated to the occipital region.  Head MRI on 08/13/2020 revealed a 5.9 cm nasopharyngeal mass involvement of the clivus and sphenoid sinus.  He received concurrent chemotherapy and radiation from 09/08/2020-10/21/2020.  He had a difficult time with treatment.  He declined additional adjuvant therapy.  PET scan on 02/22/2021 revealed interval decrease in the size and FDG uptake associated with the mass involving the sphenoid sinus, posterior nasopharynx and skull base.  He was last seen in the medical oncology clinic on 02/24/2021.  At that time he noted a mild headache present for the past week.  We discussed plans for head MRI.  Patient states that over the past 15 days he has had persistent unrelenting severe right frontal headache.  Pain involves only the right side of his face including the right periorbital region, ear, and jaw.  Pain only involves the right side of his nose.  He has been to the emergency room at [facility] and [facility].  He has seen by neurology (Dr Orlie Dakin) in the outpatient department at [facility].  Head MRI at [facility] on 02/26/2021 revealed the nasopharyngeal malignancy was grossly similar in extent with perineural spread along the right V2 nerve which was poorly evaluated due to extensive motion artifact.  He was seen by Dr.Vidwan at [facility] neurology on 03/02/2021.  His headaches did not meet  diagnostic criteria for migraine, tension or cluster headaches.  Differential diagnoses included atypical facial pain, perineural spread along the right V2 region, cervicogenic headache, hemicrania continua vs primary stabbing headache.  Plan was to check a GCA scan to exclude giant cell arteritis.  Gabapentin was to be gradually increased to 300 mg in the morning and 600 mg at night.  He was started on Depakote ER 250 mg and increased to 750 mg over 3 weeks.  Her options included using oxcarbazepine, carbamazepine, lamotrigine, melatonin or Boswellia.  He was seen by Dr. Kerney Elbe, neurologist at [facility] yesterday.  Differential diagnoses included perineural spread of squamous cell carcinoma, temporal arteritis and trigeminal neuralgia.  Differential diagnosis favor trigeminal neuralgia as the patient reported an episode of identical symptoms in the same distribution that lasted for a period of time about 40 years ago.  Symptoms resolved spontaneously.  He recommended pain control with Depakote and Neurontin.  Consideration was made for starting prednisone 50 mg a day but could confound results of a temporal artery biopsy.  Head MRI on 03/06/2021 revealed atrophy and extensive chronic microvascular ischemia.  There was no acute infarct evidence of metastatic disease.  The treated large nasopharyngeal mass showed central fluid intensity and peripheral enhancement filling the nasopharynx extending into the sphenoid sinus and clivus.  Review of imaging with radiology revealed no clear evidence of progressive disease.  Symptomatically, the patient notes his pain is only relieved by IV pain medications for 1 to 1-1/2 hours at a time.   Past Medical History:  Diagnosis Date  . Acute ischemic stroke ([place]) 2017  .  Alcohol abuse   . Anxiety   . Cancer ([place])   . Depression   . Diabetes mellitus without complication ([place])   . Dyspnea    pcp knows and ordered rescue inhaler  . Hyperlipidemia   .  Hypertension   . Skin cancer    Squamous Cell Carcinoma In Situ    Past Surgical History:  Procedure Laterality Date  . CATARACT EXTRACTION Bilateral   . COLONOSCOPY    . NASOPHARYNGOSCOPY N/A 08/12/2020   Procedure: ENDOSCOPIC NASOPHARYNGOSCOPY WITH BIOPSY;  Surgeon: Margaretha Sheffield, MD;  Location: [facility] ORS;  Service: ENT;  Laterality: N/A;  . PORTA CATH INSERTION N/A 08/24/2020   Procedure: PORTA CATH INSERTION;  Surgeon: Algernon Huxley, MD;  Location: [place] [facility];  Service: Cardiovascular;  Laterality: N/A;    Family History  Problem Relation Age of Onset  . Diabetes Brother     Social History:  reports that he quit smoking about 18 months ago. His smoking use included cigars and cigarettes. He has a 40.00 pack-year smoking history. He has never used smokeless tobacco. He reports previous alcohol use. He reports that he does not use drugs.  The patient lives in [place].  He is alone today.  Allergies:  Allergies  Allergen Reactions  . Propoxyphene     Unknown reaction    Medications Prior to Admission  Medication Sig Dispense Refill  . ALPRAZolam (XANAX) 0.25 MG tablet TAKE 1 TABLET EVERY DAY AS NEEDED FOR ANXIETY (Patient taking differently: Take 0.25 mg by mouth daily as needed for anxiety.) 30 tablet 2  . aspirin EC 81 MG tablet Take 81 mg by mouth daily. Swallow whole.    Marland Kitchen atorvastatin (LIPITOR) 80 MG tablet TAKE 1 TABLET (80 MG TOTAL) BY MOUTH NIGHTLY. (Patient taking differently: Take 80 mg by mouth every evening.) 90 tablet 0  . busPIRone (BUSPAR) 10 MG tablet TAKE 1 TABLET (10 MG TOTAL) BY MOUTH 2 (TWO) TIMES DAILY. (Patient taking differently: Take 10 mg by mouth at bedtime.) 180 tablet 0  . clopidogrel (PLAVIX) 75 MG tablet TAKE 1 TABLET (75 MG TOTAL) BY MOUTH DAILY. 90 tablet 0  . DENTA 5000 PLUS 1.1 % CREA dental cream Place 1 application onto teeth at bedtime.     . divalproex (DEPAKOTE ER) 250 MG 24 hr tablet Take 250-750 mg by mouth See admin instructions.  Take 1 tablet (250mg ) by mouth at bedtime for 7 nights, then take 2 tablets (500mg ) by mouth at bedtime for 7 nights then take 3 tablets (750mg ) by mouth at bedtime    . escitalopram (LEXAPRO) 20 MG tablet Take 20 mg by mouth every morning.     . famotidine (PEPCID) 40 MG tablet Take 1 tablet (40 mg total) by mouth every evening. 30 tablet 1  . feeding supplement (ENSURE ENLIVE / ENSURE PLUS) LIQD Take 237 mLs by mouth 3 (three) times daily between meals. 237 mL 12  . fluticasone (FLOVENT HFA) 110 MCG/ACT inhaler Inhale 2 puffs into the lungs daily as needed (shortness of breath).    Marland Kitchen glipiZIDE (GLUCOTROL) 5 MG tablet Take 5 mg by mouth daily before breakfast.     . HYDROcodone-acetaminophen (NORCO) 5-325 MG tablet Take 1 tablet by mouth every 6 (six) hours as needed for moderate pain. 20 tablet 0  . lidocaine-prilocaine (EMLA) cream Apply 1 application topically as needed. Apply small amount to port site at least 1 hour prior to it being accessed, cover with plastic wrap 30 g 1  .  losartan (COZAAR) 100 MG tablet TAKE 1 TABLET (100 MG TOTAL) BY MOUTH DAILY. (Patient taking differently: Take 100 mg by mouth every morning.) 90 tablet 0  . metFORMIN (GLUCOPHAGE-XR) 500 MG 24 hr tablet Take 500 mg by mouth daily.    . methocarbamol (ROBAXIN) 500 MG tablet Take 1 tablet (500 mg total) by mouth every 6 (six) hours as needed for muscle spasms (breakthrough pain). 30 tablet 0  . mirtazapine (REMERON) 30 MG tablet Take 1 tablet (30 mg total) by mouth at bedtime. (Patient taking differently: Take 15 mg by mouth at bedtime as needed.) 30 tablet 0  . polyvinyl alcohol (LIQUIFILM TEARS) 1.4 % ophthalmic solution Place 1 drop into both eyes as needed for dry eyes. 15 mL 0  . sodium chloride (OCEAN) 0.65 % SOLN nasal spray Place 1 spray into both nostrils as needed for congestion.  0  . SUMAtriptan (IMITREX) 25 MG tablet Take 25 mg by mouth 2 (two) times daily as needed for migraine.    . gabapentin (NEURONTIN) 300  MG capsule Take 300-600 mg by mouth See admin instructions. Take 2 capsules (600mg ) by mouth at bedtime for 7 days then take 1 capsule (300mg ) by mouth in the morning and 2 capsules (600mg ) by mouth at night      Review of Systems  Constitutional: Positive for malaise/fatigue. Negative for chills, diaphoresis, fever and weight loss.  HENT: Negative for congestion, ear pain, hearing loss, nosebleeds, sinus pain, sore throat and tinnitus.   Eyes: Negative.  Negative for blurred vision, double vision, photophobia and pain.  Respiratory: Negative.  Negative for cough, sputum production, shortness of breath and wheezing.   Cardiovascular: Negative.  Negative for chest pain, palpitations, orthopnea and leg swelling.  Gastrointestinal: Negative.  Negative for abdominal pain, blood in stool, constipation, diarrhea, heartburn, melena, nausea and vomiting.  Genitourinary: Negative.  Negative for dysuria, frequency and urgency.  Musculoskeletal: Negative.  Negative for back pain, joint pain, myalgias and neck pain.  Skin: Negative.  Negative for itching and rash.  Neurological: Positive for headaches (severe, persistent). Negative for dizziness, tingling, tremors, sensory change, speech change, focal weakness and seizures.  Endo/Heme/Allergies: Negative.  Negative for environmental allergies. Does not bruise/bleed easily.  Psychiatric/Behavioral: Negative for depression and memory loss. The patient has insomnia (poor sleep secondary to pain). The patient is not nervous/anxious.   All other systems reviewed and are negative.   Vitals:  Blood pressure 121/61, pulse 65, temperature 97.9 F (36.6 C), temperature source Oral, resp. rate 17, height 6' (1.829 m), weight 135 lb (61.2 kg), SpO2 96 %.   Physical Exam Vitals and nursing note reviewed.  Constitutional:      General: He is in acute distress.     Appearance: He is ill-appearing. He is not diaphoretic.     Comments: Patient yelling out in pain  periodically.  HENT:     Head: Atraumatic.     Mouth/Throat:     Mouth: Mucous membranes are moist.     Pharynx: Oropharynx is clear.  Eyes:     General: No scleral icterus.    Extraocular Movements: Extraocular movements intact.     Pupils: Pupils are equal, round, and reactive to light. Pupils are equal.  Cardiovascular:     Rate and Rhythm: Normal rate and regular rhythm.  Abdominal:     General: There is no distension.     Palpations: Abdomen is soft. There is no mass.     Tenderness: There is no abdominal tenderness.  There is no guarding.  Musculoskeletal:        General: No swelling or tenderness. Normal range of motion.     Cervical back: Normal range of motion and neck supple.  Lymphadenopathy:     Cervical: No cervical adenopathy.  Skin:    General: Skin is dry.     Coloration: Skin is not cyanotic or pale.     Findings: No erythema or rash.  Neurological:     Mental Status: He is alert and oriented to person, place, and time. Mental status is at baseline.     Cranial Nerves: No cranial nerve deficit, dysarthria or facial asymmetry.     Comments: Facial sensation intact.  No apparent decreased NLF. Left palpebral fissure slightly decreased.  Psychiatric:        Mood and Affect: Mood normal. Mood is not anxious or depressed.        Speech: Speech normal.        Behavior: Behavior is agitated.     Results for orders placed or performed during the hospital encounter of 03/06/21 (from the past 48 hour(s))  SARS CORONAVIRUS 2 (TAT 6-24 HRS) Nasopharyngeal Nasopharyngeal Swab     Status: None   Collection Time: 03/06/21  9:35 AM   Specimen: Nasopharyngeal Swab  Result Value Ref Range   SARS Coronavirus 2 NEGATIVE NEGATIVE    Comment: (NOTE) SARS-CoV-2 target nucleic acids are NOT DETECTED.  The SARS-CoV-2 RNA is generally detectable in upper and lower respiratory specimens during the acute phase of infection. Negative results do not preclude SARS-CoV-2 infection, do  not rule out co-infections with other pathogens, and should not be used as the sole basis for treatment or other patient management decisions. Negative results must be combined with clinical observations, patient history, and epidemiological information. The expected result is Negative.  Fact Sheet for Patients: SugarRoll.be  Fact Sheet for Healthcare Providers: https://www.woods-mathews.com/  This test is not yet approved or cleared by the [place] FDA and  has been authorized for detection and/or diagnosis of SARS-CoV-2 by FDA under an Emergency Use Authorization (EUA). This EUA will remain  in effect (meaning this test can be used) for the duration of the COVID-19 declaration under Se ction 564(b)(1) of the Act, 21 U.S.C. section 360bbb-3(b)(1), unless the authorization is terminated or revoked sooner.  Performed at [facility], [place] [address], [place] [address]   CBC     Status: Abnormal   Collection Time: 03/06/21 10:36 AM  Result Value Ref Range   WBC 11.8 (H) 4.0 - 10.5 K/uL   RBC 3.80 (L) 4.22 - 5.81 MIL/uL   Hemoglobin 11.2 (L) 13.0 - 17.0 g/dL   HCT 34.7 (L) 39.0 - 52.0 %   MCV 91.3 80.0 - 100.0 fL   MCH 29.5 26.0 - 34.0 pg   MCHC 32.3 30.0 - 36.0 g/dL   RDW 13.5 11.5 - 15.5 %   Platelets 326 150 - 400 K/uL   nRBC 0.0 0.0 - 0.2 %    Comment: Performed at [facility], [address] [place], [place] [address]  Basic metabolic panel     Status: Abnormal   Collection Time: 03/06/21 10:36 AM  Result Value Ref Range   Sodium 133 (L) 135 - 145 mmol/L   Potassium 4.9 3.5 - 5.1 mmol/L   Chloride 96 (L) 98 - 111 mmol/L   CO2 28 22 - 32 mmol/L   Glucose, Bld 251 (H) 70 - 99 mg/dL  Comment: Glucose reference range applies only to samples taken after fasting for at least 8 hours.   BUN 26 (H) 8 - 23 mg/dL   Creatinine, Ser 0.91 0.61 - 1.24 mg/dL   Calcium 9.8 8.9 - 10.3 mg/dL   GFR, Estimated  >60 >60 mL/min    Comment: (NOTE) Calculated using the CKD-EPI Creatinine Equation (2021)    Anion gap 9 5 - 15    Comment: Performed at [facility], [address] [place], [place] [address]  Sedimentation rate     Status: Abnormal   Collection Time: 03/06/21 10:36 AM  Result Value Ref Range   Sed Rate 78 (H) 0 - 20 mm/hr    Comment: Performed at [facility], [address] [place], [place] [address]  C-reactive protein     Status: Abnormal   Collection Time: 03/06/21 10:36 AM  Result Value Ref Range   CRP 2.1 (H) <1.0 mg/dL    Comment: Performed at [place] [address], [place], [place] [address]  Glucose, capillary     Status: Abnormal   Collection Time: 03/06/21 11:53 AM  Result Value Ref Range   Glucose-Capillary 241 (H) 70 - 99 mg/dL    Comment: Glucose reference range applies only to samples taken after fasting for at least 8 hours.  Protime-INR     Status: None   Collection Time: 03/06/21  4:06 PM  Result Value Ref Range   Prothrombin Time 12.5 11.4 - 15.2 seconds   INR 1.0 0.8 - 1.2    Comment: (NOTE) INR goal varies based on device and disease states. Performed at [facility], [address] [place], [place] [address]   Glucose, capillary     Status: Abnormal   Collection Time: 03/06/21  4:19 PM  Result Value Ref Range   Glucose-Capillary 278 (H) 70 - 99 mg/dL    Comment: Glucose reference range applies only to samples taken after fasting for at least 8 hours.  Glucose, capillary     Status: Abnormal   Collection Time: 03/06/21  8:01 PM  Result Value Ref Range   Glucose-Capillary 134 (H) 70 - 99 mg/dL    Comment: Glucose reference range applies only to samples taken after fasting for at least 8 hours.  Basic metabolic panel     Status: Abnormal   Collection Time: 03/07/21  5:59 AM  Result Value Ref Range   Sodium 135 135 - 145 mmol/L   Potassium 3.9 3.5 - 5.1 mmol/L   Chloride 97 (L) 98 - 111 mmol/L   CO2 30 22  - 32 mmol/L   Glucose, Bld 145 (H) 70 - 99 mg/dL    Comment: Glucose reference range applies only to samples taken after fasting for at least 8 hours.   BUN 24 (H) 8 - 23 mg/dL   Creatinine, Ser 0.76 0.61 - 1.24 mg/dL   Calcium 9.7 8.9 - 10.3 mg/dL   GFR, Estimated >60 >60 mL/min    Comment: (NOTE) Calculated using the CKD-EPI Creatinine Equation (2021)    Anion gap 8 5 - 15    Comment: Performed at [facility], [address] [place], [place] [address]  CBC     Status: Abnormal   Collection Time: 03/07/21  5:59 AM  Result Value Ref Range   WBC 8.6 4.0 - 10.5 K/uL   RBC 3.93 (L) 4.22 - 5.81 MIL/uL   Hemoglobin 11.6 (L) 13.0 - 17.0 g/dL   HCT 35.4 (L)  39.0 - 52.0 %   MCV 90.1 80.0 - 100.0 fL   MCH 29.5 26.0 - 34.0 pg   MCHC 32.8 30.0 - 36.0 g/dL   RDW 13.4 11.5 - 15.5 %   Platelets 310 150 - 400 K/uL   nRBC 0.0 0.0 - 0.2 %    Comment: Performed at [facility], [address], [place], [place] [address]  Magnesium     Status: None   Collection Time: 03/07/21  5:59 AM  Result Value Ref Range   Magnesium 2.0 1.7 - 2.4 mg/dL    Comment: Performed at [facility], [address] [place], [place] [address]  Glucose, capillary     Status: Abnormal   Collection Time: 03/07/21  8:17 AM  Result Value Ref Range   Glucose-Capillary 150 (H) 70 - 99 mg/dL    Comment: Glucose reference range applies only to samples taken after fasting for at least 8 hours.  Glucose, capillary     Status: Abnormal   Collection Time: 03/07/21 11:30 AM  Result Value Ref Range   Glucose-Capillary 251 (H) 70 - 99 mg/dL    Comment: Glucose reference range applies only to samples taken after fasting for at least 8 hours.   MR BRAIN W WO CONTRAST  Result Date: 03/06/2021 CLINICAL DATA:  Headache. History of nasopharyngeal carcinoma post treatment. EXAM: MRI HEAD WITHOUT AND WITH CONTRAST TECHNIQUE: Multiplanar, multiecho pulse sequences of the brain and surrounding structures were  obtained without and with intravenous contrast. CONTRAST:  88mL GADAVIST GADOBUTROL 1 MMOL/ML IV SOLN COMPARISON:  CT head 02/24/2021.  MRI head 08/13/2020 FINDINGS: Brain: Moderate generalized atrophy. Ventricular enlargement chronic and unchanged from prior studies. Extensive white matter hyperintensity bilaterally. Mild patchy hyperintensity in the pons. Negative for acute infarct, hemorrhage, mass. Negative for metastatic disease to the brain. Vascular: Normal arterial flow voids. Skull and upper cervical spine: Abnormal skull base described below. Otherwise negative calvarium. Sinuses/Orbits: Large nasopharyngeal mass on the prior study now shows central necrosis and peripheral irregular enhancement. This extends throughout the sphenoid sinus and into the clivus. This is the initial post treatment study and continued follow-up is warranted. Bilateral mastoid effusion. Bilateral cataract extraction Other: None IMPRESSION: Atrophy and extensive chronic microvascular ischemia. No acute infarct negative for metastatic disease to the brain Treated large nasopharyngeal mass now shows central fluid intensity and peripheral enhancement filling the nasopharynx extending into the sphenoid sinus and clivus. Continued imaging follow-up recommended. Electronically Signed   By: Franchot Gallo M.D.   On: 03/06/2021 13:59    Assessment:  The patient is a 75 y.o.  gentleman with stage III nasopharyngeal carcinoma s/p concurrent chemotherapy and radiation (completed 10/21/2020) who is admitted through the emergency room with intractable headache.  He was admitted with intractable headache x 15 days.  Pain is unresponsive to outpatient management.  Head MRI on 08/13/2020 revealed a 5.9 cm nasopharyngeal mass involvement of the clivus and sphenoid sinus.  PET scan on 02/22/2021 revealed interval decrease in the size and FDG uptake associated with the mass involving the sphenoid sinus, posterior nasopharynx and skull  base.  Head MRI on 03/06/2021 revealed atrophy and extensive chronic microvascular ischemia.  There was no acute infarct evidence of metastatic disease.  The treated large nasopharyngeal mass showed central fluid intensity and peripheral enhancement filling the nasopharynx extending into the sphenoid sinus and clivus.  Review of imaging with radiology revealed no clear evidence of progressive disease.  Symptomatically, his pain is only relived for 1-1.5  hours after IV pain medication.  Plan:   1.   Stage III nasopharyngeal carcinoma  Patient completed current cisplatin and radiation on 10/21/2020.  He did not receive additional adjuvant chemotherapy.  He initially presented with his diagnosis with a headache.  Review of yesterday's head MRI reveals no apparent progression from imaging from 08/13/2020. 2.   Intractable headache  Differential diagnosis includes perineural spread of squamous cell carcinoma, temporal arteritis and trigeminal neuralgia.   Sed rate was 78 (0-20) and CRP 2.1 (< 1.0).  Patient currently on Depakote, gabapentin, oxycodone, prednisone (50 mg a day) and IV Dilaudid.  Patient only notes pain only relieved with IV pain medication.  Consider PCA.  Plan to review imaging with neurosurgery tomorrow.  Thank you for allowing me to participate in ELRIC TIRADO 's care.  I will follow him closely with you while hospitalized and after discharge in the outpatient department.   Lequita Asal, MD  03/07/2021, 12:11 PM

## 2021-03-07 NOTE — Progress Notes (Addendum)
PROGRESS NOTE    Kenneth Franklin   ZDG:[id number]  DOB: 1946/11/18  PCP: Verita Lamb, NP    DOA: 03/06/2021 LOS: 1   Brief Narrative   Kenneth Franklin is a 75 y.o. 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  He had previously sought treatment in ED at [facility] and here at [facility] during this time, daughter reported 5 ED visits in the prior 10 days.  Patient has nasopharyngeal cancer, diagnosed in September 2021 after presenting with a 72-monthpersistent headache.  MRI showed a 5.9 cm nasopharyngeal mass involving the clivus and sphenoid sinus.  He underwent chemo and radiation therapy, finished on 10/21/2020.  Reportedly had a difficult time with treatment in declined further adjuvant therapy.  He had PET scan 02/22/2021 showing interval decrease in the size and FDG uptake of the mass.  [facility]  Gabapentin was increased and he was started on Depakote with plans to exclude giant cell arteritis.  It was felt headaches could be related to be to perineural spread of his malignancy.  The presentation does not meet criteria migraine tension or cluster headache.  MRI here on 03/06/2021 revealed atrophy and extensive chronic microvascular ischemia, no evidence of acute infarct, no evidence of metastatic disease.  The nasopharyngeal mass showed central fluid intensity and peripheral enhancement filling the nasopharynx extending into the sphenoid sinus and clivus.  There was no clear evidence of progressive disease however.  Admitted to hospitalist service with Neurology consulted.  Oncology is following as well.    4/10 Patient continues to have intractable pain.  Given Klonipin ODT and IV Phenergan this AM and was able to rest.  Given the distribution of pain and above history with structural cause for trigeminal neuralgia syndrome,  this seems most likely.  Giant cell arteritis/temporal arteritis remains in the differential however.    Patient received IV lidocaine infusion on 4/10 for both diagnostic and therapeutic purposes.  If good response, trigeminal neuralgia is most likely, and temporal artery biopsy may not be required.  We would hope to avoid biopsy if possible, as TN pain would likely be further exacerbated.     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  See above summary for history.   TA/GCA remains in differential, but TN seems more likely.  ESR/CRP are up but nonspecific. 4/10 pt writhing and moaning in pain when seen early AM, given Klonopin ODT and IV Phenergan, was subsequently resting comfortably during infusion PLAN: --Neurology and Oncology following --received IV lidocaine infusion in stepdown unit --transfer back to med/surg if stable 1 hr after infusion complete --Continue scheduled Klonopin TID for now --Consider IV Phenergan to help rest if needed --IV Dilaudid for severe pain, will consider PCA if needed --Percocet [id number] q4h PRN --Continue steroids, may affect TA biopsy results  --Continue gabapentin --Continue Depakote (consider change to Tegretol) --If lidocaine  infusion helpful, will refer to Dr. Holley Raring in pain management for outpatient infusions --Stop Imitrex, no indication  Nasopharyngeal carcinoma -follows Dr. Mike Gip.  Completed chemoradiation.  See her consult note of today for  detailed history.   Suspect this to be causing trigeminal neuralgia syndrome with intractable headache / facial pain. --Dr. Mike Gip is following  Reduced appetite due to loss of taste -due to radiation therapy. Weight loss -due to above.  Patient reported a recent neurology visit that he has lost 30 pounds or more since radiation. --Dietitian consult --Consider appetite stimulant however with lack of taste this may not be helpful  Hypertension -continue home losartan.  IV hydralazine as needed.  BP elevation may be related to pain.  Monitor  COPD -stable, without acute exacerbation.  Continue as needed albuterol and Mucinex.  Type 2 diabetes -poorly controlled, recent A1c 8.9%.  Takes Metformin and glipizide which are held. --Sliding scale NovoLog  History of stroke -continue aspirin Plavix and Lipitor  Depression with anxiety -continue home meds  GERD -continue Pepcid  Hyperlipidemia -continue Lipitor   History of alcohol use -in remission   Patient BMI: Body mass index is 18.31 kg/m.   DVT prophylaxis: heparin injection 5,000 Units Start: 03/06/21 1400   Diet:  Diet Orders (From admission, onward)    Start     Ordered   03/06/21 1232  Diet heart healthy/carb modified Room service appropriate? Yes; Fluid consistency: Thin  Diet effective now       Question Answer Comment  Diet-HS Snack? Nothing   Room service appropriate? Yes   Fluid consistency: Thin      03/06/21 1231            Code Status: Full Code    Subjective 03/08/21    Patient crying and moaning, writhing in pain on right side of his head.  States no relief from prior medications tried.  Asks if there is anything that will take away his pain, says "I can't go on like this".   Disposition Plan & Communication   Status is: Inpatient  Inpatient status appropriate due to intractable pain, requiring IV medications, pain uncontrolled, ongoing evaluation.    Dispo: The patient is from: home               Anticipated d/c is to: home              Patient currently not medically stable for d/c.   Difficult to place patient no    Family Communication: Spoke to patient's stepdaughter, Ernestina Patches, by phone this afternoon.     Consults, Procedures, Significant Events   Consultants:   Neurology  Oncology  Procedures:   IV Lidocaine Infusion 4/10  Antimicrobials:  Anti-infectives (From admission, onward)   None        Micro    Objective   Vitals:   03/07/21 1504 03/07/21 2107 03/08/21 0138 03/08/21 0507  BP: 135/66 (!) 145/76 135/69 (!) 148/69  Pulse: 62 68 61 (!) 55  Resp: '15 20 20 15  ' Temp:  98.5 F (36.9 C) 98 F (36.7 C) 98.2 F (36.8 C)  TempSrc:  Oral Oral   SpO2: 95% 98% 94% 96%  Weight:      Height:        Intake/Output Summary (Last 24 hours) at 03/08/2021 0732 Last data filed at 03/07/2021 1929 Gross per 24 hour  Intake 120 ml  Output 300 ml  Net -180 ml   Filed Weights   03/06/21 0321  Weight: 61.2 kg    Physical Exam:  General exam: awake, alert, in severe distress due to pain HEENT: keeps eyes closed, conjunctival injection R>L, moist mucus membranes, hearing grossly normal  Respiratory system: exam limited by pt's voice moaning, normal respiratory effort, generally clear, unable to hear any wheezing rhonchi or crackles. Cardiovascular system: normal S1/S2, RRR, no pedal edema.   Central nervous system: no gross focal neurologic deficits, normal speech Extremities: moves all, no edema, normal tone Psychiatry: normal mood, congruent affect, judgement and insight appear normal  Labs   Data Reviewed: I have personally reviewed following labs and imaging studies  CBC: Recent Labs  Lab 03/03/21 1154 03/06/21 1036 03/07/21 0559  WBC 8.0 11.8* 8.6  HGB 13.1 11.2* 11.6*  HCT 40.5 34.7* 35.4*  MCV 91.8 91.3 90.1  PLT 332 326 767   Basic Metabolic Panel: Recent Labs  Lab 03/03/21 1154 03/06/21 1036 03/07/21 0559  NA 139 133* 135   K 4.4 4.9 3.9  CL 102 96* 97*  CO2 '28 28 30  ' GLUCOSE 184* 251* 145*  BUN 15 26* 24*  CREATININE 0.78 0.91 0.76  CALCIUM 9.9 9.8 9.7  MG  --   --  2.0   GFR: Estimated Creatinine Clearance: 72.3 mL/min (by C-G formula based on SCr of 0.76 mg/dL). Liver Function Tests: No results for input(s): AST, ALT, ALKPHOS, BILITOT, PROT, ALBUMIN in the last 168 hours. No results for input(s): LIPASE, AMYLASE in the last 168 hours. No results for input(s): AMMONIA in the last 168 hours. Coagulation Profile: Recent Labs  Lab 03/06/21 1606  INR 1.0   Cardiac Enzymes: No results for input(s): CKTOTAL, CKMB, CKMBINDEX, TROPONINI in the last 168 hours. BNP (last 3 results) No results for input(s): PROBNP in the last 8760 hours. HbA1C: No results for input(s): HGBA1C in the last 72 hours. CBG: Recent Labs  Lab 03/06/21 2001 03/07/21 0817 03/07/21 1130 03/07/21 1554 03/07/21 2106  GLUCAP 134* 150* 251* 237* 232*   Lipid Profile: No results for input(s): CHOL, HDL, LDLCALC, TRIG, CHOLHDL, LDLDIRECT in the last 72 hours. Thyroid Function Tests: No results for input(s): TSH, T4TOTAL, FREET4, T3FREE, THYROIDAB in the last 72 hours. Anemia Panel: No results for input(s): VITAMINB12, FOLATE, FERRITIN, TIBC, IRON, RETICCTPCT in the last 72 hours. Sepsis Labs: No results for input(s): PROCALCITON, LATICACIDVEN in the last 168 hours.  Recent Results (from the past 240 hour(s))  SARS CORONAVIRUS 2 (TAT 6-24 HRS) Nasopharyngeal Nasopharyngeal Swab     Status: None   Collection Time: 03/06/21  9:35 AM   Specimen: Nasopharyngeal Swab  Result Value Ref Range Status   SARS Coronavirus 2 NEGATIVE NEGATIVE Final    Comment: (NOTE) SARS-CoV-2 target nucleic acids are NOT DETECTED.  The SARS-CoV-2 RNA is generally detectable in upper and lower respiratory specimens during the acute phase of infection. Negative results do not preclude SARS-CoV-2 infection, do not rule out co-infections with other  pathogens, and should not be used as the sole basis for treatment or other patient management decisions. Negative results must be combined with clinical observations, patient history, and epidemiological information. The expected result is Negative.  Fact Sheet for Patients: SugarRoll.be  Fact Sheet for Healthcare Providers: https://www.woods-mathews.com/  This test is not yet approved or cleared by the [place] FDA and  has been authorized for detection and/or diagnosis of SARS-CoV-2 by FDA under an Emergency Use Authorization (EUA). This EUA will remain  in effect (meaning this test can be used) for the duration of  the COVID-19 declaration under Se ction 564(b)(1) of the Act, 21 U.S.C. section 360bbb-3(b)(1), unless the authorization is terminated or revoked sooner.  Performed at [facility], [place] [address] [place], [place] [address]       Imaging Studies   MR BRAIN W WO CONTRAST  Result Date: 03/06/2021 CLINICAL DATA:  Headache. History of nasopharyngeal carcinoma post treatment. EXAM: MRI HEAD WITHOUT AND WITH CONTRAST TECHNIQUE: Multiplanar, multiecho pulse sequences of the brain and surrounding structures were obtained without and with intravenous contrast. CONTRAST:  16m GADAVIST GADOBUTROL 1 MMOL/ML IV SOLN COMPARISON:  CT head 02/24/2021.  MRI head 08/13/2020 FINDINGS: Brain: Moderate generalized atrophy. Ventricular enlargement chronic and unchanged from prior studies. Extensive white matter hyperintensity bilaterally. Mild patchy hyperintensity in the pons. Negative for acute infarct, hemorrhage, mass. Negative for metastatic disease to the brain. Vascular: Normal arterial flow voids. Skull and upper cervical spine: Abnormal skull base described below. Otherwise negative calvarium. Sinuses/Orbits: Large nasopharyngeal mass on the prior study now shows central necrosis and peripheral irregular enhancement. This extends  throughout the sphenoid sinus and into the clivus. This is the initial post treatment study and continued follow-up is warranted. Bilateral mastoid effusion. Bilateral cataract extraction Other: None IMPRESSION: Atrophy and extensive chronic microvascular ischemia. No acute infarct negative for metastatic disease to the brain Treated large nasopharyngeal mass now shows central fluid intensity and peripheral enhancement filling the nasopharynx extending into the sphenoid sinus and clivus. Continued imaging follow-up recommended. Electronically Signed   By: CFranchot GalloM.D.   On: 03/06/2021 13:59     Medications   Scheduled Meds: . aspirin EC  81 mg Oral Daily  . atorvastatin  80 mg Oral QPM  . busPIRone  10 mg Oral QHS  . clonazepam  0.5 mg Oral TID  . clopidogrel  75 mg Oral Daily  . divalproex  250 mg Oral QHS  . [START ON 03/13/2021] divalproex  500 mg Oral QHS  . [START ON 03/20/2021] divalproex  750 mg Oral QHS  . escitalopram  20 mg Oral Daily  . famotidine  40 mg Oral QPM  . feeding supplement  237 mL Oral TID BM  . [START ON 03/13/2021] gabapentin  300 mg Oral Q0600  . gabapentin  600 mg Oral QHS  . heparin  5,000 Units Subcutaneous Q8H  . insulin aspart  0-5 Units Subcutaneous QHS  . insulin aspart  0-9 Units Subcutaneous TID WC  . losartan  100 mg Oral Daily  . polyethylene glycol  17 g Oral Daily  . predniSONE  50 mg Oral Q breakfast   Continuous Infusions: . promethazine (PHENERGAN) injection (IM or IVPB)         LOS: 1 day    Time spent: 55 minutes with > 50% spent in coordination of care and at bedside.    KEzekiel Slocumb DO [facility]  03/08/2021, 7:32 AM      If 7PM-7AM, please contact night-coverage. How to contact the [facility] A Medical CorporationAttending or Consulting provider [place] covering provider during after hours [place] for this patient?    1. Check the care team in [facility]and look for a) attending/consulting TRH provider listed and b) the [facility] Hospitalteam  listed 2. Log into [email or web address] and use Naomi's universal password to access. If you do not have the password, please contact the hospital operator. 3. Locate the [facility] Hospitalprovider you are looking for under [facility] and page to a number that you can be  directly reached. 4. If you still have difficulty reaching the provider, please page the [facility] (Director on Call) for the Hospitalists listed on amion for assistance.

## 2021-03-07 NOTE — Progress Notes (Signed)
NP Kenneth Franklin made aware that pt continues to complain of headache 10/10 and almost crying in pain despite getting all PRN pain meds available, one time dose dilaudid ordered

## 2021-03-07 NOTE — Progress Notes (Signed)
Pt continued to complain of severe head pains throughout night despite administration of multiple PRN meds, pt states that "something must be done to help with pain, I cant take it anymore", per pt only slight relief with PRN meds, will report to next shift to make attending MD aware

## 2021-03-07 NOTE — Hospital Course (Addendum)
75 y.o. 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  nasopharyngeal cancer since September 2021 after presenting with a 65-month persistent headache.  chemo and radiation therapy, finished on 10/21/2020--declined further adjuvant therapy.  He had PET scan 02/22/2021 showing interval decrease in the size and FDG uptake of the mass.  [facility] neurology evaluated 03/02/2021--increased gabapentin, added Depakote (?  Giant cell arteritis) headaches DDx perineural spread of his malignancy.   MRI here on 03/06/2021 revealed no clear evidence of progressive disease however.  Admitted to hospitalist service with Neurology consulted.  Oncology is following as well.    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  Giant cell arteritis/temporal arteritis remains in the differential however.  Ultrasound of temporal artery to look for halo sign is planned per neurology.  Patient received IV lidocaine infusion on 4/10 for both diagnostic and therapeutic purposes.  If good response, trigeminal neuralgia is most likely, and temporal artery biopsy may not be required.  We would hope to avoid biopsy if possible, as TN pain would likely be further exacerbated.    IMPRESSION: Normal appearance of the bilateral temporal arteries with exception of mild atherosclerotic disease. No evidence for giant cell arteritis by sonography.IMPRESSION: Normal appearance of the bilateral temporal arteries with exception of mild atherosclerotic disease. No evidence for giant cell arteritis by sonography.  Discussed with Dr. Deneen Harts of Radiology Based on MRI and PET's and recent MRIs'  Looks like large Ca in  Clivus and sphenoid sinus--unclear origin--it looked pretty evident uptake Post Rx decreased uptake There recent MRI on 4/9 shows necrotic tumour --there is concern for peri-neural spread, or nerve injury Might require a skull based MRI in the trigeminal nerve distribution--skull based MRI  MRI maxillo with

## 2021-03-07 NOTE — Progress Notes (Signed)
1050: Patient arrived in the unit via bed. His eyes were close. He is oriented x 4. He said his pain is 10/10 constant on his R temporal radiating to his ear and back of the head.   Morphine IV prn given while waiting for the Lidocaine drip.   Clarified with Dr. Laurann Montana about the Lidocaine order. Patient to get the whole 300 mg/ 500 ml bag. She wanted it to infuse for 1 hr at 500 ml/hr but the hard guardrail is set for 250 ml/hr on this specific medication.   MD at bedside and was made aware.

## 2021-03-07 NOTE — Progress Notes (Signed)
1330: Transfer out report given to [facility]. Patient transferred back to room 116.   He said his pain is now 5/10 from 10/10 earlier. Receiving RN notified.

## 2021-03-08 DIAGNOSIS — C119 Malignant neoplasm of nasopharynx, unspecified: Secondary | ICD-10-CM

## 2021-03-08 DIAGNOSIS — G500 Trigeminal neuralgia: Principal | ICD-10-CM

## 2021-03-08 DIAGNOSIS — R519 Headache, unspecified: Secondary | ICD-10-CM | POA: Diagnosis not present

## 2021-03-08 LAB — GLUCOSE, CAPILLARY
Glucose-Capillary: 173 mg/dL — ABNORMAL HIGH (ref 70–99)
Glucose-Capillary: 203 mg/dL — ABNORMAL HIGH (ref 70–99)
Glucose-Capillary: 280 mg/dL — ABNORMAL HIGH (ref 70–99)
Glucose-Capillary: 204 mg/dL — ABNORMAL HIGH (ref 70–99)

## 2021-03-08 MED ORDER — SODIUM CHLORIDE 0.9 % IV SOLN
10.0000 mg/kg | Freq: Once | INTRAVENOUS | Status: AC
  Administered 2021-03-08: 19:00:00 612 mg via INTRAVENOUS
  Filled 2021-03-08: qty 12.24

## 2021-03-08 MED ORDER — ADULT MULTIVITAMIN W/MINERALS CH
1.0000 | ORAL_TABLET | Freq: Every day | ORAL | Status: DC
  Administered 2021-03-09 – 2021-03-13 (×4): 1 via ORAL
  Filled 2021-03-08 (×4): qty 1

## 2021-03-08 NOTE — Progress Notes (Signed)
NEUROLOGY CONSULTATION PROGRESS NOTE   Date of service: March 08, 2021 Patient Name: Kenneth Franklin MRN:  [id number] DOB:  1946/11/12  Brief HPI  Kenneth Franklin is a 75 y.o. male with intractable R sided facial pain involving face with hx of Nasopharyngeal Carcinoma treated with radiation and chemotherapy(completed Nov 2021) who p/w 2 months hx of new onset headache predominantly in Right V2 distribution but goes behind ear, temple and outer aspect of his R eye socket. He has a dull ache with brief sharp shooting pain.  Has been seen in ED a few times over the last week. MRI Brain with and without contrast with grossly similar nasopharyngeal malignancy with perineural spead along the R V2 nerve. He was seen in Neurology clinic at [facility] and etiology was unclear. Differential includes atypical facial pain (perineural spread along the right V2 region, poorly evaluated due to motion artifact), cervicogenic headache, hemicrania continua, versus primary stabbing headache.  Gabepentin was increased to 371m in AM and 607mqhs and Depakote 2503mR was started with plan to increase to 750m66mily over 3 weeks.  He presented to ED with a 20/10 headache with no improvement in symptoms. He was given IV Dilaudid, Toradol, Decadron with recurrent peaks of facial pain, some precipitated by touch. ESR elevated to 80. Repeat MRI Brain here is stable.   Interval Hx   He reports that pain is about a 6/10 this AM, and a 8/10 in the afternoon when I saw him. He slept through the morning. Eating dinner when I saw him. Reports he had 5 peaks today.  Vitals   Vitals:   03/08/21 0507 03/08/21 0900 03/08/21 1216 03/08/21 1550  BP: (!) 148/69 (!) 97/56 (!) 151/77 (!) 147/79  Pulse: (!) 55 (!) 57 63 63  Resp: '15 16 15 20  ' Temp: 98.2 F (36.8 C) 98 F (36.7 C) 97.7 F (36.5 C) 98 F (36.7 C)  TempSrc:   Oral   SpO2: 96% 94% 97% 97%  Weight:      Height:         Body mass index is 18.31 kg/m.  Physical  Exam   General: Laying comfortably in bed; in no acute distress.  HENT: Normal oropharynx and mucosa. Normal external appearance of ears and nose.  Neck: Supple, no pain or tenderness  CV: No JVD. No peripheral edema.  Pulmonary: Symmetric Chest rise. Normal respiratory effort.  Abdomen: Soft to touch, non-tender.  Ext: No cyanosis, edema, or deformity  Skin: No rash. Normal palpation of skin.   Musculoskeletal: Normal digits and nails by inspection. No clubbing.   Neurologic Examination  Mental status/Cognition: Alert, oriented to self, place, month and year, good attention. Speech/language: Fluent, comprehension intact, object naming intact, repetition intact.  Cranial nerves:   CN II Pupils equal and reactive to light, no VF deficits    CN III,IV,VI EOM intact, no gaze preference or deviation, no nystagmus    CN V normal sensation in V1, V2, and V3 segments bilaterally    CN VII Mild flattening of nasolabila fold on the Right.   CN VIII normal hearing to speech    CN IX & X normal palatal elevation, no uvular deviation    CN XI 5/5 head turn and 5/5 shoulder shrug bilaterally    CN XII midline tongue protrusion    Motor:  Muscle bulk: poor RUE and LUE: 5/5 to hand grip Wiggles toes and lifts p both legs off the bed on command.  Sensation:  Light touch Intact throughout   Pin prick    Temperature    Vibration   Proprioception    Coordination/Complex Motor:  - Finger to Nose intact BL  Labs   Basic Metabolic Panel:  Lab Results  Component Value Date   NA 135 03/07/2021   K 3.9 03/07/2021   CO2 30 03/07/2021   GLUCOSE 145 (H) 03/07/2021   BUN 24 (H) 03/07/2021   CREATININE 0.76 03/07/2021   CALCIUM 9.7 03/07/2021   GFRNONAA >60 03/07/2021   GFRAA >60 08/18/2020   HbA1c:  Lab Results  Component Value Date   HGBA1C 8.9 (H) 10/25/2020   LDL:  Lab Results  Component Value Date   LDLCALC 88 12/15/2015   Urine Drug Screen:     Component Value Date/Time    LABOPIA NONE DETECTED 06/01/2017 0250   COCAINSCRNUR NONE DETECTED 06/01/2017 0250   LABBENZ NONE DETECTED 06/01/2017 0250   AMPHETMU NONE DETECTED 06/01/2017 0250   THCU NONE DETECTED 06/01/2017 0250   LABBARB NONE DETECTED 06/01/2017 0250    Alcohol Level     Component Value Date/Time   ETH 102 (H) 05/31/2017 1836   No results found for: PHENYTOIN, ZONISAMIDE, LAMOTRIGINE, LEVETIRACETA No results found for: PHENYTOIN, PHENOBARB, VALPROATE, CBMZ  Imaging and Diagnostic studies   MRI Brain with and without contrast:  Atrophy and extensive chronic microvascular ischemia. No acute infarct negative for metastatic disease to the brain  Treated large nasopharyngeal mass now shows central fluid intensity and peripheral enhancement filling the nasopharynx extending into the sphenoid sinus and clivus. Continued imaging follow-up recommended.   Impression   Kenneth Franklin is a 75 y.o. male presenting with intractable R sided facial pain involving V2 mostly but also V1 and V3 with hx of Nasopharyngeal Carcinoma treated with radiation and chemotherapy(completed Nov 2021). The presentation of his facial pain is most consistent with a Cephalalgia rather than GCA. However, do agree that the presentation is not typical of known Cephalalgias. No clear autonomic features noted today and none that I see documented except that he had some runny nose with these headaches in he past. He does endorse having similar pains about 40 years ago and they went away. Althou these could be due to the perineural spread of the Right V2 Nerve but difficult to say. Elevated ESR and CRP do point to a potential GCA, clinical picture does not seem consistent with it thou. Will get a Temporal Artery [place] to evaluate for Halo sign.  Recommendations  - Continue Depakote and Neurontin - Continue Prednisone 84m daily - Hold off on Temporal Artery biopsy, will instead get a Temporal Artery [place] evaluate for Halo sign. - I  ordered 1 time dose of IV Fosphenytoin 156mKg to help with his pain. - Agree with PRN opiods until we have pain under better control. ______________________________________________________________________   Thank you for the opportunity to take part in the care of this patient. If you have any further questions, please contact the neurology consultation attending.  Signed,  SaFalls Churchager Number [phone]

## 2021-03-08 NOTE — Progress Notes (Signed)
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  GOAL:   Patient will meet greater than or equal to 90% of their needs  MONITOR:   PO intake,Supplement acceptance,Labs,Weight trends,Skin,I & O's  REASON FOR ASSESSMENT:   Consult Assessment of nutrition requirement/status  ASSESSMENT:   Kenneth [place] is a 75 y.o. 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  Pt admitted with intractable headache.   Reviewed I/O's: -180 ml x 24 hours  UOP: 300 ml x 24 hours  Per oncology notes, MRI of head reveals no progression of disease. Pt awaiting neurosurgery consult.    Per RN, pt has been sleeping throughout the day due to inability to sleep last night from extreme pain. RD did not awaken pt during assessment.   Pt with poor oral intake. Noted meal completion 0-50%.  Reviewed wt hx; pt has experienced a 14.4% wt loss over the past 3 months, which is significant for time frame.   Medications reviewed and include miralax and prednisone.   Palliative care consult pending. If within pt's goals  of care, pt may benefit from enteral nutrition support.   Pt with poor oral intake and would benefit from nutrient dense supplement. One Ensure Enlive supplement provides 350 kcals, 20 grams protein, and 44-45 grams of carbohydrate vs one Glucerna shake supplement, which provides 220 kcals, 10 grams of protein, and 26 grams of carbohydrate. Given pt's hx of DM, RD will reassess adequacy of PO intake, CBGS, and adjust supplement regimen as appropriate at follow-up.   Lab Results  Component Value Date   HGBA1C 8.9 (H) 10/25/2020   PTA DM medications are 5 mg glipizide daily and 500 mg daily with breakfast.  Labs reviewed: CBGS: 173-280 (inpatient orders for glycemic control are 0-5 units insulin aspart daily at bedtime and 0-9 units insulin aspart TID with meals).   NUTRITION - FOCUSED PHYSICAL EXAM:  Flowsheet Row Most Recent Value  Orbital Region Severe depletion  Upper Arm Region Severe depletion  Thoracic and Lumbar Region Unable to assess  Buccal Region Moderate depletion  Temple Region Severe depletion  Clavicle Bone Region Severe depletion  Clavicle and Acromion Bone Region Severe depletion  Scapular Bone Region Severe depletion  Dorsal Hand Unable to assess  Patellar Region Unable to assess  Anterior Thigh Region Unable to assess  Posterior Calf Region Unable to assess  Edema (RD Assessment) None  Hair Reviewed  Eyes Reviewed  Mouth Reviewed  Skin Reviewed  Nails Reviewed       Diet Order:   Diet Order            Diet heart healthy/carb modified Room service appropriate? Yes; Fluid consistency: Thin  Diet effective now  EDUCATION NEEDS:   No education needs have been identified at this time  Skin:  Skin Assessment: Reviewed RN Assessment  Last BM:  03/07/21  Height:   Ht Readings from Last 1 Encounters:  03/06/21 6' (1.829 m)    Weight:   Wt Readings from Last 1 Encounters:  03/06/21 61.2 kg    Ideal Body Weight:  80.9 kg  BMI:   Body mass index is 18.31 kg/m.  Estimated Nutritional Needs:   Kcal:  1950-2150  Protein:  110-125 grams  Fluid:  > 1.9 L    Loistine Chance, RD, LDN, [place] Registered Dietitian II Certified Diabetes Care and Education Specialist Please refer to [name] for RD and/or RD on-call/weekend/after hours pager

## 2021-03-08 NOTE — Plan of Care (Signed)
Pt has received 2 doses of IV dilaudid this shift. It has helped manage his pain. He reports resting better tonight. Problem: Education: Goal: Knowledge of General Education information will improve Description: Including pain rating scale, medication(s)/side effects and non-pharmacologic comfort measures Outcome: Progressing   Problem: Clinical Measurements: Goal: Ability to maintain clinical measurements within normal limits will improve Outcome: Progressing   Problem: Nutrition: Goal: Adequate nutrition will be maintained Outcome: Progressing   Problem: Pain Managment: Goal: General experience of comfort will improve Outcome: Progressing   Problem: Safety: Goal: Ability to remain free from injury will improve Outcome: Progressing

## 2021-03-08 NOTE — Progress Notes (Signed)
PROGRESS NOTE    Kenn[place]   XNA:[id number]  DOB: 1946/05/22  PCP: Verita Lamb, NP    DOA: 03/06/2021 LOS: 1   Brief Narrative   Kenneth [place] is a 75 y.o. 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  He had previously sought treatment in ED at [facility] and here at [facility] during this time, daughter reported 83 ED visits in the prior 10 days.  Patient has nasopharyngeal cancer, diagnosed in September 2021 after presenting with a 9-month persistent headache.  MRI showed a 5.9 cm nasopharyngeal mass involving the clivus and sphenoid sinus.  He underwent chemo and radiation therapy, finished on 10/21/2020.  Reportedly had a difficult time with treatment in declined further adjuvant therapy.  He had PET scan 02/22/2021 showing interval decrease in the size and FDG uptake of the mass.  Seen by neurology at [facility] on 03/02/2021.  Gabapentin was increased and he was started on Depakote with plans to exclude giant cell arteritis.  It was felt headaches could be related to be to perineural spread of his malignancy.  The presentation does not meet criteria migraine tension or cluster headache.  MRI here on 03/06/2021 revealed atrophy and extensive chronic microvascular ischemia, no evidence of acute infarct, no evidence of metastatic disease.  The nasopharyngeal mass showed central fluid intensity and peripheral enhancement filling the nasopharynx extending into the sphenoid sinus and clivus.  There was no clear evidence of progressive disease however.  Admitted to hospitalist service with Neurology consulted.  Oncology is following as well.    4/10 Patient continues to have intractable pain.  Given Klonipin ODT and IV Phenergan this AM and was able to rest.  Given the distribution of pain and above history with structural cause for trigeminal neuralgia syndrome,  this seems most likely.  Giant cell arteritis/temporal arteritis remains in the differential however.    Patient received IV lidocaine infusion on 4/10 for both diagnostic and therapeutic purposes.  If good response, trigeminal neuralgia is most likely, and temporal artery biopsy may not be required.  We would hope to avoid biopsy if possible, as TN pain would likely be further exacerbated.     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  See above summary for history.   TA/GCA remains in differential, but TN seems more likely.  ESR/CRP are up but nonspecific. 4/10 pt writhing and moaning in pain when seen early AM, given Klonopin ODT and IV Phenergan, was subsequently resting comfortably.   4/11 some improvement, still pain, less intense, still requires IV medication at times PLAN: --Neurology and Oncology following --received IV lidocaine infusion on 4/10 --Continue scheduled Klonopin TID for now, reduce if too sedating --Consider IV Phenergan to help rest if needed --IV Dilaudid for severe pain --Percocet 5-$RemoveBeforeDEI'10mg'dKzUAxQIiCNQIZUN$ /325 q4h PRN --Continue steroids, may affect TA biopsy results  --Continue gabapentin --Continue Depakote (consider change to Tegretol) --If lidocaine  infusion helpful, will refer to Dr. Holley Raring in pain management for outpatient infusions --Stop Imitrex, no indication  Nasopharyngeal carcinoma -follows Dr. Mike Gip.  Completed chemoradiation.  See her consult note of today for detailed  history.   Suspect this to be causing trigeminal neuralgia syndrome with intractable headache / facial pain. --Dr. Mike Gip is following  Reduced appetite due to loss of taste -due to radiation therapy. Weight loss -due to above.  Patient reported a recent neurology visit that he has lost 30 pounds or more since radiation. --Dietitian consult --Consider appetite stimulant however with lack of taste this may not be helpful  Hypertension -continue home losartan.  IV hydralazine as needed.  BP elevation may be related to pain.  Monitor  COPD -stable, without acute exacerbation.  Continue as needed albuterol and Mucinex.  Type 2 diabetes -poorly controlled, recent A1c 8.9%.  Takes Metformin and glipizide which are held. --Sliding scale NovoLog  History of stroke -continue aspirin Plavix and Lipitor  Depression with anxiety -continue home meds  GERD -continue Pepcid  Hyperlipidemia -continue Lipitor   History of alcohol use -in remission   Patient BMI: Body mass index is 18.31 kg/m.   DVT prophylaxis: heparin injection 5,000 Units Start: 03/06/21 1400   Diet:  Diet Orders (From admission, onward)    Start     Ordered   03/06/21 1232  Diet heart healthy/carb modified Room service appropriate? Yes; Fluid consistency: Thin  Diet effective now       Question Answer Comment  Diet-HS Snack? Nothing   Room service appropriate? Yes   Fluid consistency: Thin      03/06/21 1231            Code Status: Full Code    Subjective 03/08/21    Patient was just returning to bed with nurse tech assistance.  Reports HA better today, pain currently 6 out of 10.  Appears much more comfortable.  NT reports he had a lot more pain earlier this AM but medication given helped him to rest.   Disposition Plan & Communication   Status is: Inpatient  Inpatient status appropriate due to intractable pain, requiring IV medications, pain uncontrolled, ongoing evaluation.    Dispo: The patient is  from: home              Anticipated d/c is to: home              Patient currently not medically stable for d/c.   Difficult to place patient no    Family Communication: Spoke to patient's stepdaughter, Ernestina Patches, by phone this afternoon.     Consults, Procedures, Significant Events   Consultants:   Neurology  Oncology  Procedures:   IV Lidocaine Infusion 4/10  Antimicrobials:  Anti-infectives (From admission, onward)   None        Micro    Objective   Vitals:   03/08/21 0138 03/08/21 0507 03/08/21 0900 03/08/21 1216  BP: 135/69 (!) 148/69 (!) 97/56 (!) 151/77  Pulse: 61 (!) 55 (!) 57 63  Resp: _0 Temp: 98 F (36.7 C) 98.2 F (36.8 C) 98 F (36.7 C) 97.7 F (36.5 C)  TempSrc: Oral   Oral  SpO2: 94% 96% 94% 97%  Weight:      Height:        Intake/Output Summary (Last 24 hours) at 03/08/2021 1307 Last data filed at 03/08/2021 1100 Gross per 24 hour  Intake 120 ml  Output 150 ml  Net -  30 ml   Filed Weights   03/06/21 0321  Weight: 61.2 kg    Physical Exam:  General exam: just returning to bed from bathroom, no acute distress but winces at times HEENT: keeps eyes closed, moist mucus membranes, hearing grossly normal  Respiratory system: CTAB, no wheezes or rhonchi, normal respiratory effort, on room air. Cardiovascular system: normal S1/S2, RRR, no pedal edema.   GI: soft, nontender abdomen Central nervous system: oriented x3, no gross focal neurologic deficits, normal speech Extremities: moves all, no edema, normal tone  Labs   Data Reviewed: I have personally reviewed following labs and imaging studies  CBC: Recent Labs  Lab 03/03/21 1154 03/06/21 1036 03/07/21 0559  WBC 8.0 11.8* 8.6  HGB 13.1 11.2* 11.6*  HCT 40.5 34.7* 35.4*  MCV 91.8 91.3 90.1  PLT 332 326 185   Basic Metabolic Panel: Recent Labs  Lab 03/03/21 1154 03/06/21 1036 03/07/21 0559  NA 139 133* 135  K 4.4 4.9 3.9  CL 102 96* 97*  CO2 _0 GLUCOSE 184* 251* 145*  BUN 15 26* 24*  CREATININE 0.78 0.91 0.76  CALCIUM 9.9 9.8 9.7  MG  --   --  2.0   GFR: Estimated Creatinine Clearance: 72.3 mL/min (by C-G formula based on SCr of 0.76 mg/dL). Liver Function Tests: No results for input(s): AST, ALT, ALKPHOS, BILITOT, PROT, ALBUMIN in the last 168 hours. No results for input(s): LIPASE, AMYLASE in the last 168 hours. No results for input(s): AMMONIA in the last 168 hours. Coagulation Profile: Recent Labs  Lab 03/06/21 1606  INR 1.0   Cardiac Enzymes: No results for input(s): CKTOTAL, CKMB, CKMBINDEX, TROPONINI in the last 168 hours. BNP (last 3 results) No results for input(s): PROBNP in the last 8760 hours. HbA1C: No results for input(s): HGBA1C in the last 72 hours. CBG: Recent Labs  Lab 03/07/21 1130 03/07/21 1554 03/07/21 2106 03/08/21 0929 03/08/21 1214  GLUCAP 251* 237* 232* 173* 203*   Lipid Profile: No results for input(s): CHOL, HDL, LDLCALC, TRIG, CHOLHDL, LDLDIRECT in the last 72 hours. Thyroid Function Tests: No results for input(s): TSH, T4TOTAL, FREET4, T3FREE, THYROIDAB in the last 72 hours. Anemia Panel: No results for input(s): VITAMINB12, FOLATE, FERRITIN, TIBC, IRON, RETICCTPCT in the last 72 hours. Sepsis Labs: No results for input(s): PROCALCITON, LATICACIDVEN in the last 168 hours.  Recent Results (from the past 240 hour(s))  SARS CORONAVIRUS 2 (TAT 6-24 HRS) Nasopharyngeal Nasopharyngeal Swab     Status: None   Collection Time: 03/06/21  9:35 AM   Specimen: Nasopharyngeal Swab  Result Value Ref Range Status   SARS Coronavirus 2 NEGATIVE NEGATIVE Final    Comment: (NOTE) SARS-CoV-2 target nucleic acids are NOT DETECTED.  The SARS-CoV-2 RNA is generally detectable in upper and lower respiratory specimens during the acute phase of infection. Negative results do not preclude SARS-CoV-2 infection, do not rule out co-infections with other pathogens, and should not be used as the sole  basis for treatment or other patient management decisions. Negative results must be combined with clinical observations, patient history, and epidemiological information. The expected result is Negative.  Fact Sheet for Patients: SugarRoll.be  Fact Sheet for Healthcare Providers: https://www.woods-mathews.com/  This test is not yet approved or cleared by the [place] FDA and  has been authorized for detection and/or diagnosis of SARS-CoV-2 by FDA under an Emergency Use Authorization (EUA). This EUA will remain  in effect (meaning this test can be used) for the duration  of the COVID-19 declaration under Se ction 564(b)(1) of the Act, 21 U.S.C. section 360bbb-3(b)(1), unless the authorization is terminated or revoked sooner.  Performed at [facility], [address], [place], [place] [address]       Imaging Studies   MR BRAIN W WO CONTRAST  Result Date: 03/06/2021 CLINICAL DATA:  Headache. History of nasopharyngeal carcinoma post treatment. EXAM: MRI HEAD WITHOUT AND WITH CONTRAST TECHNIQUE: Multiplanar, multiecho pulse sequences of the brain and surrounding structures were obtained without and with intravenous contrast. CONTRAST:  59mL GADAVIST GADOBUTROL 1 MMOL/ML IV SOLN COMPARISON:  CT head 02/24/2021.  MRI head 08/13/2020 FINDINGS: Brain: Moderate generalized atrophy. Ventricular enlargement chronic and unchanged from prior studies. Extensive white matter hyperintensity bilaterally. Mild patchy hyperintensity in the pons. Negative for acute infarct, hemorrhage, mass. Negative for metastatic disease to the brain. Vascular: Normal arterial flow voids. Skull and upper cervical spine: Abnormal skull base described below. Otherwise negative calvarium. Sinuses/Orbits: Large nasopharyngeal mass on the prior study now shows central necrosis and peripheral irregular enhancement. This extends throughout the sphenoid sinus and into the clivus.  This is the initial post treatment study and continued follow-up is warranted. Bilateral mastoid effusion. Bilateral cataract extraction Other: None IMPRESSION: Atrophy and extensive chronic microvascular ischemia. No acute infarct negative for metastatic disease to the brain Treated large nasopharyngeal mass now shows central fluid intensity and peripheral enhancement filling the nasopharynx extending into the sphenoid sinus and clivus. Continued imaging follow-up recommended. Electronically Signed   By: Marlan Palau M.D.   On: 03/06/2021 13:59     Medications   Scheduled Meds: . aspirin EC  81 mg Oral Daily  . atorvastatin  80 mg Oral QPM  . busPIRone  10 mg Oral QHS  . clonazepam  0.5 mg Oral TID  . clopidogrel  75 mg Oral Daily  . divalproex  250 mg Oral QHS  . [START ON 03/13/2021] divalproex  500 mg Oral QHS  . [START ON 03/20/2021] divalproex  750 mg Oral QHS  . escitalopram  20 mg Oral Daily  . famotidine  40 mg Oral QPM  . feeding supplement  237 mL Oral TID BM  . [START ON 03/13/2021] gabapentin  300 mg Oral Q0600  . gabapentin  600 mg Oral QHS  . heparin  5,000 Units Subcutaneous Q8H  . insulin aspart  0-5 Units Subcutaneous QHS  . insulin aspart  0-9 Units Subcutaneous TID WC  . losartan  100 mg Oral Daily  . polyethylene glycol  17 g Oral Daily  . predniSONE  50 mg Oral Q breakfast   Continuous Infusions: . promethazine (PHENERGAN) injection (IM or IVPB) 12.5 mg (03/08/21 0805)       LOS: 1 day    Time spent: 25 minutes with > 50% spent in coordination of care and at bedside.    Pennie Banter, DO [facility]  03/08/2021, 1:07 PM      If 7PM-7AM, please contact night-coverage. How to contact the [facility] Attending or Consulting provider 7A - 7P or covering provider during after hours 7P -7A, for this patient?    1. Check the care team in [facility] and look for a) attending/consulting [facility] provider listed and b) the [facility] team listed 2. Log into [email or web address]  and use [facility] universal password to access. If you do not have the password, please contact the hospital operator. 3. Locate the [facility] provider you are looking for under [facility] and page to a number that  you can be directly reached. 4. If you still have difficulty reaching the provider, please page the [facility] (Director on Call) for the Hospitalists listed on amion for assistance.

## 2021-03-08 NOTE — Progress Notes (Signed)
OT Cancellation Note  Patient Details Name: Kenneth [place] MRN: [id number] DOB: 07/24/1946   Cancelled Treatment:    Reason Eval/Treat Not Completed: Patient unavailable. Pt sleeping soundly this AM. Nurse requests allowing pt to continuing sleeping, as he had been unable to fall asleep until this morning. We return at a later time/date, as pt is awake and able to participate in OT session.  Josiah Lobo, PhD, MS, OTR/L 03/08/21, 1:14 PM

## 2021-03-08 NOTE — Progress Notes (Signed)
PT Cancellation Note  Patient Details Name: [place] MRN: [id number] DOB: 1946/06/02   Cancelled Treatment:    Reason Eval/Treat Not Completed: Other (comment) PT orders received, chart reviewed. Spoke with nurse who reports pt is finally sleeping after dealing with significant, prolonged HA. She reports she was unable to wake pt for blood sugar check this AM. Pt observed resting soundly in bed. Will f/u for PT evaluation as able & as pt is available & able to participate.   Lavone Nian, PT, DPT 03/08/21, 10:35 AM    [name] 03/08/2021, 10:35 AM

## 2021-03-09 ENCOUNTER — Inpatient Hospital Stay: Payer: Medicare Other

## 2021-03-09 DIAGNOSIS — Z515 Encounter for palliative care: Secondary | ICD-10-CM

## 2021-03-09 DIAGNOSIS — R519 Headache, unspecified: Secondary | ICD-10-CM | POA: Diagnosis not present

## 2021-03-09 DIAGNOSIS — E43 Unspecified severe protein-calorie malnutrition: Secondary | ICD-10-CM | POA: Insufficient documentation

## 2021-03-09 LAB — GLUCOSE, CAPILLARY
Glucose-Capillary: 127 mg/dL — ABNORMAL HIGH (ref 70–99)
Glucose-Capillary: 249 mg/dL — ABNORMAL HIGH (ref 70–99)
Glucose-Capillary: 244 mg/dL — ABNORMAL HIGH (ref 70–99)
Glucose-Capillary: 223 mg/dL — ABNORMAL HIGH (ref 70–99)

## 2021-03-09 MED ORDER — CLONAZEPAM 0.25 MG PO TBDP
0.2500 mg | ORAL_TABLET | Freq: Three times a day (TID) | ORAL | Status: DC
  Administered 2021-03-09 – 2021-03-13 (×11): 0.25 mg via ORAL
  Filled 2021-03-09 (×13): qty 1

## 2021-03-09 MED ORDER — CARBAMAZEPINE 100 MG PO CHEW
100.0000 mg | CHEWABLE_TABLET | Freq: Three times a day (TID) | ORAL | Status: DC
  Administered 2021-03-09 – 2021-03-10 (×4): 100 mg via ORAL
  Filled 2021-03-09 (×6): qty 1

## 2021-03-09 NOTE — Evaluation (Signed)
Physical Therapy Evaluation Patient Details Name: Kenneth Franklin MRN: [id number] DOB: [birth year] Today's Date: 03/09/2021   History of Present Illness  Pt is a 75 y.o. 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  MD assessment includes: intractable headache, brain atrophy and extensive chronic microvascular ischemia, no acute infarct and negative for metastatic disease to the brain.    Clinical Impression  Pt was mildly lethargic but pleasant and put forth good effort during the session.  Pt presented with significant R lateral lean while in sitting at the EOB and this could not be corrected with cuing or L lateral leaning.  Pt reported feeling like "the bed is at an angle".  Pt presented with posterior instability in standing and required constant Mod A to prevent LOB. Pt's SpO2 and HR were both WNL during the session with pt reporting no adverse symptoms other than HA. MD notified of pt's balance impairments.  Pt will benefit from PT services in a SNF setting upon discharge to safely address deficits listed in patient problem list for decreased caregiver assistance and eventual return to PLOF.      Follow Up Recommendations SNF    Equipment Recommendations  Other (comment) (TBD at next venue of care)    Recommendations for Other Services       Precautions / Restrictions Precautions Precautions: Fall Restrictions Weight Bearing Restrictions: No      Mobility  Bed Mobility Overal bed mobility: Needs Assistance Bed Mobility: Supine to Sit;Sit to Supine     Supine to sit: Supervision Sit to supine: Supervision   General bed mobility comments: requires increased time and effort    Transfers Overall transfer level: Needs assistance Equipment used: Rolling walker (2 wheeled) Transfers: Sit  to/from Stand Sit to Stand: Mod assist         General transfer comment: Mod A to prevent posterior LOB while in standing  Ambulation/Gait             General Gait Details: unable secondary to posterior instability  Stairs            Wheelchair Mobility    Modified Rankin (Stroke Patients Only)       Balance Overall balance assessment: Needs assistance Sitting-balance support: No upper extremity supported;Feet supported Sitting balance-Leahy Scale: Poor Sitting balance - Comments: R lateral lean in sitting with pt unable to remain upright with even min perturbation to the R   Standing balance support: Bilateral upper extremity supported;During functional activity Standing balance-Leahy Scale: Poor Standing balance comment: constant Mod A to prevent posterior LOB                             Pertinent Vitals/Pain Pain Assessment: 0-10 Pain Score: 8  Pain Location: R sided HA Pain Descriptors / Indicators: Headache Pain Intervention(s): Premedicated before session;Monitored during session    Home Living Family/patient expects to be discharged to:: Private residence Living Arrangements: Alone Available Help at Discharge: Available PRN/intermittently Type of Home: House Home Access: Ramped entrance     Home Layout: One level Home Equipment: Walker - standard;Cane - single point;Walker - 2 wheels      Prior Function Level of Independence: Needs assistance   Gait / Transfers Assistance Needed: Ind amb limited community distances without an AD, occasional SPC PRN when feeling weak,  no fall history, Ind with ADLs  ADL's / Homemaking Assistance Needed: Ind with dressing, bathing, housework, except reports stepdaughter brings him food daily. Pt has given up driving following chemo and radiation.        Hand Dominance        Extremity/Trunk Assessment   Upper Extremity Assessment Upper Extremity Assessment: Generalized weakness (Generalized  weakness but equal L/R with sensation to light touch WNL)    Lower Extremity Assessment Lower Extremity Assessment: Generalized weakness (Generalized weakness but equal L/R with sensation to light touch WNL)       Communication   Communication: HOH  Cognition Arousal/Alertness: Lethargic Behavior During Therapy: WFL for tasks assessed/performed Overall Cognitive Status: Within Functional Limits for tasks assessed                                 General Comments: pt oriented to place, situation. Identifies year as 2022 but month as March. States that, since his cancer treatment, he often feels "confused."      General Comments      Exercises Total Joint Exercises Ankle Circles/Pumps: AROM;Both;10 reps Quad Sets: Strengthening;Both;10 reps Gluteal Sets: 10 reps;Both;Strengthening Hip ABduction/ADduction: Strengthening;Both;10 reps Straight Leg Raises: 10 reps;Both;Strengthening Long Arc Quad: AROM;Strengthening;Both;10 reps Knee Flexion: AROM;Strengthening;Both;10 reps Other Exercises Other Exercises: Left lateral weight shifting in sitting to address R lateral lean with no carryover Other Exercises: Anterior weight shifting activities in standing to address posterior instability with no carryover   Assessment/Plan    PT Assessment Patient needs continued PT services  PT Problem List Decreased strength;Decreased activity tolerance;Decreased balance;Decreased mobility;Decreased knowledge of use of DME       PT Treatment Interventions DME instruction;Gait training;Functional mobility training;Therapeutic activities;Therapeutic exercise;Balance training;Patient/family education;Neuromuscular re-education    PT Goals (Current goals can be found in the Care Plan section)  Acute Rehab PT Goals Patient Stated Goal: To be stronger and to be able to do more PT Goal Formulation: With patient Time For Goal Achievement: 03/22/21 Potential to Achieve Goals: Good     Frequency Min 2X/week   Barriers to discharge Inaccessible home environment;Decreased caregiver support      Co-evaluation               AM-PAC PT "6 Clicks" Mobility  Outcome Measure Help needed turning from your back to your side while in a flat bed without using bedrails?: A Little Help needed moving from lying on your back to sitting on the side of a flat bed without using bedrails?: A Little Help needed moving to and from a bed to a chair (including a wheelchair)?: A Lot Help needed standing up from a chair using your arms (e.g., wheelchair or bedside chair)?: A Lot Help needed to walk in hospital room?: Total Help needed climbing 3-5 steps with a railing? : Total 6 Click Score: 12    End of Session Equipment Utilized During Treatment: Gait belt Activity Tolerance: Patient tolerated treatment well Patient left: in bed;with call bell/phone within reach;with bed alarm set Nurse Communication: Mobility status;Other (comment) (MD notified of pt's R lateral lean in sitting and posterior instability in standing) PT Visit Diagnosis: Unsteadiness on feet (R26.81);Difficulty in walking, not elsewhere classified (R26.2);Muscle weakness (generalized) (M62.81)    Time: 2633-3545 PT Time Calculation (min) (ACUTE ONLY): 25 min   Charges:   PT Evaluation $PT Eval Moderate Complexity: 1 Mod PT Treatments $Therapeutic Exercise: 8-22 mins  Linus Salmons PT, DPT 03/09/21, 1:51 PM

## 2021-03-09 NOTE — Progress Notes (Signed)
Advance Directive Education completed. Chaplain suggested patient share information with family members. Should he or his family have questions contact the chaplain on call.

## 2021-03-09 NOTE — Progress Notes (Signed)
PROGRESS NOTE    Kenneth Franklin   KWI:[id number]  DOB: 02-Jan-1946  PCP: Verita Lamb, NP    DOA: 03/06/2021 LOS: 2   Brief Narrative   Kenneth Franklin is a 75 y.o. 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  He had previously sought treatment in ED at [facility] and here at [facility] during this time, daughter reported 70 ED visits in the prior 10 days.  Patient has nasopharyngeal cancer, diagnosed in September 2021 after presenting with a 42-monthpersistent headache.  MRI showed a 5.9 cm nasopharyngeal mass involving the clivus and sphenoid sinus.  He underwent chemo and radiation therapy, finished on 10/21/2020.  Reportedly had a difficult time with treatment in declined further adjuvant therapy.  He had PET scan 02/22/2021 showing interval decrease in the size and FDG uptake of the mass.  [facility]  Gabapentin was increased and he was started on Depakote with plans to exclude giant cell arteritis.  It was felt headaches could be related to be to perineural spread of his malignancy.  The presentation does not meet criteria migraine tension or cluster headache.  MRI here on 03/06/2021 revealed atrophy and extensive chronic microvascular ischemia, no evidence of acute infarct, no evidence of metastatic disease.  The nasopharyngeal mass showed central fluid intensity and peripheral enhancement filling the nasopharynx extending into the sphenoid sinus and clivus.  There was no clear evidence of progressive disease however.  Admitted to hospitalist service with Neurology consulted.  Oncology is following as well.    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  Giant cell arteritis/temporal arteritis remains in the differential however.  Ultrasound of  temporal artery to look for halo sign is planned per neurology.  Patient received IV lidocaine infusion on 4/10 for both diagnostic and therapeutic purposes.  If good response, trigeminal neuralgia is most likely, and temporal artery biopsy may not be required.  We would hope to avoid biopsy if possible, as TN pain would likely be further exacerbated.     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  See above summary for history.   TA/GCA remains in differential, but TN seems more likely.  ESR/CRP are up but nonspecific. 4/10 pt writhing and moaning in pain when seen early AM, given Klonopin ODT and IV Phenergan, was subsequently resting comfortably.   4/11 some improvement, still pain, less intense, still requires IV medication at times 4/12: pain some improved, still needs IV pain med, appears balance/equilibrium off ?effect of fosphenytoin. This is new today. PLAN: --Neurology and Oncology following --received IV lidocaine infusion on 4/10 --received IV fosphenytoin on 4/11 --Continue scheduled ODT Klonopin TID for now, reduced to 0.25 mg --Consider IV Phenergan to help rest if needed --IV Dilaudid for severe/breakthrough pain --Percocet [id number]  q4h PRN --Continue steroids, may affect TA biopsy results  --Continue gabapentin --Continue Depakote (consider change to Tegretol) --If lidocaine infusion helpful, will refer to Dr. Holley Raring in pain management  for outpatient infusions --Stop Imitrex, no indication  Nasopharyngeal carcinoma -follows Dr. Mike Gip.  Completed chemoradiation.  See her consult note of today for detailed history.   Suspect this to be causing trigeminal neuralgia syndrome with intractable headache / facial pain. --Dr. Mike Gip is following --Palliative care consulted for both GOC and sx mgmt  Generalized weakness - due to likely malnutrition given wt loss and pt lack of taste has caused poor PO intake since radiation. --PT and OT recommending SNF   Reduced appetite due to loss of taste -due to radiation therapy. Weight loss -due to above.  Patient reported a recent neurology visit that he has lost 30 pounds or more since radiation. --Dietitian consult --Consider appetite stimulant however with lack of taste this may not be helpful  Hypertension -continue home losartan.  IV hydralazine as needed.  BP elevation may be related to pain.  Monitor  COPD -stable, without acute exacerbation.  Continue as needed albuterol and Mucinex.  Type 2 diabetes -poorly controlled, recent A1c 8.9%.  Takes Metformin and glipizide which are held. --Sliding scale NovoLog  History of stroke -continue aspirin Plavix and Lipitor  Depression with anxiety -continue home meds  GERD -continue Pepcid  Hyperlipidemia -continue Lipitor   History of alcohol use -in remission   Patient BMI: Body mass index is 18.31 kg/m.   DVT prophylaxis: heparin injection 5,000 Units Start: 03/06/21 1400   Diet:  Diet Orders (From admission, onward)    Start     Ordered   03/08/21 1644  Diet regular Room service appropriate? Yes with Assist; Fluid consistency: Thin  Diet effective now       Question Answer Comment  Room service appropriate? Yes with Assist   Fluid consistency: Thin      03/08/21 1643            Code Status: Full Code    Subjective 03/09/21    Patient sleeping comfortably this AM when seen.  He reports currently pain is  okay.  Continues to have spikes of sharp shooting pain at times.    Disposition Plan & Communication   Status is: Inpatient  Inpatient status appropriate due to intractable pain, requiring IV medications, pain uncontrolled, ongoing evaluation.    Dispo: The patient is from: home              Anticipated d/c is to: home              Patient currently not medically stable for d/c.   Difficult to place patient no    Family Communication: Spoke to patient's stepdaughter, Ernestina Patches, by phone this afternoon.     Consults, Procedures, Significant Events   Consultants:   Neurology  Oncology  Procedures:   IV Lidocaine Infusion 4/10  Antimicrobials:  Anti-infectives (From admission, onward)   None        Micro    Objective   Vitals:   03/09/21 0535 03/09/21 0628 03/09/21 0735 03/09/21 1152  BP:   (!) 165/79 136/66  Pulse:   63 63  Resp: _0 Temp:   97.9 F (36.6 C) 98.3 F (36.8 C)  TempSrc:      SpO2:   100% 95%  Weight:      Height:        Intake/Output Summary (  Last 24 hours) at 03/09/2021 1540 Last data filed at 03/09/2021 1046 Gross per 24 hour  Intake 50 ml  Output 925 ml  Net -875 ml   Filed Weights   03/06/21 0321  Weight: 61.2 kg    Physical Exam:  General exam: sleeping comfortably, woke to voice, no acute distress HEENT: hearing grossly normal, eyes kept closed Respiratory system: symmetric chest rise, normal respiratory effort, on room air. Cardiovascular system: RRR, no pedal edema.   Central nervous system: oriented x3, no gross focal neurologic deficits, normal speech  Labs   Data Reviewed: I have personally reviewed following labs and imaging studies  CBC: Recent Labs  Lab 03/03/21 1154 03/06/21 1036 03/07/21 0559  WBC 8.0 11.8* 8.6  HGB 13.1 11.2* 11.6*  HCT 40.5 34.7* 35.4*  MCV 91.8 91.3 90.1  PLT 332 326 974   Basic Metabolic Panel: Recent Labs  Lab 03/03/21 1154 03/06/21 1036 03/07/21 0559  NA 139  133* 135  K 4.4 4.9 3.9  CL 102 96* 97*  CO2 _0 GLUCOSE 184* 251* 145*  BUN 15 26* 24*  CREATININE 0.78 0.91 0.76  CALCIUM 9.9 9.8 9.7  MG  --   --  2.0   GFR: Estimated Creatinine Clearance: 72.3 mL/min (by C-G formula based on SCr of 0.76 mg/dL). Liver Function Tests: No results for input(s): AST, ALT, ALKPHOS, BILITOT, PROT, ALBUMIN in the last 168 hours. No results for input(s): LIPASE, AMYLASE in the last 168 hours. No results for input(s): AMMONIA in the last 168 hours. Coagulation Profile: Recent Labs  Lab 03/06/21 1606  INR 1.0   Cardiac Enzymes: No results for input(s): CKTOTAL, CKMB, CKMBINDEX, TROPONINI in the last 168 hours. BNP (last 3 results) No results for input(s): PROBNP in the last 8760 hours. HbA1C: No results for input(s): HGBA1C in the last 72 hours. CBG: Recent Labs  Lab 03/08/21 1214 03/08/21 1533 03/08/21 2152 03/09/21 0736 03/09/21 1152  GLUCAP 203* 280* 204* 127* 249*   Lipid Profile: No results for input(s): CHOL, HDL, LDLCALC, TRIG, CHOLHDL, LDLDIRECT in the last 72 hours. Thyroid Function Tests: No results for input(s): TSH, T4TOTAL, FREET4, T3FREE, THYROIDAB in the last 72 hours. Anemia Panel: No results for input(s): VITAMINB12, FOLATE, FERRITIN, TIBC, IRON, RETICCTPCT in the last 72 hours. Sepsis Labs: No results for input(s): PROCALCITON, LATICACIDVEN in the last 168 hours.  Recent Results (from the past 240 hour(s))  SARS CORONAVIRUS 2 (TAT 6-24 HRS) Nasopharyngeal Nasopharyngeal Swab     Status: None   Collection Time: 03/06/21  9:35 AM   Specimen: Nasopharyngeal Swab  Result Value Ref Range Status   SARS Coronavirus 2 NEGATIVE NEGATIVE Final    Comment: (NOTE) SARS-CoV-2 target nucleic acids are NOT DETECTED.  The SARS-CoV-2 RNA is generally detectable in upper and lower respiratory specimens during the acute phase of infection. Negative results do not preclude SARS-CoV-2 infection, do not rule out co-infections  with other pathogens, and should not be used as the sole basis for treatment or other patient management decisions. Negative results must be combined with clinical observations, patient history, and epidemiological information. The expected result is Negative.  Fact Sheet for Patients: SugarRoll.be  Fact Sheet for Healthcare Providers: https://www.woods-mathews.com/  This test is not yet approved or cleared by the [place] FDA and  has been authorized for detection and/or diagnosis of SARS-CoV-2 by FDA under an Emergency Use Authorization (EUA). This EUA will remain  in effect (meaning this test can be used) for  the duration of the COVID-19 declaration under Se ction 564(b)(1) of the Act, 21 U.S.C. section 360bbb-3(b)(1), unless the authorization is terminated or revoked sooner.  Performed at [facility], [place] [address], [place], [place] [id number]       Imaging Studies   US Carotid Duplex Right  Result Date: 03/09/2021 CLINICAL DATA:  Evaluate for giant cell arteritis. Area of concern is on the right side. EXAM: EXTRACRANIAL ARTERIAL DUPLEX LIMITED - TEMPORAL ARTERIES TECHNIQUE: Pearline Cables scale imaging, color Doppler and duplex ultrasound was performed of the bilateral temporal arteries. COMPARISON:  MRI head 03/06/2021 FINDINGS: Right temporal artery is patent with a normal high resistance waveform. There is no evidence for right temporal artery wall thickening and no evidence for a halo sign. Images of the left temporal artery were obtained for comparison. Normal appearance of the left temporal artery. Evidence for wall atherosclerotic calcifications within the right temporal artery. IMPRESSION: Normal appearance of the bilateral temporal arteries with exception of mild atherosclerotic disease. No evidence for giant cell arteritis by sonography. Electronically Signed   By: Markus Daft M.D.   On: 03/09/2021 11:21     Medications    Scheduled Meds: . aspirin EC  81 mg Oral Daily  . atorvastatin  80 mg Oral QPM  . busPIRone  10 mg Oral QHS  . clonazepam  0.25 mg Oral TID  . clopidogrel  75 mg Oral Daily  . divalproex  250 mg Oral QHS  . [START ON 03/13/2021] divalproex  500 mg Oral QHS  . [START ON 03/20/2021] divalproex  750 mg Oral QHS  . escitalopram  20 mg Oral Daily  . famotidine  40 mg Oral QPM  . feeding supplement  237 mL Oral TID BM  . [START ON 03/13/2021] gabapentin  300 mg Oral Q0600  . gabapentin  600 mg Oral QHS  . heparin  5,000 Units Subcutaneous Q8H  . insulin aspart  0-5 Units Subcutaneous QHS  . insulin aspart  0-9 Units Subcutaneous TID WC  . losartan  100 mg Oral Daily  . multivitamin with minerals  1 tablet Oral Daily  . polyethylene glycol  17 g Oral Daily  . predniSONE  50 mg Oral Q breakfast   Continuous Infusions: . promethazine (PHENERGAN) injection (IM or IVPB) Stopped (03/08/21 0820)       LOS: 2 days    Time spent: 25 minutes with > 50% spent in coordination of care and at bedside.    Ezekiel Slocumb, DO [facility]  03/09/2021, 3:40 PM      If 7PM-7AM, please contact night-coverage. How to contact the [facility] Attending or Consulting provider [place] or covering provider during after hours [place], for this patient?    1. Check the care team in [facility] and look for a) attending/consulting [facility] provider listed and b) the [facility] team listed 2. Log into [email or web address] and use [place]'s universal password to access. If you do not have the password, please contact the hospital operator. 3. Locate the [facility] provider you are looking for under [facility] and page to a number that you can be directly reached. 4. If you still have difficulty reaching the provider, please page the [facility] (Director on Call) for the Hospitalists listed on amion for assistance.

## 2021-03-09 NOTE — Consult Note (Signed)
[name]  Telephone:([phone] [phone]   Name: Kenneth Franklin Date: 03/09/2021 MRN: [id number]  DOB: 01-11-1946  Patient Care Team: Verita Lamb, NP as PCP - General Lequita Asal, MD as Referring Physician (Hematology and Oncology) Margaretha Sheffield, MD (Otolaryngology)    REASON FOR CONSULTATION: Kenneth Franklin is a 75 y.o. 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  Patient was previously seen in the ED multiple times for same complaint without improvement in symptoms.  PET scan on 02/14/2021 revealed interval decrease in size and FDG uptake of his nasopharyngeal mass.  Patient is actively followed by neurology for presumed trigeminal neuralgia.  He has received IV lidocaine and fosphenytoin by neurology.  Palliative care was consulted up address goals.  SOCIAL HISTORY:     reports that he quit smoking about 18 months ago. His smoking use included cigars and cigarettes. He has a 40.00 pack-year smoking history. He has never used smokeless tobacco. He reports previous alcohol use. He reports that he does not use drugs.  Patient is a widower.  He lives at home alone.  He has a stepdaughter who is involved in his care.  Patient previously worked in [employer].  ADVANCE DIRECTIVES:  Does not have  CODE STATUS: DNR  PAST MEDICAL HISTORY: Past Medical History:  Diagnosis Date  . Acute ischemic stroke ([place]) 2017  . Alcohol abuse   . Anxiety   . Cancer ([place])   . Depression   . Diabetes mellitus without complication ([place])   . Dyspnea    pcp knows and ordered rescue inhaler  . Hyperlipidemia   . Hypertension   . Skin cancer    Squamous Cell Carcinoma In Situ    PAST SURGICAL HISTORY:  Past Surgical History:  Procedure Laterality Date  . CATARACT EXTRACTION Bilateral    . COLONOSCOPY    . NASOPHARYNGOSCOPY N/A 08/12/2020   Procedure: ENDOSCOPIC NASOPHARYNGOSCOPY WITH BIOPSY;  Surgeon: Margaretha Sheffield, MD;  Location: [facility];  Service: ENT;  Laterality: N/A;  . PORTA CATH INSERTION N/A 08/24/2020   Procedure: PORTA CATH INSERTION;  Surgeon: Algernon Huxley, MD;  Location: [facility];  Service: Cardiovascular;  Laterality: N/A;    HEMATOLOGY/ONCOLOGY HISTORY:  Oncology History  Nasopharyngeal carcinoma ([place])  08/18/2020 Initial Diagnosis   Nasopharyngeal carcinoma ([place])   08/18/2020 Cancer Staging   Staging form: Pharynx - Nasopharynx, AJCC 8th Edition - Clinical stage from 08/18/2020: Stage III (cT3, cN0, cM0) - Signed by Lequita Asal, MD on 02/17/2021 Histopathologic type: Squamous cell carcinoma, keratinizing, NOS Stage prefix: Initial diagnosis ECOG performance status: Grade 1 Stage used in treatment planning: Yes National guidelines used in treatment planning: Yes Type of national guideline used in treatment planning: NCCN   08/24/2020 - 10/20/2020 Chemotherapy         12/31/2020 -  Chemotherapy    Patient is on Treatment Plan: HEAD/NECK NASOPHARYNGEAL ADJUVANT CISPLATIN D1 + 5FU IVCI D1-5 Q28D        ALLERGIES:  is allergic to propoxyphene.  MEDICATIONS:  Current Facility-Administered Medications  Medication Dose Route Frequency Provider Last Rate Last Admin  . acetaminophen (TYLENOL) tablet 650 mg  650 mg Oral Q6H PRN Ivor Costa, MD   650 mg at 03/07/21 0321  . albuterol (VENTOLIN HFA) 108 (90 Base) MCG/ACT inhaler 2 puff  2 puff Inhalation Q4H PRN  Ivor Costa, MD      . alum & mag hydroxide-simeth (MAALOX/MYLANTA) 200-200-20 MG/5ML suspension 30 mL  30 mL Oral Q6H PRN Ivor Costa, MD   30 mL at 03/06/21 1917  . aspirin EC tablet 81 mg  81 mg Oral Daily Ivor Costa, MD   81 mg at 03/09/21 0809  . atorvastatin (LIPITOR) tablet 80 mg  80 mg Oral QPM Ivor Costa, MD   80 mg at 03/08/21 1733  . benzocaine (ORAJEL) 10 % mucosal gel    Mouth/Throat QID PRN Nicole Kindred A, DO      . busPIRone (BUSPAR) tablet 10 mg  10 mg Oral QHS Ivor Costa, MD   10 mg at 03/08/21 2018  . clonazePAM (KLONOPIN) disintegrating tablet 0.25 mg  0.25 mg Oral TID Nicole Kindred A, DO      . clopidogrel (PLAVIX) tablet 75 mg  75 mg Oral Daily Ivor Costa, MD   75 mg at 03/09/21 0811  . dextromethorphan-guaiFENesin (MUCINEX DM) 30-600 MG per 12 hr tablet 1 tablet  1 tablet Oral BID PRN Ivor Costa, MD      . divalproex (DEPAKOTE ER) 24 hr tablet 250 mg  250 mg Oral QHS Ivor Costa, MD   250 mg at 03/08/21 2018  . [START ON 03/13/2021] divalproex (DEPAKOTE ER) 24 hr tablet 500 mg  500 mg Oral QHS Ivor Costa, MD      . Derrill Memo ON 03/20/2021] divalproex (DEPAKOTE ER) 24 hr tablet 750 mg  750 mg Oral QHS Ivor Costa, MD      . escitalopram (LEXAPRO) tablet 20 mg  20 mg Oral Daily Ivor Costa, MD   20 mg at 03/09/21 0810  . famotidine (PEPCID) tablet 40 mg  40 mg Oral QPM Ivor Costa, MD   40 mg at 03/08/21 1733  . feeding supplement (ENSURE ENLIVE / ENSURE PLUS) liquid 237 mL  237 mL Oral TID BM Ivor Costa, MD   237 mL at 03/09/21 1249  . [START ON 03/13/2021] gabapentin (NEURONTIN) capsule 300 mg  300 mg Oral Q0600 Ivor Costa, MD      . gabapentin (NEURONTIN) capsule 600 mg  600 mg Oral QHS Ivor Costa, MD   600 mg at 03/08/21 2017  . heparin injection 5,000 Units  5,000 Units Subcutaneous Cleophas Dunker, MD   5,000 Units at 03/09/21 1247  . hydrALAZINE (APRESOLINE) injection 5 mg  5 mg Intravenous Q2H PRN Ivor Costa, MD      . HYDROmorphone (DILAUDID) injection 1 mg  1 mg Intravenous Q4H PRN Nicole Kindred A, DO   1 mg at 03/09/21 0328  . insulin aspart (novoLOG) injection 0-5 Units  0-5 Units Subcutaneous QHS Ivor Costa, MD   2 Units at 03/08/21 2204  . insulin aspart (novoLOG) injection 0-9 Units  0-9 Units Subcutaneous TID WC Ivor Costa, MD   3 Units at 03/09/21 1248  . losartan (COZAAR) tablet 100 mg  100 mg Oral Daily Ivor Costa, MD   100 mg at 03/09/21 0810   . methocarbamol (ROBAXIN) tablet 500 mg  500 mg Oral Q6H PRN Ivor Costa, MD   500 mg at 03/09/21 0809  . mirtazapine (REMERON) tablet 15 mg  15 mg Oral QHS PRN Ivor Costa, MD   15 mg at 03/06/21 2020  . multivitamin with minerals tablet 1 tablet  1 tablet Oral Daily Nicole Kindred A, DO   1 tablet at 03/09/21 0810  . ondansetron (ZOFRAN) injection 4 mg  4 mg Intravenous Q8H  PRN Ivor Costa, MD      . oxyCODONE-acetaminophen (PERCOCET/ROXICET) 5-325 MG per tablet 1-2 tablet  1-2 tablet Oral Q4H PRN Nicole Kindred A, DO   2 tablet at 03/09/21 1053  . polyethylene glycol (MIRALAX / GLYCOLAX) packet 17 g  17 g Oral Daily Nicole Kindred A, DO   17 g at 03/09/21 0815  . polyvinyl alcohol (LIQUIFILM TEARS) 1.4 % ophthalmic solution 1 drop  1 drop Both Eyes PRN Ivor Costa, MD      . predniSONE (DELTASONE) tablet 50 mg  50 mg Oral Q breakfast Ivor Costa, MD   50 mg at 03/09/21 0811  . promethazine (PHENERGAN) 12.5 mg in sodium chloride 0.9 % 50 mL IVPB  12.5 mg Intravenous Q6H PRN Nicole Kindred A, DO   Stopped at 03/08/21 0820  . senna-docusate (Senokot-S) tablet 1 tablet  1 tablet Oral QHS PRN Ivor Costa, MD   1 tablet at 03/08/21 2018   Facility-Administered Medications Ordered in Other Encounters  Medication Dose Route Frequency Provider Last Rate Last Admin  . sodium chloride flush (NS) 0.9 % injection 10 mL  10 mL Intravenous PRN Lequita Asal, MD   10 mL at 12/03/20 1138    VITAL SIGNS: BP (!) 151/82 (BP Location: Right Arm)   Pulse 68   Temp 98.2 F (36.8 C)   Resp 16   Ht 6' (1.829 m)   Wt 135 lb (61.2 kg)   SpO2 98%   BMI 18.31 kg/m  Filed Weights   03/06/21 0321  Weight: 135 lb (61.2 kg)    Estimated body mass index is 18.31 kg/m as calculated from the following:   Height as of this encounter: 6' (1.829 m).   Weight as of this encounter: 135 lb (61.2 kg).  LABS: CBC:    Component Value Date/Time   WBC 8.6 03/07/2021 0559   HGB 11.6 (L) 03/07/2021 0559   HCT 35.4  (L) 03/07/2021 0559   PLT 310 03/07/2021 0559   MCV 90.1 03/07/2021 0559   NEUTROABS 6.7 11/26/2020 0925   LYMPHSABS 1.1 11/26/2020 0925   MONOABS 0.9 11/26/2020 0925   EOSABS 0.1 11/26/2020 0925   BASOSABS 0.0 11/26/2020 0925   Comprehensive Metabolic Panel:    Component Value Date/Time   NA 135 03/07/2021 0559   NA 137 12/15/2015 1056   K 3.9 03/07/2021 0559   CL 97 (L) 03/07/2021 0559   CO2 30 03/07/2021 0559   BUN 24 (H) 03/07/2021 0559   BUN 8 12/15/2015 1056   CREATININE 0.76 03/07/2021 0559   CREATININE 1.07 06/16/2016 1632   GLUCOSE 145 (H) 03/07/2021 0559   CALCIUM 9.7 03/07/2021 0559   AST 25 11/26/2020 0925   ALT 24 11/26/2020 0925   ALKPHOS 77 11/26/2020 0925   BILITOT 0.8 11/26/2020 0925   BILITOT 0.5 12/15/2015 1056   PROT 7.8 11/26/2020 0925   PROT 6.5 12/15/2015 1056   ALBUMIN 4.1 11/26/2020 0925   ALBUMIN 4.2 12/15/2015 1056    RADIOGRAPHIC STUDIES: CT HEAD WO CONTRAST  Result Date: 02/24/2021 CLINICAL DATA:  Headache, history of nasopharyngeal cancer EXAM: CT HEAD WITHOUT CONTRAST TECHNIQUE: Contiguous axial images were obtained from the base of the skull through the vertex without intravenous contrast. COMPARISON:  PET-CT February 14, 2021 FINDINGS: Brain: No evidence of acute territorial infarction, hemorrhage, hydrocephalus,extra-axial collection or mass lesion/mass effect. There is dilatation the ventricles and sulci consistent with age-related atrophy. Low-attenuation changes in the deep white matter consistent with small vessel ischemia. Vascular:  No hyperdense vessel or unexpected calcification. Skull: A large heterogeneous mass seen within the area of the sphenoid sinus with surrounding cortical destruction involving the sphenoid sinuses and extending into the clivus. Sinuses/Orbits: As described above a heterogeneous mass within the sphenoid sinuses extending into the nasopharynx. Other: None IMPRESSION: Heterogeneous mass within the sphenoid sinus  extending into the nasopharynx causing osseous destruction of the sphenoid sinuses and clivus, unchanged from prior PET-CT. Findings consistent with age related atrophy and chronic small vessel ischemia Electronically Signed   By: Prudencio Pair M.D.   On: 02/24/2021 17:46   MR BRAIN W WO CONTRAST  Result Date: 03/06/2021 CLINICAL DATA:  Headache. History of nasopharyngeal carcinoma post treatment. EXAM: MRI HEAD WITHOUT AND WITH CONTRAST TECHNIQUE: Multiplanar, multiecho pulse sequences of the brain and surrounding structures were obtained without and with intravenous contrast. CONTRAST:  83m GADAVIST GADOBUTROL 1 MMOL/ML IV SOLN COMPARISON:  CT head 02/24/2021.  MRI head 08/13/2020 FINDINGS: Brain: Moderate generalized atrophy. Ventricular enlargement chronic and unchanged from prior studies. Extensive white matter hyperintensity bilaterally. Mild patchy hyperintensity in the pons. Negative for acute infarct, hemorrhage, mass. Negative for metastatic disease to the brain. Vascular: Normal arterial flow voids. Skull and upper cervical spine: Abnormal skull base described below. Otherwise negative calvarium. Sinuses/Orbits: Large nasopharyngeal mass on the prior study now shows central necrosis and peripheral irregular enhancement. This extends throughout the sphenoid sinus and into the clivus. This is the initial post treatment study and continued follow-up is warranted. Bilateral mastoid effusion. Bilateral cataract extraction Other: None IMPRESSION: Atrophy and extensive chronic microvascular ischemia. No acute infarct negative for metastatic disease to the brain Treated large nasopharyngeal mass now shows central fluid intensity and peripheral enhancement filling the nasopharynx extending into the sphenoid sinus and clivus. Continued imaging follow-up recommended. Electronically Signed   By: CFranchot GalloM.D.   On: 03/06/2021 13:59   UKoreaCarotid Duplex Right  Result Date: 03/09/2021 CLINICAL DATA:   Evaluate for giant cell arteritis. Area of concern is on the right side. EXAM: EXTRACRANIAL ARTERIAL DUPLEX LIMITED - TEMPORAL ARTERIES TECHNIQUE: GPearline Cablesscale imaging, color Doppler and duplex ultrasound was performed of the bilateral temporal arteries. COMPARISON:  MRI head 03/06/2021 FINDINGS: Right temporal artery is patent with a normal high resistance waveform. There is no evidence for right temporal artery wall thickening and no evidence for a halo sign. Images of the left temporal artery were obtained for comparison. Normal appearance of the left temporal artery. Evidence for wall atherosclerotic calcifications within the right temporal artery. IMPRESSION: Normal appearance of the bilateral temporal arteries with exception of mild atherosclerotic disease. No evidence for giant cell arteritis by sonography. Electronically Signed   By: AMarkus DaftM.D.   On: 03/09/2021 11:21   NM PET Image Restage (PS) Whole Body  Result Date: 02/22/2021 CLINICAL DATA:  Subsequent treatment strategy for nasopharyngeal carcinoma. EXAM: NUCLEAR MEDICINE PET WHOLE BODY TECHNIQUE: 8.6 mCi F-18 FDG was injected intravenously. Full-ring PET imaging was performed from the head to foot after the radiotracer. CT data was obtained and used for attenuation correction and anatomic localization. Fasting blood glucose: 114 mg/dl COMPARISON:  08/18/2020 FINDINGS: Mediastinal blood pool activity: SUV max 3.03 HEAD/NECK: Again noted is the previously characterized FDG avid infiltrative mass centered in the region of the sphenoid sinus and involving the posterior nasal pharynx with destructive changes of the skull base. Compared with previous exam there has been interval decrease in extent of FDG avid tumor. Based off the PET images residual FDG  avid tumor measures approximately 2.1 x 2.6 by 1.5 cm (volume = 4.3 cm^3) and has SUV max 8.32. On the previous exam this mass measured 5.7 x 4.2 by 3.36 (volume = 42) and had an SUV max 11.38. No FDG  avid lymph nodes within the soft tissues of the neck. Incidental CT findings: none CHEST: No FDG avid supraclavicular, axillary, mediastinal, or hilar lymph nodes. No FDG avid pulmonary nodules identified. Nonspecific FDG uptake localizing to the esophagus may reflect underlying esophagitis. No distinct mass identified. Incidental CT findings: Aortic atherosclerosis. Coronary artery atherosclerotic calcifications identified. ABDOMEN/PELVIS: No abnormal hypermetabolic activity within the liver, pancreas, adrenal glands, or spleen. No hypermetabolic lymph nodes in the abdomen or pelvis. Incidental CT findings: Splenic granulomas identified. A extensive aortic atherosclerosis with branch vessel involvement. Distal colonic diverticulosis noted without signs of acute inflammation SKELETON: No focal hypermetabolic activity to suggest skeletal metastasis. Incidental CT findings: none EXTREMITIES: No abnormal hypermetabolic activity in the lower extremities. Incidental CT findings: none IMPRESSION: 1. Interval decrease in size and degree of FDG uptake associated with the FDG avid mass involving the sphenoid sinus, posterior nasopharynx and skull base. 2. No signs of locoregional lymphadenopathy or distant metastatic disease. 3. Increased tracer uptake identified within the esophagus. Correlate for any clinical signs or symptoms of esophagitis. Electronically Signed   By: Kerby Moors M.D.   On: 02/22/2021 12:57    PERFORMANCE STATUS (ECOG) : 2 - Symptomatic, <50% confined to bed  Review of Systems Unless otherwise noted, a complete review of systems is negative.  Physical Exam General: NAD Pulmonary: Unlabored Extremities: no edema, no joint deformities Skin: no rashes Neurological: Weakness but otherwise nonfocal  IMPRESSION: I met with patient and his stepdaughter.  Patient endorses persistent pain in the frontal area that extends down into his right orbit and down into his teeth.  He describes the pain  as being sharp and currently rates as 8 out of 10.  Pain has been as high as 10 out of 10.  He currently feels it is improved overall.  Neurology is following and actively managing his symptoms.  Patient is on a variety of medications including gabapentin, Tegretol, Depakote, phenytoin, hydromorphone, Robaxin, and Percocet.  Patient is unable to identify any particular medication or treatment that has been most effective.  His stepdaughter feels that patient has had good relief from IV morphine in the past.  Could consider trial of MS Contin for long-acting control.  Prior to this hospitalization, patient was living at home alone.  He was mostly independent with care but had some assistance from his stepdaughter when needed.  Disposition is unclear at the present time.  PT is recommending SNF.  I would suggest that patient be followed at rehab by palliative care.  Patient does not have any advance directives, although stepdaughter says that he has paperwork at home, which he has yet to complete.  Patient is interested in completing ACP documents if possible.  I will consult the chaplain to assist.  We discussed CODE STATUS.  His stepdaughter says that this has been previously discussed with family and patient confirmed today that he would not want to be resuscitated or have his life prolonged artificially on machines.  He was in agreement with DNR/DNI.  PLAN: -Continue current scope of treatment -Neurology managing symptoms -Dispo: Probable SNF with palliative care following -DNR/DNI -Chaplain to complete ACP documents   Time Total: 60 minutes  Visit consisted of counseling and education dealing with the complex and emotionally  intense issues of symptom management and palliative care in the setting of serious and potentially life-threatening illness.Greater than 50%  of this time was spent counseling and coordinating care related to the above assessment and plan.  Signed by: Altha Harm,  PhD, NP-C

## 2021-03-09 NOTE — Progress Notes (Signed)
[facility] Progress Note  Date of admission: 03/06/2021  Hospital day:  03/08/2021  Chief Complaint: Kenneth Franklin is a 75 y.o. 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  Subjective: Currently feels better. Continues to have episodes of sever right sided headache.  Social History: The patient is alone today.  Allergies:  Allergies  Allergen Reactions  . Propoxyphene     Unknown reaction    Scheduled Medications: . aspirin EC  81 mg Oral Daily  . atorvastatin  80 mg Oral QPM  . busPIRone  10 mg Oral QHS  . clonazepam  0.5 mg Oral TID  . clopidogrel  75 mg Oral Daily  . divalproex  250 mg Oral QHS  . [START ON 03/13/2021] divalproex  500 mg Oral QHS  . [START ON 03/20/2021] divalproex  750 mg Oral QHS  . escitalopram  20 mg Oral Daily  . famotidine  40 mg Oral QPM  . feeding supplement  237 mL Oral TID BM  . [START ON 03/13/2021] gabapentin  300 mg Oral Q0600  . gabapentin  600 mg Oral QHS  . heparin  5,000 Units Subcutaneous Q8H  . insulin aspart  0-5 Units Subcutaneous QHS  . insulin aspart  0-9 Units Subcutaneous TID WC  . losartan  100 mg Oral Daily  . multivitamin with minerals  1 tablet Oral Daily  . polyethylene glycol  17 g Oral Daily  . predniSONE  50 mg Oral Q breakfast    Review of Systems  Constitutional: Positive for malaise/fatigue. Negative for chills, fever and weight loss.  HENT: Negative for congestion, nosebleeds, sinus pain, sore throat and tinnitus.   Eyes: Negative.  Negative for blurred vision, double vision, photophobia and pain.  Respiratory: Negative.  Negative for cough, sputum production and shortness of breath.   Cardiovascular: Negative.  Negative for chest pain, palpitations, orthopnea and leg swelling.  Gastrointestinal: Negative.  Negative for abdominal pain, constipation, diarrhea, heartburn,  nausea and vomiting.  Genitourinary: Negative.  Negative for dysuria, frequency and urgency.  Musculoskeletal: Negative.  Negative for myalgias and neck pain.  Skin: Negative.  Negative for rash.  Neurological: Positive for weakness (general) and headaches. Negative for dizziness, tingling, sensory change, speech change, focal weakness and seizures.       Right frontal headache persists, but with periods of diminished pain.  Endo/Heme/Allergies: Negative.  Does not bruise/bleed easily.  Psychiatric/Behavioral: Negative.  Negative for depression and memory loss. The patient is not nervous/anxious.   All other systems reviewed and are negative.   Vitals: Blood pressure (!) 151/63, pulse (!) 56, temperature (!) 97.4 F (36.3 C), temperature source Oral, resp. rate 19, height 6' (1.829 m), weight 135 lb (61.2 kg), SpO2 99 %.   Physical Exam Vitals reviewed.  Constitutional:      General: He is not in acute distress.    Appearance: He is not ill-appearing.     Comments: Fatigued appearing gentleman sitting up in bed in no acute distress.  HENT:     Head: Normocephalic and atraumatic.     Mouth/Throat:     Mouth: Mucous membranes are moist.     Pharynx: Oropharynx is clear.  Eyes:     General: No visual field deficit or scleral icterus.    Extraocular Movements: Extraocular movements intact.     Right eye: No nystagmus.     Pupils: Pupils are equal, round, and reactive  to light.  Cardiovascular:     Rate and Rhythm: Normal rate and regular rhythm.  Pulmonary:     Effort: Pulmonary effort is normal. No respiratory distress.     Breath sounds: Normal breath sounds. No wheezing, rhonchi or rales.  Abdominal:     General: Bowel sounds are normal. There is no distension.     Palpations: Abdomen is soft. There is no mass.     Tenderness: There is no abdominal tenderness.  Musculoskeletal:        General: No swelling.     Cervical back: Normal range of motion and neck supple.   Lymphadenopathy:     Cervical: No cervical adenopathy.  Skin:    General: Skin is warm and dry.     Coloration: Skin is not cyanotic.     Findings: No erythema or rash.  Neurological:     Mental Status: He is alert and oriented to person, place, and time.     Cranial Nerves: No cranial nerve deficit, dysarthria or facial asymmetry.  Psychiatric:        Mood and Affect: Mood normal. Mood is not anxious.        Speech: Speech normal.        Behavior: Behavior normal. Behavior is not agitated.      Results for orders placed or performed during the hospital encounter of 03/06/21 (from the past 48 hour(s))  Basic metabolic panel     Status: Abnormal   Collection Time: 03/07/21  5:59 AM  Result Value Ref Range   Sodium 135 135 - 145 mmol/L   Potassium 3.9 3.5 - 5.1 mmol/L   Chloride 97 (L) 98 - 111 mmol/L   CO2 30 22 - 32 mmol/L   Glucose, Bld 145 (H) 70 - 99 mg/dL    Comment: Glucose reference range applies only to samples taken after fasting for at least 8 hours.   BUN 24 (H) 8 - 23 mg/dL   Creatinine, Ser 0.76 0.61 - 1.24 mg/dL   Calcium 9.7 8.9 - 10.3 mg/dL   GFR, Estimated >60 >60 mL/min    Comment: (NOTE) Calculated using the CKD-EPI Creatinine Equation (2021)    Anion gap 8 5 - 15    Comment: Performed at [facility], [address] [place], [place] [address]  CBC     Status: Abnormal   Collection Time: 03/07/21  5:59 AM  Result Value Ref Range   WBC 8.6 4.0 - 10.5 K/uL   RBC 3.93 (L) 4.22 - 5.81 MIL/uL   Hemoglobin 11.6 (L) 13.0 - 17.0 g/dL   HCT 35.4 (L) 39.0 - 52.0 %   MCV 90.1 80.0 - 100.0 fL   MCH 29.5 26.0 - 34.0 pg   MCHC 32.8 30.0 - 36.0 g/dL   RDW 13.4 11.5 - 15.5 %   Platelets 310 150 - 400 K/uL   nRBC 0.0 0.0 - 0.2 %    Comment: Performed at [facility], [address] [place], [place] [address]  Magnesium     Status: None   Collection Time: 03/07/21  5:59 AM  Result Value Ref Range   Magnesium 2.0 1.7 - 2.4 mg/dL    Comment:  Performed at [facility], [address] [place], [place] [address]  Glucose, capillary     Status: Abnormal   Collection Time: 03/07/21  8:17 AM  Result Value Ref Range   Glucose-Capillary 150 (H) 70 - 99 mg/dL    Comment: Glucose reference  range applies only to samples taken after fasting for at least 8 hours.  Glucose, capillary     Status: Abnormal   Collection Time: 03/07/21 11:30 AM  Result Value Ref Range   Glucose-Capillary 251 (H) 70 - 99 mg/dL    Comment: Glucose reference range applies only to samples taken after fasting for at least 8 hours.  Glucose, capillary     Status: Abnormal   Collection Time: 03/07/21  3:54 PM  Result Value Ref Range   Glucose-Capillary 237 (H) 70 - 99 mg/dL    Comment: Glucose reference range applies only to samples taken after fasting for at least 8 hours.  Glucose, capillary     Status: Abnormal   Collection Time: 03/07/21  9:06 PM  Result Value Ref Range   Glucose-Capillary 232 (H) 70 - 99 mg/dL    Comment: Glucose reference range applies only to samples taken after fasting for at least 8 hours.  Glucose, capillary     Status: Abnormal   Collection Time: 03/08/21  9:29 AM  Result Value Ref Range   Glucose-Capillary 173 (H) 70 - 99 mg/dL    Comment: Glucose reference range applies only to samples taken after fasting for at least 8 hours.  Glucose, capillary     Status: Abnormal   Collection Time: 03/08/21 12:14 PM  Result Value Ref Range   Glucose-Capillary 203 (H) 70 - 99 mg/dL    Comment: Glucose reference range applies only to samples taken after fasting for at least 8 hours.  Glucose, capillary     Status: Abnormal   Collection Time: 03/08/21  3:33 PM  Result Value Ref Range   Glucose-Capillary 280 (H) 70 - 99 mg/dL    Comment: Glucose reference range applies only to samples taken after fasting for at least 8 hours.  Glucose, capillary     Status: Abnormal   Collection Time: 03/08/21  9:52 PM  Result Value Ref Range    Glucose-Capillary 204 (H) 70 - 99 mg/dL    Comment: Glucose reference range applies only to samples taken after fasting for at least 8 hours.   No results found.  Assessment:  Kenneth Franklin is a 75 y.o. male wwith stage III nasopharyngeal carcinoma s/p concurrent chemotherapy and radiation (completed 10/21/2020) who is admitted through the emergency room with intractable headache.  He was admitted with intractable headache x 15 days.  Pain is unresponsive to outpatient management.  Head MRI on 08/13/2020 revealed a 5.9 cm nasopharyngeal mass involvement of the clivus and sphenoid sinus.  PET scan on 02/22/2021 revealed interval decrease in the size and FDG uptake associated with the mass involving the sphenoid sinus, posterior nasopharynx and skull base.  Head MRI on 03/06/2021 revealed atrophy and extensive chronic microvascular ischemia.  There was no acute infarct evidence of metastatic disease.  The treated large nasopharyngeal mass showed central fluid intensity and peripheral enhancement filling the nasopharynx extending into the sphenoid sinus and clivus.  Review of imaging with radiology revealed no clear evidence of progressive disease.  Symptomatically, he continues to have a right sided headache, but periods of less intensity.  Plan:   1.   Stage III nasopharyngeal carcinoma             Patient completed current cisplatin and radiation on 10/21/2020.             He did not receive additional adjuvant chemotherapy.             He initially presented  with his diagnosis with a headache.             Head MRI on 03/06/2021 with no apparent progression from imaging from 08/13/2020. 2.   Intractable headache             Differential diagnosis includes perineural spread of squamous cell carcinoma, temporal arteritis and trigeminal neuralgia.                         Sed rate was 78 (0-20) and CRP 2.1 (< 1.0).             Patient's pain appears to be better controlled with ATC pain  medications.  Patient currently on Depakote, gabapentin, oxycodone, prednisone and IV Dilaudid.  He received IV lidocaine on 03/07/2021 for diagnostic and therapeutic purposes.  He received Fosphenytoin x 1 per neurology today.  Plan for temporal artery ultrasound to evaluate for halo sign. 3.  Disposition  Anticipate ongoing inpatient stay until pain well controlled given multiple ER evaluations.   Lequita Asal, MD  03/09/2021, 2:58 AM   (seen 03/08/2021 at 5:45 PM)

## 2021-03-09 NOTE — Evaluation (Addendum)
Occupational Therapy Evaluation Patient Details Name: Kenneth Franklin MRN: [id number] DOB: July 06, 1946 Today's Date: 03/09/2021    History of Present Illness Pt is a 75 y.o. 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  MD assessment includes: intractable headache, brain atrophy and extensive chronic microvascular ischemia, no acute infarct and negative for metastatic disease to the brain.   Clinical Impression   Kenneth Franklin presents today with generalized weakness, reduced endurance, and 10/10 headache pain. He lives alone, with intermittent assistance from family. Until late 2021, when pt underwent chemo/radiation for stage III nasopharyngeal carcinoma, Kenneth Franklin was IND in all ADL/IADL, active, driving. He reports that, since then, he has felt much more unstable, has discontinued driving, has extreme fatigue, rarely leaves his home, often feels confused, and has no appetite. He has begun to use a SPC or RW for ambulation. During today's evaluation, he requires increased time and effort for bed mobility, requires repeated instructions and increased time to respond to one-step commands, needs greatly increased time for seated UB and LB ADL tasks, and displays poor static standing balance. He describes his headache as being a constant, sharp, shooting pain. He requests additional pain medications, RN notified. Recommend ongoing OT while pt is hospitalized. At present, pt is overwhelmed by his pain and is unable to safely attend to self-care or functional mobility tasks. Unless family can arrange for 24-hour home supervision, DC to a SNF will likely be required.     Follow Up Recommendations  SNF    Equipment Recommendations       Recommendations for Other Services       Precautions / Restrictions  Precautions Precautions: Fall Restrictions Weight Bearing Restrictions: No      Mobility Bed Mobility Overal bed mobility: Needs Assistance Bed Mobility: Supine to Sit;Sit to Supine     Supine to sit: Supervision Sit to supine: Supervision   General bed mobility comments: requires increased time and effort    Transfers Overall transfer level: Needs assistance Equipment used: Rolling walker (2 wheeled) Transfers: Sit to/from Stand Sit to Stand: Mod assist         General transfer comment: Mod A to prevent posterior LOB while in standing    Balance Overall balance assessment: Needs assistance   Sitting balance-Leahy Scale: Poor Sitting balance - Comments: R lateral lean in sitting with pt unable to remain upright with even min perturbation to the R   Standing balance support: Bilateral upper extremity supported;During functional activity Standing balance-Leahy Scale: Poor Standing balance comment: constant Mod A to prevent posterior LOB                           ADL either performed or assessed with clinical judgement   ADL                                               Vision         Perception     Praxis      Pertinent Vitals/Pain Pain Assessment: 0-10 Pain Score: 8  Pain Location: R sided HA Pain Descriptors / Indicators: Headache Pain Intervention(s): Premedicated before session;Monitored during session     Hand Dominance  Extremity/Trunk Assessment Upper Extremity Assessment Upper Extremity Assessment: Generalized weakness (Generalized weakness but equal L/R with sensation to light touch WNL)   Lower Extremity Assessment Lower Extremity Assessment: Generalized weakness (Generalized weakness but equal L/R with sensation to light touch WNL)       Communication Communication Communication: HOH   Cognition Arousal/Alertness: Lethargic Behavior During Therapy: WFL for tasks assessed/performed Overall Cognitive  Status: Within Functional Limits for tasks assessed                                     General Comments       Exercises Total Joint Exercises Ankle Circles/Pumps: AROM;Both;10 reps Quad Sets: Strengthening;Both;10 reps Gluteal Sets: 10 reps;Both;Strengthening Hip ABduction/ADduction: Strengthening;Both;10 reps Straight Leg Raises: 10 reps;Both;Strengthening Long Arc Quad: AROM;Strengthening;Both;10 reps Knee Flexion: AROM;Strengthening;Both;10 reps Other Exercises Other Exercises: Left lateral weight shifting in sitting to address R lateral lean with no carryover Other Exercises: Anterior weight shifting activities in standing to address posterior instability with no carryover   Shoulder Instructions      Home Living Family/patient expects to be discharged to:: Private residence Living Arrangements: Alone Available Help at Discharge: Available PRN/intermittently Type of Home: House Home Access: Ramped entrance     Home Layout: One level     Bathroom Shower/Tub: Teacher, early years/pre: Standard     Home Equipment: Walker - standard;Cane - single point;Walker - 2 wheels          Prior Functioning/Environment Level of Independence: Needs assistance  Gait / Transfers Assistance Needed: Ind amb limited community distances without an AD, occasional SPC PRN when feeling weak, no fall history, Ind with ADLs ADL's / Homemaking Assistance Needed: Ind with dressing, bathing, housework, except reports stepdaughter brings him food daily. Pt has given up driving following chemo and radiation.            OT Problem List: Decreased strength;Impaired vision/perception;Decreased coordination;Decreased range of motion;Decreased activity tolerance;Impaired balance (sitting and/or standing);Pain      OT Treatment/Interventions: Self-care/ADL training;Patient/family education;Therapeutic exercise;Balance training;Energy conservation;Therapeutic activities     OT Goals(Current goals can be found in the care plan section) Acute Rehab OT Goals Patient Stated Goal: To be stronger and to be able to do more OT Goal Formulation: With patient Time For Goal Achievement: 03/23/21 Potential to Achieve Goals: Good  OT Frequency: Min 1X/week   Barriers to D/C: Decreased caregiver support  pt lives alone, with family help available intermittently       Co-evaluation              AM-PAC OT "6 Clicks" Daily Activity     Outcome Measure Help from another person eating meals?: A Little Help from another person taking care of personal grooming?: A Little Help from another person toileting, which includes using toliet, bedpan, or urinal?: A Lot Help from another person bathing (including washing, rinsing, drying)?: A Lot Help from another person to put on and taking off regular upper body clothing?: A Lot Help from another person to put on and taking off regular lower body clothing?: A Lot 6 Click Score: 14   End of Session Nurse Communication: Patient requests pain meds  Activity Tolerance: Patient limited by pain Patient left: in bed;with call bell/phone within reach;with bed alarm set;with nursing/sitter in room  OT Visit Diagnosis: Unsteadiness on feet (R26.81);Pain;Muscle weakness (generalized) (M62.81)  Time: 7829-5621 OT Time Calculation (min): 21 min Charges:  OT General Charges $OT Visit: 1 Visit OT Evaluation $OT Eval Moderate Complexity: 1 Mod OT Treatments $Self Care/Home Management : 8-22 mins  Josiah Lobo, PhD, MS, OTR/L 03/09/21, 2:01 PM

## 2021-03-10 ENCOUNTER — Telehealth: Payer: Self-pay | Admitting: *Deleted

## 2021-03-10 DIAGNOSIS — G44009 Cluster headache syndrome, unspecified, not intractable: Secondary | ICD-10-CM

## 2021-03-10 LAB — GLUCOSE, CAPILLARY
Glucose-Capillary: 154 mg/dL — ABNORMAL HIGH (ref 70–99)
Glucose-Capillary: 160 mg/dL — ABNORMAL HIGH (ref 70–99)
Glucose-Capillary: 124 mg/dL — ABNORMAL HIGH (ref 70–99)
Glucose-Capillary: 153 mg/dL — ABNORMAL HIGH (ref 70–99)

## 2021-03-10 LAB — BASIC METABOLIC PANEL
Anion gap: 13 (ref 5–15)
BUN: 18 mg/dL (ref 8–23)
CO2: 29 mmol/L (ref 22–32)
Calcium: 9.7 mg/dL (ref 8.9–10.3)
Chloride: 94 mmol/L — ABNORMAL LOW (ref 98–111)
Creatinine, Ser: 0.72 mg/dL (ref 0.61–1.24)
GFR, Estimated: >60 mL/min (ref >60)
Glucose, Bld: 127 mg/dL — ABNORMAL HIGH (ref 70–99)
Potassium: 3.9 mmol/L (ref 3.5–5.1)
Sodium: 136 mmol/L (ref 135–145)

## 2021-03-10 LAB — PHENYTOIN LEVEL, FREE AND TOTAL
Phenytoin, Free: 0.9 ug/mL — ABNORMAL LOW (ref 1.0–2.0)
Phenytoin, Total: 6 ug/mL — ABNORMAL LOW (ref 10.0–20.0)

## 2021-03-10 MED ORDER — CARBAMAZEPINE 100 MG PO CHEW
100.0000 mg | CHEWABLE_TABLET | Freq: Four times a day (QID) | ORAL | Status: DC
  Administered 2021-03-10 – 2021-03-13 (×9): 100 mg via ORAL
  Filled 2021-03-10 (×18): qty 1

## 2021-03-10 NOTE — Telephone Encounter (Signed)
Stepdaughter Otila Kluver called requesting a return call to give her insight of what is going on with patient. She was told by RN that he will stay in hospital until they can figure out what medications will control his pain. She states that Monday and Tuesday he was very sleepy, but knew who she was and today he is writhing in pain and asking for her mother who has been deceased for 5 years. She requests a return call

## 2021-03-10 NOTE — Plan of Care (Signed)
End of Shift Summary:  Alert and oriented x3, reoriented as needed. Remained on room air, sats >97%. Other VSS. Pt c/o 10/10 pain overnight, able to get short bouts of rest with prn medications. Denies n/v. Remained free from falls or injury. Call bell within reach and able to use.   Problem: Education: Goal: Knowledge of General Education information will improve Description: Including pain rating scale, medication(s)/side effects and non-pharmacologic comfort measures Outcome: Progressing   Problem: Clinical Measurements: Goal: Ability to maintain clinical measurements within normal limits will improve Outcome: Progressing   Problem: Nutrition: Goal: Adequate nutrition will be maintained Outcome: Progressing   Problem: Pain Managment: Goal: General experience of comfort will improve Outcome: Progressing   Problem: Safety: Goal: Ability to remain free from injury will improve Outcome: Progressing

## 2021-03-10 NOTE — Clinical Social Work Note (Signed)
This CSW working remote today. Tried calling patient in room but no answer. Will try again later to discuss SNF recommendation.  [name] Scrape, [place]

## 2021-03-10 NOTE — Progress Notes (Signed)
PROGRESS NOTE   Kenneth Franklin  IZT:[id number] DOB: 1946-02-12 DOA: 03/06/2021 PCP: Verita Lamb, NP  Brief Narrative:   75 y.o. 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  nasopharyngeal cancer since September 2021 after presenting with a 65-month persistent headache.  chemo and radiation therapy, finished on 10/21/2020--declined further adjuvant therapy.  He had PET scan 02/22/2021 showing interval decrease in the size and FDG uptake of the mass.  [facility] neurolog[facility] evaluated 03/02/2021--increased gabapentin, added Depakote (?  Giant cell arteritis) headaches DDx perineural spread of his malignancy.   MRI here on 03/06/2021 revealed no clear evidence of progressive disease however.  Admitted to hospitalist service with Neurology consulted.  Oncology is following as well.    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  Giant cell arteritis/temporal arteritis remains in the differential however.  Ultrasound of temporal artery to look for halo sign is planned per neurology.  Patient received IV lidocaine infusion on 4/10 for both diagnostic and therapeutic purposes.   He has had no response to several agents and it was discussed with neurology next steps-we consulted Dr. Zollie Scale of pain management who only performs outpatient sphenopalatine blocks We have put out a call to [facility] where patient receives some of his care for transfer for interventional pain management on the inpatient side  Hospital-Problem based course Intractable pain secondary to nasopharyngeal carcinoma Current meds Tegretol 100 every 8 Klonopin 0.25 3 times daily Depakote 750 PM in addition to  gabapentin 600 p.m. and gabapentin 300 daily and Robaxin 500 every 6 as needed No response to intravenous lidocaine infusion, no response to IV fosphenytoin either Have called [facility] transfer center line to see if he would be a candidate for rhizotomy or sphenopalatine ganglion block as pain management here in [place] cannot perform this in the inpatient setting-await callback Continue Dilaudid 1 mg every 4 as needed Appreciate neurology oncology input Nasal pharyngeal carcinoma Imaging earlier in admission refutes metastatic spread Pain is presumed to be likely secondary to this Temporal arteritis ruled out No halo sign on ultrasound History of stroke Continue aspirin Plavix Lipitor 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 Continue sliding scale coverage-sugars 1 24-1 50 patient not eating however Depression Continue BuSpar 10 at bedtime, Lexapro 20 daily, Remeron 15 at bedtime as needed HTN Continue losartan 100 daily, hydralazine 5 mg every 2 as needed  DVT prophylaxis: Heparin Code Status: DNR confirmed Family Communication: Called Daughter Otila Kluver [phone] to update completely--left voicemail Disposition:  Status is: Inpatient  Remains inpatient appropriate because:IV treatments appropriate due to intensity of illness or inability to take PO   Dispo: The patient is from: Home              Anticipated d/c is to: Needs transfer to tertiary care center              Patient currently is not medically stable to d/c.   Difficult to place patient No   Consultants:   Neurology  Oncology  Interventional pain management telephone consulted  Procedures: Multiple  Antimicrobials: None   Subjective: Patient is in intractable pain Nursing reports has been medicated around-the-clock I am unable to get much history out  of him although he is coherent  Objective: Vitals:   03/09/21 2032 03/10/21 0425 03/10/21 0742 03/10/21 1500  BP: 104/63 (!) 163/70 125/68 132/73  Pulse: 63 71 67 65  Resp: 18 17  16 16   Temp: 98.1 F (36.7 C) 99 F (37.2 C) 98 F (36.7 C) 98.1 F (36.7 C)  TempSrc:  Oral  Oral  SpO2: 96% 96% 97% 98%  Weight:      Height:        Intake/Output Summary (Last 24 hours) at 03/10/2021 1705 Last data filed at 03/10/2021 1449 Gross per 24 hour  Intake 120 ml  Output --  Net 120 ml   Filed Weights   03/06/21 0321  Weight: 61.2 kg    Examination:  Asthenic white male looks older than stated age Supraclavicular bitemporal wasting Poor dentition S1-S2 slightly tachycardic Abdomen scaphoid no rebound no guarding Chest clear posterolaterally   Data Reviewed: personally reviewed   CBC    Component Value Date/Time   WBC 8.6 03/07/2021 0559   RBC 3.93 (L) 03/07/2021 0559   HGB 11.6 (L) 03/07/2021 0559   HCT 35.4 (L) 03/07/2021 0559   PLT 310 03/07/2021 0559   MCV 90.1 03/07/2021 0559   MCH 29.5 03/07/2021 0559   MCHC 32.8 03/07/2021 0559   RDW 13.4 03/07/2021 0559   LYMPHSABS 1.1 11/26/2020 0925   MONOABS 0.9 11/26/2020 0925   EOSABS 0.1 11/26/2020 0925   BASOSABS 0.0 11/26/2020 0925   CMP Latest Ref Rng & Units 03/10/2021 03/07/2021 03/06/2021  Glucose 70 - 99 mg/dL 127(H) 145(H) 251(H)  BUN 8 - 23 mg/dL 18 24(H) 26(H)  Creatinine 0.61 - 1.24 mg/dL 0.72 0.76 0.91  Sodium 135 - 145 mmol/L 136 135 133(L)  Potassium 3.5 - 5.1 mmol/L 3.9 3.9 4.9  Chloride 98 - 111 mmol/L 94(L) 97(L) 96(L)  CO2 22 - 32 mmol/L 29 30 28   Calcium 8.9 - 10.3 mg/dL 9.7 9.7 9.8  Total Protein 6.5 - 8.1 g/dL - - -  Total Bilirubin 0.3 - 1.2 mg/dL - - -  Alkaline Phos 38 - 126 U/L - - -  AST 15 - 41 U/L - - -  ALT 0 - 44 U/L - - -     Radiology Studies: US Carotid Duplex Right  Result Date: 03/09/2021 CLINICAL DATA:  Evaluate for giant cell arteritis. Area of concern is on the right side. EXAM: EXTRACRANIAL ARTERIAL DUPLEX LIMITED - TEMPORAL ARTERIES TECHNIQUE: Pearline Cables scale imaging, color Doppler and duplex ultrasound was performed of the bilateral temporal arteries.  COMPARISON:  MRI head 03/06/2021 FINDINGS: Right temporal artery is patent with a normal high resistance waveform. There is no evidence for right temporal artery wall thickening and no evidence for a halo sign. Images of the left temporal artery were obtained for comparison. Normal appearance of the left temporal artery. Evidence for wall atherosclerotic calcifications within the right temporal artery. IMPRESSION: Normal appearance of the bilateral temporal arteries with exception of mild atherosclerotic disease. No evidence for giant cell arteritis by sonography. Electronically Signed   By: Markus Daft M.D.   On: 03/09/2021 11:21     Scheduled Meds: . aspirin EC  81 mg Oral Daily  . atorvastatin  80 mg Oral QPM  . busPIRone  10 mg Oral QHS  . carbamazepine  100 mg Oral Q8H  . clonazepam  0.25 mg Oral TID  . clopidogrel  75 mg Oral Daily  . divalproex  250 mg Oral QHS  . [START ON  03/13/2021] divalproex  500 mg Oral QHS  . [START ON 03/20/2021] divalproex  750 mg Oral QHS  . escitalopram  20 mg Oral Daily  . famotidine  40 mg Oral QPM  . feeding supplement  237 mL Oral TID BM  . [START ON 03/13/2021] gabapentin  300 mg Oral Q0600  . gabapentin  600 mg Oral QHS  . heparin  5,000 Units Subcutaneous Q8H  . insulin aspart  0-5 Units Subcutaneous QHS  . insulin aspart  0-9 Units Subcutaneous TID WC  . losartan  100 mg Oral Daily  . multivitamin with minerals  1 tablet Oral Daily  . polyethylene glycol  17 g Oral Daily   Continuous Infusions: . promethazine (PHENERGAN) injection (IM or IVPB) Stopped (03/08/21 0820)     LOS: 3 days   Time spent: 45 minutes including care coordination time and call to [facility] for transfer  [name], MD [facility] To contact the attending provider between 7A-7P or the covering provider during after hours 7P-7A, please log into the web site [email or web address] and access using universal [place] password for that web site.  If you do not have the password, please call the hospital operator.  03/10/2021, 5:05 PM

## 2021-03-10 NOTE — Progress Notes (Signed)
NEUROLOGY CONSULTATION PROGRESS NOTE   Date of service: March 10, 2021 Patient Name: Kenneth Franklin MRN:  [id number] DOB:  Apr 01, 1946  Brief HPI  Kenneth Franklin is a 75 y.o. male with intractable R sided facial pain involving face with hx of Nasopharyngeal Carcinoma treated with radiation and chemotherapy(completed Nov 2021) who p/w 2 months hx of new onset headache predominantly in Right V2 distribution but goes behind ear, temple and outer aspect of his R eye socket. He has a dull ache with brief sharp shooting pain.  Has been seen in ED a few times over the last week. MRI Brain with and without contrast with grossly similar nasopharyngeal malignancy with perineural spead along the R V2 nerve. He was seen in Neurology clinic at [facility] and etiology was unclear. Differential includes atypical facial pain (perineural spread along the right V2 region, poorly evaluated due to motion artifact), cervicogenic headache, hemicrania continua, versus primary stabbing headache.  Gabepentin was increased to 395m in AM and 6035mqhs and Depakote 25032mR was started with plan to increase to 750m50mily over 3 weeks.  He presented to ED with a 20/10 headache with no improvement in symptoms. He was given IV Dilaudid, Toradol, Decadron with recurrent peaks of facial pain, some precipitated by touch. ESR elevated to 80. Repeat MRI Brain here is stable.   Interval Hx   10/10 pain this AM. Tegretol has not helped much. Reached out to Pain for potential SPG block but we only have Pain medicine service available outpatient and they do not see inpatient consults.  Vitals   Vitals:   03/09/21 1546 03/09/21 2032 03/10/21 0425 03/10/21 0742  BP: (!) 151/82 104/63 (!) 163/70 125/68  Pulse: 68 63 71 67  Resp: _0 Temp: 98.2 F (36.8 C) 98.1 F (36.7 C) 99 F (37.2 C) 98 F (36.7 C)  TempSrc:   Oral   SpO2: 98% 96% 96% 97%  Weight:      Height:         Body mass index is 18.31 kg/m.  Physical  Exam   General: Laying comfortably in bed; in no acute distress.  HENT: Normal oropharynx and mucosa. Normal external appearance of ears and nose.  Neck: Supple, no pain or tenderness  CV: No JVD. No peripheral edema.  Pulmonary: Symmetric Chest rise. Normal respiratory effort.  Abdomen: Soft to touch, non-tender.  Ext: No cyanosis, edema, or deformity  Skin: No rash. Normal palpation of skin.   Musculoskeletal: Normal digits and nails by inspection. No clubbing.   Neurologic Examination  Mental status/Cognition: somnolent but also grimacing with pain, oriented to self, place, month and year, good attention. Speech/language: Non fluent speech, comprehension intact Cranial nerves:   CN II Pupils equal and reactive to light, no VF deficits, R palpebral fissure mildly wider than left.   CN III,IV,VI EOM intact, no gaze preference or deviation, no nystagmus    CN V normal sensation in V1, V2, and V3 segments bilaterally    CN VII Mild flattening of nasolabila fold on the Right.   CN VIII normal hearing to speech    CN IX & X normal palatal elevation, no uvular deviation    CN XI 5/5 head turn and 5/5 shoulder shrug bilaterally    CN XII midline tongue protrusion    Motor:  Muscle bulk: poor RUE and LUE: 5/5 to hand grip Wiggles toes.  Sensation:  Light touch Intact throughout   Pin prick  Temperature    Vibration   Proprioception    Coordination/Complex Motor:  - Finger to Nose intact BL  Labs   Basic Metabolic Panel:  Lab Results  Component Value Date   NA 136 03/10/2021   K 3.9 03/10/2021   CO2 29 03/10/2021   GLUCOSE 127 (H) 03/10/2021   BUN 18 03/10/2021   CREATININE 0.72 03/10/2021   CALCIUM 9.7 03/10/2021   GFRNONAA >60 03/10/2021   GFRAA >60 08/18/2020   HbA1c:  Lab Results  Component Value Date   HGBA1C 8.9 (H) 10/25/2020   LDL:  Lab Results  Component Value Date   LDLCALC 88 12/15/2015   Urine Drug Screen:     Component Value Date/Time    LABOPIA NONE DETECTED 06/01/2017 0250   COCAINSCRNUR NONE DETECTED 06/01/2017 0250   LABBENZ NONE DETECTED 06/01/2017 0250   AMPHETMU NONE DETECTED 06/01/2017 0250   THCU NONE DETECTED 06/01/2017 0250   LABBARB NONE DETECTED 06/01/2017 0250    Alcohol Level     Component Value Date/Time   ETH 102 (H) 05/31/2017 1836   No results found for: PHENYTOIN, ZONISAMIDE, LAMOTRIGINE, LEVETIRACETA No results found for: PHENYTOIN, PHENOBARB, VALPROATE, CBMZ  Imaging and Diagnostic studies   MRI Brain with and without contrast:  Atrophy and extensive chronic microvascular ischemia. No acute infarct negative for metastatic disease to the brain  Treated large nasopharyngeal mass now shows central fluid intensity and peripheral enhancement filling the nasopharynx extending into the sphenoid sinus and clivus. Continued imaging follow-up recommended.   Impression   Kenneth Franklin is a 74 y.o. male presenting with intractable R sided facial pain involving V2 mostly but also V1 and V3 with hx of Nasopharyngeal Carcinoma treated with radiation and chemotherapy(completed Nov 2021). The presentation of his facial pain is most consistent with a Cephalalgia rather than GCA. However, do agree that the presentation is not typical of known Cephalalgias. No clear autonomic features noted today and none that I see documented except that he had some runny nose with these headaches in he past. He does endorse having similar pains about 40 years ago and they went away. Althou these could be due to the perineural spread of the Right V2 Nerve but difficult to say. Elevated ESR and CRP initially concerning for a potential GCA, but his temporal artery US was negative for halo sign which has a high negative predictive value and rules out GCA. He has been seen at several EDs in the last 2 weeks.  I suspect that his presentation is most consistent with Cluster tics but still atypical as he does not have obvious autonomic  features. Another consideration is Trigeminal Neuropathy potentially from the perineural spread of the Nasopharyngeal Carcinoma. He has not had any significant pain relief with several medications including Depakote, Gabapentin, IV Fosphenytoin, IV Lidocaine, Opiods, Carbamezapine, Prednisone.  I do think we should explore other options including SPG block, potential decompression of Trigeminal Nerve vs Rhizotomy or Neurectomy. Unfortunately we do not have inhouse specialists that offer these interventions here. I reached out to pain medicine but they do not see inpatient consults here. My concern is that he continues to be sedated on several medications but still continues to have significant pain(10/10). I would like to avoid using Intranasal Lidocaine due to proximity to the Nasopharyngeal Ca.  Recommendations  - Continue Depakote and Neurontin - Continue Klonopin 0.25mg TID - Prednisone discontinued after US Temporal artery was negative for GCA. - Continue with PRN opiods until we have pain   under better control. - No relief with IV fosphenytoin or IV lidocaine - Will increase Carbamezapine to 172m QID. - [place] of potential inpatient interventions including SPG block, Decompression, Neurectomy or Rhizotomy. ______________________________________________________________________   Thank you for the opportunity to take part in the care of this patient. If you have any further questions, please contact the neurology consultation attending.  Signed,  [place]manvillePager Number [phone]

## 2021-03-11 ENCOUNTER — Inpatient Hospital Stay: Payer: Medicare Other

## 2021-03-11 LAB — CBC WITH DIFFERENTIAL/PLATELET
Abs Immature Granulocytes: 0.06 10*3/uL (ref 0.00–0.07)
Basophils Absolute: 0 10*3/uL (ref 0.0–0.1)
Basophils Relative: 0 %
Eosinophils Absolute: 0 10*3/uL (ref 0.0–0.5)
Eosinophils Relative: 0 %
HCT: 38.2 % — ABNORMAL LOW (ref 39.0–52.0)
Hemoglobin: 12.4 g/dL — ABNORMAL LOW (ref 13.0–17.0)
Immature Granulocytes: 1 %
Lymphocytes Relative: 7 %
Lymphs Abs: 0.8 10*3/uL (ref 0.7–4.0)
MCH: 29 pg (ref 26.0–34.0)
MCHC: 32.5 g/dL (ref 30.0–36.0)
MCV: 89.3 fL (ref 80.0–100.0)
Monocytes Absolute: 0.7 10*3/uL (ref 0.1–1.0)
Monocytes Relative: 6 %
Neutro Abs: 9.8 10*3/uL — ABNORMAL HIGH (ref 1.7–7.7)
Neutrophils Relative %: 86 %
Platelets: 374 10*3/uL (ref 150–400)
RBC: 4.28 MIL/uL (ref 4.22–5.81)
RDW: 13.5 % (ref 11.5–15.5)
WBC: 11.5 10*3/uL — ABNORMAL HIGH (ref 4.0–10.5)
nRBC: 0 % (ref 0.0–0.2)

## 2021-03-11 LAB — GLUCOSE, CAPILLARY
Glucose-Capillary: 165 mg/dL — ABNORMAL HIGH (ref 70–99)
Glucose-Capillary: 157 mg/dL — ABNORMAL HIGH (ref 70–99)
Glucose-Capillary: 184 mg/dL — ABNORMAL HIGH (ref 70–99)
Glucose-Capillary: 148 mg/dL — ABNORMAL HIGH (ref 70–99)

## 2021-03-11 MED ORDER — GADOBUTROL 1 MMOL/ML IV SOLN
6.0000 mL | Freq: Once | INTRAVENOUS | Status: AC | PRN
  Administered 2021-03-11: 20:00:00 6 mL via INTRAVENOUS

## 2021-03-11 MED ORDER — LORAZEPAM 2 MG/ML IJ SOLN
0.5000 mg | Freq: Once | INTRAMUSCULAR | Status: AC
  Administered 2021-03-11: 19:00:00 1 mg via INTRAVENOUS
  Filled 2021-03-11: qty 1

## 2021-03-11 NOTE — Progress Notes (Addendum)
PROGRESS NOTE   Kenneth Franklin  QPY:[id number] DOB: 07/02/1946 DOA: 03/06/2021 PCP: Verita Lamb, NP  Brief Narrative:   75 y.o. 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  nasopharyngeal cancer since September 2021 after presenting with a 26-month persistent headache.  chemo and radiation therapy, finished on 10/21/2020--declined further adjuvant therapy.  He had PET scan 02/22/2021 showing interval decrease in the size and FDG uptake of the mass.  [facility] neurology evaluated 03/02/2021--increased gabapentin, added Depakote (?  Giant cell arteritis) headaches DDx perineural spread of his malignancy.   MRI here on 03/06/2021 revealed no clear evidence of progressive disease however.  Admitted to hospitalist service with Neurology consulted.  Oncology is following as well.    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  Giant cell arteritis/temporal arteritis remains in the differential however.  Ultrasound of temporal artery to look for halo sign is planned per neurology.  Patient received IV lidocaine infusion on 4/10 for both diagnostic and therapeutic purposes.   He has had no response to several agents and it was discussed with neurology next steps-we consulted Dr. Zollie Scale of pain management who only performs outpatient sphenopalatine blocks  Patient in the process of being transferred to the service of rhinologist Dr. Ginger Carne  [facility]-Problem based course Intractable pain secondary to nasopharyngeal carcinoma Current meds Tegretol 100 every 8 Klonopin 0.25 3 times daily Depakote 750 PM in addition to gabapentin 600 p.m. and gabapentin 300  daily and Robaxin 500 every 6 as needed No response to intravenous lidocaine infusion, no response to IV fosphenytoin either Continue Dilaudid 1 mg every 4 as needed Appreciate neurology oncology input Nasal pharyngeal carcinoma At the request of [facility], we did discussed with Dr. Deneen Harts of Radiology Based on MRI and PET's and recent MRIs'  Looks like large Ca in Clivus and sphenoid sinus--unclear origin--it looked pretty evident uptake Post Rx decreased uptake There recent MRI on 4/9 shows necrotic tumour from the perspective of the radiologist and not a mucocele as was suspected by [place] rhinologist --there is concern for peri-neural spread, or nerve injury per the radiologist Based on his recommendations I have ordered a skull based MRI in the trigeminal nerve distribution to help support diagnosis of perineural spread and better delineate the trigeminal nerve Temporal arteritis ruled out No halo sign on ultrasound History of stroke Continue aspirin Plavix Lipitor 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 Continue sliding scale coverage--patient not really eating Depression Continue BuSpar 10 at bedtime, Lexapro 20 daily, Remeron 15 at bedtime as needed HTN Continue losartan 100 daily, hydralazine 5 mg every 2 as needed  DVT prophylaxis: Heparin Code Status: DNR confirmed Family Communication: Called Daughter Otila Kluver [phone] to update completely-she expresses frustration and absolutely requests that we do everything possible to help with his pain-I addressed her concerns and have assured her we will do the best that we can for him Disposition:  Status is: Inpatient  Remains inpatient appropriate because:IV treatments appropriate due to intensity of illness or inability to take PO   Dispo: The patient is from: Home              Anticipated  d/c is to: Needs transfer to tertiary care center-this is in the process              Patient currently is not medically stable to d/c.   Difficult to place  patient Yes   Consultants:   Neurology  Oncology  Interventional pain management telephone consulted  Procedures: Multiple  Antimicrobials: None   Subjective:  Intractable pain continues Patient is coherent at times but when he is given opiates he becomes sleepy I saw him twice today he had some ataxia earlier with therapy services probably because he was so sleepy When I saw him in the afternoon he was quite agitated and was asking for pain control See above Long discussions   Objective: Vitals:   03/11/21 0743 03/11/21 0749 03/11/21 1118 03/11/21 1544  BP:  (!) 145/78 122/63 (!) 164/68  Pulse: 75 72 72 71  Resp: 16  19 18   Temp: 98.8 F (37.1 C)  98.2 F (36.8 C) 97.9 F (36.6 C)  TempSrc: Oral  Oral Oral  SpO2: 98%  97% 97%  Weight:      Height:        Intake/Output Summary (Last 24 hours) at 03/11/2021 1811 Last data filed at 03/11/2021 1431 Gross per 24 hour  Intake 0 ml  Output 550 ml  Net -550 ml   Filed Weights   03/06/21 0321  Weight: 61.2 kg    Examination:  Asthenic white male looks older than stated age Supraclavicular bitemporal wasting Poor dentition S1-S2 slightly tachycardic Abdomen scaphoid no rebound no guarding Chest clear posterolaterally   Data Reviewed: personally reviewed   CBC    Component Value Date/Time   WBC 11.5 (H) 03/11/2021 1128   RBC 4.28 03/11/2021 1128   HGB 12.4 (L) 03/11/2021 1128   HCT 38.2 (L) 03/11/2021 1128   PLT 374 03/11/2021 1128   MCV 89.3 03/11/2021 1128   MCH 29.0 03/11/2021 1128   MCHC 32.5 03/11/2021 1128   RDW 13.5 03/11/2021 1128   LYMPHSABS 0.8 03/11/2021 1128   MONOABS 0.7 03/11/2021 1128   EOSABS 0.0 03/11/2021 1128   BASOSABS 0.0 03/11/2021 1128   CMP Latest Ref Rng & Units 03/10/2021 03/07/2021 03/06/2021  Glucose 70 - 99 mg/dL 127(H) 145(H) 251(H)  BUN 8 - 23 mg/dL 18 24(H) 26(H)  Creatinine 0.61 - 1.24 mg/dL 0.72 0.76 0.91  Sodium 135 - 145 mmol/L 136 135 133(L)  Potassium 3.5 -  5.1 mmol/L 3.9 3.9 4.9  Chloride 98 - 111 mmol/L 94(L) 97(L) 96(L)  CO2 22 - 32 mmol/L 29 30 28   Calcium 8.9 - 10.3 mg/dL 9.7 9.7 9.8  Total Protein 6.5 - 8.1 g/dL - - -  Total Bilirubin 0.3 - 1.2 mg/dL - - -  Alkaline Phos 38 - 126 U/L - - -  AST 15 - 41 U/L - - -  ALT 0 - 44 U/L - - -     Radiology Studies: No results found.   Scheduled Meds: . aspirin EC  81 mg Oral Daily  . busPIRone  10 mg Oral QHS  . carbamazepine  100 mg Oral QID  . clonazepam  0.25 mg Oral TID  . clopidogrel  75 mg Oral Daily  . divalproex  250 mg Oral QHS  . [START ON 03/13/2021] divalproex  500 mg Oral QHS  . [START ON 03/20/2021] divalproex  750 mg Oral QHS  . escitalopram  20 mg Oral Daily  . famotidine  40 mg Oral QPM  .  feeding supplement  237 mL Oral TID BM  . [START ON 03/13/2021] gabapentin  300 mg Oral Q0600  . gabapentin  600 mg Oral QHS  . heparin  5,000 Units Subcutaneous Q8H  . insulin aspart  0-5 Units Subcutaneous QHS  . insulin aspart  0-9 Units Subcutaneous TID WC  . LORazepam  0.5-1 mg Intravenous Once  . losartan  100 mg Oral Daily  . multivitamin with minerals  1 tablet Oral Daily  . polyethylene glycol  17 g Oral Daily   Continuous Infusions: . promethazine (PHENERGAN) injection (IM or IVPB) Stopped (03/08/21 0820)     LOS: 4 days   Time spent: 85 minutes including care coordination time and collusion with Dr. Mike Gip of oncology, Dr. Darlin Drop of neurology and several calls to [facility] for patient transfer  [name], MD [facility] To contact the attending provider between 7A-7P or the covering provider during after hours 7P-7A, please log into the web site [email or web address] and access using universal  password for that web site. If you do not have the password, please call the hospital operator.  03/11/2021, 6:11 PM

## 2021-03-11 NOTE — Progress Notes (Addendum)
A call was placed to [facility] yesterday and I received a call after 7 PM  It appears that [facility] Dr. Lynett Grimes is on service and he will liase this morning with rhinologist Wynn Banker [facility] for consideration of transfer to [facility] for intractable pain   No charge  Verneita Griffes, MD [facility] 7:29 AM

## 2021-03-11 NOTE — Plan of Care (Signed)
End of Shift Summary:  Alert and disoriented to time and place, reoriented when needed. VSS. Pt in uncontrolled pain, only "relieved" when sleeping after administration of PRN medications, wakes up with pain 10/10 and impulsively attempts to get out of bed with eyes closed. Stimuli decreased. Requiring meds to be crushed in applesauce and still states "I can't do it." Noticeably more difficulty with thin liquids. Weak productive cough, brown sputum when pt actually expectorates. Denies n/v. Urine output adequate via urinal. Bed alarm on, fall mat in place, frequent contacts made to assess needs. Remained free from falls or injury this shift. Bed low and in locked position.  Problem: Education: Goal: Knowledge of General Education information will improve Description: Including pain rating scale, medication(s)/side effects and non-pharmacologic comfort measures Outcome: Progressing   Problem: Pain Managment: Goal: General experience of comfort will improve Outcome: Progressing   Problem: Nutrition: Goal: Adequate nutrition will be maintained Outcome: Progressing   Problem: Clinical Measurements: Goal: Ability to maintain clinical measurements within normal limits will improve Outcome: Progressing   Problem: Safety: Goal: Ability to remain free from injury will improve Outcome: Progressing

## 2021-03-11 NOTE — Consult Note (Signed)
Kenneth Franklin, [place] [id number] May 07, 1946 Kenneth Sells, MD  Reason for Consult: intractable facial pain, headache, history of nasopharyngeal carcinoma s/p chemo/xrt  HPI: 75 y.o. 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  Treated with XRT and 5 weeks of cisplatin completed 4 months ago.  Initially determined to be unresectable.  MRI at that time revealed a mostly T2 intensity mass with destruction of skull base and clivus.  Patient has had progressively worsening right facial pain and headache necessitating multiple ER visits over last month.  He was last evaluated by [name] on 4/7 and endoscopy performed at that time by Dr. Kathyrn Sheriff.  He was recommended to see Neurology given significant headaches and facial pain.  Since that time, he was admitted for pain control and has developed AMS and intractable pain.  Asked to evaluate if any further guidance from ENT perspective given intractable pain.  Allergies:  Allergies  Allergen Reactions  . Propoxyphene     Unknown reaction    ROS: Review of systems normal other than 12 systems except per HPI.  PMH:  Past Medical History:  Diagnosis Date  . Acute ischemic stroke ([place]) 2017  . Alcohol abuse   . Anxiety   . Cancer ([place])   . Depression   . Diabetes mellitus without complication ([place])   . Dyspnea    pcp knows and ordered rescue inhaler  . Hyperlipidemia   . Hypertension   . Skin cancer    Squamous Cell Carcinoma In Situ    FH:  Family History  Problem Relation Age of Onset  . Diabetes Brother     SH:  Social History   Socioeconomic History  . Marital status: Widowed    Spouse name: Not on file  . Number of children: Not on file  . Years of education: Not on file  . Highest education level: Not on file  Occupational History  . Not on file  Tobacco Use  . Smoking status: Former Smoker    Packs/day: 1.00    Years: 40.00    Pack years: 40.00     Types: Cigars, Cigarettes    Quit date: 08/12/2019    Years since quitting: 1.5  . Smokeless tobacco: Never Used  Vaping Use  . Vaping Use: Never used  Substance and Sexual Activity  . Alcohol use: Not Currently    Comment: H/O ETOH ABUSE BUT DENIES DRINKING DURING 08-11-20 INTERVIEW  . Drug use: No  . Sexual activity: Never  Other Topics Concern  . Not on file  Social History Narrative  . Not on file   Social Determinants of Health   Financial Resource Strain: Not on file  Food Insecurity: Not on file  Transportation Needs: Not on file  Physical Activity: Not on file  Stress: Not on file  Social Connections: Not on file  Intimate Partner Violence: Not on file    PSH:  Past Surgical History:  Procedure Laterality Date  . CATARACT EXTRACTION Bilateral   . COLONOSCOPY    . NASOPHARYNGOSCOPY N/A 08/12/2020   Procedure: ENDOSCOPIC NASOPHARYNGOSCOPY WITH BIOPSY;  Surgeon: Margaretha Sheffield, MD;  Location: [facility] ORS;  Service: ENT;  Laterality: N/A;  . PORTA CATH INSERTION N/A 08/24/2020   Procedure: PORTA CATH INSERTION;  Surgeon: Algernon Huxley, MD;  Location: [place] [facility];  Service: Cardiovascular;  Laterality: N/A;    Physical  Exam: GEN-  Patient sleeping and sedated  MRI 03/06/21-  Large nasopharyngeal  mass with increased central hypointensity consistent with necrosis presumably secondary to treatment.   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  Previously was determined to be unresectable and decrease in size and FDG uptake but continues to have uptake of 8.32.  Patient per nursing is  having generalized full body pain with inability to tolerate even light touch on extremities at times.  Suspect Neurological issue and unclear of impact on residual tumor on current symptoms given at least partial response to treatment.  If patient continues to have intractable pain and further treatment is needed, recommend transfer to tertiary care center given complex presentation and where skull base/NSG is available if further treatment is considered.   [name] 03/11/2021 12:19 PM

## 2021-03-11 NOTE — Progress Notes (Signed)
Nutrition Follow-up  DOCUMENTATION CODES:   Severe malnutrition in context of chronic illness,Underweight  INTERVENTION:   -Continue Ensure Enlive po TID, each supplement provides 350 kcal and 20 grams of protein -MVI with minerals daily -Continue Magic cup TID with meals, each supplement provides 290 kcal and 9 grams of protein -Continue Hormel Shake TID with meals, each supplement provides 520 kcals and 22 grams protein -Continue liberalized diet of regular -Pt may benefit from enteral nutrition support if this is within his goals of care  NUTRITION DIAGNOSIS:   Severe Malnutrition related to chronic illness (nasopharyngeal carcinoma) as evidenced by moderate fat depletion,severe fat depletion,moderate muscle depletion,severe muscle depletion,percent weight loss.  Ongoing  GOAL:   Patient will meet greater than or equal to 90% of their needs  Unmet  MONITOR:   PO intake,Supplement acceptance,Labs,Weight trends,Skin,I & O's  REASON FOR ASSESSMENT:   Consult Assessment of nutrition requirement/status  ASSESSMENT:   Kenneth Franklin is a 75 y.o. 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  Reviewed I/O's: -550 ml x 24 hours and -1.4 L since admission  UOP: 550 ml x 24 hours  Pt unavailable at time of visit. Attempted to speak with pt via call to hospital room phone, however, unable to reach.   Pt remains lethargic and with poor oral intake. Noted meal completions 0-25%. Pt is consuming 1-2 Ensure supplements a day per MAR.   Palliative care following for goals of care discussions.   Per MD notes, pt awaiting transfer to [facility] for further inpatient pain management.   Medications reviewed and include miralax.   Labs reviewed: CBGS: 124-165 (inpatient orders for glycemic control  are 0-5 units insulin aspart TID with meals).   Diet Order:   Diet Order            Diet regular Room service appropriate? Yes with Assist; Fluid consistency: Thin  Diet effective now                 EDUCATION NEEDS:   No education needs have been identified at this time  Skin:  Skin Assessment: Reviewed RN Assessment  Last BM:  03/07/21  Height:   Ht Readings from Last 1 Encounters:  03/06/21 6' (1.829 m)    Weight:   Wt Readings from Last 1 Encounters:  03/06/21 61.2 kg    Ideal Body Weight:  80.9 kg  BMI:  Body mass index is 18.31 kg/m.  Estimated Nutritional Needs:   Kcal:  1950-2150  Protein:  110-125 grams  Fluid:  > 1.9 L    Loistine Chance, RD, LDN, [place] Registered Dietitian II Certified Diabetes Care and Education Specialist Please refer to [facility] for RD and/or RD on-call/weekend/after hours pager

## 2021-03-11 NOTE — Care Management Important Message (Signed)
Important Message  Patient Details  Name: ELIZANDRO LAURA MRN: [id number] Date of Birth: March 21, 1946   Medicare Important Message Given:  Other (see comment)  Patient is confused so I was unable to review the Important Message from Medicare with him.  Will try again tomorrow.   Juliann Pulse A Starlette Thurow 03/11/2021, 2:19 PM

## 2021-03-11 NOTE — Progress Notes (Signed)
Physical Therapy Treatment Patient Details Name: Kenneth Franklin MRN: [id number] DOB: 1946-04-03 Today's Date: 03/11/2021    History of Present Illness Pt is a 75 y.o. 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  MD assessment includes: intractable headache, brain atrophy and extensive chronic microvascular ischemia, no acute infarct and negative for metastatic disease to the brain.    PT Comments    Pt was lethargic during the session but alertness improved as session progressed.  Pt demonstrated grossly improved sitting balance with decreased R lateral lean compared to prior session and was able to take several steps at the EOB with mod A for stability.  Pt reported HA of 10/10 with nsg notified and pain medication provided but with pt presenting with no apparent distress during the session.  Pt's SpO2 and HR were both WNL on room air.  Pt remains at a very high risk for falls and will benefit from PT services in a SNF setting upon discharge to safely address deficits listed in patient problem list for decreased caregiver assistance and eventual return to PLOF.       Follow Up Recommendations  SNF     Equipment Recommendations  Other (comment) (TBD at next venue of care)    Recommendations for Other Services       Precautions / Restrictions Precautions Precautions: Fall Restrictions Weight Bearing Restrictions: No    Mobility  Bed Mobility Overal bed mobility: Needs Assistance Bed Mobility: Supine to Sit;Sit to Supine     Supine to sit: Min assist Sit to supine: Min assist   General bed mobility comments: Min A for BLE control in and out of bed    Transfers Overall transfer level: Needs assistance Equipment used: Rolling walker (2 wheeled) Transfers: Sit to/from Stand Sit to Stand:  Mod assist         General transfer comment: Mod A to come to standing and for general stability  Ambulation/Gait Ambulation/Gait assistance: Mod assist Gait Distance (Feet): 2 Feet Assistive device: Rolling walker (2 wheeled) Gait Pattern/deviations: Step-to pattern;Trunk flexed;Decreased step length - right;Decreased step length - left Gait velocity: decreased   General Gait Details: Mod A for stability   Stairs             Wheelchair Mobility    Modified Rankin (Stroke Patients Only)       Balance Overall balance assessment: Needs assistance Sitting-balance support: No upper extremity supported;Feet supported Sitting balance-Leahy Scale: Poor Sitting balance - Comments: R lateral lean in sitting but grossly improved from prior session with pt only needing occasional min A for stability Postural control: Right lateral lean Standing balance support: Bilateral upper extremity supported;During functional activity Standing balance-Leahy Scale: Poor Standing balance comment: constant Mod A to prevent LOB                            Cognition Arousal/Alertness: Lethargic Behavior During Therapy: WFL for tasks assessed/performed Overall Cognitive Status: Within Functional Limits for tasks assessed                                        Exercises Total Joint Exercises Ankle Circles/Pumps: AROM;Both;10 reps Quad Sets: Strengthening;Both;10 reps Heel Slides: AROM;AAROM;Strengthening;Both;10 reps Hip ABduction/ADduction: Strengthening;Both;10  reps;AROM;AAROM Straight Leg Raises: AROM;AAROM;Strengthening;Both;10 reps Long Arc Quad: AROM;Strengthening;Both;10 reps  L lateral weight shifting in sitting to address R lateral lean Static sitting at EOB for core therex and improved activity tolerance     General Comments        Pertinent Vitals/Pain Pain Assessment: 0-10 Pain Score: 10-Worst pain ever Pain Location: HA Pain Descriptors /  Indicators: Headache Pain Intervention(s): Monitored during session;Patient requesting pain meds-RN notified;RN gave pain meds during session    Home Living                      Prior Function            PT Goals (current goals can now be found in the care plan section) Progress towards PT goals: Progressing toward goals    Frequency    Min 2X/week      PT Plan Current plan remains appropriate    Co-evaluation              AM-PAC PT "6 Clicks" Mobility   Outcome Measure  Help needed turning from your back to your side while in a flat bed without using bedrails?: A Little Help needed moving from lying on your back to sitting on the side of a flat bed without using bedrails?: A Lot Help needed moving to and from a bed to a chair (including a wheelchair)?: A Lot Help needed standing up from a chair using your arms (e.g., wheelchair or bedside chair)?: A Lot Help needed to walk in hospital room?: Total Help needed climbing 3-5 steps with a railing? : Total 6 Click Score: 11    End of Session Equipment Utilized During Treatment: Gait belt Activity Tolerance: Patient tolerated treatment well Patient left: in bed;with call bell/phone within reach;with bed alarm set Nurse Communication: Mobility status PT Visit Diagnosis: Unsteadiness on feet (R26.81);Difficulty in walking, not elsewhere classified (R26.2);Muscle weakness (generalized) (M62.81)     Time: 1103-1594 PT Time Calculation (min) (ACUTE ONLY): 25 min  Charges:  $Therapeutic Exercise: 8-22 mins $Therapeutic Activity: 8-22 mins                     D. Scott Jordynn Perrier PT, DPT 03/11/21, 1:02 PM

## 2021-03-12 DIAGNOSIS — R519 Headache, unspecified: Secondary | ICD-10-CM | POA: Diagnosis not present

## 2021-03-12 DIAGNOSIS — Z515 Encounter for palliative care: Secondary | ICD-10-CM | POA: Diagnosis not present

## 2021-03-12 LAB — CBC WITH DIFFERENTIAL/PLATELET
Abs Immature Granulocytes: 0.11 10*3/uL — ABNORMAL HIGH (ref 0.00–0.07)
Basophils Absolute: 0 10*3/uL (ref 0.0–0.1)
Basophils Relative: 0 %
Eosinophils Absolute: 0 10*3/uL (ref 0.0–0.5)
Eosinophils Relative: 0 %
HCT: 46.3 % (ref 39.0–52.0)
Hemoglobin: 14.4 g/dL (ref 13.0–17.0)
Immature Granulocytes: 1 %
Lymphocytes Relative: 3 %
Lymphs Abs: 0.6 10*3/uL — ABNORMAL LOW (ref 0.7–4.0)
MCH: 29.4 pg (ref 26.0–34.0)
MCHC: 31.1 g/dL (ref 30.0–36.0)
MCV: 94.5 fL (ref 80.0–100.0)
Monocytes Absolute: 0.9 10*3/uL (ref 0.1–1.0)
Monocytes Relative: 5 %
Neutro Abs: 17.2 10*3/uL — ABNORMAL HIGH (ref 1.7–7.7)
Neutrophils Relative %: 91 %
Platelets: 420 10*3/uL — ABNORMAL HIGH (ref 150–400)
RBC: 4.9 MIL/uL (ref 4.22–5.81)
RDW: 13.7 % (ref 11.5–15.5)
WBC: 18.8 10*3/uL — ABNORMAL HIGH (ref 4.0–10.5)
nRBC: 0 % (ref 0.0–0.2)

## 2021-03-12 LAB — HEMOGLOBIN AND HEMATOCRIT, BLOOD
HCT: 38.8 % — ABNORMAL LOW (ref 39.0–52.0)
HCT: 37.2 % — ABNORMAL LOW (ref 39.0–52.0)
HCT: 38.8 % — ABNORMAL LOW (ref 39.0–52.0)
Hemoglobin: 12.5 g/dL — ABNORMAL LOW (ref 13.0–17.0)
Hemoglobin: 12.1 g/dL — ABNORMAL LOW (ref 13.0–17.0)
Hemoglobin: 12.5 g/dL — ABNORMAL LOW (ref 13.0–17.0)

## 2021-03-12 LAB — COMPREHENSIVE METABOLIC PANEL
ALT: 26 U/L (ref 0–44)
AST: 24 U/L (ref 15–41)
Albumin: 3.7 g/dL (ref 3.5–5.0)
Alkaline Phosphatase: 81 U/L (ref 38–126)
Anion gap: 15 (ref 5–15)
BUN: 16 mg/dL (ref 8–23)
CO2: 24 mmol/L (ref 22–32)
Calcium: 9.2 mg/dL (ref 8.9–10.3)
Chloride: 95 mmol/L — ABNORMAL LOW (ref 98–111)
Creatinine, Ser: 0.64 mg/dL (ref 0.61–1.24)
GFR, Estimated: >60 mL/min (ref >60)
Glucose, Bld: 159 mg/dL — ABNORMAL HIGH (ref 70–99)
Potassium: 4.2 mmol/L (ref 3.5–5.1)
Sodium: 134 mmol/L — ABNORMAL LOW (ref 135–145)
Total Bilirubin: 1.1 mg/dL (ref 0.3–1.2)
Total Protein: 7.9 g/dL (ref 6.5–8.1)

## 2021-03-12 LAB — LACTIC ACID, PLASMA
Lactic Acid, Venous: 1.7 mmol/L (ref 0.5–1.9)
Lactic Acid, Venous: 1.9 mmol/L (ref 0.5–1.9)

## 2021-03-12 LAB — GLUCOSE, CAPILLARY
Glucose-Capillary: 202 mg/dL — ABNORMAL HIGH (ref 70–99)
Glucose-Capillary: 168 mg/dL — ABNORMAL HIGH (ref 70–99)
Glucose-Capillary: 142 mg/dL — ABNORMAL HIGH (ref 70–99)

## 2021-03-12 MED ORDER — VANCOMYCIN HCL 1500 MG/300ML IV SOLN
1500.0000 mg | Freq: Once | INTRAVENOUS | Status: AC
  Administered 2021-03-12: 18:00:00 1500 mg via INTRAVENOUS
  Filled 2021-03-12: qty 300

## 2021-03-12 MED ORDER — VANCOMYCIN HCL 750 MG IV SOLR
750.0000 mg | Freq: Two times a day (BID) | INTRAVENOUS | Status: DC
  Administered 2021-03-13 – 2021-03-14 (×3): 750 mg via INTRAVENOUS
  Filled 2021-03-12 (×6): qty 750

## 2021-03-12 MED ORDER — GUAIFENESIN ER 600 MG PO TB12
600.0000 mg | ORAL_TABLET | Freq: Two times a day (BID) | ORAL | Status: DC
  Administered 2021-03-12 – 2021-03-13 (×3): 600 mg via ORAL
  Filled 2021-03-12 (×3): qty 1

## 2021-03-12 MED ORDER — HYDROMORPHONE HCL 1 MG/ML IJ SOLN
1.0000 mg | INTRAMUSCULAR | Status: DC | PRN
  Administered 2021-03-12 – 2021-03-14 (×8): 1 mg via INTRAVENOUS
  Filled 2021-03-12 (×8): qty 1

## 2021-03-12 MED ORDER — SODIUM CHLORIDE 0.9 % IV SOLN: INTRAVENOUS | Status: DC

## 2021-03-12 MED ORDER — PIPERACILLIN-TAZOBACTAM 3.375 G IVPB
3.3750 g | Freq: Three times a day (TID) | INTRAVENOUS | Status: DC
  Administered 2021-03-12 – 2021-03-14 (×6): 3.375 g via INTRAVENOUS
  Filled 2021-03-12 (×10): qty 50

## 2021-03-12 NOTE — Progress Notes (Signed)
PROGRESS NOTE   Kenneth Franklin  ASN:[id number] DOB: [birth year] DOA: 03/06/2021 PCP: Verita Lamb, NP  Brief Narrative:   75 y.o. male with stage III nasopharyngeal carcinoma s/p chemotherapy and radiation who presented to the ED on 03/06/21 + right-sided headache X 2 weeks He had previously sought treatment in ED at [facility] and here at [facility] during this time, daughter reported 9 ED visits in the prior 10 days.  nasopharyngeal cancer since September 2021 after presenting with a 19-month persistent headache.  chemo and radiation therapy, finished on 10/21/2020--declined further adjuvant therapy.  He had PET scan 02/22/2021 showing interval decrease in the size and FDG uptake of the mass.  [facility] neurology evaluated 03/02/2021--increased gabapentin, added Depakote (?  Giant cell arteritis) headaches DDx perineural spread of his malignancy.   MRI here on 03/06/2021 revealed no clear evidence of progressive disease however.  Admitted to hospitalist service with Neurology consulted.  Oncology is following as well.    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  Giant cell arteritis/temporal arteritis remains in the differential however.  Ultrasound of temporal artery to look for halo sign is planned per neurology.  Patient received IV lidocaine infusion on 4/10 for both diagnostic and therapeutic purposes.   He has had no response to several agents and it was discussed with neurology next steps-we consulted Dr. Zollie Scale of pain management who only performs outpatient sphenopalatine blocks  Patient in the process of being transferred to [facility] At this time ENT Dr.Abi Hachem has declined to take him on his service  Hospital-Problem based course Intractable pain secondary to nasopharyngeal carcinoma Continuing Tegretol Klonopin  Depakote gabapentin and Robaxin  No response to intravenous lidocaine infusion,  no response to IV fosphenytoin either Continue Dilaudid 1 mg every 4 as needed for severe pain, have increased it to every 3 as needed Appreciate neurology oncology input ?  Nasopharyngeal bleeding Leukocytosis with low-grade temperatures Greatly appreciate input from Dr. Sheppard Coil and ENT who performed flexible laryngoscopy 4/15 without source of bleed per his note Stopping aspirin Plavix at this time-would not resume for now If patient spikes fever >100.5 we will panculture him and start empiric antibiotics-DDx necrotic nasopharyngeal CA? Nasal pharyngeal carcinoma At the request of [facility], we did discussed with Dr. Deneen Harts of Radiology Based on MRI and PET's and recent MRIs'  Looks like large Ca in Clivus and sphenoid sinus--unclear origin--it looked pretty evident uptake Post Rx decreased uptake There recent MRI on 4/9 shows necrotic tumour from the perspective of the radiologist and not a mucocele as was suspected by [facility] rhinologist --there is concern for peri-neural spread, or nerve injury per the radiologist I have discussed the patient's case with oncology Dr. Mike Gip who also feels transfer is extremely reasonable I will discuss the case with Dr. 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 Continue sliding scale coverage--patient not really eating Depression Continue BuSpar 10 at bedtime, Lexapro 20 daily, Remeron 15 at bedtime as needed HTN Continue losartan 100 daily, hydralazine 5 mg every 2 as needed  DVT prophylaxis: Heparin Code Status: DNR  confirmed Family Communication: Called Daughter Otila Kluver [phone] on 4/14 Disposition:   Status is: Inpatient I received a call from the [place] transfer center on 4/15 informing me that ENT has since reviewed the images wit radiology, has since felt that the patient does not have a mucocele as was previously thought and has since declined patient transfer I have informed [facility] transfer line to kindly assist [place] to transfer over so that patient can get the appropriate pain management and definitive care over there that we can offer at our small hospital  Remains inpatient appropriate because:IV treatments appropriate due to intensity of illness or inability to take PO   Dispo: The patient is from: Home              Anticipated d/c is to: Needs transfer to tertiary care center-this is in the process              Patient currently is not medically stable to d/c.   Difficult to place patient Yes   Consultants:   Neurology  Oncology  Interventional pain management telephone consulted  Procedures: Multiple  Antimicrobials: None   Subjective:  Events overnight noted Rounded with nurse this morning but could not identify any bleeding ENT consulted He is more alert than he has been over the past several days    Objective: Vitals:   03/12/21 0019 03/12/21 0839 03/12/21 1039 03/12/21 1145  BP: (!) 155/69 (!) 143/86 130/60 (!) 130/59  Pulse: 76 (!) 121 95 92  Resp: 14 18 16 16   Temp: 98 F (36.7 C) (!) 100.4 F (38 C) 98.9 F (37.2 C) 98.9 F (37.2 C)  TempSrc: Oral Oral    SpO2: 99% 99% 93% 92%  Weight:      Height:        Intake/Output Summary (Last 24 hours) at 03/12/2021 1353 Last data filed at 03/11/2021 1921 Gross per 24 hour  Intake 0 ml  Output --  Net 0 ml   Filed Weights   03/06/21 0321  Weight: 61.2 kg    Examination:  Asthenic white male looks older than stated age Blood-tinged oropharynx with blood over his gown-I tried to visualize into his mouth several times and was not able to find the source of bleeding but it appears to have  stopped Supraclavicular bitemporal wasting Poor dentition S1-S2  tachycardic  Data Reviewed: personally reviewed   CBC    Component Value Date/Time   WBC 18.8 (H) 03/12/2021 0507   RBC 4.90 03/12/2021 0507   HGB 14.4 03/12/2021 0507   HCT 46.3 03/12/2021 0507   PLT 420 (H) 03/12/2021 0507   MCV 94.5 03/12/2021 0507   MCH 29.4 03/12/2021 0507   MCHC 31.1 03/12/2021 0507   RDW 13.7 03/12/2021 0507   LYMPHSABS 0.6 (L) 03/12/2021 0507   MONOABS 0.9 03/12/2021 0507   EOSABS 0.0 03/12/2021 0507   BASOSABS 0.0 03/12/2021 0507   CMP Latest Ref Rng & Units 03/12/2021 03/10/2021 03/07/2021  Glucose 70 - 99 mg/dL 159(H) 127(H) 145(H)  BUN 8 - 23 mg/dL 16 18 24(H)  Creatinine 0.61 - 1.24 mg/dL 0.64 0.72 0.76  Sodium 135 - 145 mmol/L 134(L) 136 135  Potassium 3.5 - 5.1 mmol/L 4.2 3.9 3.9  Chloride 98 - 111 mmol/L 95(L) 94(L) 97(L)  CO2 22 - 32 mmol/L 24 29 30   Calcium 8.9 - 10.3 mg/dL 9.2 9.7 9.7  Total Protein 6.5 - 8.1 g/dL 7.9 - -  Total Bilirubin 0.3 - 1.2 mg/dL 1.1 - -  Alkaline Phos 38 - 126 U/L 81 - -  AST 15 - 41 U/L 24 - -  ALT 0 - 44 U/L 26 - -     Radiology Studies: MR MRV HEAD W WO CONTRAST  Result Date: 03/11/2021 CLINICAL DATA:  Worsening headache EXAM: MR VENOGRAM HEAD WITHOUT AND WITH CONTRAST TECHNIQUE: Angiographic images of the intracranial venous structures were obtained using MRV technique without and with intravenous contrast. CONTRAST:  27mL GADAVIST GADOBUTROL 1 MMOL/ML IV SOLN COMPARISON:  03/06/2021 brain MRI FINDINGS: Superior sagittal sinus: Normal. Straight sinus: Normal. Inferior sagittal sinus, vein of Galen and internal cerebral veins: Normal. Transverse sinuses: Normal. Sigmoid sinuses: Normal. Visualized jugular veins: Normal. Other: Redemonstration of nasopharyngeal mass, as described on 03/06/2021. IMPRESSION: 1. No intracranial venous thrombosis. 2. Unchanged appearance of treated nasopharyngeal mass, since 03/06/2021. Electronically Signed   By: Ulyses Jarred M.D.   On: 03/11/2021 19:53     Scheduled Meds: . busPIRone  10 mg Oral QHS  . carbamazepine  100 mg Oral QID  . clonazepam  0.25 mg Oral TID  . divalproex  250 mg Oral QHS  . [START ON 03/13/2021] divalproex  500 mg Oral QHS  . [START ON 03/20/2021] divalproex  750 mg Oral QHS  . escitalopram  20 mg Oral Daily  . famotidine  40 mg Oral QPM  . feeding supplement  237 mL Oral TID BM  . [START ON 03/13/2021] gabapentin  300 mg Oral Q0600  . gabapentin  600 mg Oral QHS  . guaiFENesin  600 mg Oral BID  . heparin  5,000 Units Subcutaneous Q8H  . insulin aspart  0-5 Units Subcutaneous QHS  . insulin aspart  0-9 Units Subcutaneous TID WC  . losartan  100 mg Oral Daily  . multivitamin with minerals  1 tablet Oral Daily  . polyethylene glycol  17 g Oral Daily   Continuous Infusions: . promethazine (PHENERGAN) injection (IM or IVPB) Stopped (03/08/21 0820)     LOS: 5 days   Time spent: 77 minutes care coordination time speaking with [facility] transfer line coordinating care with [facility] neurosurgery and discussion with multiple consultants including ENT and neurosurgeon  [name], MD [facility] To contact the attending provider between 7A-7P or the covering provider during after hours 7P-7A, please log into the web site [email or web address] and access using universal [place] password for that web site. If you do not have the password, please call the hospital operator.  03/12/2021, 1:53 PM

## 2021-03-12 NOTE — Progress Notes (Signed)
[place]  Telephone:([phone] [phone]   Name: Kenneth Franklin Date: 03/12/2021 MRN: [id number]  DOB: [birth year]  Patient Care Team: Verita Lamb, NP as PCP - General Lequita Asal, MD as Referring Physician (Hematology and Oncology) Margaretha Sheffield, MD (Otolaryngology)    REASON FOR CONSULTATION: Kenneth Franklin is a 75 y.o. 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  Patient was previously seen in the ED multiple times for same complaint without improvement in symptoms.  PET scan on 02/14/2021 revealed interval decrease in size and FDG uptake of his nasopharyngeal mass.  Patient is actively followed by neurology for presumed trigeminal neuralgia.  He has received IV lidocaine and fosphenytoin by neurology.  Palliative care was consulted up address goals..    CODE STATUS: DNR  PAST MEDICAL HISTORY: Past Medical History:  Diagnosis Date  . Acute ischemic stroke ([place]) 2017  . Alcohol abuse   . Anxiety   . Cancer ([place])   . Depression   . Diabetes mellitus without complication ([place])   . Dyspnea    pcp knows and ordered rescue inhaler  . Hyperlipidemia   . Hypertension   . Skin cancer    Squamous Cell Carcinoma In Situ    PAST SURGICAL HISTORY:  Past Surgical History:  Procedure Laterality Date  . CATARACT EXTRACTION Bilateral   . COLONOSCOPY    . NASOPHARYNGOSCOPY N/A 08/12/2020   Procedure: ENDOSCOPIC NASOPHARYNGOSCOPY WITH BIOPSY;  Surgeon: Margaretha Sheffield, MD;  Location: [facility] ORS;  Service: ENT;  Laterality: N/A;  . PORTA CATH INSERTION N/A 08/24/2020   Procedure: PORTA CATH INSERTION;  Surgeon: Algernon Huxley, MD;  Location: [facility];  Service: Cardiovascular;  Laterality: N/A;    HEMATOLOGY/ONCOLOGY HISTORY:  Oncology History  Nasopharyngeal  carcinoma ([place])  08/18/2020 Initial Diagnosis   Nasopharyngeal carcinoma ([place])   08/18/2020 Cancer Staging   Staging form: Pharynx - Nasopharynx, AJCC 8th Edition - Clinical stage from 08/18/2020: Stage III (cT3, cN0, cM0) - Signed by Lequita Asal, MD on 02/17/2021 Histopathologic type: Squamous cell carcinoma, keratinizing, NOS Stage prefix: Initial diagnosis ECOG performance status: Grade 1 Stage used in treatment planning: Yes National guidelines used in treatment planning: Yes Type of national guideline used in treatment planning: NCCN   08/24/2020 - 10/20/2020 Chemotherapy         12/31/2020 -  Chemotherapy    Patient is on Treatment Plan: HEAD/NECK NASOPHARYNGEAL ADJUVANT CISPLATIN D1 + 5FU IVCI D1-5 Q28D        ALLERGIES:  is allergic to propoxyphene.  MEDICATIONS:  Current Facility-Administered Medications  Medication Dose Route Frequency Provider Last Rate Last Admin  . acetaminophen (TYLENOL) tablet 650 mg  650 mg Oral Q6H PRN Ivor Costa, MD   650 mg at 03/11/21 1450  . albuterol (VENTOLIN HFA) 108 (90 Base) MCG/ACT inhaler 2 puff  2 puff Inhalation Q4H PRN Ivor Costa, MD      . alum & mag hydroxide-simeth (MAALOX/MYLANTA) 200-200-20 MG/5ML suspension 30 mL  30 mL Oral Q6H PRN Ivor Costa, MD   30 mL at 03/06/21 1917  . benzocaine (ORAJEL) 10 % mucosal gel   Mouth/Throat QID PRN Nicole Kindred A, DO      . busPIRone (BUSPAR) tablet 10 mg  10 mg Oral QHS Ivor Costa, MD   10 mg at 03/11/21 2102  . carbamazepine (TEGRETOL)  chewable tablet 100 mg  100 mg Oral QID Donnetta Simpers, MD   100 mg at 03/11/21 2102  . clonazePAM (KLONOPIN) disintegrating tablet 0.25 mg  0.25 mg Oral TID Nicole Kindred A, DO   0.25 mg at 03/11/21 2101  . dextromethorphan-guaiFENesin (MUCINEX DM) 30-600 MG per 12 hr tablet 1 tablet  1 tablet Oral BID PRN Ivor Costa, MD   1 tablet at 03/11/21 2103  . divalproex (DEPAKOTE ER) 24 hr tablet 250 mg  250 mg Oral QHS Ivor Costa, MD   250 mg at  03/11/21 2103  . [START ON 03/13/2021] divalproex (DEPAKOTE ER) 24 hr tablet 500 mg  500 mg Oral QHS Ivor Costa, MD      . Derrill Memo ON 03/20/2021] divalproex (DEPAKOTE ER) 24 hr tablet 750 mg  750 mg Oral QHS Ivor Costa, MD      . escitalopram (LEXAPRO) tablet 20 mg  20 mg Oral Daily Ivor Costa, MD   20 mg at 03/11/21 7564  . famotidine (PEPCID) tablet 40 mg  40 mg Oral QPM Ivor Costa, MD   40 mg at 03/11/21 1714  . feeding supplement (ENSURE ENLIVE / ENSURE PLUS) liquid 237 mL  237 mL Oral TID BM Ivor Costa, MD   237 mL at 03/11/21 0926  . [START ON 03/13/2021] gabapentin (NEURONTIN) capsule 300 mg  300 mg Oral Q0600 Ivor Costa, MD      . gabapentin (NEURONTIN) capsule 600 mg  600 mg Oral QHS Ivor Costa, MD   600 mg at 03/11/21 2101  . guaiFENesin (MUCINEX) 12 hr tablet 600 mg  600 mg Oral BID Mansy, Jan A, MD      . heparin injection 5,000 Units  5,000 Units Subcutaneous Q8H Ivor Costa, MD   5,000 Units at 03/12/21 [phone]  . hydrALAZINE (APRESOLINE) injection 5 mg  5 mg Intravenous Q2H PRN Ivor Costa, MD      . HYDROmorphone (DILAUDID) injection 1 mg  1 mg Intravenous Q4H PRN Nicole Kindred A, DO   1 mg at 03/12/21 0401  . insulin aspart (novoLOG) injection 0-5 Units  0-5 Units Subcutaneous QHS Ivor Costa, MD   2 Units at 03/09/21 2115  . insulin aspart (novoLOG) injection 0-9 Units  0-9 Units Subcutaneous TID WC Ivor Costa, MD   2 Units at 03/11/21 1713  . losartan (COZAAR) tablet 100 mg  100 mg Oral Daily Ivor Costa, MD   100 mg at 03/11/21 5188  . methocarbamol (ROBAXIN) tablet 500 mg  500 mg Oral Q6H PRN Ivor Costa, MD   500 mg at 03/11/21 2102  . mirtazapine (REMERON) tablet 15 mg  15 mg Oral QHS PRN Ivor Costa, MD   15 mg at 03/11/21 2102  . multivitamin with minerals tablet 1 tablet  1 tablet Oral Daily Nicole Kindred A, DO   1 tablet at 03/11/21 4166  . ondansetron (ZOFRAN) injection 4 mg  4 mg Intravenous Q8H PRN Ivor Costa, MD   4 mg at 03/12/21 0425  . oxyCODONE-acetaminophen  (PERCOCET/ROXICET) 5-325 MG per tablet 1-2 tablet  1-2 tablet Oral Q4H PRN Nicole Kindred A, DO   2 tablet at 03/11/21 2101  . polyethylene glycol (MIRALAX / GLYCOLAX) packet 17 g  17 g Oral Daily Nicole Kindred A, DO   17 g at 03/11/21 0630  . polyvinyl alcohol (LIQUIFILM TEARS) 1.4 % ophthalmic solution 1 drop  1 drop Both Eyes PRN Ivor Costa, MD      . promethazine (PHENERGAN) 12.5 mg in sodium  chloride 0.9 % 50 mL IVPB  12.5 mg Intravenous Q6H PRN Nicole Kindred A, DO   Stopped at 03/08/21 0820  . senna-docusate (Senokot-S) tablet 1 tablet  1 tablet Oral QHS PRN Ivor Costa, MD   1 tablet at 03/08/21 2018   Facility-Administered Medications Ordered in Other Encounters  Medication Dose Route Frequency Provider Last Rate Last Admin  . sodium chloride flush (NS) 0.9 % injection 10 mL  10 mL Intravenous PRN Lequita Asal, MD   10 mL at 12/03/20 1138    VITAL SIGNS: BP (!) 143/86 (BP Location: Right Arm)   Pulse (!) 121   Temp (!) 100.4 F (38 C) (Oral)   Resp 18   Ht 6' (1.829 m)   Wt 135 lb (61.2 kg)   SpO2 99%   BMI 18.31 kg/m  Filed Weights   03/06/21 0321  Weight: 135 lb (61.2 kg)    Estimated body mass index is 18.31 kg/m as calculated from the following:   Height as of this encounter: 6' (1.829 m).   Weight as of this encounter: 135 lb (61.2 kg).  LABS: CBC:    Component Value Date/Time   WBC 18.8 (H) 03/12/2021 0507   HGB 14.4 03/12/2021 0507   HCT 46.3 03/12/2021 0507   PLT 420 (H) 03/12/2021 0507   MCV 94.5 03/12/2021 0507   NEUTROABS 17.2 (H) 03/12/2021 0507   LYMPHSABS 0.6 (L) 03/12/2021 0507   MONOABS 0.9 03/12/2021 0507   EOSABS 0.0 03/12/2021 0507   BASOSABS 0.0 03/12/2021 0507   Comprehensive Metabolic Panel:    Component Value Date/Time   NA 134 (L) 03/12/2021 0507   NA 137 12/15/2015 1056   K 4.2 03/12/2021 0507   CL 95 (L) 03/12/2021 0507   CO2 24 03/12/2021 0507   BUN 16 03/12/2021 0507   BUN 8 12/15/2015 1056   CREATININE 0.64  03/12/2021 0507   CREATININE 1.07 06/16/2016 1632   GLUCOSE 159 (H) 03/12/2021 0507   CALCIUM 9.2 03/12/2021 0507   AST 24 03/12/2021 0507   ALT 26 03/12/2021 0507   ALKPHOS 81 03/12/2021 0507   BILITOT 1.1 03/12/2021 0507   BILITOT 0.5 12/15/2015 1056   PROT 7.9 03/12/2021 0507   PROT 6.5 12/15/2015 1056   ALBUMIN 3.7 03/12/2021 0507   ALBUMIN 4.2 12/15/2015 1056    RADIOGRAPHIC STUDIES: CT HEAD WO CONTRAST  Result Date: 02/24/2021 CLINICAL DATA:  Headache, history of nasopharyngeal cancer EXAM: CT HEAD WITHOUT CONTRAST TECHNIQUE: Contiguous axial images were obtained from the base of the skull through the vertex without intravenous contrast. COMPARISON:  PET-CT February 14, 2021 FINDINGS: Brain: No evidence of acute territorial infarction, hemorrhage, hydrocephalus,extra-axial collection or mass lesion/mass effect. There is dilatation the ventricles and sulci consistent with age-related atrophy. Low-attenuation changes in the deep white matter consistent with small vessel ischemia. Vascular: No hyperdense vessel or unexpected calcification. Skull: A large heterogeneous mass seen within the area of the sphenoid sinus with surrounding cortical destruction involving the sphenoid sinuses and extending into the clivus. Sinuses/Orbits: As described above a heterogeneous mass within the sphenoid sinuses extending into the nasopharynx. Other: None IMPRESSION: Heterogeneous mass within the sphenoid sinus extending into the nasopharynx causing osseous destruction of the sphenoid sinuses and clivus, unchanged from prior PET-CT. Findings consistent with age related atrophy and chronic small vessel ischemia Electronically Signed   By: Prudencio Pair M.D.   On: 02/24/2021 17:46   MR BRAIN W WO CONTRAST  Result Date: 03/06/2021 CLINICAL DATA:  Headache. History of nasopharyngeal carcinoma post treatment. EXAM: MRI HEAD WITHOUT AND WITH CONTRAST TECHNIQUE: Multiplanar, multiecho pulse sequences of the brain and  surrounding structures were obtained without and with intravenous contrast. CONTRAST:  57mL GADAVIST GADOBUTROL 1 MMOL/ML IV SOLN COMPARISON:  CT head 02/24/2021.  MRI head 08/13/2020 FINDINGS: Brain: Moderate generalized atrophy. Ventricular enlargement chronic and unchanged from prior studies. Extensive white matter hyperintensity bilaterally. Mild patchy hyperintensity in the pons. Negative for acute infarct, hemorrhage, mass. Negative for metastatic disease to the brain. Vascular: Normal arterial flow voids. Skull and upper cervical spine: Abnormal skull base described below. Otherwise negative calvarium. Sinuses/Orbits: Large nasopharyngeal mass on the prior study now shows central necrosis and peripheral irregular enhancement. This extends throughout the sphenoid sinus and into the clivus. This is the initial post treatment study and continued follow-up is warranted. Bilateral mastoid effusion. Bilateral cataract extraction Other: None IMPRESSION: Atrophy and extensive chronic microvascular ischemia. No acute infarct negative for metastatic disease to the brain Treated large nasopharyngeal mass now shows central fluid intensity and peripheral enhancement filling the nasopharynx extending into the sphenoid sinus and clivus. Continued imaging follow-up recommended. Electronically Signed   By: Franchot Gallo M.D.   On: 03/06/2021 13:59   US Carotid Duplex Right  Result Date: 03/09/2021 CLINICAL DATA:  Evaluate for giant cell arteritis. Area of concern is on the right side. EXAM: EXTRACRANIAL ARTERIAL DUPLEX LIMITED - TEMPORAL ARTERIES TECHNIQUE: Pearline Cables scale imaging, color Doppler and duplex ultrasound was performed of the bilateral temporal arteries. COMPARISON:  MRI head 03/06/2021 FINDINGS: Right temporal artery is patent with a normal high resistance waveform. There is no evidence for right temporal artery wall thickening and no evidence for a halo sign. Images of the left temporal artery were obtained for  comparison. Normal appearance of the left temporal artery. Evidence for wall atherosclerotic calcifications within the right temporal artery. IMPRESSION: Normal appearance of the bilateral temporal arteries with exception of mild atherosclerotic disease. No evidence for giant cell arteritis by sonography. Electronically Signed   By: Markus Daft M.D.   On: 03/09/2021 11:21   NM PET Image Restage (PS) Whole Body  Result Date: 02/22/2021 CLINICAL DATA:  Subsequent treatment strategy for nasopharyngeal carcinoma. EXAM: NUCLEAR MEDICINE PET WHOLE BODY TECHNIQUE: 8.6 mCi F-18 FDG was injected intravenously. Full-ring PET imaging was performed from the head to foot after the radiotracer. CT data was obtained and used for attenuation correction and anatomic localization. Fasting blood glucose: 114 mg/dl COMPARISON:  08/18/2020 FINDINGS: Mediastinal blood pool activity: SUV max 3.03 HEAD/NECK: Again noted is the previously characterized FDG avid infiltrative mass centered in the region of the sphenoid sinus and involving the posterior nasal pharynx with destructive changes of the skull base. Compared with previous exam there has been interval decrease in extent of FDG avid tumor. Based off the PET images residual FDG avid tumor measures approximately 2.1 x 2.6 by 1.5 cm (volume = 4.3 cm^3) and has SUV max 8.32. On the previous exam this mass measured 5.7 x 4.2 by 3.36 (volume = 42) and had an SUV max 11.38. No FDG avid lymph nodes within the soft tissues of the neck. Incidental CT findings: none CHEST: No FDG avid supraclavicular, axillary, mediastinal, or hilar lymph nodes. No FDG avid pulmonary nodules identified. Nonspecific FDG uptake localizing to the esophagus may reflect underlying esophagitis. No distinct mass identified. Incidental CT findings: Aortic atherosclerosis. Coronary artery atherosclerotic calcifications identified. ABDOMEN/PELVIS: No abnormal hypermetabolic activity within the liver, pancreas, adrenal  glands, or spleen.  No hypermetabolic lymph nodes in the abdomen or pelvis. Incidental CT findings: Splenic granulomas identified. A extensive aortic atherosclerosis with branch vessel involvement. Distal colonic diverticulosis noted without signs of acute inflammation SKELETON: No focal hypermetabolic activity to suggest skeletal metastasis. Incidental CT findings: none EXTREMITIES: No abnormal hypermetabolic activity in the lower extremities. Incidental CT findings: none IMPRESSION: 1. Interval decrease in size and degree of FDG uptake associated with the FDG avid mass involving the sphenoid sinus, posterior nasopharynx and skull base. 2. No signs of locoregional lymphadenopathy or distant metastatic disease. 3. Increased tracer uptake identified within the esophagus. Correlate for any clinical signs or symptoms of esophagitis. Electronically Signed   By: Kerby Moors M.D.   On: 02/22/2021 12:57   MR MRV HEAD W WO CONTRAST  Result Date: 03/11/2021 CLINICAL DATA:  Worsening headache EXAM: MR VENOGRAM HEAD WITHOUT AND WITH CONTRAST TECHNIQUE: Angiographic images of the intracranial venous structures were obtained using MRV technique without and with intravenous contrast. CONTRAST:  20mL GADAVIST GADOBUTROL 1 MMOL/ML IV SOLN COMPARISON:  03/06/2021 brain MRI FINDINGS: Superior sagittal sinus: Normal. Straight sinus: Normal. Inferior sagittal sinus, vein of Galen and internal cerebral veins: Normal. Transverse sinuses: Normal. Sigmoid sinuses: Normal. Visualized jugular veins: Normal. Other: Redemonstration of nasopharyngeal mass, as described on 03/06/2021. IMPRESSION: 1. No intracranial venous thrombosis. 2. Unchanged appearance of treated nasopharyngeal mass, since 03/06/2021. Electronically Signed   By: Ulyses Jarred M.D.   On: 03/11/2021 19:53    PERFORMANCE STATUS (ECOG) : 4 - Bedbound  Review of Systems Unable to complete  Physical Exam General: NAD Pulmonary: Unlabored Extremities: no edema, no  joint deformities Skin: no rashes Neurological: Lethargic  IMPRESSION: Patient seen for follow-up.  He is currently lethargic, likely sedated from hydromorphone, which patient has been frequently receiving for intractable pain.  Patient does wake briefly when stimulated and says that his pain remains severe in his head.  Neuro is following. Patient has been tried on a variety of medications including gabapentin, Tegretol, Depakote, phenytoin, hydromorphone, Robaxin, and Percocet. Despite this regimen, patient continues to have intractable pain.   Case discussed with oncology.  Agree with transfer to [facility] for interventional management.  In the interim, could consider starting patient on a hydromorphone PCA if needed.  Alternatively, could trial ketamine. I believe ER has established protocols and ketamine increasingly appears to have a role in facilitating comfort for those patients with uncontrolled cancer pain.   PLAN: -Continue current scope of treatment -Agree with transfer to [facility] for interventional management -Consider hydromorphone PCA for comfort until transfer   Time Total: 20 minutes  Visit consisted of counseling and education dealing with the complex and emotionally intense issues of symptom management and palliative care in the setting of serious and potentially life-threatening illness.Greater than 50%  of this time was spent counseling and coordinating care related to the above assessment and plan.  Signed by: Altha Harm, PhD, NP-C

## 2021-03-12 NOTE — Progress Notes (Signed)
Received a call from [facility] Dr. 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   I have started fluids, will get lactic acid we will get blood cultures and start empiric Zosyn and vancomycin at this time He is now back in queue for transfer to [facility] for tertiary care I will place him on a progressive care unit I have called his daughter Ernestina Patches at [phone] to give her a full update--she is aware of his bleeding this morning-I have intimated her of possible infection I have also discussed with her that the patient is now possibly septic and we will start empiric therapy She grasps the fact that we may not be able to make him better-I will review him again tomorrow and see how he is responding to the treatment-we may need to involve with oncologist oversight palliative care team if he is not responding  Verneita Griffes, MD [facility] 4:08 PM

## 2021-03-12 NOTE — Procedures (Signed)
Date: 03/12/21  Preoperative diagnosis:  Oral bleeding  Postoperative diagnosis:  Same  Procedure:  Flexible laryngoscopy  Findings:  Thick, hard crusting in nasal cavity.  Thick crust in nasopharynx.  Tongue base with no masses and remaining examination did not reveal any source of bleeding.  No obvious source of bleeding in nasopharynx or nasal cavity.  Patient was unable to phonate due to sedation/cooperation.  Extremely dry mucous membranes with crusting throughout examination.  Airway patent.  Anesthesia:  IV pain medication  Procedure:  After consent, I placed the flexible laryngoscope through the right naris and advanced it towards the nasopharynx.  The laryngoscope was flex downward and used to examine the oropharynx, hypopharynx, vocal folds, and supraglottic regions.  The patient was asked to phonate to evaluate the mobility of the vocal folds.  The laryngoscope was removed and the patient tolerated the procedure well.  The findings are above.

## 2021-03-12 NOTE — Consult Note (Signed)
Pharmacy Antibiotic Note  Kenneth Franklin is a 75 y.o. male admitted on 03/06/2021 with right-sided headache and facial pain x 2 weeks. Patient with PMH significant for stage III nasopharyngeal carcinoma s/p chemotherapy and radiation. 4/15: patient developed new onset fever, low Bps, and increasing WBC. 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 Septic workup initiated. Plan is to transfer to [facility] for tertiary care.  Pharmacy has been consulted for empiric vancomycin dosing for bacteremia/osteomyelitis and zosyn for aspiration PNA.   Plan:  Zosyn 3.375g IV q8h (4 hour infusion).  Vancomycin 1750 mg IV LD x 1  Vancomycin maintenance regimen of 750 mg Q12H. Goal AUC 400-550  Expected AUC: 528; Cmin: 15.6  Scr used: 0.8  Follow up cultures  Monitor renal function daily  Height: 6' (182.9 cm) Weight: 61.2 kg (135 lb) IBW/kg (Calculated) : 77.6  Temp (24hrs), Avg:98.5 F (36.9 C), Min:97.8 F (36.6 C), Max:100.4 F (38 C)  Recent Labs  Lab 03/06/21 1036 03/07/21 0559 03/10/21 0432 03/11/21 1128 03/12/21 0507 03/12/21 1610  WBC 11.8* 8.6  --  11.5* 18.8*  --   CREATININE 0.91 0.76 0.72  --  0.64  --   LATICACIDVEN  --   --   --   --   --  1.7    Estimated Creatinine Clearance: 72.3 mL/min (by C-G formula based on SCr of 0.64 mg/dL).    Allergies  Allergen Reactions  . Propoxyphene     Unknown reaction    Antimicrobials this admission: 4/15 vancomycin >>  4/15 Zosyn >>    Dose adjustments this admission: n/a  Microbiology results: 4/15 BCx: sent  Thank you for allowing pharmacy to be a part of this patient's care.  Dorothe Pea, PharmD, BCPS Clinical Pharmacist  03/12/2021 4:57 PM

## 2021-03-12 NOTE — Progress Notes (Signed)
Pt refused all po intake including meds.  To notify primary MD at this time

## 2021-03-12 NOTE — Progress Notes (Signed)
OT Cancellation Note  Patient Details Name: Kenneth Franklin MRN: [id number] DOB: [birth year]   Cancelled Treatment:    Reason Eval/Treat Not Completed: Medical issues which prohibited therapy. Per RN, pt not medically appropriate for OT this day. HR increasing to 135, pt bleeding from mouth. Will attempt to see pt at a later time/date as medically appropriate and available for tx.    Josiah Lobo, PhD, MS, OTR/L 03/12/21, 1:25 PM

## 2021-03-12 NOTE — Progress Notes (Signed)
   03/12/21 0839  Assess: MEWS Score  Temp (!) 100.4 F (38 C)  BP (!) 143/86  Pulse Rate (!) 121  Resp 18  SpO2 99 %  O2 Device Room Air  Assess: MEWS Score  MEWS Temp 0  MEWS Systolic 0  MEWS Pulse 2  MEWS RR 0  MEWS LOC 0  MEWS Score 2  MEWS Score Color Yellow  Assess: if the MEWS score is Yellow or Red  MEWS guidelines implemented *See Row Information* Yes  Take Vital Signs  Increase Vital Sign Frequency  Yellow: Q 2hr X 2 then Q 4hr X 2, if remains yellow, continue Q 4hrs  Escalate  MEWS: Escalate Yellow: discuss with charge nurse/RN and consider discussing with provider and RRT  Notify: Charge Nurse/RN  Name of Charge Nurse/RN Notified Caryl Pina RN  Date Charge Nurse/RN Notified 03/12/21  Time Charge Nurse/RN Notified [id number]  Notify: Provider  Provider Name/Title Samtani MD  Date Provider Notified 03/12/21  Time Provider Notified [phone]  Notification Type Page  Notification Reason Change in status  Yellow mews notified Dr Verlon Au and charge nurse Caryl Pina.

## 2021-03-12 NOTE — Progress Notes (Signed)
It was reported to me that this patient has refused ordered labs

## 2021-03-12 NOTE — Consult Note (Signed)
Kenneth Franklin, Kenneth Franklin [id number] Oct 15, 1946 Kenneth Sells, MD  Reason for Consult: blood in oral cavity, history of nasopharyngeal carcinoma s/p chemo/xrt  HPI: 75 y.o. 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  Treated with XRT and 5 weeks of cisplatin completed 4 months ago.  Initially determined to be unresectable.  MRI at that time revealed a mostly T2 intensity mass with destruction of skull base and clivus.  Patient has had progressively worsening right facial pain and headache necessitating multiple ER visits over last month.  He was last evaluated by [facility] on 4/7 and endoscopy performed at that time by Dr. Kathyrn Franklin.  He was recommended to see Neurology given significant headaches and facial pain.  Since that time, he was admitted for pain control and has developed AMS and intractable pain.  Overnight, patient had bleeding from his mouth.  ENT was consulted for recommendations.    Allergies:  Allergies  Allergen Reactions  . Propoxyphene     Unknown reaction    ROS: Review of systems normal other than 12 systems except per HPI.  PMH:  Past Medical History:  Diagnosis Date  . Acute ischemic stroke ([place]) 2017  . Alcohol abuse   . Anxiety   . Cancer ([place])   . Depression   . Diabetes mellitus without complication ([place])   . Dyspnea    pcp knows and ordered rescue inhaler  . Hyperlipidemia   . Hypertension   . Skin cancer    Squamous Cell Carcinoma In Situ    FH:  Family History  Problem Relation Age of Onset  . Diabetes Brother     SH:  Social History   Socioeconomic History  . Marital status: Widowed    Spouse name: Not on file  . Number of children: Not on file  . Years of education: Not on file  . Highest education level: Not on file  Occupational History  . Not on file  Tobacco Use  . Smoking status: Former Smoker    Packs/day: 1.00    Years: 40.00    Pack years: 40.00    Types:  Cigars, Cigarettes    Quit date: 08/12/2019    Years since quitting: 1.5  . Smokeless tobacco: Never Used  Vaping Use  . Vaping Use: Never used  Substance and Sexual Activity  . Alcohol use: Not Currently    Comment: H/O ETOH ABUSE BUT DENIES DRINKING DURING 08-11-20 INTERVIEW  . Drug use: No  . Sexual activity: Never  Other Topics Concern  . Not on file  Social History Narrative  . Not on file   Social Determinants of Health   Financial Resource Strain: Not on file  Food Insecurity: Not on file  Transportation Needs: Not on file  Physical Activity: Not on file  Stress: Not on file  Social Connections: Not on file  Intimate Partner Violence: Not on file    PSH:  Past Surgical History:  Procedure Laterality Date  . CATARACT EXTRACTION Bilateral   . COLONOSCOPY    . NASOPHARYNGOSCOPY N/A 08/12/2020   Procedure: ENDOSCOPIC NASOPHARYNGOSCOPY WITH BIOPSY;  Surgeon: Margaretha Sheffield, MD;  Location: [facility] ORS;  Service: ENT;  Laterality: N/A;  . PORTA CATH INSERTION N/A 08/24/2020   Procedure: PORTA CATH INSERTION;  Surgeon: Algernon Huxley, MD;  Location: [place] [facility];  Service: Cardiovascular;  Laterality: N/A;    Physical  Exam: GEN-  Patient sleeping and sedated, unable to completely examine  patient ENT - very dry mucous membranes; dried blood along upper and lower lips, poor dentition, very dry mucous membranes; Neck - Neck soft, no masses Resp - non-labored  MRI 03/06/21-  Large nasopharyngeal mass with increased central hypointensity consistent with necrosis presumably secondary to treatment.   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  Plan:  Scans reviewed.  No source of bleeding noted on physical examination.  No source of bleeding note on transnasal flexible laryngoscopy.  Agree with holding anticoagulation as long as medically possible.  Agree with transfer.  Continue hydration and oral care.  Please call ENT with any questions.    Kenneth Franklin 03/12/2021 11:20 AM

## 2021-03-12 NOTE — Progress Notes (Signed)
Pt found with thick rasberry colored sputum on his gown and on bed.  Oral suction done as tolerated.  Md notified and at bedside immediately.  MD and myself looked in pt mouth and could not see a source of blood.  MD stated he was contacting ENT for further management.

## 2021-03-12 NOTE — Progress Notes (Signed)
Spoke with Eddie Dibbles at [facility] transfer [facility].  Eddie Dibbles stated he would call primary

## 2021-03-12 NOTE — Care Management Important Message (Signed)
Important Message  Patient Details  Name: Kenneth Franklin MRN: [id number] Date of Birth: 1946/09/28   Medicare Important Message Given:  Yes     [name] 03/12/2021, 9:49 AM

## 2021-03-12 NOTE — Progress Notes (Signed)
Pt coughed up a raisin sized blood clot during coughing spell. C/o nausea and pain, medicated with prns. No additional bleeding noted. Pt resting quietly with eyes closed, sinus rhythm on the tele monitor. Notified Mansy, MD - see new orders.

## 2021-03-12 NOTE — Plan of Care (Signed)
End of Shift Summary:  Pt oriented to self, reoriented as needed. Continues to c/o pain 10/10 when awake, prn medications administered. C/o nausea, zofran administered x1. Pt slept more tonight than previous nights around 5hrs total.  See progress note for acute changes. Went for MRA at beginning of shift, received ativan x1, tolerated well. Urine output adequate, incontinent episodes. Continues to try to get OOB without assistance despite redirection. Frequent contacts made in lieu of call bell. Bed low and in locked position. Bed alarm on. Fall mat in place.   Problem: Pain Managment: Goal: General experience of comfort will improve Outcome: Progressing   Problem: Safety: Goal: Ability to remain free from injury will improve Outcome: Progressing

## 2021-03-12 NOTE — Progress Notes (Signed)
Report given to [facility] on 2A, pt to transfer to 259 for a higher level of care as ordered by MD

## 2021-03-12 NOTE — TOC Progression Note (Signed)
Transition of Care [facility]) - Progression Note    Patient Details  Name: Kenneth Franklin MRN: [id number] Date of Birth: October 15, 1946  Transition of Care [facility]) CM/SW Contact  Shelbie Hutching, RN Phone Number: 03/12/2021, 12:28 PM  Clinical Narrative:    Patient is being followed by Palliative.  MD has requested transfer to [facility].  TOC will cont to follow.         Expected Discharge Plan and Services                                                 Social Determinants of Health (SDOH) Interventions    Readmission Risk Interventions Readmission Risk Prevention Plan 10/27/2020  Transportation Screening Complete  PCP or Specialist Appt within 3-5 Days Complete  HRI or [place] Complete  Social Work Consult for [place] Planning/Counseling Complete  Palliative Care Screening Not Applicable  Medication Review [employer]) Complete  Some recent data might be hidden

## 2021-03-13 LAB — COMPREHENSIVE METABOLIC PANEL
ALT: 23 U/L (ref 0–44)
AST: 19 U/L (ref 15–41)
Albumin: 3.2 g/dL — ABNORMAL LOW (ref 3.5–5.0)
Alkaline Phosphatase: 76 U/L (ref 38–126)
Anion gap: 13 (ref 5–15)
BUN: 16 mg/dL (ref 8–23)
CO2: 24 mmol/L (ref 22–32)
Calcium: 8.4 mg/dL — ABNORMAL LOW (ref 8.9–10.3)
Chloride: 97 mmol/L — ABNORMAL LOW (ref 98–111)
Creatinine, Ser: 0.66 mg/dL (ref 0.61–1.24)
GFR, Estimated: >60 mL/min (ref >60)
Glucose, Bld: 176 mg/dL — ABNORMAL HIGH (ref 70–99)
Potassium: 3.9 mmol/L (ref 3.5–5.1)
Sodium: 134 mmol/L — ABNORMAL LOW (ref 135–145)
Total Bilirubin: 0.9 mg/dL (ref 0.3–1.2)
Total Protein: 7.2 g/dL (ref 6.5–8.1)

## 2021-03-13 LAB — CBC WITH DIFFERENTIAL/PLATELET
Abs Immature Granulocytes: 0.31 10*3/uL — ABNORMAL HIGH (ref 0.00–0.07)
Basophils Absolute: 0 10*3/uL (ref 0.0–0.1)
Basophils Relative: 0 %
Eosinophils Absolute: 0 10*3/uL (ref 0.0–0.5)
Eosinophils Relative: 0 %
HCT: 39.4 % (ref 39.0–52.0)
Hemoglobin: 12.6 g/dL — ABNORMAL LOW (ref 13.0–17.0)
Immature Granulocytes: 1 %
Lymphocytes Relative: 3 %
Lymphs Abs: 0.6 10*3/uL — ABNORMAL LOW (ref 0.7–4.0)
MCH: 28.8 pg (ref 26.0–34.0)
MCHC: 32 g/dL (ref 30.0–36.0)
MCV: 90 fL (ref 80.0–100.0)
Monocytes Absolute: 1.4 10*3/uL — ABNORMAL HIGH (ref 0.1–1.0)
Monocytes Relative: 6 %
Neutro Abs: 20 10*3/uL — ABNORMAL HIGH (ref 1.7–7.7)
Neutrophils Relative %: 90 %
Platelets: 363 10*3/uL (ref 150–400)
RBC: 4.38 MIL/uL (ref 4.22–5.81)
RDW: 13.7 % (ref 11.5–15.5)
WBC: 22.3 10*3/uL — ABNORMAL HIGH (ref 4.0–10.5)
nRBC: 0 % (ref 0.0–0.2)

## 2021-03-13 LAB — GLUCOSE, CAPILLARY
Glucose-Capillary: 188 mg/dL — ABNORMAL HIGH (ref 70–99)
Glucose-Capillary: 159 mg/dL — ABNORMAL HIGH (ref 70–99)
Glucose-Capillary: 171 mg/dL — ABNORMAL HIGH (ref 70–99)
Glucose-Capillary: 147 mg/dL — ABNORMAL HIGH (ref 70–99)

## 2021-03-13 NOTE — Progress Notes (Signed)
PROGRESS NOTE   CHEZ BULNES  HDQ:[id number] DOB: 04/23/1946 DOA: 03/06/2021 PCP: Verita Lamb, NP  Brief Narrative:   75 y.o. male with stage III nasopharyngeal carcinoma s/p chemotherapy and radiation who presented to the ED on 03/06/21 + right-sided headache X 2 weeks He had previously sought treatment in ED at [facility] and here at [facility] during this time, daughter reported 9 ED visits in the prior 10 days.  nasopharyngeal cancer since September 2021 after presenting with a 39-month persistent headache.  chemo and radiation therapy, finished on 10/21/2020--declined further adjuvant therapy.  He had PET scan 02/22/2021 showing interval decrease in the size and FDG uptake of the mass.  [facility] neurology evaluated 03/02/2021--increased gabapentin, added Depakote (?  Giant cell arteritis) headaches DDx perineural spread of his malignancy.   MRI here on 03/06/2021 revealed no clear evidence of progressive disease however.  Admitted to hospitalist service with Neurology consulted.  Oncology is following as well.    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  Giant cell arteritis/temporal arteritis remains in the differential however.  Ultrasound of temporal artery to look for halo sign is planned per neurology.  Patient received IV lidocaine infusion on 4/10 for both diagnostic and therapeutic purposes.   He has had no response to several agents and it was discussed with neurology next steps-we consulted Dr. Zollie Scale of pain management who only performs outpatient sphenopalatine blocks  Patient in the process of being transferred to [facility] At this time ENT Dr.Abi Hachem has declined to take him on his service  Hospital-Problem based course Intractable pain secondary to nasopharyngeal carcinoma Continuing Tegretol Klonopin  Depakote gabapentin and Robaxin  No response to intravenous lidocaine infusion,  no response to IV fosphenytoin either Continue Dilaudid 1 mg every 4 as needed for severe pain, have increased it to every 3 as needed Appreciate neurology oncology input ?  Nasopharyngeal bleeding Leukocytosis with low-grade temperatures Dr. 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 Rico Junker of the hospitalist service at [facility] Patient has leukocytosis and tachycardia 4/15 therefore patient started on vancomycin and Zosyn blood cultures from 4/15 pending Continue fluids 100 cc an hour --does not have lactic acidosis Nasal pharyngeal carcinoma At the request of [facility], we did discussed with Dr. Deneen Harts of Radiology Based on MRI and PET's and recent MRIs'  --there is concern for peri-neural spread, or nerve injury per the radiologist P/W Dr. Mike Gip (oncologist) 4/14 who also feels transfer is extremely reasonable Case discussed with Dr. Cari Caraway of neurosurgery on 4/15 who has attempted to facilitate transfer to [facility] for either pain management issues or operative management of pain?  Rhizotomy?  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 Aldona Bar who accepts the patient in consult Remains inpatient appropriate because:IV treatments appropriate due to intensity of illness or inability to take PO   Dispo: The patient is from: Home              Anticipated d/c is to: Needs transfer to tertiary care center-this is in the process              Patient currently is not medically stable to d/c.   Difficult to place patient Yes   Consultants:   Neurology  Oncology  Interventional pain management telephone consulted  Procedures: Multiple  Antimicrobials: None   Subjective:  Somnolent when I saw him in the morning When I return in the afternoon he is writhing and moaning Nurse reports he has not eaten or drunk    Objective: Vitals:   03/13/21 0336 03/13/21 0500 03/13/21 0734 03/13/21 1159  BP: (!) 175/69  (!) 158/84 (!) 160/74  Pulse: 89  83 93  Resp: 20  18 17   Temp: 98.7 F (37.1 C)  100 F (37.8 C) 97.7 F (36.5 C)  TempSrc: Oral  Oral   SpO2: 96%  97% 94%  Weight:  62.4 kg    Height:        Intake/Output Summary (Last 24 hours) at 03/13/2021 1615 Last data filed at 03/13/2021 1548 Gross per 24 hour  Intake 1458.18 ml  Output 0 ml  Net 1458.18 ml   Filed Weights   03/06/21 0321 03/12/21 1843 03/13/21 0500  Weight: 61.2 kg 63.1 kg 62.4 kg    Examination:  Somnolent frail no distress EOMI CTA B S1-S2 no murmur no rub no gallop Abdomen soft  Data Reviewed: personally reviewed   CBC    Component Value Date/Time   WBC 22.3 (H) 03/13/2021 0723   RBC 4.38 03/13/2021 0723   HGB 12.6 (L) 03/13/2021 0723   HCT 39.4 03/13/2021 0723   PLT 363 03/13/2021 0723   MCV 90.0 03/13/2021 0723   MCH 28.8 03/13/2021 0723   MCHC 32.0 03/13/2021 0723   RDW 13.7 03/13/2021 0723   LYMPHSABS 0.6 (L) 03/13/2021 0723   MONOABS 1.4 (H) 03/13/2021 0723   EOSABS 0.0 03/13/2021 0723   BASOSABS 0.0 03/13/2021 0723   CMP Latest Ref Rng & Units 03/13/2021 03/12/2021 03/10/2021  Glucose 70 - 99 mg/dL  176(H) 159(H) 127(H)  BUN 8 - 23 mg/dL 16 16 18   Creatinine 0.61 - 1.24 mg/dL 0.66 0.64 0.72  Sodium 135 - 145 mmol/L 134(L) 134(L) 136  Potassium 3.5 - 5.1 mmol/L 3.9 4.2 3.9  Chloride 98 - 111 mmol/L 97(L) 95(L) 94(L)  CO2 22 - 32 mmol/L 24 24 29   Calcium 8.9 - 10.3 mg/dL 8.4(L) 9.2 9.7  Total Protein 6.5 - 8.1 g/dL 7.2 7.9 -  Total Bilirubin 0.3 - 1.2 mg/dL 0.9 1.1 -  Alkaline Phos 38 - 126 U/L 76 81 -  AST 15 - 41 U/L 19 24 -  ALT 0 - 44 U/L 23 26 -     Radiology Studies: MR MRV HEAD W WO CONTRAST  Result Date: 03/11/2021 CLINICAL DATA:  Worsening headache EXAM: MR VENOGRAM HEAD WITHOUT AND WITH CONTRAST TECHNIQUE: Angiographic images of the intracranial venous structures were obtained using MRV technique without and with intravenous contrast. CONTRAST:  6mL GADAVIST GADOBUTROL 1 MMOL/ML IV SOLN COMPARISON:  03/06/2021 brain MRI FINDINGS: Superior sagittal sinus: Normal. Straight sinus: Normal. Inferior sagittal  sinus, vein of Galen and internal cerebral veins: Normal. Transverse sinuses: Normal. Sigmoid sinuses: Normal. Visualized jugular veins: Normal. Other: Redemonstration of nasopharyngeal mass, as described on 03/06/2021. IMPRESSION: 1. No intracranial venous thrombosis. 2. Unchanged appearance of treated nasopharyngeal mass, since 03/06/2021. Electronically Signed   By: Ulyses Jarred M.D.   On: 03/11/2021 19:53     Scheduled Meds: . carbamazepine  100 mg Oral QID  . clonazepam  0.25 mg Oral TID  . divalproex  500 mg Oral QHS  . [START ON 03/20/2021] divalproex  750 mg Oral QHS  . escitalopram  20 mg Oral Daily  . famotidine  40 mg Oral QPM  . feeding supplement  237 mL Oral TID BM  . gabapentin  300 mg Oral Q0600  . gabapentin  600 mg Oral QHS  . guaiFENesin  600 mg Oral BID  . heparin  5,000 Units Subcutaneous Q8H  . insulin aspart  0-5 Units Subcutaneous QHS  . insulin aspart  0-9 Units Subcutaneous TID WC  . losartan  100 mg Oral Daily  . multivitamin with minerals   1 tablet Oral Daily  . polyethylene glycol  17 g Oral Daily   Continuous Infusions: . sodium chloride 100 mL/hr at 03/13/21 0348  . piperacillin-tazobactam (ZOSYN)  IV 3.375 g (03/13/21 1459)  . promethazine (PHENERGAN) injection (IM or IVPB) Stopped (03/08/21 0820)  . vancomycin 750 mg (03/13/21 9166)     LOS: 6 days   Time spent: 50 minutes  [name], MD [facility] To contact the attending provider between 7A-7P or the covering provider during after hours 7P-7A, please log into the web site [email or web address] and access using universal [place] password for that web site. If you do not have the password, please call the hospital operator.  03/13/2021, 4:15 PM

## 2021-03-14 MED ORDER — PIPERACILLIN-TAZOBACTAM 3.375 G IVPB: 3.3750 g | Freq: Three times a day (TID) | INTRAVENOUS | Status: DC

## 2021-03-14 MED ORDER — POLYETHYLENE GLYCOL 3350 17 G PO PACK: 17.0000 g | PACK | Freq: Every day | ORAL | 0 refills | Status: DC

## 2021-03-14 MED ORDER — CARBAMAZEPINE 100 MG PO CHEW: 100.0000 mg | CHEWABLE_TABLET | Freq: Four times a day (QID) | ORAL | 0 refills | Status: DC

## 2021-03-14 MED ORDER — VANCOMYCIN IV (FOR PTA / DISCHARGE USE ONLY)
750.0000 mg | Freq: Two times a day (BID) | INTRAVENOUS | Status: AC
Start: 2021-03-14 — End: 2021-03-18

## 2021-03-14 MED ORDER — SODIUM CHLORIDE 0.9 % IV SOLN: 111.0000 mL | INTRAVENOUS | 0 refills | Status: DC

## 2021-03-14 MED ORDER — CLONAZEPAM 0.25 MG PO TBDP: 0.2500 mg | ORAL_TABLET | Freq: Three times a day (TID) | ORAL | 0 refills | Status: DC

## 2021-03-14 NOTE — Progress Notes (Signed)
Attempt to call report to [facility] x4 this shift. I left my number with the unit to call me back in order to receive report on the fourth attempt. ETA for transport approximately 0520.

## 2021-03-14 NOTE — Progress Notes (Signed)
Report given to [place], Therapist, sports at [facility]. Questions answered and encouraged.

## 2021-03-14 NOTE — Discharge Summary (Addendum)
Physician Discharge Summary  Kenneth Franklin TSV:[id number] DOB: January 05, 1946 DOA: 03/06/2021  PCP: Verita Lamb, NP  Admit date: 03/06/2021 Discharge date: 03/14/2021  Time spent: 85 minutes  Recommendations for Outpatient Follow-up:  1. Patient being transferred to [facility] for subspecialty ENT and neurosurgery care 2. Consideration for resumption of aspirin Plavix for secondary stroke prevention as an outpatient once all procedures performed-patient had nasopharyngeal bleeding and is not a candidate for resumption at this time 3. Patient transferred to the service of Dr. 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  nasopharyngeal cancer since September 2021 after presenting with a 11-month persistent headache.  chemo and radiation therapy, finished on 10/21/2020--declined further adjuvant therapy.  He had PET scan 02/22/2021 showing interval decrease in the size and FDG uptake of the mass.  [facility] neurology evaluated 03/02/2021--increased gabapentin, added Depakote (?  Giant cell arteritis) headaches DDx perineural spread of his malignancy.   MRI here on 03/06/2021 revealed no clear evidence of progressive disease however.  Admitted to hospitalist service with Neurology consulted.  Oncology is following as  well.    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  Giant cell arteritis/temporal arteritis remains in the differential however.  Ultrasound of temporal artery to look for halo sign is planned per neurology.  Patient received IV lidocaine infusion on 4/10 for both diagnostic and therapeutic purposes.   He has had no response to several agents and it was discussed with neurology next steps-we consulted Dr. Zollie Scale of pain management who only performs outpatient sphenopalatine blocks  Patient in the process of being transferred to [facility] At this time ENT Dr.Abi Hachem has declined to take him on his [facility] Course:  Intractable pain secondary to nasopharyngeal carcinoma Continuing Tegretol Klonopin  Depakote gabapentin and Robaxin  No response to intravenous lidocaine infusion, no response to IV fosphenytoin either Continue Dilaudid 1 mg every 4 as needed for severe pain, have increased it to every 3 as needed Appreciate neurology oncology input ?  Nasopharyngeal bleeding Leukocytosis with low-grade temperatures Dr. 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 Rico Junker of the hospitalist service at [facility] Patient has leukocytosis and tachycardia 4/15 therefore patient started on vancomycin and Zosyn blood cultures from 4/15 pending Continue fluids 100 cc an hour --does not have lactic acidosis Nasal pharyngeal carcinoma At the request of [facility], we did discussed with Dr. Deneen Harts of Radiology here at [facility] Based on MRI and PET's and recent MRIs'  --there is concern for peri-neural spread,  or nerve injury per the radiologist P/W Dr.  Mike Gip (oncologist) 4/14 who also feels transfer is extremely reasonable Case discussed with Dr. Cari Caraway of neurosurgery on 4/15 who has attempted to facilitate transfer to [facility] for either pain management issues or operative management of pain?  Rhizotomy?  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 Cari Caraway  ENT Dr. Sheppard Coil  Neurologist Dr. Lorrin Goodell  Telephone consulted pain management Dr. Zollie Scale  Discharge Exam: Vitals:   03/14/21 0300 03/14/21 0526  BP: 110/72 (!) 161/77  Pulse: 99 71  Resp:  18  Temp: 98.7 F (37.1 C) 97.7 F (36.5 C)  SpO2: 97% 98%    Subj on day of d/c   Patient not seen on day of discharge as a transfer to [facility] on discharge  Patient unable to be examined as transferred at 0 500  Discharge Instructions  NOTE__MEDS MAY NOT BE ACCURATE--patient left before I coud see him and verify some doses of meds   Allergies as of 03/14/2021      Reactions   Propoxyphene    Unknown reaction      Medication List    STOP taking these medications   ALPRAZolam 0.25 MG tablet Commonly known as: XANAX   aspirin EC 81 MG tablet   busPIRone 10 MG tablet Commonly known as: BUSPAR   clopidogrel 75 MG tablet Commonly known as: PLAVIX   Denta 5000 Plus 1.1 % Crea dental cream Generic drug: sodium fluoride   feeding supplement Liqd   glipiZIDE 5 MG tablet Commonly known as: GLUCOTROL   lidocaine-prilocaine cream Commonly known as: EMLA   metFORMIN 500 MG 24 hr tablet Commonly known as:  GLUCOPHAGE-XR   sodium chloride 0.65 % Soln nasal spray Commonly known as: OCEAN   SUMAtriptan 25 MG tablet Commonly known as: IMITREX     TAKE these medications   atorvastatin 80 MG tablet Commonly known as: LIPITOR TAKE 1 TABLET (80 MG TOTAL) BY MOUTH NIGHTLY. What changed: See the new instructions.   carbamazepine 100 MG chewable tablet Commonly known as: TEGRETOL Chew 1 tablet (100 mg total) by mouth 4 (four) times daily.   clonazePAM 0.25 MG disintegrating tablet Commonly known as: KLONOPIN Take 1 tablet (0.25 mg total) by mouth 3 (three) times daily.   divalproex 250 MG 24 hr tablet Commonly known as: DEPAKOTE ER Take 250-750 mg by mouth See admin instructions. Take 1 tablet (250mg ) by mouth at bedtime for 7 nights, then take 2 tablets (500mg ) by mouth at bedtime for 7 nights then take 3 tablets (750mg ) by mouth at bedtime   escitalopram 20 MG tablet Commonly known as: LEXAPRO Take 20 mg by mouth every morning.   famotidine 40 MG tablet Commonly known as: PEPCID Take 1 tablet (40 mg total) by mouth every evening.   fluticasone 110 MCG/ACT inhaler Commonly known as: FLOVENT HFA Inhale 2 puffs into the lungs daily as needed (shortness of breath).   gabapentin 300 MG capsule Commonly known as: NEURONTIN Take 300-600 mg by mouth See admin instructions. Take 2 capsules (600mg ) by mouth at  bedtime for 7 days then take 1 capsule (300mg ) by mouth in the morning and 2 capsules (600mg ) by mouth at night   HYDROcodone-acetaminophen 5-325 MG tablet Commonly known as: Norco Take 1 tablet by mouth every 6 (six) hours as needed for moderate pain.   losartan 100 MG tablet Commonly known as: COZAAR TAKE 1 TABLET (100 MG TOTAL) BY MOUTH DAILY. What changed: when to take this   methocarbamol 500 MG tablet Commonly known as: Robaxin Take 1 tablet (500 mg total) by mouth every 6 (six) hours as needed for muscle spasms (breakthrough pain).   mirtazapine 30 MG tablet Commonly  known as: REMERON Take 1 tablet (30 mg total) by mouth at bedtime. What changed:   how much to take  when to take this  reasons to take this   piperacillin-tazobactam 3.375 GM/50ML IVPB Commonly known as: ZOSYN Inject 50 mLs (3.375 g total) into the vein every 8 (eight) hours.   polyethylene glycol 17 g packet Commonly known as: MIRALAX / GLYCOLAX Take 17 g by mouth daily.   polyvinyl alcohol 1.4 % ophthalmic solution Commonly known as: LIQUIFILM TEARS Place 1 drop into both eyes as needed for dry eyes.   sodium chloride 0.9 % infusion Inject 111 mLs into the vein continuous.   vancomycin  IVPB Inject 750 mg into the vein every 12 (twelve) hours for 7 doses.            Discharge Care Instructions  (From admission, onward)         Start     Ordered   03/14/21 0000  Change dressing on IV access line weekly and PRN  (Home infusion instructions - [facility] )        03/14/21 1611           Allergies  Allergen Reactions  . Propoxyphene     Unknown reaction    Follow-up Information    Lequita Asal, MD Follow up.   Specialty: Hematology and Oncology Contact information: [place] [address] [phone]        Gillis Santa, MD. Schedule an appointment as soon as possible for a visit.   Specialty: Pain Medicine Why: Referral for possible IV lidocaine infusions for severe trigeminal neuralgia Contact information: [place] [address] [phone]        Verita Lamb, NP. Schedule an appointment as soon as possible for a visit in 1 week(s).   Why: Hospital follow up Contact information: [address] [place] [address] [phone]                The results of significant diagnostics from this hospitalization (including imaging, microbiology, ancillary and laboratory) are listed below for reference.    Significant Diagnostic Studies: CT HEAD WO CONTRAST  Result Date:  02/24/2021 CLINICAL DATA:  Headache, history of nasopharyngeal cancer EXAM: CT HEAD WITHOUT CONTRAST TECHNIQUE: Contiguous axial images were obtained from the base of the skull through the vertex without intravenous contrast. COMPARISON:  PET-CT February 14, 2021 FINDINGS: Brain: No evidence of acute territorial infarction, hemorrhage, hydrocephalus,extra-axial collection or mass lesion/mass effect. There is dilatation the ventricles and sulci consistent with age-related atrophy. Low-attenuation changes in the deep white matter consistent with small vessel ischemia. Vascular: No hyperdense vessel or unexpected calcification. Skull: A large heterogeneous mass seen within the area of the sphenoid sinus with surrounding cortical destruction involving the sphenoid sinuses and extending into the clivus. Sinuses/Orbits: As described above a  heterogeneous mass within the sphenoid sinuses extending into the nasopharynx. Other: None IMPRESSION: Heterogeneous mass within the sphenoid sinus extending into the nasopharynx causing osseous destruction of the sphenoid sinuses and clivus, unchanged from prior PET-CT. Findings consistent with age related atrophy and chronic small vessel ischemia Electronically Signed   By: Prudencio Pair M.D.   On: 02/24/2021 17:46   MR BRAIN W WO CONTRAST  Result Date: 03/06/2021 CLINICAL DATA:  Headache. History of nasopharyngeal carcinoma post treatment. EXAM: MRI HEAD WITHOUT AND WITH CONTRAST TECHNIQUE: Multiplanar, multiecho pulse sequences of the brain and surrounding structures were obtained without and with intravenous contrast. CONTRAST:  26mL GADAVIST GADOBUTROL 1 MMOL/ML IV SOLN COMPARISON:  CT head 02/24/2021.  MRI head 08/13/2020 FINDINGS: Brain: Moderate generalized atrophy. Ventricular enlargement chronic and unchanged from prior studies. Extensive white matter hyperintensity bilaterally. Mild patchy hyperintensity in the pons. Negative for acute infarct, hemorrhage, mass. Negative for  metastatic disease to the brain. Vascular: Normal arterial flow voids. Skull and upper cervical spine: Abnormal skull base described below. Otherwise negative calvarium. Sinuses/Orbits: Large nasopharyngeal mass on the prior study now shows central necrosis and peripheral irregular enhancement. This extends throughout the sphenoid sinus and into the clivus. This is the initial post treatment study and continued follow-up is warranted. Bilateral mastoid effusion. Bilateral cataract extraction Other: None IMPRESSION: Atrophy and extensive chronic microvascular ischemia. No acute infarct negative for metastatic disease to the brain Treated large nasopharyngeal mass now shows central fluid intensity and peripheral enhancement filling the nasopharynx extending into the sphenoid sinus and clivus. Continued imaging follow-up recommended. Electronically Signed   By: Franchot Gallo M.D.   On: 03/06/2021 13:59   US Carotid Duplex Right  Result Date: 03/09/2021 CLINICAL DATA:  Evaluate for giant cell arteritis. Area of concern is on the right side. EXAM: EXTRACRANIAL ARTERIAL DUPLEX LIMITED - TEMPORAL ARTERIES TECHNIQUE: Pearline Cables scale imaging, color Doppler and duplex ultrasound was performed of the bilateral temporal arteries. COMPARISON:  MRI head 03/06/2021 FINDINGS: Right temporal artery is patent with a normal high resistance waveform. There is no evidence for right temporal artery wall thickening and no evidence for a halo sign. Images of the left temporal artery were obtained for comparison. Normal appearance of the left temporal artery. Evidence for wall atherosclerotic calcifications within the right temporal artery. IMPRESSION: Normal appearance of the bilateral temporal arteries with exception of mild atherosclerotic disease. No evidence for giant cell arteritis by sonography. Electronically Signed   By: Markus Daft M.D.   On: 03/09/2021 11:21   NM PET Image Restage (PS) Whole Body  Result Date:  02/22/2021 CLINICAL DATA:  Subsequent treatment strategy for nasopharyngeal carcinoma. EXAM: NUCLEAR MEDICINE PET WHOLE BODY TECHNIQUE: 8.6 mCi F-18 FDG was injected intravenously. Full-ring PET imaging was performed from the head to foot after the radiotracer. CT data was obtained and used for attenuation correction and anatomic localization. Fasting blood glucose: 114 mg/dl COMPARISON:  08/18/2020 FINDINGS: Mediastinal blood pool activity: SUV max 3.03 HEAD/NECK: Again noted is the previously characterized FDG avid infiltrative mass centered in the region of the sphenoid sinus and involving the posterior nasal pharynx with destructive changes of the skull base. Compared with previous exam there has been interval decrease in extent of FDG avid tumor. Based off the PET images residual FDG avid tumor measures approximately 2.1 x 2.6 by 1.5 cm (volume = 4.3 cm^3) and has SUV max 8.32. On the previous exam this mass measured 5.7 x 4.2 by 3.36 (volume = 42) and had an  SUV max 11.38. No FDG avid lymph nodes within the soft tissues of the neck. Incidental CT findings: none CHEST: No FDG avid supraclavicular, axillary, mediastinal, or hilar lymph nodes. No FDG avid pulmonary nodules identified. Nonspecific FDG uptake localizing to the esophagus may reflect underlying esophagitis. No distinct mass identified. Incidental CT findings: Aortic atherosclerosis. Coronary artery atherosclerotic calcifications identified. ABDOMEN/PELVIS: No abnormal hypermetabolic activity within the liver, pancreas, adrenal glands, or spleen. No hypermetabolic lymph nodes in the abdomen or pelvis. Incidental CT findings: Splenic granulomas identified. A extensive aortic atherosclerosis with branch vessel involvement. Distal colonic diverticulosis noted without signs of acute inflammation SKELETON: No focal hypermetabolic activity to suggest skeletal metastasis. Incidental CT findings: none EXTREMITIES: No abnormal hypermetabolic activity in the  lower extremities. Incidental CT findings: none IMPRESSION: 1. Interval decrease in size and degree of FDG uptake associated with the FDG avid mass involving the sphenoid sinus, posterior nasopharynx and skull base. 2. No signs of locoregional lymphadenopathy or distant metastatic disease. 3. Increased tracer uptake identified within the esophagus. Correlate for any clinical signs or symptoms of esophagitis. Electronically Signed   By: Kerby Moors M.D.   On: 02/22/2021 12:57   MR MRV HEAD W WO CONTRAST  Result Date: 03/11/2021 CLINICAL DATA:  Worsening headache EXAM: MR VENOGRAM HEAD WITHOUT AND WITH CONTRAST TECHNIQUE: Angiographic images of the intracranial venous structures were obtained using MRV technique without and with intravenous contrast. CONTRAST:  95mL GADAVIST GADOBUTROL 1 MMOL/ML IV SOLN COMPARISON:  03/06/2021 brain MRI FINDINGS: Superior sagittal sinus: Normal. Straight sinus: Normal. Inferior sagittal sinus, vein of Galen and internal cerebral veins: Normal. Transverse sinuses: Normal. Sigmoid sinuses: Normal. Visualized jugular veins: Normal. Other: Redemonstration of nasopharyngeal mass, as described on 03/06/2021. IMPRESSION: 1. No intracranial venous thrombosis. 2. Unchanged appearance of treated nasopharyngeal mass, since 03/06/2021. Electronically Signed   By: Ulyses Jarred M.D.   On: 03/11/2021 19:53    Microbiology: Recent Results (from the past 240 hour(s))  SARS CORONAVIRUS 2 (TAT 6-24 HRS) Nasopharyngeal Nasopharyngeal Swab     Status: None   Collection Time: 03/06/21  9:35 AM   Specimen: Nasopharyngeal Swab  Result Value Ref Range Status   SARS Coronavirus 2 NEGATIVE NEGATIVE Final    Comment: (NOTE) SARS-CoV-2 target nucleic acids are NOT DETECTED.  The SARS-CoV-2 RNA is generally detectable in upper and lower respiratory specimens during the acute phase of infection. Negative results do not preclude SARS-CoV-2 infection, do not rule out co-infections with other  pathogens, and should not be used as the sole basis for treatment or other patient management decisions. Negative results must be combined with clinical observations, patient history, and epidemiological information. The expected result is Negative.  Fact Sheet for Patients: SugarRoll.be  Fact Sheet for Healthcare Providers: https://www.woods-mathews.com/  This test is not yet approved or cleared by the [place] FDA and  has been authorized for detection and/or diagnosis of SARS-CoV-2 by FDA under an Emergency Use Authorization (EUA). This EUA will remain  in effect (meaning this test can be used) for the duration of the COVID-19 declaration under Se ction 564(b)(1) of the Act, 21 U.S.C. section 360bbb-3(b)(1), unless the authorization is terminated or revoked sooner.  Performed at [facility], [place] [address], [place], [place] [address]   Culture, blood (Routine X 2) w Reflex to ID Panel     Status: None (Preliminary result)   Collection Time: 03/12/21  4:10 PM   Specimen: BLOOD RIGHT HAND  Result Value Ref Range Status  Specimen Description BLOOD RIGHT HAND  Final   Special Requests   Final    BOTTLES DRAWN AEROBIC AND ANAEROBIC Blood Culture adequate volume   Culture   Final    NO GROWTH 2 DAYS Performed at [facility], [address], [place], [place] [address]    Report Status PENDING  Incomplete  Culture, blood (Routine X 2) w Reflex to ID Panel     Status: None (Preliminary result)   Collection Time: 03/12/21  4:22 PM   Specimen: BLOOD LEFT HAND  Result Value Ref Range Status   Specimen Description BLOOD LEFT HAND  Final   Special Requests   Final    BOTTLES DRAWN AEROBIC AND ANAEROBIC Blood Culture adequate volume   Culture   Final    NO GROWTH 2 DAYS Performed at [facility], [place], [place] [address]    Report Status PENDING  Incomplete     Labs: Basic Metabolic  Panel: Recent Labs  Lab 03/10/21 0432 03/12/21 0507 03/13/21 0723  NA 136 134* 134*  K 3.9 4.2 3.9  CL 94* 95* 97*  CO2 29 24 24   GLUCOSE 127* 159* 176*  BUN 18 16 16   CREATININE 0.72 0.64 0.66  CALCIUM 9.7 9.2 8.4*   Liver Function Tests: Recent Labs  Lab 03/12/21 0507 03/13/21 0723  AST 24 19  ALT 26 23  ALKPHOS 81 76  BILITOT 1.1 0.9  PROT 7.9 7.2  ALBUMIN 3.7 3.2*   No results for input(s): LIPASE, AMYLASE in the last 168 hours. No results for input(s): AMMONIA in the last 168 hours. CBC: Recent Labs  Lab 03/11/21 1128 03/12/21 0507 03/12/21 1403 03/12/21 1622 03/12/21 2220 03/13/21 0723  WBC 11.5* 18.8*  --   --   --  22.3*  NEUTROABS 9.8* 17.2*  --   --   --  20.0*  HGB 12.4* 14.4 12.5* 12.1* 12.5* 12.6*  HCT 38.2* 46.3 38.8* 37.2* 38.8* 39.4  MCV 89.3 94.5  --   --   --  90.0  PLT 374 420*  --   --   --  363   Cardiac Enzymes: No results for input(s): CKTOTAL, CKMB, CKMBINDEX, TROPONINI in the last 168 hours. BNP: BNP (last 3 results) No results for input(s): BNP in the last 8760 hours.  ProBNP (last 3 results) No results for input(s): PROBNP in the last 8760 hours.  CBG: Recent Labs  Lab 03/12/21 2114 03/13/21 0805 03/13/21 1156 03/13/21 1636 03/13/21 2135  GLUCAP 142* 188* 159* 171* 147*       Signed:  [name] MD   [facility] 03/14/2021, 7:44 AM

## 2021-03-17 LAB — CULTURE, BLOOD (ROUTINE X 2)
Culture: NO GROWTH
Culture: NO GROWTH
Special Requests: ADEQUATE
Special Requests: ADEQUATE

## 2021-03-29 ENCOUNTER — Other Ambulatory Visit: Payer: Medicare Other

## 2021-03-29 ENCOUNTER — Ambulatory Visit: Payer: Medicare Other | Admitting: Oncology

## 2021-04-11 ENCOUNTER — Emergency Department: Payer: Medicare Other

## 2021-04-11 ENCOUNTER — Other Ambulatory Visit: Payer: Self-pay

## 2021-04-11 ENCOUNTER — Emergency Department
Admission: EM | Admit: 2021-04-11 | Discharge: 2021-04-11 | Disposition: A | Discharge status: Home / Self Care | Payer: Medicare Other | Attending: Emergency Medicine | Admitting: Emergency Medicine

## 2021-04-11 DIAGNOSIS — S301XXA Contusion of abdominal wall, initial encounter: Secondary | ICD-10-CM | POA: Insufficient documentation

## 2021-04-11 DIAGNOSIS — Z85828 Personal history of other malignant neoplasm of skin: Secondary | ICD-10-CM | POA: Insufficient documentation

## 2021-04-11 DIAGNOSIS — E119 Type 2 diabetes mellitus without complications: Secondary | ICD-10-CM | POA: Diagnosis not present

## 2021-04-11 DIAGNOSIS — J441 Chronic obstructive pulmonary disease with (acute) exacerbation: Secondary | ICD-10-CM | POA: Insufficient documentation

## 2021-04-11 DIAGNOSIS — W050XXA Fall from non-moving wheelchair, initial encounter: Secondary | ICD-10-CM | POA: Insufficient documentation

## 2021-04-11 DIAGNOSIS — W19XXXA Unspecified fall, initial encounter: Secondary | ICD-10-CM

## 2021-04-11 DIAGNOSIS — F0390 Unspecified dementia without behavioral disturbance: Secondary | ICD-10-CM | POA: Diagnosis not present

## 2021-04-11 DIAGNOSIS — I10 Essential (primary) hypertension: Secondary | ICD-10-CM | POA: Insufficient documentation

## 2021-04-11 DIAGNOSIS — Z7902 Long term (current) use of antithrombotics/antiplatelets: Secondary | ICD-10-CM | POA: Diagnosis not present

## 2021-04-11 DIAGNOSIS — Z79899 Other long term (current) drug therapy: Secondary | ICD-10-CM | POA: Insufficient documentation

## 2021-04-11 DIAGNOSIS — Z87891 Personal history of nicotine dependence: Secondary | ICD-10-CM | POA: Insufficient documentation

## 2021-04-11 DIAGNOSIS — R519 Headache, unspecified: Secondary | ICD-10-CM | POA: Insufficient documentation

## 2021-04-11 DIAGNOSIS — M5459 Other low back pain: Secondary | ICD-10-CM | POA: Diagnosis not present

## 2021-04-11 DIAGNOSIS — S3991XA Unspecified injury of abdomen, initial encounter: Secondary | ICD-10-CM | POA: Diagnosis present

## 2021-04-11 NOTE — ED Notes (Signed)
Patient speaking to brother Darryl at this time. RN dialed number for patient

## 2021-04-11 NOTE — ED Notes (Signed)
Patient on phone with Darryl (brother) at this time

## 2021-04-11 NOTE — Discharge Instructions (Addendum)
Patient was not in any pain I evaluated him.  CT scans were negative.  Return the ER for any other concern  CT head: Stable atrophy with periventricular small vessel disease. Prior small infarct in the anterior limb left internal capsule. No acute infarct. No intra-axial mass or hemorrhage.   Bony destruction involving portions of the sphenoid sinus regions, sella, and clivus consistent with known neoplasm this area. Sphenoid sinus mass considerably smaller compared to recent studies. No new bony lesions.   Mastoid air cell disease bilaterally.   CT cervical spine:   1. Known lytic lesion involving a portion of the clivus as well as a portion of the anterior arch of C1 from known nasopharyngeal carcinoma. No other bony destruction involving cervical spine. Bones are osteoporotic.   2.  No acute fracture or spondylolisthesis.   3. 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 No frank disc extrusion or high-grade stenosis.   4.  Foci of carotid artery calcification bilaterally.

## 2021-04-11 NOTE — ED Triage Notes (Signed)
Pt arrives via EMS from [facility]- pt was in a wheelchair but tried to get out and fell- pt fell on his bottom/back- pt complaining of pain mostly in his bottom- pt does take plavix

## 2021-04-11 NOTE — ED Provider Notes (Addendum)
[facility] Emergency Department Provider Note  ____________________________________________   Event Date/Time   First MD Initiated Contact with Patient 04/11/21 1250     (approximate)  I have reviewed the triage vital signs and the nursing notes.   HISTORY  Chief Complaint Fall    HPI Kenneth Franklin is a 75 y.o. male with prior stroke on Plavix who comes in for a fall.  Patient comes in for [facility].  Patient was in a wheelchair but was trying to get out and he fell onto his bottom/back.  Patient reported pain in his bottom.  Patient states that he did not think he hit his hea but his history is limited secondary to dementia.  At this time he is denying any pain anywhere is able to lift both legs up off the bed and pull himself up from laying down to sitting up in the stretcher.  I did call the facility and talked to the nurse who stated that it was unwitnessed fall.  He falls very frequently and that he supposed to be wheelchair-bound but is always trying to get up.  This is his fourth fall this week.  They stated that he they found him laying on the ground and he was complaining of a headache and they were not sure if he hit his head he is also complaining of buttock pain.   Unable to get full HPI due to patient's dementia          Past Medical History:  Diagnosis Date  . Acute ischemic stroke ([place]) 2017  . Alcohol abuse   . Anxiety   . Cancer ([place])   . Depression   . Diabetes mellitus without complication ([place])   . Dyspnea    pcp knows and ordered rescue inhaler  . Hyperlipidemia   . Hypertension   . Skin cancer    Squamous Cell Carcinoma In Situ    Patient Active Problem List   Diagnosis Date Noted  . Protein-calorie malnutrition, severe 03/09/2021  . Palliative care encounter   . Trigeminal neuralgia of right side of face 03/07/2021  . Intractable headache 03/06/2021  . HTN (hypertension) 03/06/2021  . COPD (chronic  obstructive pulmonary disease) ([place]) 03/06/2021  . Diabetes mellitus without complication ([place]) [id number]  . Stroke ([place]) 03/06/2021  . Acute nonintractable headache 02/28/2021  . Xerostomia 12/23/2020  . Constipation 12/03/2020  . Weight loss 12/03/2020  . Malnutrition of moderate degree 10/26/2020  . Painful swallowing 10/25/2020  . Inadequate oral intake 10/25/2020  . Mucositis due to antineoplastic therapy 10/25/2020  . Mucositis due to radiation therapy 10/25/2020  . Odynophagia 10/25/2020  . Dehydration 10/22/2020  . Nausea without vomiting 10/22/2020  . Poor appetite 10/22/2020  . Goals of care, counseling/discussion 09/21/2020  . Hypokalemia 09/15/2020  . 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 Encounter for antineoplastic chemotherapy 09/15/2020  . Hypomagnesemia 09/08/2020  . 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 Chronic obstructive pulmonary disease with acute exacerbation ([place]) 10/01/2019  . Severe recurrent major depression without psychotic features ([place]) 06/01/2017  . Alcohol abuse 06/01/2017  . Tylenol overdose 05/31/2017  . Erroneous encounter - disregard 08/02/2016  . Acute ischemic stroke ([place]) 07/07/2016  . Tobacco use disorder 06/28/2016  . Tick bite of flank 06/16/2016  . Grief counseling 06/16/2016  . Generalized weakness 06/16/2016  . Absent pulse in lower extremity 09/14/2015  . Anxiety and depression 04/21/2015  . Diabetes mellitus type 2, controlled, without complications ([place]) 0000000  . Acid  reflux 04/21/2015  . Calcium blood increased 04/21/2015  . Dyslipidemia 04/21/2015  . HLD (hyperlipidemia) 04/21/2015  . BP (high blood pressure) 04/21/2015    Past Surgical History:  Procedure Laterality Date  . CATARACT EXTRACTION Bilateral   . COLONOSCOPY    . NASOPHARYNGOSCOPY N/A 08/12/2020   Procedure: ENDOSCOPIC NASOPHARYNGOSCOPY WITH BIOPSY;  Surgeon: Margaretha Sheffield, MD;  Location: [facility] ORS;  Service: ENT;  Laterality: N/A;  . PORTA  CATH INSERTION N/A 08/24/2020   Procedure: PORTA CATH INSERTION;  Surgeon: Algernon Huxley, MD;  Location: [facility];  Service: Cardiovascular;  Laterality: N/A;    Prior to Admission medications   Medication Sig Start Date End Date Taking? Authorizing Provider  atorvastatin (LIPITOR) 80 MG tablet TAKE 1 TABLET (80 MG TOTAL) BY MOUTH NIGHTLY. Patient taking differently: Take 80 mg by mouth every evening. 11/14/16   Roselee Nova, MD  carbamazepine (TEGRETOL) 100 MG chewable tablet Chew 1 tablet (100 mg total) by mouth 4 (four) times daily. 03/14/21   [name], MD  clonazePAM (KLONOPIN) 0.25 MG disintegrating tablet Take 1 tablet (0.25 mg total) by mouth 3 (three) times daily. 03/14/21   [name], MD  divalproex (DEPAKOTE ER) 250 MG 24 hr tablet Take 250-750 mg by mouth See admin instructions. Take 1 tablet (250mg ) by mouth at bedtime for 7 nights, then take 2 tablets (500mg ) by mouth at bedtime for 7 nights then take 3 tablets (750mg ) by mouth at bedtime 03/03/21   [provider]  escitalopram (LEXAPRO) 20 MG tablet Take 20 mg by mouth every morning.     [provider]  famotidine (PEPCID) 40 MG tablet Take 1 tablet (40 mg total) by mouth every evening. 02/24/21 02/24/22  Naaman Plummer, MD  fluticasone (FLOVENT HFA) 110 MCG/ACT inhaler Inhale 2 puffs into the lungs daily as needed (shortness of breath).    [provider]  gabapentin (NEURONTIN) 300 MG capsule Take 300-600 mg by mouth See admin instructions. Take 2 capsules (600mg ) by mouth at bedtime for 7 days then take 1 capsule (300mg ) by mouth in the morning and 2 capsules (600mg ) by mouth at night    [provider]  HYDROcodone-acetaminophen (NORCO) 5-325 MG tablet Take 1 tablet by mouth every 6 (six) hours as needed for moderate pain. 02/24/21   Lequita Asal, MD  losartan (COZAAR) 100 MG tablet TAKE 1 TABLET (100 MG TOTAL) BY MOUTH DAILY. Patient taking differently:  Take 100 mg by mouth every morning. 08/16/16   Roselee Nova, MD  methocarbamol (ROBAXIN) 500 MG tablet Take 1 tablet (500 mg total) by mouth every 6 (six) hours as needed for muscle spasms (breakthrough pain). 03/03/21   Vladimir Crofts, MD  mirtazapine (REMERON) 30 MG tablet Take 1 tablet (30 mg total) by mouth at bedtime. Patient taking differently: Take 15 mg by mouth at bedtime as needed. 06/02/17   Fritzi Mandes, MD  piperacillin-tazobactam (ZOSYN) 3.375 GM/50ML IVPB Inject 50 mLs (3.375 g total) into the vein every 8 (eight) hours. 03/14/21   [name], MD  polyethylene glycol (MIRALAX / GLYCOLAX) 17 g packet Take 17 g by mouth daily. 03/14/21   [name], MD  polyvinyl alcohol (LIQUIFILM TEARS) 1.4 % ophthalmic solution Place 1 drop into both eyes as needed for dry eyes. 11/01/20   Bonnell Public Tublu, MD  sodium chloride 0.9 % infusion Inject 111 mLs into the vein continuous. 03/14/21   [name], MD  prochlorperazine (COMPAZINE) 10 MG  tablet Take 1 tablet (10 mg total) by mouth every 6 (six) hours as needed (Nausea or vomiting). 09/08/20 12/28/20  Lequita Asal, MD    Allergies Propoxyphene  Family History  Problem Relation Age of Onset  . Diabetes Brother     Social History Social History   Tobacco Use  . Smoking status: Former Smoker    Packs/day: 1.00    Years: 40.00    Pack years: 40.00    Types: Cigars, Cigarettes    Quit date: 08/12/2019    Years since quitting: 1.6  . Smokeless tobacco: Never Used  Vaping Use  . Vaping Use: Never used  Substance Use Topics  . Alcohol use: Not Currently    Comment: H/O ETOH ABUSE BUT DENIES DRINKING DURING 08-11-20 INTERVIEW  . Drug use: No      Review of Systems History somewhat limited due to dementia but he is denying any symptoms at this time Constitutional: No fever/chills Eyes: No visual changes. ENT: No sore throat. Cardiovascular: Denies chest pain. Respiratory: Denies shortness of  breath. Gastrointestinal: No abdominal pain.  No nausea, no vomiting.  No diarrhea.  No constipation. Genitourinary: Negative for dysuria. Musculoskeletal: Negative for back pain. Skin: Negative for rash. Neurological: Negative for headaches, focal weakness or numbness. All other ROS negative ____________________________________________   PHYSICAL EXAM:  VITAL SIGNS: ED Triage Vitals  Enc Vitals Group     BP 04/11/21 1257 128/61     Pulse Rate 04/11/21 1257 81     Resp 04/11/21 1257 16     Temp 04/11/21 1257 98 F (36.7 C)     Temp Source 04/11/21 1257 Oral     SpO2 04/11/21 1257 97 %     Weight 04/11/21 1255 146 lb 1.6 oz (66.3 kg)     Height 04/11/21 1255 6\' 4"  (1.93 m)     Head Circumference --      [facility]ak Flow --      Pain Score 04/11/21 1255 6     Pain Loc --      Pain Edu? --      Excl. in [place]? --     Constitutional: Alert and oriented. Well appearing and in no acute distress. Eyes: Conjunctivae are normal. EOMI. Head: Atraumatic. Nose: No congestion/rhinnorhea. Mouth/Throat: Mucous membranes are moist.   Neck: No stridor. Trachea Midline. FROM Cardiovascular: Normal rate, regular rhythm. Grossly normal heart sounds.  Good peripheral circulation.  No chest wall tenderness Respiratory: Normal respiratory effort.  No retractions. Lungs CTAB. Gastrointestinal: Soft and nontender. No distention. No abdominal bruits.  G-tube in place Musculoskeletal: No lower extremity tenderness nor edema.  No joint effusions.  No tenderness along [place] extremities.  Able to lift both legs up off the bed.  Good strength in his bilateral arms Neurologic:  Normal speech and language. No gross focal neurologic deficits are appreciated.  Skin:  Skin is warm, dry and intact. No rash noted. Psychiatric: Mood and affect are normal. Speech and behavior are normal. Back: Some old bruising noted on the right flank but patient does not have any tenderness there.  No CTL spine tenderness.  No buttock  tenderness at this time GU: Deferred   ____________________________________________  RADIOLOGY Robert Bellow, personally viewed and evaluated these images (plain radiographs) as part of my medical decision making, as well as reviewing the written report by the radiologist.  ED MD interpretation:  Pending   Official radiology report(s): No results found.    INITIAL IMPRESSION / ASSESSMENT  AND PLAN / ED COURSE  Kenneth Franklin was evaluated in Emergency Department on 04/11/2021 for the symptoms described in the history of present illness. He was evaluated in the context of the global COVID-19 pandemic, which necessitated consideration that the patient might be at risk for infection with the SARS-CoV-2 virus that causes COVID-19. Institutional protocols and algorithms that pertain to the evaluation of patients at risk for COVID-19 are in a state of rapid change based on information released by regulatory bodies including the CDC and federal and state organizations. These policies and algorithms were followed during the patient's care in the ED.    Patient comes in for mechanical fall.  Patient falls frequently secondary to him not being compliant with staying in a wheelchair.  Patient sent over from facility due to being on Plavix and concern he might of hit his head.  Will get CT head to evaluate for intercranial hemorrhage and CT cervical to evaluate for cervical fracture.  Does have some old bruising on his right flank but is not tender anywhere and is otherwise hemodynamically stable.  I have lower suspicion for acute abdominal process or acute cardiac process at this time.  No evidence of extremity fracture on exam.  Patient denies any pain at this time.  Patient has been in the ER for over 2 hours with stable vital signs.  Once CT imaging is negative will discharge patient back to facility.  Incidental findings unchanged from prior.   Will dc home. Able to stand up at bedside.        ____________________________________________   FINAL CLINICAL IMPRESSION(S) / ED DIAGNOSES   Final diagnoses:  Fall, initial encounter      MEDICATIONS GIVEN DURING THIS VISIT:  Medications - No data to display   ED Discharge Orders    None       Note:  This document was prepared using Dragon voice recognition software and may include unintentional dictation errors.   Vanessa Malott, MD 04/11/21 1452    Vanessa Iona, MD 04/11/21 [id number]

## 2021-04-11 NOTE — ED Notes (Signed)
Patient asked to speak to brother Darryl again. Patient on phone with Darryl

## 2021-04-11 NOTE — ED Notes (Signed)
PAtient helped back up in bed. Given warm blanket

## 2021-04-16 ENCOUNTER — Other Ambulatory Visit: Payer: Self-pay

## 2021-04-16 ENCOUNTER — Non-Acute Institutional Stay: Payer: Medicare Other | Admitting: Primary Care

## 2021-04-16 DIAGNOSIS — E43 Unspecified severe protein-calorie malnutrition: Secondary | ICD-10-CM

## 2021-04-16 DIAGNOSIS — Z515 Encounter for palliative care: Secondary | ICD-10-CM

## 2021-04-16 NOTE — Progress Notes (Signed)
[facility] Consult Note Telephone: [phone]  Fax: [phone]    Date of encounter: 04/16/21 PATIENT NAME: Kenneth Franklin [place] [address]   [phone] (home)  DOB: 1946/02/12 MRN: [id number] PRIMARY CARE PROVIDER:    Verita Lamb, NP,  [address] [place] [address] [phone]  REFERRING PROVIDER:   Dr Barbara Cower  RESPONSIBLE PARTY:    Contact Information    Name Relation Home Work Mobile   Boeve,Darryl Brother [phone]  [phone]   Ernestina Patches Daughter [phone]  [phone]   Ean, Gettel   [phone]      I met face to face with patient in the facility. Palliative Care was asked to follow this patient by consultation request of  Dr Barbara Cower to address advance care planning and complex medical decision making. This is the initial visit.                                    ASSESSMENT AND PLAN / RECOMMENDATIONS:   Advance Care Planning/Goals of Care: Goals include to maximize quality of life and symptom management. Our advance care planning conversation included a discussion about:     The value and importance of advance care planning  Exploration of personal, cultural or spiritual beliefs that might influence medical decisions   Exploration of goals of care in the event of a sudden injury or illness   Identification  of a healthcare agent - TBD , a niece  Review  of an advance directive document.  Decision not to resuscitate or to de-escalate disease focused treatments due to poor prognosis.  CODE STATUS: FULL CODE at this time. I will meet with patient on 04/21/21 for family meeting.  Symptom Management/Plan:   Met with patient in his room today. Spoke with family about palliative medicine. Sister in law states he has memory  Loss and is going to Neurology next week. Patient has been doing therapy but inconsistent function. Some days he is able, some days he his  not. He needs prompting for adls. He has won an appeal for more SNF days to continue PT.  We discussed living options for after d/c from SNF. We discussed SNF and ALF, and help at home. Patient has had some falls and being (I) at home is likely not possible at this time.  Discussed PEG feedings and po intake. He may need a trial of decreasing tube feeds to see if he will increase in PO intake.   Follow up Palliative Care Visit: Palliative care will continue to follow for complex medical decision making, advance care planning, and clarification of goals. Return 1 weeks or prn.  I spent 45 minutes providing this consultation. More than 50% of the time in this consultation was spent in counseling and care coordination.  PPS: 30%  HOSPICE ELIGIBILITY/DIAGNOSIS: TBD  Chief Complaint: debility  HISTORY OF PRESENT ILLNESS:  Kenneth Franklin is a 75 y.o. year old male  with h/o sinus cancer, multiple falls, debility, memory loss .   History obtained from review of EMR, discussion with primary team, and interview with family, facility staff/caregiver and/or Mr. Noah.  I reviewed available labs, medications, imaging, studies and related documents from the EMR.  Records reviewed and summarized above.   ROS/staff  General: NAD EYES: denies vision changes ENMT: denies dysphagia Cardiovascular: denies chest pain, denies DOE Pulmonary: denies cough,  denies increased SOB Abdomen: endorses fair appetite, endorses tube feeds, denies constipation, endorses incontinence of bowel GU: denies dysuria, endorses incontinence of urine MSK:  + weakness,  + falls reported Skin: denies rashes or wounds Neurological: endorses pain, denies insomnia Psych: Endorses positive mood Heme/lymph/immuno: denies bruises, abnormal bleeding  Physical Exam: Current and past weights: 146 currently (190's in health) Constitutional: NAD General: frail appearing, thin EYES: anicteric sclera, lids intact, no discharge   ENMT: intact hearing, oral mucous membranes moist CV:  no LE edema Pulmonary: no increased work of breathing, no cough, room air Abdomen: intake 25-50%, no ascites, Peg tube present with erythema around insertion site MSK: moderate sarcopenia, moves all extremities Skin: warm and dry Neuro:  ++ generalized weakness,  ++ cognitive impairment Psych: non-anxious affect, A and O x 1-2 Hem/lymph/immuno: no widespread bruising   CURRENT PROBLEM LIST:  Patient Active Problem List   Diagnosis Date Noted  . Protein-calorie malnutrition, severe 03/09/2021  . Palliative care encounter   . Trigeminal neuralgia of right side of face 03/07/2021  . Intractable headache 03/06/2021  . HTN (hypertension) 03/06/2021  . COPD (chronic obstructive pulmonary disease) ([place]) 03/06/2021  . Diabetes mellitus without complication ([place]) 83/25/4982  . Stroke ([place]) 03/06/2021  . Acute nonintractable headache 02/28/2021  . Xerostomia 12/23/2020  . Constipation 12/03/2020  . Weight loss 12/03/2020  . Malnutrition of moderate degree 10/26/2020  . Painful swallowing 10/25/2020  . Inadequate oral intake 10/25/2020  . Mucositis due to antineoplastic therapy 10/25/2020  . Mucositis due to radiation therapy 10/25/2020  . Odynophagia 10/25/2020  . Dehydration 10/22/2020  . Nausea without vomiting 10/22/2020  . Poor appetite 10/22/2020  . Goals of care, counseling/discussion 09/21/2020  . Hypokalemia 09/15/2020  . 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 Encounter for antineoplastic chemotherapy 09/15/2020  . Hypomagnesemia 09/08/2020  . 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 Chronic obstructive pulmonary disease with acute exacerbation ([place]) 10/01/2019  . Severe recurrent major depression without psychotic features ([place]) 06/01/2017  . Alcohol abuse 06/01/2017  . Tylenol overdose 05/31/2017  . Erroneous encounter - disregard 08/02/2016  . Acute ischemic stroke ([place]) 07/07/2016  . Tobacco use disorder  06/28/2016  . Tick bite of flank 06/16/2016  . Grief counseling 06/16/2016  . Generalized weakness 06/16/2016  . Absent pulse in lower extremity 09/14/2015  . Anxiety and depression 04/21/2015  . Diabetes mellitus type 2, controlled, without complications ([place]) 64/15/8309  . Acid reflux 04/21/2015  . Calcium blood increased 04/21/2015  . Dyslipidemia 04/21/2015  . HLD (hyperlipidemia) 04/21/2015  . BP (high blood pressure) 04/21/2015   PAST MEDICAL HISTORY:  Active Ambulatory Problems    Diagnosis Date Noted  . Anxiety and depression 04/21/2015  . Diabetes mellitus type 2, controlled, without complications ([place]) 40/76/8088  . Acid reflux 04/21/2015  . Calcium blood increased 04/21/2015  . Dyslipidemia 04/21/2015  . HLD (hyperlipidemia) 04/21/2015  . BP (high blood pressure) 04/21/2015  . Absent pulse in lower extremity 09/14/2015  . Tick bite of flank 06/16/2016  . Grief counseling 06/16/2016  . Generalized weakness 06/16/2016  . Acute ischemic stroke ([place]) 07/07/2016  . Erroneous encounter - disregard 08/02/2016  . Tylenol overdose 05/31/2017  . Severe recurrent major depression without psychotic features ([place]) 06/01/2017  . Alcohol abuse 06/01/2017  . 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 Hypomagnesemia 09/08/2020  . Hypokalemia 09/15/2020  . 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 Encounter for antineoplastic chemotherapy 09/15/2020  . Goals of care, counseling/discussion 09/21/2020  . Chronic obstructive pulmonary  disease with acute exacerbation ([place]) 10/01/2019  . Tobacco use disorder 06/28/2016  . Dehydration 10/22/2020  . Nausea without vomiting 10/22/2020  . Poor appetite 10/22/2020  . Painful swallowing 10/25/2020  . Inadequate oral intake 10/25/2020  . Mucositis due to antineoplastic therapy 10/25/2020  . Mucositis due to radiation therapy 10/25/2020  . Odynophagia 10/25/2020  . Malnutrition of moderate degree 10/26/2020  . Constipation 12/03/2020   . Weight loss 12/03/2020  . Xerostomia 12/23/2020  . Acute nonintractable headache 02/28/2021  . Intractable headache 03/06/2021  . HTN (hypertension) 03/06/2021  . COPD (chronic obstructive pulmonary disease) ([place]) 03/06/2021  . Diabetes mellitus without complication ([place]) 46/56/8127  . Stroke ([place]) 03/06/2021  . Trigeminal neuralgia of right side of face 03/07/2021  . Protein-calorie malnutrition, severe 03/09/2021  . Palliative care encounter    Resolved Ambulatory Problems    Diagnosis Date Noted  . Back ache 04/21/2015  . Enterogastritis 04/21/2015   Past Medical History:  Diagnosis Date  . Anxiety   . Cancer ([place])   . Depression   . Dyspnea   . Hyperlipidemia   . Hypertension   . Skin cancer    SOCIAL HX:  Social History   Tobacco Use  . Smoking status: Former Smoker    Packs/day: 1.00    Years: 40.00    Pack years: 40.00    Types: Cigars, Cigarettes    Quit date: 08/12/2019    Years since quitting: 1.6  . Smokeless tobacco: Never Used  Substance Use Topics  . Alcohol use: Not Currently    Comment: H/O ETOH ABUSE BUT DENIES DRINKING DURING 08-11-20 INTERVIEW   FAMILY HX:  Family History  Problem Relation Age of Onset  . Diabetes Brother       ALLERGIES:  Allergies  Allergen Reactions  . Propoxyphene     Unknown reaction     PERTINENT MEDICATIONS:  Outpatient Encounter Medications as of 04/16/2021  Medication Sig  . atorvastatin (LIPITOR) 80 MG tablet TAKE 1 TABLET (80 MG TOTAL) BY MOUTH NIGHTLY. (Patient taking differently: Take 80 mg by mouth every evening.)  . carbamazepine (TEGRETOL) 100 MG chewable tablet Chew 1 tablet (100 mg total) by mouth 4 (four) times daily.  . clonazePAM (KLONOPIN) 0.25 MG disintegrating tablet Take 1 tablet (0.25 mg total) by mouth 3 (three) times daily.  . divalproex (DEPAKOTE ER) 250 MG 24 hr tablet Take 250-750 mg by mouth See admin instructions. Take 1 tablet (218m) by mouth at bedtime for 7 nights, then take 2  tablets (5069m by mouth at bedtime for 7 nights then take 3 tablets (75035mby mouth at bedtime  . escitalopram (LEXAPRO) 20 MG tablet Take 20 mg by mouth every morning.   . famotidine (PEPCID) 40 MG tablet Take 1 tablet (40 mg total) by mouth every evening.  . fluticasone (FLOVENT HFA) 110 MCG/ACT inhaler Inhale 2 puffs into the lungs daily as needed (shortness of breath).  . gabapentin (NEURONTIN) 300 MG capsule Take 300-600 mg by mouth See admin instructions. Take 2 capsules (600m77my mouth at bedtime for 7 days then take 1 capsule (300mg22m mouth in the morning and 2 capsules (600mg)82mmouth at night  . HYDROcodone-acetaminophen (NORCO) 5-325 MG tablet Take 1 tablet by mouth every 6 (six) hours as needed for moderate pain.  . losaMarland Kitchentan (COZAAR) 100 MG tablet TAKE 1 TABLET (100 MG TOTAL) BY MOUTH DAILY. (Patient taking differently: Take 100 mg by mouth every morning.)  . methocarbamol (ROBAXIN) 500 MG tablet Take  1 tablet (500 mg total) by mouth every 6 (six) hours as needed for muscle spasms (breakthrough pain).  . mirtazapine (REMERON) 30 MG tablet Take 1 tablet (30 mg total) by mouth at bedtime. (Patient taking differently: Take 15 mg by mouth at bedtime as needed.)  . piperacillin-tazobactam (ZOSYN) 3.375 GM/50ML IVPB Inject 50 mLs (3.375 g total) into the vein every 8 (eight) hours.  . polyethylene glycol (MIRALAX / GLYCOLAX) 17 g packet Take 17 g by mouth daily.  . polyvinyl alcohol (LIQUIFILM TEARS) 1.4 % ophthalmic solution Place 1 drop into both eyes as needed for dry eyes.  . sodium chloride 0.9 % infusion Inject 111 mLs into the vein continuous.  . [DISCONTINUED] prochlorperazine (COMPAZINE) 10 MG tablet Take 1 tablet (10 mg total) by mouth every 6 (six) hours as needed (Nausea or vomiting).   Facility-Administered Encounter Medications as of 04/16/2021  Medication  . sodium chloride flush (NS) 0.9 % injection 10 mL    Thank you for the opportunity to participate in the care of  Mr. Pry.  The palliative care team will continue to follow. Please call our office at [phone] if we can be of additional assistance.   Jason Coop, NP , DNP, MPH, AGPCNP-BC, ACHPN  COVID-19 PATIENT SCREENING TOOL Asked and negative response unless otherwise noted:   Have you had symptoms of covid, tested positive or been in contact with someone with symptoms/positive test in the past 5-10 days?

## 2021-04-21 ENCOUNTER — Non-Acute Institutional Stay: Payer: Medicare Other | Admitting: Primary Care

## 2021-04-21 ENCOUNTER — Other Ambulatory Visit: Payer: Self-pay

## 2021-04-21 DIAGNOSIS — R531 Weakness: Secondary | ICD-10-CM

## 2021-04-21 DIAGNOSIS — E43 Unspecified severe protein-calorie malnutrition: Secondary | ICD-10-CM

## 2021-04-21 DIAGNOSIS — Z515 Encounter for palliative care: Secondary | ICD-10-CM

## 2021-04-21 DIAGNOSIS — F17200 Nicotine dependence, unspecified, uncomplicated: Secondary | ICD-10-CM

## 2021-04-21 NOTE — Progress Notes (Signed)
[facility] Consult Note Telephone: [phone]  Fax: [phone]    Date of encounter: 04/21/21 PATIENT NAME: Kenneth Franklin [address] [place] [address]   [phone] (home)  DOB: 1946/07/02 MRN: [id number] PRIMARY CARE PROVIDER:    Verita Lamb, NP,  [address] [place] [address] [phone]  REFERRING PROVIDER:   Dr Rica Koyanagi   RESPONSIBLE PARTY:    Contact Information    Name Relation Home Work Mobile   Viverette,Darryl Brother [phone]  [phone]   Ernestina Patches Daughter [phone]  [phone]   Pantelis, Elgersma   [phone]       I met face to face with patient and family in [facility]. Palliative Care was asked to follow this patient by consultation request of  Liz Malady NP,  to address advance care planning and complex medical decision making. This is a follow up visit.                                   ASSESSMENT AND PLAN / RECOMMENDATIONS:   Advance Care Planning/Goals of Care: Goals include to maximize quality of life and symptom management. Our advance care planning conversation included a discussion about:     The value and importance of advance care planning   Exploration of personal, cultural or spiritual beliefs that might influence medical decisions   Exploration of goals of care in the event of a sudden injury or illness   Identification and preparation of a healthcare agent   Review  of an  advance directive document .  CODE STATUS: FULL  POA is Niece [name]. She was not able to attend family meeting today but will be available for discussion on Friday. Discussed goals which are to get better and return home. Discussed current pending appeal for more skilled days at [facility]. Pt voices he wants to go home but needs moderate to max assistance in all adls. We discussed disposition such as home or LTC. We discussed financial matters for paying for care and I have  referred to [facility] lawyer of choice or SNF SW. We discussed home based services ie sitter services, home health, hospice. Family wants to focus on rehab an improving once he gets home but if he is not able to improve, hospice services would seem appropriate.   I spent 25 minutes providing this consultation. More than 50% of the time in this consultation was spent in counseling and care coordination. --------------------------------------------------------------------------------------------------------  Symptom Management/Plan:  Pain: Has head ache pain, saw neuro 2 days ago. Has increased tegretol to 200 mg bid. Plan states this can be increased. Pt has had no headache beginning of meeting but states headache after we spoke. May be fatigue related. Recent MRI states sinus mass has reduced in size and family states he is stable oncologically and having periodic surveillance. Recommend gradual  increase in dose as recommended dosing is 400-800 mg with ceiling of 1.2 gm.   Intake: Has recent wt loss recorded in SNF, discussed with nutritionist  and head nursing staff. They will repeat to verify. Recorded that he is eating 75-100% of meals so he may be ready to wean his tube feedings. He denies dysphagia but states small appetite. He has h/o mirtazipine use but I'd recommend to restart . ECG from 03/08/21 shows nl qt interval. Recent albumin = 3.6. Recommend increase of Prostat to tid.  Follow  up Palliative Care Visit: Palliative care will continue to follow for complex medical decision making, advance care planning, and clarification of goals. Return 1-2 weeks or prn.  PPS: 30%  HOSPICE ELIGIBILITY/DIAGNOSIS: tbd  Chief Complaint: debility, immobility, frequent falls   HISTORY OF PRESENT ILLNESS:  Kenneth Franklin is a 75 y.o. year old male  with h/o ENT cancer now in remission, tobacco abuse, COPD, frequent falls, gait abnormality. He has been in SNF for rehab for 3 weeks, with ongoing, generally,  chronic  mobility deficits in context of chronic pain and osteomyelitis. He has difficulty standing alone and cannot ambulate alone.He has had 3 falls at the SNF.  He can rise from lying to sitting. PT and OT staff state he is making sporadic improvement. Pain is poorly controlled but recently has had a change in protocol.   History obtained from review of EMR, discussion with primary team, and interview with family, facility staff/caregiver and/or Mr. Capizzi.  I reviewed available labs, medications, imaging, studies and related documents from the EMR.  Records reviewed and summarized above.   ROS  General: uncomfortable ENMT: denies dysphagia Cardiovascular: denies chest pain, denies DOE Pulmonary: denies cough, denies increased SOB Abdomen: endorses fair to good  appetite, denies constipation, endorses continence of bowel, has tube feeds GU: denies dysuria, endorses incontinence of urine MSK:  Endorses increased weakness,  3 SNF falls reported Skin: PEG site  Neurological: endorses head ache chronic pain, denies insomnia Psych: Endorses discouraged mood Heme/lymph/immuno: denies bruises, abnormal bleeding  Physical Exam: Current and past weights: 133 lbs noted in chart, 16 lb loss in 1 month (11%). Staff to weigh again. Previous weights 149 lbs. Constitutional: endorses pain General: frail appearing, thin EYES: anicteric sclera, lids intact, no discharge  ENMT: intact hearing, oral mucous membranes moist, dentition intact CV:no LE edema Pulmonary: no increased work of breathing, no cough, room air Abdomen: intake 75%,  no ascites, PEG in L abdomen GU: deferred MSK: severe  sarcopenia, moves all extremities, ambulatory on occasion with walker and assist Skin: warm and dry Neuro:  Moderate to severe  generalized weakness,  Moderate to severe cognitive impairment Psych: anxious affect, A and O x 2 Hem/lymph/immuno: no widespread bruising  Thank you for the opportunity to participate  in the care of Mr. Knippel.  The palliative care team will continue to follow. Please call our office at [phone] if we can be of additional assistance.    This visit was coded based on medical decision making (MDM).   Jason Coop, NP , DNP, MPH, AGPCNP-BC, ACHPN  COVID-19 PATIENT SCREENING TOOL Asked and negative response unless otherwise noted:   Have you had symptoms of covid, tested positive or been in contact with someone with symptoms/positive test in the past 5-10 days?

## 2021-04-30 ENCOUNTER — Emergency Department: Payer: Medicare Other

## 2021-04-30 ENCOUNTER — Other Ambulatory Visit: Payer: Self-pay

## 2021-04-30 ENCOUNTER — Emergency Department
Admission: EM | Admit: 2021-04-30 | Discharge: 2021-04-30 | Disposition: A | Discharge status: Home / Self Care | Payer: Medicare Other | Attending: Emergency Medicine | Admitting: Emergency Medicine

## 2021-04-30 DIAGNOSIS — W01198A Fall on same level from slipping, tripping and stumbling with subsequent striking against other object, initial encounter: Secondary | ICD-10-CM | POA: Diagnosis not present

## 2021-04-30 DIAGNOSIS — S0990XA Unspecified injury of head, initial encounter: Secondary | ICD-10-CM

## 2021-04-30 DIAGNOSIS — W19XXXA Unspecified fall, initial encounter: Secondary | ICD-10-CM

## 2021-04-30 DIAGNOSIS — S0083XA Contusion of other part of head, initial encounter: Secondary | ICD-10-CM | POA: Diagnosis not present

## 2021-04-30 DIAGNOSIS — Y9301 Activity, walking, marching and hiking: Secondary | ICD-10-CM | POA: Diagnosis not present

## 2021-04-30 DIAGNOSIS — E119 Type 2 diabetes mellitus without complications: Secondary | ICD-10-CM | POA: Diagnosis not present

## 2021-04-30 DIAGNOSIS — J441 Chronic obstructive pulmonary disease with (acute) exacerbation: Secondary | ICD-10-CM | POA: Insufficient documentation

## 2021-04-30 DIAGNOSIS — Z85828 Personal history of other malignant neoplasm of skin: Secondary | ICD-10-CM | POA: Insufficient documentation

## 2021-04-30 DIAGNOSIS — I10 Essential (primary) hypertension: Secondary | ICD-10-CM | POA: Diagnosis not present

## 2021-04-30 NOTE — ED Provider Notes (Signed)
[facility] Emergency Department Provider Note   ____________________________________________    I have reviewed the triage vital signs and the nursing notes.   HISTORY  Chief Complaint Fall     HPI Kenneth Franklin is a 75 y.o. male who presents after a fall.  Patient reports he tripped and fell to his side.  He hit his head on the right.  Denies LOC.  No nausea or vomiting.  No chest pain no abdominal pain.  No back pain.  No extremity injuries besides a shallow skin tear to his right elbow.  Denies dizziness or shortness of breath.  Not on blood thinners  Past Medical History:  Diagnosis Date  . Acute ischemic stroke ([place]) 2017  . Alcohol abuse   . Anxiety   . Cancer ([place])   . Depression   . Diabetes mellitus without complication ([place])   . Dyspnea    pcp knows and ordered rescue inhaler  . Hyperlipidemia   . Hypertension   . Skin cancer    Squamous Cell Carcinoma In Situ    Patient Active Problem List   Diagnosis Date Noted  . Protein-calorie malnutrition, severe 03/09/2021  . Palliative care encounter   . Trigeminal neuralgia of right side of face 03/07/2021  . Intractable headache 03/06/2021  . HTN (hypertension) 03/06/2021  . COPD (chronic obstructive pulmonary disease) ([place]) 03/06/2021  . Diabetes mellitus without complication ([place]) 43/15/4008  . Stroke ([place]) 03/06/2021  . Acute nonintractable headache 02/28/2021  . Xerostomia 12/23/2020  . Constipation 12/03/2020  . Weight loss 12/03/2020  . Malnutrition of moderate degree 10/26/2020  . Painful swallowing 10/25/2020  . Inadequate oral intake 10/25/2020  . Mucositis due to antineoplastic therapy 10/25/2020  . Mucositis due to radiation therapy 10/25/2020  . Odynophagia 10/25/2020  . Dehydration 10/22/2020  . Nausea without vomiting 10/22/2020  . Poor appetite 10/22/2020  . Goals of care, counseling/discussion 09/21/2020  . Hypokalemia 09/15/2020  . 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 Encounter for antineoplastic chemotherapy 09/15/2020  . Hypomagnesemia 09/08/2020  . 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 Chronic obstructive pulmonary disease with acute exacerbation ([place]) 10/01/2019  . Severe recurrent major depression without psychotic features ([place]) 06/01/2017  . Alcohol abuse 06/01/2017  . Tylenol overdose 05/31/2017  . Erroneous encounter - disregard 08/02/2016  . Acute ischemic stroke ([place]) 07/07/2016  . Tobacco use disorder 06/28/2016  . Tick bite of flank 06/16/2016  . Grief counseling 06/16/2016  . Generalized weakness 06/16/2016  . Absent pulse in lower extremity 09/14/2015  . Anxiety and depression 04/21/2015  . Diabetes mellitus type 2, controlled, without complications ([place]) 67/61/9509  . Acid reflux 04/21/2015  . Calcium blood increased 04/21/2015  . Dyslipidemia 04/21/2015  . HLD (hyperlipidemia) 04/21/2015  . BP (high blood pressure) 04/21/2015    Past Surgical History:  Procedure Laterality Date  . CATARACT EXTRACTION Bilateral   . COLONOSCOPY    . NASOPHARYNGOSCOPY N/A 08/12/2020   Procedure: ENDOSCOPIC NASOPHARYNGOSCOPY WITH BIOPSY;  Surgeon: Margaretha Sheffield, MD;  Location: [facility] ORS;  Service: ENT;  Laterality: N/A;  . PORTA CATH INSERTION N/A 08/24/2020   Procedure: PORTA CATH INSERTION;  Surgeon: Algernon Huxley, MD;  Location: [facility];  Service: Cardiovascular;  Laterality: N/A;    Prior to Admission medications   Medication Sig Start Date End Date Taking? Authorizing Provider  acetaminophen (TYLENOL) 325 MG tablet Take 975 mg by mouth in the morning, at noon, and at bedtime.  [provider]  Amino Acids-Protein Hydrolys (FEEDING SUPPLEMENT, PRO-STAT SUGAR FREE 64,) LIQD Take 30 mLs by mouth 2 (two) times daily.    [provider]  amLODipine (NORVASC) 5 MG tablet Take 1 tablet by mouth daily. 03/31/21 03/31/22  [provider]  aspirin EC 81 MG tablet Take 81 mg by mouth daily.  Swallow whole.    [provider]  atorvastatin (LIPITOR) 80 MG tablet TAKE 1 TABLET (80 MG TOTAL) BY MOUTH NIGHTLY. Patient not taking: Reported on 04/21/2021 11/14/16   Roselee Nova, MD  carBAMazepine (TEGRETOL) 100 MG/5ML suspension Take 10 mLs by mouth 2 (two) times daily. 03/30/21 03/30/22  [provider]  clonazePAM (KLONOPIN) 0.25 MG disintegrating tablet Take 1 tablet (0.25 mg total) by mouth 3 (three) times daily. Patient not taking: Reported on 04/21/2021 03/14/21   [name], MD  clopidogrel (PLAVIX) 75 MG tablet Take 75 mg by mouth daily.    [provider]  divalproex (DEPAKOTE ER) 250 MG 24 hr tablet Take 250-750 mg by mouth See admin instructions. Take 1 tablet (250mg ) by mouth at bedtime for 7 nights, then take 2 tablets (500mg ) by mouth at bedtime for 7 nights then take 3 tablets (750mg ) by mouth at bedtime 03/03/21   [provider]  escitalopram (LEXAPRO) 20 MG tablet Take 20 mg by mouth every morning.     [provider]  famotidine (PEPCID) 40 MG tablet Take 1 tablet (40 mg total) by mouth every evening. 02/24/21 02/24/22  Naaman Plummer, MD  fluticasone (FLOVENT HFA) 110 MCG/ACT inhaler Inhale 2 puffs into the lungs daily as needed (shortness of breath). Patient not taking: Reported on 04/21/2021    [provider]  folic acid (FOLVITE) 1 MG tablet Take 1 tablet by mouth daily. 03/31/21 03/31/22  [provider]  gabapentin (NEURONTIN) 300 MG capsule Take 300 mg by mouth at bedtime.    [provider]  HYDROcodone-acetaminophen (NORCO) 5-325 MG tablet Take 1 tablet by mouth every 6 (six) hours as needed for moderate pain. Patient not taking: Reported on 04/21/2021 02/24/21   Lequita Asal, MD  losartan (COZAAR) 100 MG tablet TAKE 1 TABLET (100 MG TOTAL) BY MOUTH DAILY. Patient taking differently: Take 100 mg by mouth every morning. 08/16/16   Roselee Nova, MD  methocarbamol (ROBAXIN) 500 MG  tablet Take 1 tablet (500 mg total) by mouth every 6 (six) hours as needed for muscle spasms (breakthrough pain). Patient not taking: Reported on 04/21/2021 03/03/21   Vladimir Crofts, MD  mirtazapine (REMERON) 30 MG tablet Take 1 tablet (30 mg total) by mouth at bedtime. Patient not taking: Reported on 04/21/2021 06/02/17   Fritzi Mandes, MD  Nasal Moisturizer Combination (OCEAN NASAL MOISTURIZER NA) Place 1 spray into the nose every 2 (two) hours. While awake    [provider]  Oxycodone HCl 10 MG TABS Take 10 mg by mouth every 6 (six) hours as needed (pain, moderate to severe).    [provider]  piperacillin-tazobactam (ZOSYN) 3.375 GM/50ML IVPB Inject 50 mLs (3.375 g total) into the vein every 8 (eight) hours. Patient not taking: Reported on 04/21/2021 03/14/21   [name], MD  polyethylene glycol (MIRALAX / GLYCOLAX) 17 g packet Take 17 g by mouth daily. Patient not taking: Reported on 04/21/2021 03/14/21   [name], MD  polyvinyl alcohol (LIQUIFILM TEARS) 1.4 % ophthalmic solution Place 1 drop into both eyes as needed for dry eyes. Patient not taking:  Reported on 04/21/2021 11/01/20   Bonnell Public Tublu, MD  sodium chloride 0.9 % infusion Inject 111 mLs into the vein continuous. Patient not taking: Reported on 04/21/2021 03/14/21   [name], MD  thiamine 100 MG tablet Take 1 tablet by mouth daily. 03/31/21 03/31/22  [provider]  prochlorperazine (COMPAZINE) 10 MG tablet Take 1 tablet (10 mg total) by mouth every 6 (six) hours as needed (Nausea or vomiting). 09/08/20 12/28/20  Lequita Asal, MD     Allergies Propoxyphene  Family History  Problem Relation Age of Onset  . Diabetes Brother     Social History Social History   Tobacco Use  . Smoking status: Former Smoker    Packs/day: 1.00    Years: 40.00    Pack years: 40.00    Types: Cigars, Cigarettes    Quit date: 08/12/2019    Years since quitting: 1.7  . Smokeless  tobacco: Never Used  Vaping Use  . Vaping Use: Never used  Substance Use Topics  . Alcohol use: Not Currently    Comment: H/O ETOH ABUSE BUT DENIES DRINKING DURING 08-11-20 INTERVIEW  . Drug use: No    Review of Systems  Constitutional: No fever/chills Eyes: No visual changes.  ENT: No sore throat. Cardiovascular: Denies chest pain. Respiratory: Denies shortness of breath. Gastrointestinal: No abdominal pain.   Genitourinary: Negative for dysuria. Musculoskeletal: Negative for back pain. Skin: As above Neurological: Negative for headaches   ____________________________________________   PHYSICAL EXAM:  VITAL SIGNS: ED Triage Vitals  Enc Vitals Group     BP 04/30/21 1253 127/77     Pulse Rate 04/30/21 1253 70     Resp 04/30/21 1253 18     Temp 04/30/21 1253 98.4 F (36.9 C)     Temp Source 04/30/21 1253 Oral     SpO2 04/30/21 1253 96 %     Weight --      Height 04/30/21 1251 1.93 m (6\' 4" )     Head Circumference --      [facility] --      Pain Score --      Pain Loc --      Pain Edu? --      Excl. in [place]? --     Constitutional: Alert and oriented. No acute distress. Pleasant and interactive Eyes: Conjunctivae are normal.  Head: Hematoma right forehead, nonbleeding Nose: No congestion/rhinnorhea. Mouth/Throat: Mucous membranes are moist.   Neck:  Painless ROM, no pain with axial load Cardiovascular: Normal rate, regular rhythm.  Good peripheral circulation.  No chest wall tenderness Respiratory: Normal respiratory effort.  No retractions.  Gastrointestinal: Soft and nontender. No distention.    Musculoskeletal: Normal range of motion of all extremities, no pain with axial load on both hips.  Lower extremities warm and well perfused,  no vertebral tenderness palpation Neurologic:  Normal speech and language. No gross focal neurologic deficits are appreciated.  Skin:  Skin is warm, dry.  Small skin tear to the right elbow Psychiatric: Mood and affect are normal.  Speech and behavior are normal.  ____________________________________________   LABS (all labs ordered are listed, but only abnormal results are displayed)  Labs Reviewed - No data to display ____________________________________________  EKG  None ____________________________________________  RADIOLOGY  CT head and cervical spine reviewed by me, no acute abnormalities ____________________________________________   PROCEDURES  Procedure(s) performed: No  Procedures   Critical Care performed: No ____________________________________________   INITIAL IMPRESSION / ASSESSMENT AND PLAN / ED COURSE  Pertinent labs & imaging results that were available during my care of the patient were reviewed by me and considered in my medical decision making (see chart for details).  Patient presents after mechanical fall.  Overall reassuring exam, although significant hematoma to the right forehead.  Pending CT imaging.  CT head and cervical spine are reassuring, patient feels well, no further work-up at this time.  Appropriate discharge    ____________________________________________   FINAL CLINICAL IMPRESSION(S) / ED DIAGNOSES  Final diagnoses:  Injury of head, initial encounter  Fall, initial encounter        Note:  This document was prepared using Dragon voice recognition software and may include unintentional dictation errors.   Lavonia Drafts, MD 04/30/21 1435

## 2021-04-30 NOTE — ED Notes (Signed)
Pt's linen's has been changed, and put into dry paper scrubs due to clothes being wet, by this  Writer and Levada Dy, Therapist, sports.

## 2021-04-30 NOTE — ED Notes (Signed)
Patient at CT scan.

## 2021-04-30 NOTE — ED Triage Notes (Addendum)
See triage note- Pt to ER via [facility] from [facility] where he's staying for rehab. Pt reports losing his balance and falling. Denies LOC. Denies blood thinner usage.   Hematoma present to right side of forehead, pt also reports posterior head pain. Skin tear present to right elbow with dressing in place. No neck tenderness.

## 2021-04-30 NOTE — ED Triage Notes (Signed)
First nurse Note:  Arrives via [facility] from [facility] in [place].  Patient fell while walking back from the bathroom.  Hematoma to forehead.  No LOC.  VS wnl.

## 2021-05-05 ENCOUNTER — Other Ambulatory Visit: Payer: Self-pay

## 2021-05-05 ENCOUNTER — Non-Acute Institutional Stay: Payer: Medicare Other | Admitting: Primary Care

## 2021-05-05 DIAGNOSIS — E43 Unspecified severe protein-calorie malnutrition: Secondary | ICD-10-CM

## 2021-05-05 DIAGNOSIS — R4189 Other symptoms and signs involving cognitive functions and awareness: Secondary | ICD-10-CM | POA: Insufficient documentation

## 2021-05-05 DIAGNOSIS — Z515 Encounter for palliative care: Secondary | ICD-10-CM

## 2021-05-05 DIAGNOSIS — R634 Abnormal weight loss: Secondary | ICD-10-CM

## 2021-05-05 DIAGNOSIS — R630 Anorexia: Secondary | ICD-10-CM

## 2021-05-05 DIAGNOSIS — R296 Repeated falls: Secondary | ICD-10-CM | POA: Insufficient documentation

## 2021-05-05 NOTE — Progress Notes (Signed)
Kenneth [facility] Telephone: [phone]  Fax: [phone]    Date of encounter: 05/05/21 PATIENT NAME: Kenneth Franklin [place] [address]   [phone] (home)  DOB: 19-May-1946 MRN: [id number] PRIMARY CARE PROVIDER:    Verita Lamb, NP,  [address] [place] [address] [phone]  REFERRING PROVIDER:   Rica Koyanagi, MD [address] [place],  [place] [address] [phone]   RESPONSIBLE PARTY:    Contact Information    Name Relation Home Work Mobile   Kenneth Franklin,Kenneth Franklin Brother [phone]  [phone]   Kenneth Franklin Niece   [phone]   Kenneth Franklin Sister [phone]     Kenneth Franklin, Kenneth Franklin Brother   [place] Daughter [phone]  [phone]      Kenneth Franklin. Palliative Care was asked to follow this patient by consultation request of  Kenneth Koyanagi, MD  to address advance care planning and complex medical decision making. This is a follow up visit.                                   ASSESSMENT AND PLAN / RECOMMENDATIONS:   Advance Care Planning/Goals of Care: Goals include to maximize quality of life and symptom management. Our advance care planning conversation included a discussion about:     Exploration of personal, cultural or spiritual beliefs that might influence medical decisions   Identification  of a healthcare agent - Kenneth Franklin  CODE STATUS: Full Code, POA is niece. T/c to Kenneth Franklin, [place], message left for f/u meeting/call for ACP.  Symptom Management/Plan:  Kenneth met with patient in his nursing home room. He was lying across the bed, but said he was comfortable. It was lunch time and he said he had no appetite. He ate no solid food but is receiving tube feedings.   He denies pain although he did have a fall recently. He was seen at ED and cleared for d/c. He sustained bruising on his R face and a skin tear on the  R elbow, which are both healing.   Staff endorses continued appeals by family for continued rehab in the hopes he can go home and be able to live as he did prior. I do plan to discuss other possibilities with his POA as he is not gaining much stamina or weight. Staff states appeal is pending currently.  Follow up Palliative Care Visit: Palliative care will continue to follow for complex medical decision making, advance care planning, and clarification of goals. Return 2-4 weeks or prn.  Kenneth spent 25 minutes providing this consultation. More than 50% of the time in this consultation was spent in counseling and care coordination.  PPS: 30%  HOSPICE ELIGIBILITY/DIAGNOSIS: yes with concordant goals of care  Chief Complaint: weight loss, poor po intake, weakness  HISTORY OF PRESENT ILLNESS:  Kenneth Franklin is a 75 y.o. year old male  with h/o debility, weight loss, FTT, frequent falls .   History obtained from review of EMR, discussion with primary team, and interview with family, [facility] staff/caregiver and/or Kenneth Franklin.  Kenneth reviewed available labs, medications, imaging, studies and related documents from the EMR.  Records reviewed and summarized above.   ROS/staff/family  General: NAD ENMT: denies dysphagia- states no appetite Cardiovascular: denies chest pain, denies DOE Pulmonary: denies cough, denies increased SOB Abdomen: endorses  no appetite, denies constipation, endorses continence of bowel GU: denies dysuria, endorses continence of urine MSK:  Endorses weakness,  + fall reported, went to ED for assessment Skin: family reports skin tear from recent fall Neurological: denies pain, denies insomnia Psych: Endorses positive mood, gets up alone and falls Heme/lymph/immuno: R face bruise from fall  Physical Exam: Current and past weights: 139 lb, few lb gain but BMI 16.9, underweight Constitutional: NAD General: frail appearing, thin EYES: anicteric sclera, lids intact, no discharge   ENMT: intact hearing, oral mucous membranes moist CV: S1S2, RRR, no LE edema Pulmonary: LCTA, no increased work of breathing, no cough, room air Abdomen: intake 25-50%, normo-active BS + 4 quadrants, soft and non tender, no ascites, tube feeds per peg tube GU: deferred MSK: severe sarcopenia, moves all extremities, ambulatory with assistance, frequent falls Skin: warm and dry Neuro:  severe generalized weakness,  severe cognitive impairment Psych: non-anxious affect, A and O x 3 Hem/lymph/immuno: no widespread bruising  Thank you for the opportunity to participate in the care of Kenneth Franklin.  The palliative care team will continue to follow. Please call our office at [phone] if we can be of additional assistance.   Kenneth Coop, NP , DNP, MPH, AGPCNP-BC, ACHPN  COVID-19 PATIENT SCREENING TOOL Asked and negative response unless otherwise noted:   Have you had symptoms of covid, tested positive or been in contact with someone with symptoms/positive test in the past 5-10 days?

## 2021-05-06 ENCOUNTER — Ambulatory Visit: Payer: Medicare Other | Admitting: Radiation Oncology

## 2021-05-12 ENCOUNTER — Other Ambulatory Visit: Payer: Self-pay

## 2021-05-12 ENCOUNTER — Non-Acute Institutional Stay: Payer: Medicare Other | Admitting: Primary Care

## 2021-05-12 DIAGNOSIS — U071 COVID-19: Secondary | ICD-10-CM | POA: Insufficient documentation

## 2021-05-12 DIAGNOSIS — R4189 Other symptoms and signs involving cognitive functions and awareness: Secondary | ICD-10-CM

## 2021-05-12 DIAGNOSIS — E43 Unspecified severe protein-calorie malnutrition: Secondary | ICD-10-CM

## 2021-05-12 DIAGNOSIS — Z515 Encounter for palliative care: Secondary | ICD-10-CM

## 2021-05-12 DIAGNOSIS — R531 Weakness: Secondary | ICD-10-CM

## 2021-05-12 NOTE — Progress Notes (Addendum)
[facility] Consult Note Telephone: [phone]  Fax: [phone]    Date of encounter: 05/12/21 PATIENT NAME: Kenneth Franklin [address] [place] [address]   [phone] (home)  DOB: 1946/11/05 MRN: [id number] PRIMARY CARE PROVIDER:    Verita Lamb, NP,  [address] [place] [address] [phone]  REFERRING PROVIDER:   Rica Koyanagi, MD [address] [place],  [place] [address] [phone]  RESPONSIBLE PARTY:    Contact Information     Name Relation Home Work Mobile   Macneill,Darryl Brother [phone]  [phone]   [name] Niece   [phone]   Gerarda Gunther Sister [phone]     [place], [place] Brother   [place] Daughter [phone]  [phone]        I met face to face with patient in [facility]. Palliative Care was asked to follow this patient by consultation request of  Rica Koyanagi, MD to address advance care planning and complex medical decision making. This is a follow up visit.                                   ASSESSMENT AND PLAN / RECOMMENDATIONS:   Advance Care Planning/Goals of Care: Goals include to maximize quality of life and symptom management. CODE STATUS: FULL CODE  Symptom Management/Plan:  I met with patient in his nursing home room. He has newly diagnosed Covid. He states he's not feeling very well and is indeed much more subdued than our last visit. He's not exhibiting gross  respiratory symptoms. His intake has not been good for some time and he has lost 10 lbs, 7% in a month, and 11 % since 1/22. Goals have been for him to return to independent living but this Covid setback will likely delay that.   Follow up Palliative Care Visit: Palliative care will continue to follow for complex medical decision making, advance care planning, and clarification of goals. Return 2-4 weeks or prn.  I spent 15 minutes providing this consultation. More than 50%  of the time in this consultation was spent in counseling and care coordination.  PPS: 30%  HOSPICE ELIGIBILITY/DIAGNOSIS: TBD  Chief Complaint: fatigue, covid 19 infection  HISTORY OF PRESENT ILLNESS:  Kenneth [facility] is a 75 y.o. year old male  with covid 19 + test, weakness, ongoing weight loss. He has lost 10 lbs in a month. He states he does not feel good with covid and states he moved rooms  and is still getting used to this.   History obtained from review of EMR, discussion with primary team, and interview with family, facility staff/caregiver and/or Kenneth Franklin.  I reviewed available labs, medications, imaging, studies and related documents from the EMR.  Records reviewed and summarized above.   ROS/staff   General: NAD Cardiovascular:  denies DOE Pulmonary: denies cough, denies increased SOB Abdomen: endorses poor appetite, denies constipation, endorses incontinence of bowels GU: denies dysuria, endorses incontinence of urine MSK:  endorses weakness,  no falls reported today Skin: denies rashes or wounds Neurological: denies pain, denies insomnia Psych: Endorses flat mood Heme/lymph/immuno: denies bruises, abnormal bleeding  Physical Exam: Current and past weights: 139 lbs, 10 lb loss in 1 month, 7% Constitutional: NAD General: frail appearing, thin  EYES: anicteric sclera, lids intact, no discharge  ENMT: intact hearing, oral mucous membranes moist, dentition intact Pulmonary:  no increased work of  breathing, no cough, room air Abdomen: intake 50-75%, varied by meal, no ascites GU: deferred MSK: severe sarcopenia, moves all extremities, non ambulatory Skin: warm and dry, no rashes or wounds on visible skin Neuro:  + generalized weakness,  + cognitive impairment Psych: anxious affect, A and O x 1-2 Hem/lymph/immuno: no widespread bruising   Thank you for the opportunity to participate in the care of Kenneth Franklin.  The palliative care team will continue to follow. Please  call our office at [phone] if we can be of additional assistance.   Jason Coop, NP , DNP, MPH, AGPCNP-BC, ACHPN  COVID-19 PATIENT SCREENING TOOL Asked and positive:   Have you had symptoms of covid, tested positive or been in contact with someone with symptoms/positive test in the past 5-10 days?

## 2021-05-20 ENCOUNTER — Emergency Department
Admission: EM | Admit: 2021-05-20 | Discharge: 2021-05-21 | Disposition: A | Discharge status: Home / Self Care | Payer: Medicare Other | Attending: Emergency Medicine | Admitting: Emergency Medicine

## 2021-05-20 ENCOUNTER — Emergency Department: Payer: Medicare Other

## 2021-05-20 DIAGNOSIS — S0990XA Unspecified injury of head, initial encounter: Secondary | ICD-10-CM | POA: Insufficient documentation

## 2021-05-20 DIAGNOSIS — S01112A Laceration without foreign body of left eyelid and periocular area, initial encounter: Secondary | ICD-10-CM | POA: Diagnosis not present

## 2021-05-20 DIAGNOSIS — Z7902 Long term (current) use of antithrombotics/antiplatelets: Secondary | ICD-10-CM | POA: Diagnosis not present

## 2021-05-20 DIAGNOSIS — S0592XA Unspecified injury of left eye and orbit, initial encounter: Secondary | ICD-10-CM | POA: Diagnosis present

## 2021-05-20 DIAGNOSIS — I10 Essential (primary) hypertension: Secondary | ICD-10-CM | POA: Insufficient documentation

## 2021-05-20 DIAGNOSIS — Z85818 Personal history of malignant neoplasm of other sites of lip, oral cavity, and pharynx: Secondary | ICD-10-CM | POA: Diagnosis not present

## 2021-05-20 DIAGNOSIS — S0181XA Laceration without foreign body of other part of head, initial encounter: Secondary | ICD-10-CM

## 2021-05-20 DIAGNOSIS — Y92129 Unspecified place in nursing home as the place of occurrence of the external cause: Secondary | ICD-10-CM | POA: Insufficient documentation

## 2021-05-20 DIAGNOSIS — J449 Chronic obstructive pulmonary disease, unspecified: Secondary | ICD-10-CM | POA: Insufficient documentation

## 2021-05-20 DIAGNOSIS — Z8616 Personal history of COVID-19: Secondary | ICD-10-CM | POA: Insufficient documentation

## 2021-05-20 DIAGNOSIS — E1136 Type 2 diabetes mellitus with diabetic cataract: Secondary | ICD-10-CM | POA: Diagnosis not present

## 2021-05-20 DIAGNOSIS — Z86008 Personal history of in-situ neoplasm of other site: Secondary | ICD-10-CM | POA: Insufficient documentation

## 2021-05-20 DIAGNOSIS — Z9221 Personal history of antineoplastic chemotherapy: Secondary | ICD-10-CM | POA: Insufficient documentation

## 2021-05-20 DIAGNOSIS — Z923 Personal history of irradiation: Secondary | ICD-10-CM | POA: Insufficient documentation

## 2021-05-20 DIAGNOSIS — W108XXA Fall (on) (from) other stairs and steps, initial encounter: Secondary | ICD-10-CM | POA: Insufficient documentation

## 2021-05-20 DIAGNOSIS — W19XXXA Unspecified fall, initial encounter: Secondary | ICD-10-CM

## 2021-05-20 DIAGNOSIS — Z87891 Personal history of nicotine dependence: Secondary | ICD-10-CM | POA: Diagnosis not present

## 2021-05-20 NOTE — ED Triage Notes (Signed)
Ll from couch, left forehead abrasion , small lac, cough , no thinners, no loc per pt

## 2021-05-20 NOTE — ED Notes (Signed)
[facility]  CALLED  FOR  TRANSPORT  TO  [facility] 

## 2021-05-20 NOTE — ED Provider Notes (Signed)
[facility] Emergency Department Provider Note   ____________________________________________    I have reviewed the triage vital signs and the nursing notes.   HISTORY  Chief Complaint Fall    HPI Kenneth Franklin is a 75 y.o. male with a history as noted below who presents from nursing home after a fall secondary to a misstep while getting off the couch.  He is not on blood thinners.  He complains only of mild injury above his left eye.  Denies hip pain back pain abdominal pain.  No dizziness.  No neurodeficits.  Past Medical History:  Diagnosis Date   Acute ischemic stroke ([place]) 2017   Alcohol abuse    Anxiety    Cancer ([place])    Depression    Diabetes mellitus without complication ([place])    Dyspnea    pcp knows and ordered rescue inhaler   Hyperlipidemia    Hypertension    Skin cancer    Squamous Cell Carcinoma In Situ    Patient Active Problem List   Diagnosis Date Noted   COVID-19 05/12/2021   Frequent falls 05/05/2021   Cognitive impairment 05/05/2021   Protein-calorie malnutrition, severe 03/09/2021   Palliative care encounter    Trigeminal neuralgia of right side of face 03/07/2021   Intractable headache 03/06/2021   HTN (hypertension) 03/06/2021   COPD (chronic obstructive pulmonary disease) ([place]) 03/06/2021   Diabetes mellitus without complication ([place]) 62/83/1517   Stroke ([place]) 03/06/2021   Acute nonintractable headache 02/28/2021   Xerostomia 12/23/2020   Constipation 12/03/2020   Weight loss 12/03/2020   Malnutrition of moderate degree 10/26/2020   Painful swallowing 10/25/2020   Inadequate oral intake 10/25/2020   Mucositis due to antineoplastic therapy 10/25/2020   Mucositis due to radiation therapy 10/25/2020   Odynophagia 10/25/2020   Dehydration 10/22/2020   Nausea without vomiting 10/22/2020   Poor appetite 10/22/2020   Goals of care, counseling/discussion 09/21/2020   Hypokalemia 09/15/2020   High frequency  hearing loss 09/15/2020   Encounter for antineoplastic chemotherapy 09/15/2020   Hypomagnesemia 09/08/2020   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   Chronic obstructive pulmonary disease with acute exacerbation ([place]) 10/01/2019   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   Alcohol abuse 06/01/2017   Tylenol overdose 05/31/2017   Erroneous encounter - disregard 08/02/2016   Acute ischemic stroke ([place]) 07/07/2016   Tobacco use disorder 06/28/2016   Tick bite of flank 06/16/2016   Grief counseling 06/16/2016   Generalized weakness 06/16/2016   Absent pulse in lower extremity 09/14/2015   Anxiety and depression 04/21/2015   Diabetes mellitus type 2, controlled, without complications ([place]) 61/60/7371   Acid reflux 04/21/2015   Calcium blood increased 04/21/2015   Dyslipidemia 04/21/2015   HLD (hyperlipidemia) 04/21/2015   BP (high blood pressure) 04/21/2015    Past Surgical History:  Procedure Laterality Date   CATARACT EXTRACTION Bilateral    COLONOSCOPY     NASOPHARYNGOSCOPY N/A 08/12/2020   Procedure: ENDOSCOPIC NASOPHARYNGOSCOPY WITH BIOPSY;  Surgeon: Margaretha Sheffield, MD;  Location: [facility] ORS;  Service: ENT;  Laterality: N/A;   PORTA CATH INSERTION N/A 08/24/2020   Procedure: PORTA CATH INSERTION;  Surgeon: Algernon Huxley, MD;  Location: [facility];  Service: Cardiovascular;  Laterality: N/A;    Prior to Admission medications   Medication Sig Start Date End Date Taking? Authorizing Provider  acetaminophen (TYLENOL) 325 MG tablet Take 975 mg by mouth in the morning, at noon, and at bedtime.  [provider]  Amino Acids-Protein Hydrolys (FEEDING SUPPLEMENT, PRO-STAT SUGAR FREE 64,) LIQD Take 30 mLs by mouth 2 (two) times daily.    [provider]  amLODipine (NORVASC) 5 MG tablet Take 1 tablet by mouth daily. 03/31/21 03/31/22  [provider]  aspirin EC 81 MG tablet Take 81 mg by mouth daily. Swallow whole.     [provider]  atorvastatin (LIPITOR) 80 MG tablet TAKE 1 TABLET (80 MG TOTAL) BY MOUTH NIGHTLY. Patient not taking: Reported on 04/21/2021 11/14/16   Roselee Nova, MD  carBAMazepine (TEGRETOL) 100 MG/5ML suspension Take 10 mLs by mouth 2 (two) times daily. 03/30/21 03/30/22  [provider]  clonazePAM (KLONOPIN) 0.25 MG disintegrating tablet Take 1 tablet (0.25 mg total) by mouth 3 (three) times daily. Patient not taking: Reported on 04/21/2021 03/14/21   [name], MD  clopidogrel (PLAVIX) 75 MG tablet Take 75 mg by mouth daily.    [provider]  divalproex (DEPAKOTE ER) 250 MG 24 hr tablet Take 250-750 mg by mouth See admin instructions. Take 1 tablet (250mg ) by mouth at bedtime for 7 nights, then take 2 tablets (500mg ) by mouth at bedtime for 7 nights then take 3 tablets (750mg ) by mouth at bedtime 03/03/21   [provider]  escitalopram (LEXAPRO) 20 MG tablet Take 20 mg by mouth every morning.     [provider]  famotidine (PEPCID) 40 MG tablet Take 1 tablet (40 mg total) by mouth every evening. 02/24/21 02/24/22  Naaman Plummer, MD  fluticasone (FLOVENT HFA) 110 MCG/ACT inhaler Inhale 2 puffs into the lungs daily as needed (shortness of breath). Patient not taking: Reported on 04/21/2021    [provider]  folic acid (FOLVITE) 1 MG tablet Take 1 tablet by mouth daily. 03/31/21 03/31/22  [provider]  gabapentin (NEURONTIN) 300 MG capsule Take 300 mg by mouth at bedtime.    [provider]  HYDROcodone-acetaminophen (NORCO) 5-325 MG tablet Take 1 tablet by mouth every 6 (six) hours as needed for moderate pain. Patient not taking: Reported on 04/21/2021 02/24/21   Lequita Asal, MD  losartan (COZAAR) 100 MG tablet TAKE 1 TABLET (100 MG TOTAL) BY MOUTH DAILY. Patient taking differently: Take 100 mg by mouth every morning. 08/16/16   Roselee Nova, MD  methocarbamol (ROBAXIN) 500 MG tablet Take 1 tablet  (500 mg total) by mouth every 6 (six) hours as needed for muscle spasms (breakthrough pain). Patient not taking: Reported on 04/21/2021 03/03/21   Vladimir Crofts, MD  mirtazapine (REMERON) 30 MG tablet Take 1 tablet (30 mg total) by mouth at bedtime. Patient not taking: Reported on 04/21/2021 06/02/17   Fritzi Mandes, MD  Nasal Moisturizer Combination (OCEAN NASAL MOISTURIZER NA) Place 1 spray into the nose every 2 (two) hours. While awake    [provider]  Oxycodone HCl 10 MG TABS Take 10 mg by mouth every 6 (six) hours as needed (pain, moderate to severe).    [provider]  piperacillin-tazobactam (ZOSYN) 3.375 GM/50ML IVPB Inject 50 mLs (3.375 g total) into the vein every 8 (eight) hours. Patient not taking: Reported on 04/21/2021 03/14/21   [name], MD  polyethylene glycol (MIRALAX / GLYCOLAX) 17 g packet Take 17 g by mouth daily. Patient not taking: Reported on 04/21/2021 03/14/21   [name], MD  polyvinyl alcohol (LIQUIFILM TEARS) 1.4 % ophthalmic solution Place 1 drop into both eyes as needed for dry eyes. Patient not taking:  Reported on 04/21/2021 11/01/20   Bonnell Public Tublu, MD  sodium chloride 0.9 % infusion Inject 111 mLs into the vein continuous. Patient not taking: Reported on 04/21/2021 03/14/21   [name], MD  thiamine 100 MG tablet Take 1 tablet by mouth daily. 03/31/21 03/31/22  [provider]  prochlorperazine (COMPAZINE) 10 MG tablet Take 1 tablet (10 mg total) by mouth every 6 (six) hours as needed (Nausea or vomiting). 09/08/20 12/28/20  Lequita Asal, MD     Allergies Propoxyphene  Family History  Problem Relation Age of Onset   Diabetes Brother     Social History Social History   Tobacco Use   Smoking status: Former    Packs/day: 1.00    Years: 40.00    Pack years: 40.00    Types: 53, Cigarettes    Quit date: 08/12/2019    Years since quitting: 1.7   Smokeless tobacco: Never  Vaping Use    Vaping Use: Never used  Substance Use Topics   Alcohol use: Not Currently    Comment: H/O ETOH ABUSE BUT DENIES DRINKING DURING 08-11-20 INTERVIEW   Drug use: No    Review of Systems  Constitutional: No dizziness Eyes: No visual changes.  ENT: No nasal injury Cardiovascular: Denies chest pain. Respiratory: Denies shortness of breath. Gastrointestinal: No abdominal pain.  Genitourinary: Negative for groin injury Musculoskeletal: Negative for back pain.  No hip pain Skin: Small laceration above the left eye Neurological: Negative for headaches or weakness   ____________________________________________   PHYSICAL EXAM:  VITAL SIGNS: ED Triage Vitals  Enc Vitals Group     BP 05/20/21 1058 123/74     Pulse Rate 05/20/21 1058 87     Resp --      Temp 05/20/21 1058 98.8 F (37.1 C)     Temp Source 05/20/21 1058 Oral     SpO2 05/20/21 1058 99 %     Weight --      Height --      Head Circumference --      Peak Flow --      Pain Score 05/20/21 1059 0     Pain Loc --      Pain Edu? --      Excl. in [place]? --     Constitutional: Alert  No acute distress. Pleasant and interactive Eyes: Conjunctivae are normal.  Head: 1 cm laceration above the left eye, bleeding controlled, no foreign body  Mouth/Throat: Mucous membranes are moist.   Neck:  Painless ROM, no pain with axial load, no vertebral tenderness palpation Cardiovascular: Normal rate, regular rhythm. Grossly normal heart sounds.  Good peripheral circulation.  No chest wall tenderness to palpation Respiratory: Normal respiratory effort.  No retractions. Gastrointestinal: Soft and nontender. No distention.  No CVA tenderness.  Musculoskeletal: No lower extremity tenderness nor edema.  Warm and well perfused, normal range of motion of all extremities, no pain with axial load on both hips.  No vertebral tenderness palpation Neurologic:  Normal speech and language. No gross focal neurologic deficits are appreciated.  Skin:   Skin is warm, dry, see above Psychiatric: Mood and affect are normal. Speech and behavior are normal.  ____________________________________________   LABS (all labs ordered are listed, but only abnormal results are displayed)  Labs Reviewed - No data to display ____________________________________________  EKG   ____________________________________________  RADIOLOGY  CT head reviewed by me ____________________________________________   PROCEDURES  Procedure(s) performed: yes  .Marland KitchenLaceration Repair  Date/Time: 05/20/2021 11:20 AM Performed  by: Lavonia Drafts, MD Authorized by: Lavonia Drafts, MD   Consent:    Consent obtained:  Verbal   Consent given by:  Patient   Risks discussed:  Pain and infection Anesthesia:    Anesthesia method:  None Laceration details:    Location:  Face   Face location:  L eyebrow   Length (cm):  1 Exploration:    Wound exploration: wound explored through full range of motion     Wound extent: no vascular damage noted   Treatment:    Area cleansed with:  Saline   Amount of cleaning:  Standard Skin repair:    Repair method:  Tissue adhesive Approximation:    Approximation:  Close Repair type:    Repair type:  Simple Post-procedure details:    Dressing:  Open (no dressing)   Critical Care performed: No ____________________________________________   INITIAL IMPRESSION / ASSESSMENT AND PLAN / ED COURSE  Pertinent labs & imaging results that were available during my care of the patient were reviewed by me and considered in my medical decision making (see chart for details).   Patient presents for mechanical fall, suffered small laceration of the left eyebrow, repaired with skin glue.  Patient not on any blood thinners, possibly on Plavix will obtain CT head    ____________________________________________   FINAL CLINICAL IMPRESSION(S) / ED DIAGNOSES  Final diagnoses:  Fall, initial encounter  Facial laceration, initial  encounter        Note:  This document was prepared using Dragon voice recognition software and may include unintentional dictation errors.    Lavonia Drafts, MD 05/20/21 1122

## 2021-05-28 ENCOUNTER — Other Ambulatory Visit: Payer: Self-pay

## 2021-05-28 ENCOUNTER — Emergency Department: Payer: Medicare Other

## 2021-05-28 ENCOUNTER — Emergency Department
Admission: EM | Admit: 2021-05-28 | Discharge: 2021-05-28 | Disposition: A | Discharge status: Home / Self Care | Payer: Medicare Other | Attending: Emergency Medicine | Admitting: Emergency Medicine

## 2021-05-28 DIAGNOSIS — W19XXXA Unspecified fall, initial encounter: Secondary | ICD-10-CM

## 2021-05-28 DIAGNOSIS — Z8616 Personal history of COVID-19: Secondary | ICD-10-CM | POA: Insufficient documentation

## 2021-05-28 DIAGNOSIS — S8002XA Contusion of left knee, initial encounter: Secondary | ICD-10-CM | POA: Insufficient documentation

## 2021-05-28 DIAGNOSIS — Z7982 Long term (current) use of aspirin: Secondary | ICD-10-CM | POA: Diagnosis not present

## 2021-05-28 DIAGNOSIS — Z853 Personal history of malignant neoplasm of breast: Secondary | ICD-10-CM | POA: Insufficient documentation

## 2021-05-28 DIAGNOSIS — S0083XA Contusion of other part of head, initial encounter: Secondary | ICD-10-CM | POA: Diagnosis not present

## 2021-05-28 DIAGNOSIS — Z79899 Other long term (current) drug therapy: Secondary | ICD-10-CM | POA: Insufficient documentation

## 2021-05-28 DIAGNOSIS — J449 Chronic obstructive pulmonary disease, unspecified: Secondary | ICD-10-CM | POA: Diagnosis not present

## 2021-05-28 DIAGNOSIS — Y9384 Activity, sleeping: Secondary | ICD-10-CM | POA: Diagnosis not present

## 2021-05-28 DIAGNOSIS — E119 Type 2 diabetes mellitus without complications: Secondary | ICD-10-CM | POA: Insufficient documentation

## 2021-05-28 DIAGNOSIS — Z87891 Personal history of nicotine dependence: Secondary | ICD-10-CM | POA: Diagnosis not present

## 2021-05-28 DIAGNOSIS — W06XXXA Fall from bed, initial encounter: Secondary | ICD-10-CM | POA: Diagnosis not present

## 2021-05-28 DIAGNOSIS — I10 Essential (primary) hypertension: Secondary | ICD-10-CM | POA: Diagnosis not present

## 2021-05-28 DIAGNOSIS — S8992XA Unspecified injury of left lower leg, initial encounter: Secondary | ICD-10-CM | POA: Diagnosis present

## 2021-05-28 DIAGNOSIS — Z7902 Long term (current) use of antithrombotics/antiplatelets: Secondary | ICD-10-CM | POA: Diagnosis not present

## 2021-05-28 LAB — BASIC METABOLIC PANEL
Anion gap: 9 (ref 5–15)
BUN: 14 mg/dL (ref 8–23)
CO2: 31 mmol/L (ref 22–32)
Calcium: 9.6 mg/dL (ref 8.9–10.3)
Chloride: 95 mmol/L — ABNORMAL LOW (ref 98–111)
Creatinine, Ser: 0.6 mg/dL — ABNORMAL LOW (ref 0.61–1.24)
GFR, Estimated: >60 mL/min (ref >60)
Glucose, Bld: 142 mg/dL — ABNORMAL HIGH (ref 70–99)
Potassium: 4.1 mmol/L (ref 3.5–5.1)
Sodium: 135 mmol/L (ref 135–145)

## 2021-05-28 LAB — CBC
HCT: 39.8 % (ref 39.0–52.0)
Hemoglobin: 13 g/dL (ref 13.0–17.0)
MCH: 30.2 pg (ref 26.0–34.0)
MCHC: 32.7 g/dL (ref 30.0–36.0)
MCV: 92.3 fL (ref 80.0–100.0)
Platelets: 368 10*3/uL (ref 150–400)
RBC: 4.31 MIL/uL (ref 4.22–5.81)
RDW: 13.7 % (ref 11.5–15.5)
WBC: 6.1 10*3/uL (ref 4.0–10.5)
nRBC: 0 % (ref 0.0–0.2)

## 2021-05-28 NOTE — ED Notes (Signed)
[facility]  CALLED  FOR  TRANSPORT  TO  [facility] 

## 2021-05-28 NOTE — ED Provider Notes (Signed)
[name] Department Provider Note   ____________________________________________   Event Date/Time   First MD Initiated Contact with Patient 05/28/21 [phone]     (approximate)  I have reviewed the triage vital signs and the nursing notes.   HISTORY  Chief Complaint Fall    HPI Kenneth Franklin is a 75 y.o. male with past medical history as listed below  Reports he was dreaming, during his dream he was rolling on his side and he rolled out of the bed falling onto the floor.  Denies he was injured except for some slight amount of soreness and slight swelling below his right eye, also a little bit of an abrasion of the left knee but denies any knee pain or actual injury.  Reports he feels fine does not think he needs any sort of an x-ray  No neck pain.  Does fall frequently, seen recently in the hospital about a week ago for similar  No pain or discomfort anywhere.  Ports area of his eyes is slightly sore if he pushes on it.  Does not wish for any pain medicine  Past Medical History:  Diagnosis Date   Acute ischemic stroke ([place]) 2017   Alcohol abuse    Anxiety    Cancer ([place])    Depression    Diabetes mellitus without complication ([place])    Dyspnea    pcp knows and ordered rescue inhaler   Hyperlipidemia    Hypertension    Skin cancer    Squamous Cell Carcinoma In Situ    Patient Active Problem List   Diagnosis Date Noted   COVID-19 05/12/2021   Frequent falls 05/05/2021   Cognitive impairment 05/05/2021   Protein-calorie malnutrition, severe 03/09/2021   Palliative care encounter    Trigeminal neuralgia of right side of face 03/07/2021   Intractable headache 03/06/2021   HTN (hypertension) 03/06/2021   COPD (chronic obstructive pulmonary disease) ([employer]) 03/06/2021   Diabetes mellitus without complication ([place]) 30/86/5784   Stroke ([place]) 03/06/2021   Acute nonintractable headache 02/28/2021   Xerostomia 12/23/2020   Constipation  12/03/2020   Weight loss 12/03/2020   Malnutrition of moderate degree 10/26/2020   Painful swallowing 10/25/2020   Inadequate oral intake 10/25/2020   Mucositis due to antineoplastic therapy 10/25/2020   Mucositis due to radiation therapy 10/25/2020   Odynophagia 10/25/2020   Dehydration 10/22/2020   Nausea without vomiting 10/22/2020   Poor appetite 10/22/2020   Goals of care, counseling/discussion 09/21/2020   Hypokalemia 09/15/2020   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   Encounter for antineoplastic chemotherapy 09/15/2020   Hypomagnesemia 09/08/2020   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   Chronic obstructive pulmonary disease with acute exacerbation ([place]) 10/01/2019   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/01/2017   Alcohol abuse 06/01/2017   Tylenol overdose 05/31/2017   Erroneous encounter - disregard 08/02/2016   Acute ischemic stroke ([place]) 07/07/2016   Tobacco use disorder 06/28/2016   Tick bite of flank 06/16/2016   Grief counseling 06/16/2016   Generalized weakness 06/16/2016   Absent pulse in lower extremity 09/14/2015   Anxiety and depression 04/21/2015   Diabetes mellitus type 2, controlled, without complications ([place]) 69/62/9528   Acid reflux 04/21/2015   Calcium blood increased 04/21/2015   Dyslipidemia 04/21/2015   HLD (hyperlipidemia) 04/21/2015   BP (high blood pressure) 04/21/2015    Past Surgical History:  Procedure Laterality Date   CATARACT EXTRACTION Bilateral    COLONOSCOPY     NASOPHARYNGOSCOPY N/A  08/12/2020   Procedure: ENDOSCOPIC NASOPHARYNGOSCOPY WITH BIOPSY;  Surgeon: Margaretha Sheffield, MD;  Location: [facility] ORS;  Service: ENT;  Laterality: N/A;   PORTA CATH INSERTION N/A 08/24/2020   Procedure: PORTA CATH INSERTION;  Surgeon: Algernon Huxley, MD;  Location: [facility];  Service: Cardiovascular;  Laterality: N/A;    Prior to Admission medications   Medication Sig Start Date End Date Taking?  Authorizing Provider  acetaminophen (TYLENOL) 325 MG tablet Take 975 mg by mouth in the morning, at noon, and at bedtime.    [provider]  Amino Acids-Protein Hydrolys (FEEDING SUPPLEMENT, PRO-STAT SUGAR FREE 64,) LIQD Take 30 mLs by mouth 2 (two) times daily.    [provider]  amLODipine (NORVASC) 5 MG tablet Take 1 tablet by mouth daily. 03/31/21 03/31/22  [provider]  aspirin EC 81 MG tablet Take 81 mg by mouth daily. Swallow whole.    [provider]  atorvastatin (LIPITOR) 80 MG tablet TAKE 1 TABLET (80 MG TOTAL) BY MOUTH NIGHTLY. Patient not taking: Reported on 04/21/2021 11/14/16   Roselee Nova, MD  carBAMazepine (TEGRETOL) 100 MG/5ML suspension Take 10 mLs by mouth 2 (two) times daily. 03/30/21 03/30/22  [provider]  clonazePAM (KLONOPIN) 0.25 MG disintegrating tablet Take 1 tablet (0.25 mg total) by mouth 3 (three) times daily. Patient not taking: Reported on 04/21/2021 03/14/21   [name], MD  clopidogrel (PLAVIX) 75 MG tablet Take 75 mg by mouth daily.    [provider]  divalproex (DEPAKOTE ER) 250 MG 24 hr tablet Take 250-750 mg by mouth See admin instructions. Take 1 tablet (250mg ) by mouth at bedtime for 7 nights, then take 2 tablets (500mg ) by mouth at bedtime for 7 nights then take 3 tablets (750mg ) by mouth at bedtime 03/03/21   [provider]  escitalopram (LEXAPRO) 20 MG tablet Take 20 mg by mouth every morning.     [provider]  famotidine (PEPCID) 40 MG tablet Take 1 tablet (40 mg total) by mouth every evening. 02/24/21 02/24/22  Naaman Plummer, MD  fluticasone (FLOVENT HFA) 110 MCG/ACT inhaler Inhale 2 puffs into the lungs daily as needed (shortness of breath). Patient not taking: Reported on 04/21/2021    [provider]  folic acid (FOLVITE) 1 MG tablet Take 1 tablet by mouth daily. 03/31/21 03/31/22  [provider]  gabapentin (NEURONTIN) 300 MG capsule Take 300 mg by  mouth at bedtime.    [provider]  HYDROcodone-acetaminophen (NORCO) 5-325 MG tablet Take 1 tablet by mouth every 6 (six) hours as needed for moderate pain. Patient not taking: Reported on 04/21/2021 02/24/21   Lequita Asal, MD  losartan (COZAAR) 100 MG tablet TAKE 1 TABLET (100 MG TOTAL) BY MOUTH DAILY. Patient taking differently: Take 100 mg by mouth every morning. 08/16/16   Roselee Nova, MD  methocarbamol (ROBAXIN) 500 MG tablet Take 1 tablet (500 mg total) by mouth every 6 (six) hours as needed for muscle spasms (breakthrough pain). Patient not taking: Reported on 04/21/2021 03/03/21   Vladimir Crofts, MD  mirtazapine (REMERON) 30 MG tablet Take 1 tablet (30 mg total) by mouth at bedtime. Patient not taking: Reported on 04/21/2021 06/02/17   Fritzi Mandes, MD  Nasal Moisturizer Combination (OCEAN NASAL MOISTURIZER NA) Place 1 spray into the nose every 2 (two) hours. While awake    [provider]  Oxycodone HCl 10 MG TABS Take 10 mg by mouth every 6 (six)  hours as needed (pain, moderate to severe).    [provider]  piperacillin-tazobactam (ZOSYN) 3.375 GM/50ML IVPB Inject 50 mLs (3.375 g total) into the vein every 8 (eight) hours. Patient not taking: Reported on 04/21/2021 03/14/21   [name], MD  polyethylene glycol (MIRALAX / GLYCOLAX) 17 g packet Take 17 g by mouth daily. Patient not taking: Reported on 04/21/2021 03/14/21   [name], MD  polyvinyl alcohol (LIQUIFILM TEARS) 1.4 % ophthalmic solution Place 1 drop into both eyes as needed for dry eyes. Patient not taking: Reported on 04/21/2021 11/01/20   Bonnell Public Tublu, MD  sodium chloride 0.9 % infusion Inject 111 mLs into the vein continuous. Patient not taking: Reported on 04/21/2021 03/14/21   [name], MD  thiamine 100 MG tablet Take 1 tablet by mouth daily. 03/31/21 03/31/22  [provider]  prochlorperazine (COMPAZINE) 10 MG tablet Take 1 tablet (10 mg  total) by mouth every 6 (six) hours as needed (Nausea or vomiting). 09/08/20 12/28/20  Lequita Asal, MD    Allergies Propoxyphene  Family History  Problem Relation Age of Onset   Diabetes Brother     Social History Social History   Tobacco Use   Smoking status: Former    Packs/day: 1.00    Years: 40.00    Pack years: 40.00    Types: 27, Cigarettes    Quit date: 08/12/2019    Years since quitting: 1.7   Smokeless tobacco: Never  Vaping Use   Vaping Use: Never used  Substance Use Topics   Alcohol use: Not Currently    Comment: H/O ETOH ABUSE BUT DENIES DRINKING DURING 08-11-20 INTERVIEW   Drug use: No    Review of Systems Constitutional: No fever/chills Eyes: No visual changes. ENT: No sore throat.  No neck pain Cardiovascular: Denies chest pain. Respiratory: Denies shortness of breath. Gastrointestinal: No abdominal pain.   Genitourinary: Negative for dysuria. Musculoskeletal: Negative for back pain.  Small abrasion left knee Skin: Negative for rash. Neurological: Negative for headaches, areas of focal weakness or numbness.    ____________________________________________   PHYSICAL EXAM:  VITAL SIGNS: ED Triage Vitals  Enc Vitals Group     BP 05/28/21 0838 (!) 147/70     Pulse Rate 05/28/21 0838 76     Resp 05/28/21 0838 16     Temp 05/28/21 0838 98.1 F (36.7 C)     Temp Source 05/28/21 0838 Oral     SpO2 05/28/21 0835 97 %     Weight 05/28/21 0837 135 lb (61.2 kg)     Height 05/28/21 0837 6' (1.829 m)     Head Circumference --      [facility] --      Pain Score 05/28/21 0835 0     Pain Loc --      Pain Edu? --      Excl. in [place]? --     Constitutional: Alert and oriented. Well appearing and in no acute distress.  Somewhat frail in appearance but pleasant calm and oriented. Eyes: Conjunctivae are normal. Head: Atraumatic except for very small amount of contusion minimal induration of the right medial superior orbital rim. Nose: No  congestion/rhinnorhea. Mouth/Throat: Mucous membranes are moist. Neck: No stridor.  No cervical tenderness Cardiovascular: Normal rate, regular rhythm. Grossly normal heart sounds.  Good peripheral circulation. Respiratory: Normal respiratory effort.  No retractions. Lungs CTAB. Gastrointestinal: Soft and nontender. No distention.  Feeding tube left upper quadrant clean dry intact. Musculoskeletal:   RIGHT  Right upper extremity demonstrates normal strength, good use of all muscles. No edema bruising or contusions of the right shoulder/upper arm, right elbow, right forearm / hand. Full range of motion of the right right upper extremity without pain. No evidence of trauma. Strong radial pulse. Intact median/ulnar/radial neuro-muscular exam.  LEFT Left upper extremity demonstrates normal strength, good use of all muscles. No edema bruising or contusions of the left shoulder/upper arm, left elbow, left forearm / hand. Full range of motion of the left  upper extremity without pain. No evidence of trauma. Strong radial pulse. Intact median/ulnar/radial neuro-muscular exam.  Lower Extremities  No edema. Normal DP/PT pulses bilateral with good cap refill.  Normal neuro-motor function lower extremities bilateral.  RIGHT Right lower extremity demonstrates normal strength, good use of all muscles. No edema bruising or contusions of the right hip, right knee, right ankle. Full range of motion of the right lower extremity without pain. No pain on axial loading. No evidence of trauma.  LEFT Left lower extremity demonstrates normal strength, good use of all muscles. No edema bruising or contusions of the hip,  knee, ankle. Full range of motion of the left lower extremity without pain. No pain on axial loading. No evidence of trauma except a small approximately dime size area of induration with slight overlying abrasion over the medial portion of the proximal tibial shaft.  Denies that it is painful, suspect  is a small hematoma.  There is no deformity.  Patient has notable arthritic changes of bilateral knee joints but no joint effusions.  Neurologic:  Normal speech and language. No gross focal neurologic deficits are appreciated.  Skin:  Skin is warm, dry and intact. No rash noted. Psychiatric: Mood and affect are normal. Speech and behavior are normal.  ____________________________________________   LABS (all labs ordered are listed, but only abnormal results are displayed)  Labs Reviewed  BASIC METABOLIC PANEL - Abnormal; Notable for the following components:      Result Value   Chloride 95 (*)    Glucose, Bld 142 (*)    Creatinine, Ser 0.60 (*)    All other components within normal limits  CBC   ____________________________________________  EKG  Reviewed interpreted at 850 Heart rate 75 QRS 99 QTc 440 Normal sinus rhythm no evidence of ischemia. ____________________________________________  RADIOLOGY  CT Head Wo Contrast  Result Date: 05/28/2021 CLINICAL DATA:  Fall.  Facial trauma. EXAM: CT HEAD WITHOUT CONTRAST CT CERVICAL SPINE WITHOUT CONTRAST TECHNIQUE: Multidetector CT imaging of the head and cervical spine was performed following the standard protocol without intravenous contrast. Multiplanar CT image reconstructions of the cervical spine were also generated. COMPARISON:  04/30/2021 FINDINGS: CT HEAD FINDINGS Brain: No evidence of acute infarction, hemorrhage, hydrocephalus, extra-axial collection or mass lesion/mass effect. Periventricular white matter hypodensity is a nonspecific finding, but most commonly relates to chronic ischemic small vessel disease. Diffuse cortical atrophy. Vascular: No hyperdense vessel or unexpected calcification. Skull: Lucencies seen throughout the clivus similar to prior examination. Sinuses/Orbits: Postsurgical changes of the sphenoid sinuses is noted. There is partial opacification of the paranasal sinuses and mastoid air cells. Other: Small  right supraorbital hematoma. CT CERVICAL SPINE FINDINGS Alignment: Within normal limits. Skull base and vertebrae: Destructive changes of the clivus again noted. Soft tissues and spinal canal: No acute abnormality of the prevertebral soft tissues are visualized lung apices. Disc levels: Degenerative changes seen throughout the cervical spine. Upper chest: Negative. Other: None. IMPRESSION: 1. No acute intracranial abnormality. 2. Erosive changes of the  clivus again seen. 3. No acute fracture or dislocation of the cervical or visualized upper thoracic spine. Electronically Signed   By: Miachel Roux M.D.   On: 05/28/2021 10:28   CT Cervical Spine Wo Contrast  Result Date: 05/28/2021 CLINICAL DATA:  Fall.  Facial trauma. EXAM: CT HEAD WITHOUT CONTRAST CT CERVICAL SPINE WITHOUT CONTRAST TECHNIQUE: Multidetector CT imaging of the head and cervical spine was performed following the standard protocol without intravenous contrast. Multiplanar CT image reconstructions of the cervical spine were also generated. COMPARISON:  04/30/2021 FINDINGS: CT HEAD FINDINGS Brain: No evidence of acute infarction, hemorrhage, hydrocephalus, extra-axial collection or mass lesion/mass effect. Periventricular white matter hypodensity is a nonspecific finding, but most commonly relates to chronic ischemic small vessel disease. Diffuse cortical atrophy. Vascular: No hyperdense vessel or unexpected calcification. Skull: Lucencies seen throughout the clivus similar to prior examination. Sinuses/Orbits: Postsurgical changes of the sphenoid sinuses is noted. There is partial opacification of the paranasal sinuses and mastoid air cells. Other: Small right supraorbital hematoma. CT CERVICAL SPINE FINDINGS Alignment: Within normal limits. Skull base and vertebrae: Destructive changes of the clivus again noted. Soft tissues and spinal canal: No acute abnormality of the prevertebral soft tissues are visualized lung apices. Disc levels: Degenerative  changes seen throughout the cervical spine. Upper chest: Negative. Other: None. IMPRESSION: 1. No acute intracranial abnormality. 2. Erosive changes of the clivus again seen. 3. No acute fracture or dislocation of the cervical or visualized upper thoracic spine. Electronically Signed   By: Miachel Roux M.D.   On: 05/28/2021 10:28     CT imaging of the head and neck negative for acute finding. ____________________________________________   PROCEDURES  Procedure(s) performed: None  Procedures  Critical Care performed: No  ____________________________________________   INITIAL IMPRESSION / ASSESSMENT AND PLAN / ED COURSE  Pertinent labs & imaging results that were available during my care of the patient were reviewed by me and considered in my medical decision making (see chart for details).   Fall.  Likely mechanical in nature reportedly rolled out of bed while dreaming.  He does have evidence of very small head injury with slight induration over the right eye orbit.  Otherwise a small abrasion with minimal suspected hematoma left distal knee but good range of motion no pain neurovascularly intact.  Because he is on a feeding tube and reports they have to supplement his feeds and he has had some frequent falls recently, will check basic labs evaluate for signs of dehydration anemia etc.  Overall awake alert oriented without distress or pain.  If his imaging studies including CT to exclude intracranial injury given his age are negative and labs are reassuring anticipate return to his care facility.  ----------------------------------------- 10:34 AM on 05/28/2021 ----------------------------------------- Is consistent with as reported to be mechanical in nature per patient.  At this point, resting comfortably without distress.  Will discharge  Patient denies any acute complaints or concerns.  Comfortable with plan for discharge back to his care facility  Return precautions and  treatment recommendations and follow-up discussed with the patient who is agreeable with the plan.        ____________________________________________   FINAL CLINICAL IMPRESSION(S) / ED DIAGNOSES  Final diagnoses:  Fall, initial encounter  Contusion of face, initial encounter  Contusion of left knee, initial encounter        Note:  This document was prepared using Systems analyst and may include unintentional dictation errors       Delman Kitten,  MD 05/28/21 1035

## 2021-05-28 NOTE — ED Notes (Signed)
ED Provider at bedside. 

## 2021-05-28 NOTE — ED Triage Notes (Signed)
Pt BIBA from [facility] for a fall. Pt had an unwitnessed fall today x 55min ago. Pt denies LOC and pain on arrival. Pt has hematoma above right eye and left knee. Normal ROM in all extremities. Vitals stable. Pt reports taking ASA daily. A+Ox4 on arrival. NAD noted.

## 2021-05-28 NOTE — ED Notes (Signed)
Report called to Charlie at [facility]. Documented Care Handoff.

## 2021-06-10 ENCOUNTER — Telehealth: Payer: Self-pay | Admitting: Primary Care

## 2021-06-10 NOTE — Telephone Encounter (Signed)
Spoke with patient's daughter, Ernestina Patches, and have scheduled a Palliative f/u visit (post discharge from [facility]) for 07/13/21 @ 12:30 PM

## 2021-07-13 ENCOUNTER — Other Ambulatory Visit: Payer: Self-pay

## 2021-07-13 ENCOUNTER — Other Ambulatory Visit: Payer: Medicare Other | Admitting: Primary Care

## 2021-07-13 DIAGNOSIS — C119 Malignant neoplasm of nasopharynx, unspecified: Secondary | ICD-10-CM

## 2021-07-13 DIAGNOSIS — E440 Moderate protein-calorie malnutrition: Secondary | ICD-10-CM

## 2021-07-13 DIAGNOSIS — R634 Abnormal weight loss: Secondary | ICD-10-CM

## 2021-07-13 DIAGNOSIS — Z515 Encounter for palliative care: Secondary | ICD-10-CM

## 2021-07-13 DIAGNOSIS — R296 Repeated falls: Secondary | ICD-10-CM

## 2021-07-13 DIAGNOSIS — J449 Chronic obstructive pulmonary disease, unspecified: Secondary | ICD-10-CM

## 2021-07-13 NOTE — Progress Notes (Signed)
  [facility] Consult Note Telephone: [phone]  Fax: [phone]    Date of encounter: 07/13/21 PATIENT NAME: Kenneth Franklin [address] [place] [address]   [phone] (home)  DOB: 06/23/1946 MRN: [id number] PRIMARY CARE PROVIDER:    MacDonald, Keri, NP,  [address] [place] [address] [phone]  REFERRING PROVIDER:   MacDonald, Keri, NP [address] [place],  [place] [address] [phone]  RESPONSIBLE PARTY:    Contact Information     Name Relation Home Work Mobile   Franklin,Kenneth (POA) Daughter [phone]  [phone]   Franklin,Kenneth Brother [phone]  [phone]   Franklin,Kenneth Niece   [phone]   Franklin,Kenneth Brother   [phone]        I met face to face with patient and  caregiver  in home. Palliative Care was asked to follow this patient by consultation request of  MacDonald, Keri, NP to address advance care planning and complex medical decision making. This is a follow up visit.                                   ASSESSMENT AND PLAN / RECOMMENDATIONS:   Advance Care Planning/Goals of Care: Goals include to maximize quality of life and symptom management. Our advance care planning conversation included a discussion about:    The value and importance of advance care planning  Experiences with loved ones who have been seriously ill or have died - outlines wife's death 4 years ago he states Exploration of personal, cultural or spiritual beliefs that might influence medical decisions  Exploration of goals of care in the event of a sudden injury or illness  Identification of a healthcare agent =niece Kenneth Franklin Review and updating of an  advance directive document Discussed MOST, he has no memory of doing so in [facility]. We discussed his goals and qol issues. He states he does not want the feeding tube anymore but said he would want to discuss on case by case vs. None. He is seeking d/c of feeding tube  now. CODE STATUS: FULL CODE  I spent 20 minutes providing this consultation. More than 50% of the time in this consultation was spent in counseling and care coordination.  --------------------------------------------------------------------------------------------------------------  Symptom Management/Plan:  Patient home from SNF stay where he was quite ill. He states he is now better, and eating again. He endorses gaining weight and he does appear WNWD compared to his status during SNF stay and post hospital.  Nutrition: He states he is eating well and gaining. He is at least stable per his report. PCG endorses good appetite. He would like tube out, and states this is his wish even if he was not eating well. I have reached out to oncology for f/u to assess and coordinate D/c. Please weigh at next visit in office. Albumin on file 3.7, increased over past months from 2.5.  Memory/delirium: Appears improved. States he does not remember stay at SNF or some things from now that he's home. He does fell he's improving with his memory but has caregivers during the day when family works.   Oncology f/u: I have reached out to have him see his next provider after his oncologist left. He is also concerned about his Port. He does not remember when it was accessed and does not appear to have had treatments for some time. It's not apparent if it was done at   SNF. It may be failed due to no maintenance, and he again would like it out. Hence, he will consult with oncology to see what the plan of care is including any necessary imaging. I reviewed recent head CT's which did not show trauma from falls, and cites known sinus carcinoma.  Medications: Should be reviewed with POA who as not available today. Will coordinate a visit with her or plan an uplink during next visit.  There is a care giver in the home to assist with adls.  Follow up Palliative Care Visit: Palliative care will continue to follow for complex  medical decision making, advance care planning, and clarification of goals. Return 4 weeks or prn.  PPS: 50%  HOSPICE ELIGIBILITY/DIAGNOSIS: TBD  Chief Complaint: debility  HISTORY OF PRESENT ILLNESS:  Kenneth Franklin is a 75 y.o. year old male  with h/o cancer of the head and neck, confusion, frequent falls. Today he states he is feeling better at home. He discusses his po intake which is improved. He still has the pEG but is taking those feedings PO. He would like PEG removed. He also states POC which may not have been flushed in some time, having been lost to follow up while in SNF. He would like it out too, and I will coordinate appt with oncology to discuss. He endorses eating better but wishes it was more, using his walker although he still has some falls. He has caregivers at home.  History obtained from review of EMR, discussion with primary team, and interview with family, facility staff/caregiver and/or [name].  I reviewed available labs, medications, imaging, studies and related documents from the EMR.  Records reviewed and summarized above.   ROS  General: NAD EYES: denies vision changes ENMT: denies dysphagia Cardiovascular: denies chest pain, denies DOE Pulmonary: denies cough, denies increased SOB Abdomen: endorses good appetite, denies constipation, endorses continence of bowel GU: denies dysuria, endorses continence of urine MSK:  endorses weakness,  several falls reported Skin: denies rashes or wounds Neurological: denies pain, denies insomnia Psych: Endorses positive mood Heme/lymph/immuno: denies bruises, abnormal bleeding  Physical Exam: Current and past weights: 135 lb on record, need wt at next appt Constitutional: NAD General: frail appearing, thin EYES: anicteric sclera, lids intact, no discharge  ENMT: intact hearing, oral mucous membranes moist, dentition intact CV:  no LE edema Pulmonary:  no increased work of breathing, no cough, room air Abdomen:  intake 50%, no ascites GU: deferred MSK: moderate sarcopenia, moves all extremities, ambulatory with walker, falls risk Skin: warm and dry, no rashes or wounds on visible skin Neuro:  + generalized weakness,  moderate cognitive impairment Psych: non-anxious affect, A and O x 2-3 Hem/lymph/immuno: no widespread bruising   This visit was coded based on medical decision making (MDM).  Thank you for the opportunity to participate in the care of Mr. Pruden.  The palliative care team will continue to follow. Please call our office at [phone] if we can be of additional assistance.   Jason Coop, NP   COVID-19 PATIENT SCREENING TOOL Asked and negative response unless otherwise noted:   Have you had symptoms of covid, tested positive or been in contact with someone with symptoms/positive test in the past 5-10 days?

## 2021-07-16 ENCOUNTER — Encounter: Payer: Self-pay | Admitting: Nurse Practitioner

## 2021-07-16 ENCOUNTER — Telehealth (INDEPENDENT_AMBULATORY_CARE_PROVIDER_SITE_OTHER): Payer: Self-pay

## 2021-07-16 ENCOUNTER — Other Ambulatory Visit: Payer: Self-pay

## 2021-07-16 ENCOUNTER — Encounter (INDEPENDENT_AMBULATORY_CARE_PROVIDER_SITE_OTHER): Payer: Self-pay

## 2021-07-16 ENCOUNTER — Inpatient Hospital Stay: Payer: Medicare Other | Attending: Oncology | Admitting: Nurse Practitioner

## 2021-07-16 ENCOUNTER — Telehealth: Payer: Self-pay

## 2021-07-16 VITALS — BP 128/74 | HR 87 | Temp 96.7°F | Resp 16 | Wt 137.5 lb

## 2021-07-16 DIAGNOSIS — H903 Sensorineural hearing loss, bilateral: Secondary | ICD-10-CM | POA: Diagnosis not present

## 2021-07-16 DIAGNOSIS — R519 Headache, unspecified: Secondary | ICD-10-CM | POA: Diagnosis not present

## 2021-07-16 DIAGNOSIS — C119 Malignant neoplasm of nasopharynx, unspecified: Secondary | ICD-10-CM

## 2021-07-16 DIAGNOSIS — Z79899 Other long term (current) drug therapy: Secondary | ICD-10-CM | POA: Insufficient documentation

## 2021-07-16 DIAGNOSIS — Z95828 Presence of other vascular implants and grafts: Secondary | ICD-10-CM | POA: Diagnosis not present

## 2021-07-16 DIAGNOSIS — Z931 Gastrostomy status: Secondary | ICD-10-CM

## 2021-07-16 NOTE — Telephone Encounter (Signed)
Patient has been schedule for port removal on 07/22/21 with Dr Lucky Cowboy arrival time 6:45 am at [facility]. Pre-procedure instructions were gone over with patient daughter and letter will be mailed out.

## 2021-07-16 NOTE — Progress Notes (Signed)
[facility]  [address] [place], [place] [address] Phone: [phone]  Fax: [phone]   Clinic Day: 07/16/21  Referring physician: Verita Lamb, NP  Chief Complaint: Kenneth Franklin is a 75 y.o. male with stage III nasopharyngeal carcinoma who is seen for review of interval PET scan and discussion regarding direction of therapy.  HPI: Kenneth Franklin is a 75 y.o. 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  Pathology revealed keratinizing squamous cell carcinoma, moderately differentiated.   Head MRI on 08/13/2020 revealed a 5.9 cm nasopharyngeal mass consistent with known nasopharyngeal carcinoma with involvement of the clivus and sphenoid sinus. There was moderate chronic small vessel ischemic disease and cerebral atrophy.  There was no intracranial extension.  PET scan on 08/18/2020 revealed a 5.7 x 4.2 cm destructive hypermetabolic mass (SUV [id number]) involving the sphenoid sinus, skull base and posterior nasopharynx.  This was consistent with patient's known squamous cell carcinoma. There was no locoregional lymphadenopathy or distant metastatic disease.  He received IMRT from 09/08/2020 - 10/21/2020.  He received 5 weeks of cisplatin (09/08/2020 - 09/29/2020; 10/13/2020 - 10/20/2020).  He did not receive treatment on 10/06/2020 secondary to poor urine output.  Otolaryngologic exam on 12/22/2020 revealed left nasal cavity septal deviation, convex, airway obstruction 75-100%.  EBV was negative on 01/28/2021.   PET scan on 02/22/2021 revealed interval decrease in size and degree of FDG uptake associated with the FDG avid mass involving the sphenoid sinus, posterior nasopharynx and skull base. There were no signs of locoregional lymphadenopathy or distant metastatic disease. Increased tracer uptake was identified within the esophagus. Correlate for any clinical signs or symptoms of esophagitis.  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 Audiogram on 12/22/2020 was stable. Hearing aid evaluation was recommended.  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 Hearing aid evaluation was recommended.  He was admitted to [facility] from 10/25/2020 - 11/01/2020 with mucositis.  He was treated with Magic mouthwash, viscous lidocaine, IV morphine and Toradol.   Interval History: Patient returns to clinic for evaluation and consideration of removal of port-a-cath and peg tube.  He has a history of nasopharyngeal cancer on 9/21.  Plan was for concurrent chemo and radiation followed by adjuvant chemotherapy.  Patient did not receive adjuvant chemotherapy.  PET scan in March showed decreased size and FDG uptake of primary mass.  He began having severe headaches and was eventually transferred to [facility].  He was diagnosed with bilateral pyogenic ethmoid sinusitis which was drained on 4/18.  He was treated with multiple antibiotics and antifungals.  Pathology from 03/15/2021 biopsy showed atypical cells but negative for active malignancy.  No signs of active tumor were seen.  PEG tube was placed due to malnutrition.  [facility]. He's had several falls. Some concern was raised about dementia at both rehab and during hospitalization.   Recently he feels that he is doing better. Eating and drinking normally. Not taking anything via peg. Swallowing normally. Port hasn't been used or flushed in several months. He'd like to have it removed. Daughter accompanies him today and confirms history. Says he is doing well at home. No falls. Seems to be getting stronger. No more recent headaches.    Past Medical History:  Diagnosis Date   Acute ischemic stroke ([facility]) 2017  Alcohol abuse    Anxiety    Cancer ([facility])    Depression     Diabetes mellitus without complication ([facility])    Dyspnea    pcp knows and ordered rescue inhaler   Hyperlipidemia    Hypertension    Skin cancer    Squamous Cell Carcinoma In Situ    Past Surgical History:  Procedure Laterality Date   CATARACT EXTRACTION Bilateral    COLONOSCOPY     NASOPHARYNGOSCOPY N/A 08/12/2020   Procedure: ENDOSCOPIC NASOPHARYNGOSCOPY WITH BIOPSY;  Surgeon: Margaretha Sheffield, MD;  Location: [facility] ORS;  Service: ENT;  Laterality: N/A;   PORTA CATH INSERTION N/A 08/24/2020   Procedure: PORTA CATH INSERTION;  Surgeon: Algernon Huxley, MD;  Location: [facility];  Service: Cardiovascular;  Laterality: N/A;    Family History  Problem Relation Age of Onset   Diabetes Brother     Social History:  reports that he quit smoking about 23 months ago. His smoking use included cigars and cigarettes. He has a 40.00 pack-year smoking history. He has never used smokeless tobacco. He reports that he does not currently use alcohol. He reports that he does not use drugs. He smoked 1 pack/day x 40 years.  He denies exposure of radiation or toxins; he did work with asphalt. He lives alone in [place]. He is widowed. Ernestina Patches (step daughter) can be reached at [phone]. The patient is accompanied by a family member today.  Allergies:  Allergies  Allergen Reactions   Propoxyphene     Unknown reaction    Current Medications: Current Outpatient Medications  Medication Sig Dispense Refill   acetaminophen (TYLENOL) 325 MG tablet Take 975 mg by mouth in the morning, at noon, and at bedtime.     albuterol (PROVENTIL) (2.5 MG/3ML) 0.083% nebulizer solution PLEASE SEE ATTACHED FOR DETAILED DIRECTIONS     Amino Acids-Protein Hydrolys (FEEDING SUPPLEMENT, PRO-STAT SUGAR FREE 64,) LIQD Take 30 mLs by mouth 2 (two) times daily.     amLODipine (NORVASC) 5 MG tablet Take 1 tablet by mouth daily.     aspirin EC 81 MG tablet Take 81 mg by mouth daily. Swallow whole.     carBAMazepine  (TEGRETOL) 100 MG/5ML suspension Take 10 mLs by mouth 2 (two) times daily.     clopidogrel (PLAVIX) 75 MG tablet Take by mouth.     escitalopram (LEXAPRO) 20 MG tablet Take 20 mg by mouth every morning.      famotidine (PEPCID) 40 MG tablet Take by mouth.     folic acid (FOLVITE) 1 MG tablet Take 1 tablet by mouth daily.     gabapentin (NEURONTIN) 300 MG capsule Take by mouth.     losartan (COZAAR) 50 MG tablet Take 50 mg by mouth daily.     ondansetron (ZOFRAN-ODT) 4 MG disintegrating tablet Take by mouth.     Oxycodone HCl 10 MG TABS Take 10 mg by mouth every 6 (six) hours as needed (pain, moderate to severe).     polyvinyl alcohol (LIQUIFILM TEARS) 1.4 % ophthalmic solution Apply to eye.     sodium chloride (OCEAN) 0.65 % nasal spray Place into the nose.     SUMAtriptan (IMITREX) 25 MG tablet Take by mouth.     thiamine 100 MG tablet Take 1 tablet by mouth daily.     atorvastatin (LIPITOR) 80 MG tablet TAKE 1 TABLET (80 MG TOTAL) BY MOUTH NIGHTLY. (Patient not taking: No sig reported) 90 tablet 0   clonazePAM (KLONOPIN)  0.25 MG disintegrating tablet Take 1 tablet (0.25 mg total) by mouth 3 (three) times daily. (Patient not taking: No sig reported) 60 tablet 0   divalproex (DEPAKOTE ER) 250 MG 24 hr tablet Take 250-750 mg by mouth See admin instructions. Take 1 tablet ('250mg'$ ) by mouth at bedtime for 7 nights, then take 2 tablets ('500mg'$ ) by mouth at bedtime for 7 nights then take 3 tablets ('750mg'$ ) by mouth at bedtime (Patient not taking: Reported on 07/16/2021)     fluticasone (FLOVENT HFA) 110 MCG/ACT inhaler Inhale 2 puffs into the lungs daily as needed (shortness of breath). (Patient not taking: No sig reported)     glipiZIDE (GLUCOTROL) 5 MG tablet Take 1 tablet by mouth daily. (Patient not taking: Reported on 07/16/2021)     HYDROcodone-acetaminophen (NORCO) 5-325 MG tablet Take 1 tablet by mouth every 6 (six) hours as needed for moderate pain. (Patient not taking: No sig reported) 20 tablet 0    losartan (COZAAR) 100 MG tablet TAKE 1 TABLET (100 MG TOTAL) BY MOUTH DAILY. (Patient not taking: Reported on 07/16/2021) 90 tablet 0   methocarbamol (ROBAXIN) 500 MG tablet Take 1 tablet (500 mg total) by mouth every 6 (six) hours as needed for muscle spasms (breakthrough pain). (Patient not taking: Reported on 04/21/2021) 30 tablet 0   mirtazapine (REMERON) 30 MG tablet Take 1 tablet (30 mg total) by mouth at bedtime. (Patient not taking: No sig reported) 30 tablet 0   piperacillin-tazobactam (ZOSYN) 3.375 GM/50ML IVPB Inject 50 mLs (3.375 g total) into the vein every 8 (eight) hours. (Patient not taking: Reported on 04/21/2021) 50 mL    polyethylene glycol powder (GLYCOLAX/MIRALAX) 17 GM/SCOOP powder Take by mouth. (Patient not taking: Reported on 07/16/2021)     predniSONE (DELTASONE) 5 MG tablet Take by mouth. (Patient not taking: Reported on 07/16/2021)     sodium chloride 0.9 % infusion Inject 111 mLs into the vein continuous. (Patient not taking: No sig reported)  0   No current facility-administered medications for this visit.   Facility-Administered Medications Ordered in Other Visits  Medication Dose Route Frequency Provider Last Rate Last Admin   sodium chloride flush (NS) 0.9 % injection 10 mL  10 mL Intravenous PRN Lequita Asal, MD   10 mL at 12/03/20 1138    Review of Systems  Constitutional:  Negative for chills, fever, malaise/fatigue and weight loss.  HENT:  Negative for congestion, hearing loss, nosebleeds, sinus pain, sore throat and tinnitus.   Eyes:  Negative for blurred vision, double vision and pain.  Respiratory:  Negative for cough, hemoptysis, shortness of breath and wheezing.   Cardiovascular:  Negative for chest pain, palpitations and leg swelling.  Gastrointestinal:  Negative for abdominal pain, blood in stool, constipation, diarrhea, melena, nausea and vomiting.  Genitourinary:  Negative for dysuria and urgency.  Musculoskeletal:  Negative for back pain,  falls, joint pain and myalgias.  Skin:  Negative for itching and rash.  Neurological:  Negative for dizziness, tingling, sensory change, loss of consciousness, weakness and headaches.  Endo/Heme/Allergies:  Negative for environmental allergies. Does not bruise/bleed easily.  Psychiatric/Behavioral:  Positive for memory loss. Negative for depression. The patient is not nervous/anxious and does not have insomnia.    Performance status (ECOG): 1-2  Vital Signs: Blood pressure 128/74, pulse 87, temperature (!) 96.7 F (35.9 C), resp. rate 16, weight 137 lb 7.3 oz (62.4 kg), SpO2 99 %.  Physical Exam Constitutional:      Appearance: He is not ill-appearing.  Comments: accompanied  HENT:     Head: Normocephalic.     Nose: No congestion.     Mouth/Throat:     Mouth: Mucous membranes are moist.  Eyes:     General: No scleral icterus.    Conjunctiva/sclera: Conjunctivae normal.  Cardiovascular:     Rate and Rhythm: Normal rate and regular rhythm.  Abdominal:     Palpations: Abdomen is soft.     Tenderness: There is no abdominal tenderness.     Comments: PEG   Musculoskeletal:        General: No deformity.     Right lower leg: No edema.     Left lower leg: No edema.  Lymphadenopathy:     Cervical: No cervical adenopathy.  Skin:    General: Skin is warm and dry.     Comments: Port in place  Neurological:     Mental Status: He is alert and oriented to person, place, and time. Mental status is at baseline.  Psychiatric:        Mood and Affect: Mood normal.        Behavior: Behavior normal.    No visits with results within 3 Day(s) from this visit.  Latest known visit with results is:  Admission on 05/28/2021, Discharged on 05/28/2021  Component Date Value Ref Range Status   WBC 05/28/2021 6.1  4.0 - 10.5 K/uL Final   RBC 05/28/2021 4.31  4.22 - 5.81 MIL/uL Final   Hemoglobin 05/28/2021 13.0  13.0 - 17.0 g/dL Final   HCT 05/28/2021 39.8  39.0 - 52.0 % Final   MCV 05/28/2021  92.3  80.0 - 100.0 fL Final   MCH 05/28/2021 30.2  26.0 - 34.0 pg Final   MCHC 05/28/2021 32.7  30.0 - 36.0 g/dL Final   RDW 05/28/2021 13.7  11.5 - 15.5 % Final   Platelets 05/28/2021 368  150 - 400 K/uL Final   nRBC 05/28/2021 0.0  0.0 - 0.2 % Final   Performed at [facility], [address] [place], [place] [address]   Sodium 05/28/2021 135  135 - 145 mmol/L Final   Potassium 05/28/2021 4.1  3.5 - 5.1 mmol/L Final   Chloride 05/28/2021 95 (A) 98 - 111 mmol/L Final   CO2 05/28/2021 31  22 - 32 mmol/L Final   Glucose, Bld 05/28/2021 142 (A) 70 - 99 mg/dL Final   Glucose reference range applies only to samples taken after fasting for at least 8 hours.   BUN 05/28/2021 14  8 - 23 mg/dL Final   Creatinine, Ser 05/28/2021 0.60 (A) 0.61 - 1.24 mg/dL Final   Calcium 05/28/2021 9.6  8.9 - 10.3 mg/dL Final   GFR, Estimated 05/28/2021 >60  >60 mL/min Final   Comment: (NOTE) Calculated using the CKD-EPI Creatinine Equation (2021)    Anion gap 05/28/2021 9  5 - 15 Final   Performed at [facility] [address] [place], [place] [address]    Assessment & Plan:  1.   Clinical T3NxMx/stage 3 nasopharyngeal carcinoma- unresectable. S/p 5 weeks of concurrent cisplatin and radiation (shortened x 3 fractions d/t mucositis). Completed 10/24/20. 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 Original plan was for adjuvant chemotherapy after concurrent chemo based on high risk feature of bulky disease. EBV was negative. Case discussed at tumor board on 12/24/20 with recommendation for adjuvant chemotherapy. Patient declined. PET from 02/22/21 showed interval decrease in size and FDG uptake of mass of sphenoid sinus,  posterior nasopharynx, and skull base. Case discussed at tumor board on 02/25/21 with recommendation for MRI head given persistent FDG uptake. MRI revealed no apparent progressive disease. Clinically, he was symptomatic with intractable  headaches (see below). I've requested recent visit notes from ENT who has been following patient in the interim.   2. Headache- hx of severe headache and right sided facial pain. Multiple ER visits. He was diagnosed with bilateral pyogenic ethmoid sinusitis at [facility] which was drained on 4/18.  He was treated with multiple antibiotics and antifungals.  Pathology from 03/15/2021 biopsy showed atypical cells but negative for active malignancy.  No signs of active tumor were seen. He is followed by neurology. Headaches have essentially resolved to very mild symptoms.   3.   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 hearing aide evaluation was recommended. Stable  3.   Xerostomia- resolved  4.  Falls- last fall in July per patient. Balance and strength improving.   5. Port-a-cath- port hasn't been flushed in several months. Patient wishes to have removed. Would consider replacing if needed in the future. Will send referral to Dr. Lucky Cowboy who placed his port for removal  6. PEG tube- Now takes all food by mouth. Weight up trending. No swallowing problems. Patient eager to have peg removed. Will refer to GI for removal.   Will get notes from ENT. Per patient they have released him from surveillance. Will have him return for surveillance and to establish care with MD in 6 months or sooner if symptomatic.   I discussed the assessment and treatment plan with the patient.  The patient was provided an opportunity to ask questions and all were answered.  The patient agreed with the plan and demonstrated an understanding of the instructions.  The patient was advised to call back if the symptoms worsen or if the condition fails to improve as anticipated.  Beckey Rutter, DNP, AGNP-C [facility]  CC: Dr. Rogue Bussing

## 2021-07-16 NOTE — Telephone Encounter (Signed)
Faxed over port removal request to Dr. Bunnie Domino office at [phone] fax went through. Also, contacted Dr. Maisie Fus office in regards to last office note on pt for appt 8/9 per Lauren's request. Waiting on office notes from office.

## 2021-07-16 NOTE — H&P (View-Only) (Signed)
[facility]  [address] [place], [place] [address] Phone: [phone]  Fax: [phone]   Clinic Day: 07/16/21  Referring physician: Verita Lamb, NP  Chief Complaint: Kenneth Franklin is a 75 y.o. male with stage III nasopharyngeal carcinoma who is seen for review of interval PET scan and discussion regarding direction of therapy.  HPI: DORMAN CLINKSCALES is a 75 y.o. 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  Pathology revealed keratinizing squamous cell carcinoma, moderately differentiated.   Head MRI on 08/13/2020 revealed a 5.9 cm nasopharyngeal mass consistent with known nasopharyngeal carcinoma with involvement of the clivus and sphenoid sinus. There was moderate chronic small vessel ischemic disease and cerebral atrophy.  There was no intracranial extension.  PET scan on 08/18/2020 revealed a 5.7 x 4.2 cm destructive hypermetabolic mass (SUV [id number]) involving the sphenoid sinus, skull base and posterior nasopharynx.  This was consistent with patient's known squamous cell carcinoma. There was no locoregional lymphadenopathy or distant metastatic disease.  He received IMRT from 09/08/2020 - 10/21/2020.  He received 5 weeks of cisplatin (09/08/2020 - 09/29/2020; 10/13/2020 - 10/20/2020).  He did not receive treatment on 10/06/2020 secondary to poor urine output.  Otolaryngologic exam on 12/22/2020 revealed left nasal cavity septal deviation, convex, airway obstruction 75-100%.  EBV was negative on 01/28/2021.   PET scan on 02/22/2021 revealed interval decrease in size and degree of FDG uptake associated with the FDG avid mass involving the sphenoid sinus, posterior nasopharynx and skull base. There were no signs of locoregional lymphadenopathy or distant metastatic disease. Increased tracer uptake was identified within the esophagus. Correlate for any clinical signs or symptoms of esophagitis.  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 Audiogram on 12/22/2020 was stable. Hearing aid evaluation was recommended.  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 Hearing aid evaluation was recommended.  He was admitted to [facility] from 10/25/2020 - 11/01/2020 with mucositis.  He was treated with Magic mouthwash, viscous lidocaine, IV morphine and Toradol.   Interval History: Patient returns to clinic for evaluation and consideration of removal of port-a-cath and peg tube.  He has a history of nasopharyngeal cancer on 9/21.  Plan was for concurrent chemo and radiation followed by adjuvant chemotherapy.  Patient did not receive adjuvant chemotherapy.  PET scan in March showed decreased size and FDG uptake of primary mass.  He began having severe headaches and was eventually transferred to [facility].  He was diagnosed with bilateral pyogenic ethmoid sinusitis which was drained on 4/18.  He was treated with multiple antibiotics and antifungals.  Pathology from 03/15/2021 biopsy showed atypical cells but negative for active malignancy.  No signs of active tumor were seen.  PEG tube was placed due to malnutrition.  [facility]. He's had several falls. Some concern was raised about dementia at both rehab and during hospitalization.   Recently he feels that he is doing better. Eating and drinking normally. Not taking anything via peg. Swallowing normally. Port hasn't been used or flushed in several months. He'd like to have it removed. Daughter accompanies him today and confirms history. Says he is doing well at home. No falls. Seems to be getting stronger. No more recent headaches.    Past Medical History:  Diagnosis Date   Acute ischemic stroke ([place]) 2017  Alcohol abuse    Anxiety    Cancer ([place])    Depression     Diabetes mellitus without complication ([place])    Dyspnea    pcp knows and ordered rescue inhaler   Hyperlipidemia    Hypertension    Skin cancer    Squamous Cell Carcinoma In Situ    Past Surgical History:  Procedure Laterality Date   CATARACT EXTRACTION Bilateral    COLONOSCOPY     NASOPHARYNGOSCOPY N/A 08/12/2020   Procedure: ENDOSCOPIC NASOPHARYNGOSCOPY WITH BIOPSY;  Surgeon: Margaretha Sheffield, MD;  Location: [facility] ORS;  Service: ENT;  Laterality: N/A;   PORTA CATH INSERTION N/A 08/24/2020   Procedure: PORTA CATH INSERTION;  Surgeon: Algernon Huxley, MD;  Location: [facility];  Service: Cardiovascular;  Laterality: N/A;    Family History  Problem Relation Age of Onset   Diabetes Brother     Social History:  reports that he quit smoking about 23 months ago. His smoking use included cigars and cigarettes. He has a 40.00 pack-year smoking history. He has never used smokeless tobacco. He reports that he does not currently use alcohol. He reports that he does not use drugs. He smoked 1 pack/day x 40 years.  He denies exposure of radiation or toxins; he did work with asphalt. He lives alone in [place]. He is widowed. Ernestina Patches (step daughter) can be reached at [phone]. The patient is accompanied by a family member today.  Allergies:  Allergies  Allergen Reactions   Propoxyphene     Unknown reaction    Current Medications: Current Outpatient Medications  Medication Sig Dispense Refill   acetaminophen (TYLENOL) 325 MG tablet Take 975 mg by mouth in the morning, at noon, and at bedtime.     albuterol (PROVENTIL) (2.5 MG/3ML) 0.083% nebulizer solution PLEASE SEE ATTACHED FOR DETAILED DIRECTIONS     Amino Acids-Protein Hydrolys (FEEDING SUPPLEMENT, PRO-STAT SUGAR FREE 64,) LIQD Take 30 mLs by mouth 2 (two) times daily.     amLODipine (NORVASC) 5 MG tablet Take 1 tablet by mouth daily.     aspirin EC 81 MG tablet Take 81 mg by mouth daily. Swallow whole.     carBAMazepine  (TEGRETOL) 100 MG/5ML suspension Take 10 mLs by mouth 2 (two) times daily.     clopidogrel (PLAVIX) 75 MG tablet Take by mouth.     escitalopram (LEXAPRO) 20 MG tablet Take 20 mg by mouth every morning.      famotidine (PEPCID) 40 MG tablet Take by mouth.     folic acid (FOLVITE) 1 MG tablet Take 1 tablet by mouth daily.     gabapentin (NEURONTIN) 300 MG capsule Take by mouth.     losartan (COZAAR) 50 MG tablet Take 50 mg by mouth daily.     ondansetron (ZOFRAN-ODT) 4 MG disintegrating tablet Take by mouth.     Oxycodone HCl 10 MG TABS Take 10 mg by mouth every 6 (six) hours as needed (pain, moderate to severe).     polyvinyl alcohol (LIQUIFILM TEARS) 1.4 % ophthalmic solution Apply to eye.     sodium chloride (OCEAN) 0.65 % nasal spray Place into the nose.     SUMAtriptan (IMITREX) 25 MG tablet Take by mouth.     thiamine 100 MG tablet Take 1 tablet by mouth daily.     atorvastatin (LIPITOR) 80 MG tablet TAKE 1 TABLET (80 MG TOTAL) BY MOUTH NIGHTLY. (Patient not taking: No sig reported) 90 tablet 0   clonazePAM (KLONOPIN)  0.25 MG disintegrating tablet Take 1 tablet (0.25 mg total) by mouth 3 (three) times daily. (Patient not taking: No sig reported) 60 tablet 0   divalproex (DEPAKOTE ER) 250 MG 24 hr tablet Take 250-750 mg by mouth See admin instructions. Take 1 tablet ('250mg'$ ) by mouth at bedtime for 7 nights, then take 2 tablets ('500mg'$ ) by mouth at bedtime for 7 nights then take 3 tablets ('750mg'$ ) by mouth at bedtime (Patient not taking: Reported on 07/16/2021)     fluticasone (FLOVENT HFA) 110 MCG/ACT inhaler Inhale 2 puffs into the lungs daily as needed (shortness of breath). (Patient not taking: No sig reported)     glipiZIDE (GLUCOTROL) 5 MG tablet Take 1 tablet by mouth daily. (Patient not taking: Reported on 07/16/2021)     HYDROcodone-acetaminophen (NORCO) 5-325 MG tablet Take 1 tablet by mouth every 6 (six) hours as needed for moderate pain. (Patient not taking: No sig reported) 20 tablet 0    losartan (COZAAR) 100 MG tablet TAKE 1 TABLET (100 MG TOTAL) BY MOUTH DAILY. (Patient not taking: Reported on 07/16/2021) 90 tablet 0   methocarbamol (ROBAXIN) 500 MG tablet Take 1 tablet (500 mg total) by mouth every 6 (six) hours as needed for muscle spasms (breakthrough pain). (Patient not taking: Reported on 04/21/2021) 30 tablet 0   mirtazapine (REMERON) 30 MG tablet Take 1 tablet (30 mg total) by mouth at bedtime. (Patient not taking: No sig reported) 30 tablet 0   piperacillin-tazobactam (ZOSYN) 3.375 GM/50ML IVPB Inject 50 mLs (3.375 g total) into the vein every 8 (eight) hours. (Patient not taking: Reported on 04/21/2021) 50 mL    polyethylene glycol powder (GLYCOLAX/MIRALAX) 17 GM/SCOOP powder Take by mouth. (Patient not taking: Reported on 07/16/2021)     predniSONE (DELTASONE) 5 MG tablet Take by mouth. (Patient not taking: Reported on 07/16/2021)     sodium chloride 0.9 % infusion Inject 111 mLs into the vein continuous. (Patient not taking: No sig reported)  0   No current facility-administered medications for this visit.   Facility-Administered Medications Ordered in Other Visits  Medication Dose Route Frequency Provider Last Rate Last Admin   sodium chloride flush (NS) 0.9 % injection 10 mL  10 mL Intravenous PRN Lequita Asal, MD   10 mL at 12/03/20 1138    Review of Systems  Constitutional:  Negative for chills, fever, malaise/fatigue and weight loss.  HENT:  Negative for congestion, hearing loss, nosebleeds, sinus pain, sore throat and tinnitus.   Eyes:  Negative for blurred vision, double vision and pain.  Respiratory:  Negative for cough, hemoptysis, shortness of breath and wheezing.   Cardiovascular:  Negative for chest pain, palpitations and leg swelling.  Gastrointestinal:  Negative for abdominal pain, blood in stool, constipation, diarrhea, melena, nausea and vomiting.  Genitourinary:  Negative for dysuria and urgency.  Musculoskeletal:  Negative for back pain,  falls, joint pain and myalgias.  Skin:  Negative for itching and rash.  Neurological:  Negative for dizziness, tingling, sensory change, loss of consciousness, weakness and headaches.  Endo/Heme/Allergies:  Negative for environmental allergies. Does not bruise/bleed easily.  Psychiatric/Behavioral:  Positive for memory loss. Negative for depression. The patient is not nervous/anxious and does not have insomnia.    Performance status (ECOG): 1-2  Vital Signs: Blood pressure 128/74, pulse 87, temperature (!) 96.7 F (35.9 C), resp. rate 16, weight 137 lb 7.3 oz (62.4 kg), SpO2 99 %.  Physical Exam Constitutional:      Appearance: He is not ill-appearing.  Comments: accompanied  HENT:     Head: Normocephalic.     Nose: No congestion.     Mouth/Throat:     Mouth: Mucous membranes are moist.  Eyes:     General: No scleral icterus.    Conjunctiva/sclera: Conjunctivae normal.  Cardiovascular:     Rate and Rhythm: Normal rate and regular rhythm.  Abdominal:     Palpations: Abdomen is soft.     Tenderness: There is no abdominal tenderness.     Comments: PEG   Musculoskeletal:        General: No deformity.     Right lower leg: No edema.     Left lower leg: No edema.  Lymphadenopathy:     Cervical: No cervical adenopathy.  Skin:    General: Skin is warm and dry.     Comments: Port in place  Neurological:     Mental Status: He is alert and oriented to person, place, and time. Mental status is at baseline.  Psychiatric:        Mood and Affect: Mood normal.        Behavior: Behavior normal.    No visits with results within 3 Day(s) from this visit.  Latest known visit with results is:  Admission on 05/28/2021, Discharged on 05/28/2021  Component Date Value Ref Range Status   WBC 05/28/2021 6.1  4.0 - 10.5 K/uL Final   RBC 05/28/2021 4.31  4.22 - 5.81 MIL/uL Final   Hemoglobin 05/28/2021 13.0  13.0 - 17.0 g/dL Final   HCT 05/28/2021 39.8  39.0 - 52.0 % Final   MCV 05/28/2021  92.3  80.0 - 100.0 fL Final   MCH 05/28/2021 30.2  26.0 - 34.0 pg Final   MCHC 05/28/2021 32.7  30.0 - 36.0 g/dL Final   RDW 05/28/2021 13.7  11.5 - 15.5 % Final   Platelets 05/28/2021 368  150 - 400 K/uL Final   nRBC 05/28/2021 0.0  0.0 - 0.2 % Final   Performed at [facility], [address] [place], [place] [address]   Sodium 05/28/2021 135  135 - 145 mmol/L Final   Potassium 05/28/2021 4.1  3.5 - 5.1 mmol/L Final   Chloride 05/28/2021 95 (A) 98 - 111 mmol/L Final   CO2 05/28/2021 31  22 - 32 mmol/L Final   Glucose, Bld 05/28/2021 142 (A) 70 - 99 mg/dL Final   Glucose reference range applies only to samples taken after fasting for at least 8 hours.   BUN 05/28/2021 14  8 - 23 mg/dL Final   Creatinine, Ser 05/28/2021 0.60 (A) 0.61 - 1.24 mg/dL Final   Calcium 05/28/2021 9.6  8.9 - 10.3 mg/dL Final   GFR, Estimated 05/28/2021 >60  >60 mL/min Final   Comment: (NOTE) Calculated using the CKD-EPI Creatinine Equation (2021)    Anion gap 05/28/2021 9  5 - 15 Final   Performed at [facility], [address], [place], [place] [address]    Assessment & Plan:  1.   Clinical T3NxMx/stage 3 nasopharyngeal carcinoma- unresectable. S/p 5 weeks of concurrent cisplatin and radiation (shortened x 3 fractions d/t mucositis). Completed 10/24/20. 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 Original plan was for adjuvant chemotherapy after concurrent chemo based on high risk feature of bulky disease. EBV was negative. Case discussed at tumor board on 12/24/20 with recommendation for adjuvant chemotherapy. Patient declined. PET from 02/22/21 showed interval decrease in size and FDG uptake of mass of sphenoid sinus,  posterior nasopharynx, and skull base. Case discussed at tumor board on 02/25/21 with recommendation for MRI head given persistent FDG uptake. MRI revealed no apparent progressive disease. Clinically, he was symptomatic with intractable  headaches (see below). I've requested recent visit notes from ENT who has been following patient in the interim.   2. Headache- hx of severe headache and right sided facial pain. Multiple ER visits. He was diagnosed with bilateral pyogenic ethmoid sinusitis at [facility] which was drained on 4/18.  He was treated with multiple antibiotics and antifungals.  Pathology from 03/15/2021 biopsy showed atypical cells but negative for active malignancy.  No signs of active tumor were seen. He is followed by neurology. Headaches have essentially resolved to very mild symptoms.   3.   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 hearing aide evaluation was recommended. Stable  3.   Xerostomia- resolved  4.  Falls- last fall in July per patient. Balance and strength improving.   5. Port-a-cath- port hasn't been flushed in several months. Patient wishes to have removed. Would consider replacing if needed in the future. Will send referral to Dr. Lucky Cowboy who placed his port for removal  6. PEG tube- Now takes all food by mouth. Weight up trending. No swallowing problems. Patient eager to have peg removed. Will refer to GI for removal.   Will get notes from ENT. Per patient they have released him from surveillance. Will have him return for surveillance and to establish care with MD in 6 months or sooner if symptomatic.   I discussed the assessment and treatment plan with the patient.  The patient was provided an opportunity to ask questions and all were answered.  The patient agreed with the plan and demonstrated an understanding of the instructions.  The patient was advised to call back if the symptoms worsen or if the condition fails to improve as anticipated.  Beckey Rutter, DNP, AGNP-C [facility]  CC: Dr. Rogue Bussing

## 2021-07-21 ENCOUNTER — Other Ambulatory Visit (INDEPENDENT_AMBULATORY_CARE_PROVIDER_SITE_OTHER): Payer: Self-pay | Admitting: Nurse Practitioner

## 2021-07-22 ENCOUNTER — Ambulatory Visit: Payer: Medicare Other | Admitting: Oncology

## 2021-07-22 ENCOUNTER — Encounter: Payer: Self-pay | Admitting: Vascular Surgery

## 2021-07-22 ENCOUNTER — Encounter
Admission: RE | Disposition: A | Discharge status: Home / Self Care | Payer: Self-pay | Source: Home / Self Care | Attending: Vascular Surgery

## 2021-07-22 ENCOUNTER — Ambulatory Visit
Admission: RE | Admit: 2021-07-22 | Discharge: 2021-07-22 | Disposition: A | Discharge status: Home / Self Care | Payer: Medicare Other | Attending: Vascular Surgery | Admitting: Vascular Surgery

## 2021-07-22 ENCOUNTER — Other Ambulatory Visit: Payer: Self-pay

## 2021-07-22 DIAGNOSIS — C119 Malignant neoplasm of nasopharynx, unspecified: Secondary | ICD-10-CM | POA: Insufficient documentation

## 2021-07-22 DIAGNOSIS — Z452 Encounter for adjustment and management of vascular access device: Secondary | ICD-10-CM | POA: Diagnosis not present

## 2021-07-22 DIAGNOSIS — Z87891 Personal history of nicotine dependence: Secondary | ICD-10-CM | POA: Diagnosis not present

## 2021-07-22 DIAGNOSIS — Z79899 Other long term (current) drug therapy: Secondary | ICD-10-CM | POA: Insufficient documentation

## 2021-07-22 DIAGNOSIS — Z7982 Long term (current) use of aspirin: Secondary | ICD-10-CM | POA: Insufficient documentation

## 2021-07-22 DIAGNOSIS — R519 Headache, unspecified: Secondary | ICD-10-CM | POA: Insufficient documentation

## 2021-07-22 DIAGNOSIS — Z7902 Long term (current) use of antithrombotics/antiplatelets: Secondary | ICD-10-CM | POA: Diagnosis not present

## 2021-07-22 DIAGNOSIS — Z888 Allergy status to other drugs, medicaments and biological substances status: Secondary | ICD-10-CM | POA: Diagnosis not present

## 2021-07-22 DIAGNOSIS — H903 Sensorineural hearing loss, bilateral: Secondary | ICD-10-CM | POA: Diagnosis not present

## 2021-07-22 HISTORY — PX: PORTA CATH REMOVAL: CATH118286

## 2021-07-22 SURGERY — PORTA CATH REMOVAL
Anesthesia: Moderate Sedation

## 2021-07-22 MED ORDER — CHLORHEXIDINE GLUCONATE CLOTH 2 % EX PADS: 6.0000 | MEDICATED_PAD | Freq: Every day | CUTANEOUS | Status: DC

## 2021-07-22 MED ORDER — ONDANSETRON HCL 4 MG/2ML IJ SOLN: 4.0000 mg | Freq: Four times a day (QID) | INTRAMUSCULAR | Status: DC | PRN

## 2021-07-22 MED ORDER — HYDROMORPHONE HCL 1 MG/ML IJ SOLN: 1.0000 mg | Freq: Once | INTRAMUSCULAR | Status: DC | PRN

## 2021-07-22 MED ORDER — METHYLPREDNISOLONE SODIUM SUCC 125 MG IJ SOLR: 125.0000 mg | Freq: Once | INTRAMUSCULAR | Status: DC | PRN

## 2021-07-22 MED ORDER — DIPHENHYDRAMINE HCL 50 MG/ML IJ SOLN: 50.0000 mg | Freq: Once | INTRAMUSCULAR | Status: DC | PRN

## 2021-07-22 MED ORDER — SODIUM CHLORIDE 0.9 % IV SOLN: INTRAVENOUS | Status: DC

## 2021-07-22 MED ORDER — MIDAZOLAM HCL 2 MG/2ML IJ SOLN
INTRAMUSCULAR | Status: DC | PRN
  Administered 2021-07-22: 2 mg via INTRAVENOUS

## 2021-07-22 MED ORDER — FENTANYL CITRATE (PF) 100 MCG/2ML IJ SOLN
INTRAMUSCULAR | Status: AC
  Filled 2021-07-22: qty 2

## 2021-07-22 MED ORDER — FENTANYL CITRATE (PF) 100 MCG/2ML IJ SOLN
INTRAMUSCULAR | Status: DC | PRN
  Administered 2021-07-22: 50 ug via INTRAVENOUS

## 2021-07-22 MED ORDER — MIDAZOLAM HCL 2 MG/ML PO SYRP: 8.0000 mg | ORAL_SOLUTION | Freq: Once | ORAL | Status: DC | PRN

## 2021-07-22 MED ORDER — FAMOTIDINE 20 MG PO TABS: 40.0000 mg | ORAL_TABLET | Freq: Once | ORAL | Status: DC | PRN

## 2021-07-22 MED ORDER — MIDAZOLAM HCL 2 MG/2ML IJ SOLN
INTRAMUSCULAR | Status: AC
  Filled 2021-07-22: qty 2

## 2021-07-22 MED ORDER — CEFAZOLIN SODIUM-DEXTROSE 2-4 GM/100ML-% IV SOLN
2.0000 g | Freq: Once | INTRAVENOUS | Status: AC
  Administered 2021-07-22: 2 g via INTRAVENOUS

## 2021-07-22 SURGICAL SUPPLY — 9 items
COVER SURGICAL LIGHT HANDLE (MISCELLANEOUS) ×2 IMPLANT
DERMABOND ADVANCED (GAUZE/BANDAGES/DRESSINGS) ×1
DERMABOND ADVANCED .7 DNX12 (GAUZE/BANDAGES/DRESSINGS) ×1 IMPLANT
PACK ANGIOGRAPHY (CUSTOM PROCEDURE TRAY) ×2 IMPLANT
PENCIL ELECTRO HAND CTR (MISCELLANEOUS) ×2 IMPLANT
SPONGE XRAY 4X4 16PLY STRL (MISCELLANEOUS) ×2 IMPLANT
SUT MNCRL AB 4-0 PS2 18 (SUTURE) ×2 IMPLANT
SUT VIC AB 3-0 SH 27 (SUTURE) ×2
SUT VIC AB 3-0 SH 27X BRD (SUTURE) ×1 IMPLANT

## 2021-07-22 NOTE — Interval H&P Note (Signed)
History and Physical Interval Note:  07/22/2021 8:20 AM  Kenneth Franklin  has presented today for surgery, with the diagnosis of Porta Cath Removal    Nasopharygeal Ca.  The various methods of treatment have been discussed with the patient and family. After consideration of risks, benefits and other options for treatment, the patient has consented to  Procedure(s): PORTA CATH REMOVAL (N/A) as a surgical intervention.  The patient's history has been reviewed, patient examined, no change in status, stable for surgery.  I have reviewed the patient's chart and labs.  Questions were answered to the patient's satisfaction.     Leotis Pain

## 2021-07-22 NOTE — Op Note (Signed)
[facility]       Operative Note  Date: 07/22/2021  Preoperative diagnosis:  1. Nasopharyngeal cancer, completed therapy no longer using port  Postoperative diagnosis:  Same as above  Procedures: #1. Removal of right jugular port a cath   Surgeon: Leotis Pain, MD  Anesthesia: Local with moderate conscious sedation for 11 minutes using 2 mg of Versed and 50 mcg of Fentanyl  Fluoroscopy time: none  Contrast used: 0  Estimated blood loss: Minimal  Indication for the procedure:  The patient is a 75 y.o. male who has completed therapy for nasopharyngeal cancer and no longer needs their Port-A-Cath. The patient desires to have this removed. Risks and benefits including need for potential replacement with recurrent disease were discussed and patient is agreeable to proceed.  Description of procedure: The patient was brought to the vascular and interventional radiology suite. Moderate conscious sedation was administered during a face to face encounter with the patient throughout the procedure with my supervision of the RN administering medicines and monitoring the patient's vital signs, pulse oximetry, telemetry and mental status throughout from the start of the procedure until the patient was taken to the recovery room.  The right neck chest and shoulder were sterilely prepped and draped, and a sterile surgical field was created. The area was then anesthetized with 1% lidocaine copiously. The previous incision was reopened and electrocautery used to dissected down to the port and the catheter. These were dissected free and the catheter was gently removed from the vein in its entirety. The port was dissected out from the fibrous connective tissue and the Prolene sutures were removed. The port was then removed in its entirety including the catheter. The wound was then closed with a 3-0 Vicryl and a 4-0 Monocryl and Dermabond was placed as  a dressing. The patient was then taken to the recovery room in stable condition having tolerated the procedure well.  Complications: none  Condition: stable   Leotis Pain, MD 07/22/2021 8:52 AM   This note was created with Dragon Medical transcription system. Any errors in dictation are purely unintentional.

## 2021-08-19 ENCOUNTER — Other Ambulatory Visit: Payer: Self-pay

## 2021-08-19 ENCOUNTER — Other Ambulatory Visit: Payer: Medicare Other | Admitting: Primary Care

## 2021-08-19 DIAGNOSIS — R634 Abnormal weight loss: Secondary | ICD-10-CM

## 2021-08-19 DIAGNOSIS — J449 Chronic obstructive pulmonary disease, unspecified: Secondary | ICD-10-CM

## 2021-08-19 DIAGNOSIS — Z515 Encounter for palliative care: Secondary | ICD-10-CM

## 2021-08-19 DIAGNOSIS — E440 Moderate protein-calorie malnutrition: Secondary | ICD-10-CM

## 2021-08-19 NOTE — Progress Notes (Signed)
[facility] Pall[facility] Consult Note Telephone: [phone]  Fax: [phone]    Date of encounter: 08/19/21 3:14 PM PATIENT NAME: Kenneth Franklin [address] [place] [address]   [phone] (home)  DOB: [birth year] MRN: [id number] PRIMARY CARE PROVIDER:    Verita Lamb, NP,  [address] [place] [address] [phone]  REFERRING PROVIDER:   Verita Lamb, NP [address] [place],  [place] [address] [phone]  RESPONSIBLE PARTY:    Contact Information     Name Relation Home Work [place] [facility]) Daughter [phone]  [phone]   Housley,Darryl Brother [phone]  [phone]   [name] Niece   [phone]   Eder, Macek   [phone]        I met face to face with patient and PCG in the home. Palliative Care was asked to follow this patient by consultation request of  Verita Lamb, NP to address advance care planning and complex medical decision making. This is a follow up visit.                                   ASSESSMENT AND PLAN / RECOMMENDATIONS:   Advance Care Planning/Goals of Care: Goals include to maximize quality of life and symptom management. Our advance care planning conversation included a discussion about:    The value and importance of advance care planning  Exploration of personal, cultural or spiritual beliefs that might influence medical decisions  creation of an  advance directive document - created copy, reviewed all choices CODE STATUS: FULL scope, limited iv, limited feeding tube.  Symptom Management/Plan:   Dyspnea: Endorses returning to smoking, states he may be ready to quit. States a smoking care giver got him smoking again. He endorses this helps with anxiety. He is willing to try buspirone, started on 7.5 mg bid x 7 days, then 10 mg bid until next meeting. Sent #105 2 rf,  to [place]. PO2 = 99% room air.  Nutrition: Has peg still, and wants it out. He  has appt 10/19 to remove. His wt has stayed stable since June 2022 when he was in [facility]. 4 mos later is still high 130's. Encouraged to take supplements with high calories due to underweight, bmi =18.4.  Med management: All meds reviewed, updated his list.  Mobility: Is safe walking in home  without device. States no falls recently. Has caregivers and assist from family for mobility and safety concerns.  Follow up Palliative Care Visit: Palliative care will continue to follow for complex medical decision making, advance care planning, and clarification of goals. Return 3 weeks or prn.  I spent 60 minutes providing this consultation. More than 50% of the time in this consultation was spent in counseling and care coordination.  PPS: 50%  HOSPICE ELIGIBILITY/DIAGNOSIS: TBD  Chief Complaint: debility  HISTORY OF PRESENT ILLNESS:  Kenneth Franklin is a 75 y.o. year old male  with h/o head and neck cancer, abnormal weight loss, debility. Has been home from SNF for several months and improving with stamina. Has better appetite and eating normal portions. His mentation is clear and back to his baseline. He and his daughter make his medical decisions.   History obtained from review of EMR, discussion with primary team, and interview with family, facility staff/caregiver and/or Kenneth Franklin.  I reviewed available labs, medications, imaging, studies and related documents from the EMR.  Records reviewed and summarized above.   ROS  General: NAD EYES:  endorses vision changes, has exam scheduled ENMT: denies dysphagia Cardiovascular: denies chest pain, endorses  DOE Pulmonary: endorses cough, denies increased SOB, has started smoking again Abdomen: endorses good appetite, denies constipation, endorses continence of bowel GU: denies dysuria, endorses continence of urine MSK:  denies weakness,  no falls reported Skin: denies rashes or wounds Neurological: denies pain, denies insomnia Psych:  Endorses positive mood Heme/lymph/immuno: denies bruises, abnormal bleeding  Physical Exam: Current and past weights: stable since 6/22, now 136 lbs. Constitutional: NAD General: frail appearing, thin EYES: anicteric sclera, lids intact, no discharge  ENMT: intact hearing, oral mucous membranes moist, has partial plate CV:  D6Q2, RRR, no LE edema Pulmonary: lungs diminished all lobes, + increased work of breathing, + cough,  tripodding, room air Abdomen: intake 100%, has g tube, no ascites GU: deferred MSK: ++ sarcopenia, moves all extremities, ambulatory (I) Skin: warm and dry, no rashes or wounds on visible skin Neuro:  ++ generalized weakness,  no cognitive impairment Psych: anxious affect, A and O x 3 Hem/lymph/immuno: no widespread bruising   Thank you for the opportunity to participate in the care of Kenneth Franklin.  The palliative care team will continue to follow. Please call our office at [phone] if we can be of additional assistance.   Jason Coop, NP   COVID-19 PATIENT SCREENING TOOL Asked and negative response unless otherwise noted:   Have you had symptoms of covid, tested positive or been in contact with someone with symptoms/positive test in the past 5-10 days?

## 2021-09-14 ENCOUNTER — Other Ambulatory Visit: Payer: Self-pay

## 2021-09-14 ENCOUNTER — Other Ambulatory Visit: Payer: Medicare Other | Admitting: Primary Care

## 2021-09-14 DIAGNOSIS — F17200 Nicotine dependence, unspecified, uncomplicated: Secondary | ICD-10-CM

## 2021-09-14 DIAGNOSIS — Z515 Encounter for palliative care: Secondary | ICD-10-CM

## 2021-09-14 DIAGNOSIS — E440 Moderate protein-calorie malnutrition: Secondary | ICD-10-CM

## 2021-09-14 DIAGNOSIS — J449 Chronic obstructive pulmonary disease, unspecified: Secondary | ICD-10-CM

## 2021-09-14 DIAGNOSIS — R296 Repeated falls: Secondary | ICD-10-CM

## 2021-09-14 DIAGNOSIS — E119 Type 2 diabetes mellitus without complications: Secondary | ICD-10-CM

## 2021-09-14 NOTE — Progress Notes (Signed)
[facility] Consult Note Telephone: [phone]  Fax: [phone]    Date of encounter: 09/14/21 2:54 PM PATIENT NAME: Kenneth Franklin [address] [place] [address]   [phone] (home)  DOB: [birth year] MRN: [id number] PRIMARY CARE PROVIDER:    Verita Lamb, NP,  [address] [place] [address] [phone]  REFERRING PROVIDER:   Verita Lamb, NP [address] [place],   [address] [phone]  RESPONSIBLE PARTY:    Contact Information     Name Relation Home Work [facility]) Daughter [phone]  [phone]   Melander,Darryl Brother [phone]  [phone]   [name] Niece   [phone]   Vance, Hochmuth   [phone]       I met face to face with patient and family in  home. Palliative Care was asked to follow this patient by consultation request of  Verita Lamb, NP to address advance care planning and complex medical decision making. This is a follow up visit.                                   ASSESSMENT AND PLAN / RECOMMENDATIONS:   Advance Care Planning/Goals of Care: Goals include to maximize quality of life and symptom management. Our advance care planning conversation included a discussion about:    Exploration of personal, cultural or spiritual beliefs that might influence medical decisions  Exploration of goals of care in the event of a sudden injury or illness   Review  of an advance directive document. CODE STATUS: FULL  I  reviewed  a MOST form today. The patient and family outlined their wishes for the following treatment decisions:  Cardiopulmonary Resuscitation: Attempt Resuscitation (CPR)  Medical Interventions: Full Scope of Treatment: Use intubation, advanced airway interventions, mechanical ventilation, cardioversion as indicated, medical treatment, IV fluids, etc, also provide comfort measures. Transfer to the hospital if indicated  Antibiotics:  Antibiotics if indicated  IV Fluids: IV fluids for a defined trial period  Feeding Tube: Feeding tube for a defined trial period   Symptom Management/Plan:  Blood glucose: Has had some times of shakiness and endorses bg wnl at many random checks.  Had been increasing, so started back on metformin 500 mg daily. Advised to take 250 mg bid. No recent A1C. Not taking glipizide, per home list.  Falls; Fell in house, did not have cane/walker with him. Fell on his side. Endorses not going outside in the colder months.  Anxiety: Seems improved from buspar from last visit. Taking bid and states improvement.  Smoking cessation: endorses smoking daily 3/4 pack. Discusses starting back smoking. Offered patches for cessation.  Med review: Daughter prepares from a list. Need to speak with her to reconcile current epic list. She is often not available at my visits..   Follow up Palliative Care Visit: Palliative care will continue to follow for complex medical decision making, advance care planning, and clarification of goals. Return 6 weeks or prn.  I spent 40 minutes providing this consultation. More than 50% of the time in this consultation was spent in counseling and care coordination.  PPS: 50%  HOSPICE ELIGIBILITY/DIAGNOSIS: TBD  Chief Complaint: falls  HISTORY OF PRESENT ILLNESS:  Kenneth Franklin is a 75 y.o. year old male  with falls, memory loss, debility, DM, tobacco abuse.    History obtained from review of EMR, discussion with primary team, and interview with  family, facility staff/caregiver and/or Mr. Kitchings.  I reviewed available labs, medications, imaging, studies and related documents from the EMR.  Records reviewed and summarized above.   ROS   General: NAD ENMT: denies dysphagia Cardiovascular: denies chest pain, denies DOE Pulmonary: denies cough, denies increased SOB Abdomen: endorses good appetite, denies constipation, endorses continence of bowel GU: denies dysuria,  endorses continence of urine MSK:  endorses  weakness,  + falls reported Skin: denies rashes or wounds Neurological: denies pain, denies insomnia Psych: Endorses positive mood, forgetful Heme/lymph/immuno: denies bruises, abnormal bleeding  Physical Exam: Current and past weights: stable per report Constitutional: NAD General: frail appearing, thin EYES: anicteric sclera, lids intact, no discharge  ENMT: intact hearing, oral mucous membranes moist CV:  no LE edema Pulmonary: no increased work of breathing, no cough, room air Abdomen: intake 75%,no ascites GU: deferred MSK: + sarcopenia, moves all extremities, ambulatory with device Skin: warm and dry, no rashes or wounds on visible skin Neuro:  +generalized weakness,  +cognitive impairment Psych: non-anxious affect, A and O x 3 Hem/lymph/immuno: no widespread bruising   Thank you for the opportunity to participate in the care of Kenneth Franklin.  The palliative care team will continue to follow. Please call our office at [phone] if we can be of additional assistance.   Jason Coop, NP DNP, AGPCNP-BC  COVID-19 PATIENT SCREENING TOOL Asked and negative response unless otherwise noted:   Have you had symptoms of covid, tested positive or been in contact with someone with symptoms/positive test in the past 5-10 days?

## 2021-09-15 ENCOUNTER — Encounter: Payer: Self-pay | Admitting: Gastroenterology

## 2021-09-15 ENCOUNTER — Ambulatory Visit (INDEPENDENT_AMBULATORY_CARE_PROVIDER_SITE_OTHER): Payer: Medicare Other | Admitting: Gastroenterology

## 2021-09-15 VITALS — BP 120/72 | HR 106 | Temp 97.4°F | Ht 72.0 in | Wt 139.2 lb

## 2021-09-15 DIAGNOSIS — Z4659 Encounter for fitting and adjustment of other gastrointestinal appliance and device: Secondary | ICD-10-CM | POA: Diagnosis not present

## 2021-09-15 NOTE — Progress Notes (Signed)
Gastroenterology Consultation  Referring Provider:     Verlon Au, NP Primary Care Physician:  Verita Lamb, NP Primary Gastroenterologist:  Dr. Allen Norris     Reason for Consultation:     PEG tube removal        HPI:   Kenneth Franklin is a 75 y.o. y/o male referred for consultation & management of PEG tube removal by Dr. Verita Lamb, NP.  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  His most recent notes state that the patient has been eating without difficulty by mouth without the need to use the feeding tube.  From the reading of the notes it appears that the patient has a feeding tube that is retained with a balloon.  Past Medical History:  Diagnosis Date   Acute ischemic stroke ([place]) 2017   Alcohol abuse    Anxiety    Cancer ([place])    Depression    Diabetes mellitus without complication ([place])    Dyspnea    pcp knows and ordered rescue inhaler   Hyperlipidemia    Hypertension    Skin cancer    Squamous Cell Carcinoma In Situ    Past Surgical History:  Procedure Laterality Date   CATARACT EXTRACTION Bilateral    COLONOSCOPY     NASOPHARYNGOSCOPY N/A 08/12/2020   Procedure: ENDOSCOPIC NASOPHARYNGOSCOPY WITH BIOPSY;  Surgeon: Margaretha Sheffield, MD;  Location: [facility];  Service: ENT;  Laterality: N/A;   PORTA CATH INSERTION N/A 08/24/2020   Procedure: PORTA CATH INSERTION;  Surgeon: Algernon Huxley, MD;  Location: [facility];  Service: Cardiovascular;  Laterality: N/A;   PORTA CATH REMOVAL N/A 07/22/2021   Procedure: PORTA CATH REMOVAL;  Surgeon: Algernon Huxley, MD;  Location: [facility];  Service: Cardiovascular;  Laterality: N/A;    Prior to Admission medications   Medication Sig Start Date End Date Taking? Authorizing Provider  acetaminophen (TYLENOL) 325 MG tablet Take 975 mg by mouth in the morning, at  noon, and at bedtime.    [provider]  albuterol (PROVENTIL) (2.5 MG/3ML) 0.083% nebulizer solution PLEASE SEE ATTACHED FOR DETAILED DIRECTIONS 06/14/21   [provider]  amLODipine (NORVASC) 5 MG tablet Take 1 tablet by mouth daily. 03/31/21 03/31/22  [provider]  aspirin EC 81 MG tablet Take 81 mg by mouth daily. Swallow whole.    [provider]  atorvastatin (LIPITOR) 80 MG tablet TAKE 1 TABLET (80 MG TOTAL) BY MOUTH NIGHTLY. Patient not taking: Reported on 08/19/2021 11/14/16   Roselee Nova, MD  busPIRone (BUSPAR) 5 MG tablet Take 7.5 mg by mouth 2 (two) times daily. X 1 week, then 10 mg bid.    [provider]  carBAMazepine (TEGRETOL) 100 MG/5ML suspension Take 10 mLs by mouth 2 (two) times daily. 03/30/21 03/30/22  [provider]  clonazePAM (KLONOPIN) 0.25 MG disintegrating tablet Take 1 tablet (0.25 mg total) by mouth 3 (three) times daily. 03/14/21   [name], MD  clopidogrel (PLAVIX) 75 MG tablet Take by mouth. 06/11/21 06/11/22  [provider]  divalproex (DEPAKOTE ER) 250 MG 24 hr tablet Take 250-750 mg by mouth See admin instructions. Take 1 tablet (250mg ) by mouth at bedtime for 7 nights, then take 2 tablets (500mg ) by mouth at bedtime for 7 nights then take 3 tablets (750mg ) by mouth at  bedtime Patient not taking: No sig reported 03/03/21   [provider]  escitalopram (LEXAPRO) 20 MG tablet Take 20 mg by mouth every morning.     [provider]  famotidine (PEPCID) 40 MG tablet Take by mouth. 06/11/21   [provider]  fluticasone (FLOVENT HFA) 110 MCG/ACT inhaler Inhale 2 puffs into the lungs daily as needed (shortness of breath).    [provider]  folic acid (FOLVITE) 1 MG tablet Take 1 tablet by mouth daily. 03/31/21 03/31/22  [provider]  gabapentin (NEURONTIN) 300 MG capsule Take by mouth. 03/30/21 07/11/22  [provider]  glipiZIDE (GLUCOTROL) 5  MG tablet Take 1 tablet by mouth daily. Patient not taking: No sig reported 06/23/21   [provider]  HYDROcodone-acetaminophen (NORCO) 5-325 MG tablet Take 1 tablet by mouth every 6 (six) hours as needed for moderate pain. Patient not taking: No sig reported 02/24/21   Lequita Asal, MD  losartan (COZAAR) 50 MG tablet Take 50 mg by mouth daily. 05/03/21   [provider]  mirtazapine (REMERON) 30 MG tablet Take 1 tablet (30 mg total) by mouth at bedtime. Patient not taking: No sig reported 06/02/17   Fritzi Mandes, MD  Oxycodone HCl 10 MG TABS Take 10 mg by mouth every 6 (six) hours as needed (pain, moderate to severe). Patient not taking: Reported on 08/19/2021    [provider]  polyethylene glycol powder (GLYCOLAX/MIRALAX) 17 GM/SCOOP powder Take by mouth. Patient not taking: No sig reported 03/14/21   [provider]  polyvinyl alcohol (LIQUIFILM TEARS) 1.4 % ophthalmic solution Apply to eye. 11/01/20   [provider]  sodium chloride (OCEAN) 0.65 % nasal spray Place into the nose. 03/30/21   [provider]  SUMAtriptan (IMITREX) 25 MG tablet Take by mouth. Patient not taking: Reported on 08/19/2021 03/15/21   [provider]  thiamine 100 MG tablet Take 1 tablet by mouth daily. 03/31/21 03/31/22  [provider]  prochlorperazine (COMPAZINE) 10 MG tablet Take 1 tablet (10 mg total) by mouth every 6 (six) hours as needed (Nausea or vomiting). 09/08/20 12/28/20  Lequita Asal, MD    Family History  Problem Relation Age of Onset   Diabetes Brother      Social History   Tobacco Use   Smoking status: Former    Packs/day: 1.00    Years: 40.00    Pack years: 40.00    Types: 70, Cigarettes    Quit date: 08/12/2019    Years since quitting: 2.0   Smokeless tobacco: Never  Vaping Use   Vaping Use: Never used  Substance Use Topics   Alcohol use: Not Currently    Comment: H/O ETOH ABUSE BUT DENIES DRINKING DURING  08-11-20 INTERVIEW   Drug use: No    Allergies as of 09/15/2021 - Review Complete 07/22/2021  Allergen Reaction Noted   Propoxyphene  04/21/2015    Review of Systems:    All systems reviewed and negative except where noted in HPI.   Physical Exam:  There were no vitals taken for this visit. No LMP for male patient. General:   Alert,  Well-developed, well-nourished, pleasant and cooperative in NAD Abdomen: PEG tube was seen on exam.  The balloon was deflated and the PEG tube was withdrawn.  The area was draped prior to withdrawal and covered with a 4 x 4 and taped in place. Rectal:  Deferred. Skin:  Intact without significant lesions or rashes.  No jaundice.  Psych:  Alert and cooperative. Normal mood and affect.  Imaging Studies: No results found.  Assessment and Plan:   ACESON LABELL is a 75 y.o. y/o male who comes in today for a PEG tube removal.  The patient had his PEG tube removed without incident.  The patient will keep the area covered for the next 24 to 48 hours and has been told to contact me if any complications occur.    Lucilla Lame, MD. [facility]    Note: This dictation was prepared with Dragon dictation along with smaller phrase technology. Any transcriptional errors that result from this process are unintentional.

## 2021-10-26 ENCOUNTER — Other Ambulatory Visit: Payer: Self-pay

## 2021-10-26 ENCOUNTER — Other Ambulatory Visit: Payer: Medicare Other | Admitting: Primary Care

## 2021-11-18 ENCOUNTER — Other Ambulatory Visit: Payer: Self-pay

## 2021-11-18 ENCOUNTER — Other Ambulatory Visit: Payer: Medicare Other | Admitting: Primary Care

## 2021-11-18 DIAGNOSIS — Z515 Encounter for palliative care: Secondary | ICD-10-CM

## 2021-11-18 DIAGNOSIS — E440 Moderate protein-calorie malnutrition: Secondary | ICD-10-CM

## 2021-11-18 DIAGNOSIS — F17200 Nicotine dependence, unspecified, uncomplicated: Secondary | ICD-10-CM

## 2021-11-18 DIAGNOSIS — R634 Abnormal weight loss: Secondary | ICD-10-CM

## 2021-11-18 DIAGNOSIS — J449 Chronic obstructive pulmonary disease, unspecified: Secondary | ICD-10-CM

## 2021-11-18 DIAGNOSIS — R296 Repeated falls: Secondary | ICD-10-CM

## 2021-11-18 NOTE — Progress Notes (Signed)
[facility] Consult Note Telephone: [phone]  Fax: [phone]    Date of encounter: 11/18/21 1:48 PM PATIENT NAME: Kenneth Franklin [address] [place] [address]   [phone] (home)  DOB: 1946/03/01 MRN: [id number] PRIMARY CARE PROVIDER:    Verita Lamb, NP,  [address] [place] [address] [phone]  REFERRING PROVIDER:   Verita Lamb, NP [address] [place],  [place] [address] [phone]  RESPONSIBLE PARTY:    Contact Information     Name Relation Home Work [facility]) Daughter [phone]  [phone]   Smelcer,Darryl Brother [phone]  [phone]   [name] Niece   [phone]   Pamela, Maddy   [phone]        I met face to face with patient in  home. Palliative Care was asked to follow this patient by consultation request of  Verita Lamb, NP to address advance care planning and complex medical decision making. This is a follow up visit.                                   ASSESSMENT AND PLAN / RECOMMENDATIONS:   Advance Care Planning/Goals of Care: Goals include to maximize quality of life and symptom management. Our advance care planning conversation included a discussion about:    The value and importance of advance care planning  Experiences with loved ones who have been seriously ill or have died  Exploration of personal, cultural or spiritual beliefs that might influence medical decisions  Exploration of goals of care in the event of a sudden injury or illness  Identification of a healthcare agent - step daughter oversees FULL CODE  Symptom Management/Plan:  Mobility: Uses walker intermittently, high fall risk. We discussed him using walker and doing leg exercises daily to maximize his agility and fall prevention. Enjoys going outside when the weather is good.  Should do HEP to keep balance and endurance.   Sleep: Occ insomnia, has historically not  had problems. Endorses napping during day too much.   Mood: endorses concerns for finances, not celebrating christmas since his wife died 5 years ago.  Pain: Has bruises from falling. Feels it will resolve.  ADL: Much improved since d/c from SNF in 7/22. Has had ATC care givers. I feel he could d/c some of the hours with proper safety precautions such as consistent use of walker, and life line service. Gave name of [facility] med alert program.  Intake: 138 lbs, using high calorie nutritional supplements. Encouraged more fluids during they day. Endorses dry mouth at hs, recommended biotene.   Follow up Palliative Care Visit: Palliative care will continue to follow for complex medical decision making, advance care planning, and clarification of goals. Return 12 weeks or prn.  I spent 60 minutes providing this consultation. More than 50% of the time in this consultation was spent in counseling and care coordination.  PPS: 40%  HOSPICE ELIGIBILITY/DIAGNOSIS: no  Chief Complaint: weakness, falling  HISTORY OF PRESENT ILLNESS:  OBERON HEHIR is a 75 y.o. year old male  with COPD, tobacco use, depression, cognitive impairment, frequent falls, weakness .   History obtained from review of EMR, discussion with primary team, and interview with family, facility staff/caregiver and/or Kenneth Franklin.  I reviewed available labs, medications, imaging, studies and related documents from the EMR.  Records reviewed and summarized above.   ROS  General: NAD EYES: denies vision changes ENMT: denies dysphagia Cardiovascular: denies chest pain, denies DOE Pulmonary: denies cough, denies increased SOB Abdomen: endorses fair appetite, denies constipation, endorses continence of bowel GU: denies dysuria, endorses continence of urine MSK:  denies weakness,  + falls reported Skin: denies rashes or wounds, endorses back bruise Neurological: endorses s/p fall  pain,  endorses occ  insomnia Psych:  Endorses down mood Heme/lymph/immuno: denies bruises, abnormal bleeding  Physical Exam: Current and past weights: 138 lbs Constitutional: NAD General: frail appearing, thin EYES: anicteric sclera, lids intact, no discharge  ENMT: intact hearing, oral mucous membranes moist, dentition intact CV:  no LE edema Pulmonary: no increased work of breathing, no cough, room air Abdomen: intake 50%,  no ascites GU: deferred MSK: + sarcopenia, moves all extremities, ambulatory with walker  Skin: warm and dry, no rashes or wounds on visible skin Neuro:  + generalized weakness,  mild cognitive impairment Psych: non-anxious affect, A and O x 2-3 Hem/lymph/immuno: no widespread bruising  Thank you for the opportunity to participate in the care of Kenneth Franklin.  The palliative care team will continue to follow. Please call our office at [phone] if we can be of additional assistance.   Jason Coop, NP DNP, AGPCNP-BC  COVID-19 PATIENT SCREENING TOOL Asked and negative response unless otherwise noted:   Have you had symptoms of covid, tested positive or been in contact with someone with symptoms/positive test in the past 5-10 days?

## 2022-01-18 ENCOUNTER — Other Ambulatory Visit: Payer: Self-pay

## 2022-01-18 ENCOUNTER — Encounter: Payer: Self-pay | Admitting: Internal Medicine

## 2022-01-18 ENCOUNTER — Inpatient Hospital Stay: Payer: Medicare Other | Attending: Internal Medicine | Admitting: Internal Medicine

## 2022-01-18 DIAGNOSIS — Z7982 Long term (current) use of aspirin: Secondary | ICD-10-CM | POA: Diagnosis not present

## 2022-01-18 DIAGNOSIS — R682 Dry mouth, unspecified: Secondary | ICD-10-CM | POA: Diagnosis not present

## 2022-01-18 DIAGNOSIS — Z7984 Long term (current) use of oral hypoglycemic drugs: Secondary | ICD-10-CM | POA: Diagnosis not present

## 2022-01-18 DIAGNOSIS — C119 Malignant neoplasm of nasopharynx, unspecified: Secondary | ICD-10-CM

## 2022-01-18 DIAGNOSIS — Z85818 Personal history of malignant neoplasm of other sites of lip, oral cavity, and pharynx: Secondary | ICD-10-CM | POA: Insufficient documentation

## 2022-01-18 DIAGNOSIS — Z923 Personal history of irradiation: Secondary | ICD-10-CM | POA: Insufficient documentation

## 2022-01-18 DIAGNOSIS — Z79899 Other long term (current) drug therapy: Secondary | ICD-10-CM | POA: Insufficient documentation

## 2022-01-18 MED ORDER — CEVIMELINE HCL 30 MG PO CAPS: 30.0000 mg | ORAL_CAPSULE | Freq: Three times a day (TID) | ORAL | 6 refills | Status: DC

## 2022-01-18 NOTE — Assessment & Plan Note (Addendum)
#   T3NxMx/stage 3 nasopharyngeal carcinoma- unresectable. S/p 5 weeks of concurrent cisplatin and radiation [shortened x 3 fractions d/t mucositis]. Completed 10/24/20. 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   PET from 02/22/21 showed interval decrease in size and FDG uptake of mass of sphenoid sinus, posterior nasopharynx, and skull base. Patient declined adjuvant chemotherapy. [Dr.Engle].  Stable.  #Clinically evidence of recurrence.  However given the interruptions in therapies-I would recommend follow-up surveillance imaging PET scan at this time.  Patient does not want to travel to [place]; hence plan PET scan in approximately 2 months.  #History of Headache-. [ Dx: with bilateral pyogenic ethmoid sinusitis at [facility] which was drained on 4/18.  He was treated with multiple antibiotics and antifungals.  Pathology from 03/15/2021 biopsy showed atypical cells but negative for active malignancy] -currently resolved.  Await the PET scan.  #Xerostomia-moderate to severe cevemeline 3 times daily [if expensie;ve order salagen]  # Falls- last fall in July per patient. Balance and strength improving.   # Port-a-cath- s/p Explantation; # PEG tube- s/p Explantation  # DISPOSITION:  # follow up in 2 months- MD: labs- cbc.cmp; PET scan prior- Dr.B

## 2022-01-18 NOTE — Progress Notes (Signed)
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[place])   08/18/2020 Cancer Staging   Staging form: Pharynx - Nasopharynx, AJCC 8th Edition - Clinical stage from 08/18/2020: Stage III (cT3, cN0, cM0) - Signed by Lequita Asal, MD on 02/17/2021 Histopathologic type: Squamous cell carcinoma, keratinizing, NOS Stage prefix: Initial diagnosis ECOG performance status: Grade 1 Stage used in treatment planning: Yes National guidelines used in treatment planning: Yes Type of national guideline used in treatment planning: NCCN    08/24/2020 - 10/20/2020 Chemotherapy          12/31/2020 -  Chemotherapy    Patient is on Treatment Plan: HEAD/NECK NASOPHARYNGEAL ADJUVANT CISPLATIN D1 + 5FU IVCI D1-5 Q28D         INTERVAL HISTORY: in wheel chair; with step daughter.   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  Patient had a rough post chemoradiation course.  And declined adjuvant chemotherapy.  Patient states that he had recently seen ENT approximate 2 months ago.  Patient had been in rehab for many months.  He is currently at home.  He lives alone.  He has private sitters at home.  In the interim patient had his port taken out; also had his PEG tube taken out.  Continues to complain of dry mouth.   Review of Systems  Constitutional:  Positive for malaise/fatigue and weight loss. Negative for chills,  diaphoresis and fever.  HENT:  Negative for nosebleeds and sore throat.   Eyes:  Negative for double vision.  Respiratory:  Negative for cough, hemoptysis, sputum production, shortness of breath and wheezing.   Cardiovascular:  Negative for chest pain, palpitations, orthopnea and leg swelling.  Gastrointestinal:  Negative for abdominal pain, blood in stool, constipation, diarrhea, heartburn, melena, nausea and vomiting.  Genitourinary:  Negative for dysuria, frequency and urgency.  Musculoskeletal:  Positive for back pain and joint pain.  Skin: Negative.  Negative for itching and rash.  Neurological:  Negative for dizziness, tingling, focal weakness, weakness and headaches.  Endo/Heme/Allergies:  Does not bruise/bleed easily.  Psychiatric/Behavioral:  Negative for depression. The patient is not nervous/anxious and does not have insomnia.     ALLERGIES:  is allergic to propoxyphene.  MEDICATIONS:  Current Outpatient Medications  Medication Sig Dispense Refill   acetaminophen (TYLENOL) 325 MG tablet Take 975 mg by mouth in the morning, at noon, and at bedtime.     amLODipine (NORVASC) 5 MG tablet Take 1 tablet by mouth daily.     aspirin EC 81 MG tablet Take 81 mg by mouth daily. Swallow whole.     carBAMazepine (TEGRETOL) 100 MG/5ML suspension Take 10 mLs by mouth 2 (two) times daily.     cevimeline (EVOXAC) 30 MG capsule Take 1 capsule (30 mg total) by mouth 3 (three) times daily. 90 capsule 6   clopidogrel (PLAVIX) 75 MG tablet Take by mouth.     escitalopram (LEXAPRO) 20 MG tablet Take 20 mg by mouth every morning.      famotidine (PEPCID) 40 MG tablet Take by mouth.  folic acid (FOLVITE) 1 MG tablet Take 1 tablet by mouth daily.     gabapentin (NEURONTIN) 300 MG capsule Take by mouth.     glipiZIDE (GLUCOTROL) 5 MG tablet Take 1 tablet by mouth daily.     lansoprazole (PREVACID) 15 MG capsule Take 15 mg by mouth 2 (two) times daily.     losartan (COZAAR) 50 MG tablet Take 50 mg  by mouth daily.     metFORMIN (GLUCOPHAGE-XR) 500 MG 24 hr tablet Take 500 mg by mouth daily.     sodium chloride (OCEAN) 0.65 % nasal spray Place into the nose.     thiamine 100 MG tablet Take 1 tablet by mouth daily.     albuterol (PROVENTIL) (2.5 MG/3ML) 0.083% nebulizer solution PLEASE SEE ATTACHED FOR DETAILED DIRECTIONS (Patient not taking: Reported on 01/18/2022)     atorvastatin (LIPITOR) 80 MG tablet TAKE 1 TABLET (80 MG TOTAL) BY MOUTH NIGHTLY. (Patient not taking: Reported on 08/19/2021) 90 tablet 0   busPIRone (BUSPAR) 5 MG tablet Take 7.5 mg by mouth 2 (two) times daily. X 1 week, then 10 mg bid. (Patient not taking: Reported on 01/18/2022)     clonazePAM (KLONOPIN) 0.25 MG disintegrating tablet Take 1 tablet (0.25 mg total) by mouth 3 (three) times daily. (Patient not taking: Reported on 01/18/2022) 60 tablet 0   divalproex (DEPAKOTE ER) 250 MG 24 hr tablet Take 250-750 mg by mouth See admin instructions. Take 1 tablet (250mg ) by mouth at bedtime for 7 nights, then take 2 tablets (500mg ) by mouth at bedtime for 7 nights then take 3 tablets (750mg ) by mouth at bedtime (Patient not taking: Reported on 07/16/2021)     fluticasone (FLOVENT HFA) 110 MCG/ACT inhaler Inhale 2 puffs into the lungs daily as needed (shortness of breath). (Patient not taking: Reported on 01/18/2022)     HYDROcodone-acetaminophen (NORCO) 5-325 MG tablet Take 1 tablet by mouth every 6 (six) hours as needed for moderate pain. (Patient not taking: Reported on 04/21/2021) 20 tablet 0   mirtazapine (REMERON) 30 MG tablet Take 1 tablet (30 mg total) by mouth at bedtime. (Patient not taking: Reported on 04/21/2021) 30 tablet 0   Oxycodone HCl 10 MG TABS Take 10 mg by mouth every 6 (six) hours as needed (pain, moderate to severe). (Patient not taking: Reported on 01/18/2022)     polyethylene glycol powder (GLYCOLAX/MIRALAX) 17 GM/SCOOP powder Take by mouth. (Patient not taking: Reported on 07/16/2021)     polyvinyl alcohol (LIQUIFILM  TEARS) 1.4 % ophthalmic solution Apply to eye. (Patient not taking: Reported on 01/18/2022)     SUMAtriptan (IMITREX) 25 MG tablet Take by mouth. (Patient not taking: Reported on 01/18/2022)     No current facility-administered medications for this visit.   Facility-Administered Medications Ordered in Other Visits  Medication Dose Route Frequency Provider Last Rate Last Admin   sodium chloride flush (NS) 0.9 % injection 10 mL  10 mL Intravenous PRN Nolon Stalls C, MD   10 mL at 12/03/20 1138    PHYSICAL EXAMINATION:   Vitals:   01/18/22 1510  BP: 113/74  Pulse: 94  Temp: (!) 96.9 F (36.1 C)  SpO2: 96%   Filed Weights   01/18/22 1510  Weight: 133 lb (60.3 kg)    Physical Exam Vitals and nursing note reviewed.  HENT:     Head: Normocephalic and atraumatic.     Mouth/Throat:     Pharynx: Oropharynx is clear.  Eyes:     Extraocular Movements: Extraocular movements  intact.     Pupils: Pupils are equal, round, and reactive to light.  Cardiovascular:     Rate and Rhythm: Normal rate and regular rhythm.  Pulmonary:     Comments: Decreased breath sounds bilaterally.  Abdominal:     Palpations: Abdomen is soft.  Musculoskeletal:        General: Normal range of motion.     Cervical back: Normal range of motion.  Skin:    General: Skin is warm.  Neurological:     General: No focal deficit present.     Mental Status: He is alert and oriented to person, place, and time.  Psychiatric:        Behavior: Behavior normal.        Judgment: Judgment normal.     LABORATORY DATA:  I have reviewed the data as listed    Component Value Date/Time   NA 135 05/28/2021 0839   NA 137 12/15/2015 1056   K 4.1 05/28/2021 0839   CL 95 (L) 05/28/2021 0839   CO2 31 05/28/2021 0839   GLUCOSE 142 (H) 05/28/2021 0839   BUN 14 05/28/2021 0839   BUN 8 12/15/2015 1056   CREATININE 0.60 (L) 05/28/2021 0839   CREATININE 1.07 06/16/2016 1632   CALCIUM 9.6 05/28/2021 0839   PROT 7.2  03/13/2021 0723   PROT 6.5 12/15/2015 1056   ALBUMIN 3.2 (L) 03/13/2021 0723   ALBUMIN 4.2 12/15/2015 1056   AST 19 03/13/2021 0723   ALT 23 03/13/2021 0723   ALKPHOS 76 03/13/2021 0723   BILITOT 0.9 03/13/2021 0723   BILITOT 0.5 12/15/2015 1056   GFRNONAA >60 05/28/2021 0839   GFRNONAA 71 06/16/2016 1632   GFRAA >60 08/18/2020 1129   GFRAA 82 06/16/2016 1632    No results found for: SPEP, UPEP  Lab Results  Component Value Date   WBC 6.1 05/28/2021   NEUTROABS 20.0 (H) 03/13/2021   HGB 13.0 05/28/2021   HCT 39.8 05/28/2021   MCV 92.3 05/28/2021   PLT 368 05/28/2021      Chemistry      Component Value Date/Time   NA 135 05/28/2021 0839   NA 137 12/15/2015 1056   K 4.1 05/28/2021 0839   CL 95 (L) 05/28/2021 0839   CO2 31 05/28/2021 0839   BUN 14 05/28/2021 0839   BUN 8 12/15/2015 1056   CREATININE 0.60 (L) 05/28/2021 0839   CREATININE 1.07 06/16/2016 1632      Component Value Date/Time   CALCIUM 9.6 05/28/2021 0839   ALKPHOS 76 03/13/2021 0723   AST 19 03/13/2021 0723   ALT 23 03/13/2021 0723   BILITOT 0.9 03/13/2021 0723   BILITOT 0.5 12/15/2015 1056       No results found for: CA125, CEA, PSA, CA199, CA2729, AFP   RADIOGRAPHIC STUDIES: I have personally reviewed the radiological images as listed and agreed with the findings in the report. No results found.   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 S/p 5 weeks of concurrent cisplatin and radiation [shortened x 3 fractions d/t mucositis]. Completed 10/24/20. 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   PET from 02/22/21 showed interval decrease in size and FDG uptake of mass of sphenoid sinus, posterior nasopharynx, and skull base. Patient declined adjuvant chemotherapy. [Dr.Engle].  Stable.  #Clinically evidence of recurrence.  However given the interruptions in therapies-I would recommend follow-up  surveillance imaging PET scan at this time.  Patient does not  want to travel to [place]; hence plan PET scan in approximately 2 months.  #History of Headache-. [ Dx: with bilateral pyogenic ethmoid sinusitis at [facility] which was drained on 4/18.  He was treated with multiple antibiotics and antifungals.  Pathology from 03/15/2021 biopsy showed atypical cells but negative for active malignancy] -currently resolved.  Await the PET scan.   # Xerostomia-moderate to severe cevemeline 3 times daily [if expensie;ve order salagen]   # Falls- last fall in July per patient. Balance and strength improving.    # Port-a-cath- s/p Explantation; # PEG tube- s/p Explantation  # DISPOSITION:  # follow up in 2 months- MD: labs- cbc.cmp; PET scan prior- Dr.B   Orders Placed This Encounter  Procedures   NM PET Image Restage (PS) Skull Base to Thigh (F-18 FDG)    Standing Status:   Future    Standing Expiration Date:   01/18/2023    Order Specific Question:   If indicated for the ordered procedure, I authorize the administration of a radiopharmaceutical per Radiology protocol    Answer:   Yes    Order Specific Question:   Preferred imaging location?    Answer:   [facility]   CBC with Differential/Platelet    Standing Status:   Future    Standing Expiration Date:   01/18/2023   Comprehensive metabolic panel    Standing Status:   Future    Standing Expiration Date:   01/18/2023   All questions were answered. The patient knows to call the clinic with any problems, questions or concerns. No barriers to learning was detected.    Cammie Sickle, MD 01/18/2022 4:15 PM

## 2022-02-10 ENCOUNTER — Telehealth: Payer: Self-pay | Admitting: *Deleted

## 2022-02-10 NOTE — Telephone Encounter (Signed)
Otila Kluver, pt's Step daughter, called to inquire about the pet scan apt. This has not yet been scheduled Patient has an upcoming with Dr. Jacinto Reap on 4/25. Please call Otila Kluver to schedule pet prior to apt.  I reviewed chart- Order is in the computer. ?

## 2022-02-15 ENCOUNTER — Other Ambulatory Visit: Payer: Self-pay

## 2022-02-15 ENCOUNTER — Other Ambulatory Visit: Payer: Medicare Other | Admitting: Primary Care

## 2022-02-15 DIAGNOSIS — E440 Moderate protein-calorie malnutrition: Secondary | ICD-10-CM

## 2022-02-15 DIAGNOSIS — R296 Repeated falls: Secondary | ICD-10-CM

## 2022-02-15 DIAGNOSIS — Z515 Encounter for palliative care: Secondary | ICD-10-CM

## 2022-02-15 DIAGNOSIS — E43 Unspecified severe protein-calorie malnutrition: Secondary | ICD-10-CM

## 2022-02-15 DIAGNOSIS — R110 Nausea: Secondary | ICD-10-CM

## 2022-02-15 DIAGNOSIS — J449 Chronic obstructive pulmonary disease, unspecified: Secondary | ICD-10-CM

## 2022-02-15 DIAGNOSIS — E119 Type 2 diabetes mellitus without complications: Secondary | ICD-10-CM

## 2022-02-15 NOTE — Progress Notes (Signed)
? ? ?Manufacturing engineer ?[facility] Consult Note ?Telephone: [phone]  ?Fax: [phone]  ? ? ?Date of encounter: 02/15/22 ?1:42 PM ?PATIENT NAME: Kenneth [place] [address] [place] [address]   ?[phone] (home)  ?DOB: Dec 01, 1945 ?MRN: [id number] ?PRIMARY CARE PROVIDER:    ?Verita Lamb, NP,  ?[address] [place] [address] ?[phone] ? ?REFERRING PROVIDER:   ?Verita Lamb, NP ?[address] [place],  [place] [address] ?[phone] ? ?RESPONSIBLE PARTY:    ?Contact Information   ? ? Name Relation Home Work Mobile  ? Ernestina Patches [facility]) Daughter [phone]  [phone]  ? Sean,Darryl Brother [phone]  [phone]  ? [name]    [phone]  ? Tj, [place] Brother   [phone]  ? ?  ? ? ? ?I met face to face with patient in  home. Palliative Care was asked to follow this patient by consultation request of  Verita Lamb, NP to address advance care planning and complex medical decision making. This is a follow up visit. ? ?                                 ASSESSMENT AND PLAN / RECOMMENDATIONS:  ? ?Advance Care Planning/Goals of Care: Goals include to maximize quality of life and symptom management. Patient/health care surrogate gave his/her permission to discuss.Our advance care planning conversation included a discussion about:    ?Exploration of personal, cultural or spiritual beliefs that might influence medical decisions  ?Advocates for in place treatment, has in home sitters to assist with self care ?CODE STATUS: Full code ? ?Symptom Management/Plan: ? ?Dental health: Has lost teeth from  prior rad tx. To f/u with dentist, also mentioned [facility] dental clinic if needed ? ?Mobility: denies falls, endorses some strength.  Uses walker for outside, cane for inside. ? ?Nutrition: Endorses nausea, not eaten today. Drinks fluids, mainly tea and water. Albumin WNL ? ?Nausea: Has new PPI for nausea. Denies constipation,  denies vomiting.  Endorses eating light meal  and continue with fluids. States it has only been today. Has ondansetron and will use ODT for nausea. ? ?BG: endorses 172 as high, taking po anti glycemics ? ?Safety: Has life alert for end of month when sitters stop.  Uses urinal at hs. Reviewed safety in ambulation.Historically frequent falls but denies recent.  ? ?Follow up Palliative Care Visit: Palliative care will continue to follow for complex medical decision making, advance care planning, and clarification of goals. Return 8-12 weeks or prn. ? ?This visit was coded based on medical decision making (MDM). ? ?PPS: 40% ? ?HOSPICE ELIGIBILITY/DIAGNOSIS: no ? ?Chief Complaint: weakness, nausea ? ?HISTORY OF PRESENT ILLNESS:  Kenneth Franklin is a 76 y.o. year old male  with memory loss, weakness, nausea, no vomiting . Patient seen today to review palliative care needs to include medical decision making and advance care planning as appropriate.  ? ?History obtained from review of EMR, discussion with primary team, and interview with family, facility staff/caregiver and/or Kenneth Franklin.  ?I reviewed available labs, medications, imaging, studies and related documents from the EMR.  Records reviewed and summarized above.  ? ?ROS ? ?General: NAD ?EYES: denies vision changes ?ENMT: denies dysphagia, endorses lost teeth (3) ?Cardiovascular: denies chest pain, denies DOE ?Pulmonary: denies cough, denies increased SOB ?Abdomen: endorses poor  appetite, denies constipation, endorses continence of bowel ?GU: denies dysuria, endorses continence of urine ?MSK:  denies  increased weakness,  no  falls reported ?Skin: denies rashes or wounds ?Neurological: denies pain, denies insomnia ?Psych: Endorses positive mood ? ?Physical Exam: ?Current and past weights: 132 lb reported ?Constitutional: NAD ?General: frail appearing, thin ?EYES: anicteric sclera, lids intact, no discharge  ?ENMT: hard of hearing, oral mucous membranes moist, dentition lost  ?CV: RRR, no LE edema ?Pulmonary: no  increased work of breathing, no cough, room air ?Abdomen: intake 20%,soft and non tender, no ascites ?MSK: + sarcopenia, moves all extremities, ambulatory with cane ?Skin: warm and dry, no rashes or wounds on visible skin ?Neuro:  baseline generalized weakness,  mild to moderate  cognitive impairment, non-anxious affect ? ? ?Thank you for the opportunity to participate in the care of Kenneth Franklin.  The palliative care team will continue to follow. Please call our office at [phone] if we can be of additional assistance.  ? ?Jason Coop, NP DNP, AGPCNP-BC ? ?COVID-19 PATIENT SCREENING TOOL ?Asked and negative response unless otherwise noted:  ? ?Have you had symptoms of covid, tested positive or been in contact with someone with symptoms/positive test in the past 5-10 days?  ? ?

## 2022-03-16 ENCOUNTER — Other Ambulatory Visit (HOSPITAL_COMMUNITY): Payer: Medicare Other

## 2022-03-22 ENCOUNTER — Ambulatory Visit: Payer: Medicare Other | Admitting: Internal Medicine

## 2022-03-22 ENCOUNTER — Other Ambulatory Visit: Payer: Medicare Other

## 2022-04-18 ENCOUNTER — Ambulatory Visit (HOSPITAL_COMMUNITY): Payer: Medicare Other

## 2022-04-18 ENCOUNTER — Ambulatory Visit: Payer: Medicare Other

## 2022-04-21 ENCOUNTER — Other Ambulatory Visit: Payer: Medicare Other

## 2022-04-21 ENCOUNTER — Ambulatory Visit: Payer: Medicare Other | Admitting: Internal Medicine

## 2022-04-22 ENCOUNTER — Other Ambulatory Visit: Payer: Medicare Other

## 2022-04-22 ENCOUNTER — Ambulatory Visit: Payer: Medicare Other | Admitting: Internal Medicine

## 2022-05-03 ENCOUNTER — Other Ambulatory Visit: Payer: Medicare Other | Admitting: Primary Care

## 2022-05-03 DIAGNOSIS — R296 Repeated falls: Secondary | ICD-10-CM

## 2022-05-03 DIAGNOSIS — Z515 Encounter for palliative care: Secondary | ICD-10-CM

## 2022-05-03 DIAGNOSIS — E43 Unspecified severe protein-calorie malnutrition: Secondary | ICD-10-CM

## 2022-05-03 DIAGNOSIS — F17200 Nicotine dependence, unspecified, uncomplicated: Secondary | ICD-10-CM

## 2022-05-03 DIAGNOSIS — R4189 Other symptoms and signs involving cognitive functions and awareness: Secondary | ICD-10-CM

## 2022-05-03 DIAGNOSIS — R5381 Other malaise: Secondary | ICD-10-CM

## 2022-05-03 NOTE — Progress Notes (Signed)
[facility] Consult Note Telephone: [phone]  Fax: [phone]    Date of encounter: 05/03/22 3:06 PM PATIENT NAME: Kenneth Franklin [address] [place] [address]   [phone] (home)  DOB: [birth year] MRN: [id number] PRIMARY CARE PROVIDER:    Verita Lamb, NP,  [address] [place] [address] [phone]  REFERRING PROVIDER:   Verita Lamb, NP [address] [place],  [place] [address] [phone]  RESPONSIBLE PARTY:    Contact Information     Name Relation Home Work [facility]) Daughter [phone]  [phone]   Daft,Darryl Brother [phone]  [phone]   [name]    [phone]   Jolan, [place]   [phone]       I met face to face with patient  in  home. Palliative Care was asked to follow this patient by consultation request of  Verita Lamb, NP to address advance care planning and complex medical decision making. This is a follow up visit.                                   ASSESSMENT AND PLAN / RECOMMENDATIONS:   Advance Care Planning/Goals of Care: Goals include to maximize quality of life and symptom management. Patient/health care surrogate gave his/her permission to discuss.Our advance care planning conversation included a discussion about:     Experiences with loved ones who have been seriously ill or have died  Exploration of personal, cultural or spiritual beliefs that might influence medical decisions  Exploration of goals of care in the event of a sudden injury or illness  Identification of a healthcare agent - Step daughter Review of an  advance directive document - no changes CODE STATUS: FULL code  Symptom Management/Plan:   ADLs: No longer has helpers due to cost. Can drive  around town.  Can do light housekeeping.  Dyspnea: Breath sound diminished, DOE, endorses ongoing tobacco use disorder.  Mobility; Using cane and walker, has not falls  recently.   Safety: Has life alert now.it is $98 / month and we discussed cheaper options.  Xerostomia, endorses pain with biotene, has used  Stellalife advanced formula rinse, will ask step daughter to order. Available on March ARB.  Nutrition:Endorses anorexia ,Taking high cal nutrition supplements.  Has not gained weight in 1.5 years.  Encouraged to take high calorie ice cream as well.  Follow up Palliative Care Visit: Palliative care will continue to follow for complex medical decision making, advance care planning, and clarification of goals. Return 12 weeks or prn.  This visit was coded based on medical decision making (MDM).  PPS: 50%  HOSPICE ELIGIBILITY/DIAGNOSIS: TBD  Chief Complaint: debilities, memory loss, mucosititis  HISTORY OF PRESENT ILLNESS:  Kenneth Franklin is a 76 y.o. year old male  with h/o head and neck cancer, debility, falls, protein calorie malnutrition, mucosititis . Patient seen today to review palliative care needs to include medical decision making and advance care planning as appropriate.   History obtained from review of EMR, discussion with primary team, and interview with family, facility staff/caregiver and/or [name].  I reviewed available labs, medications, imaging, studies and related documents from the EMR.  Records reviewed and summarized above.   ROS   General: NAD ENMT: denies dysphagia, endorses oral pain Cardiovascular: denies chest pain, endorses  DOE Pulmonary: denies cough, denies increased SOB Abdomen: endorses fair  appetite, denies constipation, endorses  continence of bowel GU: denies dysuria, endorses continence of urine MSK:  denies  increased weakness,  no  recent falls reported Skin: denies rashes or wounds Neurological: denies pain, denies insomnia Psych: Endorses positive mood  although depressed about loss of health  Physical Exam: Current and past weights: 132 lbs which is stable Constitutional: NAD General: frail  appearing, thin EYES: anicteric sclera, lids intact, no discharge  ENMT: intact hearing, oral mucous membranes moist, dentition missing, xerostomia CV: S1S2, RRR, no LE edema Pulmonary:diminished all fields, + increased work of breathing, no cough, room air Abdomen: intake 50%,  no ascites MSK: + sarcopenia, moves all extremities, ambulatory with cane, walker  Skin: warm and dry, no rashes or wounds on visible skin Neuro:  no new  generalized weakness,  mild  cognitive impairment, anxious affect   Thank you for the opportunity to participate in the care of Mr. Oddo.  The palliative care team will continue to follow. Please call our office at [phone] if we can be of additional assistance.   Jason Coop, NP DNP, AGPCNP-BC  COVID-19 PATIENT SCREENING TOOL Asked and negative response unless otherwise noted:   Have you had symptoms of covid, tested positive or been in contact with someone with symptoms/positive test in the past 5-10 days?

## 2022-05-10 ENCOUNTER — Encounter
Admission: RE | Admit: 2022-05-10 | Discharge: 2022-05-10 | Disposition: A | Discharge status: Home / Self Care | Payer: Medicare Other | Source: Ambulatory Visit | Attending: Internal Medicine | Admitting: Internal Medicine

## 2022-05-10 DIAGNOSIS — C119 Malignant neoplasm of nasopharynx, unspecified: Secondary | ICD-10-CM | POA: Diagnosis present

## 2022-05-10 LAB — GLUCOSE, CAPILLARY: Glucose-Capillary: 120 mg/dL — ABNORMAL HIGH (ref 70–99)

## 2022-05-10 MED ORDER — FLUDEOXYGLUCOSE F - 18 (FDG) INJECTION
6.9000 | Freq: Once | INTRAVENOUS | Status: AC | PRN
  Administered 2022-05-10: 7.5 via INTRAVENOUS

## 2022-05-11 ENCOUNTER — Other Ambulatory Visit: Payer: Self-pay

## 2022-05-11 DIAGNOSIS — J3489 Other specified disorders of nose and nasal sinuses: Secondary | ICD-10-CM | POA: Insufficient documentation

## 2022-05-12 ENCOUNTER — Inpatient Hospital Stay: Payer: Medicare Other | Admitting: Internal Medicine

## 2022-05-12 ENCOUNTER — Other Ambulatory Visit: Payer: Self-pay

## 2022-05-12 ENCOUNTER — Encounter: Payer: Self-pay | Admitting: Internal Medicine

## 2022-05-12 ENCOUNTER — Inpatient Hospital Stay: Payer: Medicare Other | Attending: Internal Medicine

## 2022-05-12 VITALS — BP 117/77 | HR 77 | Temp 97.7°F | Resp 16 | Wt 138.8 lb

## 2022-05-12 DIAGNOSIS — K117 Disturbances of salivary secretion: Secondary | ICD-10-CM | POA: Insufficient documentation

## 2022-05-12 DIAGNOSIS — C119 Malignant neoplasm of nasopharynx, unspecified: Secondary | ICD-10-CM

## 2022-05-12 DIAGNOSIS — Z923 Personal history of irradiation: Secondary | ICD-10-CM | POA: Diagnosis not present

## 2022-05-12 DIAGNOSIS — J0100 Acute maxillary sinusitis, unspecified: Secondary | ICD-10-CM | POA: Diagnosis not present

## 2022-05-12 DIAGNOSIS — Z79899 Other long term (current) drug therapy: Secondary | ICD-10-CM | POA: Insufficient documentation

## 2022-05-12 LAB — COMPREHENSIVE METABOLIC PANEL
ALT: 8 U/L (ref 0–44)
AST: 17 U/L (ref 15–41)
Albumin: 3.6 g/dL (ref 3.5–5.0)
Alkaline Phosphatase: 108 U/L (ref 38–126)
Anion gap: 10 (ref 5–15)
BUN: <5 mg/dL — ABNORMAL LOW (ref 8–23)
CO2: 28 mmol/L (ref 22–32)
Calcium: 8.6 mg/dL — ABNORMAL LOW (ref 8.9–10.3)
Chloride: 98 mmol/L (ref 98–111)
Creatinine, Ser: 0.87 mg/dL (ref 0.61–1.24)
GFR, Estimated: >60 mL/min (ref >60)
Glucose, Bld: 249 mg/dL — ABNORMAL HIGH (ref 70–99)
Potassium: 4.2 mmol/L (ref 3.5–5.1)
Sodium: 136 mmol/L (ref 135–145)
Total Bilirubin: 0.6 mg/dL (ref 0.3–1.2)
Total Protein: 7.2 g/dL (ref 6.5–8.1)

## 2022-05-12 LAB — CBC WITH DIFFERENTIAL/PLATELET
Abs Immature Granulocytes: 0.04 10*3/uL (ref 0.00–0.07)
Basophils Absolute: 0.1 10*3/uL (ref 0.0–0.1)
Basophils Relative: 1 %
Eosinophils Absolute: 0.1 10*3/uL (ref 0.0–0.5)
Eosinophils Relative: 2 %
HCT: 42.9 % (ref 39.0–52.0)
Hemoglobin: 14 g/dL (ref 13.0–17.0)
Immature Granulocytes: 1 %
Lymphocytes Relative: 13 %
Lymphs Abs: 1.1 10*3/uL (ref 0.7–4.0)
MCH: 30.4 pg (ref 26.0–34.0)
MCHC: 32.6 g/dL (ref 30.0–36.0)
MCV: 93.3 fL (ref 80.0–100.0)
Monocytes Absolute: 0.7 10*3/uL (ref 0.1–1.0)
Monocytes Relative: 9 %
Neutro Abs: 6.1 10*3/uL (ref 1.7–7.7)
Neutrophils Relative %: 74 %
Platelets: 298 10*3/uL (ref 150–400)
RBC: 4.6 MIL/uL (ref 4.22–5.81)
RDW: 13.5 % (ref 11.5–15.5)
WBC: 8 10*3/uL (ref 4.0–10.5)
nRBC: 0 % (ref 0.0–0.2)

## 2022-05-12 MED ORDER — AMOXICILLIN-POT CLAVULANATE 875-125 MG PO TABS: 1.0000 | ORAL_TABLET | Freq: Two times a day (BID) | ORAL | 0 refills | Status: DC

## 2022-05-12 NOTE — Progress Notes (Signed)
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[place])   08/18/2020 Cancer Staging   Staging form: Pharynx - Nasopharynx, AJCC 8th Edition - Clinical stage from 08/18/2020: Stage III (cT3, cN0, cM0) - Signed by Lequita Asal, MD on 02/17/2021 Histopathologic type: Squamous cell carcinoma, keratinizing, NOS Stage prefix: Initial diagnosis ECOG performance status: Grade 1 Stage used in treatment planning: Yes National guidelines used in treatment planning: Yes Type of national guideline used in treatment planning: NCCN   08/24/2020 - 10/20/2020 Chemotherapy         12/31/2020 -  Chemotherapy    Patient is on Treatment Plan: HEAD/NECK NASOPHARYNGEAL ADJUVANT CISPLATIN D1 + 5FU IVCI D1-5 Q28D        INTERVAL HISTORY: in wheel chair; with step daughter.   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  Patient was recently evaluated by ENT.    Patient has a decreased appetite.  New pain right side nostril for the past 2 weeks, 4/10 pain scale.  Took Hydrocodone he had at home last night and it did help the pain.      He is currently at home.  He lives alone.  He has private sitters at home.  He does complain of headache.  Continues to complain of dry mouth.   Review of Systems  Constitutional:  Positive for  malaise/fatigue and weight loss. Negative for chills, diaphoresis and fever.  HENT:  Negative for nosebleeds and sore throat.   Eyes:  Negative for double vision.  Respiratory:  Negative for cough, hemoptysis, sputum production, shortness of breath and wheezing.   Cardiovascular:  Negative for chest pain, palpitations, orthopnea and leg swelling.  Gastrointestinal:  Negative for abdominal pain, blood in stool, constipation, diarrhea, heartburn, melena, nausea and vomiting.  Genitourinary:  Negative for dysuria, frequency and urgency.  Musculoskeletal:  Positive for back pain and joint pain.  Skin: Negative.  Negative for itching and rash.  Neurological:  Negative for dizziness, tingling, focal weakness, weakness and headaches.  Endo/Heme/Allergies:  Does not bruise/bleed easily.  Psychiatric/Behavioral:  Negative for depression. The patient is not nervous/anxious and does not have insomnia.      ALLERGIES:  is allergic to propoxyphene.  MEDICATIONS:  Current Outpatient Medications  Medication Sig Dispense Refill   acetaminophen (TYLENOL) 325 MG tablet Take 975 mg by mouth in the morning, at noon, and at bedtime.     amoxicillin-clavulanate (AUGMENTIN) 875-125 MG tablet Take 1 tablet by mouth 2 (two) times daily. 14 tablet 0   aspirin EC 81 MG tablet Take 81 mg by mouth daily. Swallow whole.     clopidogrel (PLAVIX) 75 MG tablet Take by mouth.     escitalopram (LEXAPRO) 20 MG tablet Take 20 mg by mouth every morning.      famotidine (PEPCID) 40 MG tablet Take by mouth.     gabapentin (NEURONTIN) 300 MG capsule Take by mouth.     glipiZIDE (GLUCOTROL) 5 MG tablet Take 1  tablet by mouth daily.     HYDROcodone-acetaminophen (NORCO) 5-325 MG tablet Take 1 tablet by mouth every 6 (six) hours as needed for moderate pain. 20 tablet 0   lansoprazole (PREVACID) 15 MG capsule Take 15 mg by mouth 2 (two) times daily.     losartan (COZAAR) 50 MG tablet Take 50 mg by mouth daily.     mupirocin  ointment (BACTROBAN) 2 % SMARTSIG:1 Application Topical 2-3 Times Daily     promethazine (PHENERGAN) 25 MG tablet Take 25 mg by mouth every 8 (eight) hours as needed.     sodium chloride (OCEAN) 0.65 % nasal spray Place into the nose.     albuterol (PROVENTIL) (2.5 MG/3ML) 0.083% nebulizer solution PLEASE SEE ATTACHED FOR DETAILED DIRECTIONS (Patient not taking: Reported on 01/18/2022)     atorvastatin (LIPITOR) 80 MG tablet TAKE 1 TABLET (80 MG TOTAL) BY MOUTH NIGHTLY. (Patient not taking: Reported on 08/19/2021) 90 tablet 0   busPIRone (BUSPAR) 5 MG tablet Take 7.5 mg by mouth 2 (two) times daily. X 1 week, then 10 mg bid. (Patient not taking: Reported on 01/18/2022)     carBAMazepine (TEGRETOL) 100 MG/5ML suspension Take 10 mLs by mouth 2 (two) times daily. (Patient not taking: Reported on 05/12/2022)     cevimeline (EVOXAC) 30 MG capsule Take 1 capsule (30 mg total) by mouth 3 (three) times daily. (Patient not taking: Reported on 05/12/2022) 90 capsule 6   clonazePAM (KLONOPIN) 0.25 MG disintegrating tablet Take 1 tablet (0.25 mg total) by mouth 3 (three) times daily. (Patient not taking: Reported on 01/18/2022) 60 tablet 0   divalproex (DEPAKOTE ER) 250 MG 24 hr tablet Take 250-750 mg by mouth See admin instructions. Take 1 tablet ('250mg'$ ) by mouth at bedtime for 7 nights, then take 2 tablets ('500mg'$ ) by mouth at bedtime for 7 nights then take 3 tablets ('750mg'$ ) by mouth at bedtime (Patient not taking: Reported on 07/16/2021)     fluticasone (FLOVENT HFA) 110 MCG/ACT inhaler Inhale 2 puffs into the lungs daily as needed (shortness of breath). (Patient not taking: Reported on 01/18/2022)     metFORMIN (GLUCOPHAGE-XR) 500 MG 24 hr tablet Take 500 mg by mouth daily. (Patient not taking: Reported on 05/12/2022)     mirtazapine (REMERON) 30 MG tablet Take 1 tablet (30 mg total) by mouth at bedtime. (Patient not taking: Reported on 04/21/2021) 30 tablet 0   Oxycodone HCl 10 MG TABS Take 10 mg by mouth every 6 (six)  hours as needed (pain, moderate to severe). (Patient not taking: Reported on 01/18/2022)     polyethylene glycol powder (GLYCOLAX/MIRALAX) 17 GM/SCOOP powder Take by mouth. (Patient not taking: Reported on 07/16/2021)     polyvinyl alcohol (LIQUIFILM TEARS) 1.4 % ophthalmic solution Apply to eye. (Patient not taking: Reported on 01/18/2022)     SUMAtriptan (IMITREX) 25 MG tablet Take by mouth. (Patient not taking: Reported on 01/18/2022)     No current facility-administered medications for this visit.   Facility-Administered Medications Ordered in Other Visits  Medication Dose Route Frequency Provider Last Rate Last Admin   sodium chloride flush (NS) 0.9 % injection 10 mL  10 mL Intravenous PRN Nolon Stalls C, MD   10 mL at 12/03/20 1138    PHYSICAL EXAMINATION:   Vitals:   05/12/22 1100  BP: 117/77  Pulse: 77  Resp: 16  Temp: 97.7 F (36.5 C)   Filed Weights   05/12/22 1100  Weight: 138 lb 12.8 oz (63 kg)    Physical  Exam Vitals and nursing note reviewed.  HENT:     Head: Normocephalic and atraumatic.     Mouth/Throat:     Pharynx: Oropharynx is clear.  Eyes:     Extraocular Movements: Extraocular movements intact.     Pupils: Pupils are equal, round, and reactive to light.  Cardiovascular:     Rate and Rhythm: Normal rate and regular rhythm.  Pulmonary:     Comments: Decreased breath sounds bilaterally.  Abdominal:     Palpations: Abdomen is soft.  Musculoskeletal:        General: Normal range of motion.     Cervical back: Normal range of motion.  Skin:    General: Skin is warm.  Neurological:     General: No focal deficit present.     Mental Status: He is alert and oriented to person, place, and time.  Psychiatric:        Behavior: Behavior normal.        Judgment: Judgment normal.      LABORATORY DATA:  I have reviewed the data as listed    Component Value Date/Time   NA 136 05/12/2022 1044   NA 137 12/15/2015 1056   K 4.2 05/12/2022 1044   CL 98  05/12/2022 1044   CO2 28 05/12/2022 1044   GLUCOSE 249 (H) 05/12/2022 1044   BUN <5 (L) 05/12/2022 1044   BUN 8 12/15/2015 1056   CREATININE 0.87 05/12/2022 1044   CREATININE 1.07 06/16/2016 1632   CALCIUM 8.6 (L) 05/12/2022 1044   PROT 7.2 05/12/2022 1044   PROT 6.5 12/15/2015 1056   ALBUMIN 3.6 05/12/2022 1044   ALBUMIN 4.2 12/15/2015 1056   AST 17 05/12/2022 1044   ALT 8 05/12/2022 1044   ALKPHOS 108 05/12/2022 1044   BILITOT 0.6 05/12/2022 1044   BILITOT 0.5 12/15/2015 1056   GFRNONAA >60 05/12/2022 1044   GFRNONAA 71 06/16/2016 1632   GFRAA >60 08/18/2020 1129   GFRAA 82 06/16/2016 1632    No results found for: "SPEP", "UPEP"  Lab Results  Component Value Date   WBC 8.0 05/12/2022   NEUTROABS 6.1 05/12/2022   HGB 14.0 05/12/2022   HCT 42.9 05/12/2022   MCV 93.3 05/12/2022   PLT 298 05/12/2022      Chemistry      Component Value Date/Time   NA 136 05/12/2022 1044   NA 137 12/15/2015 1056   K 4.2 05/12/2022 1044   CL 98 05/12/2022 1044   CO2 28 05/12/2022 1044   BUN <5 (L) 05/12/2022 1044   BUN 8 12/15/2015 1056   CREATININE 0.87 05/12/2022 1044   CREATININE 1.07 06/16/2016 1632      Component Value Date/Time   CALCIUM 8.6 (L) 05/12/2022 1044   ALKPHOS 108 05/12/2022 1044   AST 17 05/12/2022 1044   ALT 8 05/12/2022 1044   BILITOT 0.6 05/12/2022 1044   BILITOT 0.5 12/15/2015 1056       No results found for: "CA125", "CEA", "PSA", "CA199", "CA2729", "AFP"   RADIOGRAPHIC STUDIES: I have personally reviewed the radiological images as listed and agreed with the findings in the report. No results found.   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 S/p 5 weeks of concurrent cisplatin and radiation [shortened x 3 fractions d/t mucositis].  Patient declined adjuvant chemotherapy.  Completed 10/24/20. 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   #  JUNE 13th, 2023-  Marked reduced in size of  soft tissue mass centered in the sphenoid sinus as well as marked reduction in metabolic activity. Small focus of moderate metabolic activity does remain along the posterior RIGHT aspect of the sphenoid sinus.[Dr.Juengel].   #Clinically NO evidence of recurrence-based on current imaging.  Discussed with ENT.  Recommend continued clinical follow-up.  #Acute right maxillary sinusitis noted on imaging/symptoms.  Recommend Augmentin twice a day for 1 week.  Recommend follow-up with ENT after patient finishes course of antibiotics.  # Xerostomia-moderate to severe cevemeline 3 times daily-declines sec to expense.  I am concerned about side effects from salagen.  Continue over-the-counter salivary stimulants   # DISPOSITION:  # follow up in Jan end 2024- MD: labs- cbc.cmp;  Dr.B  # I reviewed the blood work- with the patient in detail; also reviewed the imaging independently [as summarized above]; and with the patient in detail.   Above plan of care was discussed with the patient's daughter Otila Kluver.     Orders Placed This Encounter  Procedures   CBC with Differential/Platelet    Standing Status:   Future    Standing Expiration Date:   05/13/2023   Comprehensive metabolic panel    Standing Status:   Future    Standing Expiration Date:   05/13/2023   All questions were answered. The patient knows to call the clinic with any problems, questions or concerns. No barriers to learning was detected.    Cammie Sickle, MD 05/12/2022 4:42 PM

## 2022-05-12 NOTE — Assessment & Plan Note (Addendum)
#   T3NxMx/stage 3 nasopharyngeal carcinoma- unresectable. S/p 5 weeks of concurrent cisplatin and radiation [shortened x 3 fractions d/t mucositis].  Patient declined adjuvant chemotherapy.  Completed 10/24/20. 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   # JUNE 13th, 2023-  Marked reduced in size of soft tissue mass centered in the sphenoid sinus as well as marked reduction in metabolic activity. Small focus of moderate metabolic activity does remain along the posterior RIGHT aspect of the sphenoid sinus.[Dr.Juengel].   #Clinically NO evidence of recurrence-based on current imaging.  Discussed with ENT.  Recommend continued clinical follow-up.  #Acute right maxillary sinusitis noted on imaging/symptoms.  Recommend Augmentin twice a day for 1 week.  Recommend follow-up with ENT after patient finishes course of antibiotics.  #Xerostomia-moderate to severe cevemeline 3 times daily-declines sec to expense.  I am concerned about side effects from salagen.  Continue over-the-counter salivary stimulants  # DISPOSITION:  # follow up in Jan end 2024- MD: labs- cbc.cmp;  Dr.B  # I reviewed the blood work- with the patient in detail; also reviewed the imaging independently [as summarized above]; and with the patient in detail.   Above plan of care was discussed with the patient's daughter Otila Kluver.

## 2022-05-12 NOTE — Progress Notes (Signed)
Patient has a decreased appetite.  New pain right side nostril for the past 2 weeks, 4/10 pain scale.  Took Hydrocodone he had at home last night and it did help the pain.    The pain was evaluated by PCP who thinks it could be an infection due to recent in office procedure by ENT Dr. Charolett Bumpers.  PCP sent in [place] ointment rx but hasn't started yet.

## 2022-06-20 ENCOUNTER — Other Ambulatory Visit: Payer: Self-pay

## 2022-07-09 ENCOUNTER — Emergency Department
Admission: EM | Admit: 2022-07-09 | Discharge: 2022-07-10 | Disposition: A | Discharge status: Home / Self Care | Payer: Medicare Other | Attending: Emergency Medicine | Admitting: Emergency Medicine

## 2022-07-09 ENCOUNTER — Other Ambulatory Visit: Payer: Self-pay

## 2022-07-09 DIAGNOSIS — E119 Type 2 diabetes mellitus without complications: Secondary | ICD-10-CM | POA: Insufficient documentation

## 2022-07-09 DIAGNOSIS — D72829 Elevated white blood cell count, unspecified: Secondary | ICD-10-CM | POA: Insufficient documentation

## 2022-07-09 DIAGNOSIS — I10 Essential (primary) hypertension: Secondary | ICD-10-CM | POA: Diagnosis not present

## 2022-07-09 DIAGNOSIS — R110 Nausea: Secondary | ICD-10-CM | POA: Diagnosis present

## 2022-07-09 DIAGNOSIS — R630 Anorexia: Secondary | ICD-10-CM | POA: Diagnosis not present

## 2022-07-09 LAB — COMPREHENSIVE METABOLIC PANEL
ALT: 10 U/L (ref 0–44)
AST: 19 U/L (ref 15–41)
Albumin: 4 g/dL (ref 3.5–5.0)
Alkaline Phosphatase: 94 U/L (ref 38–126)
Anion gap: 9 (ref 5–15)
BUN: 11 mg/dL (ref 8–23)
CO2: 25 mmol/L (ref 22–32)
Calcium: 9.4 mg/dL (ref 8.9–10.3)
Chloride: 99 mmol/L (ref 98–111)
Creatinine, Ser: 0.86 mg/dL (ref 0.61–1.24)
GFR, Estimated: >60 mL/min (ref >60)
Glucose, Bld: 153 mg/dL — ABNORMAL HIGH (ref 70–99)
Potassium: 4.6 mmol/L (ref 3.5–5.1)
Sodium: 133 mmol/L — ABNORMAL LOW (ref 135–145)
Total Bilirubin: 0.6 mg/dL (ref 0.3–1.2)
Total Protein: 7.7 g/dL (ref 6.5–8.1)

## 2022-07-09 LAB — CBC
HCT: 44.4 % (ref 39.0–52.0)
Hemoglobin: 14.4 g/dL (ref 13.0–17.0)
MCH: 29.2 pg (ref 26.0–34.0)
MCHC: 32.4 g/dL (ref 30.0–36.0)
MCV: 90.1 fL (ref 80.0–100.0)
Platelets: 342 10*3/uL (ref 150–400)
RBC: 4.93 MIL/uL (ref 4.22–5.81)
RDW: 12.6 % (ref 11.5–15.5)
WBC: 11.1 10*3/uL — ABNORMAL HIGH (ref 4.0–10.5)
nRBC: 0 % (ref 0.0–0.2)

## 2022-07-09 LAB — LIPASE, BLOOD: Lipase: 23 U/L (ref 11–51)

## 2022-07-09 MED ORDER — LACTATED RINGERS IV BOLUS
1000.0000 mL | Freq: Once | INTRAVENOUS | Status: AC
  Administered 2022-07-09: 1000 mL via INTRAVENOUS

## 2022-07-09 MED ORDER — ONDANSETRON 4 MG PO TBDP
4.0000 mg | ORAL_TABLET | Freq: Once | ORAL | Status: AC
  Administered 2022-07-09: 4 mg via ORAL
  Filled 2022-07-09: qty 1

## 2022-07-09 MED ORDER — METOCLOPRAMIDE HCL 5 MG/ML IJ SOLN
10.0000 mg | Freq: Once | INTRAMUSCULAR | Status: AC
  Administered 2022-07-09: 10 mg via INTRAVENOUS
  Filled 2022-07-09: qty 2

## 2022-07-09 NOTE — ED Provider Notes (Signed)
[facility] Provider Note    Event Date/Time   First MD Initiated Contact with Patient 07/09/22 2005     (approximate)  History   Chief Complaint: Abdominal Pain  HPI  Kenneth Franklin is a 76 y.o. male with a past medical history of prior CVA, prior alcohol use, diabetes, hypertension, hyperlipidemia, presents to the emergency department for decreased appetite and concerns for dehydration.  According to the patient for the last 6 months to a year he has been experiencing decreased appetite and a sensation of nausea.  They state they have seen their doctor several times for the same has tried various medications including Marinol, Zofran, Reglan but the patient continues to feel nauseated.  Patient states he does not vomit however he just feels nauseated so he does not eat.  Daughter is here with the patient states patient has not eaten anything for about 3 days now he does continue to drink fluid during the day small cans of tea per daughter.  Patient denies any abdominal pain to myself.  States this has been ongoing issue, but daughter was concerned for possible dehydration.  Physical Exam   Triage Vital Signs: ED Triage Vitals  Enc Vitals Group     BP 07/09/22 1456 100/67     Pulse Rate 07/09/22 1456 87     Resp 07/09/22 1456 16     Temp 07/09/22 1456 97.7 F (36.5 C)     Temp Source 07/09/22 1946 Oral     SpO2 07/09/22 1456 93 %     Weight 07/09/22 1457 131 lb (59.4 kg)     Height 07/09/22 1457 6' (1.829 m)     Head Circumference --      Peak Flow --      Pain Score 07/09/22 1457 5     Pain Loc --      Pain Edu? --      Excl. in [place]? --     Most recent vital signs: Vitals:   07/09/22 1456 07/09/22 1946  BP: 100/67 124/74  Pulse: 87 95  Resp: 16 16  Temp: 97.7 F (36.5 C) 97.6 F (36.4 C)  SpO2: 93% 97%    General: Awake, no distress.  CV:  Good peripheral perfusion.  Regular rate and rhythm  Resp:  Normal effort.  Equal breath sounds  bilaterally.  Abd:  No distention.  Soft, nontender.  No rebound or guarding.  Benign abdomen.   ED Results / Procedures / Treatments   MEDICATIONS ORDERED IN ED: Medications  metoCLOPramide (REGLAN) injection 10 mg (has no administration in time range)  ondansetron (ZOFRAN-ODT) disintegrating tablet 4 mg (has no administration in time range)  lactated ringers bolus 1,000 mL (has no administration in time range)     IMPRESSION / MDM / ASSESSMENT AND PLAN / ED COURSE  I reviewed the triage vital signs and the nursing notes.  Patient's presentation is most consistent with acute presentation with potential threat to life or bodily function.  Patient presents emergency department for decreased appetite, nausea and concerns for dehydration.  Here the patient appears well benign abdomen no tenderness to palpation.  Reassuring vitals.  Lab work does show slight leukocytosis 11,000, reassuring chemistry and a normal lipase.  However given the patient's decreased oral intake over the last few days we will treat with oral Zofran, IV Reglan, IV lactated Ringer's and continue to closely monitor.  Daughter agreeable to plan of care.  Given the patient's benign abdomen I  do not believe CT imaging would benefit the patient.  Patient is feeling a little better after medications and fluids.  We will attempt to feed see how the patient does with oral intake.  Patient was able to eat part of a sandwich and drink water.  They have Reglan and Zofran to use at home already and they have a follow-up appointment with her PCP in 2 weeks.  Patient will be discharged home.  FINAL CLINICAL IMPRESSION(S) / ED DIAGNOSES   Nausea   Note:  This document was prepared using Dragon voice recognition software and may include unintentional dictation errors.   Harvest Dark, MD 07/09/22 2318

## 2022-07-09 NOTE — ED Triage Notes (Signed)
Pt to ED for lower abd pain and nausea for past several months. Denies fevers  Pt was seen at dr Thursday, given phenergan and reglan with no relief.  Sodium 131 two days ago.  Reports pain has worsened.

## 2022-07-09 NOTE — ED Triage Notes (Signed)
Pt in via EMS from home with c/o abd pain for 3 days. Pt also has not had a BM for 3 days or feed to eat. Pt reports nauseated for weeks.

## 2022-07-29 ENCOUNTER — Other Ambulatory Visit: Payer: Self-pay

## 2022-08-08 ENCOUNTER — Other Ambulatory Visit: Payer: Medicare Other | Admitting: Primary Care

## 2022-08-09 ENCOUNTER — Other Ambulatory Visit: Payer: Medicare Other | Admitting: Primary Care

## 2022-08-09 DIAGNOSIS — R110 Nausea: Secondary | ICD-10-CM

## 2022-08-09 DIAGNOSIS — R634 Abnormal weight loss: Secondary | ICD-10-CM

## 2022-08-09 DIAGNOSIS — J449 Chronic obstructive pulmonary disease, unspecified: Secondary | ICD-10-CM

## 2022-08-09 DIAGNOSIS — E43 Unspecified severe protein-calorie malnutrition: Secondary | ICD-10-CM

## 2022-08-09 DIAGNOSIS — Z515 Encounter for palliative care: Secondary | ICD-10-CM

## 2022-08-09 NOTE — Progress Notes (Signed)
[facility] Consult Note Telephone: [phone]  Fax: [phone]    Date of encounter: 08/09/22 2:00 PM PATIENT NAME: Kenneth Franklin [address] [place] [address]   [phone] (home)  DOB: 03/22/1946 MRN: [id number] PRIMARY CARE PROVIDER:    Verita Lamb, NP,  [address] [place] [address] [phone]  REFERRING PROVIDER:   Verita Lamb, NP [address] [place],  [place] [address] [phone]  RESPONSIBLE PARTY:    Contact Information     Name Relation Home Work [place] [facility]) Daughter [phone]  [phone]   Curless,Darryl Brother [phone]  [phone]   [name]    [phone]   Caetano, [place]   [phone]        I met face to face with patient in  home.  Palliative Care was asked to follow this patient by consultation request of  Verita Lamb, NP to address advance care planning and complex medical decision making. This is a follow up visit.                                   ASSESSMENT AND PLAN / RECOMMENDATIONS:   Advance Care Planning/Goals of Care: Goals include to maximize quality of life and symptom management. Patient/health care surrogate gave his/her permission to discuss.Our advance care planning conversation included a discussion about:    The value and importance of advance care planning  Experiences with loved ones who have been seriously ill or have died  Exploration of personal, cultural or spiritual beliefs that might influence medical decisions  Exploration of goals of care in the event of a sudden injury or illness  Discussed care options, he is worried about money but likely needs worker several days a week. We discussed need to prevent decline vs present to ED with advance issues. Identification of a healthcare agent - daughter CODE STATUS: FULL  Symptom Management/Plan:  Nausea: has been intractable. Some relief with metoclopramide but has very  distressing xerostomia. Suggest Biotene rinses, there is a gentle version or can compound at local pharmacy.  Constipation: Endorses profound. Has not been worried due to poor po intake. I've given him a protocol of miralax and senna as well as pro biotics to start daily. Also increase fruits / veg juices if he can tolerate drinks better than food. Has gone days with no solid food and appears weak.  Nutrition: Poor as above, has supplements but not eating solid food. Despairs to wait for GI for > 3 months.   Mobility: Has had some falls from weakness, endorses legs giving out. Discussed him taking water to bed so he does not have to get up at hs. Also has a walker  Smoking cessation: Reviewed, he states he does want to quit.  Follow up Palliative Care Visit: Palliative care will continue to follow for complex medical decision making, advance care planning, and clarification of goals. Return 4 weeks or prn.  I spent 60 minutes providing this consultation. More than 50% of the time in this consultation was spent in counseling and care coordination.  This visit was coded based on medical decision making (MDM).  PPS: 40%  HOSPICE ELIGIBILITY/DIAGNOSIS: TBD  Chief Complaint: nausea, wt loss, debility  HISTORY OF PRESENT ILLNESS:  Kenneth Franklin is a 76 y.o. year old male  with h/o cancer, copd, debility,wt loss nausea without vomiting . Patient seen today to review  palliative care needs to include medical decision making and advance care planning as appropriate.   History obtained from review of EMR, discussion with primary team, and interview with family, facility staff/caregiver and/or Kenneth Franklin.  I reviewed available labs, medications, imaging, studies and related documents from the EMR.  Records reviewed and summarized above.   ROS   General: NAD ENMT: denies dysphagia, endorses xerostomia Cardiovascular: denies chest pain, denies DOE Pulmonary: denies cough, denies increased  SOB Abdomen: endorses  poor appetite, endorses severe constipation, endorses continence of bowel GU: denies dysuria, endorses continence of urine MSK:  denies  increased weakness,  no falls reported Skin: denies rashes or wounds Neurological: denies pain, denies insomnia Psych: Endorses depressed mood  Physical Exam: Current and past weights:unavailable but subjective loss Constitutional: NAD General: frail appearing, thin EYES: anicteric sclera, lids intact, no discharge  ENMT: hard of hearing, oral mucous membranes dry, and red but no thrush CV:  RRR, no LE edema Pulmonary: no increased work of breathing, no cough, room air Abdomen: intake <30%, soft and non tender, no ascites MSK: ++ sarcopenia, moves all extremities, ambulatory with walker  Skin: warm and dry, no rashes or wounds on visible skin Neuro:  +generalized weakness,  no cognitive impairment, anxious affect   Thank you for the opportunity to participate in the care of Kenneth Franklin.  The palliative care team will continue to follow. Please call our office at [phone] if we can be of additional assistance.   Jason Coop, NP DNP, AGPCNP-BC  COVID-19 PATIENT SCREENING TOOL Asked and negative response unless otherwise noted:   Have you had symptoms of covid, tested positive or been in contact with someone with symptoms/positive test in the past 5-10 days?

## 2022-08-15 ENCOUNTER — Other Ambulatory Visit: Payer: Self-pay | Admitting: Gastroenterology

## 2022-08-15 DIAGNOSIS — R112 Nausea with vomiting, unspecified: Secondary | ICD-10-CM

## 2022-08-16 ENCOUNTER — Ambulatory Visit
Admission: RE | Admit: 2022-08-16 | Discharge: 2022-08-16 | Disposition: A | Discharge status: Home / Self Care | Payer: Medicare Other | Source: Ambulatory Visit | Attending: Gastroenterology | Admitting: Gastroenterology

## 2022-08-16 ENCOUNTER — Ambulatory Visit: Payer: Medicare Other

## 2022-08-16 DIAGNOSIS — R112 Nausea with vomiting, unspecified: Secondary | ICD-10-CM | POA: Insufficient documentation

## 2022-08-16 MED ORDER — IOHEXOL 300 MG/ML  SOLN
100.0000 mL | Freq: Once | INTRAMUSCULAR | Status: AC | PRN
Start: 2022-08-16 — End: 2022-08-16
  Administered 2022-08-16: 100 mL via INTRAVENOUS

## 2022-08-18 ENCOUNTER — Other Ambulatory Visit: Payer: Medicare Other | Admitting: Primary Care

## 2022-08-18 DIAGNOSIS — F339 Major depressive disorder, recurrent, unspecified: Secondary | ICD-10-CM

## 2022-08-18 DIAGNOSIS — R4189 Other symptoms and signs involving cognitive functions and awareness: Secondary | ICD-10-CM

## 2022-08-18 DIAGNOSIS — R110 Nausea: Secondary | ICD-10-CM

## 2022-08-18 DIAGNOSIS — R5381 Other malaise: Secondary | ICD-10-CM

## 2022-08-18 NOTE — Progress Notes (Addendum)
[facility] Consult Note Telephone: [phone]  Fax: [phone]    Date of encounter: 08/18/22 11:00 AM PATIENT NAME: Kenneth Franklin [address] [place] [address]   [phone] (home)  DOB: 1946/11/26 MRN: [id number] PRIMARY CARE PROVIDER:    Verita Lamb, NP,  [address] [place] [address] [phone]  REFERRING PROVIDER:   Verita Lamb, NP [address] [place],  [place] [address] [phone]  RESPONSIBLE PARTY:    Contact Information     Name Relation Home Work [place] ([place] Daughter [phone]  [phone]   Juniel,Darryl Brother [phone]  [phone]   [name]    [phone]   Pape, Parson   [phone]        Due to the COVID-19 crisis, this visit was done via telemedicine from my office and it was initiated and consent by this patient and or family.  I connected with  Kenneth Franklin OR PROXY on 08/18/22 by a audio  enabled telemedicine application and verified that I am speaking with the correct person using two identifiers.   I discussed the limitations of evaluation and management by telemedicine. The patient expressed understanding and agreed to proceed.  Palliative Care was asked to follow this patient by consultation request of  Verita Lamb, NP to address advance care planning and complex medical decision making. This is a follow up visit.                                   ASSESSMENT AND PLAN / RECOMMENDATIONS:   Advance Care Planning/Goals of Care: Goals include to maximize quality of life and symptom management.   Symptom Management/Plan:  Mood; Will wean off Lexapro and buspar, and start cymbalta 30 mg after wean. Patient has been on lexapro for some years, and has increased in depression symptoms. Buspar seems to have had no effect.  Education given to daughter to wean then begin cymbalta in 2 weeks, and will reassess in 4-6 weeks for effect and  increase if needed. Reviewed recent labs.  Med review: Has not take any other psychotropics than the above for some time. He has recently been started on oxycodone 5 mg bid prn for abdominal pain  Nausea: states he does not want dry mouth and will not take metoclopromide. Daughter states I told I'm to stop it; I told him it could lead to dry mouth and gave him several otc preparations for this. I encouraged him via his POA to restart this medication especially in light of PCP starting oxycodone.  Follow up Palliative Care Visit: Palliative care will continue to follow for complex medical decision making, advance care planning, and clarification of goals. Return 4-6 weeks or prn.  I spent 30 minutes providing this consultation. More than 50% of the time in this consultation was spent in counseling and care coordination.  PPS: 40%  HOSPICE ELIGIBILITY/DIAGNOSIS: TBD  Chief Complaint: depression  HISTORY OF PRESENT ILLNESS:  Kenneth Franklin is a 76 y.o. year old male  with depression, DM, abdominal pain . Patient seen today to review palliative care needs to include medical decision making and advance care planning as appropriate.   History obtained from review of EMR, discussion with primary team, and interview with family, facility staff/caregiver and/or Kenneth Franklin.  I reviewed available labs, medications, imaging, studies and related documents from the EMR.  Records reviewed and summarized above.  ROS  /family report  ENMT: denies dysphagia, endorse xerostomia Abdomen: endorses poor appetite, denies constipation, endorses continence of bowel GU: denies dysuria, endorses continence of urine MSK:  endorses  increased weakness,  no falls reported Skin: denies rashes or wounds Neurological: endorses abd pain, denies insomnia Psych: Endorses depressed mood  Physical Exam: deferred  Outpatient Encounter Medications as of 08/18/2022  Medication Sig   acetaminophen (TYLENOL) 325 MG  tablet Take 975 mg by mouth every 6 (six) hours as needed.   albuterol (PROVENTIL) (2.5 MG/3ML) 0.083% nebulizer solution    aspirin EC 81 MG tablet Take 81 mg by mouth daily. Swallow whole.   Cholecalciferol (QC VITAMIN D3) 50 MCG (2000 UT) CAPS Take by mouth.   clopidogrel (PLAVIX) 75 MG tablet Take 75 mg by mouth every morning.   escitalopram (LEXAPRO) 20 MG tablet Take 20 mg by mouth every morning.    famotidine (PEPCID) 40 MG tablet Take 40 mg by mouth daily.   fluticasone (FLONASE) 50 MCG/ACT nasal spray Place 2 sprays into the nose daily.   folic acid (FOLVITE) 1 MG tablet Take 1 mg by mouth daily.   glipiZIDE (GLUCOTROL) 5 MG tablet Take 1 tablet by mouth daily.   losartan (COZAAR) 100 MG tablet Take 100 mg by mouth daily.   ondansetron (ZOFRAN-ODT) 8 MG disintegrating tablet Take 8 mg by mouth every 8 (eight) hours as needed.   oxycodone (OXY-IR) 5 MG capsule Take 5 mg by mouth 2 (two) times daily as needed.   pantoprazole (PROTONIX) 40 MG tablet Take 40 mg by mouth daily.   polyethylene glycol powder (GLYCOLAX/MIRALAX) 17 GM/SCOOP powder Take by mouth.   polyvinyl alcohol (LIQUIFILM TEARS) 1.4 % ophthalmic solution Apply to eye.   sodium chloride (OCEAN) 0.65 % nasal spray Place into the nose.   atorvastatin (LIPITOR) 80 MG tablet TAKE 1 TABLET (80 MG TOTAL) BY MOUTH NIGHTLY. (Patient not taking: Reported on 08/19/2021)   metoCLOPramide (REGLAN) 5 MG tablet Take 5 mg by mouth 3 (three) times daily. (Patient not taking: Reported on 08/18/2022)   [DISCONTINUED] amLODipine (NORVASC) 5 MG tablet Take 5 mg by mouth daily.   [DISCONTINUED] amoxicillin-clavulanate (AUGMENTIN) 875-125 MG tablet Take 1 tablet by mouth 2 (two) times daily. (Patient not taking: Reported on 08/09/2022)   [DISCONTINUED] busPIRone (BUSPAR) 5 MG tablet Take 7.5 mg by mouth 2 (two) times daily. X 1 week, then 10 mg bid.   [DISCONTINUED] carBAMazepine (TEGRETOL) 100 MG/5ML suspension Take 10 mLs by mouth 2 (two) times  daily. (Patient not taking: Reported on 05/12/2022)   [DISCONTINUED] cevimeline (EVOXAC) 30 MG capsule Take 1 capsule (30 mg total) by mouth 3 (three) times daily. (Patient not taking: Reported on 05/12/2022)   [DISCONTINUED] clonazePAM (KLONOPIN) 0.25 MG disintegrating tablet Take 1 tablet (0.25 mg total) by mouth 3 (three) times daily. (Patient not taking: Reported on 01/18/2022)   [DISCONTINUED] divalproex (DEPAKOTE ER) 250 MG 24 hr tablet Take 250-750 mg by mouth See admin instructions. Take 1 tablet ('250mg'$ ) by mouth at bedtime for 7 nights, then take 2 tablets ('500mg'$ ) by mouth at bedtime for 7 nights then take 3 tablets ('750mg'$ ) by mouth at bedtime (Patient not taking: Reported on 08/09/2022)   [DISCONTINUED] fluticasone (FLOVENT HFA) 110 MCG/ACT inhaler Inhale 2 puffs into the lungs daily as needed (shortness of breath).   [DISCONTINUED] HYDROcodone-acetaminophen (NORCO) 5-325 MG tablet Take 1 tablet by mouth every 6 (six) hours as needed for moderate pain.   [DISCONTINUED] lansoprazole (PREVACID) 15 MG capsule Take  15 mg by mouth 2 (two) times daily.   [DISCONTINUED] metFORMIN (GLUCOPHAGE-XR) 500 MG 24 hr tablet Take 500 mg by mouth daily. (Patient not taking: Reported on 05/12/2022)   [DISCONTINUED] mirtazapine (REMERON) 30 MG tablet Take 1 tablet (30 mg total) by mouth at bedtime. (Patient not taking: Reported on 04/21/2021)   [DISCONTINUED] mupirocin ointment (BACTROBAN) 2 % SMARTSIG:1 Application Topical 2-3 Times Daily (Patient not taking: Reported on 08/09/2022)   [DISCONTINUED] Oxycodone HCl 10 MG TABS Take 10 mg by mouth every 6 (six) hours as needed (pain, moderate to severe). (Patient not taking: Reported on 01/18/2022)   [DISCONTINUED] prochlorperazine (COMPAZINE) 10 MG tablet Take 1 tablet (10 mg total) by mouth every 6 (six) hours as needed (Nausea or vomiting).   [DISCONTINUED] promethazine (PHENERGAN) 25 MG tablet Take 25 mg by mouth every 8 (eight) hours as needed.   [DISCONTINUED]  SUMAtriptan (IMITREX) 25 MG tablet Take by mouth. (Patient not taking: Reported on 01/18/2022)   Facility-Administered Encounter Medications as of 08/18/2022  Medication   sodium chloride flush (NS) 0.9 % injection 10 mL    Thank you for the opportunity to participate in the care of Mr. Beasley.  The palliative care team will continue to follow. Please call our office at [phone] if we can be of additional assistance.   Jason Coop, NP DNP, AGPCNP-BC

## 2022-08-28 IMAGING — CT CT HEAD W/O CM
4 series · 16 of 47 positions shown, 18 images · non-contrast
Comparison: PET-CT February 14, 2021

CLINICAL DATA: Headache, history of nasopharyngeal cancer

EXAM:
CT HEAD WITHOUT CONTRAST
TECHNIQUE: Contiguous axial images were obtained from the base of the skull
through the vertex without intravenous contrast.

[Series 2: head wo · axial · 0.44mm/px · z∈[-98,+22]mm · 7 of 32 slices shown, 9 images]
[im 4/32  brain]
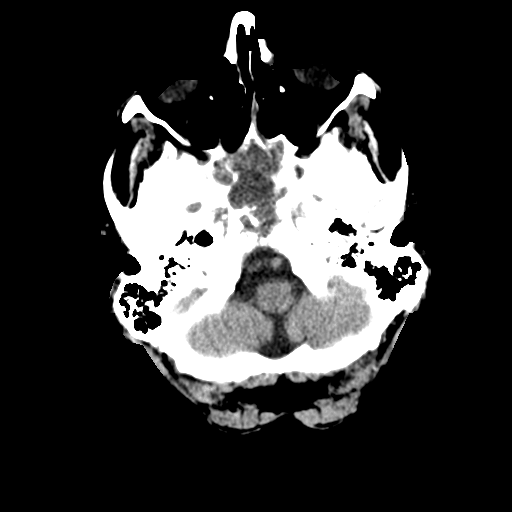
[im 4/32  bone]
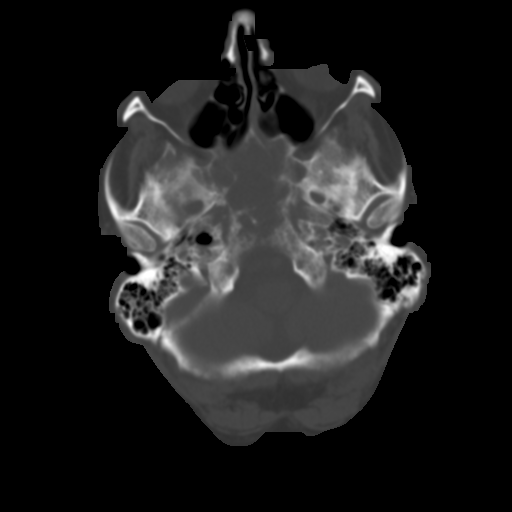
[im 8/32  brain]
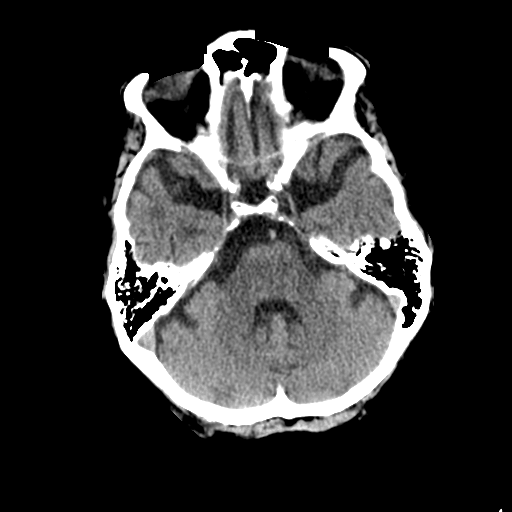
[im 12/32  brain]
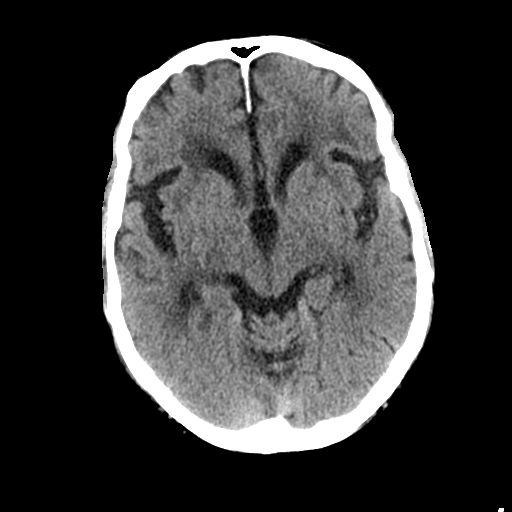
[im 16/32  brain]
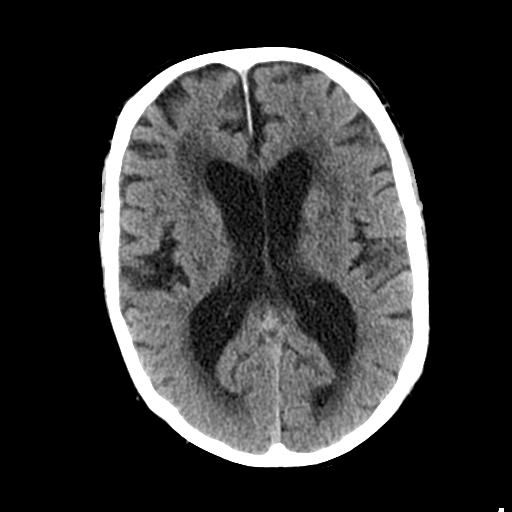
[im 20/32  brain]
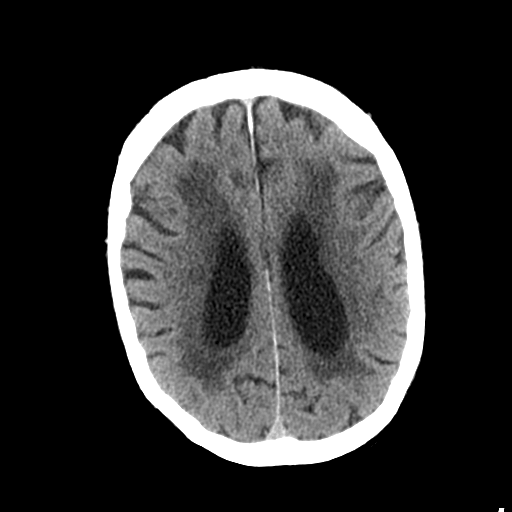
[im 20/32  bone]
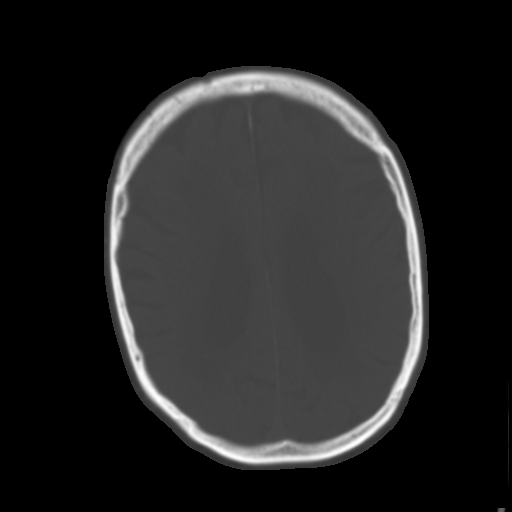
[im 24/32  brain]
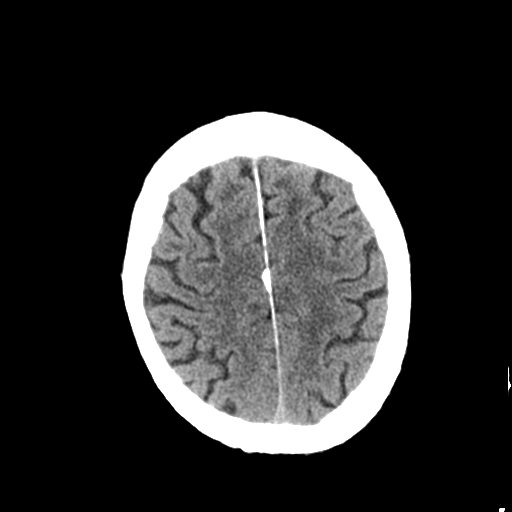
[im 28/32  brain]
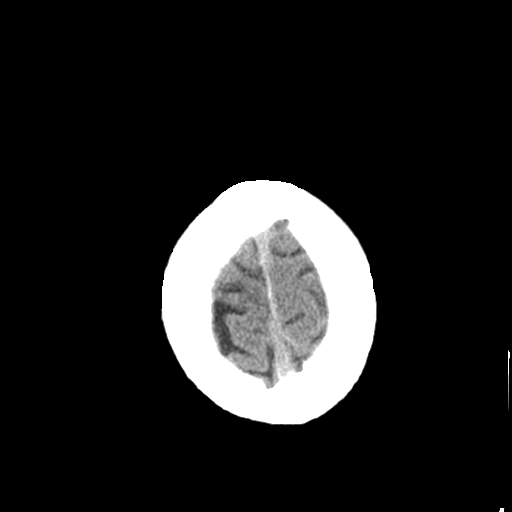

[Series 3: head bone · axial · 0.44mm/px · z∈[-99,-67]mm · 3 of 80 slices shown]
[im 8/80  bone]
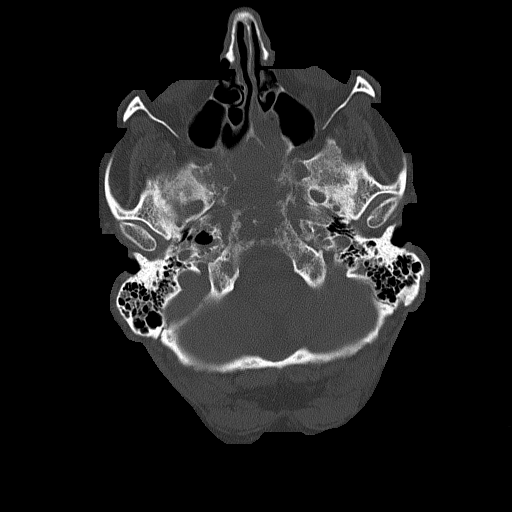
[im 16/80  bone]
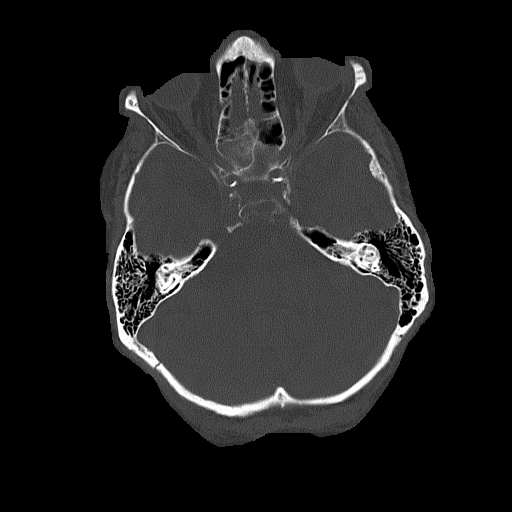
[im 24/80  bone]
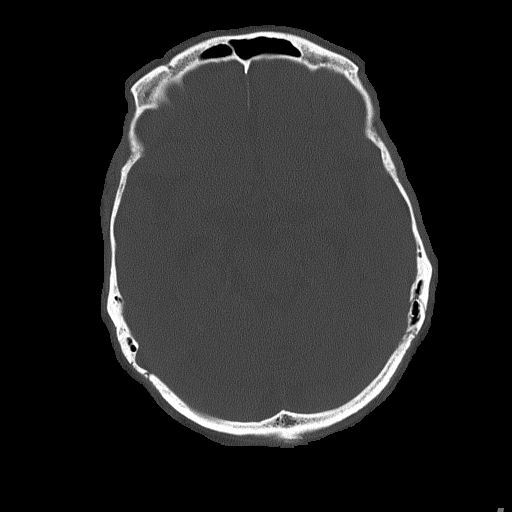

[Series 4: coronal soft tissue · coronal · 0.31mm/px · 3 of 67 slices shown]
[im 23/67  brain]
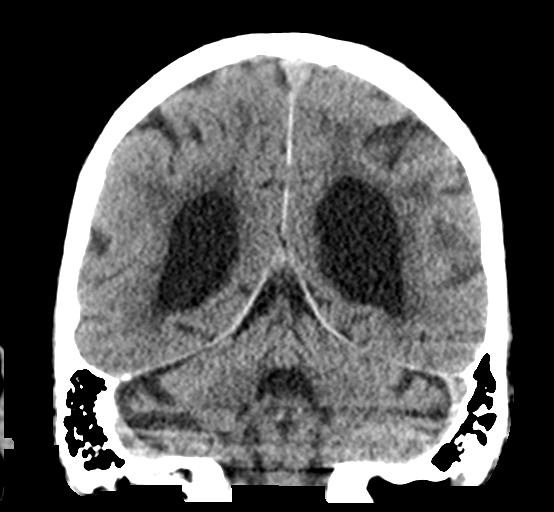
[im 30/67  brain]
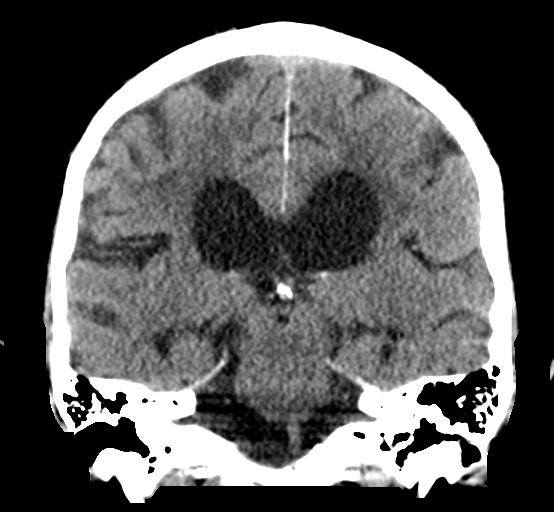
[im 37/67  brain]
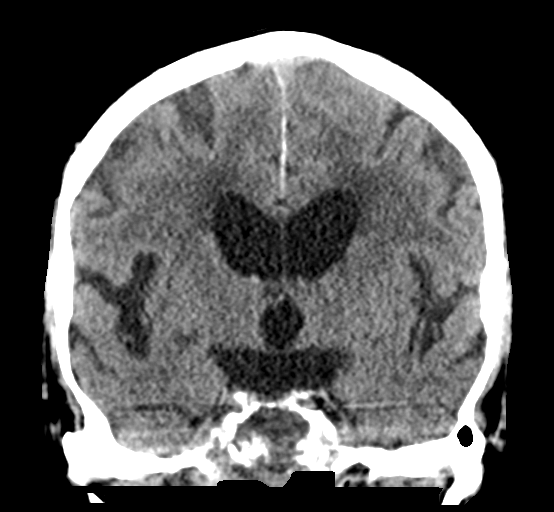

[Series 5: sagittal soft tissue · sagittal · 0.33mm/px · 3 of 53 slices shown]
[im 18/53  brain]
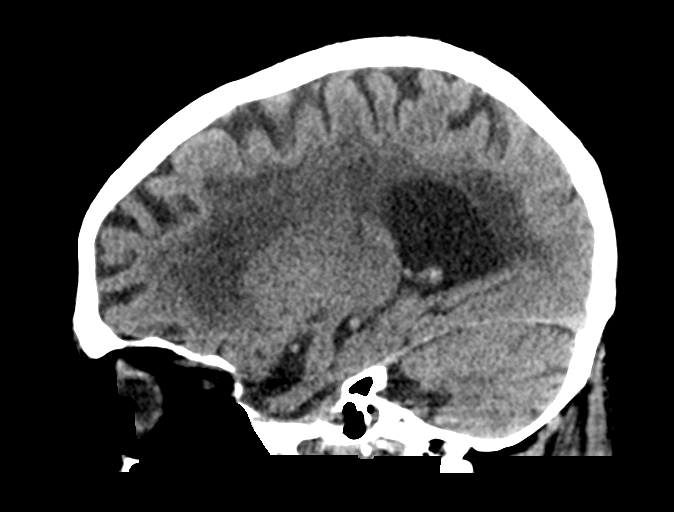
[im 27/53  brain]
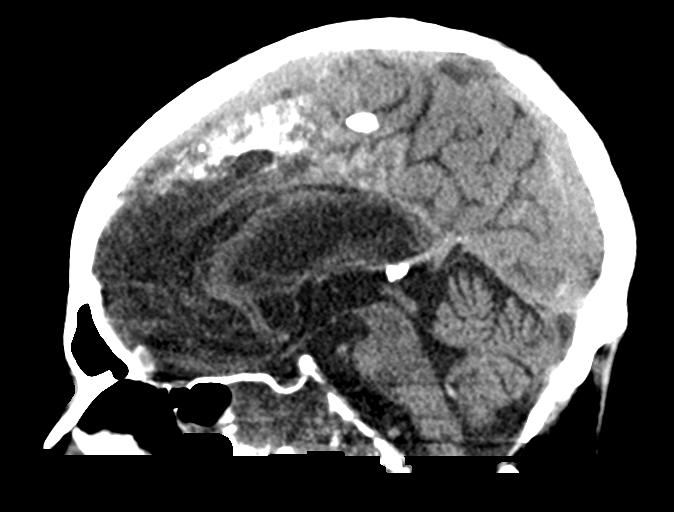
[im 35/53  brain]
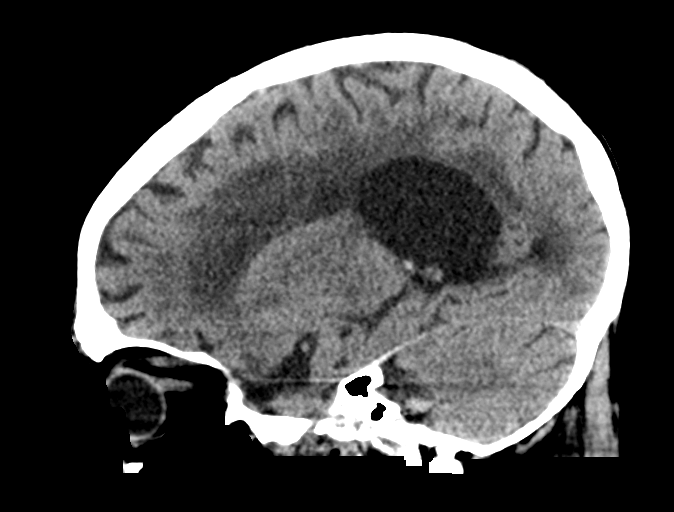

[16 of 47 positions shown; findings below may reference images not displayed]

FINDINGS: Brain: No evidence of acute territorial infarction, hemorrhage,
hydrocephalus,extra-axial collection or mass lesion/mass effect.
There is dilatation the ventricles and sulci consistent with
age-related atrophy. Low-attenuation changes in the deep white
matter consistent with small vessel ischemia.

Vascular: No hyperdense vessel or unexpected calcification.

Skull: A large heterogeneous mass seen within the area of the
sphenoid sinus with surrounding cortical destruction involving the
sphenoid sinuses and extending into the clivus.

Sinuses/Orbits: As described above a heterogeneous mass within the
sphenoid sinuses extending into the nasopharynx.

Other: None
IMPRESSION: Heterogeneous mass within the sphenoid sinus extending into the
nasopharynx causing osseous destruction of the sphenoid sinuses and
clivus, unchanged from prior PET-CT.

Findings consistent with age related atrophy and chronic small
vessel ischemia

## 2022-09-13 ENCOUNTER — Other Ambulatory Visit: Payer: Medicare Other | Admitting: Primary Care

## 2022-09-13 DIAGNOSIS — R634 Abnormal weight loss: Secondary | ICD-10-CM

## 2022-09-13 DIAGNOSIS — Z515 Encounter for palliative care: Secondary | ICD-10-CM

## 2022-09-13 DIAGNOSIS — R4189 Other symptoms and signs involving cognitive functions and awareness: Secondary | ICD-10-CM

## 2022-09-13 DIAGNOSIS — F339 Major depressive disorder, recurrent, unspecified: Secondary | ICD-10-CM

## 2022-09-13 DIAGNOSIS — R5381 Other malaise: Secondary | ICD-10-CM

## 2022-09-13 NOTE — Progress Notes (Signed)
[facility] Consult Note Telephone: [phone]  Fax: [phone]    Date of encounter: 09/13/22 12:45 PM PATIENT NAME: Kenneth Franklin [address] [place] [address]   [phone] (home)  DOB: 03-Dec-1945 MRN: [id number] PRIMARY CARE PROVIDER:    Verita Lamb, NP,  [address] [place] [address] [phone]  REFERRING PROVIDER:   Verita Lamb, NP [address] [place],  [place] [address] [phone]  RESPONSIBLE PARTY:    Contact Information     Name Relation Home Work [place] [facility]) Daughter [phone]  [phone]   Franklin,Kenneth Brother [phone]  [phone]   Kenneth Franklin    [phone]   Kenneth, Franklin   [phone]        I met face to face with patient  in  home. Palliative Care was asked to follow this patient by consultation request of  Kenneth Lamb, NP to address advance care planning and complex medical decision making. This is a follow up visit.                                   ASSESSMENT AND PLAN / RECOMMENDATIONS:   Advance Care Planning/Goals of Care: Goals include to maximize quality of life and symptom management. Patient/health care surrogate gave his/her permission to discuss. Our advance care planning conversation included a discussion about:    The value and importance of advance care planning  Experiences with loved ones who have been seriously ill or have died  Exploration of personal, cultural or spiritual beliefs that might influence medical decisions  Exploration of goals of care in the event of a sudden injury or illness  Identification of a healthcare agent - daughter Discussed care costs and resources has 4 vehicles, may need to sell some. CODE STATUS: FULL CODE  Symptom Management/Plan:  Continues to have sinus drainage, uncomfortable. Discussed allergens, hepa filter and humidity.    Also continues to smoke  Pain: Endorses improved. I started  cymbalta and he appears to have better pain and mood. Continue.  Constipation: endorses ongoing, discussed probiotics,  Recommend miralax daily.  Appetite: continues to have anorexia,  endorses 1.5 meals a day.  Caregiving: endorses too expensive. Discussed consulting atty for elder planning  Sinusitis: Endorses ongoing symptoms, was seen on PET In 6/23. Does not think he was treated. PCP to f/u on Rx.  Follow up Palliative Care Visit: Palliative care will continue to follow for complex medical decision making, advance care planning, and clarification of goals. Return 6 weeks or prn.NP home visiting program ending  I spent 60 minutes providing this consultation. More than 50% of the time in this consultation was spent in counseling and care coordination.   PPS: 40%  HOSPICE ELIGIBILITY/DIAGNOSIS: TBD  Chief Complaint: anorexia, weakness  HISTORY OF PRESENT ILLNESS:  Kenneth Franklin is a 76 y.o. year old male  with debility,weakness  anorexia .   History obtained from review of EMR, discussion with primary team, and interview with family, facility staff/caregiver and/or Kenneth Franklin.  I reviewed available labs, medications, imaging, studies and related documents from the EMR.  Records reviewed and summarized above.   ROS  General: NAD ENMT: denies dysphagia, endorses dry mouth but denies dry eyes Cardiovascular: denies chest pain, denies DOE Pulmonary: denies cough, denies increased SOB Abdomen: endorses poor  appetite, endorses  constipation, endorses continence of bowel GU: denies dysuria, endorses continence of  urine MSK:  denies increased weakness,  no falls reported, uses canes/walker Skin: denies rashes or wounds Neurological: denies pain, denies insomnia Psych: Endorses positive mood Heme/lymph/immuno: denies bruises, abnormal bleeding  Physical Exam: Current and past weights: 131 lbs Constitutional: NAD General: frail appearing, thin EYES: anicteric sclera, lids  intact, no discharge  ENMT: intact hearing, oral mucous membranes moist, dentition missing CV:no LE edema Pulmonary: no increased work of breathing, + cough, room air Abdomen: intake 50%, normo-active BS + 4 quadrants, soft and non tender, no ascites GU: deferred MSK: +sarcopenia, moves all extremities, ambulatory with walker  Skin: warm and dry, no rashes or wounds on visible skin Neuro:  + generalized weakness, + cognitive impairment Psych: anxious affect, A and O x 2 Hem/lymph/immuno: no widespread bruising   Thank you for the opportunity to participate in the care of Kenneth Franklin.  The palliative care team will continue to follow. Please call our office at [phone] if we can be of additional assistance.   Jason Coop, NP   COVID-19 PATIENT SCREENING TOOL Asked and negative response unless otherwise noted:   Have you had symptoms of covid, tested positive or been in contact with someone with symptoms/positive test in the past 5-10 days?

## 2022-09-26 ENCOUNTER — Ambulatory Visit: Payer: Medicare Other | Admitting: Anesthesiology

## 2022-09-26 ENCOUNTER — Encounter
Admission: RE | Disposition: A | Discharge status: Home / Self Care | Payer: Self-pay | Source: Ambulatory Visit | Attending: Gastroenterology

## 2022-09-26 ENCOUNTER — Ambulatory Visit
Admission: RE | Admit: 2022-09-26 | Discharge: 2022-09-26 | Disposition: A | Discharge status: Home / Self Care | Payer: Medicare Other | Source: Ambulatory Visit | Attending: Gastroenterology | Admitting: Gastroenterology

## 2022-09-26 ENCOUNTER — Encounter (INDEPENDENT_AMBULATORY_CARE_PROVIDER_SITE_OTHER): Payer: Self-pay

## 2022-09-26 ENCOUNTER — Encounter: Payer: Self-pay | Admitting: *Deleted

## 2022-09-26 DIAGNOSIS — I10 Essential (primary) hypertension: Secondary | ICD-10-CM | POA: Insufficient documentation

## 2022-09-26 DIAGNOSIS — Z923 Personal history of irradiation: Secondary | ICD-10-CM | POA: Diagnosis not present

## 2022-09-26 DIAGNOSIS — R112 Nausea with vomiting, unspecified: Secondary | ICD-10-CM | POA: Diagnosis not present

## 2022-09-26 DIAGNOSIS — J449 Chronic obstructive pulmonary disease, unspecified: Secondary | ICD-10-CM | POA: Diagnosis not present

## 2022-09-26 DIAGNOSIS — E119 Type 2 diabetes mellitus without complications: Secondary | ICD-10-CM | POA: Insufficient documentation

## 2022-09-26 DIAGNOSIS — F418 Other specified anxiety disorders: Secondary | ICD-10-CM | POA: Insufficient documentation

## 2022-09-26 DIAGNOSIS — Z8673 Personal history of transient ischemic attack (TIA), and cerebral infarction without residual deficits: Secondary | ICD-10-CM | POA: Insufficient documentation

## 2022-09-26 DIAGNOSIS — Z9221 Personal history of antineoplastic chemotherapy: Secondary | ICD-10-CM | POA: Diagnosis not present

## 2022-09-26 DIAGNOSIS — Z85818 Personal history of malignant neoplasm of other sites of lip, oral cavity, and pharynx: Secondary | ICD-10-CM | POA: Diagnosis not present

## 2022-09-26 DIAGNOSIS — Z7902 Long term (current) use of antithrombotics/antiplatelets: Secondary | ICD-10-CM | POA: Insufficient documentation

## 2022-09-26 DIAGNOSIS — F17210 Nicotine dependence, cigarettes, uncomplicated: Secondary | ICD-10-CM | POA: Insufficient documentation

## 2022-09-26 DIAGNOSIS — K2210 Ulcer of esophagus without bleeding: Secondary | ICD-10-CM | POA: Insufficient documentation

## 2022-09-26 HISTORY — PX: ESOPHAGOGASTRODUODENOSCOPY (EGD) WITH PROPOFOL: SHX5813

## 2022-09-26 SURGERY — ESOPHAGOGASTRODUODENOSCOPY (EGD) WITH PROPOFOL
Anesthesia: General

## 2022-09-26 MED ORDER — LIDOCAINE HCL (CARDIAC) PF 100 MG/5ML IV SOSY
PREFILLED_SYRINGE | INTRAVENOUS | Status: DC | PRN
  Administered 2022-09-26: 50 mg via INTRAVENOUS

## 2022-09-26 MED ORDER — PROPOFOL 10 MG/ML IV BOLUS
INTRAVENOUS | Status: DC | PRN
  Administered 2022-09-26: 80 mg via INTRAVENOUS

## 2022-09-26 MED ORDER — PROPOFOL 500 MG/50ML IV EMUL
INTRAVENOUS | Status: DC | PRN
  Administered 2022-09-26: 150 ug/kg/min via INTRAVENOUS

## 2022-09-26 MED ORDER — SODIUM CHLORIDE 0.9 % IV SOLN: INTRAVENOUS | Status: DC

## 2022-09-26 NOTE — Anesthesia Postprocedure Evaluation (Signed)
Anesthesia Post Note  Patient: Kenneth Franklin  Procedure(s) Performed: ESOPHAGOGASTRODUODENOSCOPY (EGD) WITH PROPOFOL  Patient location during evaluation: PACU Anesthesia Type: General Level of consciousness: awake and alert, oriented and patient cooperative Pain management: pain level controlled Vital Signs Assessment: post-procedure vital signs reviewed and stable Respiratory status: spontaneous breathing, nonlabored ventilation and respiratory function stable Cardiovascular status: blood pressure returned to baseline and stable Postop Assessment: adequate PO intake Anesthetic complications: no   No notable events documented.   Last Vitals:  Vitals:   09/26/22 1131 09/26/22 1141  BP: 105/67 137/81  Pulse: 72 67  Resp: 16 19  Temp:    SpO2: 96% 99%    Last Pain:  Vitals:   09/26/22 1141  TempSrc:   PainSc: 0-No pain                 Darrin Nipper

## 2022-09-26 NOTE — Anesthesia Preprocedure Evaluation (Addendum)
Anesthesia Evaluation  Patient identified by MRN, date of birth, ID band Patient awake    Reviewed: Allergy & Precautions, NPO status , Patient's Chart, lab work & pertinent test results  History of Anesthesia Complications Negative for: history of anesthetic complications  Airway Mallampati: IV   Neck ROM: Full    Dental  (+) Poor Dentition   Pulmonary COPD, Current Smoker (1/2 ppd)Patient did not abstain from smoking.,    Pulmonary exam normal breath sounds clear to auscultation       Cardiovascular hypertension, Normal cardiovascular exam Rhythm:Regular Rate:Normal  ECG 08/26/22:  NORMAL SINUS RHYTHM  POSSIBLE ANTEROSEPTAL INFARCT (CITED ON OR BEFORE 27-Jun-2016)   Neuro/Psych PSYCHIATRIC DISORDERS Anxiety Depression Hx alcohol use disorder, none in greater than 5 years  Neuromuscular disease (neuropathy) CVA (2017, no residual symptoms)    GI/Hepatic negative GI ROS,   Endo/Other  diabetes, Type 2  Renal/GU negative Renal ROS     Musculoskeletal Nasopharyngeal SCC   Abdominal   Peds  Hematology negative hematology ROS (+)   Anesthesia Other Findings   Reproductive/Obstetrics                            Anesthesia Physical Anesthesia Plan  ASA: 2  Anesthesia Plan: General   Post-op Pain Management:    Induction: Intravenous  PONV Risk Score and Plan: 1 and Propofol infusion, TIVA and Treatment may vary due to age or medical condition  Airway Management Planned: Natural Airway  Additional Equipment:   Intra-op Plan:   Post-operative Plan:   Informed Consent: I have reviewed the patients History and Physical, chart, labs and discussed the procedure including the risks, benefits and alternatives for the proposed anesthesia with the patient or authorized representative who has indicated his/her understanding and acceptance.       Plan Discussed with: CRNA  Anesthesia Plan  Comments: (LMA/GETA backup discussed.  Patient consented for risks of anesthesia including but not limited to:  - adverse reactions to medications - damage to eyes, teeth, lips or other oral mucosa - nerve damage due to positioning  - sore throat or hoarseness - damage to heart, brain, nerves, lungs, other parts of body or loss of life  Informed patient about role of CRNA in peri- and intra-operative care.  Patient voiced understanding.)        Anesthesia Quick Evaluation

## 2022-09-26 NOTE — Interval H&P Note (Signed)
History and Physical Interval Note:  09/26/2022 11:03 AM  Kenneth Franklin  has presented today for surgery, with the diagnosis of Nausea and vomiting weight loss.  The various methods of treatment have been discussed with the patient and family. After consideration of risks, benefits and other options for treatment, the patient has consented to  Procedure(s): ESOPHAGOGASTRODUODENOSCOPY (EGD) WITH PROPOFOL (N/A) as a surgical intervention.  The patient's history has been reviewed, patient examined, no change in status, stable for surgery.  I have reviewed the patient's chart and labs.  Questions were answered to the patient's satisfaction.     Lesly Rubenstein  Ok to proceed with EGD

## 2022-09-26 NOTE — Anesthesia Procedure Notes (Signed)
Date/Time: 09/26/2022 11:04 AM  Performed by: Johnna Acosta, CRNAPre-anesthesia Checklist: Patient identified, Emergency Drugs available, Suction available, Timeout performed and Patient being monitored Patient Re-evaluated:Patient Re-evaluated prior to induction Oxygen Delivery Method: Nasal cannula Preoxygenation: Pre-oxygenation with 100% oxygen Induction Type: IV induction

## 2022-09-26 NOTE — Op Note (Signed)
[facility] Patient Name: Kenneth Franklin Procedure Date: 09/26/2022 10:52 AM MRN: [id number] Account #: [id number] Date of Birth: Aug 06, 1946 Admit Type: Outpatient Age: 76 Room: [facility] ENDO ROOM 3 Gender: Male Note Status: Finalized Instrument Name: Altamese Cabal Endoscope [id number] Procedure:             Upper GI endoscopy Indications:           Nausea with vomiting Providers:             Andrey Farmer MD, MD Medicines:             Monitored Anesthesia Care Complications:         No immediate complications. Estimated blood loss:                         Minimal. Procedure:             Pre-Anesthesia Assessment:                        - Prior to the procedure, a History and Physical was                         performed, and patient medications and allergies were                         reviewed. The patient is competent. The risks and                         benefits of the procedure and the sedation options and                         risks were discussed with the patient. All questions                         were answered and informed consent was obtained.                         Patient identification and proposed procedure were                         verified by the physician, the nurse, the                         anesthesiologist, the anesthetist and the technician                         in the endoscopy suite. Mental Status Examination:                         alert and oriented. Airway Examination: normal                         oropharyngeal airway and neck mobility. Respiratory                         Examination: clear to auscultation. CV Examination:                         normal. Prophylactic Antibiotics: The patient does not  require prophylactic antibiotics. Prior                         Anticoagulants: The patient has taken Plavix                         (clopidogrel), last dose was 5 days prior to                          procedure. ASA Grade Assessment: III - A patient with                         severe systemic disease. After reviewing the risks and                         benefits, the patient was deemed in satisfactory                         condition to undergo the procedure. The anesthesia                         plan was to use monitored anesthesia care (MAC).                         Immediately prior to administration of medications,                         the patient was re-assessed for adequacy to receive                         sedatives. The heart rate, respiratory rate, oxygen                         saturations, blood pressure, adequacy of pulmonary                         ventilation, and response to care were monitored                         throughout the procedure. The physical status of the                         patient was re-assessed after the procedure.                        After obtaining informed consent, the endoscope was                         passed under direct vision. Throughout the procedure,                         the patient's blood pressure, pulse, and oxygen                         saturations were monitored continuously. The Endoscope                         was introduced through the mouth, and advanced to the  second part of duodenum. The upper GI endoscopy was                         accomplished without difficulty. The patient tolerated                         the procedure well. Findings:      Two superficial esophageal ulcers with no stigmata of recent bleeding       were found. These appeared as "kissing ulcers" so suspect pill       esophagitis. The largest lesion was 1 mm in largest dimension. Biopsies       were taken with a cold forceps for histology. Estimated blood loss was       minimal.      A small amount of food (residue) was found in the gastric body.      The examined duodenum was normal. Impression:            -  Esophageal ulcers with no stigmata of recent                         bleeding. Biopsied.                        - A small amount of food (residue) in the stomach.                        - Normal examined duodenum. Recommendation:        - Discharge patient to home.                        - Resume previous diet.                        - Continue present medications.                        - Await pathology results.                        - Will need to evaluate med list for any possible                         culprits for pill esophagitis. Suspect some component                         of gastroparesis as well so would treat constipation                         aggressively and consider prucalopride.                        - Resume Plavix (clopidogrel) at prior dose today. Procedure Code(s):     --- Professional ---                        [phone], Esophagogastroduodenoscopy, flexible,                         transoral; with biopsy, single or multiple Diagnosis Code(s):     --- Professional ---  K22.10, Ulcer of esophagus without bleeding                        R11.2, Nausea with vomiting, unspecified CPT copyright 2022 American Medical Association. All rights reserved. The codes documented in this report are preliminary and upon coder review may  be revised to meet current compliance requirements. Andrey Farmer MD, MD 09/26/2022 11:26:23 AM Number of Addenda: 0 Note Initiated On: 09/26/2022 10:52 AM Estimated Blood Loss:  Estimated blood loss was minimal.      [facility]

## 2022-09-26 NOTE — Transfer of Care (Signed)
Immediate Anesthesia Transfer of Care Note  Patient: Starlyn Skeans  Procedure(s) Performed: ESOPHAGOGASTRODUODENOSCOPY (EGD) WITH PROPOFOL  Patient Location: PACU  Anesthesia Type:General  Level of Consciousness: sedated  Airway & Oxygen Therapy: Patient Spontanous Breathing  Post-op Assessment: Report given to RN and Post -op Vital signs reviewed and stable  Post vital signs: Reviewed and stable  Last Vitals:  Vitals Value Taken Time  BP 88/62 09/26/22 1121  Temp    Pulse 76 09/26/22 1121  Resp    SpO2 94 % 09/26/22 1121    Last Pain:  Vitals:   09/26/22 1121  TempSrc:   PainSc: Asleep         Complications: No notable events documented.

## 2022-09-26 NOTE — H&P (Signed)
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 Last took plavix 5 days ago. Very dry mouth from radiation. No family history of GI malignancies.    Current Facility-Administered Medications:    0.9 %  sodium chloride infusion, , Intravenous, Continuous, Ledell Codrington, Hilton Cork, MD, Last Rate: 20 mL/hr at 09/26/22 1056, New Bag at 09/26/22 1056  Facility-Administered Medications Ordered in Other Encounters:    sodium chloride flush (NS) 0.9 % injection 10 mL, 10 mL, Intravenous, PRN, Mike Gip, Melissa C, MD, 10 mL at 12/03/20 1138  Medications Prior to Admission  Medication Sig Dispense Refill Last Dose   albuterol (PROVENTIL) (2.5 MG/3ML) 0.083% nebulizer solution    09/25/2022   aspirin EC 81 MG tablet Take 81 mg by mouth daily. Swallow whole.   09/25/2022   atorvastatin (LIPITOR) 80 MG tablet TAKE 1 TABLET (80 MG TOTAL) BY MOUTH NIGHTLY. 90 tablet 0 09/25/2022   Cholecalciferol (QC VITAMIN D3) 50 MCG (2000 UT) CAPS Take by mouth.   09/25/2022   DULoxetine (CYMBALTA) 30 MG capsule Take 30 mg by mouth daily.   09/25/2022   famotidine (PEPCID) 40 MG tablet Take 40 mg by mouth daily.   09/25/2022   fluticasone (FLONASE) 50 MCG/ACT nasal spray Place 2 sprays into the nose daily.   25/85/2778   folic acid (FOLVITE) 1 MG tablet Take 1 mg by mouth daily.   09/25/2022   glipiZIDE (GLUCOTROL) 5 MG tablet Take 1 tablet by mouth daily.   09/25/2022   losartan (COZAAR) 100 MG tablet Take 100 mg by mouth daily.   09/25/2022   ondansetron (ZOFRAN-ODT) 8 MG disintegrating tablet Take 8 mg by mouth every 8 (eight) hours as needed.   09/26/2022   oxycodone (OXY-IR) 5 MG capsule Take 5 mg by mouth 2 (two) times daily as needed.   09/25/2022    pantoprazole (PROTONIX) 40 MG tablet Take 40 mg by mouth daily.   09/25/2022   acetaminophen (TYLENOL) 325 MG tablet Take 975 mg by mouth every 6 (six) hours as needed.      clopidogrel (PLAVIX) 75 MG tablet Take 75 mg by mouth every morning.   09/21/2022   escitalopram (LEXAPRO) 20 MG tablet Take 20 mg by mouth every morning.       metoCLOPramide (REGLAN) 5 MG tablet Take 5 mg by mouth 3 (three) times daily. (Patient not taking: Reported on 08/18/2022)      polyethylene glycol powder (GLYCOLAX/MIRALAX) 17 GM/SCOOP powder Take by mouth.      polyvinyl alcohol (LIQUIFILM TEARS) 1.4 % ophthalmic solution Apply to eye.      sodium chloride (OCEAN) 0.65 % nasal spray Place into the nose.        Allergies  Allergen Reactions   Propoxyphene     Unknown reaction     Past Medical History:  Diagnosis Date   Acute ischemic stroke ([place]) 2017   Alcohol abuse    Anxiety    Cancer ([place])    Depression    Diabetes mellitus without complication ([place])    Dyspnea    pcp knows and ordered rescue inhaler   Hyperlipidemia    Hypertension    Skin cancer    Squamous Cell Carcinoma In Situ    Review of systems:  Otherwise negative.    Physical Exam  Gen: Alert, oriented. Appears stated age.  HEENT: PERRLA. Lungs: No respiratory distress CV: RRR Abd: soft, benign, no masses Ext: No edema    Planned procedures: Proceed with EGD. The patient understands the nature of the planned procedure, indications, risks, alternatives and potential complications including but not limited to bleeding, infection, perforation, damage to internal organs and possible oversedation/side effects from anesthesia. The patient agrees and gives consent to proceed.  Please refer to procedure notes for findings, recommendations and patient disposition/instructions.     Lesly Rubenstein, MD [facility]

## 2022-09-27 ENCOUNTER — Encounter: Payer: Self-pay | Admitting: Gastroenterology

## 2022-09-28 ENCOUNTER — Other Ambulatory Visit: Payer: Self-pay | Admitting: Gastroenterology

## 2022-09-28 DIAGNOSIS — R110 Nausea: Secondary | ICD-10-CM

## 2022-09-28 LAB — SURGICAL PATHOLOGY

## 2022-10-04 ENCOUNTER — Telehealth: Payer: Self-pay | Admitting: Primary Care

## 2022-10-04 NOTE — Telephone Encounter (Signed)
T/c to advise of d/c of home NP pal care program. Pt is doing well on SNRI, I had d/c lexapro and buspar. Requested POA to f/u with PCP for ongoing prescribing. Will have pal care RN f/u prn.

## 2022-10-14 ENCOUNTER — Ambulatory Visit
Admission: RE | Admit: 2022-10-14 | Discharge: 2022-10-14 | Disposition: A | Discharge status: Home / Self Care | Payer: Medicare Other | Source: Ambulatory Visit | Attending: Gastroenterology | Admitting: Gastroenterology

## 2022-10-14 DIAGNOSIS — R110 Nausea: Secondary | ICD-10-CM | POA: Insufficient documentation

## 2022-10-14 MED ORDER — TECHNETIUM TC 99M SULFUR COLLOID
2.0000 | Freq: Once | INTRAVENOUS | Status: AC | PRN
  Administered 2022-10-14: 2.15 via ORAL

## 2022-10-19 ENCOUNTER — Other Ambulatory Visit: Payer: Self-pay | Admitting: Gastroenterology

## 2022-10-19 ENCOUNTER — Encounter: Payer: Self-pay | Admitting: Gastroenterology

## 2022-10-19 DIAGNOSIS — R634 Abnormal weight loss: Secondary | ICD-10-CM

## 2022-10-19 DIAGNOSIS — R110 Nausea: Secondary | ICD-10-CM

## 2022-10-24 ENCOUNTER — Telehealth: Payer: Self-pay

## 2022-10-24 NOTE — Telephone Encounter (Signed)
1205 pm.  Messages received from patient regarding neuropathy.  Return call made to daughter to advise medication request would need to be directed to PCP.  Update provided to daughter on changes within the [facility] program.

## 2022-11-01 ENCOUNTER — Other Ambulatory Visit: Payer: Medicare Other | Admitting: Primary Care

## 2022-11-02 ENCOUNTER — Ambulatory Visit
Admission: RE | Admit: 2022-11-02 | Discharge: 2022-11-02 | Disposition: A | Discharge status: Home / Self Care | Payer: Medicare Other | Source: Ambulatory Visit | Attending: Gastroenterology | Admitting: Gastroenterology

## 2022-11-02 DIAGNOSIS — R634 Abnormal weight loss: Secondary | ICD-10-CM | POA: Diagnosis present

## 2022-11-02 DIAGNOSIS — I6782 Cerebral ischemia: Secondary | ICD-10-CM | POA: Diagnosis not present

## 2022-11-02 DIAGNOSIS — R110 Nausea: Secondary | ICD-10-CM | POA: Diagnosis present

## 2022-11-02 LAB — POCT I-STAT CREATININE: Creatinine, Ser: 0.7 mg/dL (ref 0.61–1.24)

## 2022-11-02 MED ORDER — IOHEXOL 300 MG/ML  SOLN
75.0000 mL | Freq: Once | INTRAMUSCULAR | Status: AC | PRN
  Administered 2022-11-02: 75 mL via INTRAVENOUS

## 2022-11-04 ENCOUNTER — Emergency Department: Payer: Medicare Other

## 2022-11-04 ENCOUNTER — Other Ambulatory Visit: Payer: Self-pay

## 2022-11-04 ENCOUNTER — Observation Stay
Admission: EM | Admit: 2022-11-04 | Discharge: 2022-11-07 | Disposition: A | Discharge status: Home Health | Payer: Medicare Other | Attending: Internal Medicine | Admitting: Internal Medicine

## 2022-11-04 DIAGNOSIS — J189 Pneumonia, unspecified organism: Secondary | ICD-10-CM | POA: Insufficient documentation

## 2022-11-04 DIAGNOSIS — E43 Unspecified severe protein-calorie malnutrition: Secondary | ICD-10-CM | POA: Diagnosis not present

## 2022-11-04 DIAGNOSIS — H5702 Anisocoria: Secondary | ICD-10-CM | POA: Diagnosis present

## 2022-11-04 DIAGNOSIS — R109 Unspecified abdominal pain: Secondary | ICD-10-CM | POA: Diagnosis not present

## 2022-11-04 DIAGNOSIS — Z7902 Long term (current) use of antithrombotics/antiplatelets: Secondary | ICD-10-CM | POA: Insufficient documentation

## 2022-11-04 DIAGNOSIS — Z1152 Encounter for screening for COVID-19: Secondary | ICD-10-CM | POA: Diagnosis not present

## 2022-11-04 DIAGNOSIS — F419 Anxiety disorder, unspecified: Secondary | ICD-10-CM | POA: Diagnosis present

## 2022-11-04 DIAGNOSIS — F17200 Nicotine dependence, unspecified, uncomplicated: Secondary | ICD-10-CM | POA: Diagnosis present

## 2022-11-04 DIAGNOSIS — R531 Weakness: Principal | ICD-10-CM

## 2022-11-04 DIAGNOSIS — Z8673 Personal history of transient ischemic attack (TIA), and cerebral infarction without residual deficits: Secondary | ICD-10-CM | POA: Insufficient documentation

## 2022-11-04 DIAGNOSIS — Z85818 Personal history of malignant neoplasm of other sites of lip, oral cavity, and pharynx: Secondary | ICD-10-CM | POA: Diagnosis not present

## 2022-11-04 DIAGNOSIS — R296 Repeated falls: Secondary | ICD-10-CM

## 2022-11-04 DIAGNOSIS — K219 Gastro-esophageal reflux disease without esophagitis: Secondary | ICD-10-CM | POA: Diagnosis present

## 2022-11-04 DIAGNOSIS — I10 Essential (primary) hypertension: Secondary | ICD-10-CM | POA: Insufficient documentation

## 2022-11-04 DIAGNOSIS — R0989 Other specified symptoms and signs involving the circulatory and respiratory systems: Secondary | ICD-10-CM | POA: Diagnosis present

## 2022-11-04 DIAGNOSIS — E119 Type 2 diabetes mellitus without complications: Secondary | ICD-10-CM | POA: Insufficient documentation

## 2022-11-04 DIAGNOSIS — J449 Chronic obstructive pulmonary disease, unspecified: Secondary | ICD-10-CM | POA: Insufficient documentation

## 2022-11-04 DIAGNOSIS — I739 Peripheral vascular disease, unspecified: Secondary | ICD-10-CM

## 2022-11-04 DIAGNOSIS — C119 Malignant neoplasm of nasopharynx, unspecified: Secondary | ICD-10-CM | POA: Diagnosis present

## 2022-11-04 DIAGNOSIS — F1010 Alcohol abuse, uncomplicated: Secondary | ICD-10-CM | POA: Diagnosis present

## 2022-11-04 DIAGNOSIS — F32A Depression, unspecified: Secondary | ICD-10-CM | POA: Diagnosis present

## 2022-11-04 DIAGNOSIS — Z79899 Other long term (current) drug therapy: Secondary | ICD-10-CM | POA: Insufficient documentation

## 2022-11-04 DIAGNOSIS — Z85828 Personal history of other malignant neoplasm of skin: Secondary | ICD-10-CM | POA: Insufficient documentation

## 2022-11-04 DIAGNOSIS — R5381 Other malaise: Secondary | ICD-10-CM | POA: Insufficient documentation

## 2022-11-04 DIAGNOSIS — Z7982 Long term (current) use of aspirin: Secondary | ICD-10-CM | POA: Insufficient documentation

## 2022-11-04 DIAGNOSIS — R0603 Acute respiratory distress: Secondary | ICD-10-CM | POA: Diagnosis present

## 2022-11-04 DIAGNOSIS — Z7984 Long term (current) use of oral hypoglycemic drugs: Secondary | ICD-10-CM | POA: Diagnosis not present

## 2022-11-04 DIAGNOSIS — E785 Hyperlipidemia, unspecified: Secondary | ICD-10-CM | POA: Diagnosis present

## 2022-11-04 LAB — URINALYSIS, ROUTINE W REFLEX MICROSCOPIC
Bacteria, UA: NONE SEEN
Bilirubin Urine: NEGATIVE
Glucose, UA: NEGATIVE mg/dL
Hgb urine dipstick: NEGATIVE
Ketones, ur: 5 mg/dL — AB
Leukocytes,Ua: NEGATIVE
Nitrite: NEGATIVE
Protein, ur: 30 mg/dL — AB
Specific Gravity, Urine: 1.019 (ref 1.005–1.030)
pH: 6 (ref 5.0–8.0)

## 2022-11-04 LAB — CBC
HCT: 43.2 % (ref 39.0–52.0)
Hemoglobin: 14 g/dL (ref 13.0–17.0)
MCH: 28.8 pg (ref 26.0–34.0)
MCHC: 32.4 g/dL (ref 30.0–36.0)
MCV: 88.9 fL (ref 80.0–100.0)
Platelets: 387 10*3/uL (ref 150–400)
RBC: 4.86 MIL/uL (ref 4.22–5.81)
RDW: 12.8 % (ref 11.5–15.5)
WBC: 9.1 10*3/uL (ref 4.0–10.5)
nRBC: 0 % (ref 0.0–0.2)

## 2022-11-04 LAB — COMPREHENSIVE METABOLIC PANEL
ALT: 15 U/L (ref 0–44)
AST: 23 U/L (ref 15–41)
Albumin: 3.1 g/dL — ABNORMAL LOW (ref 3.5–5.0)
Alkaline Phosphatase: 93 U/L (ref 38–126)
Anion gap: 10 (ref 5–15)
BUN: 9 mg/dL (ref 8–23)
CO2: 27 mmol/L (ref 22–32)
Calcium: 9.1 mg/dL (ref 8.9–10.3)
Chloride: 97 mmol/L — ABNORMAL LOW (ref 98–111)
Creatinine, Ser: 0.75 mg/dL (ref 0.61–1.24)
GFR, Estimated: >60 mL/min (ref >60)
Glucose, Bld: 142 mg/dL — ABNORMAL HIGH (ref 70–99)
Potassium: 3.6 mmol/L (ref 3.5–5.1)
Sodium: 134 mmol/L — ABNORMAL LOW (ref 135–145)
Total Bilirubin: 0.8 mg/dL (ref 0.3–1.2)
Total Protein: 7.1 g/dL (ref 6.5–8.1)

## 2022-11-04 LAB — TROPONIN I (HIGH SENSITIVITY): Troponin I (High Sensitivity): 4 ng/L (ref <18)

## 2022-11-04 LAB — RESP PANEL BY RT-PCR (FLU A&B, COVID) ARPGX2
Influenza A by PCR: NEGATIVE
Influenza B by PCR: NEGATIVE
SARS Coronavirus 2 by RT PCR: NEGATIVE

## 2022-11-04 LAB — LIPASE, BLOOD: Lipase: 32 U/L (ref 11–51)

## 2022-11-04 LAB — ETHANOL: Alcohol, Ethyl (B): <10 mg/dL (ref <10)

## 2022-11-04 MED ORDER — ESCITALOPRAM OXALATE 10 MG PO TABS
20.0000 mg | ORAL_TABLET | ORAL | Status: DC
  Administered 2022-11-05 – 2022-11-07 (×3): 20 mg via ORAL
  Filled 2022-11-04 (×3): qty 2

## 2022-11-04 MED ORDER — ASPIRIN 81 MG PO TBEC
81.0000 mg | DELAYED_RELEASE_TABLET | Freq: Every day | ORAL | Status: DC
  Administered 2022-11-04 – 2022-11-07 (×4): 81 mg via ORAL
  Filled 2022-11-04 (×4): qty 1

## 2022-11-04 MED ORDER — SODIUM CHLORIDE 0.9 % IV SOLN: 1.0000 g | INTRAVENOUS | Status: DC

## 2022-11-04 MED ORDER — DULOXETINE HCL 30 MG PO CPEP
30.0000 mg | ORAL_CAPSULE | Freq: Every day | ORAL | Status: DC
  Administered 2022-11-04 – 2022-11-07 (×4): 30 mg via ORAL
  Filled 2022-11-04 (×4): qty 1

## 2022-11-04 MED ORDER — HEPARIN SODIUM (PORCINE) 5000 UNIT/ML IJ SOLN
5000.0000 [IU] | Freq: Two times a day (BID) | INTRAMUSCULAR | Status: DC
  Administered 2022-11-04 – 2022-11-07 (×6): 5000 [IU] via SUBCUTANEOUS
  Filled 2022-11-04 (×6): qty 1

## 2022-11-04 MED ORDER — ACETAMINOPHEN 325 MG PO TABS
650.0000 mg | ORAL_TABLET | Freq: Four times a day (QID) | ORAL | Status: DC | PRN
  Administered 2022-11-05: 650 mg via ORAL
  Filled 2022-11-04: qty 2

## 2022-11-04 MED ORDER — SODIUM CHLORIDE 0.9 % IV BOLUS
1000.0000 mL | Freq: Once | INTRAVENOUS | Status: AC
  Administered 2022-11-04: 1000 mL via INTRAVENOUS

## 2022-11-04 MED ORDER — SODIUM CHLORIDE 0.9 % IV SOLN
500.0000 mg | Freq: Once | INTRAVENOUS | Status: AC
  Administered 2022-11-04: 500 mg via INTRAVENOUS
  Filled 2022-11-04: qty 5

## 2022-11-04 MED ORDER — ACETAMINOPHEN 650 MG RE SUPP: 650.0000 mg | Freq: Four times a day (QID) | RECTAL | Status: DC | PRN

## 2022-11-04 MED ORDER — IOHEXOL 350 MG/ML SOLN
75.0000 mL | Freq: Once | INTRAVENOUS | Status: AC | PRN
  Administered 2022-11-04: 75 mL via INTRAVENOUS

## 2022-11-04 MED ORDER — ALBUTEROL SULFATE (2.5 MG/3ML) 0.083% IN NEBU: 2.5000 mg | INHALATION_SOLUTION | Freq: Four times a day (QID) | RESPIRATORY_TRACT | Status: DC | PRN

## 2022-11-04 MED ORDER — CLOPIDOGREL BISULFATE 75 MG PO TABS
75.0000 mg | ORAL_TABLET | Freq: Every morning | ORAL | Status: DC
  Administered 2022-11-05 – 2022-11-07 (×3): 75 mg via ORAL
  Filled 2022-11-04 (×3): qty 1

## 2022-11-04 MED ORDER — LACTATED RINGERS IV SOLN: INTRAVENOUS | Status: AC

## 2022-11-04 MED ORDER — SODIUM CHLORIDE 0.9 % IV SOLN: 500.0000 mg | INTRAVENOUS | Status: DC

## 2022-11-04 MED ORDER — LOSARTAN POTASSIUM 50 MG PO TABS
100.0000 mg | ORAL_TABLET | Freq: Every day | ORAL | Status: DC
  Administered 2022-11-04 – 2022-11-07 (×4): 100 mg via ORAL
  Filled 2022-11-04 (×4): qty 2

## 2022-11-04 MED ORDER — MELATONIN 5 MG PO TABS
5.0000 mg | ORAL_TABLET | Freq: Every day | ORAL | Status: DC
  Administered 2022-11-04 – 2022-11-06 (×3): 5 mg via ORAL
  Filled 2022-11-04 (×3): qty 1

## 2022-11-04 MED ORDER — SODIUM CHLORIDE 0.9% FLUSH
3.0000 mL | Freq: Two times a day (BID) | INTRAVENOUS | Status: DC
  Administered 2022-11-04 – 2022-11-07 (×5): 3 mL via INTRAVENOUS

## 2022-11-04 MED ORDER — FLUTICASONE PROPIONATE 50 MCG/ACT NA SUSP
2.0000 | Freq: Every day | NASAL | Status: DC
  Administered 2022-11-06 – 2022-11-07 (×2): 2 via NASAL
  Filled 2022-11-04: qty 16

## 2022-11-04 MED ORDER — SODIUM CHLORIDE 0.9 % IV SOLN
1.0000 g | Freq: Once | INTRAVENOUS | Status: AC
  Administered 2022-11-04: 1 g via INTRAVENOUS
  Filled 2022-11-04: qty 10

## 2022-11-04 MED ORDER — PANTOPRAZOLE SODIUM 40 MG PO TBEC
40.0000 mg | DELAYED_RELEASE_TABLET | Freq: Every day | ORAL | Status: DC
  Administered 2022-11-04 – 2022-11-07 (×4): 40 mg via ORAL
  Filled 2022-11-04 (×4): qty 1

## 2022-11-04 NOTE — ED Triage Notes (Addendum)
Pt comes with c/o increased weakness, decreased intake. Pt was dx with cancer and has been going down every since. Pt denies any recent chemo or radiation.  Pt states pain all down left side.   Pt states sob. Pt has labored breathing noted and appears out of it.

## 2022-11-04 NOTE — Assessment & Plan Note (Signed)
IV PPI therapy.  

## 2022-11-04 NOTE — Assessment & Plan Note (Signed)
Will try some low-dose steroids and scheduled plus as needed nebulizers.  Although the reports of some initial mild hypoxia, this quickly resolved.  No evidence of acute infection given normal procalcitonin.  Breathing slowly improving.

## 2022-11-04 NOTE — Assessment & Plan Note (Addendum)
Pt states he has left sided abd pain that is chronic. 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  Review shows patient's had a CT angio of the abdomen and aorta runoff in 2017 showing: No focal stenosis. Suspect he may need nonurgent angio eval of mesenteric circulation to evaluate for abdominal angina.

## 2022-11-04 NOTE — Assessment & Plan Note (Signed)
Will get a stat ethanol level. Thiamine 1 mg daily. CIWA protocol.

## 2022-11-04 NOTE — ED Provider Triage Note (Signed)
Emergency Medicine Provider Triage Evaluation Note  Kenneth Franklin , a 76 y.o. male  was evaluated in triage.  Pt complains of weakness and decreased appetite progressive for the past several days. No fever, nausea, vomiting, or diarrhea.  Physical Exam  There were no vitals taken for this visit. Gen:   Awake, no distress   Resp:  Normal effort  MSK:   Moves extremities without difficulty  Other:    Medical Decision Making  Medically screening exam initiated at 3:25 PM.  Appropriate orders placed.  NICKALUS THORNSBERRY was informed that the remainder of the evaluation will be completed by another provider, this initial triage assessment does not replace that evaluation, and the importance of remaining in the ED until their evaluation is complete.    Victorino Dike, FNP 11/04/22 1528

## 2022-11-04 NOTE — Assessment & Plan Note (Signed)
Patient has decreased pedal pulses in both legs. Pulses present are 1+ both feet.

## 2022-11-04 NOTE — Assessment & Plan Note (Signed)
Vitals:   11/04/22 1525 11/04/22 1927 11/04/22 2000 11/04/22 2030  BP: 114/82 (!) 149/78 135/80 (!) 143/75  Losartan and benztropine

## 2022-11-04 NOTE — Assessment & Plan Note (Signed)
Nicotine patch.  Avoid excessive o2.

## 2022-11-04 NOTE — Assessment & Plan Note (Signed)
Attributed to deconditioning and electrolyte issues. Suspect underlying vitamin D deficiency possibly hypogonadism and circulation issues.

## 2022-11-04 NOTE — Assessment & Plan Note (Signed)
Continue with atorvastatin 80 mg.

## 2022-11-04 NOTE — ED Notes (Addendum)
Pt placed on 2L [place] and O2 increased to 87%. O2 increased to 3L at this time O2 88%.

## 2022-11-04 NOTE — Assessment & Plan Note (Signed)
Will get A1c. Not sure if pt has DM or PCM.  We will hold glipizide. Start Glycemic protocol.

## 2022-11-04 NOTE — ED Provider Notes (Signed)
[name] Provider Note    Event Date/Time   First MD Initiated Contact with Patient 11/04/22 1535     (approximate)   History   Weakness   HPI  Kenneth Franklin is a 76 y.o. male who presents to the emergency department today because of concerns for weakness.  The patient states that he has issues with declining strength for some time although it is worsened recently.  This has been accompanied by appetite and nausea.  Patient denies any associated abdominal pain with nausea.  Today the patient felt so weak that he was having a hard time getting out of his bed.      Physical Exam   Triage Vital Signs: ED Triage Vitals  Enc Vitals Group     BP 11/04/22 1525 114/82     Pulse Rate 11/04/22 1525 (!) 110     Resp 11/04/22 1525 17     Temp 11/04/22 1525 98.3 F (36.8 C)     Temp Source 11/04/22 1525 Axillary     SpO2 11/04/22 1525 (!) 82 %     Weight --      Height --      Head Circumference --      Peak Flow --      Pain Score 11/04/22 1524 6     Pain Loc --      Pain Edu? --      Excl. in [place]? --     Most recent vital signs: Vitals:   11/04/22 1525  BP: 114/82  Pulse: (!) 110  Resp: 17  Temp: 98.3 F (36.8 C)  SpO2: (!) 82%    General: Awake, alert, oriented. CV:  Good peripheral perfusion. Tachycardia. Resp:  Normal effort. Lungs clear. Abd:  No distention. Non tender.    ED Results / Procedures / Treatments   Labs (all labs ordered are listed, but only abnormal results are displayed) Labs Reviewed  URINALYSIS, ROUTINE W REFLEX MICROSCOPIC - Abnormal; Notable for the following components:      Result Value   Color, Urine YELLOW (*)    APPearance HAZY (*)    Ketones, ur 5 (*)    Protein, ur 30 (*)    All other components within normal limits  COMPREHENSIVE METABOLIC PANEL - Abnormal; Notable for the following components:   Sodium 134 (*)    Chloride 97 (*)    Glucose, Bld 142 (*)    Albumin 3.1 (*)    All other  components within normal limits  RESP PANEL BY RT-PCR (FLU A&B, COVID) ARPGX2  CULTURE, BLOOD (ROUTINE X 2)  CULTURE, BLOOD (ROUTINE X 2)  CBC  LIPASE, BLOOD  CBG MONITORING, ED  TROPONIN I (HIGH SENSITIVITY)     EKG  I, [name], attending physician, personally viewed and interpreted this EKG  EKG Time: 1531 Rate: 98 Rhythm: normal sinus rhythm Axis: normal Intervals: qtc 428 QRS: narrow, q waves v1, v2 ST changes: no st elevation Impression: abnormal ekg   RADIOLOGY I independently interpreted and visualized the CXR. My interpretation: No pneumonia Radiology interpretation:  IMPRESSION:  No acute cardiopulmonary process.    I independently interpreted and visualized the CT angio PE. My interpretation: No large PE Radiology interpretation:  IMPRESSION:  1. No filling defect is identified in the pulmonary arterial tree to  suggest pulmonary embolus.  2. Mild peripheral airspace opacity with associated marginal  reticulation in both lower lobes, query aspiration pneumonitis or  mild bilateral pneumonia.  3. Borderline prominent 1.0 cm right hilar lymph node, nonspecific.  4. Old granulomatous disease.  5. Asymmetric right renal atrophy.  6. Substantial atheromatous plaque at the origins of the celiac  trunk and SMA, patency of the structures is not established.  7. Aortic atherosclerosis.  8. Emphysema.      PROCEDURES:  Critical Care performed: No  Procedures   MEDICATIONS ORDERED IN ED: Medications - No data to display   IMPRESSION / MDM / [place] / ED COURSE  I reviewed the triage vital signs and the nursing notes.                              Differential diagnosis includes, but is not limited to, pneumonia, UTI, ACS, PE.  Patient's presentation is most consistent with acute presentation with potential threat to life or bodily function.  The patient is on the cardiac monitor to evaluate for evidence of arrhythmia and/or  significant heart rate changes.  Patient presented to the urgency department today because of increasing weakness. initial vital signs were concerning for hypoxia and tachycardia.  Chest x-ray did not show any concerning findings.  Blood work without significant anemia or electrolyte normality.  Given history of cancer hypoxia did obtain a CT angio to evaluate for possible PE.  This did not show pulmonary embolism was concerning for possible pneumonia.  I do think this could explain the increasing weakness as well as oxygen.  Will start patient on IV antibiotics. Discussed with Dr. Posey Pronto with the hospitalist service who will plan on admission.     FINAL CLINICAL IMPRESSION(S) / ED DIAGNOSES   Final diagnoses:  Weakness  Pneumonia due to infectious organism, unspecified laterality, unspecified part of lung       Note:  This document was prepared using Dragon voice recognition software and may include unintentional dictation errors.    [name], MD 11/04/22 1950

## 2022-11-04 NOTE — ED Notes (Signed)
Pt taken straight to ED 9 at this time.

## 2022-11-04 NOTE — H&P (Addendum)
History and Physical    Chief Complaint: Generalized weakness.    HISTORY OF PRESENT ILLNESS: Kenneth Franklin is an 76 y.o. male  generalized weakness and " pain all left side ". Pt h/o cancer , and has not had any chemo or radiation.  Pt initially had reported left sided pain and he was referring to left sided abd pain.  That he has had chronically and is resolved. Today he states he was using his walker and  His legs gave out. He has progressively been getting weaker and has had falls.   Pt has  Past Medical History:  Diagnosis Date   Acute ischemic stroke ([place]) 2017   Alcohol abuse    Anxiety    Cancer ([place])    Depression    Diabetes mellitus without complication ([place])    Dyspnea    pcp knows and ordered rescue inhaler   Hyperlipidemia    Hypertension    Skin cancer    Squamous Cell Carcinoma In Situ    Review of Systems  Eyes:  Negative for visual disturbance.       Anisocoria  Gastrointestinal:  Positive for abdominal pain.  Neurological:  Positive for weakness. Negative for dizziness, light-headedness, numbness and headaches.   Allergies  Allergen Reactions   Propoxyphene     Unknown reaction   Past Surgical History:  Procedure Laterality Date   CATARACT EXTRACTION Bilateral    COLONOSCOPY     ESOPHAGOGASTRODUODENOSCOPY (EGD) WITH PROPOFOL N/A 09/26/2022   Procedure: ESOPHAGOGASTRODUODENOSCOPY (EGD) WITH PROPOFOL;  Surgeon: Lesly Rubenstein, MD;  Location: [facility];  Service: Endoscopy;  Laterality: N/A;   NASOPHARYNGOSCOPY N/A 08/12/2020   Procedure: ENDOSCOPIC NASOPHARYNGOSCOPY WITH BIOPSY;  Surgeon: Margaretha Sheffield, MD;  Location: [facility];  Service: ENT;  Laterality: N/A;   PORTA CATH INSERTION N/A 08/24/2020   Procedure: PORTA CATH INSERTION;  Surgeon: Algernon Huxley, MD;  Location: [place] [facility];  Service: Cardiovascular;  Laterality: N/A;   PORTA CATH REMOVAL N/A 07/22/2021   Procedure: PORTA CATH REMOVAL;  Surgeon: Algernon Huxley, MD;   Location: [place] [facility];  Service: Cardiovascular;  Laterality: N/A;       MEDICATIONS: Current Outpatient Medications  Medication Instructions   acetaminophen (TYLENOL) 975 mg, Oral, Every 6 hours PRN   albuterol (PROVENTIL) (2.5 MG/3ML) 0.083% nebulizer solution    Albuterol Sulfate, sensor, 108 (90 Base) MCG/ACT AEPB 1-2 puffs, Inhalation, Every 6 hours PRN   aspirin EC 81 mg, Oral, Daily, Swallow whole.   atorvastatin (LIPITOR) 80 MG tablet TAKE 1 TABLET (80 MG TOTAL) BY MOUTH NIGHTLY.   clopidogrel (PLAVIX) 75 mg, Oral, Every morning   DULoxetine (CYMBALTA) 30 mg, Oral, Daily   escitalopram (LEXAPRO) 20 mg, Oral, BH-each morning   famotidine (PEPCID) 40 mg, Oral, Daily   fluticasone (FLONASE) 50 MCG/ACT nasal spray 2 sprays, Nasal, Daily   folic acid (FOLVITE) 1 mg, Oral, Daily   glipiZIDE (GLUCOTROL) 5 MG tablet 1 tablet, Oral, Daily   losartan (COZAAR) 100 mg, Oral, Daily   metoCLOPramide (REGLAN) 5 mg, 3 times daily   ondansetron (ZOFRAN-ODT) 8 mg, Oral, Every 8 hours PRN   oxycodone (OXY-IR) 5 mg, Oral, 2 times daily PRN   pantoprazole (PROTONIX) 40 mg, Oral, Daily   polyethylene glycol powder (GLYCOLAX/MIRALAX) 17 GM/SCOOP powder Oral   polyvinyl alcohol (LIQUIFILM TEARS) 1.4 % ophthalmic solution Ophthalmic   QC Vitamin D3 2,000 Units, Oral, Daily   sodium chloride (OCEAN) 0.65 % nasal spray Nasal  aspirin EC  81 mg Oral Daily   [START ON 11/05/2022] clopidogrel  75 mg Oral q morning   DULoxetine  30 mg Oral Daily   [START ON 11/05/2022] escitalopram  20 mg Oral BH-q7a   [START ON 11/05/2022] fluticasone  2 spray Each Nare Daily   heparin  5,000 Units Subcutaneous Q12H   losartan  100 mg Oral Daily   melatonin  5 mg Oral QHS   pantoprazole  40 mg Oral Daily   sodium chloride flush  3 mL Intravenous Q12H     [START ON 11/05/2022] azithromycin (ZITHROMAX) 500 mg in sodium chloride 0.9 % 250 mL IVPB     [START ON 11/05/2022] cefTRIAXone (ROCEPHIN)  IV      lactated ringers 50 mL/hr at 11/04/22 2149        ED Course: Pt in Ed placed on 2L [place] and O2 increased to 87%. O2 increased to 3L at this time O2 88%. Given CAP treatment based on CTA chest.  Vitals:   11/04/22 1927 11/04/22 2000 11/04/22 2030 11/04/22 2148  BP: (!) 149/78 135/80 (!) 143/75 (!) 141/76  Pulse: 62 72 69 84  Resp: '18 18 19   '$ Temp:      TempSrc:      SpO2: 98% 98% 100%     Total I/O In: 1000 [IV Piggyback:1000] Out: -  SpO2: 100 % O2 Flow Rate (L/min): 2 L/min Blood work in ed shows: Results for orders placed or performed during the hospital encounter of 11/04/22 (from the past 24 hour(s))  Troponin I (High Sensitivity)     Status: None   Collection Time: 11/04/22  4:14 PM  Result Value Ref Range   Troponin I (High Sensitivity) 4 <18 ng/L  CBC     Status: None   Collection Time: 11/04/22  4:16 PM  Result Value Ref Range   WBC 9.1 4.0 - 10.5 K/uL   RBC 4.86 4.22 - 5.81 MIL/uL   Hemoglobin 14.0 13.0 - 17.0 g/dL   HCT 43.2 39.0 - 52.0 %   MCV 88.9 80.0 - 100.0 fL   MCH 28.8 26.0 - 34.0 pg   MCHC 32.4 30.0 - 36.0 g/dL   RDW 12.8 11.5 - 15.5 %   Platelets 387 150 - 400 K/uL   nRBC 0.0 0.0 - 0.2 %  Urinalysis, Routine w reflex microscopic Urine, Clean Catch     Status: Abnormal   Collection Time: 11/04/22  4:16 PM  Result Value Ref Range   Color, Urine YELLOW (A) YELLOW   APPearance HAZY (A) CLEAR   Specific Gravity, Urine 1.019 1.005 - 1.030   pH 6.0 5.0 - 8.0   Glucose, UA NEGATIVE NEGATIVE mg/dL   Hgb urine dipstick NEGATIVE NEGATIVE   Bilirubin Urine NEGATIVE NEGATIVE   Ketones, ur 5 (A) NEGATIVE mg/dL   Protein, ur 30 (A) NEGATIVE mg/dL   Nitrite NEGATIVE NEGATIVE   Leukocytes,Ua NEGATIVE NEGATIVE   RBC / HPF 0-5 0 - 5 RBC/hpf   WBC, UA 0-5 0 - 5 WBC/hpf   Bacteria, UA NONE SEEN NONE SEEN   Squamous Epithelial / LPF 0-5 0 - 5   Mucus PRESENT    Hyaline Casts, UA PRESENT   Resp Panel by RT-PCR (Flu A&B, Covid) Anterior Nasal Swab     Status:  None   Collection Time: 11/04/22  4:16 PM   Specimen: Anterior Nasal Swab  Result Value Ref Range   SARS Coronavirus 2 by RT PCR NEGATIVE NEGATIVE  Influenza A by PCR NEGATIVE NEGATIVE   Influenza B by PCR NEGATIVE NEGATIVE  Comprehensive metabolic panel     Status: Abnormal   Collection Time: 11/04/22  4:16 PM  Result Value Ref Range   Sodium 134 (L) 135 - 145 mmol/L   Potassium 3.6 3.5 - 5.1 mmol/L   Chloride 97 (L) 98 - 111 mmol/L   CO2 27 22 - 32 mmol/L   Glucose, Bld 142 (H) 70 - 99 mg/dL   BUN 9 8 - 23 mg/dL   Creatinine, Ser 0.75 0.61 - 1.24 mg/dL   Calcium 9.1 8.9 - 10.3 mg/dL   Total Protein 7.1 6.5 - 8.1 g/dL   Albumin 3.1 (L) 3.5 - 5.0 g/dL   AST 23 15 - 41 U/L   ALT 15 0 - 44 U/L   Alkaline Phosphatase 93 38 - 126 U/L   Total Bilirubin 0.8 0.3 - 1.2 mg/dL   GFR, Estimated >60 >60 mL/min   Anion gap 10 5 - 15  Lipase, blood     Status: None   Collection Time: 11/04/22  4:16 PM  Result Value Ref Range   Lipase 32 11 - 51 U/L    Unresulted Labs (From admission, onward)     Start     Ordered   11/05/22 0500  Comprehensive metabolic panel  Tomorrow morning,   STAT        11/04/22 2106   11/05/22 0500  CBC  Tomorrow morning,   STAT        11/04/22 2106   11/04/22 2105  Hemoglobin A1c  Add-on,   AD        11/04/22 2106   11/04/22 2104  Ethanol  Once,   URGENT        11/04/22 2106   11/04/22 1848  Blood culture (routine x 2)  BLOOD CULTURE X 2,   STAT      11/04/22 1847           Pt has received : Orders Placed This Encounter  Procedures   Resp Panel by RT-PCR (Flu A&B, Covid) Anterior Nasal Swab    Standing Status:   Standing    Number of Occurrences:   1   Blood culture (routine x 2)    Standing Status:   Standing    Number of Occurrences:   2   DG Chest Portable 1 View    Standing Status:   Standing    Number of Occurrences:   1    Order Specific Question:   Reason for Exam (SYMPTOM  OR DIAGNOSIS REQUIRED)    Answer:   weakness, hypoxia   CT  Angio Chest PE W and/or Wo Contrast    Standing Status:   Standing    Number of Occurrences:   1    Order Specific Question:   Does the patient have a contrast media/X-ray dye allergy?    Answer:   No    Order Specific Question:   If indicated for the ordered procedure, I authorize the administration of contrast media per Radiology protocol    Answer:   Yes    Order Specific Question:   Radiology Contrast Protocol - do NOT remove file path    Answer:   [email or web address]   CBC    Standing Status:   Standing    Number of Occurrences:   1   Urinalysis, Routine w reflex microscopic    Standing Status:   Standing    Number of Occurrences:  1   Comprehensive metabolic panel    Standing Status:   Standing    Number of Occurrences:   1   Lipase, blood    Standing Status:   Standing    Number of Occurrences:   1   Document Height and Actual Weight    Use scales to weigh patient, not stated or estimated weight.    Standing Status:   Standing    Number of Occurrences:   1   Consult to hospitalist    Standing Status:   Standing    Number of Occurrences:   1    Order Specific Question:   Place call to:    Answer:   hospitalist    Order Specific Question:   Reason for Consult    Answer:   Admit    Order Specific Question:   Diagnosis/Clinical Info for Consult:    Answer:   pneumonia   Airborne and Contact precautions    Standing Status:   Standing    Number of Occurrences:   1   CBG monitoring, ED    Standing Status:   Standing    Number of Occurrences:   1   ED EKG    Altered mental status    Standing Status:   Standing    Number of Occurrences:   1    Order Specific Question:   Reason for Exam    Answer:   Other (See Comments)    Meds ordered this encounter  Medications   sodium chloride 0.9 % bolus 1,000 mL   iohexol (OMNIPAQUE) 350 MG/ML injection 75 mL   azithromycin (ZITHROMAX) 500 mg in sodium chloride 0.9 % 250 mL IVPB    cefTRIAXone (ROCEPHIN) 1 g in sodium chloride 0.9 % 100 mL IVPB    Order Specific Question:   Antibiotic Indication:    Answer:   Other Indication (list below)     Admission Imaging : CT Angio Chest PE W and/or Wo Contrast  Result Date: 11/04/2022 CLINICAL DATA:  Nasopharyngeal carcinoma, prior radiation therapy and chemotherapy. Shortness of breath and hypoxia. Weakness. * Tracking Code: BO * EXAM: CT ANGIOGRAPHY CHEST WITH CONTRAST TECHNIQUE: Multidetector CT imaging of the chest was performed using the standard protocol during bolus administration of intravenous contrast. Multiplanar CT image reconstructions and MIPs were obtained to evaluate the vascular anatomy. RADIATION DOSE REDUCTION: This exam was performed according to the departmental dose-optimization program which includes automated exposure control, adjustment of the mA and/or kV according to patient size and/or use of iterative reconstruction technique. CONTRAST:  50m OMNIPAQUE IOHEXOL 350 MG/ML SOLN COMPARISON:  PET-CT 05/10/2022 FINDINGS: Cardiovascular: No filling defect is identified in the pulmonary arterial tree to suggest pulmonary embolus. Coronary, aortic arch, and branch vessel atherosclerotic vascular disease. Mediastinum/Nodes: Calcified lymph nodes from old granulomatous disease. Borderline prominent 1.0 cm right hilar lymph node on image 80 series 4, nonspecific. Lungs/Pleura: Centrilobular emphysema. Subsegmental atelectasis in the posterior basal segment right lower lobe. Mild peripheral airspace opacity with associated marginal reticulation in both lower lobes, query aspiration pneumonitis or mild bilateral pneumonia. Upper Abdomen: Punctate calcifications in the liver and spleen compatible with old granulomatous disease. Asymmetric right renal atrophy. Substantial atheromatous plaque at the origins of the celiac trunk and SMA, patency of the structures is not established, degree of stenosis not established. Musculoskeletal:  Old healed right lateral rib fractures. Review of the MIP images confirms the above findings.  IMPRESSION:  1. No filling defect is identified in the pulmonary  arterial tree to suggest pulmonary embolus.  2. Mild peripheral airspace opacity with associated marginal reticulation in both lower lobes, query aspiration pneumonitis or mild bilateral pneumonia.  3. Borderline prominent 1.0 cm right hilar lymph node, nonspecific.  4. Old granulomatous disease.  5. Asymmetric right renal atrophy.  6. Substantial atheromatous plaque at the origins of the celiac trunk and SMA, patency of the structures is not established.  7. Aortic atherosclerosis.  8. Emphysema. Aortic Atherosclerosis (ICD10-I70.0) and Emphysema (ICD10-J43.9).  Electronically Signed   By: Van Clines M.D.   On: 11/04/2022 18:28   DG Chest Portable 1 View  Result Date: 11/04/2022 CLINICAL DATA:  Weakness. EXAM: PORTABLE CHEST 1 VIEW COMPARISON:  Chest radiograph 10/24/2020 FINDINGS: Interval removal right chest wall Port-A-Cath. Stable cardiac and mediastinal contours. Aortic atherosclerosis. No large area pulmonary consolidation. No pleural effusion or pneumothorax. Thoracic spine degenerative changes. IMPRESSION: No acute cardiopulmonary process. Electronically Signed   By: Lovey Newcomer M.D.   On: 11/04/2022 16:16   Physical Examination: Vitals:   11/04/22 1927 11/04/22 2000 11/04/22 2030 11/04/22 2148  BP: (!) 149/78 135/80 (!) 143/75 (!) 141/76  Pulse: 62 72 69 84  Temp:      Resp: '18 18 19   '$ SpO2: 98% 98% 100%   TempSrc:       Physical Exam Vitals and nursing note reviewed.  Constitutional:      General: He is not in acute distress.    Appearance: Normal appearance. He is not ill-appearing, toxic-appearing or diaphoretic.  HENT:     Head: Normocephalic and atraumatic.     Right Ear: Hearing and external ear normal.     Left Ear: Hearing and external ear normal.     Nose: Nose normal. No nasal deformity.      Mouth/Throat:     Lips: Pink.     Mouth: Mucous membranes are moist.     Tongue: No lesions.     Pharynx: Oropharynx is clear.  Eyes:     General: Lids are normal.     Extraocular Movements: Extraocular movements intact.     Pupils: Pupils are equal, round, and reactive to light.     Comments: Anisocoria left pupil 3-4 mm  and rt one is 2 mm.   Neck:     Vascular: No carotid bruit.  Cardiovascular:     Rate and Rhythm: Normal rate and regular rhythm.     Pulses: Normal pulses.     Heart sounds: Normal heart sounds.  Pulmonary:     Effort: Pulmonary effort is normal.     Breath sounds: Wheezing and rhonchi present.  Abdominal:     General: Bowel sounds are normal. There is no distension.     Palpations: Abdomen is soft. There is no mass.     Tenderness: There is no abdominal tenderness. There is no guarding.     Hernia: No hernia is present.  Musculoskeletal:     Right lower leg: No edema.     Left lower leg: No edema.  Skin:    General: Skin is warm.  Neurological:     General: No focal deficit present.     Mental Status: He is alert and oriented to person, place, and time.     Cranial Nerves: Cranial nerves 2-12 are intact.     Motor: Motor function is intact.  Psychiatric:        Attention and Perception: Attention normal.        Mood and Affect: Mood  normal.        Speech: Speech normal.        Behavior: Behavior normal. Behavior is cooperative.        Cognition and Memory: Cognition normal.     Assessment and Plan: * Generalized weakness Attributed to deconditioning and electrolyte issues. Suspect underlying vitamin D deficiency possibly hypogonadism and circulation issues.  Frequent falls Suspect combination of neuropathy as well as circulation patient has decreased pulses bilaterally and chart review shows absent pedal pulses however I was able to feel swollen in both feet. Will get physical therapy consult.  Tobacco use disorder Nicotine patch.  Avoid  excessive o2.    Dyslipidemia Continue with atorvastatin 80 mg.   Absent pulse in lower extremity Patient has decreased pedal pulses in both legs. Pulses present are 1+ both feet.    Acid reflux IV PPI therapy.   Anisocoria Both eyes left pupil dilated to light , no nystagmus Head CT negative for any acute bleed. .    Abdominal pain Pt states he has left sided abd pain that is chronic. 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  Review shows patient's had a CT angio of the abdomen and aorta runoff in 2017 showing: No focal stenosis. Suspect he may need nonurgent angio eval of mesenteric circulation to evaluate for abdominal angina.    Respiratory distress Attribute to aspiration pneumonia.  We will do a swallow eval.    Protein-calorie malnutrition, severe Will get A1c. Not sure if pt has DM or PCM.  We will hold glipizide. Start Glycemic protocol.    Diabetes mellitus without complication ([place]) Will get an A1c. Following up Depression hyperglycemia cover with sliding scale glycemic protocol.  COPD (chronic obstructive pulmonary disease) ([place]) Patient currently continues to smoke states he is cutting down we will get a nicotine patch. As needed albuterol and DuoNebs as needed  HTN (hypertension) Vitals:   11/04/22 1525 11/04/22 1927 11/04/22 2000 11/04/22 2030  BP: 114/82 (!) 149/78 135/80 (!) 143/75  Losartan and benztropine  Alcohol abuse Will get a stat ethanol level. Thiamine 1 mg daily. CIWA protocol.    DVT prophylaxis:  Heparin  Code Status:  Full Code   Family Communication:  Ernestina Patches (POA) (Daughter) [phone]  Disposition Plan:  Home   Consults called:  None  Admission status: Inpatient   Unit/ Expected LOS: Telemetry / 2 days.    Para Skeans MD [facility]  6 PM- 2 AM. Please contact me via secure Chat 6 PM-2 AM. [phone]( Pager ) To contact the [facility] Attending or Consulting provider [place] or covering provider  during after hours [place], for this patient.   Check the care team in [facility] and look for a) attending/consulting [facility] provider listed and b) the [facility] team listed Log into [email or web address] and use [place]'s universal password to access. If you do not have the password, please contact the hospital operator. Locate the [facility] provider you are looking for under [facility] and page to a number that you can be directly reached. If you still have difficulty reaching the provider, please page the [facility] (Director on Call) for the Hospitalists listed on amion for assistance. [email or web address] 11/04/2022, 9:56 PM

## 2022-11-04 NOTE — Assessment & Plan Note (Signed)
Attribute to aspiration pneumonia.  We will do a swallow eval.

## 2022-11-04 NOTE — Assessment & Plan Note (Addendum)
Suspect combination of neuropathy as well as circulation patient has decreased pulses bilaterally and chart review shows absent pedal pulses however I was able to feel swollen in both feet. Will get physical therapy consult.

## 2022-11-04 NOTE — Assessment & Plan Note (Signed)
Both eyes left pupil dilated to light , no nystagmus Head CT negative for any acute bleed. Marland Kitchen

## 2022-11-04 NOTE — Assessment & Plan Note (Addendum)
Will get an A1c. Following up Depression hyperglycemia cover with sliding scale glycemic protocol.

## 2022-11-05 ENCOUNTER — Observation Stay: Payer: Medicare Other

## 2022-11-05 DIAGNOSIS — E43 Unspecified severe protein-calorie malnutrition: Secondary | ICD-10-CM | POA: Diagnosis not present

## 2022-11-05 DIAGNOSIS — R531 Weakness: Secondary | ICD-10-CM | POA: Diagnosis not present

## 2022-11-05 DIAGNOSIS — J449 Chronic obstructive pulmonary disease, unspecified: Secondary | ICD-10-CM

## 2022-11-05 DIAGNOSIS — I739 Peripheral vascular disease, unspecified: Secondary | ICD-10-CM | POA: Diagnosis not present

## 2022-11-05 DIAGNOSIS — J189 Pneumonia, unspecified organism: Secondary | ICD-10-CM

## 2022-11-05 DIAGNOSIS — R5381 Other malaise: Secondary | ICD-10-CM | POA: Diagnosis present

## 2022-11-05 LAB — CBC
HCT: 37.3 % — ABNORMAL LOW (ref 39.0–52.0)
Hemoglobin: 12.4 g/dL — ABNORMAL LOW (ref 13.0–17.0)
MCH: 28.9 pg (ref 26.0–34.0)
MCHC: 33.2 g/dL (ref 30.0–36.0)
MCV: 86.9 fL (ref 80.0–100.0)
Platelets: 335 10*3/uL (ref 150–400)
RBC: 4.29 MIL/uL (ref 4.22–5.81)
RDW: 12.8 % (ref 11.5–15.5)
WBC: 6.5 10*3/uL (ref 4.0–10.5)
nRBC: 0 % (ref 0.0–0.2)

## 2022-11-05 LAB — COMPREHENSIVE METABOLIC PANEL
ALT: 12 U/L (ref 0–44)
AST: 16 U/L (ref 15–41)
Albumin: 2.7 g/dL — ABNORMAL LOW (ref 3.5–5.0)
Alkaline Phosphatase: 77 U/L (ref 38–126)
Anion gap: 8 (ref 5–15)
BUN: 7 mg/dL — ABNORMAL LOW (ref 8–23)
CO2: 24 mmol/L (ref 22–32)
Calcium: 8.4 mg/dL — ABNORMAL LOW (ref 8.9–10.3)
Chloride: 103 mmol/L (ref 98–111)
Creatinine, Ser: 0.62 mg/dL (ref 0.61–1.24)
GFR, Estimated: >60 mL/min (ref >60)
Glucose, Bld: 112 mg/dL — ABNORMAL HIGH (ref 70–99)
Potassium: 3.3 mmol/L — ABNORMAL LOW (ref 3.5–5.1)
Sodium: 135 mmol/L (ref 135–145)
Total Bilirubin: 0.7 mg/dL (ref 0.3–1.2)
Total Protein: 6.2 g/dL — ABNORMAL LOW (ref 6.5–8.1)

## 2022-11-05 LAB — PROCALCITONIN: Procalcitonin: <0.1 ng/mL

## 2022-11-05 LAB — TSH: TSH: 1.454 u[IU]/mL (ref 0.350–4.500)

## 2022-11-05 MED ORDER — AZITHROMYCIN 500 MG PO TABS: 500.0000 mg | ORAL_TABLET | Freq: Every day | ORAL | Status: DC

## 2022-11-05 MED ORDER — GUAIFENESIN-DM 100-10 MG/5ML PO SYRP
5.0000 mL | ORAL_SOLUTION | ORAL | Status: DC | PRN
  Administered 2022-11-05 – 2022-11-06 (×2): 5 mL via ORAL
  Filled 2022-11-05 (×2): qty 10

## 2022-11-05 MED ORDER — PREDNISONE 20 MG PO TABS
40.0000 mg | ORAL_TABLET | Freq: Every day | ORAL | Status: DC
  Administered 2022-11-06 – 2022-11-07 (×2): 40 mg via ORAL
  Filled 2022-11-05 (×2): qty 2

## 2022-11-05 MED ORDER — ONDANSETRON HCL 4 MG/2ML IJ SOLN
4.0000 mg | Freq: Once | INTRAMUSCULAR | Status: AC
  Administered 2022-11-05: 4 mg via INTRAVENOUS
  Filled 2022-11-05: qty 2

## 2022-11-05 MED ORDER — ONDANSETRON HCL 4 MG/2ML IJ SOLN: 4.0000 mg | Freq: Four times a day (QID) | INTRAMUSCULAR | Status: DC | PRN

## 2022-11-05 MED ORDER — POTASSIUM CHLORIDE CRYS ER 20 MEQ PO TBCR
40.0000 meq | EXTENDED_RELEASE_TABLET | Freq: Once | ORAL | Status: AC
  Administered 2022-11-05: 40 meq via ORAL
  Filled 2022-11-05: qty 2

## 2022-11-05 MED ORDER — ADULT MULTIVITAMIN W/MINERALS CH
1.0000 | ORAL_TABLET | Freq: Every day | ORAL | Status: DC
  Administered 2022-11-05 – 2022-11-07 (×3): 1 via ORAL
  Filled 2022-11-05 (×3): qty 1

## 2022-11-05 NOTE — Progress Notes (Signed)
[facility] Progress Note  Patient: Kenneth Franklin    OEV:[id number]  DOA: 11/04/2022    Date of Service: the patient was seen and examined on 11/05/2022  Brief hospital course: 76 year old male with past medical history of nasopharyngeal carcinoma diagnosed 2 years ago not on any treatment, as well as chronic mesenteric ischemia PVD, diabetes mellitus, hypertension and depression who presented to the emergency room on 12/8 with complaints of shortness of breath and weakness.  On admission, patient found to be very briefly hypoxic requiring 3 L initiall, but able to be changed over to room air.  Workup revealed possible pneumonia.  Patient admitted to the hospitalist service.   Assessment and Plan: * Generalized weakness Attributed to deconditioning and electrolyte issues.  And overall deconditioning.  May end up needing skilled nursing.  Physical and Occupational Therapy to see.  Check TSH.  Suspect underlying vitamin D deficiency possibly hypogonadism and circulation issues.  Frequent falls Suspect combination of neuropathy as well as circulation patient has decreased pulses bilaterally and chart review shows absent pedal pulses however I was able to feel swollen in both feet. Will get physical therapy consult.  COPD (chronic obstructive pulmonary disease) ([place]) Will try some low-dose steroids and nebulizers.  Although the reports of some initial mild hypoxia, this quickly resolved.  No evidence of acute infection given normal procalcitonin.  Abdominal pain Pt states he has left sided abd pain that is chronic. 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  Review shows patient's had a CT angio of the abdomen and aorta runoff in 2017 showing: No focal stenosis. Suspect he may need nonurgent angio eval of mesenteric circulation to evaluate for abdominal angina.    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   PET scan in June seems to confirm this.  No evidence of recurrence or metastases at this time.  HTN (hypertension) Vitals:   11/04/22 1525 11/04/22 1927 11/04/22 2000 11/04/22 2030  BP: 114/82 (!) 149/78 135/80 (!) 143/75  Losartan and benztropine  Diabetes mellitus without complication ([place]) Will get an A1c. Following up Depression hyperglycemia cover with sliding scale glycemic protocol.  Alcohol abuse Denies current alcohol use   Anxiety and depression Continue home medications  Dyslipidemia Continue with atorvastatin 80 mg.   Tobacco use disorder Nicotine patch.  Avoid excessive o2.    Acid reflux IV PPI therapy.   Anisocoria Both eyes left pupil dilated to light , no nystagmus Head CT negative for any acute bleed. .    Protein-calorie malnutrition, severe Nutrition consult.        There is no height or weight on file to calculate BMI.        Consultants: None  Procedures: None  Antimicrobials: IV Rocephin and Zithromax x 112/9 only  Code Status: Full code   Subjective: Feels very weak  Objective: Vital signs were reviewed and unremarkable. Vitals:   11/05/22 0830 11/05/22 1400  BP: (!) 143/74 (!) 151/67  Pulse: (!) 58 60  Resp:  17  Temp:  98.7 F (37.1 C)  SpO2: 92% 93%    Intake/Output Summary (Last 24 hours) at 11/05/2022 1706 Last data filed at 11/04/2022 1906 Gross per 24 hour  Intake 1000 ml  Output --  Net 1000 ml   There were no vitals filed for this visit. There is no height or weight on file to calculate BMI.  Exam:  General: Alert and oriented x2 , fatigued HEENT: Normocephalic, atraumatic, mucous membranes  slightly dry Cardiovascular: Regular rate and rhythm, S1-S2 Respiratory: Decreased breath sounds throughout, no crackles or wheezes Abdomen: Soft, nontender, nondistended, positive bowel sounds Musculoskeletal: No clubbing or cyanosis or edema Skin: No skin breaks, tears or lesions Psychiatry:  Appropriate, no evidence of psychoses Neurology: No focal deficits  Data Reviewed: Potassium 3.3, albumin 2.7 and normal procalcitonin.  Disposition:  Status is: Observation     Anticipated discharge date: 12/11  Remaining issues to be resolved so that patient can be discharged:  -Nutrition evaluation -Physical therapy evaluation   Family Communication: Will call family DVT Prophylaxis: heparin injection 5,000 Units Start: 11/04/22 2200    Author: Annita Brod ,MD 11/05/2022 5:06 PM  To reach On-call, see care teams to locate the attending and reach out via [email or web address]. Between 7PM-7AM, please contact night-coverage If you still have difficulty reaching the attending provider, please page the [facility] (Director on Call) for [facility] on amion for assistance.

## 2022-11-05 NOTE — Evaluation (Signed)
Physical Therapy Evaluation Patient Details Name: ANAKIN VARKEY MRN: [id number] DOB: January 03, 1946 Today's Date: 11/05/2022  History of Present Illness  Kenneth Franklin is a 76 y.o. male who presents to the emergency department today because of concerns for weakness.  The patient states that he has issues with declining strength for some time although it is worsened recently.  This has been accompanied by decreased appetite and nausea.  Patient denies any associated abdominal pain with nausea.  Today the patient felt so weak that he was having a hard time getting out of his bed  Clinical Impression  Patient received in ED on stretcher. Agrees to PT assessment. He reports he is feeling awful and nauseated. Coughing during session. He is mod I with bed mobility and transfers with min guard A. He is able to take a few steps at edge of bed with min guard but is limited by weakness and feeling poorly. Patient will continue to benefit from skilled PT while here to improve strength and functional independence for hopeful return home at discharge.        Recommendations for follow up therapy are one component of a multi-disciplinary discharge planning process, led by the attending physician.  Recommendations may be updated based on patient status, additional functional criteria and insurance authorization.  Follow Up Recommendations Home health PT Can patient physically be transported by private vehicle: Yes    Assistance Recommended at Discharge Intermittent Supervision/Assistance  Patient can return home with the following  A little help with walking and/or transfers;A little help with bathing/dressing/bathroom;Assist for transportation;Help with stairs or ramp for entrance;Assistance with cooking/housework    Equipment Recommendations None recommended by PT  Recommendations for Other Services       Functional Status Assessment Patient has had a recent decline in their functional status and  demonstrates the ability to make significant improvements in function in a reasonable and predictable amount of time.     Precautions / Restrictions Precautions Precautions: Fall Restrictions Weight Bearing Restrictions: No      Mobility  Bed Mobility Overal bed mobility: Independent             General bed mobility comments: upon sitting up on side of bed patient leaning forward, not feeling well    Transfers Overall transfer level: Needs assistance Equipment used: None Transfers: Sit to/from Stand Sit to Stand: Min guard                Ambulation/Gait Ambulation/Gait assistance: Min guard Gait Distance (Feet): 4 Feet Assistive device: None Gait Pattern/deviations: Step-to pattern Gait velocity: WNL     General Gait Details: patient able to take a few steps along edge of bed.  Stairs            Wheelchair Mobility    Modified Rankin (Stroke Patients Only)       Balance Overall balance assessment: Needs assistance Sitting-balance support: Feet supported Sitting balance-Leahy Scale: Good     Standing balance support: No upper extremity supported, During functional activity Standing balance-Leahy Scale: Fair Standing balance comment: requires min guard for safety with mobility due to weakness                             Pertinent Vitals/Pain Pain Assessment Pain Assessment: No/denies pain    Home Living Family/patient expects to be discharged to:: Private residence Living Arrangements: Alone   Type of Home: House Home Access: Ramped entrance  Home Layout: One level Home Equipment: Conservation officer, nature (2 wheels);Cane - single point      Prior Function Prior Level of Function : Independent/Modified Independent;Driving             Mobility Comments: states he was not using AD until recently due to weakness ADLs Comments: independent     Hand Dominance        Extremity/Trunk Assessment   Upper Extremity  Assessment Upper Extremity Assessment: Generalized weakness    Lower Extremity Assessment Lower Extremity Assessment: Generalized weakness    Cervical / Trunk Assessment Cervical / Trunk Assessment: Normal  Communication   Communication: HOH  Cognition Arousal/Alertness: Awake/alert Behavior During Therapy: WFL for tasks assessed/performed Overall Cognitive Status: Within Functional Limits for tasks assessed                                          General Comments      Exercises     Assessment/Plan    PT Assessment Patient needs continued PT services  PT Problem List Decreased strength;Decreased activity tolerance;Decreased balance;Decreased mobility;Cardiopulmonary status limiting activity;Decreased cognition       PT Treatment Interventions DME instruction;Therapeutic exercise;Gait training;Balance training;Stair training;Functional mobility training;Therapeutic activities;Patient/family education    PT Goals (Current goals can be found in the Care Plan section)  Acute Rehab PT Goals Patient Stated Goal: to improve PT Goal Formulation: With patient Time For Goal Achievement: 11/19/22 Potential to Achieve Goals: Good    Frequency Min 2X/week     Co-evaluation               AM-PAC PT "6 Clicks" Mobility  Outcome Measure Help needed turning from your back to your side while in a flat bed without using bedrails?: None Help needed moving from lying on your back to sitting on the side of a flat bed without using bedrails?: None Help needed moving to and from a bed to a chair (including a wheelchair)?: A Little Help needed standing up from a chair using your arms (e.g., wheelchair or bedside chair)?: A Little Help needed to walk in hospital room?: A Lot Help needed climbing 3-5 steps with a railing? : A Lot 6 Click Score: 18    End of Session   Activity Tolerance: Patient limited by fatigue Patient left: in bed;with call bell/phone within  reach Nurse Communication: Mobility status PT Visit Diagnosis: Unsteadiness on feet (R26.81);Muscle weakness (generalized) (M62.81);Difficulty in walking, not elsewhere classified (R26.2)    Time: 1950-9326 PT Time Calculation (min) (ACUTE ONLY): 14 min   Charges:   PT Evaluation $PT Eval Low Complexity: 1 Low          Kierstan Auer, PT, GCS 11/05/22,3:17 PM

## 2022-11-05 NOTE — Assessment & Plan Note (Signed)
According to patient, was successfully treated with radiation and chemotherapy several years ago.  PET scan in June seems to confirm this.  No evidence of recurrence or metastases at this time.

## 2022-11-05 NOTE — Progress Notes (Signed)
PHARMACIST - PHYSICIAN COMMUNICATION  CONCERNING: Antibiotic IV to Oral Route Change Policy  RECOMMENDATION: This patient is receiving azithromycin by the intravenous route.  Based on criteria approved by the Pharmacy and Therapeutics Committee, the antibiotic(s) is/are being converted to the equivalent oral dose form(s).   DESCRIPTION: These criteria include: Patient being treated for a respiratory tract infection, urinary tract infection, cellulitis or clostridium difficile associated diarrhea if on metronidazole The patient is not neutropenic and does not exhibit a GI malabsorption state The patient is eating (either orally or via tube) and/or has been taking other orally administered medications for a least 24 hours The patient is improving clinically and has a Tmax < 100.5  If you have questions about this conversion, please contact the [facility]  11/05/22

## 2022-11-05 NOTE — Assessment & Plan Note (Signed)
-   Continue home medications 

## 2022-11-05 NOTE — Hospital Course (Addendum)
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  On admission, patient found to be very briefly hypoxic requiring 3 L initiall, but able to be changed over to room air.  Workup revealed possible pneumonia.  Patient admitted to the hospitalist service.

## 2022-11-05 NOTE — Evaluation (Signed)
Occupational Therapy Evaluation Patient Details Name: Kenneth Franklin MRN: [id number] DOB: 1946/02/05 Today's Date: 11/05/2022   History of Present Illness Kenneth Franklin is a 76 y.o. male who presents to the emergency department today because of concerns for weakness.  The patient states that he has issues with declining strength for some time although it is worsened recently.  This has been accompanied by decreased appetite and nausea.  Patient denies any associated abdominal pain with nausea.  Today the patient felt so weak that he was having a hard time getting out of his bed   Clinical Impression   Patient seen for OT evaluation, family present. Pt presenting with decreased independence in self care, balance, functional mobility/transfers, and endurance. Pt currently functioning at independent for bed mobility, Min guard for STS from EOB and toilet, Min guard for functional mobility at room level with no AD, and Min A for LB dressing. Pt will benefit from acute OT to increase overall independence in the areas of ADLs and functional mobility in order to safely discharge home. Pt could benefit from [facility] following D/C to decrease falls risk, improve balance, and maximize independence in self-care within own home environment.    Recommendations for follow up therapy are one component of a multi-disciplinary discharge planning process, led by the attending physician.  Recommendations may be updated based on patient status, additional functional criteria and insurance authorization.   Follow Up Recommendations  Home health OT     Assistance Recommended at Discharge Intermittent Supervision/Assistance  Patient can return home with the following A little help with walking and/or transfers;A little help with bathing/dressing/bathroom;Assistance with cooking/housework;Assist for transportation;Help with stairs or ramp for entrance    Functional Status Assessment  Patient has had a recent decline in  their functional status and demonstrates the ability to make significant improvements in function in a reasonable and predictable amount of time.  Equipment Recommendations  None recommended by OT    Recommendations for Other Services       Precautions / Restrictions Precautions Precautions: Fall Restrictions Weight Bearing Restrictions: No      Mobility Bed Mobility Overal bed mobility: Independent                  Transfers Overall transfer level: Needs assistance Equipment used: None Transfers: Sit to/from Stand Sit to Stand: Min guard           General transfer comment: STS from EOB and toilet      Balance Overall balance assessment: Needs assistance Sitting-balance support: Feet supported Sitting balance-Leahy Scale: Good     Standing balance support: No upper extremity supported, During functional activity Standing balance-Leahy Scale: Fair Standing balance comment: requires min guard for safety with mobility due to weakness                           ADL either performed or assessed with clinical judgement   ADL Overall ADL's : Needs assistance/impaired     Grooming: Set up;Sitting               Lower Body Dressing: Minimal assistance;Sitting/lateral leans Lower Body Dressing Details (indicate cue type and reason): to don shoes Toilet Transfer: Ambulation;Regular Toilet;Grab bars;Min guard           Functional mobility during ADLs: Min guard (at room level, no AD)       Vision Patient Visual Report: No change from baseline  Perception     Praxis      Pertinent Vitals/Pain Pain Assessment Pain Assessment: No/denies pain     Hand Dominance     Extremity/Trunk Assessment Upper Extremity Assessment Upper Extremity Assessment: Generalized weakness   Lower Extremity Assessment Lower Extremity Assessment: Generalized weakness (pt reports bottom of his feet are numb)   Cervical / Trunk Assessment Cervical  / Trunk Assessment: Normal   Communication Communication Communication: HOH   Cognition Arousal/Alertness: Awake/alert Behavior During Therapy: WFL for tasks assessed/performed Overall Cognitive Status: Within Functional Limits for tasks assessed                                       General Comments  pt desatting to 80s on RA (likely due to poor pleth at times), educated on pursed lip breathing & recovered quickly    Exercises Other Exercises Other Exercises: OT provided education re: role of OT, OT POC, post acute recs, sitting up for all meals, EOB/OOB mobility with assistance, home/fall safety.     Shoulder Instructions      Home Living Family/patient expects to be discharged to:: Private residence Living Arrangements: Alone Available Help at Discharge: Family;Available PRN/intermittently Type of Home: House Home Access: Ramped entrance     Home Layout: One level     Bathroom Shower/Tub: Teacher, early years/pre: Standard     Home Equipment: Conservation officer, nature (2 wheels);Cane - single point;BSC/3in1;Grab bars - tub/shower          Prior Functioning/Environment Prior Level of Function : Independent/Modified Independent;Driving             Mobility Comments: states he was not using AD until recently due to weakness ADLs Comments: independent, uses BSC frame over toilet, pt has aide 24/7 to assist with ADLs/IADLs as needed        OT Problem List: Decreased strength;Decreased activity tolerance;Impaired balance (sitting and/or standing);Cardiopulmonary status limiting activity;Decreased knowledge of use of DME or AE;Impaired sensation      OT Treatment/Interventions: Self-care/ADL training;Therapeutic exercise;Therapeutic activities;Energy conservation;DME and/or AE instruction;Patient/family education;Balance training    OT Goals(Current goals can be found in the care plan section) Acute Rehab OT Goals Patient Stated Goal: go home OT  Goal Formulation: With patient/family Time For Goal Achievement: 11/19/22 Potential to Achieve Goals: Good  OT Frequency: Min 2X/week    Co-evaluation              AM-PAC OT "6 Clicks" Daily Activity     Outcome Measure Help from another person eating meals?: None Help from another person taking care of personal grooming?: A Little Help from another person toileting, which includes using toliet, bedpan, or urinal?: A Little Help from another person bathing (including washing, rinsing, drying)?: A Little Help from another person to put on and taking off regular upper body clothing?: A Little Help from another person to put on and taking off regular lower body clothing?: A Little 6 Click Score: 19   End of Session Nurse Communication: Mobility status;Other (comment) (step daughter wanting to speak with RN to find out if pt is being admitted)  Activity Tolerance: Patient tolerated treatment well Patient left: in bed;with call bell/phone within reach;with family/visitor present  OT Visit Diagnosis: Unsteadiness on feet (R26.81);Muscle weakness (generalized) (M62.81)                Time: 5732-2025 OT Time Calculation (min): 19 min Charges:  OT General Charges $OT Visit: 1 Visit OT Evaluation $OT Eval Low Complexity: 1 Low  [facility] MS, OTR/L ascom [phone]  11/05/22, 6:26 PM

## 2022-11-06 DIAGNOSIS — C119 Malignant neoplasm of nasopharynx, unspecified: Secondary | ICD-10-CM

## 2022-11-06 DIAGNOSIS — R296 Repeated falls: Secondary | ICD-10-CM

## 2022-11-06 DIAGNOSIS — J449 Chronic obstructive pulmonary disease, unspecified: Secondary | ICD-10-CM | POA: Diagnosis not present

## 2022-11-06 DIAGNOSIS — R531 Weakness: Secondary | ICD-10-CM | POA: Diagnosis not present

## 2022-11-06 LAB — BASIC METABOLIC PANEL
Anion gap: 6 (ref 5–15)
BUN: 9 mg/dL (ref 8–23)
CO2: 26 mmol/L (ref 22–32)
Calcium: 8.9 mg/dL (ref 8.9–10.3)
Chloride: 104 mmol/L (ref 98–111)
Creatinine, Ser: 0.63 mg/dL (ref 0.61–1.24)
GFR, Estimated: >60 mL/min (ref >60)
Glucose, Bld: 113 mg/dL — ABNORMAL HIGH (ref 70–99)
Potassium: 4.2 mmol/L (ref 3.5–5.1)
Sodium: 136 mmol/L (ref 135–145)

## 2022-11-06 LAB — MAGNESIUM: Magnesium: 1.9 mg/dL (ref 1.7–2.4)

## 2022-11-06 MED ORDER — IPRATROPIUM-ALBUTEROL 0.5-2.5 (3) MG/3ML IN SOLN
3.0000 mL | Freq: Three times a day (TID) | RESPIRATORY_TRACT | Status: DC
  Administered 2022-11-06 (×2): 3 mL via RESPIRATORY_TRACT
  Filled 2022-11-06 (×2): qty 3

## 2022-11-06 MED ORDER — IPRATROPIUM-ALBUTEROL 0.5-2.5 (3) MG/3ML IN SOLN
3.0000 mL | Freq: Three times a day (TID) | RESPIRATORY_TRACT | Status: DC
  Administered 2022-11-06 – 2022-11-07 (×2): 3 mL via RESPIRATORY_TRACT
  Filled 2022-11-06 (×2): qty 3

## 2022-11-06 NOTE — Progress Notes (Signed)
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  On admission, patient found to be very briefly hypoxic requiring 3 L initiall, but able to be changed over to room air.  Workup revealed possible pneumonia.  Patient admitted to the hospitalist service.   Assessment and Plan: * Generalized weakness Attributed to deconditioning and electrolyte issues.  And overall deconditioning.  May end up needing skilled nursing.  PT OT recommending home health.  TSH within normal limits.  Suspect underlying vitamin D deficiency possibly hypogonadism and circulation issues.  Frequent falls Suspect combination of neuropathy as well as circulation patient has decreased pulses bilaterally and chart review shows absent pedal pulses however I was able to feel swollen in both feet.  Home health PT.  COPD (chronic obstructive pulmonary disease) ([place]) Will try some low-dose steroids and scheduled plus as needed nebulizers.  Although the reports of some initial mild hypoxia, this quickly resolved.  No evidence of acute infection given normal procalcitonin.  Breathing slowly improving.  Nasopharyngeal carcinoma ([place]) According to patient, was successfully treated with radiation and chemotherapy several years ago.  PET scan in June seems to confirm this.  No evidence of recurrence or metastases at this time.  Abdominal pain-resolved as of 11/06/2022 Pt states he has left sided abd pain that is chronic, which I suspect is chronic mesenteric ischemia.  CT angio of the abdomen and aorta runoff  in 2017 showing no focal area of stenosis.  Patient denies any pain currently.   HTN (hypertension) Vitals:   11/04/22 1525 11/04/22 1927 11/04/22 2000 11/04/22 2030  BP: 114/82 (!) 149/78 135/80 (!) 143/75  Losartan and benztropine  Diabetes mellitus without complication (HCC) T0W pending.  Continue sliding scale.  Alcohol abuse Denies current alcohol use   Anxiety and depression Continue home medications  Dyslipidemia Continue with atorvastatin 80 mg.   Tobacco use disorder Nicotine patch.  Avoid excessive o2.    Acid reflux IV PPI therapy.   Anisocoria Both eyes left pupil dilated to light , no nystagmus Head CT negative for any acute bleed. .    Protein-calorie malnutrition, severe Nutrition consult.        Body mass index is 16.71 kg/m.        Consultants: None  Procedures: None  Antimicrobials: IV Rocephin and Zithromax x 112/9 only  Code Status: Full code   Subjective: Feels very weak  Objective: Vital signs were reviewed and unremarkable. Vitals:   11/06/22 0753 11/06/22 1148  BP: 127/79 123/74  Pulse: 71 76  Resp: 16 17  Temp: 97.6 F (36.4 C) 97.9 F (36.6 C)  SpO2: 96% 96%    Intake/Output Summary (Last 24 hours) at 11/06/2022 1240 Last data filed at 11/06/2022 0801 Gross per 24 hour  Intake 360 ml  Output 250 ml  Net 110 ml    Filed Weights   11/06/22 0500  Weight: 55.9 kg   Body mass index is 16.71 kg/m.  Exam:  General: Alert and oriented x2 , fatigued HEENT: Normocephalic, atraumatic, mucous membranes slightly dry Cardiovascular: Regular rate and rhythm, S1-S2 Respiratory: Decreased breath  sounds throughout, no crackles or wheezes Abdomen: Soft, nontender, nondistended, positive bowel sounds Musculoskeletal: No clubbing or cyanosis or edema Skin: No skin breaks, tears or lesions Psychiatry: Appropriate, no evidence of psychoses Neurology: No focal deficits  Data Reviewed: Normal labs  today  Disposition:  Status is: Observation     Anticipated discharge date: 12/11  Remaining issues to be resolved so that patient can be discharged:  -Nutrition evaluation -Improvement in breathing   Family Communication: Will call family DVT Prophylaxis: heparin injection 5,000 Units Start: 11/04/22 2200    Author: Annita Brod ,MD 11/06/2022 12:40 PM  To reach On-call, see care teams to locate the attending and reach out via [email or web address]. Between 7PM-7AM, please contact night-coverage If you still have difficulty reaching the attending provider, please page the [facility] (Director on Call) for [facility] on amion for assistance.

## 2022-11-06 NOTE — TOC Initial Note (Signed)
Transition of Care [facility]) - Initial/Assessment Note    Patient Details  Name: Kenneth Franklin MRN: [id number] Date of Birth: 09/10/1946  [facility]) CM/SW Contact:    [address], [place] Phone Number: 11/06/2022, 11:59 AM  Clinical Narrative:                  CSW spoke with pt's daughter, she states pt had [facility] services about two years ago after dc from [facility]. Does not have a preference for services, CSW advised Medicare.gov website for more information on facility ratings. CSW reached out to [facility], confirmed they could provide PT/OT services for pt.         Patient Goals and CMS Choice        Expected Discharge Plan and Services                                                Prior Living Arrangements/Services                       Activities of Daily Living Home Assistive Devices/Equipment: Cane (specify quad or straight), Walker (specify type), Wheelchair ADL Screening (condition at time of admission) Patient's cognitive ability adequate to safely complete daily activities?: Yes Is the patient deaf or have difficulty hearing?: No Does the patient have difficulty seeing, even when wearing glasses/contacts?: No Does the patient have difficulty concentrating, remembering, or making decisions?: No Patient able to express need for assistance with ADLs?: Yes Does the patient have difficulty dressing or bathing?: No Independently performs ADLs?: Yes (appropriate for developmental age) Does the patient have difficulty walking or climbing stairs?: Yes Weakness of Legs: Both Weakness of Arms/Hands: None  Permission Sought/Granted                  Emotional Assessment              Admission diagnosis:  Weakness [R53.1] Physical deconditioning [R53.81] Generalized weakness [R53.1] Pneumonia due to infectious organism, unspecified laterality, unspecified part of lung [J18.9] Patient Active Problem List   Diagnosis Date Noted    PVD (peripheral vascular disease) ([place]) 11/05/2022   CAP (community acquired pneumonia) 11/05/2022   Physical deconditioning 11/05/2022   Abdominal pain 11/04/2022   Anisocoria 11/04/2022   COVID-19 05/12/2021   Frequent falls 05/05/2021   Cognitive impairment 05/05/2021   Protein-calorie malnutrition, severe 03/09/2021   Palliative care encounter    Trigeminal neuralgia of right side of face 03/07/2021   Intractable headache 03/06/2021   HTN (hypertension) 03/06/2021   COPD (chronic obstructive pulmonary disease) ([place]) 03/06/2021   Diabetes mellitus without complication ([place]) 05/69/7948   Stroke ([place]) 03/06/2021   Xerostomia 12/23/2020   Constipation 12/03/2020   Weight loss 12/03/2020   Malnutrition of moderate degree 10/26/2020   Painful swallowing 10/25/2020   Inadequate oral intake 10/25/2020   Mucositis due to antineoplastic therapy 10/25/2020   Mucositis due to radiation therapy 10/25/2020   Odynophagia 10/25/2020   Dehydration 10/22/2020   Nausea without vomiting 10/22/2020   Poor appetite 10/22/2020   Goals of care, counseling/discussion 09/21/2020   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   Encounter for antineoplastic chemotherapy 09/15/2020   Hypomagnesemia 09/08/2020   Nasopharyngeal carcinoma ([place]) 08/18/2020   Severe recurrent major depression without psychotic features ([place]) 06/01/2017   Alcohol abuse 06/01/2017   Tylenol overdose 05/31/2017  Erroneous encounter - disregard 08/02/2016   Tobacco use disorder 06/28/2016   Tick bite of flank 06/16/2016   Grief counseling 06/16/2016   Generalized weakness 06/16/2016   Anxiety and depression 04/21/2015   Acid reflux 04/21/2015   Calcium blood increased 04/21/2015   Dyslipidemia 04/21/2015   HLD (hyperlipidemia) 04/21/2015   PCP:  Verita Lamb, NP Pharmacy:   [id number] [id number]: [phone]:  [phone] [id number] [id number]: [phone]: [phone]   Social Determinants of Health (SDOH) Interventions    Readmission Risk Interventions    10/27/2020    1:28 PM  Readmission Risk Prevention Plan  Transportation Screening Complete  PCP or Specialist Appt within 3-5 Days Complete  HRI or [place]  Social Work Consult for ReGreenlanning/Counseling Complete  Palliative Care Screening Not Applicable  Medication Review (RPress photographerComplete

## 2022-11-07 DIAGNOSIS — F17200 Nicotine dependence, unspecified, uncomplicated: Secondary | ICD-10-CM | POA: Diagnosis not present

## 2022-11-07 DIAGNOSIS — E43 Unspecified severe protein-calorie malnutrition: Secondary | ICD-10-CM | POA: Diagnosis not present

## 2022-11-07 DIAGNOSIS — R531 Weakness: Secondary | ICD-10-CM | POA: Diagnosis not present

## 2022-11-07 DIAGNOSIS — I739 Peripheral vascular disease, unspecified: Secondary | ICD-10-CM | POA: Diagnosis not present

## 2022-11-07 LAB — HEMOGLOBIN A1C
Hgb A1c MFr Bld: 6 % — ABNORMAL HIGH (ref 4.8–5.6)
Hgb A1c MFr Bld: 6 % — ABNORMAL HIGH (ref 4.8–5.6)
Mean Plasma Glucose: 126 mg/dL
Mean Plasma Glucose: 126 mg/dL

## 2022-11-07 MED ORDER — ADULT MULTIVITAMIN W/MINERALS CH: 1.0000 | ORAL_TABLET | Freq: Every day | ORAL | Status: DC

## 2022-11-07 MED ORDER — ENSURE ENLIVE PO LIQD: 237.0000 mL | Freq: Three times a day (TID) | ORAL | 12 refills | Status: DC

## 2022-11-07 MED ORDER — PREDNISONE 10 MG PO TABS: ORAL_TABLET | ORAL | 0 refills | Status: AC

## 2022-11-07 MED ORDER — ENSURE ENLIVE PO LIQD
237.0000 mL | Freq: Three times a day (TID) | ORAL | Status: DC
  Administered 2022-11-07: 237 mL via ORAL

## 2022-11-07 NOTE — Progress Notes (Signed)
Initial Nutrition Assessment  DOCUMENTATION CODES:   Severe malnutrition in context of chronic illness, Underweight  INTERVENTION:   -Ensure Enlive po TID, each supplement provides 350 kcal and 20 grams of protein.  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  Pt admitted with generalized weakness, frequent falls, and COPD.   Reviewed I/O's: -150 ml x 24 hours and +1.1 L since admission  UOP: 150 ml x 24 hours  Case discussed with RN, who reports pt with poor oral intake. Pt has not eaten much this shift and RN thinks that he has little interest in eating, as she has not observed him eating candy and snacks at bedside. Pt has been drinking Ensure that he requested.   Spoke with pt at bedside, who was pleasant and in good spirits today. He reports feeling better and has drank most of a chocolate Ensure. Pt shares that he has had decreased appetite over the past month and has had little desire to eat. Per pt, he usually consumes 1-2 meals per day. His biggest meal is breakfast (eggs, sausage, and pancakes). Per pt, he ate some eggs and sausage this morning, but did not eat his pancakes as he was not given syrup. Per  pt, he has become more weak and unable to cook for himself and hired a 24 hour caregiver to help care for him. Pt unable to provide more of a diet recall other than he likes to eat fried spam. Pt with multiple missing teeth, but denied need for mechanically altered diet. He drinks Ensure at home, usually 1-2 cartons per day.   Per pt, he estimates he has lost about 8-10 pounds over the past month. He is unsure of UBW. Reviewed wt hx; 13.8% wt loss over the past 6 months, which is significant for time frame.   Discussed importance of good meal and supplement intake to promote healing. Pt amenable to supplements and liberalized diet. Also ordered pt's dinner meal.   Medications reviewed and include prednisone and melatonin.   Lab Results  Component Value Date   HGBA1C 6.0 (H) 11/05/2022   PTA DM medications are 5 mg glipizde daily.   Labs reviewed: CBGS: 120 (inpatient orders for glycemic control are none).    NUTRITION - FOCUSED PHYSICAL EXAM:  Flowsheet Row Most Recent Value  Orbital Region Severe depletion  Upper Arm Region Severe depletion  Thoracic and Lumbar Region Severe depletion  Buccal Region Moderate depletion  Temple Region Moderate depletion  Clavicle Bone Region Severe depletion  Clavicle and Acromion Bone Region Severe depletion  Scapular Bone Region Severe depletion  Dorsal Hand Moderate depletion  Patellar Region Severe depletion  Anterior Thigh Region Severe depletion  Posterior Calf Region Severe depletion  Edema (RD Assessment) None  Hair Reviewed  Eyes Reviewed  Mouth  Reviewed  Skin Reviewed  Nails Reviewed       Diet Order:   Diet Order             Diet regular Room service appropriate? Yes; Fluid consistency: Thin  Diet effective now                   EDUCATION NEEDS:   Education needs have been addressed  Skin:  Skin Assessment: Reviewed RN Assessment  Last BM:  10/31/22  Height:   Ht Readings from Last 1 Encounters:  09/26/22 6'  (1.829 m)    Weight:   Wt Readings from Last 1 Encounters:  11/07/22 54.3 kg    Ideal Body Weight:  80.9 kg  BMI:  Body mass index is 16.24 kg/m.  Estimated Nutritional Needs:   Kcal:  2200-2400  Protein:  105-120 grams  Fluid:  > 2 L    Loistine Chance, RD, LDN, [place] Registered Dietitian II Certified Diabetes Care and Education Specialist Please refer to [facility] for RD and/or RD on-call/weekend/after hours pager

## 2022-11-07 NOTE — TOC Progression Note (Signed)
Transition of Care [facility]) - Progression Note    Patient Details  Name: Kenneth Franklin MRN: [id number] Date of Birth: Mar 08, 1946  [facility] CM/SW [place], [place] Phone Number: 11/07/2022, 4:03 PM  Clinical Narrative:     TOC spoke to Kenneth Franklin [phone] and this patient is current with [facility] for [place] and [place]. TOC reached out to [facility] about adding on a HHSW. Kenneth Franklin can provide a HHSW.  The patient previously had an in home caregiver help with meals and other caregiver services for $800 a week. TOC provided Kenneth Franklin with a list of three caregiver agencies around [place], [place].  Expected Discharge Plan: [place] Barriers to Discharge: Continued Medical Work up  Expected Discharge Plan and Services Expected Discharge Plan: [place] In-house Referral: Clinical Social Work     Living arrangements for the past 2 months: [place]: PT, OT [place] Agency: Home Date [place]: 11/06/22 Time [place]: 1212 Representative spoke with at [place]: [place] ([place]) Interventions    Readmission Risk Interventions    10/27/2020    1:28 PM  Readmission Risk Prevention Plan  Transportation Screening Complete  PCP or Specialist Appt within 3-5 Days Complete  HRI or [place] Complete  Social Work Consult for [place] Planning/Counseling Complete  Palliative Care Screening Not Applicable  Medication Review Press photographer) Complete

## 2022-11-07 NOTE — Discharge Summary (Signed)
Physician Discharge Summary   Patient: Kenneth Franklin MRN: [id number] DOB: November 13, 1946  Admit date:     11/04/2022  Discharge date: 11/07/22  Discharge Physician: Annita Brod   PCP: Verita Lamb, NP   Recommendations at discharge:   Patient going home with home health PT and OT and social worker New medication: Steroid taper  Discharge Diagnoses: Principal Problem:   Generalized weakness Active Problems:   Frequent falls   COPD (chronic obstructive pulmonary disease) ([place])   Nasopharyngeal carcinoma (HCC)   HTN (hypertension)   Diabetes mellitus without complication ([place])   Anxiety and depression   Alcohol abuse   Dyslipidemia   Acid reflux   Tobacco use disorder   Anisocoria   Protein-calorie malnutrition, severe   PVD (peripheral vascular disease) ([place])   Physical deconditioning  Resolved Problems:   Abdominal pain  Hospital Course: 76 year old male with past medical history of nasopharyngeal carcinoma diagnosed 2 years ago not on any treatment, as well as chronic mesenteric ischemia PVD, diabetes mellitus, hypertension and depression who presented to the emergency room on 12/8 with complaints of shortness of breath and weakness.  On admission, patient found to be very briefly hypoxic requiring 3 L initiall, but able to be changed over to room air.  Workup revealed possible pneumonia.  Patient admitted to the hospitalist service.  Assessment and Plan: * Generalized weakness Attributed to deconditioning and electrolyte issues.  And overall deconditioning.  May end up needing skilled nursing.  PT OT recommending home health.  TSH within normal limits.  Suspect underlying vitamin D deficiency possibly hypogonadism and circulation issues.  Frequent falls Suspect combination of neuropathy as well as circulation patient has decreased pulses bilaterally and chart review shows absent pedal pulses however I was able to feel swollen in both feet.  Home health PT.  COPD  (chronic obstructive pulmonary disease) ([place]) Will try some low-dose steroids and scheduled plus as needed nebulizers.  Although the reports of some initial mild hypoxia, this quickly resolved.  No evidence of acute infection given normal procalcitonin.  Breathing slowly improving.  Discharging on quick steroid taper  Nasopharyngeal carcinoma ([place]) According to patient, was successfully treated with radiation and chemotherapy several years ago.  PET scan in June seems to confirm this.  No evidence of recurrence or metastases at this time.  Abdominal pain-resolved as of 11/06/2022 Pt states he has left sided abd pain that is chronic, which I suspect is chronic mesenteric ischemia.  CT angio of the abdomen and aorta runoff in 2017 showing no focal area of stenosis.  Patient denies any pain currently.   HTN (hypertension) Vitals:   11/04/22 1525 11/04/22 1927 11/04/22 2000 11/04/22 2030  BP: 114/82 (!) 149/78 135/80 (!) 143/75  Losartan and benztropine  Diabetes mellitus without complication (HCC) W0J pending.  Continue sliding scale.  Alcohol abuse Denies current alcohol use   Anxiety and depression Continue home medications  Dyslipidemia Continue with atorvastatin 80 mg.   Tobacco use disorder Nicotine patch.  Avoid excessive o2.    Acid reflux IV PPI therapy.   Anisocoria Both eyes left pupil dilated to light , no nystagmus Head CT negative for any acute bleed. .    Protein-calorie malnutrition, severe Nutrition Status: Nutrition Problem: Severe Malnutrition Etiology: chronic illness (COPD) Signs/Symptoms: moderate fat depletion, severe fat depletion, moderate muscle depletion, severe muscle depletion, percent weight loss Interventions: Ensure Enlive (each supplement provides 350kcal and 20 grams of protein), MVI, Liberalize Diet  Appreciate nutrition consult.  Discharging on  regular diet with multivitamin and Ensure shakes 3 times daily between meals          Consultants: Nutrition Procedures performed: None Disposition: Home with home health Diet recommendation:  Discharge Diet Orders (From admission, onward)     Start     Ordered   11/07/22 0000  Diet general        11/07/22 1609           Regular diet with Ensure supplement shakes 3 times daily between meals DISCHARGE MEDICATION: Allergies as of 11/07/2022       Reactions   Propoxyphene    Unknown reaction        Medication List     TAKE these medications    acetaminophen 325 MG tablet Commonly known as: TYLENOL Take 975 mg by mouth every 6 (six) hours as needed.   albuterol (2.5 MG/3ML) 0.083% nebulizer solution Commonly known as: PROVENTIL   Albuterol Sulfate (sensor) 108 (90 Base) MCG/ACT Aepb Inhale 1-2 puffs into the lungs every 6 (six) hours as needed (Wheezing or Shortness of breath).   aspirin EC 81 MG tablet Take 81 mg by mouth daily. Swallow whole.   clopidogrel 75 MG tablet Commonly known as: PLAVIX Take 75 mg by mouth every morning.   DULoxetine 30 MG capsule Commonly known as: CYMBALTA Take 30 mg by mouth daily.   escitalopram 20 MG tablet Commonly known as: LEXAPRO Take 20 mg by mouth every morning.   famotidine 40 MG tablet Commonly known as: PEPCID Take 40 mg by mouth daily.   feeding supplement Liqd Take 237 mLs by mouth 3 (three) times daily between meals.   fluticasone 50 MCG/ACT nasal spray Commonly known as: FLONASE Place 2 sprays into the nose daily.   folic acid 1 MG tablet Commonly known as: FOLVITE Take 1 mg by mouth daily.   glipiZIDE 5 MG tablet Commonly known as: GLUCOTROL Take 1 tablet by mouth daily.   losartan 100 MG tablet Commonly known as: COZAAR Take 100 mg by mouth daily.   multivitamin with minerals Tabs tablet Take 1 tablet by mouth daily. Start taking on: November 08, 2022   ondansetron 8 MG disintegrating tablet Commonly known as: ZOFRAN-ODT Take 8 mg by mouth every 8 (eight) hours as  needed.   oxycodone 5 MG capsule Commonly known as: OXY-IR Take 5 mg by mouth 2 (two) times daily as needed.   pantoprazole 40 MG tablet Commonly known as: PROTONIX Take 40 mg by mouth daily.   polyethylene glycol powder 17 GM/SCOOP powder Commonly known as: GLYCOLAX/MIRALAX Take by mouth.   polyvinyl alcohol 1.4 % ophthalmic solution Commonly known as: LIQUIFILM TEARS Apply to eye.   predniSONE 10 MG tablet Commonly known as: DELTASONE Take 3 tablets (30 mg total) by mouth daily with breakfast for 1 day, THEN 2 tablets (20 mg total) daily with breakfast for 1 day, THEN 1 tablet (10 mg total) daily with breakfast for 1 day. Start taking on: November 08, 2022   QC Vitamin D3 50 MCG (2000 UT) Caps Generic drug: Cholecalciferol Take 2,000 Units by mouth daily.   sodium chloride 0.65 % nasal spray Commonly known as: OCEAN Place into the nose.        Discharge Exam: Filed Weights   11/06/22 0500 11/07/22 0500  Weight: 55.9 kg 54.3 kg   General: Alert and oriented x 3, no acute distress Lungs: Decreased breath sounds throughout  Condition at discharge: fair  The results of significant diagnostics from this  hospitalization (including imaging, microbiology, ancillary and laboratory) are listed below for reference.   Imaging Studies: MR CERVICAL SPINE WO CONTRAST  Result Date: 11/05/2022 CLINICAL DATA:  History of nasopharyngeal carcinoma. Progressive onset of weakness. Left-sided neck pain. EXAM: MRI CERVICAL SPINE WITHOUT CONTRAST TECHNIQUE: Multiplanar, multisequence MR imaging of the cervical spine was performed. No intravenous contrast was administered. COMPARISON:  CT 05/28/2021 FINDINGS: Alignment: No malalignment. Vertebrae: No focal bone lesion in the cervical spine. Cord: No cord compression or abnormal cord signal. Posterior Fossa, vertebral arteries, paraspinal tissues: As noted on the brain exam, there is an abnormal appearance of the bony clivus presumed  secondary to previously treated tumor which showed direct invasion. Bulky posterior nasopharyngeal mass previously seen has been successfully treated and is no longer present Disc levels: The foramen magnum is widely patent. There is ordinary mild osteoarthritis of the C1-2 articulation but no encroachment upon the neural structures. C2-3: Mild facet osteoarthritis on the left.  No stenosis. C3-4: Mild bulging of the disc. Mild uncovertebral hypertrophy. Bilateral bony foraminal narrowing that could affect either C4 nerve. C4-5: Endplate osteophytes and bulging of the disc. Narrowing of the ventral subarachnoid space but no compression of cord. Bilateral foraminal narrowing that could affect either C5 nerve. C5-6: Bulging of the disc and uncovertebral hypertrophy more prominent on the left. No compression of cord. Bilateral foraminal narrowing that could affect either C6 nerve. C6-7: Endplate osteophytes and bulging of the disc with bilateral uncovertebral prominence. No central canal stenosis. Bilateral foraminal stenosis could affect either C7 nerve. C7-T1: Mild uncovertebral prominence. No compressive canal or foraminal narrowing. IMPRESSION: 1. No evidence of metastatic disease in the cervical region. 2. Degenerative spondylosis from C3-4 through C6-7. No compressive canal stenosis. Bilateral foraminal stenosis that could cause neural compression from C3-4 through C6-7. 3. As noted on the brain exam, there is an abnormal appearance of the bony clivus presumed secondary to previously treated tumor which previously showed direct invasion. Bulky posterior nasopharyngeal mass previously seen has been successfully treated and is no longer present. Electronically Signed   By: Nelson Chimes M.D.   On: 11/05/2022 17:51   MR BRAIN WO CONTRAST  Result Date: 11/05/2022 CLINICAL DATA:  Left-sided headache. History of nasopharyngeal carcinoma. EXAM: MRI HEAD WITHOUT CONTRAST TECHNIQUE: Multiplanar, multiecho pulse  sequences of the brain and surrounding structures were obtained without intravenous contrast. COMPARISON:  Head CT yesterday.  MRI 03/11/2021 and 03/06/2021. FINDINGS: Brain: Diffusion imaging does not show any acute or subacute infarction or other cause of restricted diffusion. There is generalized atrophy. Extensive chronic small-vessel ischemic changes affect the pons. No focal cerebellar insult. Cerebral hemispheres show patchy and confluent chronic small vessel ischemic changes throughout deep and subcortical white matter. Few old small vessel infarctions affect the basal ganglia. No large vessel territory cortical infarction. No evidence of mass, hemorrhage, hydrocephalus or extra-axial collection. Vascular: Major vessels at the base of the brain show flow. Skull and upper cervical spine: Calvarium is unremarkable. See below regarding skull base. Sinuses/Orbits: Complete opacification of the right maxillary sinus. Other sinuses are clear. Orbits are negative. Other: Previously seen bulky posterior nasopharyngeal mass has apparently been successfully treated. There is no residual soft tissue mass in that region. There continues to be abnormal signal within the clivus which previously showed direct invasion. This may be the chronic post treatment appearance of this bone presently. I cannot exclude the possibility of residual active tumor in the clivus, but that is not favored given the other response. IMPRESSION: 1.  No acute brain finding. Extensive chronic small-vessel ischemic changes throughout the brain as outlined above. No finding to suggest metastatic disease. 2. Previously seen bulky posterior nasopharyngeal mass has apparently been successfully treated. There is no residual soft tissue mass in that region. There continues to be abnormal signal within the clivus which previously showed direct invasion by tumor. This may be the chronic post treatment appearance of this bone presently. I cannot exclude the  possibility of residual active tumor, but that is not favored given the other response. 3. Complete opacification of the right maxillary sinus. Electronically Signed   By: Nelson Chimes M.D.   On: 11/05/2022 17:47   CT Head Wo Contrast  Result Date: 11/04/2022 CLINICAL DATA:  Weakness history of cancer EXAM: CT HEAD WITHOUT CONTRAST TECHNIQUE: Contiguous axial images were obtained from the base of the skull through the vertex without intravenous contrast. RADIATION DOSE REDUCTION: This exam was performed according to the departmental dose-optimization program which includes automated exposure control, adjustment of the mA and/or kV according to patient size and/or use of iterative reconstruction technique. COMPARISON:  CT 11/02/2022, 05/28/2021, MRI 08/13/2020 FINDINGS: Brain: No acute territorial infarction, hemorrhage or intracranial mass. Atrophy and advanced chronic small vessel ischemic changes of the white matter. Stable ventricle size. Vascular: No hyperdense vessels.  Carotid vascular calcification Skull: No fracture. Heterogeneous erosive changes involving the clivus and central skull base as before. Sinuses/Orbits: Mild mucosal thickening in the ethmoid sinus. Opacified right maxillary sinus Other: None IMPRESSION: 1. No CT evidence for acute intracranial abnormality. 2. Atrophy and chronic small vessel ischemic changes of the white matter. Similar heterogenous erosive changes at the clivus and central skull base corresponding to sequela of known nasopharyngeal cancer. Electronically Signed   By: Donavan Foil M.D.   On: 11/04/2022 20:24   CT Angio Chest PE W and/or Wo Contrast  Result Date: 11/04/2022 CLINICAL DATA:  Nasopharyngeal carcinoma, prior radiation therapy and chemotherapy. Shortness of breath and hypoxia. Weakness. * Tracking Code: BO * EXAM: CT ANGIOGRAPHY CHEST WITH CONTRAST TECHNIQUE: Multidetector CT imaging of the chest was performed using the standard protocol during bolus  administration of intravenous contrast. Multiplanar CT image reconstructions and MIPs were obtained to evaluate the vascular anatomy. RADIATION DOSE REDUCTION: This exam was performed according to the departmental dose-optimization program which includes automated exposure control, adjustment of the mA and/or kV according to patient size and/or use of iterative reconstruction technique. CONTRAST:  44m OMNIPAQUE IOHEXOL 350 MG/ML SOLN COMPARISON:  PET-CT 05/10/2022 FINDINGS: Cardiovascular: No filling defect is identified in the pulmonary arterial tree to suggest pulmonary embolus. Coronary, aortic arch, and branch vessel atherosclerotic vascular disease. Mediastinum/Nodes: Calcified lymph nodes from old granulomatous disease. Borderline prominent 1.0 cm right hilar lymph node on image 80 series 4, nonspecific. Lungs/Pleura: Centrilobular emphysema. Subsegmental atelectasis in the posterior basal segment right lower lobe. Mild peripheral airspace opacity with associated marginal reticulation in both lower lobes, query aspiration pneumonitis or mild bilateral pneumonia. Upper Abdomen: Punctate calcifications in the liver and spleen compatible with old granulomatous disease. Asymmetric right renal atrophy. Substantial atheromatous plaque at the origins of the celiac trunk and SMA, patency of the structures is not established, degree of stenosis not established. Musculoskeletal: Old healed right lateral rib fractures. Review of the MIP images confirms the above findings. IMPRESSION: 1. No filling defect is identified in the pulmonary arterial tree to suggest pulmonary embolus. 2. Mild peripheral airspace opacity with associated marginal reticulation in both lower lobes, query aspiration pneumonitis or mild bilateral  pneumonia. 3. Borderline prominent 1.0 cm right hilar lymph node, nonspecific. 4. Old granulomatous disease. 5. Asymmetric right renal atrophy. 6. Substantial atheromatous plaque at the origins of the  celiac trunk and SMA, patency of the structures is not established. 7. Aortic atherosclerosis. 8. Emphysema. Aortic Atherosclerosis (ICD10-I70.0) and Emphysema (ICD10-J43.9). Electronically Signed   By: Van Clines M.D.   On: 11/04/2022 18:28   DG Chest Portable 1 View  Result Date: 11/04/2022 CLINICAL DATA:  Weakness. EXAM: PORTABLE CHEST 1 VIEW COMPARISON:  Chest radiograph 10/24/2020 FINDINGS: Interval removal right chest wall Port-A-Cath. Stable cardiac and mediastinal contours. Aortic atherosclerosis. No large area pulmonary consolidation. No pleural effusion or pneumothorax. Thoracic spine degenerative changes. IMPRESSION: No acute cardiopulmonary process. Electronically Signed   By: Lovey Newcomer M.D.   On: 11/04/2022 16:16   CT HEAD W & WO CONTRAST (5MM)  Result Date: 11/03/2022 CLINICAL DATA:  Loss of weight.  Nausea without vomiting. EXAM: CT HEAD WITHOUT AND WITH CONTRAST TECHNIQUE: Contiguous axial images were obtained from the base of the skull through the vertex without and with intravenous contrast. RADIATION DOSE REDUCTION: This exam was performed according to the departmental dose-optimization program which includes automated exposure control, adjustment of the mA and/or kV according to patient size and/or use of iterative reconstruction technique. CONTRAST:  37m OMNIPAQUE IOHEXOL 300 MG/ML  SOLN COMPARISON:  05/28/2021 FINDINGS: Brain: Generalized atrophy. No focal abnormality seen affecting the brainstem or cerebellum. Cerebral hemispheres show advanced chronic small-vessel ischemic changes of the white matter, perhaps slightly advanced since the prior exam. No cortical or large vessel territory infarction. No mass lesion, hemorrhage, hydrocephalus or extra-axial collection. After contrast administration, no abnormal enhancement occurs. Vascular: There is atherosclerotic calcification of the major vessels at the base of the brain. Skull: Negative Sinuses/Orbits: Opacification of the  right maxillary sinus. Other visualized sinuses are clear. Orbits negative. Other: None IMPRESSION: 1. No acute or reversible finding. Generalized atrophy. Advanced chronic small-vessel ischemic changes of the white matter, perhaps slightly advanced since the study of 05/28/2021. 2. Opacification of the right maxillary sinus. Electronically Signed   By: MNelson ChimesM.D.   On: 11/03/2022 08:32   NM GASTRIC EMPTYING  Result Date: 10/14/2022 CLINICAL DATA:  Nausea and vomiting.  Unintentional weight loss. EXAM: NUCLEAR MEDICINE GASTRIC EMPTYING SCAN TECHNIQUE: After oral ingestion of radiolabeled meal, sequential abdominal images were obtained for 4 hours. Percentage of activity emptying the stomach was calculated at 1 hour, 2 hour, 3 hour, and 4 hours. RADIOPHARMACEUTICALS:  2.2 mCi Tc-960mulfur colloid in standardized meal COMPARISON:  None Available. FINDINGS: Expected location of the stomach in the left upper quadrant. Ingested meal empties the stomach gradually over the course of the study. 57% emptied at 1 hr ( normal >= 10%) 57% emptied at 2 hr ( normal >= 40%) 82% emptied at 3 hr ( normal >= 70%) 86% emptied at 4 hr ( normal >= 90%) IMPRESSION: Normal gastric emptying up to 3 hours, with slightly delayed gastric emptying at 4 hours. This slight delay is of uncertain clinical significance. Electronically Signed   By: JoMarlaine Hind.D.   On: 10/14/2022 16:17    Microbiology: Results for orders placed or performed during the hospital encounter of 11/04/22  Resp Panel by RT-PCR (Flu A&B, Covid) Anterior Nasal Swab     Status: None   Collection Time: 11/04/22  4:16 PM   Specimen: Anterior Nasal Swab  Result Value Ref Range Status   SARS Coronavirus 2 by RT PCR NEGATIVE  NEGATIVE Final    Comment: (NOTE) SARS-CoV-2 target nucleic acids are NOT DETECTED.  The SARS-CoV-2 RNA is generally detectable in upper respiratory specimens during the acute phase of infection. The lowest concentration of  SARS-CoV-2 viral copies this assay can detect is 138 copies/mL. A negative result does not preclude SARS-Cov-2 infection and should not be used as the sole basis for treatment or other patient management decisions. A negative result may occur with  improper specimen collection/handling, submission of specimen other than nasopharyngeal swab, presence of viral mutation(s) within the areas targeted by this assay, and inadequate number of viral copies(<138 copies/mL). A negative result must be combined with clinical observations, patient history, and epidemiological information. The expected result is Negative.  Fact Sheet for Patients:  EntrepreneurPulse.com.au  Fact Sheet for Healthcare Providers:  IncredibleEmployment.be  This test is no t yet approved or cleared by the [place] FDA and  has been authorized for detection and/or diagnosis of SARS-CoV-2 by FDA under an Emergency Use Authorization (EUA). This EUA will remain  in effect (meaning this test can be used) for the duration of the COVID-19 declaration under Section 564(b)(1) of the Act, 21 U.S.C.section 360bbb-3(b)(1), unless the authorization is terminated  or revoked sooner.       Influenza A by PCR NEGATIVE NEGATIVE Final   Influenza B by PCR NEGATIVE NEGATIVE Final    Comment: (NOTE) The Xpert Xpress SARS-CoV-2/FLU/RSV plus assay is intended as an aid in the diagnosis of influenza from Nasopharyngeal swab specimens and should not be used as a sole basis for treatment. Nasal washings and aspirates are unacceptable for Xpert Xpress SARS-CoV-2/FLU/RSV testing.  Fact Sheet for Patients: EntrepreneurPulse.com.au  Fact Sheet for Healthcare Providers: IncredibleEmployment.be  This test is not yet approved or cleared by the [place] FDA and has been authorized for detection and/or diagnosis of SARS-CoV-2 by FDA under an Emergency Use  Authorization (EUA). This EUA will remain in effect (meaning this test can be used) for the duration of the COVID-19 declaration under Section 564(b)(1) of the Act, 21 U.S.C. section 360bbb-3(b)(1), unless the authorization is terminated or revoked.  Performed at [facility], [address] [place], [place] [address]   Blood culture (routine x 2)     Status: None (Preliminary result)   Collection Time: 11/04/22  7:06 PM   Specimen: BLOOD  Result Value Ref Range Status   Specimen Description BLOOD BLOOD RIGHT ARM  Final   Special Requests   Final    BOTTLES DRAWN AEROBIC AND ANAEROBIC Blood Culture results may not be optimal due to an excessive volume of blood received in culture bottles   Culture   Final    NO GROWTH 3 DAYS Performed at [facility], [address] [place], [place] [address]    Report Status PENDING  Incomplete  Blood culture (routine x 2)     Status: None (Preliminary result)   Collection Time: 11/04/22  7:06 PM   Specimen: BLOOD  Result Value Ref Range Status   Specimen Description BLOOD BLOOD LEFT ARM  Final   Special Requests   Final    BOTTLES DRAWN AEROBIC AND ANAEROBIC Blood Culture results may not be optimal due to an excessive volume of blood received in culture bottles   Culture   Final    NO GROWTH 3 DAYS Performed at [name], [address], [place], [place] [address]    Report Status PENDING  Incomplete    Labs: CBC: Recent Labs  Lab 11/04/22 1616 11/05/22 0300  WBC 9.1 6.5  HGB 14.0 12.4*  HCT 43.2 37.3*  MCV 88.9 86.9  PLT 387 407   Basic Metabolic Panel: Recent Labs  Lab 11/02/22 0831 11/04/22 1616 11/05/22 0300 11/06/22 0613  NA  --  134* 135 136  K  --  3.6 3.3* 4.2  CL  --  97* 103 104  CO2  --  '27 24 26  '$ GLUCOSE  --  142* 112* 113*  BUN  --  9 7* 9  CREATININE 0.70 0.75 0.62 0.63  CALCIUM  --  9.1 8.4* 8.9  MG  --   --   --  1.9   Liver Function Tests: Recent Labs  Lab  11/04/22 1616 11/05/22 0300  AST 23 16  ALT 15 12  ALKPHOS 93 77  BILITOT 0.8 0.7  PROT 7.1 6.2*  ALBUMIN 3.1* 2.7*   CBG: No results for input(s): "GLUCAP" in the last 168 hours.  Discharge time spent: less than 30 minutes.  Signed: Annita Brod, MD [facility] 11/07/2022

## 2022-11-07 NOTE — Progress Notes (Signed)
Patient discharged to home. Peripheral Ivs removed. All belongings sent back with patient. Discharge instructions provided and all questions answered.

## 2022-11-07 NOTE — TOC Transition Note (Signed)
Transition of Care [facility]) - CM/SW Discharge Note   Patient Details  Name: Kenneth Franklin MRN: [id number] Date of Birth: December 09, 1945  Transition of Care [facility]) CM/SW Contact:  Colen Darling, [place] Phone Number: 11/07/2022, 4:58 PM   Clinical Narrative:     Donella Stade called Ernestina Patches regarding caregiver services and Alvis Lemmings OT/PT and SW. TOC provided Otila Kluver with three phone numbers for caregiver services around her town.  HHSW can assist with additional resources.  Final next level of care: Home w Home Health Services Barriers to Discharge: Barriers Resolved   Patient Goals and CMS Choice    [facility] and caregivers.    Discharge Placement                  Name of family member notified: Ernestina Patches Patient and family notified of of transfer: 11/07/22  Discharge Plan and Services In-house Referral: Clinical Social Work                        HH Arranged: PT, OT, Social Work [place] Agency: [place] Date [place]: 11/06/22 Time [place]: 1212 Representative spoke with at [place]: [place] ([place]) Interventions     Readmission Risk Interventions    10/27/2020    1:28 PM  Readmission Risk Prevention Plan  Transportation Screening Complete  PCP or Specialist Appt within 3-5 Days Complete  HRI or [place] Complete  Social Work Consult for [place] Planning/Counseling Complete  Palliative Care Screening Not Applicable  Medication Review [employer]) Complete

## 2022-11-09 LAB — CULTURE, BLOOD (ROUTINE X 2)
Culture: NO GROWTH
Culture: NO GROWTH

## 2022-11-28 DIAGNOSIS — Z7984 Long term (current) use of oral hypoglycemic drugs: Secondary | ICD-10-CM | POA: Insufficient documentation

## 2022-11-28 DIAGNOSIS — E1151 Type 2 diabetes mellitus with diabetic peripheral angiopathy without gangrene: Secondary | ICD-10-CM | POA: Insufficient documentation

## 2022-11-28 DIAGNOSIS — E7800 Pure hypercholesterolemia, unspecified: Secondary | ICD-10-CM | POA: Insufficient documentation

## 2022-11-28 DIAGNOSIS — F1120 Opioid dependence, uncomplicated: Secondary | ICD-10-CM | POA: Insufficient documentation

## 2022-11-28 DIAGNOSIS — F1010 Alcohol abuse, uncomplicated: Secondary | ICD-10-CM | POA: Insufficient documentation

## 2022-11-28 DIAGNOSIS — J449 Chronic obstructive pulmonary disease, unspecified: Secondary | ICD-10-CM | POA: Insufficient documentation

## 2022-11-28 DIAGNOSIS — C111 Malignant neoplasm of posterior wall of nasopharynx: Secondary | ICD-10-CM | POA: Insufficient documentation

## 2022-11-28 DIAGNOSIS — Z7902 Long term (current) use of antithrombotics/antiplatelets: Secondary | ICD-10-CM | POA: Insufficient documentation

## 2022-11-30 ENCOUNTER — Telehealth: Payer: Self-pay

## 2022-11-30 NOTE — Telephone Encounter (Signed)
220 pm. Phone call made to daughter to schedule a home visit with patient.  Visit scheduled for 12/16/22 @ 1230.

## 2022-12-15 ENCOUNTER — Telehealth: Payer: Self-pay

## 2022-12-15 NOTE — Telephone Encounter (Signed)
1010 am.  Return call made to daughter Otila Kluver.  Tomorrow's appointment needs to be rescheduled to next week.  Patient had to reschedule PCP appointment to tomorrow.  Visit with Palliative Care has been re-scheduled to next Friday.

## 2022-12-16 ENCOUNTER — Other Ambulatory Visit: Payer: Medicare Other

## 2022-12-16 ENCOUNTER — Other Ambulatory Visit: Payer: Self-pay

## 2022-12-21 ENCOUNTER — Encounter: Payer: Self-pay | Admitting: Internal Medicine

## 2022-12-21 ENCOUNTER — Inpatient Hospital Stay: Payer: Medicare Other | Attending: Internal Medicine | Admitting: Internal Medicine

## 2022-12-21 ENCOUNTER — Inpatient Hospital Stay: Payer: Medicare Other

## 2022-12-21 DIAGNOSIS — Z79899 Other long term (current) drug therapy: Secondary | ICD-10-CM | POA: Diagnosis not present

## 2022-12-21 DIAGNOSIS — C119 Malignant neoplasm of nasopharynx, unspecified: Secondary | ICD-10-CM

## 2022-12-21 DIAGNOSIS — K117 Disturbances of salivary secretion: Secondary | ICD-10-CM | POA: Diagnosis not present

## 2022-12-21 DIAGNOSIS — Z923 Personal history of irradiation: Secondary | ICD-10-CM | POA: Diagnosis not present

## 2022-12-21 LAB — CBC WITH DIFFERENTIAL/PLATELET
Abs Immature Granulocytes: 0.03 10*3/uL (ref 0.00–0.07)
Basophils Absolute: 0 10*3/uL (ref 0.0–0.1)
Basophils Relative: 0 %
Eosinophils Absolute: 0 10*3/uL (ref 0.0–0.5)
Eosinophils Relative: 0 %
HCT: 36.8 % — ABNORMAL LOW (ref 39.0–52.0)
Hemoglobin: 11.4 g/dL — ABNORMAL LOW (ref 13.0–17.0)
Immature Granulocytes: 1 %
Lymphocytes Relative: 13 %
Lymphs Abs: 0.7 10*3/uL (ref 0.7–4.0)
MCH: 29.5 pg (ref 26.0–34.0)
MCHC: 31 g/dL (ref 30.0–36.0)
MCV: 95.1 fL (ref 80.0–100.0)
Monocytes Absolute: 0.6 10*3/uL (ref 0.1–1.0)
Monocytes Relative: 10 %
Neutro Abs: 4 10*3/uL (ref 1.7–7.7)
Neutrophils Relative %: 76 %
Platelets: 327 10*3/uL (ref 150–400)
RBC: 3.87 MIL/uL — ABNORMAL LOW (ref 4.22–5.81)
RDW: 14.6 % (ref 11.5–15.5)
WBC: 5.3 10*3/uL (ref 4.0–10.5)
nRBC: 0 % (ref 0.0–0.2)

## 2022-12-21 LAB — COMPREHENSIVE METABOLIC PANEL
ALT: 11 U/L (ref 0–44)
AST: 17 U/L (ref 15–41)
Albumin: 3.7 g/dL (ref 3.5–5.0)
Alkaline Phosphatase: 104 U/L (ref 38–126)
Anion gap: 12 (ref 5–15)
BUN: 11 mg/dL (ref 8–23)
CO2: 26 mmol/L (ref 22–32)
Calcium: 8.8 mg/dL — ABNORMAL LOW (ref 8.9–10.3)
Chloride: 98 mmol/L (ref 98–111)
Creatinine, Ser: 0.79 mg/dL (ref 0.61–1.24)
GFR, Estimated: >60 mL/min (ref >60)
Glucose, Bld: 173 mg/dL — ABNORMAL HIGH (ref 70–99)
Potassium: 4.1 mmol/L (ref 3.5–5.1)
Sodium: 136 mmol/L (ref 135–145)
Total Bilirubin: 0.3 mg/dL (ref 0.3–1.2)
Total Protein: 7.3 g/dL (ref 6.5–8.1)

## 2022-12-21 NOTE — Progress Notes (Signed)
[place] OFFICE PROGRESS NOTE  Patient Care Team: Verita Lamb, NP as PCP - General Margaretha Sheffield, MD (Otolaryngology) Tamala Julian Jonette Eva, NP (Inactive) as Nurse Practitioner (Hospice and Palliative Medicine) Rica Koyanagi, MD as Referring Physician (Geriatric Medicine) Verlon Au, NP as Nurse Practitioner (Nurse Practitioner) Christean Grief, RN as Registered Nurse Cammie Sickle, MD as Consulting Physician (Oncology)  SUMMARY OF ONCOLOGIC HISTORY: Oncology History  Nasopharyngeal carcinoma [facility])  08/18/2020 Initial Diagnosis   Nasopharyngeal carcinoma ([place])   08/18/2020 Cancer Staging   Staging form: Pharynx - Nasopharynx, AJCC 8th Edition - Clinical stage from 08/18/2020: Stage III (cT3, cN0, cM0) - Signed by Lequita Asal, MD on 02/17/2021 Histopathologic type: Squamous cell carcinoma, keratinizing, NOS Stage prefix: Initial diagnosis ECOG performance status: Grade 1 Stage used in treatment planning: Yes National guidelines used in treatment planning: Yes Type of national guideline used in treatment planning: NCCN   08/24/2020 - 10/20/2020 Chemotherapy         12/31/2020 -  Chemotherapy    Patient is on Treatment Plan: HEAD/NECK NASOPHARYNGEAL ADJUVANT CISPLATIN D1 + 5FU IVCI D1-5 Q28D       INTERVAL HISTORY: in wheel chair; with step daughter.   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  Legs are getting weak and is be worked up by PCP. 96/59, HR 106 today  Patient was recently admitted to hospital last month for COPD exacerbation/pneumonia; and multiple falls.    He is currently at home.  He lives alone.  He has private sitters at home.  Positive weight loss.  Patient does not want to eat.  Continues to complain of dry mouth.   Review of Systems  Constitutional:  Positive for malaise/fatigue and weight loss.  Negative for chills, diaphoresis and fever.  HENT:  Negative for nosebleeds and sore throat.   Eyes:  Negative for double vision.  Respiratory:  Negative for cough, hemoptysis, sputum production, shortness of breath and wheezing.   Cardiovascular:  Negative for chest pain, palpitations, orthopnea and leg swelling.  Gastrointestinal:  Negative for abdominal pain, blood in stool, constipation, diarrhea, heartburn, melena, nausea and vomiting.  Genitourinary:  Negative for dysuria, frequency and urgency.  Musculoskeletal:  Positive for back pain and joint pain.  Skin: Negative.  Negative for itching and rash.  Neurological:  Negative for dizziness, tingling, focal weakness, weakness and headaches.  Endo/Heme/Allergies:  Does not bruise/bleed easily.  Psychiatric/Behavioral:  Negative for depression. The patient is not nervous/anxious and does not have insomnia.      ALLERGIES:  is allergic to propoxyphene.  MEDICATIONS:  Current Outpatient Medications  Medication Sig Dispense Refill   acetaminophen (TYLENOL) 325 MG tablet Take 975 mg by mouth every 6 (six) hours as needed.     albuterol (PROVENTIL) (2.5 MG/3ML) 0.083% nebulizer solution      Albuterol Sulfate, sensor, 108 (90 Base) MCG/ACT AEPB Inhale 1-2 puffs into the lungs every 6 (six) hours as needed (Wheezing or Shortness of breath).     aspirin EC 81 MG tablet Take 81 mg by mouth daily. Swallow whole.     Cholecalciferol (QC VITAMIN D3) 50 MCG (2000 UT) CAPS Take 2,000 Units by mouth daily.     clopidogrel (PLAVIX) 75 MG tablet Take 75 mg by mouth every morning.     DULoxetine (CYMBALTA) 30 MG capsule Take 30 mg by mouth daily.     famotidine (PEPCID) 40 MG tablet  Take 40 mg by mouth daily.     folic acid (FOLVITE) 1 MG tablet Take 1 mg by mouth daily.     glipiZIDE (GLUCOTROL) 5 MG tablet Take 1 tablet by mouth daily.     linaclotide (LINZESS) 72 MCG capsule Take 72 mcg by mouth daily before breakfast.     Multiple Vitamin  (MULTIVITAMIN WITH MINERALS) TABS tablet Take 1 tablet by mouth daily.     ondansetron (ZOFRAN-ODT) 8 MG disintegrating tablet Take 8 mg by mouth every 8 (eight) hours as needed.     oxycodone (OXY-IR) 5 MG capsule Take 5 mg by mouth daily as needed.     pantoprazole (PROTONIX) 40 MG tablet Take 40 mg by mouth daily.     polyethylene glycol powder (GLYCOLAX/MIRALAX) 17 GM/SCOOP powder Take by mouth.     polyvinyl alcohol (LIQUIFILM TEARS) 1.4 % ophthalmic solution Apply to eye.     sodium chloride (OCEAN) 0.65 % nasal spray Place into the nose.     escitalopram (LEXAPRO) 20 MG tablet Take 20 mg by mouth every morning.  (Patient not taking: Reported on 12/21/2022)     feeding supplement (ENSURE ENLIVE / ENSURE PLUS) LIQD Take 237 mLs by mouth 3 (three) times daily between meals. (Patient not taking: Reported on 12/21/2022) 237 mL 12   fluticasone (FLONASE) 50 MCG/ACT nasal spray Place 2 sprays into the nose daily. (Patient not taking: Reported on 12/21/2022)     losartan (COZAAR) 100 MG tablet Take 100 mg by mouth daily. (Patient not taking: Reported on 12/21/2022)     pilocarpine (SALAGEN) 5 MG tablet Take 5 mg by mouth 3 (three) times daily. (Patient not taking: Reported on 12/21/2022)     No current facility-administered medications for this visit.   Facility-Administered Medications Ordered in Other Visits  Medication Dose Route Frequency Provider Last Rate Last Admin   sodium chloride flush (NS) 0.9 % injection 10 mL  10 mL Intravenous PRN Nolon Stalls C, MD   10 mL at 12/03/20 1138    PHYSICAL EXAMINATION:   Vitals:   12/21/22 1400  BP: (!) 96/59  Pulse: (!) 106  Resp: 18  Temp: 98.7 F (37.1 C)   Filed Weights   12/21/22 1400  Weight: 121 lb 4.8 oz (55 kg)    Physical Exam Vitals and nursing note reviewed.  HENT:     Head: Normocephalic and atraumatic.     Mouth/Throat:     Pharynx: Oropharynx is clear.  Eyes:     Extraocular Movements: Extraocular movements intact.      Pupils: Pupils are equal, round, and reactive to light.  Cardiovascular:     Rate and Rhythm: Normal rate and regular rhythm.  Pulmonary:     Comments: Decreased breath sounds bilaterally.  Abdominal:     Palpations: Abdomen is soft.  Musculoskeletal:        General: Normal range of motion.     Cervical back: Normal range of motion.  Skin:    General: Skin is warm.  Neurological:     General: No focal deficit present.     Mental Status: He is alert and oriented to person, place, and time.  Psychiatric:        Behavior: Behavior normal.        Judgment: Judgment normal.      LABORATORY DATA:  I have reviewed the data as listed    Component Value Date/Time   NA 136 12/21/2022 1411   NA 137 12/15/2015 1056  K 4.1 12/21/2022 1411   CL 98 12/21/2022 1411   CO2 26 12/21/2022 1411   GLUCOSE 173 (H) 12/21/2022 1411   BUN 11 12/21/2022 1411   BUN 8 12/15/2015 1056   CREATININE 0.79 12/21/2022 1411   CREATININE 1.07 06/16/2016 1632   CALCIUM 8.8 (L) 12/21/2022 1411   PROT 7.3 12/21/2022 1411   PROT 6.5 12/15/2015 1056   ALBUMIN 3.7 12/21/2022 1411   ALBUMIN 4.2 12/15/2015 1056   AST 17 12/21/2022 1411   ALT 11 12/21/2022 1411   ALKPHOS 104 12/21/2022 1411   BILITOT 0.3 12/21/2022 1411   BILITOT 0.5 12/15/2015 1056   GFRNONAA >60 12/21/2022 1411   GFRNONAA 71 06/16/2016 1632   GFRAA >60 08/18/2020 1129   GFRAA 82 06/16/2016 1632    No results found for: "SPEP", "UPEP"  Lab Results  Component Value Date   WBC 5.3 12/21/2022   NEUTROABS 4.0 12/21/2022   HGB 11.4 (L) 12/21/2022   HCT 36.8 (L) 12/21/2022   MCV 95.1 12/21/2022   PLT 327 12/21/2022      Chemistry      Component Value Date/Time   NA 136 12/21/2022 1411   NA 137 12/15/2015 1056   K 4.1 12/21/2022 1411   CL 98 12/21/2022 1411   CO2 26 12/21/2022 1411   BUN 11 12/21/2022 1411   BUN 8 12/15/2015 1056   CREATININE 0.79 12/21/2022 1411   CREATININE 1.07 06/16/2016 1632      Component Value  Date/Time   CALCIUM 8.8 (L) 12/21/2022 1411   ALKPHOS 104 12/21/2022 1411   AST 17 12/21/2022 1411   ALT 11 12/21/2022 1411   BILITOT 0.3 12/21/2022 1411   BILITOT 0.5 12/15/2015 1056       No results found for: "CA125", "CEA", "PSA", "CA199", "CA2729", "AFP"   RADIOGRAPHIC STUDIES: I have personally reviewed the radiological images as listed and agreed with the findings in the report. No results found.   ASSESSMENT & PLAN:  Nasopharyngeal carcinoma ([place]) # T3NxMx/stage 3 nasopharyngeal carcinoma- unresectable. S/p 5 weeks of concurrent cisplatin and radiation [shortened x 3 fractions d/t mucositis].  Patient declined adjuvant chemotherapy.  Completed 10/24/20. 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   # JUNE 13th, 2023-PET scan marked reduced in size of soft tissue mass centered in the sphenoid sinus as well as marked reduction in metabolic activity. Small focus of moderate metabolic activity does remain along the posterior RIGHT aspect of the sphenoid sinus.[Dr.Juengel].  December 2023 -hospital admission for falls MRI Brain/cervical spine-no obvious evidence of any progressive disease.  Recommend continued clinical follow-up with ENT.  # Right ear difficulty hearing-defer to ENT.  # Xerostomia-moderate to severe cevemeline 3 times daily-declines sec to expense.   # Weight loss/weakness-defer to PCP regarding further workup/management.   # DISPOSITION:  # follow up as needed- Dr.B  Above plan of care was discussed with the patient's daughter Otila Kluver.     No orders of the defined types were placed in this encounter.  All questions were answered. The patient knows to call the clinic with any problems, questions or concerns. No barriers to learning was detected.    Cammie Sickle, MD 12/21/2022 3:10 PM

## 2022-12-21 NOTE — Progress Notes (Signed)
Legs are getting weak and is be worked up by PCP.  96/59, HR 106 today.

## 2022-12-21 NOTE — Assessment & Plan Note (Addendum)
#  T3NxMx/stage 3 nasopharyngeal carcinoma- unresectable. S/p 5 weeks of concurrent cisplatin and radiation [shortened x 3 fractions d/t mucositis].  Patient declined adjuvant chemotherapy.  Completed 10/24/20. 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   # JUNE 13th, 2023-PET scan marked reduced in size of soft tissue mass centered in the sphenoid sinus as well as marked reduction in metabolic activity. Small focus of moderate metabolic activity does remain along the posterior RIGHT aspect of the sphenoid sinus.[Dr.Juengel].  December 2023 -hospital admission for falls MRI Brain/cervical spine-no obvious evidence of any progressive disease.  Recommend continued clinical follow-up with ENT.  # Right ear difficulty hearing-defer to ENT.  # Xerostomia-moderate to severe cevemeline 3 times daily-declines sec to expense.   # Weight loss/weakness-defer to PCP regarding further workup/management.   # DISPOSITION:  # follow up as needed- Dr.B  Above plan of care was discussed with the patient's daughter Otila Kluver.

## 2022-12-23 ENCOUNTER — Other Ambulatory Visit: Payer: Medicare Other

## 2022-12-23 VITALS — BP 104/80 | HR 60 | Temp 97.6°F | Wt 121.0 lb

## 2022-12-23 DIAGNOSIS — Z515 Encounter for palliative care: Secondary | ICD-10-CM

## 2022-12-23 NOTE — Progress Notes (Signed)
PATIENT NAME: Kenneth Franklin DOB: 07/08/1946 MRN: [id number]  PRIMARY CARE PROVIDER: Verita Lamb, NP  RESPONSIBLE PARTY:  Acct ID - Guarantor Home Phone Work Phone Relationship Acct Type  [id number] MYQUAN, [place] [phone]  Self P/F     [address], [place], [place] [address]   Home visit completed with patient and private caregiver Mardene Celeste.  ACP:  Reviewed MOST form and patient confirms these are his wishes.  Remains a full code.   Patient desires to remain in his home with caregiver support.  Daughter is actively involved in patient care.   Appetite:  Poor.  Only taking in 1 meal a day usually breakfast.  Patient does have ensure in the home home but is not drinking regularly due to constipation.  Fluid intake has been relatively good per patient.   Constipation:  Discussed taking miralax to address constipation so he can consume ensure. Last bm 2 days ago.   Functional Status:  Patient is ambulatory with a rolling walker.  He reports a fall about 2 weeks ago in his kitchen.  Left elbow skin tear almost healed per patient.  Patient is able to dress and toilet himself.  He does require assistance with bathing and has a shower chair.    Respiratory: Continues to smoke cigarettes and restarted about 2 years ago.  Yesterday smoked 6.  Has a cough but not always productive.  Reports shortness of breath with minimal exertion.   Using albuterol inhaler when needed.   CODE STATUS: Full ADVANCED DIRECTIVES: Yes but not on file. MOST FORM: Yes PPS: 50%   PHYSICAL EXAM:   VITALS: Today's Vitals   12/23/22 1110  BP: 104/80  Pulse: 60  Temp: 97.6 F (36.4 C)  SpO2: 92%  Weight: 121 lb (54.9 kg)    LUNGS: decreased breath sounds CARDIAC: Cor RRR}  EXTREMITIES: - for edema SKIN: Skin color, texture, turgor normal. No rashes or lesions or healing skin tear to left elbow.   NEURO: positive for gait problems and weakness       Lorenza Burton, RN

## 2022-12-25 ENCOUNTER — Other Ambulatory Visit: Payer: Self-pay

## 2023-01-18 ENCOUNTER — Other Ambulatory Visit: Payer: Self-pay

## 2023-01-19 ENCOUNTER — Other Ambulatory Visit: Payer: Medicare Other

## 2023-01-25 ENCOUNTER — Telehealth: Payer: Self-pay

## 2023-01-25 NOTE — Telephone Encounter (Signed)
1130 am.  Message received from daughter Otila Kluver.  Visit for tomorrow needs to be rescheduled.  Return call made and message left.

## 2023-01-26 ENCOUNTER — Other Ambulatory Visit: Payer: Medicare Other

## 2023-01-26 ENCOUNTER — Other Ambulatory Visit: Payer: Self-pay

## 2023-02-02 ENCOUNTER — Emergency Department: Payer: Medicare Other

## 2023-02-02 ENCOUNTER — Emergency Department
Admission: EM | Admit: 2023-02-02 | Discharge: 2023-02-02 | Disposition: A | Discharge status: Home / Self Care | Payer: Medicare Other | Source: Home / Self Care | Attending: Emergency Medicine | Admitting: Emergency Medicine

## 2023-02-02 DIAGNOSIS — S20221A Contusion of right back wall of thorax, initial encounter: Secondary | ICD-10-CM | POA: Insufficient documentation

## 2023-02-02 DIAGNOSIS — E119 Type 2 diabetes mellitus without complications: Secondary | ICD-10-CM | POA: Insufficient documentation

## 2023-02-02 DIAGNOSIS — Z8673 Personal history of transient ischemic attack (TIA), and cerebral infarction without residual deficits: Secondary | ICD-10-CM | POA: Insufficient documentation

## 2023-02-02 DIAGNOSIS — I10 Essential (primary) hypertension: Secondary | ICD-10-CM | POA: Insufficient documentation

## 2023-02-02 DIAGNOSIS — Z1152 Encounter for screening for COVID-19: Secondary | ICD-10-CM | POA: Insufficient documentation

## 2023-02-02 DIAGNOSIS — W0110XA Fall on same level from slipping, tripping and stumbling with subsequent striking against unspecified object, initial encounter: Secondary | ICD-10-CM | POA: Insufficient documentation

## 2023-02-02 DIAGNOSIS — Y92009 Unspecified place in unspecified non-institutional (private) residence as the place of occurrence of the external cause: Secondary | ICD-10-CM | POA: Insufficient documentation

## 2023-02-02 DIAGNOSIS — I639 Cerebral infarction, unspecified: Secondary | ICD-10-CM | POA: Diagnosis not present

## 2023-02-02 DIAGNOSIS — I63231 Cerebral infarction due to unspecified occlusion or stenosis of right carotid arteries: Secondary | ICD-10-CM | POA: Diagnosis not present

## 2023-02-02 LAB — CBC WITH DIFFERENTIAL/PLATELET
Abs Immature Granulocytes: 0.03 10*3/uL (ref 0.00–0.07)
Basophils Absolute: 0.1 10*3/uL (ref 0.0–0.1)
Basophils Relative: 1 %
Eosinophils Absolute: 0.1 10*3/uL (ref 0.0–0.5)
Eosinophils Relative: 1 %
HCT: 41.7 % (ref 39.0–52.0)
Hemoglobin: 13 g/dL (ref 13.0–17.0)
Immature Granulocytes: 0 %
Lymphocytes Relative: 10 %
Lymphs Abs: 1.1 10*3/uL (ref 0.7–4.0)
MCH: 27 pg (ref 26.0–34.0)
MCHC: 31.2 g/dL (ref 30.0–36.0)
MCV: 86.5 fL (ref 80.0–100.0)
Monocytes Absolute: 0.6 10*3/uL (ref 0.1–1.0)
Monocytes Relative: 6 %
Neutro Abs: 9 10*3/uL — ABNORMAL HIGH (ref 1.7–7.7)
Neutrophils Relative %: 82 %
Platelets: 380 10*3/uL (ref 150–400)
RBC: 4.82 MIL/uL (ref 4.22–5.81)
RDW: 13.7 % (ref 11.5–15.5)
WBC: 10.8 10*3/uL — ABNORMAL HIGH (ref 4.0–10.5)
nRBC: 0 % (ref 0.0–0.2)

## 2023-02-02 LAB — URINALYSIS, W/ REFLEX TO CULTURE (INFECTION SUSPECTED)
Bacteria, UA: NONE SEEN
Bilirubin Urine: NEGATIVE
Glucose, UA: NEGATIVE mg/dL
Hgb urine dipstick: NEGATIVE
Ketones, ur: 5 mg/dL — AB
Leukocytes,Ua: NEGATIVE
Nitrite: NEGATIVE
Protein, ur: 30 mg/dL — AB
Specific Gravity, Urine: 1.019 (ref 1.005–1.030)
Squamous Epithelial / HPF: NONE SEEN /HPF (ref 0–5)
pH: 7 (ref 5.0–8.0)

## 2023-02-02 LAB — COMPREHENSIVE METABOLIC PANEL
ALT: 9 U/L (ref 0–44)
AST: 20 U/L (ref 15–41)
Albumin: 3.4 g/dL — ABNORMAL LOW (ref 3.5–5.0)
Alkaline Phosphatase: 156 U/L — ABNORMAL HIGH (ref 38–126)
Anion gap: 10 (ref 5–15)
BUN: 10 mg/dL (ref 8–23)
CO2: 26 mmol/L (ref 22–32)
Calcium: 8.8 mg/dL — ABNORMAL LOW (ref 8.9–10.3)
Chloride: 98 mmol/L (ref 98–111)
Creatinine, Ser: 0.76 mg/dL (ref 0.61–1.24)
GFR, Estimated: >60 mL/min (ref >60)
Glucose, Bld: 165 mg/dL — ABNORMAL HIGH (ref 70–99)
Potassium: 4.5 mmol/L (ref 3.5–5.1)
Sodium: 134 mmol/L — ABNORMAL LOW (ref 135–145)
Total Bilirubin: 0.8 mg/dL (ref 0.3–1.2)
Total Protein: 7 g/dL (ref 6.5–8.1)

## 2023-02-02 LAB — RESP PANEL BY RT-PCR (RSV, FLU A&B, COVID)  RVPGX2
Influenza A by PCR: NEGATIVE
Influenza B by PCR: NEGATIVE
Resp Syncytial Virus by PCR: NEGATIVE
SARS Coronavirus 2 by RT PCR: NEGATIVE

## 2023-02-02 MED ORDER — SODIUM CHLORIDE 0.9 % IV BOLUS
1000.0000 mL | Freq: Once | INTRAVENOUS | Status: AC
  Administered 2023-02-02: 1000 mL via INTRAVENOUS

## 2023-02-02 NOTE — ED Provider Notes (Signed)
[facility] Provider Note    Event Date/Time   First MD Initiated Contact with Patient 02/02/23 2102     (approximate)   History   Chief Complaint: Fall   HPI  Kenneth Franklin is a 77 y.o. male with a history of hypertension, diabetes, prior stroke, alcohol abuse who is brought to the ED by EMS due to a fall at home.  Reportedly patient was trying to sit down on the edge of her bed and missed, falling onto the ground and hitting the back of his right shoulder.  Denies any head injury or headache or loss of consciousness.  No other acute complaints.     Physical Exam   Triage Vital Signs: ED Triage Vitals  Enc Vitals Group     BP 02/02/23 2104 (!) 144/96     Pulse Rate 02/02/23 2104 98     Resp 02/02/23 2104 (!) 21     Temp 02/02/23 2104 97.6 F (36.4 C)     Temp Source 02/02/23 2104 Oral     SpO2 02/02/23 2101 96 %     Weight 02/02/23 2105 122 lb 5.7 oz (55.5 kg)     Height --      Head Circumference --      Peak Flow --      Pain Score 02/02/23 2105 10     Pain Loc --      Pain Edu? --      Excl. in [place]? --     Most recent vital signs: Vitals:   02/02/23 2101 02/02/23 2104  BP:  (!) 144/96  Pulse:  98  Resp:  (!) 21  Temp:  97.6 F (36.4 C)  SpO2: 96% 98%    General: Awake, no distress.  No signs of trauma. CV:  Good peripheral perfusion.  Regular rate and rhythm Resp:  Normal effort.  Clear to auscultation bilaterally Abd:  No distention.  Soft nontender Other:  No point tenderness in the area of the right upper back where patient describes his pain from the fall.  He localizes to the inferior edge of the right scapula.  Full range of motion in all extremities.  No spinal tenderness.   ED Results / Procedures / Treatments   Labs (all labs ordered are listed, but only abnormal results are displayed) Labs Reviewed  COMPREHENSIVE METABOLIC PANEL - Abnormal; Notable for the following components:      Result Value   Sodium 134  (*)    Glucose, Bld 165 (*)    Calcium 8.8 (*)    Albumin 3.4 (*)    Alkaline Phosphatase 156 (*)    All other components within normal limits  CBC WITH DIFFERENTIAL/PLATELET - Abnormal; Notable for the following components:   WBC 10.8 (*)    Neutro Abs 9.0 (*)    All other components within normal limits  URINALYSIS, W/ REFLEX TO CULTURE (INFECTION SUSPECTED) - Abnormal; Notable for the following components:   Color, Urine YELLOW (*)    APPearance CLEAR (*)    Ketones, ur 5 (*)    Protein, ur 30 (*)    All other components within normal limits  RESP PANEL BY RT-PCR (RSV, FLU A&B, COVID)  [id number]     EKG Interpreted by me Sinus rhythm rate of 95.  Normal axis, normal intervals.  Poor R wave progression.  Normal ST segments and T waves   RADIOLOGY Chest x-ray interpreted by me, appears normal.  Radiology report reviewed  PROCEDURES:  Procedures   MEDICATIONS ORDERED IN ED: Medications  sodium chloride 0.9 % bolus 1,000 mL (1,000 mLs Intravenous New Bag/Given 02/02/23 2121)     IMPRESSION / MDM / ASSESSMENT AND PLAN / ED COURSE  I reviewed the triage vital signs and the nursing notes.  DDx: Anemia, electrolyte abnormality, AKI, UTI, pneumonia, COVID/flu  Patient's presentation is most consistent with acute presentation with potential threat to life or bodily function.  Patient presents with a mechanical fall at home.  He is elderly with chronic health issues, so patient was given IV fluids while undertaking a workup to ensure there is no underlying cause for his mechanical fall.  Workup is reassuring.  Discussed with his family member at bedside who reports no additional concerns and is comfortable with discharge.       FINAL CLINICAL IMPRESSION(S) / ED DIAGNOSES   Final diagnoses:  Fall in home, initial encounter  Contusion of right back wall of thorax, initial encounter     Rx / DC Orders   ED Discharge Orders     None        Note:  This document  was prepared using Dragon voice recognition software and may include unintentional dictation errors.   Carrie Mew, MD 02/02/23 [phone]

## 2023-02-02 NOTE — ED Triage Notes (Signed)
BIB EMS/ mechanical fall/ pt states he missed the bed/ seen yesterday for cardiac reason/ pt is confused at baseline/ pt c/o right shoulder pain d/t fall

## 2023-02-03 ENCOUNTER — Emergency Department: Payer: Medicare Other

## 2023-02-03 ENCOUNTER — Other Ambulatory Visit: Payer: Self-pay

## 2023-02-03 ENCOUNTER — Inpatient Hospital Stay
Admission: EM | Admit: 2023-02-03 | Discharge: 2023-02-09 | DRG: 064 | Disposition: A | Discharge status: Skilled Nursing Facility | Payer: Medicare Other | Attending: Hospitalist | Admitting: Hospitalist

## 2023-02-03 DIAGNOSIS — G8194 Hemiplegia, unspecified affecting left nondominant side: Secondary | ICD-10-CM

## 2023-02-03 DIAGNOSIS — I63231 Cerebral infarction due to unspecified occlusion or stenosis of right carotid arteries: Principal | ICD-10-CM | POA: Diagnosis present

## 2023-02-03 DIAGNOSIS — E785 Hyperlipidemia, unspecified: Secondary | ICD-10-CM | POA: Diagnosis present

## 2023-02-03 DIAGNOSIS — S20221A Contusion of right back wall of thorax, initial encounter: Secondary | ICD-10-CM | POA: Diagnosis present

## 2023-02-03 DIAGNOSIS — Z7902 Long term (current) use of antithrombotics/antiplatelets: Secondary | ICD-10-CM

## 2023-02-03 DIAGNOSIS — Z85818 Personal history of malignant neoplasm of other sites of lip, oral cavity, and pharynx: Secondary | ICD-10-CM

## 2023-02-03 DIAGNOSIS — Z7982 Long term (current) use of aspirin: Secondary | ICD-10-CM

## 2023-02-03 DIAGNOSIS — Z681 Body mass index (BMI) 19 or less, adult: Secondary | ICD-10-CM

## 2023-02-03 DIAGNOSIS — E88A Wasting disease (syndrome) due to underlying condition: Secondary | ICD-10-CM | POA: Diagnosis present

## 2023-02-03 DIAGNOSIS — Z1152 Encounter for screening for COVID-19: Secondary | ICD-10-CM

## 2023-02-03 DIAGNOSIS — Z923 Personal history of irradiation: Secondary | ICD-10-CM

## 2023-02-03 DIAGNOSIS — Z85828 Personal history of other malignant neoplasm of skin: Secondary | ICD-10-CM

## 2023-02-03 DIAGNOSIS — Z8673 Personal history of transient ischemic attack (TIA), and cerebral infarction without residual deficits: Secondary | ICD-10-CM

## 2023-02-03 DIAGNOSIS — E118 Type 2 diabetes mellitus with unspecified complications: Secondary | ICD-10-CM

## 2023-02-03 DIAGNOSIS — L89153 Pressure ulcer of sacral region, stage 3: Secondary | ICD-10-CM | POA: Diagnosis present

## 2023-02-03 DIAGNOSIS — E1140 Type 2 diabetes mellitus with diabetic neuropathy, unspecified: Secondary | ICD-10-CM | POA: Diagnosis present

## 2023-02-03 DIAGNOSIS — Z79899 Other long term (current) drug therapy: Secondary | ICD-10-CM

## 2023-02-03 DIAGNOSIS — Z7984 Long term (current) use of oral hypoglycemic drugs: Secondary | ICD-10-CM

## 2023-02-03 DIAGNOSIS — L8990 Pressure ulcer of unspecified site, unspecified stage: Secondary | ICD-10-CM | POA: Insufficient documentation

## 2023-02-03 DIAGNOSIS — F17210 Nicotine dependence, cigarettes, uncomplicated: Secondary | ICD-10-CM | POA: Diagnosis present

## 2023-02-03 DIAGNOSIS — Z9221 Personal history of antineoplastic chemotherapy: Secondary | ICD-10-CM

## 2023-02-03 DIAGNOSIS — W19XXXA Unspecified fall, initial encounter: Secondary | ICD-10-CM | POA: Diagnosis present

## 2023-02-03 DIAGNOSIS — Z66 Do not resuscitate: Secondary | ICD-10-CM | POA: Diagnosis present

## 2023-02-03 DIAGNOSIS — K219 Gastro-esophageal reflux disease without esophagitis: Secondary | ICD-10-CM | POA: Insufficient documentation

## 2023-02-03 DIAGNOSIS — R29710 NIHSS score 10: Secondary | ICD-10-CM | POA: Diagnosis present

## 2023-02-03 DIAGNOSIS — I639 Cerebral infarction, unspecified: Secondary | ICD-10-CM | POA: Diagnosis present

## 2023-02-03 DIAGNOSIS — R1310 Dysphagia, unspecified: Secondary | ICD-10-CM | POA: Diagnosis present

## 2023-02-03 DIAGNOSIS — F419 Anxiety disorder, unspecified: Secondary | ICD-10-CM | POA: Diagnosis present

## 2023-02-03 DIAGNOSIS — J449 Chronic obstructive pulmonary disease, unspecified: Secondary | ICD-10-CM | POA: Diagnosis present

## 2023-02-03 DIAGNOSIS — Z833 Family history of diabetes mellitus: Secondary | ICD-10-CM

## 2023-02-03 DIAGNOSIS — Y92009 Unspecified place in unspecified non-institutional (private) residence as the place of occurrence of the external cause: Secondary | ICD-10-CM

## 2023-02-03 DIAGNOSIS — F0393 Unspecified dementia, unspecified severity, with mood disturbance: Secondary | ICD-10-CM | POA: Diagnosis present

## 2023-02-03 DIAGNOSIS — I10 Essential (primary) hypertension: Secondary | ICD-10-CM

## 2023-02-03 DIAGNOSIS — E876 Hypokalemia: Secondary | ICD-10-CM | POA: Diagnosis present

## 2023-02-03 DIAGNOSIS — F05 Delirium due to known physiological condition: Secondary | ICD-10-CM | POA: Diagnosis not present

## 2023-02-03 DIAGNOSIS — C119 Malignant neoplasm of nasopharynx, unspecified: Secondary | ICD-10-CM | POA: Diagnosis present

## 2023-02-03 DIAGNOSIS — R54 Age-related physical debility: Secondary | ICD-10-CM | POA: Diagnosis present

## 2023-02-03 DIAGNOSIS — F17290 Nicotine dependence, other tobacco product, uncomplicated: Secondary | ICD-10-CM | POA: Diagnosis present

## 2023-02-03 DIAGNOSIS — K5900 Constipation, unspecified: Secondary | ICD-10-CM | POA: Diagnosis present

## 2023-02-03 DIAGNOSIS — J690 Pneumonitis due to inhalation of food and vomit: Secondary | ICD-10-CM | POA: Diagnosis present

## 2023-02-03 DIAGNOSIS — F0394 Unspecified dementia, unspecified severity, with anxiety: Secondary | ICD-10-CM | POA: Diagnosis present

## 2023-02-03 DIAGNOSIS — F32A Depression, unspecified: Secondary | ICD-10-CM | POA: Diagnosis present

## 2023-02-03 LAB — CBC WITH DIFFERENTIAL/PLATELET
Abs Immature Granulocytes: 0.05 10*3/uL (ref 0.00–0.07)
Basophils Absolute: 0.1 10*3/uL (ref 0.0–0.1)
Basophils Relative: 1 %
Eosinophils Absolute: 0.1 10*3/uL (ref 0.0–0.5)
Eosinophils Relative: 1 %
HCT: 41.5 % (ref 39.0–52.0)
Hemoglobin: 13.1 g/dL (ref 13.0–17.0)
Immature Granulocytes: 0 %
Lymphocytes Relative: 11 %
Lymphs Abs: 1.3 10*3/uL (ref 0.7–4.0)
MCH: 27.5 pg (ref 26.0–34.0)
MCHC: 31.6 g/dL (ref 30.0–36.0)
MCV: 87.2 fL (ref 80.0–100.0)
Monocytes Absolute: 0.6 10*3/uL (ref 0.1–1.0)
Monocytes Relative: 5 %
Neutro Abs: 9.3 10*3/uL — ABNORMAL HIGH (ref 1.7–7.7)
Neutrophils Relative %: 82 %
Platelets: 415 10*3/uL — ABNORMAL HIGH (ref 150–400)
RBC: 4.76 MIL/uL (ref 4.22–5.81)
RDW: 13.7 % (ref 11.5–15.5)
WBC: 11.4 10*3/uL — ABNORMAL HIGH (ref 4.0–10.5)
nRBC: 0 % (ref 0.0–0.2)

## 2023-02-03 NOTE — ED Triage Notes (Signed)
Pt presents to ER with step daughter with c/o progressive left side limb weakness.  Pt was seen here yesterday for fall.  Step daughter states that his left side was slightly weak before today, but became more prevalent today.  Pt is alert, but daughter states pt has acted more "mean" today than normal.  Pt is alert, and noted to be extremely unsteady on feet with left side extremity weakness.  Left side is limp on assessment.    Pt c/o needing to use restroom on arrival.  Pt taken to restroom but is unable to use toilet.

## 2023-02-04 ENCOUNTER — Observation Stay: Payer: Medicare Other

## 2023-02-04 ENCOUNTER — Observation Stay (HOSPITAL_COMMUNITY)
Admit: 2023-02-04 | Discharge: 2023-02-04 | Disposition: A | Discharge status: Home / Self Care | Payer: Medicare Other | Attending: Family Medicine | Admitting: Family Medicine

## 2023-02-04 DIAGNOSIS — E785 Hyperlipidemia, unspecified: Secondary | ICD-10-CM | POA: Diagnosis present

## 2023-02-04 DIAGNOSIS — G8194 Hemiplegia, unspecified affecting left nondominant side: Secondary | ICD-10-CM | POA: Diagnosis present

## 2023-02-04 DIAGNOSIS — R531 Weakness: Secondary | ICD-10-CM

## 2023-02-04 DIAGNOSIS — K219 Gastro-esophageal reflux disease without esophagitis: Secondary | ICD-10-CM | POA: Diagnosis present

## 2023-02-04 DIAGNOSIS — Z1152 Encounter for screening for COVID-19: Secondary | ICD-10-CM | POA: Diagnosis not present

## 2023-02-04 DIAGNOSIS — F17210 Nicotine dependence, cigarettes, uncomplicated: Secondary | ICD-10-CM | POA: Diagnosis present

## 2023-02-04 DIAGNOSIS — I639 Cerebral infarction, unspecified: Secondary | ICD-10-CM | POA: Diagnosis not present

## 2023-02-04 DIAGNOSIS — Z7984 Long term (current) use of oral hypoglycemic drugs: Secondary | ICD-10-CM | POA: Diagnosis not present

## 2023-02-04 DIAGNOSIS — E88A Wasting disease (syndrome) due to underlying condition: Secondary | ICD-10-CM | POA: Diagnosis present

## 2023-02-04 DIAGNOSIS — F32A Depression, unspecified: Secondary | ICD-10-CM

## 2023-02-04 DIAGNOSIS — Z515 Encounter for palliative care: Secondary | ICD-10-CM | POA: Diagnosis not present

## 2023-02-04 DIAGNOSIS — F05 Delirium due to known physiological condition: Secondary | ICD-10-CM | POA: Diagnosis not present

## 2023-02-04 DIAGNOSIS — I10 Essential (primary) hypertension: Secondary | ICD-10-CM | POA: Diagnosis present

## 2023-02-04 DIAGNOSIS — C119 Malignant neoplasm of nasopharynx, unspecified: Secondary | ICD-10-CM | POA: Diagnosis not present

## 2023-02-04 DIAGNOSIS — J449 Chronic obstructive pulmonary disease, unspecified: Secondary | ICD-10-CM | POA: Diagnosis present

## 2023-02-04 DIAGNOSIS — F0393 Unspecified dementia, unspecified severity, with mood disturbance: Secondary | ICD-10-CM | POA: Diagnosis present

## 2023-02-04 DIAGNOSIS — Z85828 Personal history of other malignant neoplasm of skin: Secondary | ICD-10-CM | POA: Diagnosis not present

## 2023-02-04 DIAGNOSIS — L89153 Pressure ulcer of sacral region, stage 3: Secondary | ICD-10-CM | POA: Diagnosis present

## 2023-02-04 DIAGNOSIS — E876 Hypokalemia: Secondary | ICD-10-CM | POA: Diagnosis present

## 2023-02-04 DIAGNOSIS — I6389 Other cerebral infarction: Secondary | ICD-10-CM

## 2023-02-04 DIAGNOSIS — Z66 Do not resuscitate: Secondary | ICD-10-CM | POA: Diagnosis present

## 2023-02-04 DIAGNOSIS — F0394 Unspecified dementia, unspecified severity, with anxiety: Secondary | ICD-10-CM | POA: Diagnosis present

## 2023-02-04 DIAGNOSIS — F17290 Nicotine dependence, other tobacco product, uncomplicated: Secondary | ICD-10-CM | POA: Diagnosis present

## 2023-02-04 DIAGNOSIS — Y92009 Unspecified place in unspecified non-institutional (private) residence as the place of occurrence of the external cause: Secondary | ICD-10-CM | POA: Diagnosis not present

## 2023-02-04 DIAGNOSIS — I63231 Cerebral infarction due to unspecified occlusion or stenosis of right carotid arteries: Secondary | ICD-10-CM | POA: Diagnosis present

## 2023-02-04 DIAGNOSIS — W19XXXA Unspecified fall, initial encounter: Secondary | ICD-10-CM | POA: Diagnosis present

## 2023-02-04 DIAGNOSIS — J690 Pneumonitis due to inhalation of food and vomit: Secondary | ICD-10-CM | POA: Diagnosis present

## 2023-02-04 DIAGNOSIS — R54 Age-related physical debility: Secondary | ICD-10-CM | POA: Diagnosis present

## 2023-02-04 DIAGNOSIS — Z79899 Other long term (current) drug therapy: Secondary | ICD-10-CM | POA: Diagnosis not present

## 2023-02-04 DIAGNOSIS — E118 Type 2 diabetes mellitus with unspecified complications: Secondary | ICD-10-CM

## 2023-02-04 DIAGNOSIS — E1140 Type 2 diabetes mellitus with diabetic neuropathy, unspecified: Secondary | ICD-10-CM | POA: Diagnosis present

## 2023-02-04 DIAGNOSIS — Z681 Body mass index (BMI) 19 or less, adult: Secondary | ICD-10-CM | POA: Diagnosis not present

## 2023-02-04 DIAGNOSIS — L8990 Pressure ulcer of unspecified site, unspecified stage: Secondary | ICD-10-CM | POA: Insufficient documentation

## 2023-02-04 DIAGNOSIS — F419 Anxiety disorder, unspecified: Secondary | ICD-10-CM

## 2023-02-04 LAB — URINE DRUG SCREEN, QUALITATIVE (ARMC ONLY)
Amphetamines, Ur Screen: NOT DETECTED
Barbiturates, Ur Screen: NOT DETECTED
Benzodiazepine, Ur Scrn: NOT DETECTED
Cannabinoid 50 Ng, Ur ~~LOC~~: NOT DETECTED
Cocaine Metabolite,Ur ~~LOC~~: NOT DETECTED
MDMA (Ecstasy)Ur Screen: NOT DETECTED
Methadone Scn, Ur: NOT DETECTED
Opiate, Ur Screen: NOT DETECTED
Phencyclidine (PCP) Ur S: NOT DETECTED
Tricyclic, Ur Screen: NOT DETECTED

## 2023-02-04 LAB — BASIC METABOLIC PANEL
Anion gap: 9 (ref 5–15)
BUN: 12 mg/dL (ref 8–23)
CO2: 26 mmol/L (ref 22–32)
Calcium: 8.8 mg/dL — ABNORMAL LOW (ref 8.9–10.3)
Chloride: 100 mmol/L (ref 98–111)
Creatinine, Ser: 0.79 mg/dL (ref 0.61–1.24)
GFR, Estimated: >60 mL/min (ref >60)
Glucose, Bld: 137 mg/dL — ABNORMAL HIGH (ref 70–99)
Potassium: 3.3 mmol/L — ABNORMAL LOW (ref 3.5–5.1)
Sodium: 135 mmol/L (ref 135–145)

## 2023-02-04 LAB — COMPREHENSIVE METABOLIC PANEL
ALT: 8 U/L (ref 0–44)
AST: 22 U/L (ref 15–41)
Albumin: 3.8 g/dL (ref 3.5–5.0)
Alkaline Phosphatase: 174 U/L — ABNORMAL HIGH (ref 38–126)
Anion gap: 13 (ref 5–15)
BUN: 12 mg/dL (ref 8–23)
CO2: 24 mmol/L (ref 22–32)
Calcium: 9.3 mg/dL (ref 8.9–10.3)
Chloride: 97 mmol/L — ABNORMAL LOW (ref 98–111)
Creatinine, Ser: 0.93 mg/dL (ref 0.61–1.24)
GFR, Estimated: >60 mL/min (ref >60)
Glucose, Bld: 257 mg/dL — ABNORMAL HIGH (ref 70–99)
Potassium: 3.7 mmol/L (ref 3.5–5.1)
Sodium: 134 mmol/L — ABNORMAL LOW (ref 135–145)
Total Bilirubin: 0.7 mg/dL (ref 0.3–1.2)
Total Protein: 7.6 g/dL (ref 6.5–8.1)

## 2023-02-04 LAB — URINALYSIS, ROUTINE W REFLEX MICROSCOPIC
Bilirubin Urine: NEGATIVE
Glucose, UA: NEGATIVE mg/dL
Ketones, ur: NEGATIVE mg/dL
Nitrite: NEGATIVE
Protein, ur: 30 mg/dL — AB
RBC / HPF: >50 RBC/hpf (ref 0–5)
Specific Gravity, Urine: >1.046 — ABNORMAL HIGH (ref 1.005–1.030)
Squamous Epithelial / HPF: NONE SEEN /HPF (ref 0–5)
WBC, UA: >50 WBC/hpf (ref 0–5)
pH: 6 (ref 5.0–8.0)

## 2023-02-04 LAB — CBC
HCT: 38.7 % — ABNORMAL LOW (ref 39.0–52.0)
Hemoglobin: 12.3 g/dL — ABNORMAL LOW (ref 13.0–17.0)
MCH: 27.5 pg (ref 26.0–34.0)
MCHC: 31.8 g/dL (ref 30.0–36.0)
MCV: 86.4 fL (ref 80.0–100.0)
Platelets: 331 10*3/uL (ref 150–400)
RBC: 4.48 MIL/uL (ref 4.22–5.81)
RDW: 13.9 % (ref 11.5–15.5)
WBC: 11.7 10*3/uL — ABNORMAL HIGH (ref 4.0–10.5)
nRBC: 0 % (ref 0.0–0.2)

## 2023-02-04 LAB — PROTIME-INR
INR: 1.1 (ref 0.8–1.2)
Prothrombin Time: 13.8 seconds (ref 11.4–15.2)

## 2023-02-04 LAB — LIPID PANEL
Cholesterol: 189 mg/dL (ref 0–200)
HDL: 35 mg/dL — ABNORMAL LOW (ref >40)
LDL Cholesterol: 134 mg/dL — ABNORMAL HIGH (ref 0–99)
Total CHOL/HDL Ratio: 5.4 RATIO
Triglycerides: 99 mg/dL (ref <150)
VLDL: 20 mg/dL (ref 0–40)

## 2023-02-04 LAB — ECHOCARDIOGRAM COMPLETE BUBBLE STUDY
AV Peak grad: 8 mmHg
Ao pk vel: 1.41 m/s
Area-P 1/2: 3.5 cm2

## 2023-02-04 LAB — GLUCOSE, CAPILLARY
Glucose-Capillary: 132 mg/dL — ABNORMAL HIGH (ref 70–99)
Glucose-Capillary: 107 mg/dL — ABNORMAL HIGH (ref 70–99)
Glucose-Capillary: 67 mg/dL — ABNORMAL LOW (ref 70–99)
Glucose-Capillary: 134 mg/dL — ABNORMAL HIGH (ref 70–99)
Glucose-Capillary: 97 mg/dL (ref 70–99)
Glucose-Capillary: 109 mg/dL — ABNORMAL HIGH (ref 70–99)

## 2023-02-04 LAB — TROPONIN I (HIGH SENSITIVITY): Troponin I (High Sensitivity): 6 ng/L (ref <18)

## 2023-02-04 LAB — ETHANOL: Alcohol, Ethyl (B): <10 mg/dL (ref <10)

## 2023-02-04 MED ORDER — KETOROLAC TROMETHAMINE 15 MG/ML IJ SOLN
15.0000 mg | Freq: Once | INTRAMUSCULAR | Status: AC
  Administered 2023-02-04: 15 mg via INTRAVENOUS
  Filled 2023-02-04: qty 1

## 2023-02-04 MED ORDER — DULOXETINE HCL 30 MG PO CPEP
30.0000 mg | ORAL_CAPSULE | Freq: Every day | ORAL | Status: DC
  Administered 2023-02-04 – 2023-02-09 (×4): 30 mg via ORAL
  Filled 2023-02-04 (×4): qty 1

## 2023-02-04 MED ORDER — ATORVASTATIN CALCIUM 20 MG PO TABS
80.0000 mg | ORAL_TABLET | Freq: Every day | ORAL | Status: DC
  Administered 2023-02-06 – 2023-02-08 (×3): 80 mg via ORAL
  Filled 2023-02-04 (×3): qty 4

## 2023-02-04 MED ORDER — ASPIRIN 81 MG PO TBEC
81.0000 mg | DELAYED_RELEASE_TABLET | Freq: Every day | ORAL | Status: DC
  Administered 2023-02-04 – 2023-02-09 (×4): 81 mg via ORAL
  Filled 2023-02-04 (×4): qty 1

## 2023-02-04 MED ORDER — ALUM & MAG HYDROXIDE-SIMETH 200-200-20 MG/5ML PO SUSP
30.0000 mL | ORAL | Status: DC | PRN
  Administered 2023-02-04: 30 mL via ORAL
  Filled 2023-02-04: qty 30

## 2023-02-04 MED ORDER — DEXTROSE 50 % IV SOLN
12.5000 g | Freq: Once | INTRAVENOUS | Status: AC
  Administered 2023-02-04: 12.5 g via INTRAVENOUS
  Filled 2023-02-04: qty 50

## 2023-02-04 MED ORDER — OXYCODONE HCL 5 MG PO TABS
5.0000 mg | ORAL_TABLET | Freq: Every day | ORAL | Status: DC | PRN
  Administered 2023-02-04: 5 mg via ORAL
  Filled 2023-02-04: qty 1

## 2023-02-04 MED ORDER — INSULIN ASPART 100 UNIT/ML IJ SOLN
0.0000 [IU] | Freq: Every day | INTRAMUSCULAR | Status: DC
  Administered 2023-02-06: 2 [IU] via SUBCUTANEOUS
  Filled 2023-02-04: qty 1

## 2023-02-04 MED ORDER — SODIUM CHLORIDE 0.9 % IV SOLN
1.0000 g | INTRAVENOUS | Status: DC
  Administered 2023-02-04: 1 g via INTRAVENOUS
  Filled 2023-02-04: qty 10

## 2023-02-04 MED ORDER — AMOXICILLIN-POT CLAVULANATE 875-125 MG PO TABS
1.0000 | ORAL_TABLET | Freq: Two times a day (BID) | ORAL | Status: DC
  Administered 2023-02-04: 1 via ORAL
  Filled 2023-02-04: qty 1

## 2023-02-04 MED ORDER — SODIUM CHLORIDE 0.9 % IV SOLN
1.0000 g | INTRAVENOUS | Status: DC
  Administered 2023-02-05 – 2023-02-06 (×2): 1 g via INTRAVENOUS
  Filled 2023-02-04: qty 1
  Filled 2023-02-04: qty 10

## 2023-02-04 MED ORDER — KCL IN DEXTROSE-NACL 40-5-0.9 MEQ/L-%-% IV SOLN
INTRAVENOUS | Status: DC
  Filled 2023-02-04 (×2): qty 1000

## 2023-02-04 MED ORDER — CLOPIDOGREL BISULFATE 75 MG PO TABS
75.0000 mg | ORAL_TABLET | Freq: Every morning | ORAL | Status: DC
  Administered 2023-02-04 – 2023-02-09 (×4): 75 mg via ORAL
  Filled 2023-02-04 (×4): qty 1

## 2023-02-04 MED ORDER — CLONAZEPAM 0.5 MG PO TBDP
0.5000 mg | ORAL_TABLET | Freq: Once | ORAL | Status: AC
  Administered 2023-02-05: 0.5 mg via ORAL
  Filled 2023-02-04: qty 1

## 2023-02-04 MED ORDER — ACETAMINOPHEN 650 MG RE SUPP: 650.0000 mg | Freq: Four times a day (QID) | RECTAL | Status: DC | PRN

## 2023-02-04 MED ORDER — POTASSIUM CHLORIDE IN NACL 40-0.9 MEQ/L-% IV SOLN
INTRAVENOUS | Status: DC
  Filled 2023-02-04 (×3): qty 1000

## 2023-02-04 MED ORDER — ADULT MULTIVITAMIN W/MINERALS CH
1.0000 | ORAL_TABLET | Freq: Every day | ORAL | Status: DC
  Administered 2023-02-04 – 2023-02-09 (×4): 1 via ORAL
  Filled 2023-02-04 (×4): qty 1

## 2023-02-04 MED ORDER — VITAMIN D 25 MCG (1000 UNIT) PO TABS
2000.0000 [IU] | ORAL_TABLET | Freq: Every day | ORAL | Status: DC
  Administered 2023-02-04 – 2023-02-09 (×4): 2000 [IU] via ORAL
  Filled 2023-02-04 (×4): qty 2

## 2023-02-04 MED ORDER — ASPIRIN 81 MG PO CHEW
324.0000 mg | CHEWABLE_TABLET | Freq: Once | ORAL | Status: AC
  Administered 2023-02-04: 324 mg via ORAL
  Filled 2023-02-04: qty 4

## 2023-02-04 MED ORDER — POTASSIUM CHLORIDE CRYS ER 20 MEQ PO TBCR
40.0000 meq | EXTENDED_RELEASE_TABLET | Freq: Once | ORAL | Status: AC
  Administered 2023-02-04: 40 meq via ORAL
  Filled 2023-02-04: qty 2

## 2023-02-04 MED ORDER — ACETAMINOPHEN 325 MG PO TABS
650.0000 mg | ORAL_TABLET | Freq: Four times a day (QID) | ORAL | Status: DC | PRN
  Administered 2023-02-04 – 2023-02-07 (×3): 650 mg via ORAL
  Filled 2023-02-04 (×4): qty 2

## 2023-02-04 MED ORDER — ENOXAPARIN SODIUM 40 MG/0.4ML IJ SOSY
40.0000 mg | PREFILLED_SYRINGE | INTRAMUSCULAR | Status: DC
  Administered 2023-02-04 – 2023-02-09 (×6): 40 mg via SUBCUTANEOUS
  Filled 2023-02-04 (×6): qty 0.4

## 2023-02-04 MED ORDER — POTASSIUM CHLORIDE 10 MEQ/100ML IV SOLN
10.0000 meq | INTRAVENOUS | Status: DC
  Administered 2023-02-04: 10 meq via INTRAVENOUS
  Filled 2023-02-04: qty 100

## 2023-02-04 MED ORDER — INSULIN ASPART 100 UNIT/ML IJ SOLN
0.0000 [IU] | Freq: Three times a day (TID) | INTRAMUSCULAR | Status: DC
  Administered 2023-02-04 – 2023-02-06 (×3): 2 [IU] via SUBCUTANEOUS
  Administered 2023-02-07: 5 [IU] via SUBCUTANEOUS
  Administered 2023-02-07: 2 [IU] via SUBCUTANEOUS
  Administered 2023-02-07: 3 [IU] via SUBCUTANEOUS
  Administered 2023-02-08 (×2): 2 [IU] via SUBCUTANEOUS
  Administered 2023-02-08: 3 [IU] via SUBCUTANEOUS
  Administered 2023-02-09: 2 [IU] via SUBCUTANEOUS
  Filled 2023-02-04 (×10): qty 1

## 2023-02-04 MED ORDER — FOLIC ACID 1 MG PO TABS
1.0000 mg | ORAL_TABLET | Freq: Every day | ORAL | Status: DC
  Administered 2023-02-04 – 2023-02-09 (×4): 1 mg via ORAL
  Filled 2023-02-04 (×4): qty 1

## 2023-02-04 MED ORDER — STROKE: EARLY STAGES OF RECOVERY BOOK: Freq: Once | Status: AC

## 2023-02-04 MED ORDER — SODIUM CHLORIDE 0.9 % IV SOLN: INTRAVENOUS | Status: DC

## 2023-02-04 MED ORDER — ALBUTEROL SULFATE (2.5 MG/3ML) 0.083% IN NEBU: 2.5000 mg | INHALATION_SOLUTION | RESPIRATORY_TRACT | Status: DC | PRN

## 2023-02-04 MED ORDER — POTASSIUM CHLORIDE CRYS ER 20 MEQ PO TBCR: 40.0000 meq | EXTENDED_RELEASE_TABLET | Freq: Once | ORAL | Status: DC

## 2023-02-04 MED ORDER — ONDANSETRON HCL 4 MG/2ML IJ SOLN: 4.0000 mg | Freq: Four times a day (QID) | INTRAMUSCULAR | Status: DC | PRN

## 2023-02-04 MED ORDER — TRAZODONE HCL 50 MG PO TABS
25.0000 mg | ORAL_TABLET | Freq: Every evening | ORAL | Status: DC | PRN
  Administered 2023-02-04 – 2023-02-08 (×4): 25 mg via ORAL
  Filled 2023-02-04 (×5): qty 1

## 2023-02-04 MED ORDER — MAGNESIUM HYDROXIDE 400 MG/5ML PO SUSP: 30.0000 mL | Freq: Every day | ORAL | Status: DC | PRN

## 2023-02-04 MED ORDER — GLIPIZIDE 5 MG PO TABS
5.0000 mg | ORAL_TABLET | Freq: Every day | ORAL | Status: DC
  Administered 2023-02-04: 5 mg via ORAL
  Filled 2023-02-04 (×3): qty 1

## 2023-02-04 MED ORDER — PANTOPRAZOLE SODIUM 40 MG IV SOLR
40.0000 mg | INTRAVENOUS | Status: DC
  Administered 2023-02-05 – 2023-02-07 (×3): 40 mg via INTRAVENOUS
  Filled 2023-02-04 (×3): qty 10

## 2023-02-04 MED ORDER — PANTOPRAZOLE SODIUM 40 MG PO TBEC
40.0000 mg | DELAYED_RELEASE_TABLET | Freq: Every day | ORAL | Status: DC
  Administered 2023-02-04: 40 mg via ORAL
  Filled 2023-02-04: qty 1

## 2023-02-04 MED ORDER — ONDANSETRON HCL 4 MG PO TABS: 4.0000 mg | ORAL_TABLET | Freq: Four times a day (QID) | ORAL | Status: DC | PRN

## 2023-02-04 MED ORDER — LINACLOTIDE 72 MCG PO CAPS
72.0000 ug | ORAL_CAPSULE | Freq: Every day | ORAL | Status: DC
  Administered 2023-02-04 – 2023-02-09 (×4): 72 ug via ORAL
  Filled 2023-02-04 (×6): qty 1

## 2023-02-04 MED ORDER — FAMOTIDINE 20 MG PO TABS
40.0000 mg | ORAL_TABLET | Freq: Every day | ORAL | Status: DC
  Administered 2023-02-04: 40 mg via ORAL
  Filled 2023-02-04: qty 2

## 2023-02-04 MED ORDER — NICOTINE 14 MG/24HR TD PT24
14.0000 mg | MEDICATED_PATCH | Freq: Every day | TRANSDERMAL | Status: DC
  Administered 2023-02-04 – 2023-02-08 (×5): 14 mg via TRANSDERMAL
  Filled 2023-02-04 (×6): qty 1

## 2023-02-04 MED ORDER — IOHEXOL 350 MG/ML SOLN
75.0000 mL | Freq: Once | INTRAVENOUS | Status: AC | PRN
  Administered 2023-02-04: 75 mL via INTRAVENOUS

## 2023-02-04 NOTE — Progress Notes (Signed)
  Echocardiogram 2D Echocardiogram has been performed. A Saline Microcavitation (Bubble Study) was requested and performed.  Claretta Fraise 02/04/2023, 11:06 AM

## 2023-02-04 NOTE — Evaluation (Signed)
Clinical/Bedside Swallow Evaluation Patient Details  Name: Kenneth Franklin MRN: [id number] Date of Birth: 08-Apr-1946  Today's Date: 02/04/2023 Time: SLP Start Time (ACUTE ONLY): 39 SLP Stop Time (ACUTE ONLY): 1105 SLP Time Calculation (min) (ACUTE ONLY): 15 min  Past Medical History:  Past Medical History:  Diagnosis Date   Acute ischemic stroke ([place]) 2017   Alcohol abuse    Anxiety    Cancer ([place])    Depression    Diabetes mellitus without complication ([place])    Dyspnea    pcp knows and ordered rescue inhaler   Hyperlipidemia    Hypertension    Skin cancer    Squamous Cell Carcinoma In Situ   Past Surgical History:  Past Surgical History:  Procedure Laterality Date   CATARACT EXTRACTION Bilateral    COLONOSCOPY     ESOPHAGOGASTRODUODENOSCOPY (EGD) WITH PROPOFOL N/A 09/26/2022   Procedure: ESOPHAGOGASTRODUODENOSCOPY (EGD) WITH PROPOFOL;  Surgeon: Lesly Rubenstein, MD;  Location: [facility] ENDOSCOPY;  Service: Endoscopy;  Laterality: N/A;   NASOPHARYNGOSCOPY N/A 08/12/2020   Procedure: ENDOSCOPIC NASOPHARYNGOSCOPY WITH BIOPSY;  Surgeon: Margaretha Sheffield, MD;  Location: [facility] ORS;  Service: ENT;  Laterality: N/A;   PORTA CATH INSERTION N/A 08/24/2020   Procedure: PORTA CATH INSERTION;  Surgeon: Algernon Huxley, MD;  Location: [place] [facility];  Service: Cardiovascular;  Laterality: N/A;   PORTA CATH REMOVAL N/A 07/22/2021   Procedure: PORTA CATH REMOVAL;  Surgeon: Algernon Huxley, MD;  Location: [place] [facility];  Service: Cardiovascular;  Laterality: N/A;   HPI:  Patient is a 77 y.o. male with past medical history including nasopharyngeal cancer s/p chemo and radiation, dysphagia, alcohol abuse, CVA, DM, HTN, HLD, depression, anxiety, who presented 3/7 to ED with left sided weakness and fall; CT head was negative and pt was discharged home. Returned 02/03/23 with worsening weakness, found to have acute CVA, possible recurrence of nasopharyngeal cancer. MRI brain 02/04/23: "Scattered acute  infarcts in the right ACA-MCA border zone. No acute  hemorrhage or significant mass effect." CT Angio 02/04/23: "1. Moderate irregular narrowing of the lacerum segment of the right  ICA with surrounding mildly enhancing soft tissue along the posterior sphenoid sinus with associated bony destruction of the posterior wall and clivus. 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 Consider further characterization with sedated MRI of the face with and  without contrast. 2. 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 3. 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 4. Aortic Atherosclerosis"   Assessment / Plan / Recommendation  Clinical Impression  Patient presents with moderate-severe aspiration risk in the setting of new CVA, history of radiation for nasopharyngeal cancer with possible recurrence, and overt signs of aspiration at bedside with all consistencies. Patient exhibits wet, congested coughing immediately after presentation of thin and nectar liquids, suggestive of reduced airway protection. Pt endorses sensation of things "going down wrong." Delayed coughing present after trials of puree. Note regurgitation per pt and RN; basin noted to have capsule and applesauce as well as yellow sputum. Lingual symmetry appears WFL, however note difficulty with lateralization and strength when pressing tongue to right side (left weakness). Recommend NPO, will likely need instrumental study for further assessment of oropharyngeal dysphagia, to be completed Monday. Will follow up at bedside for reassessment tomorrow.    SLP Visit Diagnosis: Dysphagia, oropharyngeal phase (R13.12)    Aspiration Risk  Moderate aspiration risk;Severe aspiration risk  Diet Recommendation NPO   Medication Administration: Via alternative means    Other  Recommendations Oral Care Recommendations:  Oral care QID Caregiver Recommendations: Avoid jello, ice cream, thin soups, popsicles;Remove water pitcher;Have oral suction available    Recommendations for follow up therapy are one component of a multi-disciplinary discharge planning process, led by the attending physician.  Recommendations may be updated based on patient status, additional functional criteria and insurance authorization.  Follow up Recommendations Skilled nursing-short term rehab (<3 hours/day)      Assistance Recommended at Discharge    Functional Status Assessment Patient has had a recent decline in their functional status and/or demonstrates limited ability to make significant improvements in function in a reasonable and predictable amount of time  Frequency and Duration min 2x/week  2 weeks       Prognosis Prognosis for improved oropharyngeal function: Fair (-good; likely some chronicity to deficits given prior history of XRT) Barriers to Reach Goals: Cognitive deficits      Swallow Study   General Date of Onset: 02/03/23 HPI: Patient is a 77 y.o. male with past medical history including nasopharyngeal cancer s/p chemo and radiation, dysphagia, alcohol abuse, CVA, DM, HTN, HLD, depression, anxiety, who presented 3/7 to ED with left sided weakness and fall; CT head was negative and pt was discharged home. Returned 02/03/23 with worsening weakness, found to have acute CVA. MRI brain 02/04/23: "Scattered acute infarcts in the right ACA-MCA border zone. No acute  hemorrhage or significant mass effect." CT Angio 02/04/23: "1. Moderate irregular narrowing of the lacerum segment of the right  ICA with surrounding mildly enhancing soft tissue along the posterior sphenoid sinus with associated bony destruction of the posterior wall and clivus. 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 Consider further  characterization with sedated MRI of the face with and  without contrast. 2. 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 3. 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 4. Aortic Atherosclerosis Type of Study: Bedside Swallow Evaluation Previous Swallow Assessment: During prior admission was on dysphagia 2/thin (2021), has been seen during acute admissions at OSH, was advanced to regular/thin. Diet Prior to this Study: Regular;Thin liquids (Level 0) Temperature Spikes Noted: No Respiratory Status: Room air History of Recent Intubation: No Behavior/Cognition: Alert;Cooperative Oral Cavity Assessment: Other (comment) (poor dentition) Oral Care Completed by SLP: Yes Oral Cavity - Dentition: Poor condition;Missing dentition (reports he broke teeth when falling last week) Vision: Functional for self-feeding Self-Feeding Abilities: Able to feed self Patient Positioning: Upright in bed Baseline Vocal Quality: Wet Volitional Cough: Congested;Wet Volitional Swallow: Able to elicit    Oral/Motor/Sensory Function Overall Oral Motor/Sensory Function: Mild impairment Facial ROM: Within Functional Limits Facial Symmetry: Within Functional Limits Facial Strength: Within Functional Limits Facial Sensation: Within Functional Limits Lingual ROM: Reduced right Lingual Symmetry: Within Functional Limits Lingual Strength: Reduced Lingual Sensation: Within Functional Limits Velum: Within Functional Limits (edema noted L side) Mandible: Within Functional Limits   Ice Chips Ice chips: Impaired Presentation: Spoon Pharyngeal Phase Impairments: Cough - Immediate   Thin Liquid Thin Liquid: Impaired Presentation: Cup Oral Phase Impairments:  (WFl) Pharyngeal  Phase Impairments: Wet Vocal Quality;Cough - Immediate    Nectar Thick Nectar Thick Liquid: Impaired Presentation: Cup Oral Phase Impairments:  (WFL) Pharyngeal Phase Impairments: Wet Vocal Quality;Cough - Immediate   Honey  Thick Honey Thick Liquid: Not tested   Puree Puree: Impaired Oral Phase  Impairments:  (WFL) Pharyngeal Phase Impairments: Cough - Delayed   Solid    Deneise Lever, [place], [place] Pathologist Office: [phone] ASCOM: [phone]  Solid: Not tested      Aliene Altes 02/04/2023,11:48 AM

## 2023-02-04 NOTE — Progress Notes (Signed)
Mansy, MD at the bedside. Kenneth Franklin

## 2023-02-04 NOTE — Assessment & Plan Note (Signed)
-   We will continue statin therapy and check fasting lipids. 

## 2023-02-04 NOTE — Assessment & Plan Note (Signed)
-   We will continue PPI therapy 

## 2023-02-04 NOTE — Consult Note (Signed)
Neurology Consultation Reason for Consult: Stroke on MRI Requesting Physician: Lucienne Minks  CC: Left-sided weakness  History is obtained from:   HPI: Kenneth Franklin is a 77 y.o. 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  She notes that he was at his baseline when she left the ED but did worsen at home and is very frustrated that she had to bring him right back  Lives at home alone but has sitters at home, per 12/17/2022 oncology note was having weight loss, anorexia, dry mouth.  He does continue to smoke per daughter; she does not provide him with cigarettes but notes that his sitters will or other people that he calls will provide them to him.  She notes that he has actually increased weight slightly from 114 pounds to 120 pounds at home.  No clear etiology of his anorexia, daughter reports he is not having vomiting, does not complain about things not tasting good, is not having dysphagia, just does not want to eat.  She reports he has not been complaining about headaches  He has been having worsening bilateral lower extremity weakness, plan for vascular surgery evaluation for worsening peripheral artery disease  Last MRI brain in December 2023 for headache did not show any clear residual malignancy  ROS: Unable to obtain due to altered mental status.   Past Medical History:  Diagnosis Date   Acute ischemic stroke ([place]) 2017   Alcohol abuse    Anxiety    Cancer ([place])    Depression    Diabetes mellitus without complication ([place])    Dyspnea    pcp knows and ordered rescue inhaler   Hyperlipidemia    Hypertension    Skin cancer     Squamous Cell Carcinoma In Situ   Past Surgical History:  Procedure Laterality Date   CATARACT EXTRACTION Bilateral    COLONOSCOPY     ESOPHAGOGASTRODUODENOSCOPY (EGD) WITH PROPOFOL N/A 09/26/2022   Procedure: ESOPHAGOGASTRODUODENOSCOPY (EGD) WITH PROPOFOL;  Surgeon: Lesly Rubenstein, MD;  Location: [facility] ENDOSCOPY;  Service: Endoscopy;  Laterality: N/A;   NASOPHARYNGOSCOPY N/A 08/12/2020   Procedure: ENDOSCOPIC NASOPHARYNGOSCOPY WITH BIOPSY;  Surgeon: Margaretha Sheffield, MD;  Location: [facility] ORS;  Service: ENT;  Laterality: N/A;   PORTA CATH INSERTION N/A 08/24/2020   Procedure: PORTA CATH INSERTION;  Surgeon: Algernon Huxley, MD;  Location: [place] [facility];  Service: Cardiovascular;  Laterality: N/A;   PORTA CATH REMOVAL N/A 07/22/2021   Procedure: PORTA CATH REMOVAL;  Surgeon: Algernon Huxley, MD;  Location: [place] [facility];  Service: Cardiovascular;  Laterality: N/A;   Current Outpatient Medications  Medication Instructions   acetaminophen (TYLENOL) 975 mg, Oral, Every 6 hours PRN   ADVAIR HFA 115-21 MCG/ACT inhaler 1 puff, Inhalation, 2 times daily   albuterol (PROVENTIL) (2.5 MG/3ML) 0.083% nebulizer solution    Albuterol Sulfate, sensor, 108 (90 Base) MCG/ACT AEPB 1-2 puffs, Inhalation, Every 6 hours PRN   aspirin EC 81 mg, Oral, Daily, Swallow whole.   atorvastatin (LIPITOR) 20 mg, Oral, Daily   clopidogrel (PLAVIX) 75 mg, Oral, Every morning   DULoxetine (CYMBALTA) 30 mg, Oral, Daily   escitalopram (LEXAPRO) 20 mg, BH-each morning   famotidine (PEPCID) 40 mg, Oral, Daily   feeding  supplement (ENSURE ENLIVE / ENSURE PLUS) LIQD 237 mLs, Oral, 3 times daily between meals   fluticasone (FLONASE) 50 MCG/ACT nasal spray 2 sprays, Daily   folic acid (FOLVITE) 1 mg, Oral, Daily   glipiZIDE (GLUCOTROL) 5 MG tablet 1 tablet, Oral, Daily   linaclotide (LINZESS) 72 mcg, Oral, Daily before breakfast   losartan (COZAAR) 100 mg, Oral, Daily   Multiple Vitamin (MULTIVITAMIN WITH MINERALS) TABS  tablet 1 tablet, Oral, Daily   ondansetron (ZOFRAN-ODT) 8 mg, Oral, Every 8 hours PRN   ondansetron (ZOFRAN-ODT) 8 mg, Oral, Every 8 hours PRN   oxyCODONE (OXY IR/ROXICODONE) 5 mg, Oral, 2 times daily PRN   oxycodone (OXY-IR) 5 mg, Oral, Daily PRN   pantoprazole (PROTONIX) 40 mg, Oral, Daily   pilocarpine (SALAGEN) 5 mg, 3 times daily   polyethylene glycol powder (GLYCOLAX/MIRALAX) 17 GM/SCOOP powder Oral   polyvinyl alcohol (LIQUIFILM TEARS) 1.4 % ophthalmic solution Ophthalmic   QC Vitamin D3 2,000 Units, Oral, Daily   sodium chloride (OCEAN) 0.65 % nasal spray Nasal     Family History  Problem Relation Age of Onset   Diabetes Brother      Social History:  reports that he has been smoking cigars and cigarettes. He has a 40.00 pack-year smoking history. He has never used smokeless tobacco. He reports that he does not currently use alcohol. He reports that he does not use drugs.   Exam: Current vital signs: BP 119/70 (BP Location: Left Arm)   Pulse 84   Temp 98 F (36.7 C) (Oral)   Resp 20   Ht 6' (1.829 m)   Wt 51.6 kg   SpO2 93%   BMI 15.43 kg/m  Vital signs in last 24 hours: Temp:  [97.9 F (36.6 C)-98.4 F (36.9 C)] 98 F (36.7 C) (03/09 0834) Pulse Rate:  [80-119] 84 (03/09 0834) Resp:  [16-22] 20 (03/09 0834) BP: (110-125)/(70-77) 119/70 (03/09 0834) SpO2:  [93 %-100 %] 93 % (03/09 0834) Weight:  [51.6 kg] 51.6 kg (03/09 0152)   Physical Exam  Constitutional: Appears frail, chronically ill, emaciated Psych: Affect irritable but intermittently cooperative/redirectable Eyes: No scleral injection HENT: No oropharyngeal obstruction.  Extremely poor dentition MSK: Diffuse muscle wasting Cardiovascular: Cool feet bilaterally Respiratory: Intermittent stable smoker's cough GI: Soft.  No distension.  Skin: Warm dry and intact visible skin  Neuro: Mental Status: Patient is awake, alert, oriented to person, age, but not month and unable to give significant  details of history.  He does have some intermittent left-sided neglect, no aphasia Cranial Nerves: II: Visual Fields are notable for difficulty with the left upper quadrant and neglect to double simultaneous stimuli. Pupils are equal, round, and reactive to light.   III,IV, VI: Tracks examiner bilaterally V: Facial sensation is reduced in the left V2 and V3 VII: Facial movement is symmetric.  VIII: hearing is intact to voice, noting hard of hearing at baseline X: Uvula elevates symmetrically XI: Shoulder shrug is symmetric, but slightly slower to activate the left side. XII: tongue is midline without atrophy or fasciculations.  Motor: Diffusely atrophied.  4+/5 throughout the right side.  Deltoid and hip flexion weakness bilaterally.  4- to 4+ on the left upper extremity in an upper motor neuron pattern.  4- left hip flexion; remainder of left lower extremity testing limited by irritability and potentially some mild left-sided motor neglect as well Sensory: Intact to light touch throughout arms and legs without extinction Deep Tendon Reflexes: Unable to elicit right brachioradialis.  2+  left brachioradialis.  3+ bilateral patellae but more spread on the left side compared to the right Cerebellar: FNF and HKS are intact within limits of weakness Gait:  Deferred   NIHSS total 10 Score breakdown: 1 point for drowsiness, 1 point for incorrectly stating the month is July/August, 1 point for partial left hemianopia, 1 point for left arm drift, 2 points for left leg drift, 2 points for right leg drift, 1 point for dysarthria, 1 point for neglect Performed at 12:30 PM   I have reviewed labs in epic and the results pertinent to this consultation are: Lab Results  Component Value Date   CHOL 189 02/04/2023   HDL 35 (L) 02/04/2023   LDLCALC 134 (H) 02/04/2023   TRIG 99 02/04/2023   CHOLHDL 5.4 02/04/2023    Lab Results  Component Value Date   HGBA1C 6.0 (H) 11/05/2022     I have  reviewed the images obtained:  CTA head/neck personally reviewed, agree with radiology:   1. Moderate irregular narrowing of the lacerum segment of the right ICA with surrounding mildly enhancing soft tissue along the posterior sphenoid sinus with associated bony destruction of the posterior wall and clivus. 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 Consider further characterization with sedated MRI of the face with and without contrast. 2. 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 3. 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 4. Aortic Atherosclerosis (ICD10-I70.0).  MRI Brain personally reviewed, agree with radiology:   Scattered acute infarcts in the right ACA-MCA border zone. No acute hemorrhage or significant mass effect.  ECHO Sonographer Comments: Technically challenging study due to limited  acoustic windows, no parasternal window and no apical window. Image  acquisition challenging due to respiratory motion. Limited images were  obtained from the subcostal window.  IMPRESSIONS   1. Left ventricular ejection fraction, by estimation, is 55 to 60%. The  left ventricle has normal function. Challenging images, grossly the left  ventricle has no regional wall motion abnormalities. Left ventricular  diastolic parameters are consistent  with Grade I diastolic dysfunction (impaired relaxation).   2. Right ventricular systolic function is normal. The right ventricular  size is normal.   3. The mitral valve is normal in structure. No evidence of mitral valve  regurgitation. No evidence of mitral stenosis.   4. The aortic valve has an indeterminant number of cusps. There is  moderate calcification of the aortic valve. Aortic valve regurgitation is  not visualized. Aortic valve Vmax measures 1.41 m/s. Challenging images,  aortic valve  gradient not well  estimated, degree of stenosis likely underestimated.   5. The inferior vena cava is normal in size with greater than 50%  respiratory variability, suggesting right atrial pressure of 3 mmHg.   6. Agitated saline contrast bubble study was negative, with no evidence  of any interatrial shunt.   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  From a vasculopathy perspective patient is already medically optimized with dual antiplatelet therapy.  He is not meeting cholesterol goal (LDL less than 70) and therefore I would escalate his home dose of atorvastatin.  Regarding potential for direct tumor spread, recommend consultation with oncology.  Extended discussion with daughter for approximately 17 minutes via phone, validating her frustrations with limited options for medical treatment, including discussion that from  a stroke perspective he is already medically maximized other than atorvastatin dosing and at high risk of recurrent stroke  Recommendations: -Continue dual antiplatelet therapy, for at least 90-day course if not already indicated for lifelong dual antiplatelets  -Okay to interrupt Plavix if needed for oncologic procedures -Permissive hypertension for 24 to 48 hours and then start to gradually normalize blood pressure -Increase atorvastatin 20 mg to 80 mg, timed nightly for optimal effect -Appreciate PT/OT/SLP -Appreciate primary team/oncology recommendations for further evaluation of potential recurrent malignancy; would include MRI brain with and without contrast if MRI face with and without contrast is pursued -Inpatient neurology will be available as needed going forward, please reach out if any additional questions or concerns arise  Lesleigh Noe MD-PhD [facility] [phone] [facility] coverage for [facility] is from 8 AM to 4 AM  in-house and 4 PM to 8 PM by telephone/video. 8 PM to 8 AM emergent questions or overnight urgent questions should be addressed to Teleneurology On-call or [facility] neurohospitalist; contact information can be found on AMION  Recommendations conveyed to primary team via secure chat

## 2023-02-04 NOTE — Progress Notes (Addendum)
       CROSS COVER NOTE  NAME: Kenneth Franklin MRN: [id number] DOB : 1946/07/22    HPI/Events of Note   Report: Nurse reports blood coming from end of penis with patient complaints of needing to void since arriving to unit and only able to pass 50 ml urine thus far On review of chart: Admitted with left sided weakness, concerns of evolving CV or TIApost several falls at home  Assessment and  Interventions   Assessment: Mild leukocytosis on cbc CT HEAD WO CONTRAST  3/8 IMPRESSION: 1. No acute intracranial abnormality. 2. No acute displaced fracture or traumatic listhesis of the cervical spine. 3. Almost complete opacification of the right maxillary and ethmoid sinuses. Mucosal thickening of the left maxillary and bilateral frontal sinuses. Mucosal thickening of the sphenoid sinuses with surgical changes and associated persistent similar-appearing osseous erosion of the sphenoid sinus wall posteriorly, skull base, and clivus. Continued outpatient MRI follow-up recommended. Chem panel hypokalemia  U/A on admission without evidence of infection, or hematuria Plan: Bladder scan - 0 - start maintenance fluids Check procalcitonin - started rocephin with concerns of UTI and sinusitis - discontinue as appropriate 60 meq IV potassium supp for k of 2.2 Repeat blood pressure - bolus 500 NS if  MAP below [place] NP [facility]

## 2023-02-04 NOTE — Assessment & Plan Note (Addendum)
-   The patient has left-sided weakness.  This could be related to an evolving CVA or TIA. - The patient will be admitted to an observation medical telemetry bed.   - We will follow neuro checks q.4 hours for 24 hours.   - The patient will be continued on baby aspirin and Plavix. - Will obtain a brain MRI without contrast as well as CTA of the head and neck and 2D echo with bubble study .  - A neurology consultation  as well as physical/occupation/speech therapy consults will be obtained in a.m.Marland Kitchen - I notified Dr. Curly [place] about the patient. - The patient will be placed on statin therapy and fasting lipids will be checked.

## 2023-02-04 NOTE — Assessment & Plan Note (Addendum)
-   He has associated diabetic neuropathy. -He will be placed on supplement coverage with NovoLog. - We will continue Glucotrol.

## 2023-02-04 NOTE — Consult Note (Addendum)
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 Turning and repositioning is in place, heels are to be floated.  [place] nursing team will not follow, but will remain available to this patient, the nursing and medical teams.  Please re-consult if needed.  Thank you for inviting [place] to participate in this patient's Plan of Care.  Maudie Flakes, MSN, RN, CNS, [place], Serita Grammes, [employer], [employer] phone:  [phone]

## 2023-02-04 NOTE — Assessment & Plan Note (Signed)
-   We will continue his antihypertensives with permissive parameters. 

## 2023-02-04 NOTE — H&P (Addendum)
[place]   PATIENT NAME: Kenneth Franklin    MR#:  [id number]  DATE OF BIRTH:  [birth year]  DATE OF ADMISSION:  02/03/2023  PRIMARY CARE PHYSICIAN: Verita Lamb, NP   Patient is coming from: Home  REQUESTING/REFERRING PHYSICIAN: Hinda Kehr, MD  CHIEF COMPLAINT:   Chief Complaint  Patient presents with   Gait Problem    HISTORY OF PRESENT ILLNESS:  Kenneth Franklin is a 77 y.o. male with medical history significant for anxiety, depression, hypertension, dyslipidemia, EtOH abuse and CVA, presented to the emergency room with his 27 with acute left sided weakness that started before midnight, without new numbness or paresthesias.  He denied any dysarthria or expressive dysphasia or dysphagia.  He was having gait abnormality secondary to his weakness.  He later had a fall.  He thought her left leg gave out.  No head injuries.  On 3/7 he was seen here in the ED after having a fall when he was sitting down on the edge of her bed and missed falling on the ground and hitting the back of his right shoulder.  He did not have any neurological deficits then and therefore when workup came back negative he was discharged.  He was still able to ambulate with walker at his baseline.  This morning he felt worse but was still able to ambulate before his weakness started.   No urinary or stool incontinence.  No tinnitus or vertigo.  No witnessed seizures.  No chest pain or palpitations.  No nausea or vomiting or abdominal pain.  No dysuria, oliguria or hematuria or flank pain.  ED Course: When he came to the ER BP was 144/96 with respiratory rate of 21 and otherwise normal vital signs.  Labs revealed a sodium of 134 and chloride of 97 with blood glucose of 257 and alk phos 174 with otherwise unremarkable CMP.  CBC showed WBC of 11.4 with neutrophilia.  Portable chest x-ray showed no acute cardiopulmonary disease. EKG as reviewed by me : Sinus tachycardia with a rate of 121 with Q waves  anteroseptally. Imaging: Portable chest x-ray showed no acute cardiopulmonary disease.  The patient was given 4 baby aspirin.  He will be admitted to an observation medical telemetry bed for further evaluation and management.   PAST MEDICAL HISTORY:   Past Medical History:  Diagnosis Date   Acute ischemic stroke ([place]) 2017   Alcohol abuse    Anxiety    Cancer ([place])    Depression    Diabetes mellitus without complication ([place])    Dyspnea    pcp knows and ordered rescue inhaler   Hyperlipidemia    Hypertension    Skin cancer    Squamous Cell Carcinoma In Situ    PAST SURGICAL HISTORY:   Past Surgical History:  Procedure Laterality Date   CATARACT EXTRACTION Bilateral    COLONOSCOPY     ESOPHAGOGASTRODUODENOSCOPY (EGD) WITH PROPOFOL N/A 09/26/2022   Procedure: ESOPHAGOGASTRODUODENOSCOPY (EGD) WITH PROPOFOL;  Surgeon: Lesly Rubenstein, MD;  Location: [facility] END[facility];  Service: Endoscopy;  Laterality: N/A;   NASOPHARYNGOSCOPY N/A 08/12/2020   Procedure: ENDOSCOPIC NASOPHARYNGOSCOPY WITH BIOPSY;  Surgeon: Margaretha Sheffield, MD;  Location: [facility];  Service: ENT;  Laterality: N/A;   PORTA CATH INSERTION N/A 08/24/2020   Procedure: PORTA CATH INSERTION;  Surgeon: Algernon Huxley, MD;  Location: [facility];  Service: Cardiovascular;  Laterality: N/A;   PORTA CATH REMOVAL N/A 07/22/2021   Procedure: PORTA CATH REMOVAL;  Surgeon: Algernon Huxley, MD;  Location: [facility];  Service: Cardiovascular;  Laterality: N/A;    SOCIAL HISTORY:   Social History   Tobacco Use   Smoking status: Some Days    Packs/day: 1.00    Years: 40.00    Total pack years: 40.00    Types: Cigars, Cigarettes    Last attempt to quit: 08/12/2019    Years since quitting: 3.4   Smokeless tobacco: Never  Substance Use Topics   Alcohol use: Not Currently    Comment: H/O ETOH ABUSE BUT DENIES DRINKING DURING 08-11-20 INTERVIEW    FAMILY HISTORY:   Family History  Problem Relation Age of Onset    Diabetes Brother     DRUG ALLERGIES:   Allergies  Allergen Reactions   Propoxyphene     Unknown reaction    REVIEW OF SYSTEMS:   ROS As per history of present illness. All pertinent systems were reviewed above. Constitutional, HEENT, cardiovascular, respiratory, GI, GU, musculoskeletal, neuro, psychiatric, endocrine, integumentary and hematologic systems were reviewed and are otherwise negative/unremarkable except for positive findings mentioned above in the HPI.   MEDICATIONS AT HOME:   Prior to Admission medications   Medication Sig Start Date End Date Taking? Authorizing Provider  acetaminophen (TYLENOL) 325 MG tablet Take 975 mg by mouth every 6 (six) hours as needed.    [provider]  albuterol (PROVENTIL) (2.5 MG/3ML) 0.083% nebulizer solution  06/14/21   [provider]  Albuterol Sulfate, sensor, 108 (90 Base) MCG/ACT AEPB Inhale 1-2 puffs into the lungs every 6 (six) hours as needed (Wheezing or Shortness of breath).    [provider]  aspirin EC 81 MG tablet Take 81 mg by mouth daily. Swallow whole.    [provider]  Cholecalciferol (QC VITAMIN D3) 50 MCG (2000 UT) CAPS Take 2,000 Units by mouth daily.    [provider]  clopidogrel (PLAVIX) 75 MG tablet Take 75 mg by mouth every morning. 07/14/22   [provider]  DULoxetine (CYMBALTA) 30 MG capsule Take 30 mg by mouth daily.    [provider]  escitalopram (LEXAPRO) 20 MG tablet Take 20 mg by mouth every morning.  Patient not taking: Reported on 12/21/2022  09/01/22  [provider]  famotidine (PEPCID) 40 MG tablet Take 40 mg by mouth daily. 06/11/21   [provider]  feeding supplement (ENSURE ENLIVE / ENSURE PLUS) LIQD Take 237 mLs by mouth 3 (three) times daily between meals. Patient not taking: Reported on 12/21/2022 11/07/22   Annita Brod, MD  fluticasone [facility]) 50 MCG/ACT nasal spray Place 2 sprays into the nose  daily. Patient not taking: Reported on 12/21/2022    [provider]  folic acid (FOLVITE) 1 MG tablet Take 1 mg by mouth daily. 05/31/22   [provider]  glipiZIDE (GLUCOTROL) 5 MG tablet Take 1 tablet by mouth daily. 06/23/21   [provider]  linaclotide (LINZESS) 72 MCG capsule Take 72 mcg by mouth daily before breakfast.    [provider]  losartan (COZAAR) 100 MG tablet Take 100 mg by mouth daily. Patient not taking: Reported on 12/21/2022 07/29/22   [provider]  Multiple Vitamin (MULTIVITAMIN WITH MINERALS) TABS tablet Take 1 tablet by mouth daily. 11/08/22   Annita Brod, MD  ondansetron (ZOFRAN-ODT) 8 MG disintegrating tablet Take 8 mg by mouth every 8 (eight) hours as needed. 08/18/22   [provider]  oxycodone (OXY-IR) 5 MG capsule  Take 5 mg by mouth daily as needed.    [provider]  pantoprazole (PROTONIX) 40 MG tablet Take 40 mg by mouth daily. 08/15/22   [provider]  pilocarpine (SALAGEN) 5 MG tablet Take 5 mg by mouth 3 (three) times daily. Patient not taking: Reported on 12/21/2022    [provider]  polyethylene glycol powder (GLYCOLAX/MIRALAX) 17 GM/SCOOP powder Take by mouth. 03/14/21   [provider]  polyvinyl alcohol (LIQUIFILM TEARS) 1.4 % ophthalmic solution Apply to eye. 11/01/20   [provider]  sodium chloride (OCEAN) 0.65 % nasal spray Place into the nose. 03/30/21   [provider]  prochlorperazine (COMPAZINE) 10 MG tablet Take 1 tablet (10 mg total) by mouth every 6 (six) hours as needed (Nausea or vomiting). 09/08/20 12/28/20  Lequita Asal, MD      VITAL SIGNS:  Blood pressure 125/77, pulse 80, temperature 97.9 F (36.6 C), temperature source Oral, resp. rate 16, SpO2 100 %.  PHYSICAL EXAMINATION:  Physical Exam  GENERAL:  77 y.o.-year-old Caucasian male patient lying in the bed with no acute distress.  EYES: Pupils equal, round,  reactive to light and accommodation. No scleral icterus. Extraocular muscles intact.  HEENT: Head atraumatic, normocephalic. Oropharynx and nasopharynx clear.  NECK:  Supple, no jugular venous distention. No thyroid enlargement, no tenderness.  LUNGS: Normal breath sounds bilaterally, no wheezing, rales,rhonchi or crepitation. No use of accessory muscles of respiration.  CARDIOVASCULAR: Regular rate and rhythm, S1, S2 normal. No murmurs, rubs, or gallops.  ABDOMEN: Soft, nondistended, nontender. Bowel sounds present. No organomegaly or mass.  EXTREMITIES: No pedal edema, cyanosis, or clubbing.  NEUROLOGIC: Cranial nerves II through XII are intact. Muscle strength 5/5 in right upper and lower extremities, 4/5 in left upper extremity and 3/5 on the left lower extremity.  Sensation intact to light touch. Gait not checked.  PSYCHIATRIC: The patient is alert and oriented x 3.  Normal affect and good eye contact. SKIN: No obvious rash, lesion, or ulcer.   LABORATORY PANEL:   CBC Recent Labs  Lab 02/03/23 2335  WBC 11.4*  HGB 13.1  HCT 41.5  PLT 415*   ------------------------------------------------------------------------------------------------------------------  Chemistries  Recent Labs  Lab 02/03/23 2335  NA 134*  K 3.7  CL 97*  CO2 24  GLUCOSE 257*  BUN 12  CREATININE 0.93  CALCIUM 9.3  AST 22  ALT 8  ALKPHOS 174*  BILITOT 0.7   ------------------------------------------------------------------------------------------------------------------  Cardiac Enzymes No results for input(s): "TROPONINI" in the last 168 hours. ------------------------------------------------------------------------------------------------------------------  RADIOLOGY:  CT HEAD WO CONTRAST (5MM)  Result Date: 02/04/2023 CLINICAL DATA:  Head trauma, minor (Age >= 65y); Neck trauma (Age >= 65y) EXAM: CT HEAD WITHOUT CONTRAST CT CERVICAL SPINE WITHOUT CONTRAST TECHNIQUE: Multidetector CT imaging of  the head and cervical spine was performed following the standard protocol without intravenous contrast. Multiplanar CT image reconstructions of the cervical spine were also generated. RADIATION DOSE REDUCTION: This exam was performed according to the departmental dose-optimization program which includes automated exposure control, adjustment of the mA and/or kV according to patient size and/or use of iterative reconstruction technique. COMPARISON:  MRI head 11/05/2022, CT head 11/04/2022, CT head 04/30/2021 FINDINGS: CT HEAD FINDINGS Brain: No evidence of large-territorial acute infarction. No parenchymal hemorrhage. No mass lesion. No extra-axial collection. No mass effect or midline shift. No hydrocephalus. Basilar cisterns are patent. Vascular: No hyperdense vessel. Skull: Similar-appearing skull base osseous erosion (3:18). No acute fracture or focal lesion. Sinuses/Orbits: Almost complete opacification  of the right maxillary sinus. Mucosal thickening of the left maxillary sinus. Almost complete opacification of the right ethmoid sinus. Mucosal thickening of bilateral frontal sinuses. Mucosal thickening of the sphenoid sinuses with surgical changes and associated persistent similar-appearing osseous erosion of the sphenoid sinus wall posteriorly and the clivus (5:31, 4:36).Paranasal sinuses and mastoid air cells are clear. The orbits are unremarkable. Other: None. CT CERVICAL SPINE FINDINGS Alignment: Mild retrolisthesis of C3 on C4 and C4 on C5. Skull base and vertebrae: Multilevel moderate degenerative changes of the spine with associated multilevel moderate severe osseous neural foraminal stenosis most prominent at the right C6-C7 level. No acute fracture. No aggressive appearing focal osseous lesion or focal pathologic process. Soft tissues and spinal canal: No prevertebral fluid or swelling. No visible canal hematoma. Upper chest: Unremarkable. Other: Atherosclerotic plaque of the carotid arteries within  the neck and the branches off of the aortic arch. IMPRESSION: 1. No acute intracranial abnormality. 2. No acute displaced fracture or traumatic listhesis of the cervical spine. 3. Almost complete opacification of the right maxillary and ethmoid sinuses. Mucosal thickening of the left maxillary and bilateral frontal sinuses. Mucosal thickening of the sphenoid sinuses with surgical changes and associated persistent similar-appearing osseous erosion of the sphenoid sinus wall posteriorly, skull base, and clivus. Continued outpatient MRI follow-up recommended. Electronically Signed   By: Iven Finn M.D.   On: 02/04/2023 00:02   CT Cervical Spine Wo Contrast  Result Date: 02/04/2023 CLINICAL DATA:  Head trauma, minor (Age >= 65y); Neck trauma (Age >= 65y) EXAM: CT HEAD WITHOUT CONTRAST CT CERVICAL SPINE WITHOUT CONTRAST TECHNIQUE: Multidetector CT imaging of the head and cervical spine was performed following the standard protocol without intravenous contrast. Multiplanar CT image reconstructions of the cervical spine were also generated. RADIATION DOSE REDUCTION: This exam was performed according to the departmental dose-optimization program which includes automated exposure control, adjustment of the mA and/or kV according to patient size and/or use of iterative reconstruction technique. COMPARISON:  MRI head 11/05/2022, CT head 11/04/2022, CT head 04/30/2021 FINDINGS: CT HEAD FINDINGS Brain: No evidence of large-territorial acute infarction. No parenchymal hemorrhage. No mass lesion. No extra-axial collection. No mass effect or midline shift. No hydrocephalus. Basilar cisterns are patent. Vascular: No hyperdense vessel. Skull: Similar-appearing skull base osseous erosion (3:18). No acute fracture or focal lesion. Sinuses/Orbits: Almost complete opacification of the right maxillary sinus. Mucosal thickening of the left maxillary sinus. Almost complete opacification of the right ethmoid sinus. Mucosal thickening  of bilateral frontal sinuses. Mucosal thickening of the sphenoid sinuses with surgical changes and associated persistent similar-appearing osseous erosion of the sphenoid sinus wall posteriorly and the clivus (5:31, 4:36).Paranasal sinuses and mastoid air cells are clear. The orbits are unremarkable. Other: None. CT CERVICAL SPINE FINDINGS Alignment: Mild retrolisthesis of C3 on C4 and C4 on C5. Skull base and vertebrae: Multilevel moderate degenerative changes of the spine with associated multilevel moderate severe osseous neural foraminal stenosis most prominent at the right C6-C7 level. No acute fracture. No aggressive appearing focal osseous lesion or focal pathologic process. Soft tissues and spinal canal: No prevertebral fluid or swelling. No visible canal hematoma. Upper chest: Unremarkable. Other: Atherosclerotic plaque of the carotid arteries within the neck and the branches off of the aortic arch. IMPRESSION: 1. No acute intracranial abnormality. 2. No acute displaced fracture or traumatic listhesis of the cervical spine. 3. Almost complete opacification of the right maxillary and ethmoid sinuses. Mucosal thickening of the left maxillary and bilateral frontal sinuses. Mucosal thickening of  the sphenoid sinuses with surgical changes and associated persistent similar-appearing osseous erosion of the sphenoid sinus wall posteriorly, skull base, and clivus. Continued outpatient MRI follow-up recommended. Electronically Signed   By: Iven Finn M.D.   On: 02/04/2023 00:02   DG Chest Portable 1 View  Result Date: 02/02/2023 CLINICAL DATA:  Cough, confusion EXAM: PORTABLE CHEST 1 VIEW COMPARISON:  11/04/2022 FINDINGS: Lungs are clear.  No pleural effusion or pneumothorax. The heart is normal in size.  Thoracic aortic atherosclerosis. IMPRESSION: No evidence of acute cardiopulmonary disease. Electronically Signed   By: Julian Hy M.D.   On: 02/02/2023 21:40      IMPRESSION AND PLAN:  Assessment  and Plan: * CVA (cerebral vascular accident) ([place]) - The patient has left-sided weakness.  This could be related to an evolving CVA or TIA. - The patient will be admitted to an observation medical telemetry bed.   - We will follow neuro checks q.4 hours for 24 hours.   - The patient will be continued on baby aspirin and Plavix. - Will obtain a brain MRI without contrast as well as CTA of the head and neck and 2D echo with bubble study .  - A neurology consultation  as well as physical/occupation/speech therapy consults will be obtained in a.m.Marland Kitchen - I notified Dr. Curly Shores about the patient. - The patient will be placed on statin therapy and fasting lipids will be checked.   Essential hypertension - We will continue his antihypertensives with permissive parameters.  Type 2 diabetes mellitus with complication, without long-term current use of insulin ([facility]) - He has associated diabetic neuropathy. -He will be placed on supplement coverage with NovoLog. - We will continue Glucotrol.  COPD (chronic obstructive pulmonary disease) ([facility]) - We will continue his Adderall and hold off long-acting beta agonist.  Dyslipidemia - We will continue statin therapy and check fasting lipids.  Anxiety and depression - We will continue Lexapro and Cymbalta.  GERD without esophagitis - We will continue PPI therapy.       DVT prophylaxis: Lovenox.  Advanced Care Planning:  Code Status: full code.  Family Communication:  The plan of care was discussed in details with the patient (and family). I answered all questions. The patient agreed to proceed with the above mentioned plan. Further management will depend upon hospital course. Disposition Plan: Back to previous home environment Consults called: Neurology All the records are reviewed and case discussed with ED provider.  Status is: Observation   At the time of the admission, it appears that the appropriate admission status for this patient is  inpatient.  This is judged to be reasonable and necessary in order to provide the required intensity of service to ensure the patient's safety given the presenting symptoms, physical exam findings and initial radiographic and laboratory data in the context of comorbid conditions.  The patient requires inpatient status due to high intensity of service, high risk of further deterioration and high frequency of surveillance required.  I certify that at the time of admission, it is my clinical judgment that the patient will require hospital care extending less than 2 midnights.                            Dispo: The patient is from: Home              Anticipated d/c is to: Home  Patient currently is not medically stable to d/c.              Difficult to place patient: No  Christel Mormon M.D on 02/04/2023 at 2:31 AM  [facility]   From 7 PM-7 AM, contact night-coverage [email or web address]  CC: Primary care physician; Verita Lamb, NP

## 2023-02-04 NOTE — Assessment & Plan Note (Signed)
-   We will continue his Adderall and hold off long-acting beta agonist.

## 2023-02-04 NOTE — Progress Notes (Signed)
  Progress Note   Patient: Kenneth Franklin GUR:[id number] DOB: 23-Jan-1946 DOA: 02/03/2023     0 DOS: the patient was seen and examined on 02/04/2023   Brief hospital course:  Assessment and Plan: * CVA (cerebral vascular accident) [facility]) - Neurology consult appreciated  - ASA 81 mg PO daily  - Plavix 75 mg PO daily  - Lipitor 80 mg PO daily  - IV ceftriaxone 1 g daily to cover for any possible aspiration   Essential hypertension - Allow permissive HTN   Type 2 diabetes mellitus with complication, without long-term current use of insulin (HCC) - Glipizide 5 mg PO daily  - Novolog SS ACHS   COPD (chronic obstructive pulmonary disease) (HCC) - Albuterol q4 hr PRN   Dyslipidemia - Cymbalta as above   Anxiety and depression - Cymbalta 30 mg PO daily   GERD without esophagitis - IV protonix 40 mg daily   Hypokalemia  - IV NS 40 K+ @ 75 cc/hr   DVT prophylaxis: Lovenox 40 mg sq daily  GI prophylaxis: Protonix 40 mg IV daily      Subjective: Pt seen and examined at the bedside. Brain MRI did show acute CVA (scattered) in the R ACA-MCA border. Oncology Dr. Darrall Dears was consulted today at the request of the neurology service.   Physical Exam: Vitals:   02/04/23 0152 02/04/23 0548 02/04/23 0834 02/04/23 1216  BP: 125/77 110/72 119/70 104/74  Pulse: 80 96 84 81  Resp: 16 18 20 19   Temp: 97.9 F (36.6 C) 98.1 F (36.7 C) 98 F (36.7 C) 98.3 F (36.8 C)  TempSrc: Oral  Oral   SpO2: 100% 96% 93% 98%  Weight: 51.6 kg     Height: 6' (1.829 m)      Physical Exam Constitutional:      Comments: Awake but confused   HENT:     Head: Normocephalic.  Cardiovascular:     Rate and Rhythm: Normal rate and regular rhythm.  Pulmonary:     Effort: Pulmonary effort is normal.  Abdominal:     General: Abdomen is flat.     Palpations: Abdomen is soft.  Musculoskeletal:        General: Normal range of motion.     Cervical back: Neck supple.  Skin:    General: Skin is warm.   Neurological:     Mental Status: He is disoriented.  Psychiatric:     Comments: Unable to ascertain      Data Reviewed:   Disposition: Status is: Observation  Planned Discharge Destination: Barriers to discharge: Acute CVA and confusion     Time spent: 35 minutes  Author: Lucienne Minks , MD 02/04/2023 1:50 PM  For on call review [email or web address].

## 2023-02-04 NOTE — Evaluation (Signed)
Occupational Therapy Evaluation Patient Details Name: Kenneth Franklin MRN: [id number] DOB: Mar 18, 1946 Today's Date: 02/04/2023   History of Present Illness Kenneth Franklin is a 69 Patient is a 77 y.o. male with past medical history including nasopharyngeal cancer s/p chemo and radiation, dysphagia, alcohol abuse, CVA, DM, HTN, HLD, depression, anxiety, who presented 3/7 to ED with left sided weakness and fall; CT head was negative and pt was discharged home. Returned 02/03/23 with worsening weakness, found to have acute CVA, possible recurrence of nasopharyngeal cancer. MRI brain 02/04/23: "Scattered acute infarcts in the right ACA-MCA border zone. No acute  hemorrhage or significant mass effect." CT Angio 02/04/23: "1. Moderate irregular narrowing of the lacerum segment of the right  ICA with surrounding mildly enhancing soft tissue along the posterior sphenoid sinus with associated bony destruction of the posterior wall and clivus. 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 Prior to hospital admission, pt was residing at home alone with a PCA visiting regularly for safety. Per secure TC with daughter PCA recently left and they are unsure if she will return or if pt will find a new PCA. Per daughter, pt has had a sudden decline in function over the last several days with gradual decline occurring over the last month or so. She stated pt requires regular encouragement to participate in BADL management, but is able to perform household mobility independent and physically bathe/dress himself when he does. Pt presents to acute OT demonstrating impaired ADL performance and functional mobility 2/2 generalized weakness, (no focal deficit appreciated however assessment limited 2/2 pt refusal to participate), decreased cognition ( recently scored 7/30 on the Kenneth Franklin see SLP evaluation for additional detail), and decreased safety  awareness (See OT problem list for additional deficits). Pt currently requires MOD A for UB ADL management at bed level MAX A for LB ADL Management.  Pt would benefit from skilled OT services to address noted impairments and functional limitations (see below for any additional details) in order to maximize safety and independence while minimizing falls risk and caregiver burden. Upon hospital discharge, recommend STR to maximize pt safety and return to PLOF.       Recommendations for follow up therapy are one component of a multi-disciplinary discharge planning process, led by the attending physician.  Recommendations may be updated based on patient status, additional functional criteria and insurance authorization.   Follow Up Recommendations  Skilled nursing-short term rehab (<3 hours/day)     Assistance Recommended at Discharge Frequent or constant Supervision/Assistance  Patient can return home with the following A lot of help with bathing/dressing/bathroom;A lot of help with walking and/or transfers;Assist for transportation;Assistance with cooking/housework;Help with stairs or ramp for entrance;Assistance with feeding;Direct supervision/assist for financial management;Direct supervision/assist for medications management    Functional Status Assessment  Patient has had a recent decline in their functional status and demonstrates the ability to make significant improvements in function in a reasonable and predictable amount of time.  Equipment Recommendations  Other (comment) (Defer)    Recommendations for Other Services       Precautions / Restrictions        Mobility Bed Mobility               General bed mobility comments: Deferred. Pt adamantly declined 2/2 fatigue despite education and encouragement.    Transfers  Balance                                           ADL either performed or assessed with clinical  judgement   ADL Overall ADL's : Needs assistance/impaired Eating/Feeding: NPO Eating/Feeding Details (indicate cue type and reason): Pending MBSS                                   General ADL Comments: Pt significantly functionally limited 2/2 generalized weakness, decreased activity tolerance, decreased safety awareness, and decreased cognition. Anticipate MOD A for LB ADL management, SET UP/SUPERVISOIN for seated UB ADL management with cueing for safety and sequencing 2/2 cognition.     Vision Patient Visual Report: No change from baseline Additional Comments: Will continue to monitor within functional context.     Perception     Praxis      Pertinent Vitals/Pain Pain Assessment Pain Assessment: No/denies pain     Hand Dominance Right   Extremity/Trunk Assessment Upper Extremity Assessment Upper Extremity Assessment: Generalized weakness (assessment limited 2/2 pt refusal. Able to lift BUE/BLE against gravity at bed level, grip strength grossly weak but symmetrical.)   Lower Extremity Assessment Lower Extremity Assessment: Generalized weakness;Defer to PT evaluation       Communication Communication Communication: HOH   Cognition Arousal/Alertness: Lethargic Behavior During Therapy: WFL for tasks assessed/performed Overall Cognitive Status: Impaired/Different from baseline Area of Impairment: Orientation, Following commands, Safety/judgement, Awareness                 Orientation Level: Disoriented to, Place, Time, Situation     Following Commands: Follows one step commands inconsistently Safety/Judgement: Decreased awareness of safety, Decreased awareness of deficits Awareness: Intellectual   General Comments: Able to state name, believes he is "in my living room". Per daughter generally oriented at baseline, but does "get into moods where he wont do anything".     General Comments       Exercises Other Exercises Other Exercises:  Pt/daughter educated on role of OT in acute setting, importance of functional activity during hospital stay, and DC recs.   Shoulder Instructions      Home Living Family/patient expects to be discharged to:: Private residence Living Arrangements: Alone Available Help at Discharge: Family;Personal care attendant (Per dtr, PCA recently quit and she is unsure if he will be able to find another one.) Type of Home: House Home Access: Ramped entrance     Home Layout: One level     Bathroom Shower/Tub: Teacher, early years/pre: Standard     Home Equipment: Cane - single point;BSC/3in1;Grab bars - tub/shower;Rollator (4 wheels)   Additional Comments: Pt pleasantly confused t/o session spoke with dtr over the phone who confirmed home set up/PLOF.  Lives With: Alone    Prior Functioning/Environment Prior Level of Function : Independent/Modified Independent             Mobility Comments: Per daughter, pt not very active but does perform HH mobility independently. ADLs Comments: Requires encouragement to perform ADL management, but is able to complete bathing and dressing tasks without assist from family/PCA. Has had a steady decline with recent stark decrease in functional ability per daughter.        OT Problem List: Decreased strength;Decreased coordination;Decreased range of motion;Decreased cognition;Decreased activity tolerance;Impaired balance (sitting  and/or standing);Decreased knowledge of use of DME or AE;Decreased safety awareness;Impaired UE functional use      OT Treatment/Interventions: Self-care/ADL training;Therapeutic exercise;Therapeutic activities;DME and/or AE instruction;Patient/family education;Balance training;Energy conservation;Visual/perceptual remediation/compensation;Neuromuscular education    OT Goals(Current goals can be found in the care plan section) Acute Rehab OT Goals Patient Stated Goal: To feel better OT Goal Formulation: With  patient/family Time For Goal Achievement: 02/18/23 Potential to Achieve Goals: Good  OT Frequency: Min 2X/week    Co-evaluation              AM-PAC OT "6 Clicks" Daily Activity     Outcome Measure Help from another person eating meals?: Total (NPO) Help from another person taking care of personal grooming?: A Little Help from another person toileting, which includes using toliet, bedpan, or urinal?: A Lot Help from another person bathing (including washing, rinsing, drying)?: A Lot Help from another person to put on and taking off regular upper body clothing?: A Little Help from another person to put on and taking off regular lower body clothing?: A Lot 6 Click Score: 13   End of Session    Activity Tolerance: Patient limited by lethargy;Other (comment) (limited by cognition.) Patient left: in bed;with call bell/phone within reach;with bed alarm set  OT Visit Diagnosis: Other abnormalities of gait and mobility (R26.89);Muscle weakness (generalized) (M62.81);Other symptoms and signs involving cognitive function;Hemiplegia and hemiparesis Hemiplegia - Right/Left: Left Hemiplegia - dominant/non-dominant: Non-Dominant Hemiplegia - caused by: Cerebral infarction                Time: [id number] OT Time Calculation (min): 20 min Charges:  OT General Charges $OT Visit: 1 Visit OT Evaluation $OT Eval Moderate Complexity: 1 Mod OT Treatments $Self Care/Home Management : 8-22 mins  Shara Blazing, M.S., OTR/L 02/04/23, 1:02 PM

## 2023-02-04 NOTE — Assessment & Plan Note (Signed)
-   We will continue Lexapro and Cymbalta.

## 2023-02-04 NOTE — Consult Note (Signed)
[facility]  [phone] [phone]  ID: Kenneth Franklin OB: 05/01/1946  MR#: [id number]  [id number]  Patient Care Team: Verita Lamb, NP as PCP - General Margaretha Sheffield, MD (Otolaryngology) Jason Coop, NP (Inactive) as Nurse Practitioner (Hospice and Palliative Medicine) Rica Koyanagi, MD as Referring Physician (Geriatric Medicine) Verlon Au, NP as Nurse Practitioner (Nurse Practitioner) Christean Grief, RN as Registered Nurse Cammie Sickle, MD as Consulting Physician (Oncology)  REFERRING PROVIDER: Dr. Feliberto Gottron  REASON FOR REFERRAL: Concern for recurrence of nasopharyngeal cancer.  HPI: Kenneth Franklin is a 77 y.o. male with past medical history of anxiety, depression, hypertension, hyperlipidemia, alcohol use and CVA presented to ED on 02/04/2023 with acute left-sided weakness.  On 3/7 he was seen in ED after having a fall and hitting in the back of the shoulder.  He did not have any neurological deficit and workup was negative and was discharged.  CT head was unremarkable.  CT cervical spine showed osseous erosion of the sphenoid sinus wall posteriorly, skull base and clivus and MRI was recommended.  MRI brain without contrast was highly Motion degraded.  It showed scattered acute infarcts in the right ACA-MCA border zone.  No acute hemorrhage or mass effect.  CTA head and neck showed narrowing of the right ICA with surrounding mildly enhancing soft tissue along the posterior sphenoid sinus with associated bony destruction of the posterior wall and clivus.  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  Recommended further characterization with MRI face with and without contrast with sedation.  Echocardiogram with bubble study showed EF of 55 to 60%.  Images were challenging due to respiratory motion.  Patient  was seen today at bedside.  He was feeling sleepy.  He continues to have left-sided weakness.  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  Patient declined adjuvant chemotherapy.  Treatment was completed in November 2021.  PET CT scan from June 2023 showed reduction in the size of soft tissue mass centered in the sphenoid sinus as well as in the activity.  Small focus of activity does remain along the posterior right aspect of the sphenoid sinus.  He follows with Dr. Jacinto Reap.  Last seen in January 2024 and as needed visit was advised.  REVIEW OF SYSTEMS:   ROS  As per HPI. Otherwise, a complete review of systems is negative.  PAST MEDICAL HISTORY: Past Medical History:  Diagnosis Date   Acute ischemic stroke ([place]) 2017   Alcohol abuse    Anxiety    Cancer ([place])    Depression    Diabetes mellitus without complication ([place])    Dyspnea    pcp knows and ordered rescue inhaler   Hyperlipidemia    Hypertension    Skin cancer    Squamous Cell Carcinoma In Situ    PAST SURGICAL HISTORY: Past Surgical History:  Procedure Laterality Date   CATARACT EXTRACTION Bilateral    COLONOSCOPY     ESOPHAGOGASTRODUODENOSCOPY (EGD) WITH PROPOFOL N/A 09/26/2022   Procedure: ESOPHAGOGASTRODUODENOSCOPY (EGD) WITH PROPOFOL;  Surgeon: Lesly Rubenstein, MD;  Location: [facility] ENDOSCOPY;  Service: Endoscopy;  Laterality: N/A;   NASOPHARYNGOSCOPY N/A 08/12/2020   Procedure: ENDOSCOPIC NASOPHARYNGOSCOPY WITH BIOPSY;  Surgeon: Margaretha Sheffield, MD;  Location: [facility] ORS;  Service: ENT;  Laterality: N/A;   PORTA CATH INSERTION N/A 08/24/2020   Procedure: Glori Luis CATH  INSERTION;  Surgeon: Algernon Huxley, MD;  Location: [place];  Service: Cardiovascular;  Laterality: N/A;   PORTA CATH REMOVAL N/A 07/22/2021   Procedure: PORTA CATH REMOVAL;  Surgeon: Algernon Huxley, MD;  Location: [place] [facility];  Service: Cardiovascular;  Laterality:  N/A;    FAMILY HISTORY: Family History  Problem Relation Age of Onset   Diabetes Brother     HEALTH MAINTENANCE: Social History   Tobacco Use   Smoking status: Some Days    Packs/day: 1.00    Years: 40.00    Total pack years: 40.00    Types: Cigars, Cigarettes    Last attempt to quit: 08/12/2019    Years since quitting: 3.4   Smokeless tobacco: Never  Vaping Use   Vaping Use: Never used  Substance Use Topics   Alcohol use: Not Currently    Comment: H/O ETOH ABUSE BUT DENIES DRINKING DURING 08-11-20 INTERVIEW   Drug use: No     Allergies  Allergen Reactions   Propoxyphene     Unknown reaction    Current Facility-Administered Medications  Medication Dose Route Frequency Provider Last Rate Last Admin   [START ON 02/05/2023]  stroke: early stages of recovery book   Does not apply Once Mansy, Jan A, MD       0.9 % NaCl with KCl 40 mEq / L  infusion   Intravenous Continuous Lucienne Minks, MD 75 mL/hr at 02/04/23 1325 New Bag at 02/04/23 1325   acetaminophen (TYLENOL) tablet 650 mg  650 mg Oral Q6H PRN Mansy, Jan A, MD   650 mg at 02/04/23 1024   Or   acetaminophen (TYLENOL) suppository 650 mg  650 mg Rectal Q6H PRN Mansy, Jan A, MD       albuterol (PROVENTIL) (2.5 MG/3ML) 0.083% nebulizer solution 2.5 mg  2.5 mg Nebulization Q4H PRN Mansy, Jan A, MD       alum & mag hydroxide-simeth (MAALOX/MYLANTA) 200-200-20 MG/5ML suspension 30 mL  30 mL Oral Q4H PRN Mansy, Jan A, MD   30 mL at 02/04/23 0236   aspirin EC tablet 81 mg  81 mg Oral Daily Mansy, Jan A, MD   81 mg at 02/04/23 1024   atorvastatin (LIPITOR) tablet 80 mg  80 mg Oral QHS Bhagat, Srishti L, MD       [START ON 02/05/2023] cefTRIAXone (ROCEPHIN) 1 g in sodium chloride 0.9 % 100 mL IVPB  1 g Intravenous Q24H Lucienne Minks, MD       cholecalciferol (VITAMIN D3) 25 MCG (1000 UNIT) tablet 2,000 Units  2,000 Units Oral Daily Mansy, Jan A, MD   2,000 Units at 02/04/23 1024   clopidogrel (PLAVIX) tablet 75 mg  75 mg Oral q  morning Mansy, Jan A, MD   75 mg at 02/04/23 1024   DULoxetine (CYMBALTA) DR capsule 30 mg  30 mg Oral Daily Mansy, Jan A, MD   30 mg at 02/04/23 1024   enoxaparin (LOVENOX) injection 40 mg  40 mg Subcutaneous Q24H Mansy, Jan A, MD   40 mg at [id number]   folic acid (FOLVITE) tablet 1 mg  1 mg Oral Daily Mansy, Jan A, MD   1 mg at 02/04/23 1024   glipiZIDE (GLUCOTROL) tablet 5 mg  5 mg Oral Daily Mansy, Jan A, MD   5 mg at 02/04/23 1024   insulin aspart (novoLOG) injection 0-15 Units  0-15 Units Subcutaneous TID WC Mansy, Arvella Merles, MD   2 Units at 02/04/23  [id number]   insulin aspart (novoLOG) injection 0-5 Units  0-5 Units Subcutaneous QHS Mansy, Jan A, MD       linaclotide Rolan Lipa) capsule 72 mcg  72 mcg Oral QAC breakfast Mansy, Jan A, MD   72 mcg at 02/04/23 0905   magnesium hydroxide (MILK OF MAGNESIA) suspension 30 mL  30 mL Oral Daily PRN Mansy, Jan A, MD       multivitamin with minerals tablet 1 tablet  1 tablet Oral Daily Mansy, Jan A, MD   1 tablet at 02/04/23 1024   nicotine (NICODERM CQ - dosed in mg/24 hours) patch 14 mg  14 mg Transdermal Daily Sharion Settler, NP   14 mg at 02/04/23 1023   ondansetron (ZOFRAN) tablet 4 mg  4 mg Oral Q6H PRN Mansy, Jan A, MD       Or   ondansetron [facility]) injection 4 mg  4 mg Intravenous Q6H PRN Mansy, Jan A, MD       oxyCODONE (Oxy IR/ROXICODONE) immediate release tablet 5 mg  5 mg Oral Daily PRN Mansy, Jan A, MD   5 mg at 02/04/23 0351   [START ON 02/05/2023] pantoprazole (PROTONIX) injection 40 mg  40 mg Intravenous Q24H Lucienne Minks, MD       traZODone (DESYREL) tablet 25 mg  25 mg Oral QHS PRN Mansy, Jan A, MD   25 mg at 02/04/23 [id number]   Facility-Administered Medications Ordered in Other Encounters  Medication Dose Route Frequency Provider Last Rate Last Admin   sodium chloride flush (NS) 0.9 % injection 10 mL  10 mL Intravenous PRN Nolon Stalls C, MD   10 mL at 12/03/20 1138    OBJECTIVE: Vitals:   02/04/23 0834 02/04/23 1216  BP:  119/70 104/74  Pulse: 84 81  Resp: 20 19  Temp: 98 F (36.7 C) 98.3 F (36.8 C)  SpO2: 93% 98%     Body mass index is 15.43 kg/m.      General: Well-developed, well-nourished, no acute distress. Eyes: Pink conjunctiva, anicteric sclera. HEENT: Normocephalic, moist mucous membranes, clear oropharnyx. Lungs: Clear to auscultation bilaterally. Heart: Regular rate and rhythm. No rubs, murmurs, or gallops. Abdomen: Soft, nontender, nondistended. No organomegaly noted, normoactive bowel sounds. Musculoskeletal: No edema, cyanosis, or clubbing. Neuro: Alert, answering all questions appropriately. Cranial nerves grossly intact. Skin: No rashes or petechiae noted. Psych: Normal affect. Lymphatics: No cervical, calvicular, axillary or inguinal LAD.   LAB RESULTS:  Lab Results  Component Value Date   NA 135 02/04/2023   K 3.3 (L) 02/04/2023   CL 100 02/04/2023   CO2 26 02/04/2023   GLUCOSE 137 (H) 02/04/2023   BUN 12 02/04/2023   CREATININE 0.79 02/04/2023   CALCIUM 8.8 (L) 02/04/2023   PROT 7.6 02/03/2023   ALBUMIN 3.8 02/03/2023   AST 22 02/03/2023   ALT 8 02/03/2023   ALKPHOS 174 (H) 02/03/2023   BILITOT 0.7 02/03/2023   GFRNONAA >60 02/04/2023   GFRAA >60 08/18/2020    Lab Results  Component Value Date   WBC 11.7 (H) 02/04/2023   NEUTROABS 9.3 (H) 02/03/2023   HGB 12.3 (L) 02/04/2023   HCT 38.7 (L) 02/04/2023   MCV 86.4 02/04/2023   PLT 331 02/04/2023    Lab Results  Component Value Date   TIBC 221 (L) 10/27/2020   FERRITIN 471 (H) 10/27/2020   IRONPCTSAT 19 10/27/2020     STUDIES: ECHOCARDIOGRAM COMPLETE BUBBLE STUDY  Result Date: 02/04/2023    ECHOCARDIOGRAM REPORT   Patient Name:  Kenneth Franklin Date of Exam: 02/04/2023 Medical Rec #:  [id number]       Height:       72.0 in Accession #:    [id number]      Weight:       113.8 lb Date of Birth:  03/07/1946        BSA:          1.676 m Patient Age:    62 years        BP:           110/72 mmHg Patient Gender: M                HR:           91 bpm. Exam Location:  [facility] Procedure: 2D Echo and Saline Contrast Bubble Study Indications:     Stroke I63.9  History:         Patient has no prior history of Echocardiogram examinations.  Sonographer:     Kathlen Brunswick RDCS Referring Phys:  [id number] [place] Diagnosing Phys: Ida Rogue MD  Sonographer Comments: Technically challenging study due to limited acoustic windows, no parasternal window and no apical window. Image acquisition challenging due to respiratory motion. Limited images were obtained from the subcostal window. IMPRESSIONS  1. Left ventricular ejection fraction, by estimation, is 55 to 60%. The left ventricle has normal function. Challenging images, grossly the left ventricle has no regional wall motion abnormalities. Left ventricular diastolic parameters are consistent with Grade I diastolic dysfunction (impaired relaxation).  2. Right ventricular systolic function is normal. The right ventricular size is normal.  3. The mitral valve is normal in structure. No evidence of mitral valve regurgitation. No evidence of mitral stenosis.  4. The aortic valve has an indeterminant number of cusps. There is moderate calcification of the aortic valve. Aortic valve regurgitation is not visualized. Aortic valve Vmax measures 1.41 m/s. Challenging images, aortic valve gradient not well estimated, degree of stenosis likely underestimated.  5. The inferior vena cava is normal in size with greater than 50% respiratory variability, suggesting right atrial pressure of 3 mmHg.  6. Agitated saline contrast bubble study was negative, with no evidence of any interatrial shunt. FINDINGS  Left Ventricle: Left ventricular ejection fraction, by estimation, is 55 to 60%. The left ventricle has normal function. The left ventricle has no regional wall motion abnormalities. The left ventricular internal cavity size was normal in size. There is  no left ventricular hypertrophy. Left  ventricular diastolic parameters are consistent with Grade I diastolic dysfunction (impaired relaxation). Right Ventricle: The right ventricular size is normal. No increase in right ventricular wall thickness. Right ventricular systolic function is normal. Left Atrium: Left atrial size was normal in size. Right Atrium: Right atrial size was normal in size. Pericardium: There is no evidence of pericardial effusion. Mitral Valve: The mitral valve is normal in structure. There is mild calcification of the mitral valve leaflet(s). No evidence of mitral valve regurgitation. No evidence of mitral valve stenosis. Tricuspid Valve: The tricuspid valve is normal in structure. Tricuspid valve regurgitation is mild . No evidence of tricuspid stenosis. Aortic Valve: The aortic valve has an indeterminant number of cusps. There is moderate calcification of the aortic valve. Aortic valve regurgitation is not visualized. Aortic valve peak gradient measures 8.0 mmHg. Pulmonic Valve: The pulmonic valve was normal in structure. Pulmonic valve regurgitation is not visualized. No evidence of pulmonic stenosis. Aorta: The aortic root is normal in size and structure.  Venous: The inferior vena cava is normal in size with greater than 50% respiratory variability, suggesting right atrial pressure of 3 mmHg. IAS/Shunts: No atrial level shunt detected by color flow Doppler. Agitated saline contrast was given intravenously to evaluate for intracardiac shunting. Agitated saline contrast bubble study was negative, with no evidence of any interatrial shunt.   Diastology LV e' medial:    5.55 cm/s LV E/e' medial:  11.3 LV e' lateral:   4.35 cm/s LV E/e' lateral: 14.5  AORTIC VALVE AV Vmax:      141.00 cm/s AV Peak Grad: 8.0 mmHg MITRAL VALVE MV Area (PHT): 3.50 cm MV Decel Time: 217 msec MV E velocity: 62.90 cm/s MV A velocity: 61.80 cm/s MV E/A ratio:  1.02 Ida Rogue MD Electronically signed by Ida Rogue MD Signature Date/Time:  02/04/2023/11:46:41 AM    Final    CT ANGIO HEAD NECK W WO CM  Result Date: 02/04/2023 CLINICAL DATA:  Neuro deficit, acute, stroke suspected. EXAM: CT HEAD WITHOUT CONTRAST CT ANGIOGRAPHY OF THE HEAD AND NECK TECHNIQUE: Contiguous axial images were obtained from the base of the skull through the vertex without intravenous contrast. Multidetector CT imaging of the head and neck was performed using the standard protocol during bolus administration of intravenous contrast. Multiplanar CT image reconstructions and MIPs were obtained to evaluate the vascular anatomy. Carotid stenosis measurements (when applicable) are obtained utilizing NASCET criteria, using the distal internal carotid diameter as the denominator. RADIATION DOSE REDUCTION: This exam was performed according to the departmental dose-optimization program which includes automated exposure control, adjustment of the mA and/or kV according to patient size and/or use of iterative reconstruction technique. CONTRAST:  28m OMNIPAQUE IOHEXOL 350 MG/ML SOLN COMPARISON:  MRI brain 02/04/2023.  CT head 02/03/2023. FINDINGS: CT HEAD Brain: Subtle loss of gray-white differentiation along the posterior right ACA-MCA border zone, better evaluated on same day brain MRI. No acute intracranial hemorrhage or significant mass effect. Unchanged severe chronic small-vessel disease and moderate generalized volume loss. Vascular: No hyperdense vessel. Skull: No calvarial fracture or suspicious bone lesion. Skull base is unremarkable. Sinuses/Orbits: Chronic right maxillary sinusitis with near-complete opacification of the right ethmoid air cells. Mild mucosal disease of the left maxillary sinus. Orbits are unremarkable. CTA NECK Aortic arch: Three-vessel arch configuration. Atherosclerotic calcifications of the aortic arch and arch vessel origins. Arch vessel origins are patent. Right carotid system: Predominantly soft plaque along the proximal right cervical ICA results in  approximately 60% stenosis by NASCET criteria (axial image 418 series 9). Left carotid system: Calcified plaque along the left carotid bulb and proximal ICA without stenosis. Vertebral arteries:Right dominant vertebral artery with mixed plaque along the right V1 and V2 segments, as well as calcified plaque along the V3 and V4 segments. No significant stenosis or evidence of dissection. At least moderate stenosis of the left vertebral artery origin due to mixed plaque. Skeleton: Cervical spondylosis with mild spinal canal stenosis at C5-6 and C6-7. Other neck: Unremarkable. CTA HEAD Anterior circulation: Moderate irregular narrowing of the lacerum segment of the right ICA with surrounding mildly enhancing soft tissue along the posterior sphenoid sinus with associated bony destruction of the posterior wall and clivus (image 271 series 9). Atherosclerotic calcifications of the left ICA without stenosis. The proximal ACAs and MCAs are patent without stenosis or aneurysm. Distal branches are symmetric. Posterior circulation: Normal basilar artery. The SCAs, AICAs and PICAs are patent proximally. The PCAs are patent proximally without stenosis or aneurysm. Distal branches are symmetric. Venous sinuses: As  permitted by early phase of contrast, patent. Anatomic variants: None. IMPRESSION: 1. Moderate irregular narrowing of the lacerum segment of the right ICA with surrounding mildly enhancing soft tissue along the posterior sphenoid sinus with associated bony destruction of the posterior wall and clivus. 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 Consider further characterization with sedated MRI of the face with and without contrast. 2. 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 3. 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 4. Aortic Atherosclerosis (ICD10-I70.0). Electronically  Signed   By: Emmit Alexanders M.D.   On: 02/04/2023 10:39   MR BRAIN WO CONTRAST  Result Date: 02/04/2023 CLINICAL DATA:  Neuro deficit, acute, stroke suspected. EXAM: MRI HEAD WITHOUT CONTRAST TECHNIQUE: Multiplanar, multiecho pulse sequences of the brain and surrounding structures were obtained without intravenous contrast. COMPARISON:  Head CT 02/03/2023.  MRI brain 11/05/2022. FINDINGS: Motion degraded study. Brain: Scattered acute infarcts in the right ACA-MCA border zone. No acute hemorrhage or significant mass effect. Unchanged severe chronic small-vessel disease and generalized volume loss. No acute hydrocephalus or extra-axial collection. Vascular: Limited evaluation due to motion artifact. Skull and upper cervical spine: Normal marrow signal. Sinuses/Orbits: Near-complete opacification of the right maxillary sinus and right ethmoid air cells. Limited evaluation of the orbits due to motion artifact. Other: None. IMPRESSION: Scattered acute infarcts in the right ACA-MCA border zone. No acute hemorrhage or significant mass effect. Electronically Signed   By: Emmit Alexanders M.D.   On: 02/04/2023 10:14   CT HEAD WO CONTRAST (5MM)  Result Date: 02/04/2023 CLINICAL DATA:  Head trauma, minor (Age >= 65y); Neck trauma (Age >= 65y) EXAM: CT HEAD WITHOUT CONTRAST CT CERVICAL SPINE WITHOUT CONTRAST TECHNIQUE: Multidetector CT imaging of the head and cervical spine was performed following the standard protocol without intravenous contrast. Multiplanar CT image reconstructions of the cervical spine were also generated. RADIATION DOSE REDUCTION: This exam was performed according to the departmental dose-optimization program which includes automated exposure control, adjustment of the mA and/or kV according to patient size and/or use of iterative reconstruction technique. COMPARISON:  MRI head 11/05/2022, CT head 11/04/2022, CT head 04/30/2021 FINDINGS: CT HEAD FINDINGS Brain: No evidence of large-territorial acute  infarction. No parenchymal hemorrhage. No mass lesion. No extra-axial collection. No mass effect or midline shift. No hydrocephalus. Basilar cisterns are patent. Vascular: No hyperdense vessel. Skull: Similar-appearing skull base osseous erosion (3:18). No acute fracture or focal lesion. Sinuses/Orbits: Almost complete opacification of the right maxillary sinus. Mucosal thickening of the left maxillary sinus. Almost complete opacification of the right ethmoid sinus. Mucosal thickening of bilateral frontal sinuses. Mucosal thickening of the sphenoid sinuses with surgical changes and associated persistent similar-appearing osseous erosion of the sphenoid sinus wall posteriorly and the clivus (5:31, 4:36).Paranasal sinuses and mastoid air cells are clear. The orbits are unremarkable. Other: None. CT CERVICAL SPINE FINDINGS Alignment: Mild retrolisthesis of C3 on C4 and C4 on C5. Skull base and vertebrae: Multilevel moderate degenerative changes of the spine with associated multilevel moderate severe osseous neural foraminal stenosis most prominent at the right C6-C7 level. No acute fracture. No aggressive appearing focal osseous lesion or focal pathologic process. Soft tissues and spinal canal: No prevertebral fluid or swelling. No visible canal hematoma. Upper chest: Unremarkable. Other: Atherosclerotic plaque of the carotid arteries within the neck and the branches off of the aortic arch. IMPRESSION: 1. No acute intracranial abnormality. 2. No acute displaced  fracture or traumatic listhesis of the cervical spine. 3. Almost complete opacification of the right maxillary and ethmoid sinuses. Mucosal thickening of the left maxillary and bilateral frontal sinuses. Mucosal thickening of the sphenoid sinuses with surgical changes and associated persistent similar-appearing osseous erosion of the sphenoid sinus wall posteriorly, skull base, and clivus. Continued outpatient MRI follow-up recommended. Electronically Signed    By: Iven Finn M.D.   On: 02/04/2023 00:02   CT Cervical Spine Wo Contrast  Result Date: 02/04/2023 CLINICAL DATA:  Head trauma, minor (Age >= 65y); Neck trauma (Age >= 65y) EXAM: CT HEAD WITHOUT CONTRAST CT CERVICAL SPINE WITHOUT CONTRAST TECHNIQUE: Multidetector CT imaging of the head and cervical spine was performed following the standard protocol without intravenous contrast. Multiplanar CT image reconstructions of the cervical spine were also generated. RADIATION DOSE REDUCTION: This exam was performed according to the departmental dose-optimization program which includes automated exposure control, adjustment of the mA and/or kV according to patient size and/or use of iterative reconstruction technique. COMPARISON:  MRI head 11/05/2022, CT head 11/04/2022, CT head 04/30/2021 FINDINGS: CT HEAD FINDINGS Brain: No evidence of large-territorial acute infarction. No parenchymal hemorrhage. No mass lesion. No extra-axial collection. No mass effect or midline shift. No hydrocephalus. Basilar cisterns are patent. Vascular: No hyperdense vessel. Skull: Similar-appearing skull base osseous erosion (3:18). No acute fracture or focal lesion. Sinuses/Orbits: Almost complete opacification of the right maxillary sinus. Mucosal thickening of the left maxillary sinus. Almost complete opacification of the right ethmoid sinus. Mucosal thickening of bilateral frontal sinuses. Mucosal thickening of the sphenoid sinuses with surgical changes and associated persistent similar-appearing osseous erosion of the sphenoid sinus wall posteriorly and the clivus (5:31, 4:36).Paranasal sinuses and mastoid air cells are clear. The orbits are unremarkable. Other: None. CT CERVICAL SPINE FINDINGS Alignment: Mild retrolisthesis of C3 on C4 and C4 on C5. Skull base and vertebrae: Multilevel moderate degenerative changes of the spine with associated multilevel moderate severe osseous neural foraminal stenosis most prominent at the right  C6-C7 level. No acute fracture. No aggressive appearing focal osseous lesion or focal pathologic process. Soft tissues and spinal canal: No prevertebral fluid or swelling. No visible canal hematoma. Upper chest: Unremarkable. Other: Atherosclerotic plaque of the carotid arteries within the neck and the branches off of the aortic arch. IMPRESSION: 1. No acute intracranial abnormality. 2. No acute displaced fracture or traumatic listhesis of the cervical spine. 3. Almost complete opacification of the right maxillary and ethmoid sinuses. Mucosal thickening of the left maxillary and bilateral frontal sinuses. Mucosal thickening of the sphenoid sinuses with surgical changes and associated persistent similar-appearing osseous erosion of the sphenoid sinus wall posteriorly, skull base, and clivus. Continued outpatient MRI follow-up recommended. Electronically Signed   By: Iven Finn M.D.   On: 02/04/2023 00:02   DG Chest Portable 1 View  Result Date: 02/02/2023 CLINICAL DATA:  Cough, confusion EXAM: PORTABLE CHEST 1 VIEW COMPARISON:  11/04/2022 FINDINGS: Lungs are clear.  No pleural effusion or pneumothorax. The heart is normal in size.  Thoracic aortic atherosclerosis. IMPRESSION: No evidence of acute cardiopulmonary disease. Electronically Signed   By: Julian Hy M.D.   On: 02/02/2023 21:40    ASSESSMENT AND PLAN:   Kenneth Franklin is a 77 y.o. male with pmh of anxiety, depression, hypertension, hyperlipidemia, alcohol use and CVA presented to ED on 02/04/2023 with acute left-sided weakness.  Further workup showed acute infarcts in right ACA-MCA border zone.  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  Lipitor was optimized to 80 mg.   - CTA head and neck showed narrowing of the right ICA with surrounding mildly enhancing soft tissue along the posterior sphenoid sinus  with associated bony destruction of the posterior wall and clivus.  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    -Radiology recommended further characterization with MRI face with and without contrast.  MRI brain performed today was heavily motion degraded due to delirium.  Patient is at high risk to have sedation to undergo MRI.  Would wait for delirium to clear up and can consider further imaging as outpatient.  Patient will also benefit from PET CT scan for restaging.  This can also be performed as outpatient.   I also spoke with Otila Kluver daughter and POA.  She is open to talk to palliative care on Monday.  I will inform Dr. B who is the primary oncologist and will arrange outpatient follow-up for further discussion of the management options.  Patient expressed understanding and was in agreement with this plan. He also understands that He can call clinic at any time with any questions, concerns, or complaints.   I spent a total of 60 minutes reviewing chart data, face-to-face evaluation with the patient, counseling and coordination of care as detailed above.  Jane Canary, MD   02/04/2023 3:37 PM

## 2023-02-04 NOTE — Evaluation (Signed)
Speech Language Pathology Evaluation Patient Details Name: CALVIN RENBERG MRN: [id number] DOB: 12-28-1945 Today's Date: 02/04/2023 Time: [id number] SLP Time Calculation (min) (ACUTE ONLY): 20 min  Problem List:  Patient Active Problem List   Diagnosis Date Noted   CVA (cerebral vascular accident) ([place]) 02/04/2023   Essential hypertension 02/04/2023   Type 2 diabetes mellitus with complication, without long-term current use of insulin ([place]) 02/04/2023   GERD without esophagitis 02/04/2023   PVD (peripheral vascular disease) ([place]) 11/05/2022   Physical deconditioning 11/05/2022   Anisocoria 11/04/2022   COVID-19 05/12/2021   Frequent falls 05/05/2021   Cognitive impairment 05/05/2021   Protein-calorie malnutrition, severe 03/09/2021   Palliative care encounter    Trigeminal neuralgia of right side of face 03/07/2021   Intractable headache 03/06/2021   HTN (hypertension) 03/06/2021   COPD (chronic obstructive pulmonary disease) ([place]) 03/06/2021   Diabetes mellitus without complication ([place]) [id number]   Stroke ([place]) 03/06/2021   Xerostomia 12/23/2020   Constipation 12/03/2020   Weight loss 12/03/2020   Malnutrition of moderate degree 10/26/2020   Painful swallowing 10/25/2020   Inadequate oral intake 10/25/2020   Mucositis due to antineoplastic therapy 10/25/2020   Mucositis due to radiation therapy 10/25/2020   Odynophagia 10/25/2020   Dehydration 10/22/2020   Nausea without vomiting 10/22/2020   Poor appetite 10/22/2020   Goals of care, counseling/discussion 09/21/2020   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   Encounter for antineoplastic chemotherapy 09/15/2020   Hypomagnesemia 09/08/2020   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   Severe recurrent major depression without psychotic features ([place]) 06/01/2017   Alcohol abuse 06/01/2017   Tylenol overdose 05/31/2017   Erroneous encounter - disregard 08/02/2016   Tobacco use disorder 06/28/2016   Tick bite of flank  06/16/2016   Grief counseling 06/16/2016   Generalized weakness 06/16/2016   Anxiety and depression 04/21/2015   Acid reflux 04/21/2015   Calcium blood increased 04/21/2015   Dyslipidemia 04/21/2015   HLD (hyperlipidemia) 04/21/2015   Past Medical History:  Past Medical History:  Diagnosis Date   Acute ischemic stroke ([place]) 2017   Alcohol abuse    Anxiety    Cancer ([place])    Depression    Diabetes mellitus without complication ([place])    Dyspnea    pcp knows and ordered rescue inhaler   Hyperlipidemia    Hypertension    Skin cancer    Squamous Cell Carcinoma In Situ   Past Surgical History:  Past Surgical History:  Procedure Laterality Date   CATARACT EXTRACTION Bilateral    COLONOSCOPY     ESOPHAGOGASTRODUODENOSCOPY (EGD) WITH PROPOFOL N/A 09/26/2022   Procedure: ESOPHAGOGASTRODUODENOSCOPY (EGD) WITH PROPOFOL;  Surgeon: Lesly Rubenstein, MD;  Location: [facility] ENDOSCOPY;  Service: Endoscopy;  Laterality: N/A;   NASOPHARYNGOSCOPY N/A 08/12/2020   Procedure: ENDOSCOPIC NASOPHARYNGOSCOPY WITH BIOPSY;  Surgeon: Margaretha Sheffield, MD;  Location: [facility] ORS;  Service: ENT;  Laterality: N/A;   PORTA CATH INSERTION N/A 08/24/2020   Procedure: PORTA CATH INSERTION;  Surgeon: Algernon Huxley, MD;  Location: [facility];  Service: Cardiovascular;  Laterality: N/A;   PORTA CATH REMOVAL N/A 07/22/2021   Procedure: PORTA CATH REMOVAL;  Surgeon: Algernon Huxley, MD;  Location: [facility];  Service: Cardiovascular;  Laterality: N/A;   HPI:  Patient is a 77 y.o. male with past medical history including nasopharyngeal cancer s/p chemo and radiation, dysphagia, alcohol abuse, CVA, DM, HTN, HLD, depression, anxiety, who presented 3/7 to ED with left sided weakness and fall; CT head was  negative and pt was discharged home. Returned 02/03/23 with worsening weakness, found to have acute CVA, possible recurrence of nasopharyngeal cancer. MRI brain 02/04/23: "Scattered acute infarcts in the right  ACA-MCA border zone. No acute  hemorrhage or significant mass effect." CT Angio 02/04/23: "1. Moderate irregular narrowing of the lacerum segment of the right  ICA with surrounding mildly enhancing soft tissue along the posterior sphenoid sinus with associated bony destruction of the posterior wall and clivus. 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 Consider further characterization with sedated MRI of the face with and  without contrast. 2. 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 3. 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 4. Aortic Atherosclerosis"   Assessment / Plan / Recommendation Clinical Impression  Patient presents with significant cognitive communication impairments, which appear to be worsened from baseline (deficits in memory reported previously). He scored 7/30 on the [place] Status (SLUMS) examination; see scoring below. He was highly distractible during the assessment; attention deficits contributed to poor performance across cognitive domains (problem solving, recall, thought organization). Anticipate need for supervision post d/c given severity of deficits and reduced safety awareness. SLP to follow during acute stay.   The "[place] Mental Status" (SLUMS) Examination was administered. Pt scored 7/30, raising concern for the presence of a neurocognitive disorder. Further testing would be beneficial, as deficits of attention, memory, oriantion, problem solving, and executive functions identified today may negatively impact pt safety with independent living.   SLUMS Examination Orientation  2/3  Numeric Problem Solving  1/3  Memory  1/5  Attention 0/2  Thought Organization 1/3  Clock Drawing 0/4  Visuospatial Skills               2/2  Short Story Recall  0/8  Total  7/30     Scoring  High School  Education  Less than High School Education   Normal  27-30 25-30  Mild Neurocognitive Disorder 21-26 20-24  Dementia  1-20 1-19       SLP Assessment  SLP Recommendation/Assessment: Patient needs continued [facility] [place] [facility] SLP Visit Diagnosis: Cognitive communication deficit (R41.841)    Recommendations for follow up therapy are one component of a multi-disciplinary discharge planning process, led by the attending physician.  Recommendations may be updated based on patient status, additional functional criteria and insurance authorization.    Follow Up Recommendations  Skilled nursing-short term rehab (<3 hours/day)    Assistance Recommended at Discharge  Frequent or constant Supervision/Assistance  Functional Status Assessment Patient has had a recent decline in their functional status and/or demonstrates limited ability to make significant improvements in function in a reasonable and predictable amount of time  Frequency and Duration min 2x/week  2 weeks      SLP Evaluation Cognition  Overall Cognitive Status: Impaired/Different from baseline Arousal/Alertness: Awake/alert Orientation Level: Oriented to person;Oriented to place Year: 2024 Day of Week: Incorrect Attention: Sustained Sustained Attention: Impaired Sustained Attention Impairment: Verbal basic (poor attention to story; difficulty with repeating numbers) Memory: Impaired Memory Impairment: Decreased recall of new information (Delayed recall of 5 objects 1/5) Awareness: Impaired Awareness Impairment: Emergent impairment (states he has trouble with his memory) Problem Solving: Impaired Problem Solving Impairment: Verbal basic (simple money problems 50% acc) Executive Function:  (impaired due to lower level deficits) Safety/Judgment: Impaired       Comprehension  Auditory Comprehension Overall  Auditory Comprehension: Appears within functional limits for tasks assessed Yes/No Questions: Within  Functional Limits Commands:  (needs repetition of complex instructions, attention vs language) Conversation: Simple Interfering Components: Attention;Processing speed;Working Field seismologist: Conservation officer, nature: Within [employer] Reading Comprehension Reading Status: Not tested    Expression Verbal Expression Overall Verbal Expression: Appears within functional limits for tasks assessed Initiation: No impairment Automatic Speech: Name;Social Response Level of Generative/Spontaneous Verbalization: Conversation Repetition: No impairment Naming:  (divergent naming 5 animals in 60 seconds (distracted)) Pragmatics: No impairment Interfering Components: Attention Non-Verbal Means of Communication: Not applicable Written Expression Dominant Hand: Right Written Expression: Not tested   Oral / Motor  Oral Motor/Sensory Function Overall Oral Motor/Sensory Function: Mild impairment Facial ROM: Within Functional Limits Facial Symmetry: Within Functional Limits Facial Strength: Within Functional Limits Facial Sensation: Within Functional Limits Lingual ROM: Reduced right Lingual Symmetry: Within Functional Limits Lingual Strength: Reduced Lingual Sensation: Within Functional Limits Velum: Within Functional Limits Mandible: Within Functional Limits Motor Speech Overall Motor Speech: Appears within functional limits for tasks assessed Respiration: Within functional limits Phonation: Wet Resonance: Within functional limits Articulation: Within functional limitis Intelligibility: Intelligible Motor Planning: Witnin functional limits Motor Speech Errors: Not applicable       Deneise Lever, [place], [place] Pathologist Office: [phone] ASCOM: [place] 02/04/2023, 12:09 PM

## 2023-02-04 NOTE — ED Provider Notes (Signed)
[facility] Provider Note    Event Date/Time   First MD Initiated Contact with Patient 02/03/23 2327     (approximate)   History   Gait Problem   HPI Level 5 caveat:  history/ROS limited by acute/critical illness  Kenneth Franklin is a 77 y.o. male who presents for evaluation of worsening left-sided limb weakness over the course of the day today.  The patient is somewhat of a vague historian but his stepdaughter is present and provides most of the history.  They report that the patient was seen in the emergency department yesterday after a fall.  The patient states that his left leg "just gave out on me".  He was evaluated and cleared to go home and at that time he was at his baseline status, ambulating with the use of a walker, with some pain to his right shoulder but otherwise with no specific complaints.  He said that he did not feel exactly right when he went to bed, and then when he woke up in the morning he says he felt like his left leg and left arm were weak.  However, his family confirms that he was still able to ambulate with a walker and they did not notice any problems.  The patient did not tell them that he did not feel well today.  However at some point at around 4 PM, the stepdaughter went over the patient's house and realized that he was not acting normal.  He was "more mean than usual".  Additionally, he was having more trouble using his left arm and his left leg.  The symptoms worsened over the next 7+ hours until he was completely unable to walk or use his arm other than just to lift it against gravity.  He has minimal use of his left hand.  At that point they brought him to the emergency department for evaluation.  He does not take blood thinners; he did in the past but he was taken off of them.  He denies fever, chest pain, shortness of breath, nausea, vomiting, and abdominal pain.  He has been needing to use the bathroom but has been constipated and  unable to go.  He has not had any burning when he urinates.  No new medications, no recent alcohol or drug use although allegedly he has a history of alcohol abuse.     Physical Exam   Triage Vital Signs: ED Triage Vitals  Enc Vitals Group     BP 02/03/23 2314 113/75     Pulse Rate 02/03/23 2314 (!) 119     Resp 02/03/23 2314 (!) 22     Temp --      Temp src --      SpO2 02/03/23 2314 95 %     Weight --      Height --      Head Circumference --      Peak Flow --      Pain Score 02/03/23 2315 5     Pain Loc --      Pain Edu? --      Excl. in [place]? --     Most recent vital signs: Vitals:   02/04/23 0152 02/04/23 0548  BP: 125/77 110/72  Pulse: 80 96  Resp: 16 18  Temp: 97.9 F (36.6 C) 98.1 F (36.7 C)  SpO2: 100% 96%     General: Awake, alert, disheveled, but not in distress. CV:  Good peripheral perfusion.  Regular  rate and rhythm, normal heart sounds. Resp:  Normal effort. Speaking easily and comfortably, no accessory muscle usage nor intercostal retractions.  Lungs are clear to auscultation bilaterally. Abd:  No distention.  No tenderness to palpation of the abdomen.  No abdominal pulsatile masses. Neuro:  No facial droop.  Pupils are equal and reactive.  No dysarthria nor aphasia.  No obvious confusion.  Patient is able to lift his left arm briefly against gravity but then it falls back down.  He has a decreased grip strength on the left but otherwise normal use of his left hand.  Similarly, he is able to hold his left leg up against gravity slightly longer than the left arm but it falls back to the bed.  He is able to plantarflex and dorsiflex his foot but the strength is decreased.  His arm and leg strength on the right side are essentially normal with no significant abnormalities noted. NIHSS 4.   ED Results / Procedures / Treatments   Labs (all labs ordered are listed, but only abnormal results are displayed) Labs Reviewed  CBC WITH DIFFERENTIAL/PLATELET -  Abnormal; Notable for the following components:      Result Value   WBC 11.4 (*)    Platelets 415 (*)    Neutro Abs 9.3 (*)    All other components within normal limits  COMPREHENSIVE METABOLIC PANEL - Abnormal; Notable for the following components:   Sodium 134 (*)    Chloride 97 (*)    Glucose, Bld 257 (*)    Alkaline Phosphatase 174 (*)    All other components within normal limits  ETHANOL  PROTIME-INR  URINALYSIS, ROUTINE W REFLEX MICROSCOPIC  URINE DRUG SCREEN, QUALITATIVE ([facility] ONLY)  LIPID PANEL  BASIC METABOLIC PANEL  CBC  HEMOGLOBIN A1C  TROPONIN I (HIGH SENSITIVITY)     EKG  ED ECG REPORT I, Hinda Kehr, the attending physician, personally viewed and interpreted this ECG.  Date: 02/03/2023 EKG Time: 23: 21 Rate: 121 Rhythm: Sinus tachycardia QRS Axis: normal Intervals: normal ST/T Wave abnormalities: Non-specific ST segment / T-wave changes, but no clear evidence of acute ischemia. Narrative Interpretation: no definitive evidence of acute ischemia; does not meet STEMI criteria.    RADIOLOGY See hospital course for details: No acute abnormalities on head and cervical spine CTs.    PROCEDURES:  Critical Care performed: Yes, see critical care procedure note(s)  .Critical Care  Performed by: Hinda Kehr, MD Authorized by: Hinda Kehr, MD   Critical care provider statement:    Critical care time (minutes):  45   Critical care time was exclusive of:  Separately billable procedures and treating other patients   Critical care was necessary to treat or prevent imminent or life-threatening deterioration of the following conditions:  CNS failure or compromise   Critical care was time spent personally by me on the following activities:  Development of treatment plan with patient or surrogate, evaluation of patient's response to treatment, examination of patient, obtaining history from patient or surrogate, ordering and performing treatments and  interventions, ordering and review of laboratory studies, ordering and review of radiographic studies, pulse oximetry, re-evaluation of patient's condition and review of old charts    MEDICATIONS ORDERED IN ED: Medications  aspirin EC tablet 81 mg (has no administration in time range)  oxyCODONE (Oxy IR/ROXICODONE) immediate release tablet 5 mg (5 mg Oral Given 02/04/23 0351)  DULoxetine (CYMBALTA) DR capsule 30 mg (has no administration in time range)  glipiZIDE (GLUCOTROL) tablet 5 mg (  has no administration in time range)  famotidine (PEPCID) tablet 40 mg (has no administration in time range)  linaclotide (LINZESS) capsule 72 mcg (has no administration in time range)  pantoprazole (PROTONIX) EC tablet 40 mg (has no administration in time range)  clopidogrel (PLAVIX) tablet 75 mg (has no administration in time range)  folic acid (FOLVITE) tablet 1 mg (has no administration in time range)  cholecalciferol (VITAMIN D3) 25 MCG (1000 UNIT) tablet 2,000 Units (has no administration in time range)  multivitamin with minerals tablet 1 tablet (has no administration in time range)  albuterol (PROVENTIL) (2.5 MG/3ML) 0.083% nebulizer solution 2.5 mg (has no administration in time range)   stroke: early stages of recovery book (has no administration in time range)  enoxaparin (LOVENOX) injection 40 mg (has no administration in time range)  0.9 %  sodium chloride infusion ( Intravenous New Bag/Given 02/04/23 0349)  acetaminophen (TYLENOL) tablet 650 mg (has no administration in time range)    Or  acetaminophen (TYLENOL) suppository 650 mg (has no administration in time range)  traZODone (DESYREL) tablet 25 mg (25 mg Oral Given 02/04/23 0351)  magnesium hydroxide (MILK OF MAGNESIA) suspension 30 mL (has no administration in time range)  ondansetron (ZOFRAN) tablet 4 mg (has no administration in time range)    Or  ondansetron (ZOFRAN) injection 4 mg (has no administration in time range)  alum & mag  hydroxide-simeth (MAALOX/MYLANTA) 200-200-20 MG/5ML suspension 30 mL (30 mLs Oral Given 02/04/23 0236)  insulin aspart (novoLOG) injection 0-5 Units (has no administration in time range)  insulin aspart (novoLOG) injection 0-15 Units (has no administration in time range)  nicotine (NICODERM CQ - dosed in mg/24 hours) patch 14 mg (has no administration in time range)  cefTRIAXone (ROCEPHIN) 1 g in sodium chloride 0.9 % 100 mL IVPB (has no administration in time range)  potassium chloride 10 mEq in 100 mL IVPB (has no administration in time range)  aspirin chewable tablet 324 mg (324 mg Oral Given 02/04/23 0050)     IMPRESSION / MDM / ASSESSMENT AND PLAN / ED COURSE  I reviewed the triage vital signs and the nursing notes.                              Differential diagnosis includes, but is not limited to, acute head bleed, CVA, TIA, metabolic or electrolyte abnormality, psychiatric illness.  Patient's presentation is most consistent with acute presentation with potential threat to life or bodily function.  Labs/studies ordered: Head CT without contrast, cervical spine CT without contrast, CMP, CBC with differential, urinalysis, urine drug screen, high-sensitivity troponin, pro time-INR Interventions/Medications given: Aspirin 324 mg p.o., [facility] Course my include additional interventions or labs/studies not listed above.)  Strongly suspect head bleed versus CVA.  He is not reporting pain in his head but given his recent fall I will rule it out with a noncontrast head CT.  He is well outside the window for intervention for CVA given that the symptoms likely started more than 24 hours ago when he initially fell after his left leg "gave out on him".  Regardless, if the symptoms just started this afternoon, it has still been approximately 7-8 hours, and he has no signs of LVO, which also means there is no benefit to calling a code stroke at this time.  I explained this to the patient and his  stepdaughter and they understand.  He is not a good candidate for  tPA given that he is outside the window.  The patient is on the cardiac monitor to evaluate for evidence of arrhythmia and/or significant heart rate changes.   Clinical Course as of 02/04/23 0559  Sat Feb 04, 2023  0005 I viewed and interpreted the patient's head CT and cervical spine CT.  I see no evidence of acute intracranial bleed, nor any gross or acute changes on the cervical spine.  The radiologist mentioned that there is opacification of the right maxillary sinus and that the patient should continue to follow-up as an outpatient with MRIs.  I looked back through his medical record and I see that he apparently had nasopharyngeal carcinoma status post treatment.  I see that he last had an MR of the head without contrast about 3 months ago.  Given the strong probability of a subacute stroke, the patient needs to be admitted for further evaluation.  He is VAN negative with no vision changes, aphasia, nor neglect, and the probability of an LVO that would be retrievable is very limited.  Additionally, it is likely that the symptoms started more than 24 hours ago given that he said he originally came in for his fall yesterday after his left leg gave out.  At this point there is little to do other than further investigation as an inpatient.  I ordered aspirin 324 mg p.o. after he passed the stroke swallow screen.  I am consulting the hospitalist team for admission.  I also discussed the results with the patient's stepdaughter and with the patient himself and they understand and agree with the plan. [CF]  [id number] All of the patient's labs are essentially normal.  There is very slight hyponatremia which is likely not clinically relevant.  The nurse is sending additional tubes for coags and ethanol level.  Patient has not yet provided a urine specimen. [CF]  0053 Consulted the hospitalist service (Dr. Sidney Ace).  We discussed the case in detail  and he will admit the patient. [CF]    Clinical Course User Index [CF] Hinda Kehr, MD     FINAL CLINICAL IMPRESSION(S) / ED DIAGNOSES   Final diagnoses:  Cerebrovascular accident (CVA), unspecified mechanism ([place])  Acute left hemiparesis ([place])     Rx / DC Orders   ED Discharge Orders     None        Note:  This document was prepared using Dragon voice recognition software and may include unintentional dictation errors.   Hinda Kehr, MD 02/04/23 [phone]

## 2023-02-04 NOTE — Progress Notes (Signed)
PT Cancellation Note  Patient Details Name: Kenneth Franklin MRN: [id number] DOB: November 24, 1946   Cancelled Treatment:    Reason Eval/Treat Not Completed: Fatigue/lethargy limiting ability to participate (chart reviewed, eval attempted x3.) Pt getting echo, then asleep, then at 1420 still asleep, able to wake somewhat, but remains quite drowsy, appears exhausted, is not agreeable to OOB for ADL or assessment, just feels wiped out. Will continue to follow and attempt again next day.   2:26 PM, 02/04/23 Etta Grandchild, PT, DPT Physical Therapist - [facility]  [phone] ([place])     Dwale C 02/04/2023, 2:25 PM

## 2023-02-05 LAB — COMPREHENSIVE METABOLIC PANEL
ALT: 8 U/L (ref 0–44)
AST: 13 U/L — ABNORMAL LOW (ref 15–41)
Albumin: 3 g/dL — ABNORMAL LOW (ref 3.5–5.0)
Alkaline Phosphatase: 145 U/L — ABNORMAL HIGH (ref 38–126)
Anion gap: 6 (ref 5–15)
BUN: 6 mg/dL — ABNORMAL LOW (ref 8–23)
CO2: 22 mmol/L (ref 22–32)
Calcium: 8.3 mg/dL — ABNORMAL LOW (ref 8.9–10.3)
Chloride: 109 mmol/L (ref 98–111)
Creatinine, Ser: 0.61 mg/dL (ref 0.61–1.24)
GFR, Estimated: >60 mL/min (ref >60)
Glucose, Bld: 123 mg/dL — ABNORMAL HIGH (ref 70–99)
Potassium: 4.2 mmol/L (ref 3.5–5.1)
Sodium: 137 mmol/L (ref 135–145)
Total Bilirubin: 0.5 mg/dL (ref 0.3–1.2)
Total Protein: 6.1 g/dL — ABNORMAL LOW (ref 6.5–8.1)

## 2023-02-05 LAB — C-REACTIVE PROTEIN: CRP: 3.8 mg/dL — ABNORMAL HIGH (ref <1.0)

## 2023-02-05 LAB — PHOSPHORUS: Phosphorus: 2.6 mg/dL (ref 2.5–4.6)

## 2023-02-05 LAB — GLUCOSE, CAPILLARY
Glucose-Capillary: 137 mg/dL — ABNORMAL HIGH (ref 70–99)
Glucose-Capillary: 118 mg/dL — ABNORMAL HIGH (ref 70–99)
Glucose-Capillary: 128 mg/dL — ABNORMAL HIGH (ref 70–99)
Glucose-Capillary: 111 mg/dL — ABNORMAL HIGH (ref 70–99)
Glucose-Capillary: 98 mg/dL (ref 70–99)

## 2023-02-05 LAB — CBC
HCT: 35.7 % — ABNORMAL LOW (ref 39.0–52.0)
Hemoglobin: 11.4 g/dL — ABNORMAL LOW (ref 13.0–17.0)
MCH: 27.7 pg (ref 26.0–34.0)
MCHC: 31.9 g/dL (ref 30.0–36.0)
MCV: 86.9 fL (ref 80.0–100.0)
Platelets: 278 10*3/uL (ref 150–400)
RBC: 4.11 MIL/uL — ABNORMAL LOW (ref 4.22–5.81)
RDW: 13.8 % (ref 11.5–15.5)
WBC: 10.6 10*3/uL — ABNORMAL HIGH (ref 4.0–10.5)
nRBC: 0 % (ref 0.0–0.2)

## 2023-02-05 LAB — MAGNESIUM: Magnesium: 2.1 mg/dL (ref 1.7–2.4)

## 2023-02-05 MED ORDER — FLUTICASONE FUROATE-VILANTEROL 200-25 MCG/ACT IN AEPB
1.0000 | INHALATION_SPRAY | Freq: Every day | RESPIRATORY_TRACT | Status: DC
  Administered 2023-02-05 – 2023-02-09 (×4): 1 via RESPIRATORY_TRACT
  Filled 2023-02-05: qty 28

## 2023-02-05 NOTE — Progress Notes (Signed)
Speech Language Pathology Treatment:    Patient Details Name: Kenneth Franklin MRN: [id number] DOB: 11-Jul-1946 Today's Date: 02/05/2023 Time: [id number] SLP Time Calculation (min) (ACUTE ONLY): 15 min  Assessment / Plan / Recommendation Clinical Impression  Pt seen for clinical swallowing evaluation. Pt alert. Confusion and confabulation noted (e.g. "get my cigarettes from the living room). Blankets and gown disheveled upon SLP entrance to room. Pt endorsed he was "hot." Pt on room air. Congested and occasionally productive cough noted yielding a mild amount of thick yellow sputum. Cleared with RN.   Pt given trials of ice chips (x5 via tsp) and nectar-thick/mildly thick liquids (x2 via tsp). Oral phase was functional with trials; however, concern for pharyngeal dysphagia given immediate, wet cough with trials. Cough intermittently prolonged in duration.  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 Recommend strict NPO with oral care by nursing staff in the interim.   Pt and RN made aware of results, recommendations, and SLP POC. ?full understanding by pt given cognitive status. Reinforcement of content likely needed.    HPI HPI: Patient is a 77 y.o. male with past medical history including nasopharyngeal cancer s/p chemo and radiation, dysphagia, alcohol abuse, CVA, DM, HTN, HLD, depression, anxiety, who presented 3/7 to ED with left sided weakness and fall; CT head was negative and pt was discharged home. Returned 02/03/23 with worsening weakness, found to have acute CVA, possible recurrence of nasopharyngeal cancer. MRI brain 02/04/23: "Scattered acute infarcts in the right ACA-MCA border zone. No acute  hemorrhage or significant mass effect." CT Angio 02/04/23: "1. Moderate irregular narrowing of the lacerum segment of the right  ICA with surrounding mildly enhancing soft tissue along the  posterior sphenoid sinus with associated bony destruction of the posterior wall and clivus. 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 Consider further characterization with sedated MRI of the face with and  without contrast. 2. 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 3. 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 4. Aortic Atherosclerosis"      SLP Plan  MBS (tentatively planned for 02/06/23)      Recommendations for follow up therapy are one component of a multi-disciplinary discharge planning process, led by the attending physician.  Recommendations may be updated based on patient status, additional functional criteria and insurance authorization.    Recommendations  Diet recommendations: NPO Medication Administration: Via alternative means                Oral Care Recommendations: Oral care QID;Staff/trained caregiver to provide oral care Follow Up Recommendations: Skilled nursing-short term rehab (<3 hours/day) Assistance recommended at discharge: Frequent or constant Supervision/Assistance SLP Visit Diagnosis: Cognitive communication deficit (R41.841);Dysphagia, pharyngeal phase (R13.13) Plan: MBS (tentatively planned for 02/06/23)          Cherrie Gauze, M.S., [facility] [phone] [name]  02/05/2023, 9:57 AM

## 2023-02-05 NOTE — Evaluation (Signed)
Physical Therapy Evaluation Patient Details Name: Kenneth Franklin MRN: [id number] DOB: [birth year] Today's Date: 02/05/2023  History of Present Illness  Kenneth Franklin is a 40 Patient is a 77 y.o. male with past medical history including nasopharyngeal cancer s/p chemo and radiation, dysphagia, alcohol abuse, CVA, DM, HTN, HLD, depression, anxiety, who presented 3/7 to ED with left sided weakness and fall; CT head was negative and pt was discharged home. Returned 02/03/23 with worsening weakness, found to have acute CVA, possible recurrence of nasopharyngeal cancer. MRI brain 02/04/23: "Scattered acute infarcts in the right ACA-MCA border zone. No acute  hemorrhage or significant mass effect." CT Angio 02/04/23: "1. Moderate irregular narrowing of the lacerum segment of the right  ICA with surrounding mildly enhancing soft tissue along the posterior sphenoid sinus with associated bony destruction of the posterior wall and clivus. 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 Chest pain with sitting and exertion. Pt lethargic but agreeable to participate to sit up on the EOB and refused to walk to sit in the chair. Pt PLOF is living alone with help. Pt appears congested with yellowish green productive cough. PT assessment revealed Mod assist for bed mobility and mod ot max assist for STS with poor postural control in sitting  and standing. Pt demonstrates LUE weakness and severe generalized weakness. Pt demonstrated  agitation due to not feeling well. Pt able stand at the bed side for 10 secs only. Pt's activity participate limited 2/2 to pain, weakness and lethargy. PT will continue in acute and will refer pt to Mobility specialists while in acute. Pt will benefit from SNF to improve strength, balance and  functional mobility in order to return to PLOF.        Recommendations for follow up  therapy are one component of a multi-disciplinary discharge planning process, led by the attending physician.  Recommendations may be updated based on patient status, additional functional criteria and insurance authorization.  Follow Up Recommendations Skilled nursing-short term rehab (<3 hours/day) Can patient physically be transported by private vehicle: No    Assistance Recommended at Discharge Frequent or constant Supervision/Assistance  Patient can return home with the following  Two people to help with walking and/or transfers;Two people to help with bathing/dressing/bathroom;Direct supervision/assist for medications management;Direct supervision/assist for financial management;Assistance with cooking/housework;Assistance with feeding    Equipment Recommendations None recommended by PT  Recommendations for Other Services       Functional Status Assessment Patient has had a recent decline in their functional status and demonstrates the ability to make significant improvements in function in a reasonable and predictable amount of time.     Precautions / Restrictions Precautions Precautions: Fall Restrictions Weight Bearing Restrictions: No      Mobility  Bed Mobility Overal bed mobility: Needs Assistance Bed Mobility: Supine to Sit, Sit to Supine     Supine to sit: Min assist Sit to supine: Min assist   General bed mobility comments: pt wants to lay down immediately    Transfers Overall transfer level: Needs assistance Equipment used: 1 person hand held assist Transfers: Sit to/from Stand Sit to Stand: Mod assist           General transfer comment: Poor posutral control with unsteadinees and need to be seated.    Ambulation/Gait: refused and unsafe               General  Gait Details: unsafe and pt refused  Stairs: unsafe/deferred            Wheelchair Mobility    Modified Rankin (Stroke Patients Only)       Balance Overall balance  assessment: Needs assistance Sitting-balance support: Bilateral upper extremity supported, Single extremity supported Sitting balance-Leahy Scale: Poor Sitting balance - Comments: Pt unable ot sit with good posutre. Severly forward felxed with flexed neck.   Standing balance support: Single extremity supported Standing balance-Leahy Scale: Poor Standing balance comment: weak and pain causing unsteadiness and poor tolerance                             Pertinent Vitals/Pain Pain Assessment Pain Assessment: Faces Pain Score: 7  Body Language: tense, distressed pacing, fidgeting Pain Location: R shoulder and chest. Pain Intervention(s): Monitored during session, Patient requesting pain meds-RN notified    Home Living Family/patient expects to be discharged to:: Private residence Living Arrangements: Alone Available Help at Discharge: Family;Personal care attendant Type of Home: House Home Access: Ramped entrance       Home Layout: One level Home Equipment: Cane - single point;BSC/3in1;Grab bars - tub/shower;Rollator (4 wheels) Additional Comments: Pt very upset and wants to rest 2/2 to not feeling well. COngested and in pain.    Prior Function Prior Level of Function : Independent/Modified Independent             Mobility Comments: Per daughter, pt not very active but does perform HH mobility independently. ADLs Comments: as per information per OT: Requires encouragement to perform ADL management, but is able to complete bathing and dressing tasks without assist from family/PCA. Has had a steady decline with recent stark decrease in functional ability per daughter.     Hand Dominance   Dominant Hand: Right    Extremity/Trunk Assessment   Upper Extremity Assessment Upper Extremity Assessment: Defer to OT evaluation    Lower Extremity Assessment Lower Extremity Assessment: Generalized weakness;LLE deficits/detail LLE Deficits / Details: Weak LLE Sensation:  decreased light touch LLE Coordination: decreased gross motor       Communication   Communication: HOH  Cognition Arousal/Alertness: Lethargic Behavior During Therapy: Restless, Flat affect, Agitated Overall Cognitive Status: Impaired/Different from baseline Area of Impairment: Following commands, Orientation                 Orientation Level: Disoriented to, Place, Time, Situation     Following Commands: Follows one step commands consistently Safety/Judgement: Decreased awareness of safety     General Comments: Lethargic and difficult to particapte 2/2 top pain        General Comments      Exercises     Assessment/Plan    PT Assessment Patient needs continued PT services  PT Problem List Decreased strength;Decreased activity tolerance;Decreased balance;Decreased mobility;Decreased coordination;Decreased cognition;Decreased knowledge of use of DME;Decreased safety awareness;Pain       PT Treatment Interventions Gait training;Functional mobility training;Therapeutic activities;Therapeutic exercise;Stair training;Balance training;Neuromuscular re-education;Cognitive remediation;Patient/family education    PT Goals (Current goals can be found in the Care Plan section)  Acute Rehab PT Goals Patient Stated Goal: Unable PT Goal Formulation: Patient unable to participate in goal setting Time For Goal Achievement: 02/19/23 Potential to Achieve Goals: Fair    Frequency Min 2X/week     Co-evaluation               AM-PAC PT "6 Clicks" Mobility  Outcome Measure Help needed turning from your back  to your side while in a flat bed without using bedrails?: A Little Help needed moving from lying on your back to sitting on the side of a flat bed without using bedrails?: A Little Help needed moving to and from a bed to a chair (including a wheelchair)?: A Lot Help needed standing up from a chair using your arms (e.g., wheelchair or bedside chair)?: A Lot Help needed  to walk in hospital room?: A Lot Help needed climbing 3-5 steps with a railing? : Total 6 Click Score: 13    End of Session Equipment Utilized During Treatment: Gait belt Activity Tolerance: Patient limited by lethargy;Treatment limited secondary to agitation Patient left: in bed;with bed alarm set Nurse Communication: Mobility status PT Visit Diagnosis: Unsteadiness on feet (R26.81);Other abnormalities of gait and mobility (R26.89);Muscle weakness (generalized) (M62.81);Difficulty in walking, not elsewhere classified (R26.2);Other symptoms and signs involving the nervous system (R29.898);Pain Pain - Right/Left: Right Pain - part of body: Shoulder (chest)    Time: [id number] PT Time Calculation (min) (ACUTE ONLY): 23 min   Charges:   PT Evaluation $PT Eval Moderate Complexity: 1 Mod PT Treatments $Therapeutic Activity: 8-22 mins       Joaquin Music PT DPT 12:47 PM,02/05/23

## 2023-02-05 NOTE — Progress Notes (Signed)
  Progress Note   Patient: Kenneth Franklin [id number] DOB: 24-Jul-1946 DOA: 02/03/2023     1 DOS: the patient was seen and examined on 02/05/2023   Brief hospital course:  Assessment and Plan: * CVA (cerebral vascular accident) [facility]) - Neurology consult appreciated  - ASA 81 mg PO daily  - Plavix 75 mg PO daily  - Lipitor 80 mg PO daily  - IV ceftriaxone 1 g daily to cover for any possible aspiration  - Appreciate oncology assistance/consultation 02/04/2023   Essential hypertension - Allow permissive HTN    Type 2 diabetes mellitus with complication, without long-term current use of insulin (HCC) - Glipizide 5 mg PO daily  - Novolog SS ACHS    COPD (chronic obstructive pulmonary disease) (HCC) - Albuterol q4 hr PRN    Dyslipidemia - Cymbalta as above    Anxiety and depression - Cymbalta 30 mg PO daily    GERD without esophagitis - IV protonix 40 mg daily    Hypokalemia  - Resolved    DVT prophylaxis: Lovenox 40 mg sq daily  GI prophylaxis: Protonix 40 mg IV daily       Subjective: Pt seen and examined at the bedside. He seems to be more calm and relaxed today. WBC is downtrending 11.7 -->10.6. IV fluids stopped as K+ is now wnl.  Physical Exam: Vitals:   02/04/23 2025 02/04/23 2311 02/05/23 0527 02/05/23 0756  BP: (!) 144/78 (!) 166/76 (!) 153/79 (!) 156/80  Pulse: 80 87 85 88  Resp: 18 18 16 16   Temp: 97.8 F (36.6 C) 98.1 F (36.7 C) 98.1 F (36.7 C) 99.1 F (37.3 C)  TempSrc:      SpO2: 100% 94% 95% 94%  Weight:      Height:       Constitutional:      Comments: Awake and able to answer simple questions  HENT:     Head: Normocephalic.  Cardiovascular:     Rate and Rhythm: Normal rate and regular rhythm.  Pulmonary:     Effort: Pulmonary effort is normal.  Abdominal:     General: Abdomen is flat.     Palpations: Abdomen is soft.  Musculoskeletal:        General: Normal range of motion.     Cervical back: Neck supple.  Skin:    General: Skin is  warm.  Neurological:     Mental Status: Calm Psychiatric:     Comments: Mood neutral   Data Reviewed:   Disposition: Status is: Inpatient  Planned Discharge Destination: Barriers to discharge: Oncology and palliative discussion     Time spent: 35 minutes  Author: Lucienne Minks , MD 02/05/2023 9:51 AM  For on call review [email or web address].

## 2023-02-05 NOTE — Plan of Care (Signed)
  Problem: Education: Goal: Knowledge of disease or condition will improve Outcome: Progressing Goal: Knowledge of secondary prevention will improve (MUST DOCUMENT ALL) Outcome: Progressing Goal: Knowledge of patient specific risk factors will improve Elta [place] N/A or DELETE if not current risk factor) Outcome: Progressing   Problem: Ischemic Stroke/TIA Tissue Perfusion: Goal: Complications of ischemic stroke/TIA will be minimized Outcome: Progressing   Problem: Coping: Goal: Will verbalize positive feelings about self Outcome: Progressing   Problem: Self-Care: Goal: Ability to participate in self-care as condition permits will improve Outcome: Progressing

## 2023-02-05 NOTE — TOC Initial Note (Signed)
Transition of Care [facility]) - Initial/Assessment Note    Patient Details  Name: Kenneth Franklin MRN: [id number] Date of Birth: June 13, 1946  Transition of Care [facility]) CM/SW Contact:    Gerilyn Pilgrim, LCSW Phone Number: 02/05/2023, 11:49 AM  Clinical Narrative:  CSW spoke with patients POA Ernestina Patches regarding SNF recommendations. Otila Kluver states she is agreeable to this but states she does not want patient going to [facility]. Otila Kluver would like TOC to speak with patient about this. TOC explained by the notes it did not appear the pt was fully oriented. Otila Kluver reports she feels that maybe was an error and would like TOC to follow up with pt. TOC spoke with patient and pt is not fully oriented as he believed he was currently at home. CSW will start SNF workup for patient. Pt to have MBS tomorrow.                          Patient Goals and CMS Choice            Expected Discharge Plan and Services                                              Prior Living Arrangements/Services                       Activities of Daily Living      Permission Sought/Granted                  Emotional Assessment              Admission diagnosis:  CVA (cerebral vascular accident) ([place]) [I63.9] Acute left hemiparesis ([place]) [G81.94] Cerebrovascular accident (CVA), unspecified mechanism ([place]) [I63.9] Patient Active Problem List   Diagnosis Date Noted   CVA (cerebral vascular accident) ([place]) 02/04/2023   Essential hypertension 02/04/2023   Type 2 diabetes mellitus with complication, without long-term current use of insulin ([place]) 02/04/2023   GERD without esophagitis 02/04/2023   Pressure injury of skin 02/04/2023   PVD (peripheral vascular disease) ([place]) 11/05/2022   Physical deconditioning 11/05/2022   Anisocoria 11/04/2022   COVID-19 05/12/2021   Frequent falls 05/05/2021   Cognitive impairment 05/05/2021   Protein-calorie malnutrition, severe 03/09/2021   Palliative care  encounter    Trigeminal neuralgia of right side of face 03/07/2021   Intractable headache 03/06/2021   HTN (hypertension) 03/06/2021   COPD (chronic obstructive pulmonary disease) ([place]) 03/06/2021   Diabetes mellitus without complication ([place]) [id number]   Stroke ([place]) 03/06/2021   Xerostomia 12/23/2020   Constipation 12/03/2020   Weight loss 12/03/2020   Malnutrition of moderate degree 10/26/2020   Painful swallowing 10/25/2020   Inadequate oral intake 10/25/2020   Mucositis due to antineoplastic therapy 10/25/2020   Mucositis due to radiation therapy 10/25/2020   Odynophagia 10/25/2020   Dehydration 10/22/2020   Nausea without vomiting 10/22/2020   Poor appetite 10/22/2020   Goals of care, counseling/discussion 09/21/2020   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   Encounter for antineoplastic chemotherapy 09/15/2020   Hypomagnesemia 09/08/2020   Nasopharyngeal cancer ([place]) 08/18/2020   Severe recurrent major depression without psychotic features ([place]) 06/01/2017   Alcohol abuse 06/01/2017   Tylenol overdose 05/31/2017   Erroneous encounter - disregard 08/02/2016   Tobacco use disorder 06/28/2016   Tick bite of flank 06/16/2016  Grief counseling 06/16/2016   Generalized weakness 06/16/2016   Anxiety and depression 04/21/2015   Acid reflux 04/21/2015   Calcium blood increased 04/21/2015   Dyslipidemia 04/21/2015   HLD (hyperlipidemia) 04/21/2015   PCP:  Verita Lamb, NP Pharmacy:   [id number] [id number]: [phone]: [phone] [id number] [address]: [phone]: [phone]   Social Determinants of Health (SDOH) Social History: SDOH Screenings   Tobacco Use: High Risk (02/03/2023)   SDOH Interventions:     Readmission Risk Interventions    10/27/2020    1:28 PM  Readmission Risk  Prevention Plan  Transportation Screening Complete  PCP or Specialist Appt within 3-5 Days Complete  HRI or [place]  Social Work Consult for [place]/Counseling Complete  Palliative Care Screening Not Applicable  Medication Review (RPress photographerComplete

## 2023-02-05 NOTE — NC FL2 (Addendum)
[place] LEVEL OF CARE FORM     IDENTIFICATION  Patient Name: Kenneth Franklin Birthdate: 11-15-1946 Sex: male Admission Date (Current Location): 02/03/2023  [facility] and [place] Number:  Engineering geologist and Address:  [facility], [address] [place], [place] [address]      Provider Number: [id number]  Attending Physician Name and Address:  Lucienne Minks, MD  Relative Name and Phone Number:  Margorie John (Daughter) [phone]    Current Level of Care: Hospital Recommended Level of Care: [place] Prior Approval Number:    Date Approved/Denied:   PASRR Number: [id number] Discharge Plan: SNF    Current Diagnoses: Patient Active Problem List   Diagnosis Date Noted   CVA (cerebral vascular accident) ([place]) 02/04/2023   Essential hypertension 02/04/2023   Type 2 diabetes mellitus with complication, without long-term current use of insulin ([place]) 02/04/2023   GERD without esophagitis 02/04/2023   Pressure injury of skin 02/04/2023   PVD (peripheral vascular disease) ([place]) 11/05/2022   Physical deconditioning 11/05/2022   Anisocoria 11/04/2022   COVID-19 05/12/2021   Frequent falls 05/05/2021   Cognitive impairment 05/05/2021   Protein-calorie malnutrition, severe 03/09/2021   Palliative care encounter    Trigeminal neuralgia of right side of face 03/07/2021   Intractable headache 03/06/2021   HTN (hypertension) 03/06/2021   COPD (chronic obstructive pulmonary disease) ([place]) 03/06/2021   Diabetes mellitus without complication ([place]) [id number]   Stroke ([place]) 03/06/2021   Xerostomia 12/23/2020   Constipation 12/03/2020   Weight loss 12/03/2020   Malnutrition of moderate degree 10/26/2020   Painful swallowing 10/25/2020   Inadequate oral intake 10/25/2020   Mucositis due to antineoplastic therapy 10/25/2020   Mucositis due to radiation therapy 10/25/2020   Odynophagia 10/25/2020   Dehydration 10/22/2020    Nausea without vomiting 10/22/2020   Poor appetite 10/22/2020   Goals of care, counseling/discussion 09/21/2020   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   Encounter for antineoplastic chemotherapy 09/15/2020   Hypomagnesemia 09/08/2020   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   Severe recurrent major depression without psychotic features ([place]) 06/01/2017   Alcohol abuse 06/01/2017   Tylenol overdose 05/31/2017   Erroneous encounter - disregard 08/02/2016   Tobacco use disorder 06/28/2016   Tick bite of flank 06/16/2016   Grief counseling 06/16/2016   Generalized weakness 06/16/2016   Anxiety and depression 04/21/2015   Acid reflux 04/21/2015   Calcium blood increased 04/21/2015   Dyslipidemia 04/21/2015   HLD (hyperlipidemia) 04/21/2015    Orientation RESPIRATION BLADDER Height & Weight     Self, Time  Normal Incontinent Weight: 113 lb 12.1 oz (51.6 kg) Height:  6' (182.9 cm)  BEHAVIORAL SYMPTOMS/MOOD NEUROLOGICAL BOWEL NUTRITION STATUS      Incontinent    AMBULATORY STATUS COMMUNICATION OF NEEDS Skin   Limited Assist Verbally Normal (redness to arm, wright and bilateral legs, stage 3 medial sacral wound foam dressing change daily)                       Personal Care Assistance Level of Assistance  Bathing, Feeding, Dressing Bathing Assistance: Limited assistance Feeding assistance: Limited assistance Dressing Assistance: Maximum assistance     Functional Limitations Info  Sight, Hearing, Speech Sight Info: Adequate Hearing Info: Adequate Speech Info: Adequate    SPECIAL CARE FACTORS FREQUENCY  PT (By licensed PT), OT (By licensed OT)     PT Frequency: 5 times a week OT Frequency: 5 times a week  Contractures Contractures Info: Not present    Additional Factors Info  Code Status, Allergies Code Status Info: FULL Allergies Info: Propoxyphene           Current Medications (02/05/2023):  This is the current hospital active medication  list Current Facility-Administered Medications  Medication Dose Route Frequency Provider Last Rate Last Admin   acetaminophen (TYLENOL) tablet 650 mg  650 mg Oral Q6H PRN Mansy, Jan A, MD   650 mg at 02/04/23 1024   Or   acetaminophen (TYLENOL) suppository 650 mg  650 mg Rectal Q6H PRN Mansy, Jan A, MD       albuterol (PROVENTIL) (2.5 MG/3ML) 0.083% nebulizer solution 2.5 mg  2.5 mg Nebulization Q4H PRN Mansy, Jan A, MD       alum & mag hydroxide-simeth (MAALOX/MYLANTA) 200-200-20 MG/5ML suspension 30 mL  30 mL Oral Q4H PRN Mansy, Jan A, MD   30 mL at 02/04/23 0236   aspirin EC tablet 81 mg  81 mg Oral Daily Mansy, Jan A, MD   81 mg at 02/04/23 1024   atorvastatin (LIPITOR) tablet 80 mg  80 mg Oral QHS Bhagat, Srishti L, MD       cefTRIAXone (ROCEPHIN) 1 g in sodium chloride 0.9 % 100 mL IVPB  1 g Intravenous Q24H Lucienne Minks, MD 200 mL/hr at 02/05/23 0534 1 g at 02/05/23 0534   cholecalciferol (VITAMIN D3) 25 MCG (1000 UNIT) tablet 2,000 Units  2,000 Units Oral Daily Mansy, Jan A, MD   2,000 Units at 02/04/23 1024   clopidogrel (PLAVIX) tablet 75 mg  75 mg Oral q morning Mansy, Jan A, MD   75 mg at 02/04/23 1024   DULoxetine (CYMBALTA) DR capsule 30 mg  30 mg Oral Daily Mansy, Jan A, MD   30 mg at 02/04/23 1024   enoxaparin (LOVENOX) injection 40 mg  40 mg Subcutaneous Q24H Mansy, Jan A, MD   40 mg at 02/05/23 0954   fluticasone furoate-vilanterol (BREO ELLIPTA) 200-25 MCG/ACT 1 puff  1 puff Inhalation Daily Lucienne Minks, MD   1 puff at [id number]   folic acid (FOLVITE) tablet 1 mg  1 mg Oral Daily Mansy, Jan A, MD   1 mg at 02/04/23 1024   glipiZIDE (GLUCOTROL) tablet 5 mg  5 mg Oral Daily Mansy, Jan A, MD   5 mg at 02/04/23 1024   insulin aspart (novoLOG) injection 0-15 Units  0-15 Units Subcutaneous TID WC Mansy, Jan A, MD   2 Units at 02/05/23 1318   insulin aspart (novoLOG) injection 0-5 Units  0-5 Units Subcutaneous QHS Mansy, Jan A, MD       linaclotide Rolan Lipa) capsule 72 mcg  72  mcg Oral QAC breakfast Mansy, Jan A, MD   72 mcg at 02/04/23 [id number]   magnesium hydroxide (MILK OF MAGNESIA) suspension 30 mL  30 mL Oral Daily PRN Mansy, Jan A, MD       multivitamin with minerals tablet 1 tablet  1 tablet Oral Daily Mansy, Jan A, MD   1 tablet at 02/04/23 1024   nicotine (NICODERM CQ - dosed in mg/24 hours) patch 14 mg  14 mg Transdermal Daily Sharion Settler, NP   14 mg at 02/05/23 0952   ondansetron (ZOFRAN) tablet 4 mg  4 mg Oral Q6H PRN Mansy, Jan A, MD       Or   ondansetron [facility]) injection 4 mg  4 mg Intravenous Q6H PRN Mansy, Arvella Merles, MD  oxyCODONE (Oxy IR/ROXICODONE) immediate release tablet 5 mg  5 mg Oral Daily PRN Mansy, Jan A, MD   5 mg at 02/04/23 0351   pantoprazole (PROTONIX) injection 40 mg  40 mg Intravenous Q24H Lucienne Minks, MD   40 mg at 02/05/23 0531   traZODone (DESYREL) tablet 25 mg  25 mg Oral QHS PRN Mansy, Jan A, MD   25 mg at 02/04/23 [id number]   Facility-Administered Medications Ordered in Other Encounters  Medication Dose Route Frequency Provider Last Rate Last Admin   sodium chloride flush (NS) 0.9 % injection 10 mL  10 mL Intravenous PRN Lequita Asal, MD   10 mL at 12/03/20 1138     Discharge Medications: Please see discharge summary for a list of discharge medications.  Relevant Imaging Results:  Relevant Lab Results:   Additional Information SS- [id number]  Gerilyn Pilgrim, LCSW

## 2023-02-05 NOTE — TOC PASRR Note (Signed)
RE: Kenneth Franklin Date of Birth: [birth year] Date: 02/05/2023     To Whom It May Concern:   Please be advised that the above-named patient will require a short-term nursing home stay - anticipated 30 days or less for rehabilitation and strengthening.  The plan is for return home

## 2023-02-06 ENCOUNTER — Inpatient Hospital Stay: Payer: Medicare Other

## 2023-02-06 DIAGNOSIS — C119 Malignant neoplasm of nasopharynx, unspecified: Secondary | ICD-10-CM | POA: Diagnosis not present

## 2023-02-06 LAB — BASIC METABOLIC PANEL
Anion gap: 12 (ref 5–15)
BUN: 10 mg/dL (ref 8–23)
CO2: 21 mmol/L — ABNORMAL LOW (ref 22–32)
Calcium: 8.5 mg/dL — ABNORMAL LOW (ref 8.9–10.3)
Chloride: 102 mmol/L (ref 98–111)
Creatinine, Ser: 0.91 mg/dL (ref 0.61–1.24)
GFR, Estimated: >60 mL/min (ref >60)
Glucose, Bld: 98 mg/dL (ref 70–99)
Potassium: 3.9 mmol/L (ref 3.5–5.1)
Sodium: 135 mmol/L (ref 135–145)

## 2023-02-06 LAB — MAGNESIUM: Magnesium: 1.9 mg/dL (ref 1.7–2.4)

## 2023-02-06 LAB — CBC
HCT: 38 % — ABNORMAL LOW (ref 39.0–52.0)
Hemoglobin: 12.2 g/dL — ABNORMAL LOW (ref 13.0–17.0)
MCH: 27.8 pg (ref 26.0–34.0)
MCHC: 32.1 g/dL (ref 30.0–36.0)
MCV: 86.6 fL (ref 80.0–100.0)
Platelets: 302 10*3/uL (ref 150–400)
RBC: 4.39 MIL/uL (ref 4.22–5.81)
RDW: 14 % (ref 11.5–15.5)
WBC: 19.3 10*3/uL — ABNORMAL HIGH (ref 4.0–10.5)
nRBC: 0 % (ref 0.0–0.2)

## 2023-02-06 LAB — RESP PANEL BY RT-PCR (RSV, FLU A&B, COVID)  RVPGX2
Influenza A by PCR: NEGATIVE
Influenza B by PCR: NEGATIVE
Resp Syncytial Virus by PCR: NEGATIVE
SARS Coronavirus 2 by RT PCR: NEGATIVE

## 2023-02-06 LAB — COMPREHENSIVE METABOLIC PANEL
ALT: 8 U/L (ref 0–44)
AST: 13 U/L — ABNORMAL LOW (ref 15–41)
Albumin: 3 g/dL — ABNORMAL LOW (ref 3.5–5.0)
Alkaline Phosphatase: 145 U/L — ABNORMAL HIGH (ref 38–126)
Anion gap: 12 (ref 5–15)
BUN: 8 mg/dL (ref 8–23)
CO2: 17 mmol/L — ABNORMAL LOW (ref 22–32)
Calcium: 8.4 mg/dL — ABNORMAL LOW (ref 8.9–10.3)
Chloride: 105 mmol/L (ref 98–111)
Creatinine, Ser: 0.78 mg/dL (ref 0.61–1.24)
GFR, Estimated: >60 mL/min (ref >60)
Glucose, Bld: 106 mg/dL — ABNORMAL HIGH (ref 70–99)
Potassium: 3.8 mmol/L (ref 3.5–5.1)
Sodium: 134 mmol/L — ABNORMAL LOW (ref 135–145)
Total Bilirubin: 1.1 mg/dL (ref 0.3–1.2)
Total Protein: 6.5 g/dL (ref 6.5–8.1)

## 2023-02-06 LAB — BLOOD GAS, VENOUS
Acid-base deficit: 3.6 mmol/L — ABNORMAL HIGH (ref 0.0–2.0)
Bicarbonate: 21.5 mmol/L (ref 20.0–28.0)
O2 Saturation: 59 %
Patient temperature: 37
pCO2, Ven: 38 mmHg — ABNORMAL LOW (ref 44–60)
pH, Ven: 7.36 (ref 7.25–7.43)
pO2, Ven: 40 mmHg (ref 32–45)

## 2023-02-06 LAB — GLUCOSE, CAPILLARY
Glucose-Capillary: 98 mg/dL (ref 70–99)
Glucose-Capillary: 91 mg/dL (ref 70–99)
Glucose-Capillary: 133 mg/dL — ABNORMAL HIGH (ref 70–99)
Glucose-Capillary: 246 mg/dL — ABNORMAL HIGH (ref 70–99)

## 2023-02-06 LAB — HEMOGLOBIN A1C
Hgb A1c MFr Bld: 6.7 % — ABNORMAL HIGH (ref 4.8–5.6)
Mean Plasma Glucose: 146 mg/dL

## 2023-02-06 LAB — PHOSPHORUS: Phosphorus: 3.2 mg/dL (ref 2.5–4.6)

## 2023-02-06 LAB — C-REACTIVE PROTEIN: CRP: 18.4 mg/dL — ABNORMAL HIGH (ref <1.0)

## 2023-02-06 MED ORDER — SODIUM BICARBONATE/SODIUM CHLORIDE MOUTHWASH
OROMUCOSAL | Status: DC | PRN
  Filled 2023-02-06: qty 1000

## 2023-02-06 MED ORDER — CLONAZEPAM 0.5 MG PO TBDP
0.5000 mg | ORAL_TABLET | Freq: Once | ORAL | Status: AC
  Administered 2023-02-06: 0.5 mg via ORAL
  Filled 2023-02-06: qty 1

## 2023-02-06 MED ORDER — SODIUM CHLORIDE 0.9 % IV SOLN
3.0000 g | Freq: Four times a day (QID) | INTRAVENOUS | Status: DC
  Administered 2023-02-06 – 2023-02-08 (×8): 3 g via INTRAVENOUS
  Filled 2023-02-06: qty 8
  Filled 2023-02-06 (×2): qty 3
  Filled 2023-02-06 (×2): qty 8
  Filled 2023-02-06: qty 3
  Filled 2023-02-06 (×3): qty 8

## 2023-02-06 NOTE — Progress Notes (Addendum)
Progress Note   Patient: Kenneth Franklin [id number] DOB: 05/21/1946 DOA: 02/03/2023     2 DOS: the patient was seen and examined on 02/06/2023   Brief hospital course:  Kenneth Franklin is a 77 y.o. male with medical history significant for anxiety, depression, hypertension, dyslipidemia, EtOH abuse and CVA, presented to the emergency room with his 77 with acute left sided weakness that started before midnight, without new numbness or paresthesias.  He denied any dysarthria or expressive dysphasia or dysphagia.  He was having gait abnormality secondary to his weakness.  He later had a fall.  He thought her left leg gave out.  No head injuries.  On 3/7 he was seen here in the ED after having a fall when he was sitting down on the edge of her bed and missed falling on the ground and hitting the back of his right shoulder.  He did not have any neurological deficits then and therefore when workup came back negative he was discharged.  He was still able to ambulate with walker at his baseline.  This morning he felt worse but was still able to ambulate before his weakness started.   No urinary or stool incontinence.  No tinnitus or vertigo.  No witnessed seizures.  No chest pain or palpitations.  No nausea or vomiting or abdominal pain.  No dysuria, oliguria or hematuria or flank pain.   Assessment and Plan: * CVA (cerebral vascular accident) [facility]) - Neurology consult appreciated  - TTE without significant findings - ASA 81 mg PO daily  - Plavix 75 mg PO daily  - Lipitor 80 mg PO daily  - Appreciate oncology assistance/consultation    Unwitnessed fall Overnight. No apparent trauma - will check CT head and x-ray of hips  Cough, leukocytosis Aspiration pneumonia O2 normal, cxr with RLL infiltrate concerning for aspiration. 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 Onc consulted 3/9, advises outpatient w/u, palliative involvement - palliative consulted - close outpt oncology f/u  Swallow dysfunction SLP following, npo currently pending MBS   Essential hypertension - Allow permissive HTN    Type 2 diabetes mellitus with complication, without long-term current use of insulin (HCC) - SSI  COPD (chronic obstructive pulmonary disease) (HCC) - Albuterol q4 hr PRN    Dyslipidemia - Cymbalta as above    Anxiety and depression - Cymbalta 30 mg PO daily    GERD without esophagitis - IV protonix 40 mg daily    Hypokalemia  - Resolved    DVT prophylaxis: Lovenox 40 mg sq daily  GI prophylaxis: Protonix 40 mg IV daily       Subjective: sleeping, rouses somewhat  Physical Exam: Vitals:   02/06/23 0106 02/06/23 0325 02/06/23 0700 02/06/23 1100  BP: (!) 149/78 118/71 124/68 112/62  Pulse: (!) 115 100 94 94  Resp: '20  20 20  '$ Temp: 98.1 F (36.7 C) 98 F (36.7 C) 97.8 F (36.6 C) 97.9 F (36.6 C)  TempSrc:   Oral Oral  SpO2: 99% 93% 93% 90%  Weight:      Height:       Constitutional:      Comments: asleep, rouses but not fully HENT:     Head: Normocephalic.  Cardiovascular:     Rate and Rhythm: Normal rate and regular rhythm.  Pulmonary:     Effort:  Pulmonary effort is normal.  Abdominal:     General: Abdomen is scaphoid.     Palpations: Abdomen is soft.  Musculoskeletal:        General: decreased muscle tone throughout Skin:    General: Skin is warm.  Neurological:     Mental Status: moving all 4 Psychiatric:     Comments: calm  Data Reviewed:   Disposition: Status is: Inpatient  Planned Discharge Destination: Barriers to discharge: Oncology and palliative discussion    Daughter Otila Kluver updated telephonically 3/11  Time spent: 35 minutes  Author: Desma Maxim, MD 02/06/2023 11:21 AM  For on call review [email or web address].

## 2023-02-06 NOTE — Progress Notes (Signed)
Kenneth Franklin   DOB:[birth year]   [id number]    Subjective: Patient resting in the bed.  He is confused.  Objective:  Vitals:   02/06/23 1223 02/06/23 1612  BP: 101/76 91/66  Pulse: 94 93  Resp: 18 16  Temp: 98 F (36.7 C) 98.2 F (36.8 C)  SpO2: 92% 97%     Intake/Output Summary (Last 24 hours) at 02/06/2023 1657 Last data filed at 02/06/2023 1239 Gross per 24 hour  Intake 100 ml  Output 150 ml  Net -50 ml  Patient is drowsy.    Oriented x 0-1.   Dry oral mucosa.  Moving his upper extremities.  However answering verbally. Not following all commands.  Physical Exam Vitals and nursing note reviewed.  HENT:     Head: Normocephalic and atraumatic.     Mouth/Throat:     Pharynx: Oropharynx is clear.  Eyes:     Extraocular Movements: Extraocular movements intact.     Pupils: Pupils are equal, round, and reactive to light.  Cardiovascular:     Rate and Rhythm: Normal rate and regular rhythm.  Pulmonary:     Comments: Decreased breath sounds bilaterally.  Abdominal:     Palpations: Abdomen is soft.  Musculoskeletal:        General: Normal range of motion.     Cervical back: Normal range of motion.  Skin:    General: Skin is warm.      Labs:  Lab Results  Component Value Date   WBC 19.3 (H) 02/06/2023   HGB 12.2 (L) 02/06/2023   HCT 38.0 (L) 02/06/2023   MCV 86.6 02/06/2023   PLT 302 02/06/2023   NEUTROABS 9.3 (H) 02/03/2023    Lab Results  Component Value Date   NA 135 02/06/2023   K 3.9 02/06/2023   CL 102 02/06/2023   CO2 21 (L) 02/06/2023    Studies:  CT HEAD WO CONTRAST (5MM)  Result Date: 02/06/2023 CLINICAL DATA:  Provided history: Fall.  Acute left-sided weakness. EXAM: CT HEAD WITHOUT CONTRAST TECHNIQUE: Contiguous axial images were obtained from the base of the skull through the vertex without intravenous contrast. RADIATION DOSE REDUCTION: This exam was performed according to the departmental dose-optimization program which includes automated  exposure control, adjustment of the mA and/or kV according to patient size and/or use of iterative reconstruction technique. COMPARISON:  Noncontrast head CT and CT angiogram head/neck 02/04/2023. Brain MRI 02/04/2023. FINDINGS: Brain: Cerebral and cerebellar atrophy. Kown patchy acute cortical and subcortical infarcts within the right MCA vascular territory, as well as right MCA/ACA and MCA/PCA watershed territories, many of which were better delineated on the brain MRI of 02/04/2023. No significant mass effect. No evidence of hemorrhagic conversion. Background advanced patchy and ill-defined hypoattenuation within the cerebral white matter, nonspecific but compatible with chronic small vessel ischemic disease. No extra-axial fluid collection. No evidence of an intracranial mass. No midline shift. Vascular: No hyperdense vessel.  Atherosclerotic calcifications. Skull: No fracture or aggressive osseous lesion. Sinuses/Orbits: No mass or acute finding within the imaged orbits. Postsurgical appearance of the paranasal sinuses. Complete opacification of the right maxillary sinus at the imaged levels. Mild mucosal thickening within the left maxillary sinus at the imaged levels. Small volume frothy secretions within the left sphenoid sinus. Extensive opacification of right ethmoid air cells. Other: Small-volume fluid within the bilateral mastoid air cells. 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 IMPRESSION: 1. Known patchy acute cortical/subcortical infarcts  within the right MCA vascular territory, as well as right MCA/ACA and MCA/PCA watershed territories. No significant mass effect. No evidence of hemorrhagic conversion. 2. Background parenchymal atrophy and advanced chronic small vessel ischemic disease. 3. Please refer to the prior CTA head/neck of 02/04/2023 for a description of findings concerning for recurrent  nasopharyngeal carcinoma, and for imaging follow-up recommendations. 4. Paranasal sinus disease, as described. 5. Small-volume fluid within the bilateral mastoid air cells. Electronically Signed   By: Kellie Simmering D.O.   On: 02/06/2023 16:17   DG Chest Port 1 View  Result Date: 02/06/2023 CLINICAL DATA:  Leukocytosis, gait abnormality EXAM: PORTABLE CHEST 1 VIEW COMPARISON:  02/02/2023 FINDINGS: 2 frontal views of the chest demonstrate a stable cardiac silhouette. There is developing airspace disease within the right infrahilar region. No effusion or pneumothorax. No acute bony abnormalities. IMPRESSION: 1. Developing right infrahilar airspace disease which could reflect pneumonia or aspiration. Electronically Signed   By: Randa Ngo M.D.   On: 02/06/2023 15:50   DG Pelvis Portable  Result Date: 02/06/2023 CLINICAL DATA:  [place], gait abnormality EXAM: PORTABLE PELVIS 1-2 VIEWS COMPARISON:  08/16/2022 FINDINGS: Supine frontal view of the pelvis includes both hips. There are no acute displaced fractures. Alignment is anatomic. Symmetrical bilateral hip osteoarthritis. The remainder of the bony pelvis is unremarkable. Extensive atherosclerosis of the aorta and iliac vessels. Degenerative changes of the lower lumbar spine. IMPRESSION: 1. No acute displaced fracture. 2. Stable bilateral symmetrical hip osteoarthritis. Electronically Signed   By: Randa Ngo M.D.   On: 02/06/2023 30:30    # 77 year old male patient with multiple medical problems including history of nasopharyngeal cancer currently on surveillance; also COPD/debility-is currently admitted to hospital for left-sided weakness.  # Left-sided weakness/right hemispheric MCA stroke-question secondary to local recurrence.  Defer to primary service/neurology for further management.  # History of nasopharyngeal cancer-concern for local recurrence involving right side of the neck and also destruction of the posterior wall/clivus.  Await  improvement of patient's mentation to consider MRI of the face with and without contrast for further evaluation.  # Delirium-multifactorial- defer to primary service/neurology-  I spoke at length with the patient's  daughter, Otila Kluver regarding the patient's clinical status/plan of care.  The daughter is in agreement.   Cammie Sickle, MD 02/06/2023  4:57 PM

## 2023-02-06 NOTE — Progress Notes (Signed)
Modified Barium Swallow Study  Patient Details  Name: Kenneth Franklin MRN: [id number] Date of Birth: [birth year]  Today's Date: 02/06/2023  Modified Barium Swallow completed.  Full report located under Chart Review in the Imaging Section.  History of Present Illness Patient is a 77 y.o. male with past medical history including nasopharyngeal cancer s/p chemo and radiation, malnutrition, dysphagia, alcohol abuse, CVA, DM, HTN, HLD, depression, anxiety, who presented 3/7 to ED with left sided weakness and fall; CT head was negative and pt was discharged home. Returned 02/03/23 with worsening weakness, found to have acute CVA, possible recurrence of nasopharyngeal cancer(?).    MRI brain 02/04/23: "Scattered acute infarcts in the right ACA-MCA border zone. No acute  hemorrhage or significant mass effect." CT Angio 02/04/23: "1. Moderate irregular narrowing of the lacerum segment of the right  ICA with surrounding mildly enhancing soft tissue along the posterior sphenoid sinus with associated bony destruction of the posterior wall and clivus. 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 Consider further characterization with sedated MRI of the face with and  without contrast. 2. 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 3. 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 4. Aortic Atherosclerosis".   CXR this admit: No evidence of acute cardiopulmonary disease.  CT of cervical spine: Chest "Unremarkable".   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 This presentation can increase risk for dysphagia and aspiration during oral intake. Also suspect some degree of  Cognitive-communication deficit.  Oral phase is characterized by disorganized lingual manipulation/control and weakness, disorganized lingual transport and lingual (and occasional lateral sulci) residue post-swallow. Pt tended to take large bolus sips when feeding self (thin liquid via Cup) and piecemealed(3x) the amount taken. Swallow initiation was delayed to the level of the pyriform sinuses w/ thin liquids; more superior to the vallecule w/ Nectar liquids in controlled sips.  Pharyngeal phase was noted for decreased base of tongue retraction and decreased strength and reduced stripping wave leading to moderate residue collection in the valleculae, BOT, slight amount in the pyriform sinuses, and min amount along the posterior pharyngeal wall. Trace laryngeal penetration occured x2 w/ thin liquids (trace-slight coating along the underside of the epiglottis); slightly reduced airway closure during the swallow w/ thin liquid trials d/t the delayed initiation. No aspiration occurred. No laryngeal penetration nor aspiration noted w/ any other consistency. More timely airway closure noted w/ Nectar liquids in controlled sips. Subsequent swallows were marginally effective in reducing oropharyngeal residue, however, when combined w/ alternating w/ sip of Nectar liquid, oropharyngeal clearance improved significantly, with minimal residue remaining in the valleculae and along BOT.  Pt required feeding support and MOD cues in setting of decreased overall awareness and ease of distraction. Factors that may increase risk of adverse event in presence of aspiration ([place] 2021): Frail or deconditioned;Poor general health and/or compromised immunity;Respiratory or GI disease;Limited mobility;Reduced cognitive function;Dependence for feeding and/or oral hygiene  Swallow Evaluation Recommendations Recommendations: PO diet PO Diet Recommendation: Dysphagia 2 (Finely chopped);Mildly thick liquids (Level 2, nectar  thick) Liquid Administration via: Cup;Spoon Medication Administration: Crushed with puree Supervision: Full assist for feeding;Full supervision/cueing for swallowing strategies Swallowing strategies  : Minimize environmental distractions;Slow rate;Small bites/sips;Multiple dry swallows after each bite/sip;Follow solids with liquids;Clear  throat intermittently Postural changes: Position pt fully upright for meals;Stay upright 30-60 min after meals Oral care recommendations: Oral care QID (4x/day);Oral care before PO;Staff/trained caregiver to provide oral care Recommended consults: Consider Palliative care;Consider dietitian consultation Caregiver Recommendations: Avoid jello, ice cream, thin soups, popsicles;Remove water pitcher;Have oral suction available          [name], MS, CCC-SLP Speech Language Pathologist Rehab Services; [place] [phone] (ascom) [name] 02/06/2023,4:33 PM

## 2023-02-06 NOTE — Progress Notes (Addendum)
       CROSS COVER NOTE  NAME: Kenneth Franklin MRN: [id number] DOB : 06-18-1946 ATTENDING PHYSICIAN: Gwynne Edinger, MD   Notified by nursing that patient had an unwitnessed fall at 20, [name] is not reporting of any pain and there is no visible sign of injury on assessment. The fall was unwitnessed and [name] reports point of impact being his hip, he reports he did not hit his head. [name] has baseline dementia and his report may not be reliable. We will continue to monitor and consider additional testing if patient begins to exhibit any symptoms of pain or change from neurological baseline.  This document was prepared using Dragon voice recognition software and may include unintentional dictation errors.  Neomia Glass DNP, MBA, FNP-BC Nurse Practitioner [facility] Pager [phone]

## 2023-02-06 NOTE — Progress Notes (Signed)
       CROSS COVER NOTE  NAME: Kenneth Franklin MRN: [id number] DOB : 1946-08-31 ATTENDING PHYSICIAN: Lucienne Minks, MD    Date of Service   02/06/2023   HPI/Events of Note   Message received from Rn "S: Pt is confused, yelling out to get OOB and for help B: Pt admitted for Stroke workup A: Pt is strict NPO, per ST: pending barium study on Monday R: Can pt have something IV or disintegrating to help him relax/sleep? Pt previously had: clonazePAM (KLONOPIN) disintegrating tablet 0.5mg  on 3/10 and staff reports he responded well"    Interventions   Assessment/Plan: Clonazepam x1      To reach the provider On-Call:   7AM- 7PM see care teams to locate the attending and reach out to them via [email or web address] Password: [email or web address] 7PM-7AM contact night-coverage If you still have difficulty reaching the appropriate provider, please page the [facility] (Director on Call) for [facility] on amion for assistance  This document was prepared using Systems analyst and may include unintentional dictation errors.  Neomia Glass DNP, MBA, FNP-BC, PMHNP-BC Nurse Practitioner [facility] Pager [phone]

## 2023-02-06 NOTE — Progress Notes (Signed)
   02/06/23 0130  What Happened  Was fall witnessed? No  Was patient injured? No  Patient found on floor  Found by Staff-comment Kenneth Cornea, RN)  Stated prior activity to/from bed, chair, or stretcher  Provider Notification  Provider Name/Title Neomia Glass, NP  Date Provider Notified 02/06/23  Time Provider Notified 0130  Method of Notification Page  Notification Reason Fall  Date of Provider Response 02/06/23  Time of Provider Response 0135  Follow Up  Family notified Yes - comment Kenneth Franklin, daughte)  Time family notified 0135  Additional tests  (repositioned)  Simple treatment Other (comment)  Progress note created (see row info) Yes  Blank note created Yes (see progress note)  Adult Fall Risk Assessment  Risk Factor Category (scoring not indicated) High fall risk per protocol (document High fall risk)  Patient Fall Risk Level High fall risk  Adult Fall Risk Interventions  Required Bundle Interventions *See Row Information* High fall risk - low, moderate, and high requirements implemented  Additional Interventions Use of appropriate toileting equipment (bedpan, BSC, etc.);Room near nurses station;Reorient/diversional activities with confused patients  Fall intervention(s) refused/Patient educated regarding refusal Bed alarm;Nonskid socks;Open door if unsupervised;Yellow bracelet;Supervision while toileting/edge of bed sitting  Screening for Fall Injury Risk (To be completed on HIGH fall risk patients) - Assessing Need for Floor Mats  Risk For Fall Injury- Criteria for Floor Mats Admitted as a result of a fall  Will Implement Floor Mats Yes  Pain Assessment  Pain Scale 0-10  Pain Score 0  PAINAD (Pain Assessment in Advanced Dementia)  Body Language 1  PCA/Epidural/Spinal Assessment  Respiratory Pattern Regular  Neurological  Neuro (WDL) X  Level of Consciousness Alert  Orientation Level Oriented to person  Cognition Poor attention/concentration;Poor judgement;Poor safety  awareness;Memory impairment  Speech Clear  Motor Function/Sensation Assessment Grip;Plantar flexion;Dorsiflexion  R Hand Grip Moderate  L Hand Grip Weak   R Foot Dorsiflexion Moderate  L Foot Dorsiflexion Moderate  R Foot Plantar Flexion Moderate  L Foot Plantar Flexion Moderate  Neuro Symptoms Forgetful;Other (Comment) (known dementia)  Glasgow Coma Scale  Eye Opening 4  Best Verbal Response (NON-intubated) 4  Best Motor Response 6  Glasgow Coma Scale Score 14  NIH Stroke Scale   Dizziness Present No  Headache Present No  Musculoskeletal  Musculoskeletal (WDL) X  Assistive Device None  Generalized Weakness Yes  Weight Bearing Restrictions No  Integumentary  Integumentary (WDL) X  RN Assisting with Skin Assessment on Admission  (skin also assessed with Kennyth Lose, RN)  Skin Color Appropriate for ethnicity  Skin Condition Dry  Skin Integrity Abrasion;Ecchymosis;Erythema/redness  Abrasion Location Knee  Abrasion Location Orientation Left  Abrasion Intervention Foam  Ecchymosis Location Arm;Back  Ecchymosis Location Orientation Bilateral  Erythema/Redness Location Arm  Erythema/Redness Location Orientation Bilateral

## 2023-02-07 LAB — CBC
HCT: 36.2 % — ABNORMAL LOW (ref 39.0–52.0)
Hemoglobin: 11.6 g/dL — ABNORMAL LOW (ref 13.0–17.0)
MCH: 27.9 pg (ref 26.0–34.0)
MCHC: 32 g/dL (ref 30.0–36.0)
MCV: 87 fL (ref 80.0–100.0)
Platelets: 299 10*3/uL (ref 150–400)
RBC: 4.16 MIL/uL — ABNORMAL LOW (ref 4.22–5.81)
RDW: 13.9 % (ref 11.5–15.5)
WBC: 14.8 10*3/uL — ABNORMAL HIGH (ref 4.0–10.5)
nRBC: 0 % (ref 0.0–0.2)

## 2023-02-07 LAB — BASIC METABOLIC PANEL
Anion gap: 9 (ref 5–15)
BUN: 18 mg/dL (ref 8–23)
CO2: 24 mmol/L (ref 22–32)
Calcium: 8.3 mg/dL — ABNORMAL LOW (ref 8.9–10.3)
Chloride: 103 mmol/L (ref 98–111)
Creatinine, Ser: 0.9 mg/dL (ref 0.61–1.24)
GFR, Estimated: >60 mL/min (ref >60)
Glucose, Bld: 174 mg/dL — ABNORMAL HIGH (ref 70–99)
Potassium: 3.4 mmol/L — ABNORMAL LOW (ref 3.5–5.1)
Sodium: 136 mmol/L (ref 135–145)

## 2023-02-07 LAB — GLUCOSE, CAPILLARY
Glucose-Capillary: 173 mg/dL — ABNORMAL HIGH (ref 70–99)
Glucose-Capillary: 203 mg/dL — ABNORMAL HIGH (ref 70–99)
Glucose-Capillary: 124 mg/dL — ABNORMAL HIGH (ref 70–99)
Glucose-Capillary: 167 mg/dL — ABNORMAL HIGH (ref 70–99)

## 2023-02-07 MED ORDER — PANTOPRAZOLE SODIUM 40 MG PO TBEC
40.0000 mg | DELAYED_RELEASE_TABLET | Freq: Every day | ORAL | Status: DC
  Administered 2023-02-08 – 2023-02-09 (×2): 40 mg via ORAL
  Filled 2023-02-07 (×2): qty 1

## 2023-02-07 NOTE — Care Management Important Message (Signed)
Important Message  Patient Details  Name: RODGER MALICK MRN: [id number] Date of Birth: [birth year]   Medicare Important Message Given:  Yes     Dannette Barbara 02/07/2023, 1:41 PM

## 2023-02-07 NOTE — Progress Notes (Signed)
MEWS Progress Note  Patient Details Name: TRINIDAD WICKER MRN: [id number] DOB: [birth year] Today's Date: 02/06/23   MEWS Flowsheet Documentation:  Assess: MEWS Score Temp: 98 F (36.7 C) BP: 94/63 MAP (mmHg): 73 Pulse Rate: 94 ECG Heart Rate: 82 Resp: 18 Level of Consciousness: Alert SpO2: 96 % O2 Device: Room Air Patient Activity (if Appropriate): In bed Assess: MEWS Score MEWS Temp: 0 MEWS Systolic: 1 MEWS Pulse: 0 MEWS RR: 0 MEWS LOC: 0 MEWS Score: 1 MEWS Score Color: Green Assess: SIRS CRITERIA SIRS Temperature : 0 SIRS Respirations : 0 SIRS Pulse: 1 SIRS WBC: 0 SIRS Score Sum : 1   Notify: Charge Nurse/RN Name of Charge Nurse/RN Notified: IT trainer Name/Title: Neomia Glass, NP Date Provider Notified: 02/06/23 Time Provider Notified: 0130 Method of Notification: Page Notification Reason: Fall Date of Provider Response: 02/06/23 Time of Provider Response: [id number]     Inserted for [facility] RN   Sonda Primes 02/07/2023, 12:27 PM

## 2023-02-07 NOTE — Progress Notes (Signed)
Physical Therapy Treatment Patient Details Name: Kenneth Franklin MRN: [id number] DOB: [birth year] Today's Date: 02/07/2023   History of Present Illness Kenneth Franklin is a 77 Patient is a 77 y.o. male with past medical history including nasopharyngeal cancer s/p chemo and radiation, dysphagia, alcohol abuse, CVA, DM, HTN, HLD, depression, anxiety, who presented 3/7 to ED with left sided weakness and fall; CT head was negative and pt was discharged home. Returned 02/03/23 with worsening weakness, found to have acute CVA, possible recurrence of nasopharyngeal cancer. MRI brain 02/04/23: "Scattered acute infarcts in the right ACA-MCA border zone. No acute  hemorrhage or significant mass effect." CT Angio 02/04/23: "1. Moderate irregular narrowing of the lacerum segment of the right  ICA with surrounding mildly enhancing soft tissue along the posterior sphenoid sinus with associated bony destruction of the posterior wall and clivus. 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 He reports generally not feeling well today. Assistance required for bed mobility with fair sitting balance. He participated with LE exercises in sitting for strengthening and declined standing due to fear of falling. He requested to return to bed after sitting up for several minutes. Recommend to continue PT to maximize independence and facilitate return to prior level of function.    Recommendations for follow up therapy are one component of a multi-disciplinary discharge planning process, led by the attending physician.  Recommendations may be updated based on patient status, additional functional criteria and insurance authorization.  Follow Up Recommendations  Skilled nursing-short term rehab (<3 hours/day) Can patient physically be transported by private vehicle: No   Assistance Recommended at Discharge Frequent or constant Supervision/Assistance  Patient can return  home with the following Two people to help with walking and/or transfers;Direct supervision/assist for medications management;Direct supervision/assist for financial management;Assistance with cooking/housework;Assistance with feeding;A lot of help with bathing/dressing/bathroom   Equipment Recommendations  None recommended by PT    Recommendations for Other Services       Precautions / Restrictions Precautions Precautions: Fall Restrictions Weight Bearing Restrictions: No     Mobility  Bed Mobility Overal bed mobility: Needs Assistance Bed Mobility: Supine to Sit, Sit to Supine     Supine to sit: Min assist Sit to supine: Min assist   General bed mobility comments: verbal cues for technique, task initiation. assistance for LE support    Transfers                   General transfer comment: patient refused, requested to return to bed after several minutes of sitting upright    Ambulation/Gait                   Stairs             Wheelchair Mobility    Modified Rankin (Stroke Patients Only)       Balance Overall balance assessment: Needs assistance Sitting-balance support: Bilateral upper extremity supported, Single extremity supported Sitting balance-Leahy Scale: Fair Sitting balance - Comments: flexed posture                                    Cognition Arousal/Alertness: Lethargic Behavior During Therapy: WFL for tasks assessed/performed Overall Cognitive Status: No family/caregiver present to determine baseline cognitive functioning  General Comments: patient is able to follow single step commands with increased time        Exercises General Exercises - Lower Extremity Long Arc Quad: AAROM, Strengthening, Both, 5 reps, Seated Other Exercises Other Exercises: verbal cues for technique    General Comments        Pertinent Vitals/Pain Pain Assessment Pain  Assessment: Faces Faces Pain Scale: Hurts little more    Home Living                          Prior Function            PT Goals (current goals can now be found in the care plan section) Acute Rehab PT Goals Patient Stated Goal: to feel better PT Goal Formulation: With patient Time For Goal Achievement: 02/19/23 Potential to Achieve Goals: Fair Progress towards PT goals: Progressing toward goals    Frequency    Min 2X/week      PT Plan Current plan remains appropriate    Co-evaluation              AM-PAC PT "6 Clicks" Mobility   Outcome Measure  Help needed turning from your back to your side while in a flat bed without using bedrails?: A Little Help needed moving from lying on your back to sitting on the side of a flat bed without using bedrails?: A Little Help needed moving to and from a bed to a chair (including a wheelchair)?: A Lot Help needed standing up from a chair using your arms (e.g., wheelchair or bedside chair)?: A Lot Help needed to walk in hospital room?: A Lot Help needed climbing 3-5 steps with a railing? : Total 6 Click Score: 13    End of Session   Activity Tolerance: Patient limited by fatigue Patient left: in bed;with call bell/phone within reach;with bed alarm set   PT Visit Diagnosis: Unsteadiness on feet (R26.81);Other abnormalities of gait and mobility (R26.89);Muscle weakness (generalized) (M62.81);Difficulty in walking, not elsewhere classified (R26.2);Other symptoms and signs involving the nervous system (R29.898);Pain     Time: 1147-1200 PT Time Calculation (min) (ACUTE ONLY): 13 min  Charges:  $Therapeutic Activity: 8-22 mins                     Minna Merritts, PT, MPT    Percell Locus 02/07/2023, 1:45 PM

## 2023-02-07 NOTE — Progress Notes (Signed)
  PROGRESS NOTE    [name]  [id number] DOB: Aug 02, 1946 DOA: 02/03/2023 PCP: Verita Lamb, NP  125A/125A-AA  LOS: 3 days   Brief hospital course:   Assessment & Plan: Kenneth Franklin is a 77 y.o. male with medical history significant for anxiety, depression, hypertension, dyslipidemia, EtOH abuse and CVA, presented to the emergency room with his 46 with acute left sided weakness that started before midnight, without new numbness or paresthesias.  He denied any dysarthria or expressive dysphasia or dysphagia.  He was having gait abnormality secondary to his weakness.  He later had a fall.    * CVA (cerebral vascular accident) [facility]) - Neurology consult appreciated  - TTE without significant findings - ASA 81 mg PO daily  - Plavix 75 mg PO daily  - Lipitor 80 mg PO daily  - Appreciate oncology assistance/consultation     Cough, leukocytosis Aspiration pneumonia O2 normal, cxr with RLL infiltrate concerning for aspiration. 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 Onc consulted 3/9, advises outpatient w/u, palliative involvement - palliative consulted - close outpt oncology f/u   Dysphagia SLP following --dys 2 with nectar thick   Essential hypertension - Allow permissive HTN    Type 2 diabetes mellitus with complication, without long-term current use of insulin (HCC) --ACHS and SSI   COPD (chronic obstructive pulmonary disease) (HCC) --cont daily Breo   Dyslipidemia --cont Lipitor 80   Anxiety and depression - Cymbalta 30 mg PO daily    GERD without esophagitis - switch back to oral PPI   Hypokalemia  --monitor and replete PRN   DVT prophylaxis: Lovenox SQ Code Status: Full code  Family Communication:  Level of care: Telemetry Medical Dispo:   The patient is from: home Anticipated d/c is to: SNF rehab Anticipated d/c date is:  whenever bed available   Subjective and Interval History:  Pt was sleepy.  No change.   Objective: Vitals:   02/07/23 0538 02/07/23 0800 02/07/23 1211 02/07/23 1643  BP: 96/61 104/60 94/63 (!) 115/52  Pulse: 88 87 94 92  Resp: 18 20 18 18   Temp: 97.6 F (36.4 C) 98 F (36.7 C) 98 F (36.7 C) 98.4 F (36.9 C)  TempSrc:  Axillary Oral   SpO2: 97% 92% 96% 100%  Weight:      Height:        Intake/Output Summary (Last 24 hours) at 02/07/2023 1904 Last data filed at 02/07/2023 1000 Gross per 24 hour  Intake 250 ml  Output --  Net 250 ml   Filed Weights   02/04/23 0152  Weight: 51.6 kg    Examination:   Constitutional: NAD, sleeping but arousable HEENT: conjunctivae and lids normal, EOMI CV: No cyanosis.   RESP: normal respiratory effort, on RA   Data Reviewed: I have personally reviewed labs and imaging studies  Time spent: 35 minutes  Enzo Bi, MD [facility] If 7PM-7AM, please contact night-coverage 02/07/2023, 7:04 PM

## 2023-02-08 DIAGNOSIS — Z515 Encounter for palliative care: Secondary | ICD-10-CM

## 2023-02-08 LAB — CBC
HCT: 35.6 % — ABNORMAL LOW (ref 39.0–52.0)
Hemoglobin: 11.1 g/dL — ABNORMAL LOW (ref 13.0–17.0)
MCH: 27.5 pg (ref 26.0–34.0)
MCHC: 31.2 g/dL (ref 30.0–36.0)
MCV: 88.1 fL (ref 80.0–100.0)
Platelets: 348 10*3/uL (ref 150–400)
RBC: 4.04 MIL/uL — ABNORMAL LOW (ref 4.22–5.81)
RDW: 14.1 % (ref 11.5–15.5)
WBC: 9 10*3/uL (ref 4.0–10.5)
nRBC: 0 % (ref 0.0–0.2)

## 2023-02-08 LAB — BASIC METABOLIC PANEL
Anion gap: 7 (ref 5–15)
BUN: 15 mg/dL (ref 8–23)
CO2: 24 mmol/L (ref 22–32)
Calcium: 8.2 mg/dL — ABNORMAL LOW (ref 8.9–10.3)
Chloride: 107 mmol/L (ref 98–111)
Creatinine, Ser: 0.77 mg/dL (ref 0.61–1.24)
GFR, Estimated: >60 mL/min (ref >60)
Glucose, Bld: 136 mg/dL — ABNORMAL HIGH (ref 70–99)
Potassium: 3.4 mmol/L — ABNORMAL LOW (ref 3.5–5.1)
Sodium: 138 mmol/L (ref 135–145)

## 2023-02-08 LAB — GLUCOSE, CAPILLARY
Glucose-Capillary: 126 mg/dL — ABNORMAL HIGH (ref 70–99)
Glucose-Capillary: 178 mg/dL — ABNORMAL HIGH (ref 70–99)
Glucose-Capillary: 129 mg/dL — ABNORMAL HIGH (ref 70–99)
Glucose-Capillary: 149 mg/dL — ABNORMAL HIGH (ref 70–99)

## 2023-02-08 LAB — MAGNESIUM: Magnesium: 2.1 mg/dL (ref 1.7–2.4)

## 2023-02-08 MED ORDER — POTASSIUM CHLORIDE CRYS ER 20 MEQ PO TBCR
40.0000 meq | EXTENDED_RELEASE_TABLET | Freq: Once | ORAL | Status: AC
  Administered 2023-02-08: 40 meq via ORAL
  Filled 2023-02-08: qty 2

## 2023-02-08 MED ORDER — ENSURE ENLIVE PO LIQD
237.0000 mL | Freq: Three times a day (TID) | ORAL | Status: DC
  Administered 2023-02-08 – 2023-02-09 (×2): 237 mL via ORAL

## 2023-02-08 MED ORDER — AMOXICILLIN-POT CLAVULANATE 875-125 MG PO TABS
1.0000 | ORAL_TABLET | Freq: Two times a day (BID) | ORAL | Status: DC
  Administered 2023-02-08 – 2023-02-09 (×2): 1 via ORAL
  Filled 2023-02-08 (×2): qty 1

## 2023-02-08 NOTE — TOC Progression Note (Signed)
Transition of Care [facility]) - Progression Note    Patient Details  Name: Kenneth Franklin MRN: [id number] Date of Birth: [birth year]  [facility]) CM/SW Contact  Gerilyn Pilgrim, LCSW Phone Number: 02/08/2023, 10:53 AM  Clinical Narrative:  CSW spoke with patients POA and daughter Otila Kluver, regarding bed offers. Otila Kluver reports she is not willing to accept any of these offers because they are a 3 star online and she only wants a 4 star and above. CSW explained options and explained if she wants we can extend the search. She refuses this and states the only facility she would consider is [facility]. CSW states that [facility] is unable to take. Otila Kluver wanted to know the reason why, CSW stated the reason noted by [facility] on the hub was pt has a hx of alcohol use. Otila Kluver adamant that this was a mistake and wants to speak with office of patient experience. CSW provided the number for this. CSW explained options and that there aren't any additional options if patient is not wanting to extend the search for a 4 star facility. Otila Kluver reports CSW is "typing her hands" and not "giving her choices". CSW explained she is free to decline bed and does not have to choose available options. CSW explained having private duty care options, Daughter declined and stated she did not want to take patient home. Otila Kluver states that she is now semi agreeable to [facility] but wanted to make it clear to the team that she was very upset about this. CSW stated to Otila Kluver that CSW will follow up with [facility] for a second time and see if they would reconsider but if not she would need to consider available options. CSW shared with Supervisor.           Expected Discharge Plan and Services                                               Social Determinants of Health (SDOH) Interventions SDOH Screenings   Tobacco Use: High Risk (02/03/2023)    Readmission Risk Interventions    10/27/2020     1:28 PM  Readmission Risk Prevention Plan  Transportation Screening Complete  PCP or Specialist Appt within 3-5 Days Complete  HRI or [place] Complete  Social Work Consult for [place] Planning/Counseling Complete  Palliative Care Screening Not Applicable  Medication Review [employer]) Complete

## 2023-02-08 NOTE — TOC Progression Note (Signed)
Transition of Care [facility]) - Progression Note    Patient Details  Name: Kenneth Franklin MRN: [id number] Date of Birth: [birth year]  Transition of Care [facility]) CM/SW Contact  Gerilyn Pilgrim, LCSW Phone Number: 02/08/2023, 1:32 PM  Clinical Narrative:   [facility]. Auth started. POA tina notified.          Expected Discharge Plan and Services                                               Social Determinants of Health (SDOH) Interventions SDOH Screenings   Tobacco Use: High Risk (02/03/2023)    Readmission Risk Interventions    10/27/2020    1:28 PM  Readmission Risk Prevention Plan  Transportation Screening Complete  PCP or Specialist Appt within 3-5 Days Complete  HRI or [place] Complete  Social Work Consult for [place] Planning/Counseling Complete  Palliative Care Screening Not Applicable  Medication Review [employer]) Complete

## 2023-02-08 NOTE — Progress Notes (Signed)
Physical Therapy Treatment Patient Details Name: Kenneth Franklin MRN: [id number] DOB: 1945/12/23 Today's Date: 02/08/2023   History of Present Illness Kenneth Franklin is a 24 Patient is a 77 y.o. male with past medical history including nasopharyngeal cancer s/p chemo and radiation, dysphagia, alcohol abuse, CVA, DM, HTN, HLD, depression, anxiety, who presented 3/7 to ED with left sided weakness and fall; CT head was negative and pt was discharged home. Returned 02/03/23 with worsening weakness, found to have acute CVA, possible recurrence of nasopharyngeal cancer. MRI brain 02/04/23: "Scattered acute infarcts in the right ACA-MCA border zone. No acute  hemorrhage or significant mass effect." CT Angio 02/04/23: "1. Moderate irregular narrowing of the lacerum segment of the right  ICA with surrounding mildly enhancing soft tissue along the posterior sphenoid sinus with associated bony destruction of the posterior wall and clivus. 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 He reports fatigue from having been out of bed and recently back to bed with staff assistance. He was agreeable to in  bed exercises for strengthening which were performed with assistance. Will continue PT while in the hospital to maximize independence and facilitate return to prior level of function.    Recommendations for follow up therapy are one component of a multi-disciplinary discharge planning process, led by the attending physician.  Recommendations may be updated based on patient status, additional functional criteria and insurance authorization.  Follow Up Recommendations  Skilled nursing-short term rehab (<3 hours/day) Can patient physically be transported by private vehicle: No   Assistance Recommended at Discharge Frequent or constant Supervision/Assistance  Patient can return home with the following Two people to help with walking and/or transfers;Direct  supervision/assist for medications management;Direct supervision/assist for financial management;Assistance with cooking/housework;Assistance with feeding;A lot of help with bathing/dressing/bathroom   Equipment Recommendations  None recommended by PT    Recommendations for Other Services       Precautions / Restrictions Precautions Precautions: Fall Restrictions Weight Bearing Restrictions: No     Mobility  Bed Mobility Overal bed mobility: Needs Assistance Bed Mobility: Supine to Sit, Sit to Supine     Supine to sit: Min assist     General bed mobility comments: assistance for trunk support. patient declined getting out of bed    Transfers                   General transfer comment: patient declined getting out of bed due to [place]. he just recently got back to bed from chair with staff assistance    Ambulation/Gait                   Stairs             Wheelchair Mobility    Modified Rankin (Stroke Patients Only)       Balance Overall balance assessment: Needs assistance Sitting-balance support: No upper extremity supported, Feet supported Sitting balance-Leahy Scale: Fair                                      Cognition Arousal/Alertness: Awake/alert Behavior During Therapy: WFL for tasks assessed/performed Overall Cognitive Status: No family/caregiver present to determine baseline cognitive functioning Area of Impairment: Following commands, Safety/judgement                       Following  Commands: Follows one step commands inconsistently Safety/Judgement: Decreased awareness of safety     General Comments: patient is groggy, sleeping on arrival to room        Exercises General Exercises - Lower Extremity Heel Slides: AAROM, Strengthening, Left, 10 reps, Supine Hip ABduction/ADduction: AAROM, Strengthening, Both, 10 reps, Supine Straight Leg Raises: AAROM, Strengthening, Both, 10 reps, Supine Other  Exercises Other Exercises: verbal cues for technique    General Comments        Pertinent Vitals/Pain Pain Assessment Pain Assessment: No/denies pain    Home Living                          Prior Function            PT Goals (current goals can now be found in the care plan section) Acute Rehab PT Goals Patient Stated Goal: to get rest PT Goal Formulation: With patient Time For Goal Achievement: 02/19/23 Potential to Achieve Goals: Fair Progress towards PT goals: Progressing toward goals    Frequency    Min 2X/week      PT Plan Current plan remains appropriate    Co-evaluation              AM-PAC PT "6 Clicks" Mobility   Outcome Measure  Help needed turning from your back to your side while in a flat bed without using bedrails?: A Little Help needed moving from lying on your back to sitting on the side of a flat bed without using bedrails?: A Little Help needed moving to and from a bed to a chair (including a wheelchair)?: A Lot Help needed standing up from a chair using your arms (e.g., wheelchair or bedside chair)?: A Lot Help needed to walk in hospital room?: A Lot Help needed climbing 3-5 steps with a railing? : Total 6 Click Score: 13    End of Session   Activity Tolerance: Patient limited by fatigue Patient left: in bed;with call bell/phone within reach;with bed alarm set Nurse Communication: Mobility status PT Visit Diagnosis: Unsteadiness on feet (R26.81);Other abnormalities of gait and mobility (R26.89);Muscle weakness (generalized) (M62.81);Difficulty in walking, not elsewhere classified (R26.2);Other symptoms and signs involving the nervous system (R29.898);Pain     Time: [id number] PT Time Calculation (min) (ACUTE ONLY): 10 min  Charges:  $Therapeutic Exercise: 8-22 mins                     Minna Merritts, PT, MPT    Percell Locus 02/08/2023, 11:59 AM

## 2023-02-08 NOTE — Plan of Care (Signed)
  Problem: Ischemic Stroke/TIA Tissue Perfusion: Goal: Complications of ischemic stroke/TIA will be minimized Outcome: Progressing   Problem: Coping: Goal: Will verbalize positive feelings about self Outcome: Progressing   Problem: Nutrition: Goal: Risk of aspiration will decrease Outcome: Progressing   Problem: Health Behavior/Discharge Planning: Goal: Ability to manage health-related needs will improve Outcome: Progressing   Problem: Clinical Measurements: Goal: Ability to maintain clinical measurements within normal limits will improve Outcome: Progressing   Problem: Activity: Goal: Risk for activity intolerance will decrease Outcome: Progressing   Problem: Coping: Goal: Level of anxiety will decrease Outcome: Progressing   Problem: Pain Managment: Goal: General experience of comfort will improve Outcome: Progressing   Problem: Safety: Goal: Ability to remain free from injury will improve Outcome: Progressing   Problem: Skin Integrity: Goal: Risk for impaired skin integrity will decrease Outcome: Progressing

## 2023-02-08 NOTE — Progress Notes (Addendum)
Speech Language Pathology Treatment: Dysphagia  Kenneth Franklin Details Name: Kenneth Franklin MRN: [id number] DOB: 1946-07-23 Today's Date: 02/08/2023 Time: 0830-0900 SLP Time Calculation (min) (ACUTE ONLY): 30 min  Assessment / Plan / Recommendation Clinical Impression  Pt seen today for Dysphagia treatment. Pt laying in bed w/ lights off upon SLP arrival. Pt alert, cooperative, and pleasant t/o eval. Pt required intermittent min/mod verbal cues to adhere to swallowing precautions. Pt left sitting up in bed w/ call button in reach and bed alarm set. Of note: Pt w/ baseline congested cough PRIOR TO administration of po's. Of note: Pt w/ Baseline Dementia per chart note/NP.  Pt on RA; afebrile; WBC WNL.  Pt strongly expressed his desire to drink water multiple times t/o eval. Pt educated on palliative care consult and purpose of GOC as it relates his diet. Pt appreciative and agreed. ST services will f/u w/ Palliative Care's findings/POC post discussion w/ pt.  Pt repositioned to sit upright in bed prior to po's. Observed w/ trials of thin liquids (~ 3 oz via CUP), puree (~2 oz), and Dys 2 solids (10+ bites). During oral phase, pt exhibited adequate bolus control/management, functional mastication, timely A-P transit, and clear oral cavity post-po's. During pharyngeal phase, pt exhibited clear vocal quality post-po's. Pt intermittently coughed b/t po's -- pt w/ baseline congested cough prior to po's. Cough did not appear to increased in intensity nor frequency w/ po's. Pt required min/mod verbal cues to follow aspiration precautions w/ a focus on SMALL, SLOW sips ONE at a time. Given cues, pt followed these recommendations during the tx session. Unsure of pt's full comprehension of aspiration precautions and ability to adhere to/carryover these precautions w/o full supervision and mod cues in setting of Baseline Dementia per chart note/NP. ANY pt w/ cognitive deficits is at increased risk of  aspiration/aspiration precaution. Following aspiration precautions can help reduce this risk.  Pt educated on diet recommendation, aspiration precautions and their purpose, palliative care consult, and SLP POC to include pt independently demonstrating follow through w/ precautions and strategies recommended before diet upgrade to thin liquids. Pt appreciative and agreed.  Recommend continue Dys 2 diet w/ nectar thick liquids (in setting of cognitive decline) via CUP. Follow Strict Aspiration precautions (SMALL sips/bites, ONE at a time, sitting upright) w/ all po's. Recommend full supervision during meals to cue for compensatory strategies. Pt able to feed self w/ set up. Recommend Palliative Care consult. ST services will continue to monitor pt for carryover of compensatory strategies and potential diet upgrade IF pt exhibits follow through of precautions/compensatory strategies through demonstration of strategies and/or participation in teach back.   HPI HPI: Kenneth Franklin is a 77 y.o. ale w/ past medical history including nasopharyngeal cancer s/p chemo and radiation, malnutrition, dysphagia, alcohol abuse, CVA, DM, HTN, HLD, depression, anxiety, who presented 3/7 to ED with left sided weakness and fall; CT head was negative and pt was discharged home. Returned 02/03/23 with worsening weakness, found to have acute CVA, possible recurrence of nasopharyngeal cancer(?).    MRI brain 02/04/23: "Scattered acute infarcts in the right ACA-MCA border zone. No acute  hemorrhage or significant mass effect." CT Angio 02/04/23: "1. Moderate irregular narrowing of the lacerum segment of the right  ICA with surrounding mildly enhancing soft tissue along the posterior sphenoid sinus with associated bony destruction of the posterior wall and clivus. 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 Consider further  characterization  with sedated MRI of the face with and  without contrast. 2. 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 3. 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 4. Aortic Atherosclerosis".   CXR this admit: No evidence of acute cardiopulmonary disease.  CT of cervical spine: Chest "Unremarkable". Unsure of pt's baseline cognitive status but per chart note/NP, diagnosis of Dementia was charted.      SLP Plan  Continue with current plan of care      Recommendations for follow up therapy are one component of a multi-disciplinary discharge planning process, led by the attending physician.  Recommendations may be updated based on Kenneth Franklin status, additional functional criteria and insurance authorization.    Recommendations  Diet recommendations: Dysphagia 2 (fine chop);Nectar-thick liquid Liquids provided via: Cup Medication Administration: Whole meds with puree Supervision: Kenneth Franklin able to self feed;Full supervision/cueing for compensatory strategies Compensations: Minimize environmental distractions;Slow rate;Small sips/bites Postural Changes and/or Swallow Maneuvers: Seated upright 90 degrees;Upright 30-60 min after meal                Oral Care Recommendations: Oral care BID;Oral care before and after PO Follow Up Recommendations: Skilled nursing-short term rehab (<3 hours/day) Assistance recommended at discharge: Frequent or constant Supervision/Assistance SLP Visit Diagnosis: Dysphagia, oropharyngeal phase (R13.12) Plan: Continue with current plan of care         Kenneth Franklin Graduate Clinician [place], Speech Pathology   Kenneth Franklin  02/08/2023, 11:59 AM

## 2023-02-08 NOTE — Consult Note (Signed)
[place] [facility] [phone] [phone]   Name: COLBEN DENNO Date: 02/08/2023 MRN: [id number]  DOB: 05/02/1946  Patient Care Team: Verita Lamb, NP as PCP - General Margaretha Sheffield, MD (Otolaryngology) Jason Coop, NP (Inactive) as Nurse Practitioner (Hospice and Palliative Medicine) Rica Koyanagi, MD as Referring Physician (Geriatric Medicine) Verlon Au, NP as Nurse Practitioner (Nurse Practitioner) Christean Grief, RN as Registered Nurse Cammie Sickle, MD as Consulting Physician (Oncology)    REASON FOR CONSULTATION: JAPHET GUIDEN is a 77 y.o. 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  Patient was admitted to the hospital in 02/04/2023 with weakness and falls.  CT of the head concerning for MCA infarct with narrowing of the right ICA and adjacent bony destruction concerning for possible recurrent nasopharyngeal carcinoma.  Palliative care was consulted to address goals.  SOCIAL HISTORY:     reports that he has been smoking cigars and cigarettes. He has a 40.00 pack-year smoking history. He has never used smokeless tobacco. He reports that he does not currently use alcohol. He reports that he does not use drugs.  Patient is a widower after having lost his wife a year ago.  He lives at home alone.  He was cared for by his stepdaughter, T9.  ADVANCE DIRECTIVES:  On file  CODE STATUS: DNR/DNI  PAST MEDICAL HISTORY: Past Medical History:  Diagnosis Date   Acute ischemic stroke ([place]) 2017   Alcohol abuse    Anxiety    Cancer ([place])    Depression    Diabetes mellitus without complication ([place])    Dyspnea    pcp knows and ordered rescue inhaler   Hyperlipidemia    Hypertension    Skin cancer     Squamous Cell Carcinoma In Situ    PAST SURGICAL HISTORY:  Past Surgical History:  Procedure Laterality Date   CATARACT EXTRACTION Bilateral    COLONOSCOPY     ESOPHAGOGASTRODUODENOSCOPY (EGD) WITH PROPOFOL N/A 09/26/2022   Procedure: ESOPHAGOGASTRODUODENOSCOPY (EGD) WITH PROPOFOL;  Surgeon: Lesly Rubenstein, MD;  Location: [facility] ENDOSCOPY;  Service: Endoscopy;  Laterality: N/A;   NASOPHARYNGOSCOPY N/A 08/12/2020   Procedure: ENDOSCOPIC NASOPHARYNGOSCOPY WITH BIOPSY;  Surgeon: Margaretha Sheffield, MD;  Location: [facility] ORS;  Service: ENT;  Laterality: N/A;   PORTA CATH INSERTION N/A 08/24/2020   Procedure: PORTA CATH INSERTION;  Surgeon: Algernon Huxley, MD;  Location: [place] [facility];  Service: Cardiovascular;  Laterality: N/A;   PORTA CATH REMOVAL N/A 07/22/2021   Procedure: PORTA CATH REMOVAL;  Surgeon: Algernon Huxley, MD;  Location: [place] [facility];  Service: Cardiovascular;  Laterality: N/A;    HEMATOLOGY/ONCOLOGY HISTORY:  Oncology History  Nasopharyngeal cancer ([place])  08/18/2020 Initial Diagnosis   Nasopharyngeal carcinoma ([place])   08/18/2020 Cancer Staging   Staging form: Pharynx - Nasopharynx, AJCC 8th Edition - Clinical stage from 08/18/2020: Stage III (cT3, cN0, cM0) - Signed by Lequita Asal, MD on 02/17/2021 Histopathologic type: Squamous cell carcinoma, keratinizing, NOS Stage prefix: Initial diagnosis ECOG performance status: Grade 1 Stage used in treatment planning: Yes National guidelines used in treatment planning: Yes Type of national guideline used in treatment planning: NCCN   08/24/2020 - 10/20/2020 Chemotherapy         12/31/2020 -  Chemotherapy    Patient is on Treatment  Plan: HEAD/NECK NASOPHARYNGEAL ADJUVANT CISPLATIN D1 + 5FU IVCI D1-5 Q28D        ALLERGIES:  is allergic to propoxyphene.  MEDICATIONS:  Current Facility-Administered Medications  Medication Dose Route Frequency Provider Last Rate Last Admin   acetaminophen (TYLENOL) tablet 650 mg   650 mg Oral Q6H PRN Mansy, Jan A, MD   650 mg at 02/07/23 1813   Or   acetaminophen (TYLENOL) suppository 650 mg  650 mg Rectal Q6H PRN Mansy, Jan A, MD       albuterol (PROVENTIL) (2.5 MG/3ML) 0.083% nebulizer solution 2.5 mg  2.5 mg Nebulization Q4H PRN Mansy, Jan A, MD       alum & mag hydroxide-simeth (MAALOX/MYLANTA) 200-200-20 MG/5ML suspension 30 mL  30 mL Oral Q4H PRN Mansy, Jan A, MD   30 mL at 02/04/23 0236   Ampicillin-Sulbactam (UNASYN) 3 g in sodium chloride 0.9 % 100 mL IVPB  3 g Intravenous Q6H Nazari, Walid A, RPH 200 mL/hr at 02/08/23 0641 3 g at 02/08/23 0641   aspirin EC tablet 81 mg  81 mg Oral Daily Mansy, Jan A, MD   81 mg at 02/08/23 0802   atorvastatin (LIPITOR) tablet 80 mg  80 mg Oral QHS Bhagat, Srishti L, MD   80 mg at 02/07/23 2139   cholecalciferol (VITAMIN D3) 25 MCG (1000 UNIT) tablet 2,000 Units  2,000 Units Oral Daily Mansy, Jan A, MD   2,000 Units at 02/08/23 0802   clopidogrel (PLAVIX) tablet 75 mg  75 mg Oral q morning Mansy, Jan A, MD   75 mg at 02/08/23 0803   DULoxetine (CYMBALTA) DR capsule 30 mg  30 mg Oral Daily Mansy, Jan A, MD   30 mg at 02/08/23 0802   enoxaparin (LOVENOX) injection 40 mg  40 mg Subcutaneous Q24H Mansy, Jan A, MD   40 mg at 02/08/23 0803   fluticasone furoate-vilanterol (BREO ELLIPTA) 200-25 MCG/ACT 1 puff  1 puff Inhalation Daily Lucienne Minks, MD   1 puff at [phone] [phone]   folic acid (FOLVITE) tablet 1 mg  1 mg Oral Daily Mansy, Jan A, MD   1 mg at 02/08/23 0802   insulin aspart (novoLOG) injection 0-15 Units  0-15 Units Subcutaneous TID WC Mansy, Jan A, MD   2 Units at 02/08/23 0805   insulin aspart (novoLOG) injection 0-5 Units  0-5 Units Subcutaneous QHS Mansy, Jan A, MD   2 Units at 02/06/23 2022   linaclotide (LINZESS) capsule 72 mcg  72 mcg Oral QAC breakfast Mansy, Jan A, MD   72 mcg at 02/08/23 0805   magnesium hydroxide (MILK OF MAGNESIA) suspension 30 mL  30 mL Oral Daily PRN Mansy, Jan A, MD       multivitamin with  minerals tablet 1 tablet  1 tablet Oral Daily Mansy, Jan A, MD   1 tablet at 02/08/23 0802   nicotine (NICODERM CQ - dosed in mg/24 hours) patch 14 mg  14 mg Transdermal Daily Sharion Settler, NP   14 mg at 02/08/23 0803   ondansetron (ZOFRAN) tablet 4 mg  4 mg Oral Q6H PRN Mansy, Jan A, MD       Or   ondansetron [facility]) injection 4 mg  4 mg Intravenous Q6H PRN Mansy, Jan A, MD       pantoprazole (PROTONIX) EC tablet 40 mg  40 mg Oral Daily Enzo Bi, MD   40 mg at 02/08/23 0803   potassium chloride SA (KLOR-CON M) CR tablet 40 mEq  40 mEq Oral Once Enzo Bi, MD       sodium bicarbonate/sodium chloride mouthwash 1056m   Mouth Rinse PRN WSi Raider NAilene Rud MD       traZODone (DESYREL) tablet 25 mg  25 mg Oral QHS PRN Mansy, Jan A, MD   25 mg at 02/07/23 2139   Facility-Administered Medications Ordered in Other Encounters  Medication Dose Route Frequency Provider Last Rate Last Admin   sodium chloride flush (NS) 0.9 % injection 10 mL  10 mL Intravenous PRN CLequita Asal MD   10 mL at 12/03/20 1138    VITAL SIGNS: BP (!) 124/59 (BP Location: Right Arm)   Pulse 71   Temp 97.9 F (36.6 C)   Resp 17   Ht 6' (1.829 m)   Wt 113 lb 12.1 oz (51.6 kg)   SpO2 93%   BMI 15.43 kg/m  Filed Weights   02/04/23 0152  Weight: 113 lb 12.1 oz (51.6 kg)    Estimated body mass index is 15.43 kg/m as calculated from the following:   Height as of this encounter: 6' (1.829 m).   Weight as of this encounter: 113 lb 12.1 oz (51.6 kg).  LABS: CBC:    Component Value Date/Time   WBC 9.0 02/08/2023 0647   HGB 11.1 (L) 02/08/2023 0647   HCT 35.6 (L) 02/08/2023 0647   PLT 348 02/08/2023 0647   MCV 88.1 02/08/2023 0647   NEUTROABS 9.3 (H) 02/03/2023 2335   LYMPHSABS 1.3 02/03/2023 2335   MONOABS 0.6 02/03/2023 2335   EOSABS 0.1 02/03/2023 2335   BASOSABS 0.1 02/03/2023 2335   Comprehensive Metabolic Panel:    Component Value Date/Time   NA 138 02/08/2023 0647   NA 137 12/15/2015 1056    K 3.4 (L) 02/08/2023 0647   CL 107 02/08/2023 0647   CO2 24 02/08/2023 0647   BUN 15 02/08/2023 0647   BUN 8 12/15/2015 1056   CREATININE 0.77 02/08/2023 0647   CREATININE 1.07 06/16/2016 1632   GLUCOSE 136 (H) 02/08/2023 0647   CALCIUM 8.2 (L) 02/08/2023 0647   AST 13 (L) 02/06/2023 0609   ALT 8 02/06/2023 0609   ALKPHOS 145 (H) 02/06/2023 0609   BILITOT 1.1 02/06/2023 0609   BILITOT 0.5 12/15/2015 1056   PROT 6.5 02/06/2023 0609   PROT 6.5 12/15/2015 1056   ALBUMIN 3.0 (L) 02/06/2023 0609   ALBUMIN 4.2 12/15/2015 1056    RADIOGRAPHIC STUDIES: DG Swallowing Func-Speech Pathology  Result Date: 02/07/2023 Table formatting from the original result was not included. Modified Barium Swallow Study Patient Details Name: RMILBERN SCHEFFLERMRN: [id number] of Birth: 5April 16, 1947Today's Date: 02/07/2023 HPI/PMH: HPI: Patient is a 77y.o. ale w/ past medical history including nasopharyngeal cancer s/p chemo and radiation, malnutrition, dysphagia, alcohol abuse, CVA, DM, HTN, HLD, depression, anxiety, who presented 3/7 to ED with left sided weakness and fall; CT head was negative and pt was discharged home. Returned 02/03/23 with worsening weakness, found to have acute CVA, possible recurrence of nasopharyngeal cancer(?).    MRI brain 02/04/23: "Scattered acute infarcts in the right ACA-MCA border zone. No acute  hemorrhage or significant mass effect." CT Angio 02/04/23: "1. Moderate irregular narrowing of the lacerum segment of the right  ICA with surrounding mildly enhancing soft tissue along the posterior sphenoid sinus with associated bony destruction of the posterior wall and clivus. 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  Consider further characterization with sedated MRI of the face with and  without contrast. 2. 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 3. At least moderate  stenosis of the left vertebral artery origin. 4. Aortic Atherosclerosis".   CXR this admit: No evidence of acute cardiopulmonary disease.  CT of cervical spine: Chest "Unremarkable". Clinical Impression: Clinical Impression: Patient presents w/ what appears to be moderate, possibly chronic, oropharyngeal dysphagia w/ contributing factors including overall weakness and malnutrition, sensorimotor deficits, ETOH abuse, dysphagia, and h/o XRT for nasophayrngeal cancer. This presentation can increase risk for dysphagia and aspiration during oral intake. Also suspect some degree of Cognitive-communication deficit.  Oral phase is characterized by min disorganized lingual manipulation/control and weakness, disorganized lingual transport and lingual (and occasional lateral sulci) residue post-swallow. Pt tended to take a large bolus sip (despite verbal cues NOT to) when feeding self (thin liquid via Cup) and piecemealed(3-4x) to clear the amount taken. Swallow initiation was delayed to the level of the pyriform sinuses w/ thin liquids; more superior to the vallecule w/ Nectar liquids in controlled sips.  Pharyngeal phase was noted for decreased base of tongue retraction and decreased strength and reduced stripping wave leading to moderate residue collection in the valleculae, BOT, slight amount in the pyriform sinuses, and min amount along the posterior pharyngeal wall. Trace laryngeal penetration occured x2 w/ thin liquids (trace-slight coating along the underside of the epiglottis); slightly reduced airway closure during the swallow w/ thin liquid trials d/t the delayed initiation. No aspiration occurred. No laryngeal penetration nor aspiration noted w/ any other consistency. More timely airway closure noted w/ Nectar liquids in controlled sips. Subsequent swallows were marginally effective in reducing oropharyngeal residue, however, when combined w/ alternating w/ sip of Nectar liquid, oropharyngeal clearance improved  significantly, with minimal residue remaining in the valleculae and along BOT.  Pt required feeding support and MOD cues in setting of decreased overall awareness and ease of distraction. Factors that may increase risk of adverse event in presence of aspiration ([place] 2021): Factors that may increase risk of adverse event in presence of aspiration ([place] 2021): Frail or deconditioned; Poor general health and/or compromised immunity; Respiratory or GI disease; Limited mobility; Reduced cognitive function; Dependence for feeding and/or oral hygiene Recommendations/Plan: Swallowing Evaluation Recommendations Swallowing Evaluation Recommendations Recommendations: PO diet PO Diet Recommendation: Dysphagia 2 (Finely chopped); Mildly thick liquids (Level 2, nectar thick) Liquid Administration via: Cup; Spoon Medication Administration: Crushed with puree Supervision: Full assist for feeding; Full supervision/cueing for swallowing strategies Swallowing strategies  : Minimize environmental distractions; Slow rate; Small bites/sips; Multiple dry swallows after each bite/sip; Follow solids with liquids; Clear throat intermittently Postural changes: Position pt fully upright for meals; Stay upright 30-60 min after meals Oral care recommendations: Oral care QID (4x/day); Oral care before PO; Staff/trained caregiver to provide oral care Recommended consults: Consider Palliative care; Consider dietitian consultation Caregiver Recommendations: Avoid jello, ice cream, thin soups, popsicles; Remove water pitcher; Have oral suction available Treatment Plan Treatment Plan Treatment recommendations: Therapy as outlined in treatment plan below Follow-up recommendations: Skilled nursing-short term rehab (<3 hours/day) Recommendations Comment: ongoing education/assessment Functional status assessment: Patient has had a recent decline in their functional status and/or demonstrates limited ability to make significant  improvements in function in a reasonable and predictable amount of time. Treatment frequency: Min 2x/week Treatment duration: 2 weeks Interventions: Aspiration precaution training; Compensatory techniques; Patient/family education; Trials of upgraded texture/liquids; Diet toleration management by SLP Recommendations Recommendations for follow up therapy  are one component of a multi-disciplinary discharge planning process, led by the attending physician.  Recommendations may be updated based on patient status, additional functional criteria and insurance authorization. Assessment: Orofacial Exam: Orofacial Exam Oral Cavity: Oral Hygiene: -- (Dry) Oral Cavity - Dentition: Poor condition; Missing dentition (some anterior present) Oral Motor/Sensory Function: WFL (no unilateral lingual/facial weakness noted) Anatomy: Anatomy: Other (Comment) (apparent phlegm noted in subglottic area) Thin Liquids: Thin Liquids (Level 0) Thin Liquids : Impaired Bolus delivery method: Spoon; Cup Thin Liquid - Impairment: Oral Impairment; Pharyngeal impairment Lip Closure: No labial escape Tongue control during bolus hold: Posterior escape of less than half of bolus; Escape to lateral buccal cavity/floor of mouth Bolus transport/lingual motion: Repetitive/disorganized tongue motion Oral residue: Residue collection on oral structures Location of oral residue : Tongue; Floor of mouth (min) Initiation of swallow : Pyriform sinuses Soft palate elevation: No bolus between soft palate (SP)/pharyngeal wall (PW) Laryngeal elevation: Partial superior movement of thyroid cartilage/partial approximation of arytenoids to epiglottic petiole Anterior hyoid excursion: Partial Epiglottic movement: Partial Laryngeal vestibule closure: Incomplete, narrow column air/contrast in laryngeal vestibule Pharyngeal stripping wave : Present - diminished Pharyngeal contraction (A/P view only): N/A Pharyngoesophageal segment opening: Complete distension and complete  duration, no obstruction of flow Tongue base retraction: Narrow column of contrast or air between tongue base and PPW Pharyngeal residue: Collection of residue within or on pharyngeal structures Location of pharyngeal residue: Tongue base; Valleculae; Pharyngeal wall Penetration/Aspiration Scale (PAS) score: 2.  Material enters airway, remains ABOVE vocal cords then ejected out  Mildly Thick Liquids: Mildly thick liquids (Level 2, nectar thick) Mildly thick liquids (Level 2, nectar thick): Impaired Bolus delivery method: Spoon Mildly Thick Liquid - Impairment: Oral Impairment; Pharyngeal impairment Lip Closure: No labial escape Tongue control during bolus hold: Posterior escape of less than half of bolus Bolus transport/lingual motion: Brisk tongue motion Oral residue: Residue collection on oral structures Location of oral residue : Tongue Initiation of swallow : Posterior laryngeal surface of the epiglottis Soft palate elevation: No bolus between soft palate (SP)/pharyngeal wall (PW) Laryngeal elevation: Partial superior movement of thyroid cartilage/partial approximation of arytenoids to epiglottic petiole Anterior hyoid excursion: Partial Epiglottic movement: Partial Laryngeal vestibule closure: Complete, no air/contrast in laryngeal vestibule Pharyngeal stripping wave : Present - diminished Pharyngeal contraction (A/P view only): N/A Pharyngoesophageal segment opening: Complete distension and complete duration, no obstruction of flow Tongue base retraction: Narrow column of contrast or air between tongue base and PPW Pharyngeal residue: Collection of residue within or on pharyngeal structures Location of pharyngeal residue: Valleculae; Tongue base; Pharyngeal wall Penetration/Aspiration Scale (PAS) score: 1.  Material does not enter airway  Moderately Thick Liquids: Moderately thick liquids (Level 3, honey thick) Moderately thick liquids (Level 3, honey thick): Impaired (same as mildly thick liquid)  Puree:  Puree Puree: Impaired Puree - Impairment: Oral Impairment; Pharyngeal impairment Lip Closure: No labial escape Bolus transport/lingual motion: Repetitive/disorganized tongue motion Oral residue: Residue collection on oral structures Location of oral residue : Tongue Initiation of swallow: Valleculae Soft palate elevation: No bolus between soft palate (SP)/pharyngeal wall (PW) Laryngeal elevation: Partial superior movement of thyroid cartilage/partial approximation of arytenoids to epiglottic petiole Anterior hyoid excursion: Partial Epiglottic movement: Partial Laryngeal vestibule closure: Complete, no air/contrast in laryngeal vestibule Pharyngeal stripping wave : Present - diminished Pharyngeal contraction (A/P view only): N/A Pharyngoesophageal segment opening: Complete distension and complete duration, no obstruction of flow Tongue base retraction: Narrow column of contrast or air between tongue base and PPW Pharyngeal residue:  Collection of residue within or on pharyngeal structures Location of pharyngeal residue: Tongue base; Valleculae; Pharyngeal wall Penetration/Aspiration Scale (PAS) score: 1.  Material does not enter airway Solid: Solid Solid: Impaired Solid - Impairment: Oral Impairment; Pharyngeal impairment Lip Closure: No labial escape Bolus preparation/mastication: Disorganized chewing/mashing with solid pieces of bolus unchewed Bolus transport/lingual motion: Brisk tongue motion Oral residue: Residue collection on oral structures Location of oral residue : Tongue Initiation of swallow: Valleculae Soft palate elevation: No bolus between soft palate (SP)/pharyngeal wall (PW) Laryngeal elevation: Partial superior movement of thyroid cartilage/partial approximation of arytenoids to epiglottic petiole Anterior hyoid excursion: Partial Epiglottic movement: Partial Laryngeal vestibule closure: Complete, no air/contrast in laryngeal vestibule Pharyngeal stripping wave : Present - diminished Pharyngeal  contraction (A/P view only): N/A Pharyngoesophageal segment opening: Complete distension and complete duration, no obstruction of flow Tongue base retraction: Narrow column of contrast or air between tongue base and PPW Pharyngeal residue: Collection of residue within or on pharyngeal structures Location of pharyngeal residue: Tongue base; Pharyngeal wall; Valleculae Penetration/Aspiration Scale (PAS) score: 1.  Material does not enter airway Pill: Pill Pill: Not Tested Compensatory Strategies: Compensatory Strategies Compensatory strategies: Yes Other(comment): Ineffective (f/u, Dry swallow to aid clearing oropharynx - minimally effective)   General Information: Caregiver present: No  Diet Prior to this Study: NPO   Temperature : Normal (wbc elevated)   Respiratory Status: WFL   Supplemental O2: None (Room air)   History of Recent Intubation: No  Behavior/Cognition: Alert; Cooperative; Pleasant mood; Requires cueing; Distractible Self-Feeding Abilities: Able to self-feed; Needs assist with self-feeding Baseline vocal quality/speech: Normal Volitional Cough: Able to elicit Volitional Swallow: Able to elicit Exam Limitations: No limitations Goal Planning: Prognosis for improved oropharyngeal function: Fair Barriers to Reach Goals: Cognitive deficits; Time post onset; Severity of deficits; Overall medical prognosis Barriers/Prognosis Comment: see medical history Patient/Family Stated Goal: "lie down" Consulted and agree with results and recommendations: Patient; Physician; Nurse; Dietitian Pain: Pain Assessment Pain Assessment: Faces Pain Score: 7 Body Language: 1 Pain Location: R shoulder and chest. Pain Intervention(s): Monitored during session; Patient requesting pain meds-RN notified End of Session: Start Time:SLP Start Time (ACUTE ONLY): [id number] Stop Time: SLP Stop Time (ACUTE ONLY): 1630 Time Calculation:SLP Time Calculation (min) (ACUTE ONLY): 75 min Charges: SLP Evaluations $ SLP Speech Visit: 1 Visit SLP  Evaluations $BSS Swallow: 1 Procedure $MBS Swallow: 1 Procedure $ SLP EVAL LANGUAGE/SOUND PRODUCTION: 1 Procedure $Swallowing Treatment: 1 Procedure SLP visit diagnosis: SLP Visit Diagnosis: Dysphagia, oropharyngeal phase (R13.12); Cognitive communication deficit (R41.841) Past Medical History: Past Medical History: Diagnosis Date  Acute ischemic stroke ([place]) 2017  Alcohol abuse   Anxiety   Cancer ([place])   Depression   Diabetes mellitus without complication ([place])   Dyspnea   pcp knows and ordered rescue inhaler  Hyperlipidemia   Hypertension   Skin cancer   Squamous Cell Carcinoma In Situ Past Surgical History: Past Surgical History: Procedure Laterality Date  CATARACT EXTRACTION Bilateral   COLONOSCOPY    ESOPHAGOGASTRODUODENOSCOPY (EGD) WITH PROPOFOL N/A 09/26/2022  Procedure: ESOPHAGOGASTRODUODENOSCOPY (EGD) WITH PROPOFOL;  Surgeon: Lesly Rubenstein, MD;  Location: [facility] END[facility];  Service: Endoscopy;  Laterality: N/A;  NASOPHARYNGOSCOPY N/A 08/12/2020  Procedure: ENDOSCOPIC NASOPHARYNGOSCOPY WITH BIOPSY;  Surgeon: Margaretha Sheffield, MD;  Location: [facility];  Service: ENT;  Laterality: N/A;  PORTA CATH INSERTION N/A 08/24/2020  Procedure: PORTA CATH INSERTION;  Surgeon: Algernon Huxley, MD;  Location: [place] [facility];  Service: Cardiovascular;  Laterality: N/A;  PORTA CATH REMOVAL N/A 07/22/2021  Procedure: PORTA CATH REMOVAL;  Surgeon: Algernon Huxley, MD;  Location: [place] [facility];  Service: Cardiovascular;  Laterality: N/A; [name], MS, CCC-SLP Speech Language Pathologist Rehab Services; [place] [phone] (ascom) Watson,Katherine 02/07/2023, 8:10 AM  CT HEAD WO CONTRAST (5MM)  Result Date: 02/06/2023 CLINICAL DATA:  Provided history: Fall.  Acute left-sided weakness. EXAM: CT HEAD WITHOUT CONTRAST TECHNIQUE: Contiguous axial images were obtained from the base of the skull through the vertex without intravenous contrast. RADIATION DOSE REDUCTION: This exam was performed according to  the departmental dose-optimization program which includes automated exposure control, adjustment of the mA and/or kV according to patient size and/or use of iterative reconstruction technique. COMPARISON:  Noncontrast head CT and CT angiogram head/neck 02/04/2023. Brain MRI 02/04/2023. FINDINGS: Brain: Cerebral and cerebellar atrophy. Kown patchy acute cortical and subcortical infarcts within the right MCA vascular territory, as well as right MCA/ACA and MCA/PCA watershed territories, many of which were better delineated on the brain MRI of 02/04/2023. No significant mass effect. No evidence of hemorrhagic conversion. Background advanced patchy and ill-defined hypoattenuation within the cerebral white matter, nonspecific but compatible with chronic small vessel ischemic disease. No extra-axial fluid collection. No evidence of an intracranial mass. No midline shift. Vascular: No hyperdense vessel.  Atherosclerotic calcifications. Skull: No fracture or aggressive osseous lesion. Sinuses/Orbits: No mass or acute finding within the imaged orbits. Postsurgical appearance of the paranasal sinuses. Complete opacification of the right maxillary sinus at the imaged levels. Mild mucosal thickening within the left maxillary sinus at the imaged levels. Small volume frothy secretions within the left sphenoid sinus. Extensive opacification of right ethmoid air cells. Other: Small-volume fluid within the bilateral mastoid air cells. 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 IMPRESSION: 1. Known patchy acute cortical/subcortical infarcts within the right MCA vascular territory, as well as right MCA/ACA and MCA/PCA watershed territories. No significant mass effect. No evidence of hemorrhagic conversion. 2. Background parenchymal atrophy and advanced chronic small vessel ischemic disease. 3. 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 4. Paranasal sinus disease, as described. 5. Small-volume fluid within the bilateral mastoid air cells. Electronically Signed   By: Kellie Simmering D.O.   On: 02/06/2023 16:17   DG Chest Port 1 View  Result Date: 02/06/2023 CLINICAL DATA:  Leukocytosis, gait abnormality EXAM: PORTABLE CHEST 1 VIEW COMPARISON:  02/02/2023 FINDINGS: 2 frontal views of the chest demonstrate a stable cardiac silhouette. There is developing airspace disease within the right infrahilar region. No effusion or pneumothorax. No acute bony abnormalities. IMPRESSION: 1. Developing right infrahilar airspace disease which could reflect pneumonia or aspiration. Electronically Signed   By: Randa Ngo M.D.   On: 02/06/2023 15:50   DG Pelvis Portable  Result Date: 02/06/2023 CLINICAL DATA:  [place], gait abnormality EXAM: PORTABLE PELVIS 1-2 VIEWS COMPARISON:  08/16/2022 FINDINGS: Supine frontal view of the pelvis includes both hips. There are no acute displaced fractures. Alignment is anatomic. Symmetrical bilateral hip osteoarthritis. The remainder of the bony pelvis is unremarkable. Extensive atherosclerosis of the aorta and iliac vessels. Degenerative changes of the lower lumbar spine. IMPRESSION: 1. No acute displaced fracture. 2. Stable bilateral symmetrical hip osteoarthritis. Electronically Signed   By: Randa Ngo M.D.   On: 02/06/2023 15:49   ECHOCARDIOGRAM COMPLETE BUBBLE STUDY  Result Date: 02/04/2023    ECHOCARDIOGRAM REPORT  Patient Name:   GEROLD WEIGMAN Date of Exam: 02/04/2023 Medical Rec #:  [id number]       Height:       72.0 in Accession #:    [id number]      Weight:       113.8 lb Date of Birth:  20-Feb-1946        BSA:          1.676 m Patient Age:    52 years        BP:           110/72 mmHg Patient Gender: M               HR:           91 bpm. Exam Location:  [facility] Procedure: 2D Echo  and Saline Contrast Bubble Study Indications:     Stroke I63.9  History:         Patient has no prior history of Echocardiogram examinations.  Sonographer:     Kathlen Brunswick RDCS Referring Phys:  [id number] [place] Diagnosing Phys: Ida Rogue MD  Sonographer Comments: Technically challenging study due to limited acoustic windows, no parasternal window and no apical window. Image acquisition challenging due to respiratory motion. Limited images were obtained from the subcostal window. IMPRESSIONS  1. Left ventricular ejection fraction, by estimation, is 55 to 60%. The left ventricle has normal function. Challenging images, grossly the left ventricle has no regional wall motion abnormalities. Left ventricular diastolic parameters are consistent with Grade I diastolic dysfunction (impaired relaxation).  2. Right ventricular systolic function is normal. The right ventricular size is normal.  3. The mitral valve is normal in structure. No evidence of mitral valve regurgitation. No evidence of mitral stenosis.  4. The aortic valve has an indeterminant number of cusps. There is moderate calcification of the aortic valve. Aortic valve regurgitation is not visualized. Aortic valve Vmax measures 1.41 m/s. Challenging images, aortic valve gradient not well estimated, degree of stenosis likely underestimated.  5. The inferior vena cava is normal in size with greater than 50% respiratory variability, suggesting right atrial pressure of 3 mmHg.  6. Agitated saline contrast bubble study was negative, with no evidence of any interatrial shunt. FINDINGS  Left Ventricle: Left ventricular ejection fraction, by estimation, is 55 to 60%. The left ventricle has normal function. The left ventricle has no regional wall motion abnormalities. The left ventricular internal cavity size was normal in size. There is  no left ventricular hypertrophy. Left ventricular diastolic parameters are consistent with Grade I diastolic dysfunction  (impaired relaxation). Right Ventricle: The right ventricular size is normal. No increase in right ventricular wall thickness. Right ventricular systolic function is normal. Left Atrium: Left atrial size was normal in size. Right Atrium: Right atrial size was normal in size. Pericardium: There is no evidence of pericardial effusion. Mitral Valve: The mitral valve is normal in structure. There is mild calcification of the mitral valve leaflet(s). No evidence of mitral valve regurgitation. No evidence of mitral valve stenosis. Tricuspid Valve: The tricuspid valve is normal in structure. Tricuspid valve regurgitation is mild . No evidence of tricuspid stenosis. Aortic Valve: The aortic valve has an indeterminant number of cusps. There is moderate calcification of the aortic valve. Aortic valve regurgitation is not visualized. Aortic valve peak gradient measures 8.0 mmHg. Pulmonic Valve: The pulmonic valve was normal in structure. Pulmonic valve regurgitation is not visualized. No evidence of pulmonic stenosis. Aorta: The aortic root is normal  in size and structure. Venous: The inferior vena cava is normal in size with greater than 50% respiratory variability, suggesting right atrial pressure of 3 mmHg. IAS/Shunts: No atrial level shunt detected by color flow Doppler. Agitated saline contrast was given intravenously to evaluate for intracardiac shunting. Agitated saline contrast bubble study was negative, with no evidence of any interatrial shunt.   Diastology LV e' medial:    5.55 cm/s LV E/e' medial:  11.3 LV e' lateral:   4.35 cm/s LV E/e' lateral: 14.5  AORTIC VALVE AV Vmax:      141.00 cm/s AV Peak Grad: 8.0 mmHg MITRAL VALVE MV Area (PHT): 3.50 cm MV Decel Time: 217 msec MV E velocity: 62.90 cm/s MV A velocity: 61.80 cm/s MV E/A ratio:  1.02 Ida Rogue MD Electronically signed by Ida Rogue MD Signature Date/Time: 02/04/2023/11:46:41 AM    Final    CT ANGIO HEAD NECK W WO CM  Result Date:  02/04/2023 CLINICAL DATA:  Neuro deficit, acute, stroke suspected. EXAM: CT HEAD WITHOUT CONTRAST CT ANGIOGRAPHY OF THE HEAD AND NECK TECHNIQUE: Contiguous axial images were obtained from the base of the skull through the vertex without intravenous contrast. Multidetector CT imaging of the head and neck was performed using the standard protocol during bolus administration of intravenous contrast. Multiplanar CT image reconstructions and MIPs were obtained to evaluate the vascular anatomy. Carotid stenosis measurements (when applicable) are obtained utilizing NASCET criteria, using the distal internal carotid diameter as the denominator. RADIATION DOSE REDUCTION: This exam was performed according to the departmental dose-optimization program which includes automated exposure control, adjustment of the mA and/or kV according to patient size and/or use of iterative reconstruction technique. CONTRAST:  61m OMNIPAQUE IOHEXOL 350 MG/ML SOLN COMPARISON:  MRI brain 02/04/2023.  CT head 02/03/2023. FINDINGS: CT HEAD Brain: Subtle loss of gray-white differentiation along the posterior right ACA-MCA border zone, better evaluated on same day brain MRI. No acute intracranial hemorrhage or significant mass effect. Unchanged severe chronic small-vessel disease and moderate generalized volume loss. Vascular: No hyperdense vessel. Skull: No calvarial fracture or suspicious bone lesion. Skull base is unremarkable. Sinuses/Orbits: Chronic right maxillary sinusitis with near-complete opacification of the right ethmoid air cells. Mild mucosal disease of the left maxillary sinus. Orbits are unremarkable. CTA NECK Aortic arch: Three-vessel arch configuration. Atherosclerotic calcifications of the aortic arch and arch vessel origins. Arch vessel origins are patent. Right carotid system: Predominantly soft plaque along the proximal right cervical ICA results in approximately 60% stenosis by NASCET criteria (axial image 418 series 9). Left  carotid system: Calcified plaque along the left carotid bulb and proximal ICA without stenosis. Vertebral arteries:Right dominant vertebral artery with mixed plaque along the right V1 and V2 segments, as well as calcified plaque along the V3 and V4 segments. No significant stenosis or evidence of dissection. At least moderate stenosis of the left vertebral artery origin due to mixed plaque. Skeleton: Cervical spondylosis with mild spinal canal stenosis at C5-6 and C6-7. Other neck: Unremarkable. CTA HEAD Anterior circulation: Moderate irregular narrowing of the lacerum segment of the right ICA with surrounding mildly enhancing soft tissue along the posterior sphenoid sinus with associated bony destruction of the posterior wall and clivus (image 271 series 9). Atherosclerotic calcifications of the left ICA without stenosis. The proximal ACAs and MCAs are patent without stenosis or aneurysm. Distal branches are symmetric. Posterior circulation: Normal basilar artery. The SCAs, AICAs and PICAs are patent proximally. The PCAs are patent proximally without stenosis or aneurysm. Distal branches are  symmetric. Venous sinuses: As permitted by early phase of contrast, patent. Anatomic variants: None. IMPRESSION: 1. Moderate irregular narrowing of the lacerum segment of the right ICA with surrounding mildly enhancing soft tissue along the posterior sphenoid sinus with associated bony destruction of the posterior wall and clivus. 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 Consider further characterization with sedated MRI of the face with and without contrast. 2. 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 3. 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 4. Aortic Atherosclerosis (ICD10-I70.0). Electronically Signed   By: Emmit Alexanders M.D.   On: 02/04/2023 10:39   MR BRAIN WO  CONTRAST  Result Date: 02/04/2023 CLINICAL DATA:  Neuro deficit, acute, stroke suspected. EXAM: MRI HEAD WITHOUT CONTRAST TECHNIQUE: Multiplanar, multiecho pulse sequences of the brain and surrounding structures were obtained without intravenous contrast. COMPARISON:  Head CT 02/03/2023.  MRI brain 11/05/2022. FINDINGS: Motion degraded study. Brain: Scattered acute infarcts in the right ACA-MCA border zone. No acute hemorrhage or significant mass effect. Unchanged severe chronic small-vessel disease and generalized volume loss. No acute hydrocephalus or extra-axial collection. Vascular: Limited evaluation due to motion artifact. Skull and upper cervical spine: Normal marrow signal. Sinuses/Orbits: Near-complete opacification of the right maxillary sinus and right ethmoid air cells. Limited evaluation of the orbits due to motion artifact. Other: None. IMPRESSION: Scattered acute infarcts in the right ACA-MCA border zone. No acute hemorrhage or significant mass effect. Electronically Signed   By: Emmit Alexanders M.D.   On: 02/04/2023 10:14   CT HEAD WO CONTRAST (5MM)  Result Date: 02/04/2023 CLINICAL DATA:  Head trauma, minor (Age >= 65y); Neck trauma (Age >= 65y) EXAM: CT HEAD WITHOUT CONTRAST CT CERVICAL SPINE WITHOUT CONTRAST TECHNIQUE: Multidetector CT imaging of the head and cervical spine was performed following the standard protocol without intravenous contrast. Multiplanar CT image reconstructions of the cervical spine were also generated. RADIATION DOSE REDUCTION: This exam was performed according to the departmental dose-optimization program which includes automated exposure control, adjustment of the mA and/or kV according to patient size and/or use of iterative reconstruction technique. COMPARISON:  MRI head 11/05/2022, CT head 11/04/2022, CT head 04/30/2021 FINDINGS: CT HEAD FINDINGS Brain: No evidence of large-territorial acute infarction. No parenchymal hemorrhage. No mass lesion. No extra-axial  collection. No mass effect or midline shift. No hydrocephalus. Basilar cisterns are patent. Vascular: No hyperdense vessel. Skull: Similar-appearing skull base osseous erosion (3:18). No acute fracture or focal lesion. Sinuses/Orbits: Almost complete opacification of the right maxillary sinus. Mucosal thickening of the left maxillary sinus. Almost complete opacification of the right ethmoid sinus. Mucosal thickening of bilateral frontal sinuses. Mucosal thickening of the sphenoid sinuses with surgical changes and associated persistent similar-appearing osseous erosion of the sphenoid sinus wall posteriorly and the clivus (5:31, 4:36).Paranasal sinuses and mastoid air cells are clear. The orbits are unremarkable. Other: None. CT CERVICAL SPINE FINDINGS Alignment: Mild retrolisthesis of C3 on C4 and C4 on C5. Skull base and vertebrae: Multilevel moderate degenerative changes of the spine with associated multilevel moderate severe osseous neural foraminal stenosis most prominent at the right C6-C7 level. No acute fracture. No aggressive appearing focal osseous lesion or focal pathologic process. Soft tissues and spinal canal: No prevertebral fluid or swelling. No visible canal hematoma. Upper chest: Unremarkable. Other: Atherosclerotic plaque of the carotid arteries within the neck and the branches off of the aortic arch. IMPRESSION: 1. No acute intracranial abnormality.  2. No acute displaced fracture or traumatic listhesis of the cervical spine. 3. Almost complete opacification of the right maxillary and ethmoid sinuses. Mucosal thickening of the left maxillary and bilateral frontal sinuses. Mucosal thickening of the sphenoid sinuses with surgical changes and associated persistent similar-appearing osseous erosion of the sphenoid sinus wall posteriorly, skull base, and clivus. Continued outpatient MRI follow-up recommended. Electronically Signed   By: Iven Finn M.D.   On: 02/04/2023 00:02   CT Cervical Spine  Wo Contrast  Result Date: 02/04/2023 CLINICAL DATA:  Head trauma, minor (Age >= 65y); Neck trauma (Age >= 65y) EXAM: CT HEAD WITHOUT CONTRAST CT CERVICAL SPINE WITHOUT CONTRAST TECHNIQUE: Multidetector CT imaging of the head and cervical spine was performed following the standard protocol without intravenous contrast. Multiplanar CT image reconstructions of the cervical spine were also generated. RADIATION DOSE REDUCTION: This exam was performed according to the departmental dose-optimization program which includes automated exposure control, adjustment of the mA and/or kV according to patient size and/or use of iterative reconstruction technique. COMPARISON:  MRI head 11/05/2022, CT head 11/04/2022, CT head 04/30/2021 FINDINGS: CT HEAD FINDINGS Brain: No evidence of large-territorial acute infarction. No parenchymal hemorrhage. No mass lesion. No extra-axial collection. No mass effect or midline shift. No hydrocephalus. Basilar cisterns are patent. Vascular: No hyperdense vessel. Skull: Similar-appearing skull base osseous erosion (3:18). No acute fracture or focal lesion. Sinuses/Orbits: Almost complete opacification of the right maxillary sinus. Mucosal thickening of the left maxillary sinus. Almost complete opacification of the right ethmoid sinus. Mucosal thickening of bilateral frontal sinuses. Mucosal thickening of the sphenoid sinuses with surgical changes and associated persistent similar-appearing osseous erosion of the sphenoid sinus wall posteriorly and the clivus (5:31, 4:36).Paranasal sinuses and mastoid air cells are clear. The orbits are unremarkable. Other: None. CT CERVICAL SPINE FINDINGS Alignment: Mild retrolisthesis of C3 on C4 and C4 on C5. Skull base and vertebrae: Multilevel moderate degenerative changes of the spine with associated multilevel moderate severe osseous neural foraminal stenosis most prominent at the right C6-C7 level. No acute fracture. No aggressive appearing focal osseous  lesion or focal pathologic process. Soft tissues and spinal canal: No prevertebral fluid or swelling. No visible canal hematoma. Upper chest: Unremarkable. Other: Atherosclerotic plaque of the carotid arteries within the neck and the branches off of the aortic arch. IMPRESSION: 1. No acute intracranial abnormality. 2. No acute displaced fracture or traumatic listhesis of the cervical spine. 3. Almost complete opacification of the right maxillary and ethmoid sinuses. Mucosal thickening of the left maxillary and bilateral frontal sinuses. Mucosal thickening of the sphenoid sinuses with surgical changes and associated persistent similar-appearing osseous erosion of the sphenoid sinus wall posteriorly, skull base, and clivus. Continued outpatient MRI follow-up recommended. Electronically Signed   By: Iven Finn M.D.   On: 02/04/2023 00:02   DG Chest Portable 1 View  Result Date: 02/02/2023 CLINICAL DATA:  Cough, confusion EXAM: PORTABLE CHEST 1 VIEW COMPARISON:  11/04/2022 FINDINGS: Lungs are clear.  No pleural effusion or pneumothorax. The heart is normal in size.  Thoracic aortic atherosclerosis. IMPRESSION: No evidence of acute cardiopulmonary disease. Electronically Signed   By: Julian Hy M.D.   On: 02/02/2023 21:40    PERFORMANCE STATUS (ECOG) : 2 - Symptomatic, <50% confined to bed  Review of Systems Unless otherwise noted, a complete review of systems is negative.  Physical Exam General: NAD Cardiovascular: regular rate and rhythm Pulmonary: clear ant fields Abdomen: soft, nontender, + bowel sounds GU: no suprapubic tenderness Extremities: no edema,  no joint deformities Skin: no rashes Neurological: Weakness but otherwise nonfocal  IMPRESSION: I met with patient to discuss goals.  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  Patient remains quite frail and weak.  At this point, he is not felt to be a candidate for  cancer treatment even in the event of recurrence.  However, patient tells me that he does not want to "just go home and die" and would like to continue current scope of medical treatment with hope of eventual improvement.  He worked this morning with PT with recommendation for rehab.  Patient is on a dysphagia diet with x-ray concerning for possible aspiration pneumonia.  Patient stated that he would adhere to his dysphagia diet if it meant possibility of him getting better.  We discussed CODE STATUS.  Patient stated clearly and repeatedly that he would not want to be resuscitated nor have his life prolonged artificially on machines.  I called and spoke with his stepdaughter, Otila Kluver, who is his HCPOA.  Otila Kluver verbalized understanding that patient is not felt to be a candidate for cancer treatment at this point barring significant improvement in his performance status.  Otila Kluver would like for him to try rehab, although she recognizes that improvement might not occur.  We also discussed the option of hospice.  Would recommend palliative care following him and rehab to assist with possible transition to hospice if needed.  Otila Kluver also verbalized agreement with DNR/DNI.  She states that Mr. Wanger has repeatedly told her in the past that he would not want to be resuscitated.  PLAN: -Continue current scope of treatment -Recommend palliative care following at rehab -DNR/DNI  Case and plan discussed with Dr. Rogue Bussing  Time Total: 60 minutes  Visit consisted of counseling and education dealing with the complex and emotionally intense issues of symptom management and palliative care in the setting of serious and potentially life-threatening illness.Greater than 50%  of this time was spent counseling and coordinating care related to the above assessment and plan.  Signed by: Altha Harm, PhD, NP-C

## 2023-02-08 NOTE — Progress Notes (Signed)
  PROGRESS NOTE    Kenneth Franklin  [id number]:[id number] DOB: 19-Jun-1946 DOA: 02/03/2023 PCP: Kenneth Lamb, NP  125A/125A-AA  LOS: 4 days   Brief hospital course:   Assessment & Plan: Kenneth Franklin is a 77 y.o. male with medical history significant for anxiety, depression, hypertension, dyslipidemia, EtOH abuse and CVA, presented to the emergency room with his 30 with acute left sided weakness that started before midnight, without new numbness or paresthesias.  He denied any dysarthria or expressive dysphasia or dysphagia.  He was having gait abnormality secondary to his weakness.  He later had a fall.    * CVA (cerebral vascular accident) [facility]) - Neurology consult appreciated  - TTE without significant findings - ASA 81 mg PO daily  - Plavix 75 mg PO daily  - Lipitor 80 mg PO daily  - Appreciate oncology assistance/consultation     Cough, leukocytosis Aspiration pneumonia O2 normal, cxr with RLL infiltrate concerning for aspiration. 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 Onc consulted 3/9, advises outpatient w/u, palliative involvement - palliative consulted - close outpt oncology f/u   Dysphagia SLP following --dys 2 with nectar thick   Essential hypertension - Allow permissive HTN    Type 2 diabetes mellitus with complication, without long-term current use of insulin (HCC) --ACHS and SSI   COPD (chronic obstructive pulmonary disease) (HCC) --cont daily Breo   Dyslipidemia --cont Lipitor 80   Anxiety and depression - Cymbalta 30 mg PO daily    GERD without esophagitis - cont PPI   Hypokalemia  --monitor and replete PRN   DVT prophylaxis: Lovenox SQ Code Status: Full code  Family Communication:  Level of care: Telemetry Medical Dispo:   The patient is from: home Anticipated d/c is to: SNF  rehab Anticipated d/c date is: whenever bed available   Subjective and Interval History:  Pt reported doing ok.  No complaint.  Admitted to poor appetite and not eating much, but willing to drink chocolate Ensure.   Objective: Vitals:   02/08/23 0735 02/08/23 1116 02/08/23 1619 02/08/23 1634  BP: 116/69 (!) 124/59 123/79 134/65  Pulse: 75 71 82 68  Resp: 17 17 17 18   Temp: (!) 97.5 F (36.4 C) 97.9 F (36.6 C) (!) 97.5 F (36.4 C) 98.5 F (36.9 C)  TempSrc:      SpO2: 98% 93% 95% 95%  Weight:      Height:        Intake/Output Summary (Last 24 hours) at 02/08/2023 2002 Last data filed at 02/08/2023 0514 Gross per 24 hour  Intake --  Output 1000 ml  Net -1000 ml   Filed Weights   02/04/23 0152  Weight: 51.6 kg    Examination:   Constitutional: NAD, AAOx3 HEENT: conjunctivae and lids normal, EOMI CV: No cyanosis.   RESP: normal respiratory effort Neuro: II - XII grossly intact.   Psych: subdued mood and affect.  Appropriate judgement and reason   Data Reviewed: I have personally reviewed labs and imaging studies  Time spent: 25 minutes  Enzo Bi, MD [facility] If 7PM-7AM, please contact night-coverage 02/08/2023, 8:02 PM

## 2023-02-08 NOTE — Progress Notes (Signed)
Occupational Therapy Treatment Patient Details Name: Kenneth Franklin MRN: [id number] DOB: Jan 25, 1946 Today's Date: 02/08/2023   History of present illness Kenneth Franklin is a 77 Patient is a 77 y.o. male with past medical history including nasopharyngeal cancer s/p chemo and radiation, dysphagia, alcohol abuse, CVA, DM, HTN, HLD, depression, anxiety, who presented 3/7 to ED with left sided weakness and fall; CT head was negative and pt was discharged home. Returned 02/03/23 with worsening weakness, found to have acute CVA, possible recurrence of nasopharyngeal cancer. MRI brain 02/04/23: "Scattered acute infarcts in the right ACA-MCA border zone. No acute  hemorrhage or significant mass effect." CT Angio 02/04/23: "1. Moderate irregular narrowing of the lacerum segment of the right  ICA with surrounding mildly enhancing soft tissue along the posterior sphenoid sinus with associated bony destruction of the posterior wall and clivus. 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 Upon arrival to room pt reclined in chair noted to have bottom on footrest of chair. Pt requires MIN A sit<>stand (anticipate pt would improve if standing from chair with armrests vs standing while seated on footrest). Tolerated ~10 ft mobility, pt reaching out for external support. Provided yellow theraband and exercises reviewed, pt lethargic and does not participate. Pt making good progress toward goals, will continue to follow POC. Discharge recommendation remains appropriate.     Recommendations for follow up therapy are one component of a multi-disciplinary discharge planning process, led by the attending physician.  Recommendations may be updated based on patient status, additional functional criteria and insurance authorization.    Follow Up Recommendations  Skilled nursing-short term rehab (<3 hours/day)     Assistance Recommended at  Discharge Frequent or constant Supervision/Assistance  Patient can return home with the following  A lot of help with bathing/dressing/bathroom;A lot of help with walking and/or transfers;Assist for transportation;Assistance with cooking/housework;Help with stairs or ramp for entrance;Assistance with feeding;Direct supervision/assist for financial management;Direct supervision/assist for medications management   Equipment Recommendations  Other (comment) (defer)    Recommendations for Other Services      Precautions / Restrictions Precautions Precautions: Fall Restrictions Weight Bearing Restrictions: No       Mobility Bed Mobility Overal bed mobility: Needs Assistance Bed Mobility: Sit to Supine       Sit to supine: Min guard        Transfers Overall transfer level: Needs assistance Equipment used: 1 person hand held assist Transfers: Sit to/from Stand Sit to Stand: Min assist           General transfer comment: pt seated on elevated footrest of recliner     Balance Overall balance assessment: Needs assistance Sitting-balance support: No upper extremity supported, Feet supported Sitting balance-Leahy Scale: Fair     Standing balance support: Single extremity supported Standing balance-Leahy Scale: Poor                             ADL either performed or assessed with clinical judgement   ADL Overall ADL's : Needs assistance/impaired                                       General ADL Comments: MIN A simulated toilet t/f, pt reaching out for external support.       Cognition Arousal/Alertness: Awake/alert Behavior  During Therapy: WFL for tasks assessed/performed Overall Cognitive Status: No family/caregiver present to determine baseline cognitive functioning Area of Impairment: Following commands, Safety/judgement                       Following Commands: Follows one step commands inconsistently Safety/Judgement:  Decreased awareness of safety     General Comments: accurately states balance is greater limiting factor than strength        Exercises Other Exercises Other Exercises: provided yellow theraband and exercises reviewed, pt lethargic and does not participate    Pertinent Vitals/ Pain       Pain Assessment Pain Assessment: No/denies pain   Frequency  Min 2X/week        Progress Toward Goals  OT Goals(current goals can now be found in the care plan section)  Progress towards OT goals: Progressing toward goals  Acute Rehab OT Goals Patient Stated Goal: to feel better OT Goal Formulation: With patient Time For Goal Achievement: 02/18/23 Potential to Achieve Goals: Good ADL Goals Pt Will Perform Grooming: with modified independence;standing Pt Will Perform Lower Body Dressing: with modified independence;sit to/from stand Pt Will Transfer to Toilet: with modified independence;ambulating;regular height toilet  Plan Discharge plan remains appropriate;Frequency remains appropriate    Co-evaluation                 AM-PAC OT "6 Clicks" Daily Activity     Outcome Measure   Help from another person eating meals?: A Little Help from another person taking care of personal grooming?: A Little Help from another person toileting, which includes using toliet, bedpan, or urinal?: A Lot Help from another person bathing (including washing, rinsing, drying)?: A Lot Help from another person to put on and taking off regular upper body clothing?: A Little Help from another person to put on and taking off regular lower body clothing?: A Lot 6 Click Score: 15    End of Session    OT Visit Diagnosis: Other abnormalities of gait and mobility (R26.89);Muscle weakness (generalized) (M62.81);Other symptoms and signs involving cognitive function;Hemiplegia and hemiparesis Hemiplegia - Right/Left: Left Hemiplegia - dominant/non-dominant: Non-Dominant Hemiplegia - caused by: Cerebral  infarction   Activity Tolerance Patient tolerated treatment well   Patient Left in bed;with call bell/phone within reach;with bed alarm set;Other (comment) (NP in room)   Nurse Communication          [id number] OT Time Calculation (min): 8 min  Charges: OT General Charges $OT Visit: 1 Visit OT Treatments $Self Care/Home Management : 8-22 mins  Dessie Coma, M.S. OTR/L  02/08/23, 11:14 AM  ascom [phone]

## 2023-02-09 ENCOUNTER — Telehealth: Payer: Self-pay | Admitting: Internal Medicine

## 2023-02-09 ENCOUNTER — Other Ambulatory Visit: Payer: Medicare Other

## 2023-02-09 DIAGNOSIS — L89153 Pressure ulcer of sacral region, stage 3: Secondary | ICD-10-CM | POA: Insufficient documentation

## 2023-02-09 DIAGNOSIS — I69314 Frontal lobe and executive function deficit following cerebral infarction: Secondary | ICD-10-CM | POA: Insufficient documentation

## 2023-02-09 DIAGNOSIS — C119 Malignant neoplasm of nasopharynx, unspecified: Secondary | ICD-10-CM

## 2023-02-09 DIAGNOSIS — M6281 Muscle weakness (generalized): Secondary | ICD-10-CM | POA: Insufficient documentation

## 2023-02-09 DIAGNOSIS — R1312 Dysphagia, oropharyngeal phase: Secondary | ICD-10-CM | POA: Insufficient documentation

## 2023-02-09 DIAGNOSIS — I69391 Dysphagia following cerebral infarction: Secondary | ICD-10-CM | POA: Insufficient documentation

## 2023-02-09 DIAGNOSIS — I69398 Other sequelae of cerebral infarction: Secondary | ICD-10-CM | POA: Insufficient documentation

## 2023-02-09 DIAGNOSIS — Z9181 History of falling: Secondary | ICD-10-CM | POA: Insufficient documentation

## 2023-02-09 LAB — GLUCOSE, CAPILLARY: Glucose-Capillary: 134 mg/dL — ABNORMAL HIGH (ref 70–99)

## 2023-02-09 MED ORDER — NICOTINE 14 MG/24HR TD PT24: 14.0000 mg | MEDICATED_PATCH | Freq: Every day | TRANSDERMAL | 0 refills | Status: DC

## 2023-02-09 MED ORDER — POLYETHYLENE GLYCOL 3350 17 GM/SCOOP PO POWD: 17.0000 g | Freq: Two times a day (BID) | ORAL | 0 refills | Status: DC

## 2023-02-09 MED ORDER — LINACLOTIDE 72 MCG PO CAPS: ORAL_CAPSULE | ORAL | Status: DC

## 2023-02-09 MED ORDER — ATORVASTATIN CALCIUM 80 MG PO TABS: 80.0000 mg | ORAL_TABLET | Freq: Every day | ORAL | Status: DC

## 2023-02-09 MED ORDER — OXYCODONE HCL 5 MG PO TABS: 5.0000 mg | ORAL_TABLET | Freq: Two times a day (BID) | ORAL | 0 refills | Status: DC | PRN

## 2023-02-09 NOTE — Progress Notes (Signed)
Physical Therapy Treatment Patient Details Name: Kenneth Franklin MRN: [id number] DOB: [birth year] Today's Date: 02/09/2023   History of Present Illness Kenneth Franklin is a 76 Patient is a 77 y.o. male with past medical history including nasopharyngeal cancer s/p chemo and radiation, dysphagia, alcohol abuse, CVA, DM, HTN, HLD, depression, anxiety, who presented 3/7 to ED with left sided weakness and fall; CT head was negative and pt was discharged home. Returned 02/03/23 with worsening weakness, found to have acute CVA, possible recurrence of nasopharyngeal cancer. MRI brain 02/04/23: "Scattered acute infarcts in the right ACA-MCA border zone. No acute  hemorrhage or significant mass effect." CT Angio 02/04/23: "1. Moderate irregular narrowing of the lacerum segment of the right  ICA with surrounding mildly enhancing soft tissue along the posterior sphenoid sinus with associated bony destruction of the posterior wall and clivus. 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 He continues to require physical assistance with bed mobility. Fair sitting balance with poor posture, flexed while sitting. Patient is fatigued with minimal activity and refused standing/getting to chair, or ambulation. Recommend to continue PT to maximize independence and decrease caregiver burden.    Recommendations for follow up therapy are one component of a multi-disciplinary discharge planning process, led by the attending physician.  Recommendations may be updated based on patient status, additional functional criteria and insurance authorization.  Follow Up Recommendations  Skilled nursing-short term rehab (<3 hours/day) Can patient physically be transported by private vehicle: No   Assistance Recommended at Discharge Frequent or constant Supervision/Assistance  Patient can return home with the following Two  people to help with walking and/or transfers;Direct supervision/assist for medications management;Direct supervision/assist for financial management;Assistance with cooking/housework;Assistance with feeding;A lot of help with bathing/dressing/bathroom   Equipment Recommendations  None recommended by PT    Recommendations for Other Services       Precautions / Restrictions Precautions Precautions: Fall Restrictions Weight Bearing Restrictions: No     Mobility  Bed Mobility Overal bed mobility: Needs Assistance Bed Mobility: Supine to Sit, Sit to Supine     Supine to sit: Min assist Sit to supine: Min guard   General bed mobility comments: verbal cues for task initiation. patient needs encouragement to participate    Transfers                   General transfer comment: patient refsued to stand    Ambulation/Gait                   Stairs             Wheelchair Mobility    Modified Rankin (Stroke Patients Only)       Balance Overall balance assessment: Needs assistance Sitting-balance support: Feet supported Sitting balance-Leahy Scale: Fair Sitting balance - Comments: flexed posture, fatigued with minimal activity                                    Cognition Arousal/Alertness: Awake/alert Behavior During Therapy: WFL for tasks assessed/performed Overall Cognitive Status: No family/caregiver present to determine baseline cognitive functioning Area of Impairment: Following commands, Safety/judgement                       Following Commands: Follows one step commands with increased time Safety/Judgement: Decreased awareness of  deficits              Exercises      General Comments General comments (skin integrity, edema, etc.): patient had several bites of breakfast and sips of thickened orange juice during session.      Pertinent Vitals/Pain Pain Assessment Pain Assessment: No/denies pain    Home  Living                          Prior Function            PT Goals (current goals can now be found in the care plan section) Acute Rehab PT Goals Patient Stated Goal: to get rest PT Goal Formulation: With patient Time For Goal Achievement: 02/19/23 Potential to Achieve Goals: Fair Progress towards PT goals: Progressing toward goals    Frequency    Min 2X/week      PT Plan Current plan remains appropriate    Co-evaluation              AM-PAC PT "6 Clicks" Mobility   Outcome Measure  Help needed turning from your back to your side while in a flat bed without using bedrails?: A Little Help needed moving from lying on your back to sitting on the side of a flat bed without using bedrails?: A Little Help needed moving to and from a bed to a chair (including a wheelchair)?: A Lot Help needed standing up from a chair using your arms (e.g., wheelchair or bedside chair)?: A Lot Help needed to walk in hospital room?: A Lot Help needed climbing 3-5 steps with a railing? : Total 6 Click Score: 13    End of Session   Activity Tolerance: Patient limited by fatigue Patient left: in bed;with call bell/phone within reach;with bed alarm set   PT Visit Diagnosis: Unsteadiness on feet (R26.81);Other abnormalities of gait and mobility (R26.89);Muscle weakness (generalized) (M62.81);Difficulty in walking, not elsewhere classified (R26.2);Other symptoms and signs involving the nervous system (R29.898);Pain     Time: [id number] PT Time Calculation (min) (ACUTE ONLY): 14 min  Charges:  $Therapeutic Activity: 8-22 mins                     Minna Merritts, PT, MPT    Percell Locus 02/09/2023, 10:49 AM

## 2023-02-09 NOTE — Discharge Summary (Signed)
Physician Discharge Summary   Kenneth Franklin  male DOB: 12-31-1945  [id number]  PCP: Verita Lamb, NP  Admit date: 02/03/2023 Discharge date: 02/09/2023  Admitted From: home Disposition:  SNF rehab CODE STATUS: DNR  Discharge Instructions     Discharge wound care:   Complete by: As directed    Wound care to stage 3 PI to sacrum:  Cleanse with NS, pat dry. Cover with size appropriate piece of silver hydrofiber (Aquacel Ag+ Advantage, Kellie Simmering # [id number]). Top with silicone foam to sacrum oriented with the tip pointing away from the rectum. Change daily. [facility] Course:  For full details, please see H&P, progress notes, consult notes and ancillary notes.  Briefly,  Kenneth Franklin is a 77 y.o. 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  He denied any dysarthria or expressive dysphasia or dysphagia.  He was having gait abnormality secondary to his weakness.  He later had a fall.    * CVA (cerebral vascular accident) ([place]) --MRI brain showed "Scattered acute infarcts in the right ACA-MCA border zone." - TTE without significant findings.  LDL 134. --cont home ASA 81 and plavix indefinitely. --Lipitor increased from 20 mg daily to 80 mg daily for goal LDL <70. - Per Neuro, "Right MCA territory stroke in the setting of right ICA radiation-induced vasculopathy versus direct tumor spread along the right carotid canal."  See below for further plan for determination for local cancer recurrence.  # History of nasopharyngeal cancer -concern for local recurrence involving right side of the neck and also destruction of the posterior wall/clivus.  --Per Onc, "Radiology recommended further characterization with MRI face with and  without contrast. MRI brain performed was heavily motion degraded due to delirium. Patient is at high risk to have sedation to undergo MRI. Would wait for delirium to clear up and can consider further imaging as outpatient. Patient will also benefit from PET CT scan for restaging. This can also be performed as outpatient."   --outpatient f/u with Dr. Rogue Bussing --Neuro requested to "include MRI brain with and without contrast if MRI face with and without contrast is pursued."   # Delirium --multifactorial, from stroke, hospital delirium.   Cough, leukocytosis Possible Aspiration pneumonia O2 normal, cxr with RLL infiltrate concerning for aspiration. Covid/flu neg --pt received 3 days of ceftriaxone f/b 3 days of Unasyn.   Dysphagia SLP evaluated  --dys 2 with nectar thick   Essential hypertension --pt was not taking losartan or other BP medication PTA.  BP has been mostly wnl during hospitalization.   Type 2 diabetes mellitus with complication, without long-term current use of insulin (HCC) --A1c 6.7, well controlled. --received ACHS and SSI while inpatient. --resume home glipizide after discharge.   COPD (chronic obstructive pulmonary disease) (HCC) --stable   Dyslipidemia --Lipitor increased from 20 mg daily to 80 mg daily for goal LDL <70.   Anxiety and depression - Cymbalta 30 mg PO daily    GERD without esophagitis - cont PPI and Pepcid   Hypokalemia  --monitored and repleted PRN  Chronic opioids use --cont home oxycodone 5 mg BID PRN --hold home Linzess while in SNF and use Miralax BID scheduled for bowel regimen.  Pressure Injury 02/04/23 Sacrum Medial Stage 3, POA   Unless noted above, medications under "  STOP" list are ones pt was not taking PTA.  Discharge Diagnoses:  Principal Problem:   CVA (cerebral vascular accident) [facility]) Active Problems:   Essential hypertension   Type 2 diabetes mellitus with complication, without long-term current use of insulin  (HCC)   Dyslipidemia   COPD (chronic obstructive pulmonary disease) (HCC)   Nasopharyngeal cancer (HCC)   Anxiety and depression   GERD without esophagitis   Pressure injury of skin   30 Day Unplanned Readmission Risk Score    Flowsheet Row ED to Hosp-Admission (Current) from 02/03/2023 in [facility]  30 Day Unplanned Readmission Risk Score (%) 24.14 Filed at 02/09/2023 0801       This score is the patient's risk of an unplanned readmission within 30 days of being discharged (0 -100%). The score is based on dignosis, age, lab data, medications, orders, and past utilization.   Low:  0-14.9   Medium: 15-21.9   High: 22-29.9   Extreme: 30 and above         Discharge Instructions:  Allergies as of 02/09/2023       Reactions   Propoxyphene    Unknown reaction        Medication List     STOP taking these medications    escitalopram 20 MG tablet Commonly known as: LEXAPRO   fluticasone 50 MCG/ACT nasal spray Commonly known as: FLONASE   losartan 100 MG tablet Commonly known as: COZAAR       TAKE these medications    acetaminophen 325 MG tablet Commonly known as: TYLENOL Take 975 mg by mouth every 6 (six) hours as needed.   Advair HFA 115-21 MCG/ACT inhaler Generic drug: fluticasone-salmeterol Inhale 1 puff into the lungs 2 (two) times daily.   albuterol (2.5 MG/3ML) 0.083% nebulizer solution Commonly known as: PROVENTIL   Albuterol Sulfate (sensor) 108 (90 Base) MCG/ACT Aepb Inhale 1-2 puffs into the lungs every 6 (six) hours as needed (Wheezing or Shortness of breath).   aspirin EC 81 MG tablet Take 81 mg by mouth daily. Swallow whole.   atorvastatin 80 MG tablet Commonly known as: LIPITOR Take 1 tablet (80 mg total) by mouth daily. What changed:  medication strength how much to take   clopidogrel 75 MG tablet Commonly known as: PLAVIX Take 75 mg by mouth every morning.   DULoxetine 30 MG  capsule Commonly known as: CYMBALTA Take 30 mg by mouth daily.   famotidine 40 MG tablet Commonly known as: PEPCID Take 40 mg by mouth daily.   feeding supplement Liqd Take 237 mLs by mouth 3 (three) times daily between meals.   folic acid 1 MG tablet Commonly known as: FOLVITE Take 1 mg by mouth daily.   glipiZIDE 5 MG tablet Commonly known as: GLUCOTROL Take 1 tablet by mouth daily.   linaclotide 72 MCG capsule Commonly known as: Linzess Hold while in nursing facility. What changed:  how much to take how to take this when to take this additional instructions   multivitamin with minerals Tabs tablet Take 1 tablet by mouth daily.   nicotine 14 mg/24hr patch Commonly known as: NICODERM CQ - dosed in mg/24 hours Place 1 patch (14 mg total) onto the skin daily.   ondansetron 4 MG disintegrating tablet Commonly known as: ZOFRAN-ODT Take 8 mg by mouth every 8 (eight) hours as needed. What changed: Another medication with the same name was removed. Continue taking this medication, and follow the directions you see here.  oxyCODONE 5 MG immediate release tablet Commonly known as: Oxy IR/ROXICODONE Take 1 tablet (5 mg total) by mouth 2 (two) times daily as needed. Home med. What changed:  additional instructions Another medication with the same name was removed. Continue taking this medication, and follow the directions you see here.   pantoprazole 40 MG tablet Commonly known as: PROTONIX Take 40 mg by mouth daily.   pilocarpine 5 MG tablet Commonly known as: SALAGEN Take 5 mg by mouth 3 (three) times daily.   polyethylene glycol powder 17 GM/SCOOP powder Commonly known as: GLYCOLAX/MIRALAX Take 17 g by mouth in the morning and at bedtime. What changed:  how much to take when to take this   polyvinyl alcohol 1.4 % ophthalmic solution Commonly known as: LIQUIFILM TEARS Apply to eye.   QC Vitamin D3 50 MCG (2000 UT) Caps Generic drug: Cholecalciferol Take  2,000 Units by mouth daily.   sodium chloride 0.65 % nasal spray Commonly known as: OCEAN Place into the nose.               Discharge Care Instructions  (From admission, onward)           Start     Ordered   02/09/23 0000  Discharge wound care:       Comments: Wound care to stage 3 PI to sacrum:  Cleanse with NS, pat dry. Cover with size appropriate piece of silver hydrofiber (Aquacel Ag+ Advantage, Kellie Simmering # [id number]). Top with silicone foam to sacrum oriented with the tip pointing away from the rectum. Change daily. - -   02/09/23 [id number]              Allergies  Allergen Reactions   Propoxyphene     Unknown reaction     The results of significant diagnostics from this hospitalization (including imaging, microbiology, ancillary and laboratory) are listed below for reference.   Consultations:   Procedures/Studies: DG Swallowing Func-Speech Pathology  Result Date: 02/07/2023 Table formatting from the original result was not included. Modified Barium Swallow Study Patient Details Name: Kenneth Franklin MRN: [id number] Date of Birth: [birth year] Today's Date: 02/07/2023 HPI/PMH: HPI: Patient is a 77 y.o. ale w/ past medical history including nasopharyngeal cancer s/p chemo and radiation, malnutrition, dysphagia, alcohol abuse, CVA, DM, HTN, HLD, depression, anxiety, who presented 3/7 to ED with left sided weakness and fall; CT head was negative and pt was discharged home. Returned 02/03/23 with worsening weakness, found to have acute CVA, possible recurrence of nasopharyngeal cancer(?).    MRI brain 02/04/23: "Scattered acute infarcts in the right ACA-MCA border zone. No acute  hemorrhage or significant mass effect." CT Angio 02/04/23: "1. Moderate irregular narrowing of the lacerum segment of the right  ICA with surrounding mildly enhancing soft tissue along the posterior sphenoid sinus with associated bony destruction of the posterior wall and clivus. Findings are concerning for  recurrent nasopharyngeal carcinoma with either radiation induced vasculopathy or direct tumor spread along the right carotid canal. Consider further characterization with sedated MRI of the face with and  without contrast. 2. 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 3. 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 4. Aortic Atherosclerosis".   CXR this admit: No evidence of acute cardiopulmonary disease.  CT of cervical spine: Chest "Unremarkable". Clinical Impression: Clinical Impression: Patient presents w/ what appears to be moderate, possibly chronic, oropharyngeal dysphagia w/ contributing factors including overall weakness and malnutrition, sensorimotor deficits, ETOH abuse, dysphagia, and h/o XRT for  nasophayrngeal cancer. This presentation can increase risk for dysphagia and aspiration during oral intake. Also suspect some degree of Cognitive-communication deficit.  Oral phase is characterized by min disorganized lingual manipulation/control and weakness, disorganized lingual transport and lingual (and occasional lateral sulci) residue post-swallow. Pt tended to take a large bolus sip (despite verbal cues NOT to) when feeding self (thin liquid via Cup) and piecemealed(3-4x) to clear the amount taken. Swallow initiation was delayed to the level of the pyriform sinuses w/ thin liquids; more superior to the vallecule w/ Nectar liquids in controlled sips.  Pharyngeal phase was noted for decreased base of tongue retraction and decreased strength and reduced stripping wave leading to moderate residue collection in the valleculae, BOT, slight amount in the pyriform sinuses, and min amount along the posterior pharyngeal wall. Trace laryngeal penetration occured x2 w/ thin liquids (trace-slight coating along the underside of the epiglottis); slightly reduced airway closure during the swallow w/ thin liquid trials d/t the delayed initiation. No aspiration occurred. No laryngeal penetration  nor aspiration noted w/ any other consistency. More timely airway closure noted w/ Nectar liquids in controlled sips. Subsequent swallows were marginally effective in reducing oropharyngeal residue, however, when combined w/ alternating w/ sip of Nectar liquid, oropharyngeal clearance improved significantly, with minimal residue remaining in the valleculae and along BOT.  Pt required feeding support and MOD cues in setting of decreased overall awareness and ease of distraction. Factors that may increase risk of adverse event in presence of aspiration ([place] 2021): Factors that may increase risk of adverse event in presence of aspiration ([place] 2021): Frail or deconditioned; Poor general health and/or compromised immunity; Respiratory or GI disease; Limited mobility; Reduced cognitive function; Dependence for feeding and/or oral hygiene Recommendations/Plan: Swallowing Evaluation Recommendations Swallowing Evaluation Recommendations Recommendations: PO diet PO Diet Recommendation: Dysphagia 2 (Finely chopped); Mildly thick liquids (Level 2, nectar thick) Liquid Administration via: Cup; Spoon Medication Administration: Crushed with puree Supervision: Full assist for feeding; Full supervision/cueing for swallowing strategies Swallowing strategies  : Minimize environmental distractions; Slow rate; Small bites/sips; Multiple dry swallows after each bite/sip; Follow solids with liquids; Clear throat intermittently Postural changes: Position pt fully upright for meals; Stay upright 30-60 min after meals Oral care recommendations: Oral care QID (4x/day); Oral care before PO; Staff/trained caregiver to provide oral care Recommended consults: Consider Palliative care; Consider dietitian consultation Caregiver Recommendations: Avoid jello, ice cream, thin soups, popsicles; Remove water pitcher; Have oral suction available Treatment Plan Treatment Plan Treatment recommendations: Therapy as outlined in  treatment plan below Follow-up recommendations: Skilled nursing-short term rehab (<3 hours/day) Recommendations Comment: ongoing education/assessment Functional status assessment: Patient has had a recent decline in their functional status and/or demonstrates limited ability to make significant improvements in function in a reasonable and predictable amount of time. Treatment frequency: Min 2x/week Treatment duration: 2 weeks Interventions: Aspiration precaution training; Compensatory techniques; Patient/family education; Trials of upgraded texture/liquids; Diet toleration management by SLP Recommendations Recommendations for follow up therapy are one component of a multi-disciplinary discharge planning process, led by the attending physician.  Recommendations may be updated based on patient status, additional functional criteria and insurance authorization. Assessment: Orofacial Exam: Orofacial Exam Oral Cavity: Oral Hygiene: -- (Dry) Oral Cavity - Dentition: Poor condition; Missing dentition (some anterior present) Oral Motor/Sensory Function: WFL (no unilateral lingual/facial weakness noted) Anatomy: Anatomy: Other (Comment) (apparent phlegm noted in subglottic area) Thin Liquids: Thin Liquids (Level 0) Thin Liquids : Impaired Bolus delivery method: Spoon; Cup Thin Liquid - Impairment:  Oral Impairment; Pharyngeal impairment Lip Closure: No labial escape Tongue control during bolus hold: Posterior escape of less than half of bolus; Escape to lateral buccal cavity/floor of mouth Bolus transport/lingual motion: Repetitive/disorganized tongue motion Oral residue: Residue collection on oral structures Location of oral residue : Tongue; Floor of mouth (min) Initiation of swallow : Pyriform sinuses Soft palate elevation: No bolus between soft palate (SP)/pharyngeal wall (PW) Laryngeal elevation: Partial superior movement of thyroid cartilage/partial approximation of arytenoids to epiglottic petiole Anterior hyoid  excursion: Partial Epiglottic movement: Partial Laryngeal vestibule closure: Incomplete, narrow column air/contrast in laryngeal vestibule Pharyngeal stripping wave : Present - diminished Pharyngeal contraction (A/P view only): N/A Pharyngoesophageal segment opening: Complete distension and complete duration, no obstruction of flow Tongue base retraction: Narrow column of contrast or air between tongue base and PPW Pharyngeal residue: Collection of residue within or on pharyngeal structures Location of pharyngeal residue: Tongue base; Valleculae; Pharyngeal wall Penetration/Aspiration Scale (PAS) score: 2.  Material enters airway, remains ABOVE vocal cords then ejected out  Mildly Thick Liquids: Mildly thick liquids (Level 2, nectar thick) Mildly thick liquids (Level 2, nectar thick): Impaired Bolus delivery method: Spoon Mildly Thick Liquid - Impairment: Oral Impairment; Pharyngeal impairment Lip Closure: No labial escape Tongue control during bolus hold: Posterior escape of less than half of bolus Bolus transport/lingual motion: Brisk tongue motion Oral residue: Residue collection on oral structures Location of oral residue : Tongue Initiation of swallow : Posterior laryngeal surface of the epiglottis Soft palate elevation: No bolus between soft palate (SP)/pharyngeal wall (PW) Laryngeal elevation: Partial superior movement of thyroid cartilage/partial approximation of arytenoids to epiglottic petiole Anterior hyoid excursion: Partial Epiglottic movement: Partial Laryngeal vestibule closure: Complete, no air/contrast in laryngeal vestibule Pharyngeal stripping wave : Present - diminished Pharyngeal contraction (A/P view only): N/A Pharyngoesophageal segment opening: Complete distension and complete duration, no obstruction of flow Tongue base retraction: Narrow column of contrast or air between tongue base and PPW Pharyngeal residue: Collection of residue within or on pharyngeal structures Location of pharyngeal  residue: Valleculae; Tongue base; Pharyngeal wall Penetration/Aspiration Scale (PAS) score: 1.  Material does not enter airway  Moderately Thick Liquids: Moderately thick liquids (Level 3, honey thick) Moderately thick liquids (Level 3, honey thick): Impaired (same as mildly thick liquid)  Puree: Puree Puree: Impaired Puree - Impairment: Oral Impairment; Pharyngeal impairment Lip Closure: No labial escape Bolus transport/lingual motion: Repetitive/disorganized tongue motion Oral residue: Residue collection on oral structures Location of oral residue : Tongue Initiation of swallow: Valleculae Soft palate elevation: No bolus between soft palate (SP)/pharyngeal wall (PW) Laryngeal elevation: Partial superior movement of thyroid cartilage/partial approximation of arytenoids to epiglottic petiole Anterior hyoid excursion: Partial Epiglottic movement: Partial Laryngeal vestibule closure: Complete, no air/contrast in laryngeal vestibule Pharyngeal stripping wave : Present - diminished Pharyngeal contraction (A/P view only): N/A Pharyngoesophageal segment opening: Complete distension and complete duration, no obstruction of flow Tongue base retraction: Narrow column of contrast or air between tongue base and PPW Pharyngeal residue: Collection of residue within or on pharyngeal structures Location of pharyngeal residue: Tongue base; Valleculae; Pharyngeal wall Penetration/Aspiration Scale (PAS) score: 1.  Material does not enter airway Solid: Solid Solid: Impaired Solid - Impairment: Oral Impairment; Pharyngeal impairment Lip Closure: No labial escape Bolus preparation/mastication: Disorganized chewing/mashing with solid pieces of bolus unchewed Bolus transport/lingual motion: Brisk tongue motion Oral residue: Residue collection on oral structures Location of oral residue : Tongue Initiation of swallow: Valleculae Soft palate elevation: No bolus between soft palate (SP)/pharyngeal wall (PW) Laryngeal  elevation: Partial  superior movement of thyroid cartilage/partial approximation of arytenoids to epiglottic petiole Anterior hyoid excursion: Partial Epiglottic movement: Partial Laryngeal vestibule closure: Complete, no air/contrast in laryngeal vestibule Pharyngeal stripping wave : Present - diminished Pharyngeal contraction (A/P view only): N/A Pharyngoesophageal segment opening: Complete distension and complete duration, no obstruction of flow Tongue base retraction: Narrow column of contrast or air between tongue base and PPW Pharyngeal residue: Collection of residue within or on pharyngeal structures Location of pharyngeal residue: Tongue base; Pharyngeal wall; Valleculae Penetration/Aspiration Scale (PAS) score: 1.  Material does not enter airway Pill: Pill Pill: Not Tested Compensatory Strategies: Compensatory Strategies Compensatory strategies: Yes Other(comment): Ineffective (f/u, Dry swallow to aid clearing oropharynx - minimally effective)   General Information: Caregiver present: No  Diet Prior to this Study: NPO   Temperature : Normal (wbc elevated)   Respiratory Status: WFL   Supplemental O2: None (Room air)   History of Recent Intubation: No  Behavior/Cognition: Alert; Cooperative; Pleasant mood; Requires cueing; Distractible Self-Feeding Abilities: Able to self-feed; Needs assist with self-feeding Baseline vocal quality/speech: Normal Volitional Cough: Able to elicit Volitional Swallow: Able to elicit Exam Limitations: No limitations Goal Planning: Prognosis for improved oropharyngeal function: Fair Barriers to Reach Goals: Cognitive deficits; Time post onset; Severity of deficits; Overall medical prognosis Barriers/Prognosis Comment: see medical history Patient/Family Stated Goal: "lie down" Consulted and agree with results and recommendations: Patient; Physician; Nurse; Dietitian Pain: Pain Assessment Pain Assessment: Faces Pain Score: 7 Body Language: 1 Pain Location: R shoulder and chest. Pain Intervention(s):  Monitored during session; Patient requesting pain meds-RN notified End of Session: Start Time:SLP Start Time (ACUTE ONLY): [id number] Stop Time: SLP Stop Time (ACUTE ONLY): 1630 Time Calculation:SLP Time Calculation (min) (ACUTE ONLY): 75 min Charges: SLP Evaluations $ SLP Speech Visit: 1 Visit SLP Evaluations $BSS Swallow: 1 Procedure $MBS Swallow: 1 Procedure $ SLP EVAL LANGUAGE/SOUND PRODUCTION: 1 Procedure $Swallowing Treatment: 1 Procedure SLP visit diagnosis: SLP Visit Diagnosis: Dysphagia, oropharyngeal phase (R13.12); Cognitive communication deficit (R41.841) Past Medical History: Past Medical History: Diagnosis Date  Acute ischemic stroke ([place]) 2017  Alcohol abuse   Anxiety   Cancer ([place])   Depression   Diabetes mellitus without complication ([place])   Dyspnea   pcp knows and ordered rescue inhaler  Hyperlipidemia   Hypertension   Skin cancer   Squamous Cell Carcinoma In Situ Past Surgical History: Past Surgical History: Procedure Laterality Date  CATARACT EXTRACTION Bilateral   COLONOSCOPY    ESOPHAGOGASTRODUODENOSCOPY (EGD) WITH PROPOFOL N/A 09/26/2022  Procedure: ESOPHAGOGASTRODUODENOSCOPY (EGD) WITH PROPOFOL;  Surgeon: Lesly Rubenstein, MD;  Location: [facility] ENDOSCOPY;  Service: Endoscopy;  Laterality: N/A;  NASOPHARYNGOSCOPY N/A 08/12/2020  Procedure: ENDOSCOPIC NASOPHARYNGOSCOPY WITH BIOPSY;  Surgeon: Margaretha Sheffield, MD;  Location: [facility] ORS;  Service: ENT;  Laterality: N/A;  PORTA CATH INSERTION N/A 08/24/2020  Procedure: PORTA CATH INSERTION;  Surgeon: Algernon Huxley, MD;  Location: [place] [facility];  Service: Cardiovascular;  Laterality: N/A;  PORTA CATH REMOVAL N/A 07/22/2021  Procedure: PORTA CATH REMOVAL;  Surgeon: Algernon Huxley, MD;  Location: [place] [facility];  Service: Cardiovascular;  Laterality: N/A; [name], MS, CCC-SLP Speech Language Pathologist Rehab Services; [place] [phone] (ascom) Watson,Katherine 02/07/2023, 8:10 AM  CT HEAD WO CONTRAST (5MM)  Result Date:  02/06/2023 CLINICAL DATA:  Provided history: Fall.  Acute left-sided weakness. EXAM: CT HEAD WITHOUT CONTRAST TECHNIQUE: Contiguous axial images were obtained from the base of the skull through the vertex without intravenous contrast. RADIATION DOSE REDUCTION:  This exam was performed according to the departmental dose-optimization program which includes automated exposure control, adjustment of the mA and/or kV according to patient size and/or use of iterative reconstruction technique. COMPARISON:  Noncontrast head CT and CT angiogram head/neck 02/04/2023. Brain MRI 02/04/2023. FINDINGS: Brain: Cerebral and cerebellar atrophy. [place] patchy acute cortical and subcortical infarcts within the right MCA vascular territory, as well as right MCA/ACA and MCA/PCA watershed territories, many of which were better delineated on the brain MRI of 02/04/2023. No significant mass effect. No evidence of hemorrhagic conversion. Background advanced patchy and ill-defined hypoattenuation within the cerebral white matter, nonspecific but compatible with chronic small vessel ischemic disease. No extra-axial fluid collection. No evidence of an intracranial mass. No midline shift. Vascular: No hyperdense vessel.  Atherosclerotic calcifications. Skull: No fracture or aggressive osseous lesion. Sinuses/Orbits: No mass or acute finding within the imaged orbits. Postsurgical appearance of the paranasal sinuses. Complete opacification of the right maxillary sinus at the imaged levels. Mild mucosal thickening within the left maxillary sinus at the imaged levels. Small volume frothy secretions within the left sphenoid sinus. Extensive opacification of right ethmoid air cells. Other: Small-volume fluid within the bilateral mastoid air cells. 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 IMPRESSION: 1. 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 No significant mass effect. No evidence of hemorrhagic conversion. 2. Background parenchymal atrophy and advanced chronic small vessel ischemic disease. 3. 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 4. Paranasal sinus disease, as described. 5. Small-volume fluid within the bilateral mastoid air cells. Electronically Signed   By: Kellie Simmering D.O.   On: 02/06/2023 16:17   DG Chest Port 1 View  Result Date: 02/06/2023 CLINICAL DATA:  Leukocytosis, gait abnormality EXAM: PORTABLE CHEST 1 VIEW COMPARISON:  02/02/2023 FINDINGS: 2 frontal views of the chest demonstrate a stable cardiac silhouette. There is developing airspace disease within the right infrahilar region. No effusion or pneumothorax. No acute bony abnormalities. IMPRESSION: 1. Developing right infrahilar airspace disease which could reflect pneumonia or aspiration. Electronically Signed   By: Randa Ngo M.D.   On: 02/06/2023 15:50   DG Pelvis Portable  Result Date: 02/06/2023 CLINICAL DATA:  [place], gait abnormality EXAM: PORTABLE PELVIS 1-2 VIEWS COMPARISON:  08/16/2022 FINDINGS: Supine frontal view of the pelvis includes both hips. There are no acute displaced fractures. Alignment is anatomic. Symmetrical bilateral hip osteoarthritis. The remainder of the bony pelvis is unremarkable. Extensive atherosclerosis of the aorta and iliac vessels. Degenerative changes of the lower lumbar spine. IMPRESSION: 1. No acute displaced fracture. 2. Stable bilateral symmetrical hip osteoarthritis. Electronically Signed   By: Randa Ngo M.D.   On: 02/06/2023 15:49   ECHOCARDIOGRAM COMPLETE BUBBLE STUDY  Result Date: 02/04/2023    ECHOCARDIOGRAM REPORT   Patient Name:   EDMON EVILSIZER Date of Exam: 02/04/2023 Medical Rec #:   [id number]       Height:       72.0 in Accession #:    [id number]      Weight:       113.8 lb Date of Birth:  1946-08-22        BSA:          1.676 m Patient Age:    77 years  BP:           110/72 mmHg Patient Gender: M               HR:           91 bpm. Exam Location:  [facility] Procedure: 2D Echo and Saline Contrast Bubble Study Indications:     Stroke I63.9  History:         Patient has no prior history of Echocardiogram examinations.  Sonographer:     Kathlen Brunswick RDCS Referring Phys:  [id number] [place] Diagnosing Phys: Ida Rogue MD  Sonographer Comments: Technically challenging study due to limited acoustic windows, no parasternal window and no apical window. Image acquisition challenging due to respiratory motion. Limited images were obtained from the subcostal window. IMPRESSIONS  1. Left ventricular ejection fraction, by estimation, is 55 to 60%. The left ventricle has normal function. Challenging images, grossly the left ventricle has no regional wall motion abnormalities. Left ventricular diastolic parameters are consistent with Grade I diastolic dysfunction (impaired relaxation).  2. Right ventricular systolic function is normal. The right ventricular size is normal.  3. The mitral valve is normal in structure. No evidence of mitral valve regurgitation. No evidence of mitral stenosis.  4. The aortic valve has an indeterminant number of cusps. There is moderate calcification of the aortic valve. Aortic valve regurgitation is not visualized. Aortic valve Vmax measures 1.41 m/s. Challenging images, aortic valve gradient not well estimated, degree of stenosis likely underestimated.  5. The inferior vena cava is normal in size with greater than 50% respiratory variability, suggesting right atrial pressure of 3 mmHg.  6. Agitated saline contrast bubble study was negative, with no evidence of any interatrial shunt. FINDINGS  Left Ventricle: Left ventricular ejection fraction, by estimation, is 55 to  60%. The left ventricle has normal function. The left ventricle has no regional wall motion abnormalities. The left ventricular internal cavity size was normal in size. There is  no left ventricular hypertrophy. Left ventricular diastolic parameters are consistent with Grade I diastolic dysfunction (impaired relaxation). Right Ventricle: The right ventricular size is normal. No increase in right ventricular wall thickness. Right ventricular systolic function is normal. Left Atrium: Left atrial size was normal in size. Right Atrium: Right atrial size was normal in size. Pericardium: There is no evidence of pericardial effusion. Mitral Valve: The mitral valve is normal in structure. There is mild calcification of the mitral valve leaflet(s). No evidence of mitral valve regurgitation. No evidence of mitral valve stenosis. Tricuspid Valve: The tricuspid valve is normal in structure. Tricuspid valve regurgitation is mild . No evidence of tricuspid stenosis. Aortic Valve: The aortic valve has an indeterminant number of cusps. There is moderate calcification of the aortic valve. Aortic valve regurgitation is not visualized. Aortic valve peak gradient measures 8.0 mmHg. Pulmonic Valve: The pulmonic valve was normal in structure. Pulmonic valve regurgitation is not visualized. No evidence of pulmonic stenosis. Aorta: The aortic root is normal in size and structure. Venous: The inferior vena cava is normal in size with greater than 50% respiratory variability, suggesting right atrial pressure of 3 mmHg. IAS/Shunts: No atrial level shunt detected by color flow Doppler. Agitated saline contrast was given intravenously to evaluate for intracardiac shunting. Agitated saline contrast bubble study was negative, with no evidence of any interatrial shunt.   Diastology LV e' medial:    5.55 cm/s LV E/e' medial:  11.3 LV e' lateral:   4.35 cm/s LV E/e' lateral: 14.5  AORTIC  VALVE AV Vmax:      141.00 cm/s AV Peak Grad: 8.0 mmHg MITRAL  VALVE MV Area (PHT): 3.50 cm MV Decel Time: 217 msec MV E velocity: 62.90 cm/s MV A velocity: 61.80 cm/s MV E/A ratio:  1.02 Ida Rogue MD Electronically signed by Ida Rogue MD Signature Date/Time: 02/04/2023/11:46:41 AM    Final    CT ANGIO HEAD NECK W WO CM  Result Date: 02/04/2023 CLINICAL DATA:  Neuro deficit, acute, stroke suspected. EXAM: CT HEAD WITHOUT CONTRAST CT ANGIOGRAPHY OF THE HEAD AND NECK TECHNIQUE: Contiguous axial images were obtained from the base of the skull through the vertex without intravenous contrast. Multidetector CT imaging of the head and neck was performed using the standard protocol during bolus administration of intravenous contrast. Multiplanar CT image reconstructions and MIPs were obtained to evaluate the vascular anatomy. Carotid stenosis measurements (when applicable) are obtained utilizing NASCET criteria, using the distal internal carotid diameter as the denominator. RADIATION DOSE REDUCTION: This exam was performed according to the departmental dose-optimization program which includes automated exposure control, adjustment of the mA and/or kV according to patient size and/or use of iterative reconstruction technique. CONTRAST:  38m OMNIPAQUE IOHEXOL 350 MG/ML SOLN COMPARISON:  MRI brain 02/04/2023.  CT head 02/03/2023. FINDINGS: CT HEAD Brain: Subtle loss of gray-white differentiation along the posterior right ACA-MCA border zone, better evaluated on same day brain MRI. No acute intracranial hemorrhage or significant mass effect. Unchanged severe chronic small-vessel disease and moderate generalized volume loss. Vascular: No hyperdense vessel. Skull: No calvarial fracture or suspicious bone lesion. Skull base is unremarkable. Sinuses/Orbits: Chronic right maxillary sinusitis with near-complete opacification of the right ethmoid air cells. Mild mucosal disease of the left maxillary sinus. Orbits are unremarkable. CTA NECK Aortic arch: Three-vessel arch  configuration. Atherosclerotic calcifications of the aortic arch and arch vessel origins. Arch vessel origins are patent. Right carotid system: Predominantly soft plaque along the proximal right cervical ICA results in approximately 60% stenosis by NASCET criteria (axial image 418 series 9). Left carotid system: Calcified plaque along the left carotid bulb and proximal ICA without stenosis. Vertebral arteries:Right dominant vertebral artery with mixed plaque along the right V1 and V2 segments, as well as calcified plaque along the V3 and V4 segments. No significant stenosis or evidence of dissection. At least moderate stenosis of the left vertebral artery origin due to mixed plaque. Skeleton: Cervical spondylosis with mild spinal canal stenosis at C5-6 and C6-7. Other neck: Unremarkable. CTA HEAD Anterior circulation: Moderate irregular narrowing of the lacerum segment of the right ICA with surrounding mildly enhancing soft tissue along the posterior sphenoid sinus with associated bony destruction of the posterior wall and clivus (image 271 series 9). Atherosclerotic calcifications of the left ICA without stenosis. The proximal ACAs and MCAs are patent without stenosis or aneurysm. Distal branches are symmetric. Posterior circulation: Normal basilar artery. The SCAs, AICAs and PICAs are patent proximally. The PCAs are patent proximally without stenosis or aneurysm. Distal branches are symmetric. Venous sinuses: As permitted by early phase of contrast, patent. Anatomic variants: None. IMPRESSION: 1. Moderate irregular narrowing of the lacerum segment of the right ICA with surrounding mildly enhancing soft tissue along the posterior sphenoid sinus with associated bony destruction of the posterior wall and clivus. 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 Consider further characterization with sedated  MRI of the face with and without contrast. 2. 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 3. 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 4. Aortic Atherosclerosis (ICD10-I70.0). Electronically Signed   By: Emmit Alexanders M.D.   On: 02/04/2023 10:39   MR BRAIN WO CONTRAST  Result Date: 02/04/2023 CLINICAL DATA:  Neuro deficit, acute, stroke suspected. EXAM: MRI HEAD WITHOUT CONTRAST TECHNIQUE: Multiplanar, multiecho pulse sequences of the brain and surrounding structures were obtained without intravenous contrast. COMPARISON:  Head CT 02/03/2023.  MRI brain 11/05/2022. FINDINGS: Motion degraded study. Brain: Scattered acute infarcts in the right ACA-MCA border zone. No acute hemorrhage or significant mass effect. Unchanged severe chronic small-vessel disease and generalized volume loss. No acute hydrocephalus or extra-axial collection. Vascular: Limited evaluation due to motion artifact. Skull and upper cervical spine: Normal marrow signal. Sinuses/Orbits: Near-complete opacification of the right maxillary sinus and right ethmoid air cells. Limited evaluation of the orbits due to motion artifact. Other: None. IMPRESSION: Scattered acute infarcts in the right ACA-MCA border zone. No acute hemorrhage or significant mass effect. Electronically Signed   By: Emmit Alexanders M.D.   On: 02/04/2023 10:14   CT HEAD WO CONTRAST (5MM)  Result Date: 02/04/2023 CLINICAL DATA:  Head trauma, minor (Age >= 65y); Neck trauma (Age >= 65y) EXAM: CT HEAD WITHOUT CONTRAST CT CERVICAL SPINE WITHOUT CONTRAST TECHNIQUE: Multidetector CT imaging of the head and cervical spine was performed following the standard protocol without intravenous contrast. Multiplanar CT image reconstructions of the cervical spine were also generated. RADIATION DOSE REDUCTION: This exam was performed according to the departmental dose-optimization program which includes automated exposure control, adjustment of the mA and/or kV  according to patient size and/or use of iterative reconstruction technique. COMPARISON:  MRI head 11/05/2022, CT head 11/04/2022, CT head 04/30/2021 FINDINGS: CT HEAD FINDINGS Brain: No evidence of large-territorial acute infarction. No parenchymal hemorrhage. No mass lesion. No extra-axial collection. No mass effect or midline shift. No hydrocephalus. Basilar cisterns are patent. Vascular: No hyperdense vessel. Skull: Similar-appearing skull base osseous erosion (3:18). No acute fracture or focal lesion. Sinuses/Orbits: Almost complete opacification of the right maxillary sinus. Mucosal thickening of the left maxillary sinus. Almost complete opacification of the right ethmoid sinus. Mucosal thickening of bilateral frontal sinuses. Mucosal thickening of the sphenoid sinuses with surgical changes and associated persistent similar-appearing osseous erosion of the sphenoid sinus wall posteriorly and the clivus (5:31, 4:36).Paranasal sinuses and mastoid air cells are clear. The orbits are unremarkable. Other: None. CT CERVICAL SPINE FINDINGS Alignment: Mild retrolisthesis of C3 on C4 and C4 on C5. Skull base and vertebrae: Multilevel moderate degenerative changes of the spine with associated multilevel moderate severe osseous neural foraminal stenosis most prominent at the right C6-C7 level. No acute fracture. No aggressive appearing focal osseous lesion or focal pathologic process. Soft tissues and spinal canal: No prevertebral fluid or swelling. No visible canal hematoma. Upper chest: Unremarkable. Other: Atherosclerotic plaque of the carotid arteries within the neck and the branches off of the aortic arch. IMPRESSION: 1. No acute intracranial abnormality. 2. No acute displaced fracture or traumatic listhesis of the cervical spine. 3. Almost complete opacification of the right maxillary and ethmoid sinuses. Mucosal thickening of the left maxillary and bilateral frontal sinuses. Mucosal thickening of the sphenoid  sinuses with surgical changes and associated persistent similar-appearing osseous erosion of the sphenoid sinus wall posteriorly, skull base, and clivus. Continued outpatient MRI follow-up recommended. Electronically Signed   By: Iven Finn M.D.   On: 02/04/2023 00:02   CT Cervical Spine Wo Contrast  Result Date: 02/04/2023 CLINICAL  DATA:  Head trauma, minor (Age >= 65y); Neck trauma (Age >= 65y) EXAM: CT HEAD WITHOUT CONTRAST CT CERVICAL SPINE WITHOUT CONTRAST TECHNIQUE: Multidetector CT imaging of the head and cervical spine was performed following the standard protocol without intravenous contrast. Multiplanar CT image reconstructions of the cervical spine were also generated. RADIATION DOSE REDUCTION: This exam was performed according to the departmental dose-optimization program which includes automated exposure control, adjustment of the mA and/or kV according to patient size and/or use of iterative reconstruction technique. COMPARISON:  MRI head 11/05/2022, CT head 11/04/2022, CT head 04/30/2021 FINDINGS: CT HEAD FINDINGS Brain: No evidence of large-territorial acute infarction. No parenchymal hemorrhage. No mass lesion. No extra-axial collection. No mass effect or midline shift. No hydrocephalus. Basilar cisterns are patent. Vascular: No hyperdense vessel. Skull: Similar-appearing skull base osseous erosion (3:18). No acute fracture or focal lesion. Sinuses/Orbits: Almost complete opacification of the right maxillary sinus. Mucosal thickening of the left maxillary sinus. Almost complete opacification of the right ethmoid sinus. Mucosal thickening of bilateral frontal sinuses. Mucosal thickening of the sphenoid sinuses with surgical changes and associated persistent similar-appearing osseous erosion of the sphenoid sinus wall posteriorly and the clivus (5:31, 4:36).Paranasal sinuses and mastoid air cells are clear. The orbits are unremarkable. Other: None. CT CERVICAL SPINE FINDINGS Alignment: Mild  retrolisthesis of C3 on C4 and C4 on C5. Skull base and vertebrae: Multilevel moderate degenerative changes of the spine with associated multilevel moderate severe osseous neural foraminal stenosis most prominent at the right C6-C7 level. No acute fracture. No aggressive appearing focal osseous lesion or focal pathologic process. Soft tissues and spinal canal: No prevertebral fluid or swelling. No visible canal hematoma. Upper chest: Unremarkable. Other: Atherosclerotic plaque of the carotid arteries within the neck and the branches off of the aortic arch. IMPRESSION: 1. No acute intracranial abnormality. 2. No acute displaced fracture or traumatic listhesis of the cervical spine. 3. Almost complete opacification of the right maxillary and ethmoid sinuses. Mucosal thickening of the left maxillary and bilateral frontal sinuses. Mucosal thickening of the sphenoid sinuses with surgical changes and associated persistent similar-appearing osseous erosion of the sphenoid sinus wall posteriorly, skull base, and clivus. Continued outpatient MRI follow-up recommended. Electronically Signed   By: Iven Finn M.D.   On: 02/04/2023 00:02   DG Chest Portable 1 View  Result Date: 02/02/2023 CLINICAL DATA:  Cough, confusion EXAM: PORTABLE CHEST 1 VIEW COMPARISON:  11/04/2022 FINDINGS: Lungs are clear.  No pleural effusion or pneumothorax. The heart is normal in size.  Thoracic aortic atherosclerosis. IMPRESSION: No evidence of acute cardiopulmonary disease. Electronically Signed   By: Julian Hy M.D.   On: 02/02/2023 21:40      Labs: BNP (last 3 results) No results for input(s): "BNP" in the last 8760 hours. Basic Metabolic Panel: Recent Labs  Lab 02/05/23 0701 02/06/23 0609 02/06/23 1251 02/07/23 0525 02/08/23 0647  NA 137 134* 135 136 138  K 4.2 3.8 3.9 3.4* 3.4*  CL 109 105 102 103 107  CO2 22 17* 21* 24 24  GLUCOSE 123* 106* 98 174* 136*  BUN 6* '8 10 18 15  '$ CREATININE 0.61 0.78 0.91 0.90 0.77   CALCIUM 8.3* 8.4* 8.5* 8.3* 8.2*  MG 2.1 1.9  --   --  2.1  PHOS 2.6 3.2  --   --   --    Liver Function Tests: Recent Labs  Lab 02/02/23 2111 02/03/23 2335 02/05/23 0701 02/06/23 0609  AST 20 22 13* 13*  ALT 9 8  8 8  ALKPHOS 156* 174* 145* 145*  BILITOT 0.8 0.7 0.5 1.1  PROT 7.0 7.6 6.1* 6.5  ALBUMIN 3.4* 3.8 3.0* 3.0*   No results for input(s): "LIPASE", "AMYLASE" in the last 168 hours. No results for input(s): "AMMONIA" in the last 168 hours. CBC: Recent Labs  Lab 02/02/23 2111 02/03/23 2335 02/04/23 0551 02/05/23 0701 02/06/23 0609 02/07/23 0525 02/08/23 0647  WBC 10.8* 11.4* 11.7* 10.6* 19.3* 14.8* 9.0  NEUTROABS 9.0* 9.3*  --   --   --   --   --   HGB 13.0 13.1 12.3* 11.4* 12.2* 11.6* 11.1*  HCT 41.7 41.5 38.7* 35.7* 38.0* 36.2* 35.6*  MCV 86.5 87.2 86.4 86.9 86.6 87.0 88.1  PLT 380 415* 331 278 302 299 348   Cardiac Enzymes: No results for input(s): "CKTOTAL", "CKMB", "CKMBINDEX", "TROPONINI" in the last 168 hours. BNP: Invalid input(s): "POCBNP" CBG: Recent Labs  Lab 02/08/23 0736 02/08/23 1131 02/08/23 1616 02/08/23 2025 02/09/23 0834  GLUCAP 126* 178* 129* 149* 134*   D-Dimer No results for input(s): "DDIMER" in the last 72 hours. Hgb A1c No results for input(s): "HGBA1C" in the last 72 hours. Lipid Profile No results for input(s): "CHOL", "HDL", "LDLCALC", "TRIG", "CHOLHDL", "LDLDIRECT" in the last 72 hours. Thyroid function studies No results for input(s): "TSH", "T4TOTAL", "T3FREE", "THYROIDAB" in the last 72 hours.  Invalid input(s): "FREET3" Anemia work up No results for input(s): "VITAMINB12", "FOLATE", "FERRITIN", "TIBC", "IRON", "RETICCTPCT" in the last 72 hours. Urinalysis    Component Value Date/Time   COLORURINE YELLOW (A) 02/04/2023 1440   APPEARANCEUR HAZY (A) 02/04/2023 1440   LABSPEC >1.046 (H) 02/04/2023 1440   PHURINE 6.0 02/04/2023 1440   GLUCOSEU NEGATIVE 02/04/2023 1440   HGBUR LARGE (A) 02/04/2023 1440    BILIRUBINUR NEGATIVE 02/04/2023 1440   KETONESUR NEGATIVE 02/04/2023 1440   PROTEINUR 30 (A) 02/04/2023 1440   NITRITE NEGATIVE 02/04/2023 1440   LEUKOCYTESUR SMALL (A) 02/04/2023 1440   Sepsis Labs Recent Labs  Lab 02/05/23 0701 02/06/23 0609 02/07/23 0525 02/08/23 0647  WBC 10.6* 19.3* 14.8* 9.0   Microbiology Recent Results (from the past 240 hour(s))  Resp panel by RT-PCR (RSV, Flu A&B, Covid) Urine, Catheterized     Status: None   Collection Time: 02/02/23  9:11 PM   Specimen: Urine, Catheterized; Nasal Swab  Result Value Ref Range Status   SARS Coronavirus 2 by RT PCR NEGATIVE NEGATIVE Final    Comment: (NOTE) SARS-CoV-2 target nucleic acids are NOT DETECTED.  The SARS-CoV-2 RNA is generally detectable in upper respiratory specimens during the acute phase of infection. The lowest concentration of SARS-CoV-2 viral copies this assay can detect is 138 copies/mL. A negative result does not preclude SARS-Cov-2 infection and should not be used as the sole basis for treatment or other patient management decisions. A negative result may occur with  improper specimen collection/handling, submission of specimen other than nasopharyngeal swab, presence of viral mutation(s) within the areas targeted by this assay, and inadequate number of viral copies(<138 copies/mL). A negative result must be combined with clinical observations, patient history, and epidemiological information. The expected result is Negative.  Fact Sheet for Patients:  EntrepreneurPulse.com.au  Fact Sheet for Healthcare Providers:  IncredibleEmployment.be  This test is no t yet approved or cleared by the [place] FDA and  has been authorized for detection and/or diagnosis of SARS-CoV-2 by FDA under an Emergency Use Authorization (EUA). This EUA will remain  in effect (meaning this test can be used) for  the duration of the COVID-19 declaration under Section 564(b)(1)  of the Act, 21 U.S.C.section 360bbb-3(b)(1), unless the authorization is terminated  or revoked sooner.       Influenza A by PCR NEGATIVE NEGATIVE Final   Influenza B by PCR NEGATIVE NEGATIVE Final    Comment: (NOTE) The Xpert Xpress SARS-CoV-2/FLU/RSV plus assay is intended as an aid in the diagnosis of influenza from Nasopharyngeal swab specimens and should not be used as a sole basis for treatment. Nasal washings and aspirates are unacceptable for Xpert Xpress SARS-CoV-2/FLU/RSV testing.  Fact Sheet for Patients: EntrepreneurPulse.com.au  Fact Sheet for Healthcare Providers: IncredibleEmployment.be  This test is not yet approved or cleared by the [place] FDA and has been authorized for detection and/or diagnosis of SARS-CoV-2 by FDA under an Emergency Use Authorization (EUA). This EUA will remain in effect (meaning this test can be used) for the duration of the COVID-19 declaration under Section 564(b)(1) of the Act, 21 U.S.C. section 360bbb-3(b)(1), unless the authorization is terminated or revoked.     Resp Syncytial Virus by PCR NEGATIVE NEGATIVE Final    Comment: (NOTE) Fact Sheet for Patients: EntrepreneurPulse.com.au  Fact Sheet for Healthcare Providers: IncredibleEmployment.be  This test is not yet approved or cleared by the [place] FDA and has been authorized for detection and/or diagnosis of SARS-CoV-2 by FDA under an Emergency Use Authorization (EUA). This EUA will remain in effect (meaning this test can be used) for the duration of the COVID-19 declaration under Section 564(b)(1) of the Act, 21 U.S.C. section 360bbb-3(b)(1), unless the authorization is terminated or revoked.  Performed at [facility], [address] [place], [place] [address]   Resp panel by RT-PCR (RSV, Flu A&B, Covid) Anterior Nasal Swab     Status: None   Collection Time: 02/06/23 12:02 PM    Specimen: Anterior Nasal Swab  Result Value Ref Range Status   SARS Coronavirus 2 by RT PCR NEGATIVE NEGATIVE Final    Comment: (NOTE) SARS-CoV-2 target nucleic acids are NOT DETECTED.  The SARS-CoV-2 RNA is generally detectable in upper respiratory specimens during the acute phase of infection. The lowest concentration of SARS-CoV-2 viral copies this assay can detect is 138 copies/mL. A negative result does not preclude SARS-Cov-2 infection and should not be used as the sole basis for treatment or other patient management decisions. A negative result may occur with  improper specimen collection/handling, submission of specimen other than nasopharyngeal swab, presence of viral mutation(s) within the areas targeted by this assay, and inadequate number of viral copies(<138 copies/mL). A negative result must be combined with clinical observations, patient history, and epidemiological information. The expected result is Negative.  Fact Sheet for Patients:  EntrepreneurPulse.com.au  Fact Sheet for Healthcare Providers:  IncredibleEmployment.be  This test is no t yet approved or cleared by the [place] FDA and  has been authorized for detection and/or diagnosis of SARS-CoV-2 by FDA under an Emergency Use Authorization (EUA). This EUA will remain  in effect (meaning this test can be used) for the duration of the COVID-19 declaration under Section 564(b)(1) of the Act, 21 U.S.C.section 360bbb-3(b)(1), unless the authorization is terminated  or revoked sooner.       Influenza A by PCR NEGATIVE NEGATIVE Final   Influenza B by PCR NEGATIVE NEGATIVE Final    Comment: (NOTE) The Xpert Xpress SARS-CoV-2/FLU/RSV plus assay is intended as an aid in the diagnosis of influenza from Nasopharyngeal swab specimens and should not be used as a sole basis for  treatment. Nasal washings and aspirates are unacceptable for Xpert Xpress  SARS-CoV-2/FLU/RSV testing.  Fact Sheet for Patients: EntrepreneurPulse.com.au  Fact Sheet for Healthcare Providers: IncredibleEmployment.be  This test is not yet approved or cleared by the [place] FDA and has been authorized for detection and/or diagnosis of SARS-CoV-2 by FDA under an Emergency Use Authorization (EUA). This EUA will remain in effect (meaning this test can be used) for the duration of the COVID-19 declaration under Section 564(b)(1) of the Act, 21 U.S.C. section 360bbb-3(b)(1), unless the authorization is terminated or revoked.     Resp Syncytial Virus by PCR NEGATIVE NEGATIVE Final    Comment: (NOTE) Fact Sheet for Patients: EntrepreneurPulse.com.au  Fact Sheet for Healthcare Providers: IncredibleEmployment.be  This test is not yet approved or cleared by the [place] FDA and has been authorized for detection and/or diagnosis of SARS-CoV-2 by FDA under an Emergency Use Authorization (EUA). This EUA will remain in effect (meaning this test can be used) for the duration of the COVID-19 declaration under Section 564(b)(1) of the Act, 21 U.S.C. section 360bbb-3(b)(1), unless the authorization is terminated or revoked.  Performed at [facility], [address] [place], [place] [address]   Culture, blood (Routine X 2) w Reflex to ID Panel     Status: None (Preliminary result)   Collection Time: 02/06/23 12:51 PM   Specimen: BLOOD  Result Value Ref Range Status   Specimen Description BLOOD BLOOD LEFT ARM LAC  Final   Special Requests   Final    BOTTLES DRAWN AEROBIC AND ANAEROBIC Blood Culture adequate volume   Culture   Final    NO GROWTH 3 DAYS Performed at [facility], [address] [place], [place] [address]    Report Status PENDING  Incomplete  Culture, blood (Routine X 2) w Reflex to ID Panel     Status: None (Preliminary result)   Collection  Time: 02/06/23  1:00 PM   Specimen: BLOOD  Result Value Ref Range Status   Specimen Description BLOOD BLOOD RIGHT ARM RAC  Final   Special Requests   Final    BOTTLES DRAWN AEROBIC AND ANAEROBIC Blood Culture adequate volume   Culture   Final    NO GROWTH 3 DAYS Performed at [facility], [address], [place], [place] [address]    Report Status PENDING  Incomplete     Total time spend on discharging this patient, including the last patient exam, discussing the hospital stay, instructions for ongoing care as it relates to all pertinent caregivers, as well as preparing the medical discharge records, prescriptions, and/or referrals as applicable, is 35 minutes.    Enzo Bi, MD  [facility] 02/09/2023, 9:55 AM

## 2023-02-09 NOTE — Addendum Note (Signed)
Addended by: Vanice Sarah on: 02/09/2023 03:34 PM   Modules accepted: Orders

## 2023-02-09 NOTE — TOC Transition Note (Signed)
Transition of Care [facility]) - CM/SW Discharge Note   Patient Details  Name: Kenneth Franklin MRN: [id number] Date of Birth: [birth year]  [facility]) CM/SW Contact:  Gerilyn Pilgrim, LCSW Phone Number: 02/09/2023, 9:42 AM   Clinical Narrative:   Josem Kaufmann approved for [facility]. CSW spoke with Tiffany who confirms they are able to receive pt today. Medical necessity printed to unit. RN given number for report. CSW to send DC summary once available.          Patient Goals and CMS Choice      Discharge Placement                         Discharge Plan and Services Additional resources added to the After Visit Summary for                                       Social Determinants of Health (SDOH) Interventions SDOH Screenings   Tobacco Use: High Risk (02/03/2023)     Readmission Risk Interventions    10/27/2020    1:28 PM  Readmission Risk Prevention Plan  Transportation Screening Complete  PCP or Specialist Appt within 3-5 Days Complete  HRI or [place] Complete  Social Work Consult for [place] Planning/Counseling Complete  Palliative Care Screening Not Applicable  Medication Review [employer]) Complete

## 2023-02-09 NOTE — Telephone Encounter (Signed)
Lab orders entered

## 2023-02-09 NOTE — Telephone Encounter (Signed)
I spoke with Tina-patient currently in rehab.  Will reassess; then order MRI.  Please have the patient follow-up with me in 3- 4 weeks- MD; labs- cbc/cmp.  Please call the daughter Tina-to schedule those appointments.  Thanks GB

## 2023-02-09 NOTE — TOC Progression Note (Signed)
Transition of Care [facility]) - Progression Note    Patient Details  Name: Kenneth Franklin MRN: [id number] Date of Birth: 04/24/1946  Transition of Care [facility]) CM/SW Contact  Gerilyn Pilgrim, LCSW Phone Number: 02/09/2023, 9:26 AM  Clinical Narrative:   Auth approved for [facility] for 3/14-3/18.          Expected Discharge Plan and Services                                               Social Determinants of Health (SDOH) Interventions SDOH Screenings   Tobacco Use: High Risk (02/03/2023)    Readmission Risk Interventions    10/27/2020    1:28 PM  Readmission Risk Prevention Plan  Transportation Screening Complete  PCP or Specialist Appt within 3-5 Days Complete  HRI or [place] Complete  Social Work Consult for [place] Planning/Counseling Complete  Palliative Care Screening Not Applicable  Medication Review Press photographer) Complete

## 2023-02-11 LAB — CULTURE, BLOOD (ROUTINE X 2)
Culture: NO GROWTH
Culture: NO GROWTH
Special Requests: ADEQUATE
Special Requests: ADEQUATE

## 2023-02-28 ENCOUNTER — Other Ambulatory Visit: Payer: Self-pay

## 2023-03-08 ENCOUNTER — Encounter: Payer: Self-pay | Admitting: Internal Medicine

## 2023-03-08 ENCOUNTER — Inpatient Hospital Stay: Payer: Medicare Other | Attending: Internal Medicine

## 2023-03-08 ENCOUNTER — Inpatient Hospital Stay (HOSPITAL_BASED_OUTPATIENT_CLINIC_OR_DEPARTMENT_OTHER): Payer: Medicare Other | Admitting: Internal Medicine

## 2023-03-08 VITALS — BP 138/80 | HR 85 | Temp 96.8°F | Resp 16 | Wt 123.0 lb

## 2023-03-08 DIAGNOSIS — Z923 Personal history of irradiation: Secondary | ICD-10-CM | POA: Diagnosis not present

## 2023-03-08 DIAGNOSIS — C119 Malignant neoplasm of nasopharynx, unspecified: Secondary | ICD-10-CM | POA: Insufficient documentation

## 2023-03-08 DIAGNOSIS — Z9221 Personal history of antineoplastic chemotherapy: Secondary | ICD-10-CM | POA: Insufficient documentation

## 2023-03-08 LAB — CMP (CANCER CENTER ONLY)
ALT: 22 U/L (ref 0–44)
AST: 25 U/L (ref 15–41)
Albumin: 3.8 g/dL (ref 3.5–5.0)
Alkaline Phosphatase: 156 U/L — ABNORMAL HIGH (ref 38–126)
Anion gap: 10 (ref 5–15)
BUN: 10 mg/dL (ref 8–23)
CO2: 29 mmol/L (ref 22–32)
Calcium: 9.5 mg/dL (ref 8.9–10.3)
Chloride: 96 mmol/L — ABNORMAL LOW (ref 98–111)
Creatinine: 0.86 mg/dL (ref 0.61–1.24)
GFR, Estimated: >60 mL/min (ref >60)
Glucose, Bld: 121 mg/dL — ABNORMAL HIGH (ref 70–99)
Potassium: 4.7 mmol/L (ref 3.5–5.1)
Sodium: 135 mmol/L (ref 135–145)
Total Bilirubin: 0.3 mg/dL (ref 0.3–1.2)
Total Protein: 8 g/dL (ref 6.5–8.1)

## 2023-03-08 LAB — CBC WITH DIFFERENTIAL (CANCER CENTER ONLY)
Abs Immature Granulocytes: 0.07 10*3/uL (ref 0.00–0.07)
Basophils Absolute: 0.1 10*3/uL (ref 0.0–0.1)
Basophils Relative: 1 %
Eosinophils Absolute: 0.1 10*3/uL (ref 0.0–0.5)
Eosinophils Relative: 2 %
HCT: 43.9 % (ref 39.0–52.0)
Hemoglobin: 13.4 g/dL (ref 13.0–17.0)
Immature Granulocytes: 1 %
Lymphocytes Relative: 19 %
Lymphs Abs: 1.2 10*3/uL (ref 0.7–4.0)
MCH: 26.6 pg (ref 26.0–34.0)
MCHC: 30.5 g/dL (ref 30.0–36.0)
MCV: 87.1 fL (ref 80.0–100.0)
Monocytes Absolute: 0.4 10*3/uL (ref 0.1–1.0)
Monocytes Relative: 6 %
Neutro Abs: 4.4 10*3/uL (ref 1.7–7.7)
Neutrophils Relative %: 71 %
Platelet Count: 401 10*3/uL — ABNORMAL HIGH (ref 150–400)
RBC: 5.04 MIL/uL (ref 4.22–5.81)
RDW: 14.4 % (ref 11.5–15.5)
WBC Count: 6.2 10*3/uL (ref 4.0–10.5)
nRBC: 0 % (ref 0.0–0.2)

## 2023-03-08 NOTE — Progress Notes (Signed)
Patient here for oncology follow-up appointment, concerns of constant nausea and right ear discomfort/ clogged

## 2023-03-08 NOTE — Progress Notes (Signed)
[facility] OFFICE PROGRESS NOTE  Patient Care Team: Etheleen Nicks, NP as PCP - General Vernie Murders, MD (Otolaryngology) Katrinka Blazing Jethro Poling, NP (Inactive) as Nurse Practitioner (Hospice and Palliative Medicine) Rosetta Posner, MD as Referring Physician (Geriatric Medicine) Alinda Dooms, NP as Nurse Practitioner (Nurse Practitioner) Dorcas Carrow, RN as Registered Nurse Earna Coder, MD as Consulting Physician (Oncology)  SUMMARY OF ONCOLOGIC HISTORY: Oncology History  Nasopharyngeal cancer  08/18/2020 Initial Diagnosis   Nasopharyngeal carcinoma (HCC)   08/18/2020 Cancer Staging   Staging form: Pharynx - Nasopharynx, AJCC 8th Edition - Clinical stage from 08/18/2020: Stage III (cT3, cN0, cM0) - Signed by Rosey Bath, MD on 02/17/2021 Histopathologic type: Squamous cell carcinoma, keratinizing, NOS Stage prefix: Initial diagnosis ECOG performance status: Grade 1 Stage used in treatment planning: Yes National guidelines used in treatment planning: Yes Type of national guideline used in treatment planning: NCCN   08/24/2020 - 10/20/2020 Chemotherapy         12/31/2020 -  Chemotherapy    Patient is on Treatment Plan: HEAD/NECK NASOPHARYNGEAL ADJUVANT CISPLATIN D1 + 5FU IVCI D1-5 Q28D       INTERVAL HISTORY: in wheel chair; with step daughter.   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  In the interim patient was admitted to hospital for mental status changes/delirium workup showed-March-MRI brain showed "Scattered acute infarcts in the right ACA-MCA border zone.  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   Patient has constant nausea and right ear discomfort/ clogged.   He is currently at home.   He lives alone.  He has private sitters at home.  Positive weight loss.  Patient does not want to eat.  Continues to complain of dry mouth.   Review of Systems  Constitutional:  Positive for malaise/fatigue and weight loss. Negative for chills, diaphoresis and fever.  HENT:  Positive for hearing loss. Negative for nosebleeds and sore throat.   Eyes:  Negative for double vision.  Respiratory:  Negative for cough, hemoptysis, sputum production, shortness of breath and wheezing.   Cardiovascular:  Negative for chest pain, palpitations, orthopnea and leg swelling.  Gastrointestinal:  Positive for nausea. Negative for abdominal pain, blood in stool, constipation, diarrhea, heartburn, melena and vomiting.  Genitourinary:  Negative for dysuria, frequency and urgency.  Musculoskeletal:  Positive for back pain and joint pain.  Skin: Negative.  Negative for itching and rash.  Neurological:  Negative for dizziness, tingling, focal weakness, weakness and headaches.  Endo/Heme/Allergies:  Does not bruise/bleed easily.  Psychiatric/Behavioral:  Negative for depression. The patient is not nervous/anxious and does not have insomnia.      ALLERGIES:  is allergic to propoxyphene.  MEDICATIONS:  Current Outpatient Medications  Medication Sig Dispense Refill   acetaminophen (TYLENOL) 325 MG tablet Take 975 mg by mouth every 6 (six) hours as needed.     ADVAIR HFA 115-21 MCG/ACT inhaler Inhale 1 puff into the lungs 2 (two) times daily.     albuterol (PROVENTIL) (2.5 MG/3ML) 0.083% nebulizer solution      Albuterol Sulfate, sensor, 108 (90 Base) MCG/ACT AEPB Inhale 1-2 puffs into the lungs every 6 (six) hours as needed (Wheezing or Shortness of breath).     aspirin EC 81 MG tablet Take 81 mg by mouth daily. Swallow whole.  atorvastatin (LIPITOR) 80 MG tablet Take 1 tablet (80 mg total) by mouth daily.     Cholecalciferol (QC VITAMIN D3) 50 MCG (2000 UT) CAPS Take 2,000 Units by mouth daily.      clopidogrel (PLAVIX) 75 MG tablet Take 75 mg by mouth every morning.     DULoxetine (CYMBALTA) 30 MG capsule Take 30 mg by mouth daily.     famotidine (PEPCID) 40 MG tablet Take 40 mg by mouth daily.     feeding supplement (ENSURE ENLIVE / ENSURE PLUS) LIQD Take 237 mLs by mouth 3 (three) times daily between meals. 237 mL 12   folic acid (FOLVITE) 1 MG tablet Take 1 mg by mouth daily.     glipiZIDE (GLUCOTROL) 5 MG tablet Take 1 tablet by mouth daily.     linaclotide (LINZESS) 72 MCG capsule Hold while in nursing facility.     Multiple Vitamin (MULTIVITAMIN WITH MINERALS) TABS tablet Take 1 tablet by mouth daily.     nicotine (NICODERM CQ - DOSED IN MG/24 HOURS) 14 mg/24hr patch Place 1 patch (14 mg total) onto the skin daily. 28 patch 0   ondansetron (ZOFRAN-ODT) 4 MG disintegrating tablet Take 8 mg by mouth every 8 (eight) hours as needed.     oxyCODONE (OXY IR/ROXICODONE) 5 MG immediate release tablet Take 1 tablet (5 mg total) by mouth 2 (two) times daily as needed. Home med. 20 tablet 0   pantoprazole (PROTONIX) 40 MG tablet Take 40 mg by mouth daily.     pilocarpine (SALAGEN) 5 MG tablet Take 5 mg by mouth 3 (three) times daily.     polyethylene glycol powder (GLYCOLAX/MIRALAX) 17 GM/SCOOP powder Take 17 g by mouth in the morning and at bedtime.  0   polyvinyl alcohol (LIQUIFILM TEARS) 1.4 % ophthalmic solution Apply to eye.     sodium chloride (OCEAN) 0.65 % nasal spray Place into the nose.     No current facility-administered medications for this visit.   Facility-Administered Medications Ordered in Other Visits  Medication Dose Route Frequency Provider Last Rate Last Admin   sodium chloride flush (NS) 0.9 % injection 10 mL  10 mL Intravenous PRN Nelva Nay C, MD   10 mL at 12/03/20 1138    PHYSICAL EXAMINATION:   Vitals:   03/08/23 1511  BP: 138/80  Pulse: 85  Resp: 16  Temp: (!) 96.8 F (36 C)  SpO2: 100%    Filed Weights   03/08/23 1511  Weight: 123 lb (55.8  kg)     Physical Exam Vitals and nursing note reviewed.  HENT:     Head: Normocephalic and atraumatic.     Mouth/Throat:     Pharynx: Oropharynx is clear.  Eyes:     Extraocular Movements: Extraocular movements intact.     Pupils: Pupils are equal, round, and reactive to light.  Cardiovascular:     Rate and Rhythm: Normal rate and regular rhythm.  Pulmonary:     Comments: Decreased breath sounds bilaterally.  Abdominal:     Palpations: Abdomen is soft.  Musculoskeletal:        General: Normal range of motion.     Cervical back: Normal range of motion.  Skin:    General: Skin is warm.  Neurological:     General: No focal deficit present.     Mental Status: He is alert and oriented to person, place, and time.  Psychiatric:        Behavior: Behavior normal.  Judgment: Judgment normal.      LABORATORY DATA:  I have reviewed the data as listed    Component Value Date/Time   NA 135 03/08/2023 1450   NA 137 12/15/2015 1056   K 4.7 03/08/2023 1450   CL 96 (L) 03/08/2023 1450   CO2 29 03/08/2023 1450   GLUCOSE 121 (H) 03/08/2023 1450   BUN 10 03/08/2023 1450   BUN 8 12/15/2015 1056   CREATININE 0.86 03/08/2023 1450   CREATININE 1.07 06/16/2016 1632   CALCIUM 9.5 03/08/2023 1450   PROT 8.0 03/08/2023 1450   PROT 6.5 12/15/2015 1056   ALBUMIN 3.8 03/08/2023 1450   ALBUMIN 4.2 12/15/2015 1056   AST 25 03/08/2023 1450   ALT 22 03/08/2023 1450   ALKPHOS 156 (H) 03/08/2023 1450   BILITOT 0.3 03/08/2023 1450   GFRNONAA >60 03/08/2023 1450   GFRNONAA 71 06/16/2016 1632   GFRAA >60 08/18/2020 1129   GFRAA 82 06/16/2016 1632    No results found for: "SPEP", "UPEP"  Lab Results  Component Value Date   WBC 6.2 03/08/2023   NEUTROABS 4.4 03/08/2023   HGB 13.4 03/08/2023   HCT 43.9 03/08/2023   MCV 87.1 03/08/2023   PLT 401 (H) 03/08/2023      Chemistry      Component Value Date/Time   NA 135 03/08/2023 1450   NA 137 12/15/2015 1056   K 4.7 03/08/2023  1450   CL 96 (L) 03/08/2023 1450   CO2 29 03/08/2023 1450   BUN 10 03/08/2023 1450   BUN 8 12/15/2015 1056   CREATININE 0.86 03/08/2023 1450   CREATININE 1.07 06/16/2016 1632      Component Value Date/Time   CALCIUM 9.5 03/08/2023 1450   ALKPHOS 156 (H) 03/08/2023 1450   AST 25 03/08/2023 1450   ALT 22 03/08/2023 1450   BILITOT 0.3 03/08/2023 1450       No results found for: "CA125", "CEA", "PSA", "CA199", "CA2729", "AFP"   RADIOGRAPHIC STUDIES: I have personally reviewed the radiological images as listed and agreed with the findings in the report. No results found.   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 S/p 5 weeks of concurrent cisplatin and radiation [shortened x 3 fractions d/t mucositis].  Patient declined adjuvant chemotherapy.  Completed 10/24/20. 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 JUNE 13th, 2023-PET scan marked reduced in size of soft tissue mass centered in the sphenoid sinus as well as marked reduction in metabolic activity. Small focus of moderate metabolic activity does remain along the posterior RIGHT aspect of the sphenoid sinus.[Dr.Juengel].    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  # Recommend PET scan for further evaluation.  Also recommend evaluation with ENT.  Discussed with Dr.Juengle.   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  S/p evaluation with Per Neuro-as per family at baseline.  [facility] versus ENT causes.?  Await ENT evaluation.    # DISPOSITION:  # PET scan ASAP # follow up in -1-2 days after PET-MD;  no labs- dr.B     Orders Placed This Encounter  Procedures   NM PET  Image Restage (PS) Skull Base to Thigh (F-18 FDG)    Standing Status:   Future  Standing Expiration Date:   03/07/2024    Order Specific Question:   If indicated for the ordered procedure, I authorize the administration of a radiopharmaceutical per Radiology protocol    Answer:   Yes    Order Specific Question:   Preferred imaging location?    Answer:   [place] [facility]   All questions were answered. The patient knows to call the clinic with any problems, questions or concerns. No barriers to learning was detected.    Earna Coder, MD 03/08/2023 9:13 PM

## 2023-03-08 NOTE — Assessment & Plan Note (Addendum)
#   T3NxMx/stage 3 nasopharyngeal carcinoma- unresectable. S/p 5 weeks of concurrent cisplatin and radiation [shortened x 3 fractions d/t mucositis].  Patient declined adjuvant chemotherapy.  Completed 10/24/20. 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 JUNE 13th, 2023-PET scan marked reduced in size of soft tissue mass centered in the sphenoid sinus as well as marked reduction in metabolic activity. Small focus of moderate metabolic activity does remain along the posterior RIGHT aspect of the sphenoid sinus.[Dr.Juengel].    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  # Recommend PET scan for further evaluation.  Also recommend evaluation with ENT.  Discussed with Dr.Juengle.   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  S/p evaluation with Per Neuro-as per family at baseline.  [facility] causes.?  Await ENT evaluation.    # DISPOSITION:  # PET scan ASAP # follow up in -1-2 days after PET-MD;  no labs- dr.B

## 2023-03-10 ENCOUNTER — Other Ambulatory Visit: Payer: Self-pay

## 2023-03-16 ENCOUNTER — Ambulatory Visit
Admission: RE | Admit: 2023-03-16 | Discharge: 2023-03-16 | Disposition: A | Discharge status: Home / Self Care | Payer: Medicare Other | Source: Ambulatory Visit | Attending: Internal Medicine | Admitting: Internal Medicine

## 2023-03-16 DIAGNOSIS — C119 Malignant neoplasm of nasopharynx, unspecified: Secondary | ICD-10-CM

## 2023-03-17 ENCOUNTER — Ambulatory Visit
Admission: RE | Admit: 2023-03-17 | Discharge: 2023-03-17 | Disposition: A | Discharge status: Home / Self Care | Payer: Medicare Other | Source: Ambulatory Visit | Attending: Internal Medicine | Admitting: Internal Medicine

## 2023-03-17 DIAGNOSIS — Z9221 Personal history of antineoplastic chemotherapy: Secondary | ICD-10-CM | POA: Diagnosis not present

## 2023-03-17 DIAGNOSIS — I6521 Occlusion and stenosis of right carotid artery: Secondary | ICD-10-CM | POA: Diagnosis not present

## 2023-03-17 DIAGNOSIS — Z923 Personal history of irradiation: Secondary | ICD-10-CM | POA: Diagnosis not present

## 2023-03-17 DIAGNOSIS — R9389 Abnormal findings on diagnostic imaging of other specified body structures: Secondary | ICD-10-CM | POA: Insufficient documentation

## 2023-03-17 DIAGNOSIS — C119 Malignant neoplasm of nasopharynx, unspecified: Secondary | ICD-10-CM | POA: Insufficient documentation

## 2023-03-17 DIAGNOSIS — I700 Atherosclerosis of aorta: Secondary | ICD-10-CM | POA: Diagnosis not present

## 2023-03-17 LAB — GLUCOSE, CAPILLARY: Glucose-Capillary: 108 mg/dL — ABNORMAL HIGH (ref 70–99)

## 2023-03-17 MED ORDER — FLUDEOXYGLUCOSE F - 18 (FDG) INJECTION
6.4000 | Freq: Once | INTRAVENOUS | Status: AC
  Administered 2023-03-17: 6.91 via INTRAVENOUS

## 2023-03-21 ENCOUNTER — Inpatient Hospital Stay: Payer: Medicare Other | Admitting: Internal Medicine

## 2023-03-21 ENCOUNTER — Encounter: Payer: Self-pay | Admitting: Internal Medicine

## 2023-03-21 VITALS — BP 127/84 | HR 103 | Temp 96.6°F | Ht 72.0 in | Wt 124.3 lb

## 2023-03-21 DIAGNOSIS — C119 Malignant neoplasm of nasopharynx, unspecified: Secondary | ICD-10-CM | POA: Diagnosis not present

## 2023-03-21 NOTE — Progress Notes (Signed)
[facility] OFFICE PROGRESS NOTE  Patient Care Team: Etheleen Nicks, NP as PCP - General Vernie Murders, MD (Otolaryngology) Katrinka Blazing Jethro Poling, NP (Inactive) as Nurse Practitioner (Hospice and Palliative Medicine) Rosetta Posner, MD as Referring Physician (Geriatric Medicine) Alinda Dooms, NP as Nurse Practitioner (Nurse Practitioner) Dorcas Carrow, RN as Registered Nurse Earna Coder, MD as Consulting Physician (Oncology)  SUMMARY OF ONCOLOGIC HISTORY: Oncology History  Nasopharyngeal cancer  08/18/2020 Initial Diagnosis   Nasopharyngeal carcinoma (HCC)   08/18/2020 Cancer Staging   Staging form: Pharynx - Nasopharynx, AJCC 8th Edition - Clinical stage from 08/18/2020: Stage III (cT3, cN0, cM0) - Signed by Rosey Bath, MD on 02/17/2021 Histopathologic type: Squamous cell carcinoma, keratinizing, NOS Stage prefix: Initial diagnosis ECOG performance status: Grade 1 Stage used in treatment planning: Yes National guidelines used in treatment planning: Yes Type of national guideline used in treatment planning: NCCN   08/24/2020 - 10/20/2020 Chemotherapy         12/31/2020 -  Chemotherapy    Patient is on Treatment Plan: HEAD/NECK NASOPHARYNGEAL ADJUVANT CISPLATIN D1 + 5FU IVCI D1-5 Q28D       INTERVAL HISTORY: in wheel chair; with step daughter.   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  However patient is improving with physical therapy.  However continues to have weakness on the left side compared to the right but overall improved.  Not back at baseline.  He is currently at home.  He lives alone.  He has private sitters at home.  Positive weight loss.  Patient does not want to eat.   Continues to complain of dry mouth.   Review of Systems  Constitutional:  Positive for malaise/fatigue and weight loss. Negative for chills, diaphoresis and fever.  HENT:  Positive for hearing loss. Negative for nosebleeds and sore throat.   Eyes:  Negative for double vision.  Respiratory:  Negative for cough, hemoptysis, sputum production, shortness of breath and wheezing.   Cardiovascular:  Negative for chest pain, palpitations, orthopnea and leg swelling.  Gastrointestinal:  Positive for nausea. Negative for abdominal pain, blood in stool, constipation, diarrhea, heartburn, melena and vomiting.  Genitourinary:  Negative for dysuria, frequency and urgency.  Musculoskeletal:  Positive for back pain and joint pain.  Skin: Negative.  Negative for itching and rash.  Neurological:  Negative for dizziness, tingling, focal weakness, weakness and headaches.  Endo/Heme/Allergies:  Does not bruise/bleed easily.  Psychiatric/Behavioral:  Negative for depression. The patient is not nervous/anxious and does not have insomnia.      ALLERGIES:  is allergic to propoxyphene.  MEDICATIONS:  Current Outpatient Medications  Medication Sig Dispense Refill   acetaminophen (TYLENOL) 325 MG tablet Take 975 mg by mouth every 6 (six) hours as needed.     ADVAIR HFA 115-21 MCG/ACT inhaler Inhale 1 puff into the lungs 2 (two) times daily.     albuterol (PROVENTIL) (2.5 MG/3ML) 0.083% nebulizer solution      Albuterol Sulfate, sensor, 108 (90 Base) MCG/ACT AEPB Inhale 1-2 puffs into the lungs every 6 (six) hours as needed (Wheezing or Shortness of breath).     aspirin EC 81 MG tablet Take 81 mg by mouth daily. Swallow whole.     atorvastatin (LIPITOR) 80 MG tablet Take 1 tablet (80 mg total) by mouth daily.  Cholecalciferol (QC VITAMIN D3) 50 MCG (2000 UT) CAPS Take 2,000 Units by mouth daily.     clopidogrel (PLAVIX) 75 MG tablet Take 75 mg by mouth every morning.     DULoxetine (CYMBALTA) 30 MG capsule Take  30 mg by mouth daily.     famotidine (PEPCID) 40 MG tablet Take 40 mg by mouth daily.     feeding supplement (ENSURE ENLIVE / ENSURE PLUS) LIQD Take 237 mLs by mouth 3 (three) times daily between meals. 237 mL 12   folic acid (FOLVITE) 1 MG tablet Take 1 mg by mouth daily.     glipiZIDE (GLUCOTROL) 5 MG tablet Take 1 tablet by mouth daily.     linaclotide (LINZESS) 72 MCG capsule Hold while in nursing facility.     Multiple Vitamin (MULTIVITAMIN WITH MINERALS) TABS tablet Take 1 tablet by mouth daily.     nicotine (NICODERM CQ - DOSED IN MG/24 HOURS) 14 mg/24hr patch Place 1 patch (14 mg total) onto the skin daily. 28 patch 0   ondansetron (ZOFRAN-ODT) 4 MG disintegrating tablet Take 8 mg by mouth every 8 (eight) hours as needed.     oxyCODONE (OXY IR/ROXICODONE) 5 MG immediate release tablet Take 1 tablet (5 mg total) by mouth 2 (two) times daily as needed. Home med. 20 tablet 0   pantoprazole (PROTONIX) 40 MG tablet Take 40 mg by mouth daily.     pilocarpine (SALAGEN) 5 MG tablet Take 5 mg by mouth 3 (three) times daily.     polyethylene glycol powder (GLYCOLAX/MIRALAX) 17 GM/SCOOP powder Take 17 g by mouth in the morning and at bedtime.  0   polyvinyl alcohol (LIQUIFILM TEARS) 1.4 % ophthalmic solution Apply to eye.     sodium chloride (OCEAN) 0.65 % nasal spray Place into the nose.     No current facility-administered medications for this visit.   Facility-Administered Medications Ordered in Other Visits  Medication Dose Route Frequency Provider Last Rate Last Admin   sodium chloride flush (NS) 0.9 % injection 10 mL  10 mL Intravenous PRN Nelva Nay C, MD   10 mL at 12/03/20 1138    PHYSICAL EXAMINATION:   Vitals:   03/21/23 1059  BP: 127/84  Pulse: (!) 103  Temp: (!) 96.6 F (35.9 C)  SpO2: 100%    Filed Weights   03/21/23 1059  Weight: 124 lb 4.8 oz (56.4 kg)     Physical Exam Vitals and nursing note reviewed.  HENT:     Head: Normocephalic and atraumatic.      Mouth/Throat:     Pharynx: Oropharynx is clear.  Eyes:     Extraocular Movements: Extraocular movements intact.     Pupils: Pupils are equal, round, and reactive to light.  Cardiovascular:     Rate and Rhythm: Normal rate and regular rhythm.  Pulmonary:     Comments: Decreased breath sounds bilaterally.  Abdominal:     Palpations: Abdomen is soft.  Musculoskeletal:        General: Normal range of motion.     Cervical back: Normal range of motion.  Skin:    General: Skin is warm.  Neurological:     General: No focal deficit present.     Mental Status: He is alert and oriented to person, place, and time.  Psychiatric:        Behavior: Behavior normal.        Judgment: Judgment normal.      LABORATORY DATA:  I have reviewed the data  as listed    Component Value Date/Time   NA 135 03/08/2023 1450   NA 137 12/15/2015 1056   K 4.7 03/08/2023 1450   CL 96 (L) 03/08/2023 1450   CO2 29 03/08/2023 1450   GLUCOSE 121 (H) 03/08/2023 1450   BUN 10 03/08/2023 1450   BUN 8 12/15/2015 1056   CREATININE 0.86 03/08/2023 1450   CREATININE 1.07 06/16/2016 1632   CALCIUM 9.5 03/08/2023 1450   PROT 8.0 03/08/2023 1450   PROT 6.5 12/15/2015 1056   ALBUMIN 3.8 03/08/2023 1450   ALBUMIN 4.2 12/15/2015 1056   AST 25 03/08/2023 1450   ALT 22 03/08/2023 1450   ALKPHOS 156 (H) 03/08/2023 1450   BILITOT 0.3 03/08/2023 1450   GFRNONAA >60 03/08/2023 1450   GFRNONAA 71 06/16/2016 1632   GFRAA >60 08/18/2020 1129   GFRAA 82 06/16/2016 1632    No results found for: "SPEP", "UPEP"  Lab Results  Component Value Date   WBC 6.2 03/08/2023   NEUTROABS 4.4 03/08/2023   HGB 13.4 03/08/2023   HCT 43.9 03/08/2023   MCV 87.1 03/08/2023   PLT 401 (H) 03/08/2023      Chemistry      Component Value Date/Time   NA 135 03/08/2023 1450   NA 137 12/15/2015 1056   K 4.7 03/08/2023 1450   CL 96 (L) 03/08/2023 1450   CO2 29 03/08/2023 1450   BUN 10 03/08/2023 1450   BUN 8 12/15/2015 1056    CREATININE 0.86 03/08/2023 1450   CREATININE 1.07 06/16/2016 1632      Component Value Date/Time   CALCIUM 9.5 03/08/2023 1450   ALKPHOS 156 (H) 03/08/2023 1450   AST 25 03/08/2023 1450   ALT 22 03/08/2023 1450   BILITOT 0.3 03/08/2023 1450       No results found for: "CA125", "CEA", "PSA", "CA199", "CA2729", "AFP"   RADIOGRAPHIC STUDIES: I have personally reviewed the radiological images as listed and agreed with the findings in the report. No results found.   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 S/p 5 weeks of concurrent cisplatin and radiation [shortened x 3 fractions d/t mucositis].  Patient declined adjuvant chemotherapy.  Completed 10/24/20. [Dr.Juengel].  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 No findings suspicious for metastatic disease. See below.   # Discussed options include-reirradiation; will refer to Dr. Aggie Cosier.  Also discussed option of immunotherapy.  I discussed the mechanism of action; The goal of therapy is palliative; and length of treatments are likely ongoing/based upon the results of the scans. Discussed the potential side effects of immunotherapy including but not limited to diarrhea; skin rash; elevated LFTs/endocrine abnormalities etc.  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  S/p evaluation with [facility]-improving.  See below  [facility] versus E[facility] causes.?  Await ENT evaluation next week.   PROGNOSIS: I do long discussion with the  patient and his daughter regarding the difficult situation of recurrent malignancy.  Understand treatments are palliative and not curative.  Also understands the potential side effects of the treatment might outweigh the  benefits.  Again reviewed immunotherapy and radiation as options-but that he will have to be balanced out in the context of patient's recent stroke significant decline in his performance status/risk of side effects etc.  Also discussed hospice as an option if patient decides or chooses to hold off any further therapy.  However patient seems to be leaning towards treatment options.  Await discussion of the tumor conference also. Discussed with Dr.Juengle; and Dr. Aggie Cosier.  # DISPOSITION:  # refer to Dr.Chrystal re: nasophryngeal cancer- recurrence- # follow up in 4 weeks- MD: labs- cbc/cmp; TSH- - Dr.B  # 40 minutes face-to-face with the patient discussing the above plan of care; more than 50% of time spent on prognosis/ natural history; counseling and coordination.   No orders of the defined types were placed in this encounter.  All questions were answered. The patient knows to call the clinic with any problems, questions or concerns. No barriers to learning was detected.    Earna Coder, MD 03/21/2023 12:33 PM

## 2023-03-21 NOTE — Progress Notes (Signed)
No concerns today 

## 2023-03-21 NOTE — Assessment & Plan Note (Addendum)
#   T3NxMx/stage 3 nasopharyngeal carcinoma- unresectable. S/p 5 weeks of concurrent cisplatin and radiation [shortened x 3 fractions d/t mucositis].  Patient declined adjuvant chemotherapy.  Completed 10/24/20. [Dr.Juengel].  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 No findings suspicious for metastatic disease. See below.   # Discussed options include-reirradiation; will refer to Dr. Aggie Cosier.  Also discussed option of immunotherapy.  I discussed the mechanism of action; The goal of therapy is palliative; and length of treatments are likely ongoing/based upon the results of the scans. Discussed the potential side effects of immunotherapy including but not limited to diarrhea; skin rash; elevated LFTs/endocrine abnormalities etc.  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  S/p evaluation with [facility] Neuro-improving.  See below  # [facility]central causes versus ENT causes.?  Await ENT evaluation next week.   PROGNOSIS: I do long discussion with the patient and his daughter regarding the difficult situation of recurrent malignancy.  Understand treatments are palliative and not curative.  Also understands the potential side effects of the treatment might outweigh the benefits.  Again reviewed immunotherapy and radiation as options-but that he will have to be balanced out in the context of patient's recent stroke significant decline in his performance status/risk of side effects etc.  Also discussed hospice as an option if patient decides or chooses to hold off any further  therapy.  However patient seems to be leaning towards treatment options.  Await discussion of the tumor conference also. Discussed with Dr.Juengle; and Dr. Aggie Cosier.  # DISPOSITION:  # refer to Dr.Chrystal re: nasophryngeal cancer- recurrence- # follow up in 4 weeks- MD: labs- cbc/cmp; TSH- - Dr.B  # 40 minutes face-to-face with the patient discussing the above plan of care; more than 50% of time spent on prognosis/ natural history; counseling and coordination.

## 2023-03-22 ENCOUNTER — Other Ambulatory Visit: Payer: Self-pay

## 2023-03-23 ENCOUNTER — Other Ambulatory Visit: Payer: Medicare Other

## 2023-03-23 ENCOUNTER — Telehealth: Payer: Self-pay | Admitting: Internal Medicine

## 2023-03-23 NOTE — Telephone Encounter (Signed)
Discussed at tumor conference-options radiation versus systemic therapy-immunotherapy.  Given the local disease recommend radiation.  Spoke to the daughter-concerned about the potential radiation side effects/in the context of patient's recent stroke.  Defer to Dr. Aggie Cosier for further management.  GB

## 2023-03-28 ENCOUNTER — Encounter: Payer: Self-pay | Admitting: Radiation Oncology

## 2023-03-28 ENCOUNTER — Ambulatory Visit
Admission: RE | Admit: 2023-03-28 | Discharge: 2023-03-28 | Disposition: A | Discharge status: Home / Self Care | Payer: Medicare Other | Source: Ambulatory Visit | Attending: Radiation Oncology | Admitting: Radiation Oncology

## 2023-03-28 VITALS — BP 127/75 | HR 89 | Temp 97.3°F | Resp 12

## 2023-03-28 DIAGNOSIS — Z923 Personal history of irradiation: Secondary | ICD-10-CM | POA: Insufficient documentation

## 2023-03-28 DIAGNOSIS — Z7951 Long term (current) use of inhaled steroids: Secondary | ICD-10-CM | POA: Diagnosis not present

## 2023-03-28 DIAGNOSIS — E119 Type 2 diabetes mellitus without complications: Secondary | ICD-10-CM | POA: Diagnosis not present

## 2023-03-28 DIAGNOSIS — Z79899 Other long term (current) drug therapy: Secondary | ICD-10-CM | POA: Diagnosis not present

## 2023-03-28 DIAGNOSIS — Z7982 Long term (current) use of aspirin: Secondary | ICD-10-CM | POA: Diagnosis not present

## 2023-03-28 DIAGNOSIS — Z8673 Personal history of transient ischemic attack (TIA), and cerebral infarction without residual deficits: Secondary | ICD-10-CM | POA: Insufficient documentation

## 2023-03-28 DIAGNOSIS — I10 Essential (primary) hypertension: Secondary | ICD-10-CM | POA: Diagnosis not present

## 2023-03-28 DIAGNOSIS — Z7902 Long term (current) use of antithrombotics/antiplatelets: Secondary | ICD-10-CM | POA: Insufficient documentation

## 2023-03-28 DIAGNOSIS — C119 Malignant neoplasm of nasopharynx, unspecified: Secondary | ICD-10-CM

## 2023-03-28 DIAGNOSIS — Z85828 Personal history of other malignant neoplasm of skin: Secondary | ICD-10-CM | POA: Diagnosis not present

## 2023-03-28 DIAGNOSIS — F17290 Nicotine dependence, other tobacco product, uncomplicated: Secondary | ICD-10-CM | POA: Diagnosis not present

## 2023-03-28 DIAGNOSIS — E785 Hyperlipidemia, unspecified: Secondary | ICD-10-CM | POA: Diagnosis not present

## 2023-03-28 DIAGNOSIS — Z7984 Long term (current) use of oral hypoglycemic drugs: Secondary | ICD-10-CM | POA: Insufficient documentation

## 2023-03-28 NOTE — Consult Note (Signed)
NEW PATIENT EVALUATION  Name: Kenneth Franklin  MRN: [id number]  Date:   03/28/2023     DOB: Sep 29, 1946   This 77 y.o. 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  REFERRING PHYSICIAN: Etheleen Nicks, NP  CHIEF COMPLAINT:  Chief Complaint  Patient presents with   nasopharyngeal cancer    DIAGNOSIS: The encounter diagnosis was Nasopharyngeal cancer (HCC).   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  He recently was admitted to [facility] with acute infarcts in the right ACA-MCA consistent with cerebrovascular accidents causing left-sided weakness.  CT scan angiography showed moderate irregular narrowing of the legs 3 mm segment of the right ICA concerning for recurrent nasopharyngeal cancer with either vasculopathy or direct tumor spread along the right carotid canal.  PET CT scan demonstrated 2.5 cm right sphenoid sinus hypermetabolic activity extending to the clivus compatible with known recurrent nasopharyngeal carcinoma no evidence of suspicious metastatic disease.  He is seeing Dr. Alvino Chapel today for possible biopsy.  He was presented at tumor board.  He is not a candidate for systemic chemotherapy recommendation was for salvage radiation therapy.  He is seen today for opinion he is doing fairly well having no head and neck pain or nasal bleeding or dysphagia.  PLANNED TREATMENT REGIMEN: Salvage radiation therapy  PAST MEDICAL HISTORY:  has a past medical history of Acute ischemic stroke (HCC) (2017), Alcohol abuse, Anxiety, Cancer (HCC),  Depression, Diabetes mellitus without complication (HCC), Dyspnea, Hyperlipidemia, Hypertension, and Skin cancer.    PAST SURGICAL HISTORY:  Past Surgical History:  Procedure Laterality Date   CATARACT EXTRACTION Bilateral    COLONOSCOPY     ESOPHAGOGASTRODUODENOSCOPY (EGD) WITH PROPOFOL N/A 09/26/2022   Procedure: ESOPHAGOGASTRODUODENOSCOPY (EGD) WITH PROPOFOL;  Surgeon: Regis Bill, MD;  Location: [facility] ENDOSCOPY;  Service: Endoscopy;  Laterality: N/A;   NASOPHARYNGOSCOPY N/A 08/12/2020   Procedure: ENDOSCOPIC NASOPHARYNGOSCOPY WITH BIOPSY;  Surgeon: Vernie Murders, MD;  Location: [facility] ORS;  Service: ENT;  Laterality: N/A;   PORTA CATH INSERTION N/A 08/24/2020   Procedure: PORTA CATH INSERTION;  Surgeon: Annice Needy, MD;  Location: [facility] INVASIVE CV LAB;  Service: Cardiovascular;  Laterality: N/A;   PORTA CATH REMOVAL N/A 07/22/2021   Procedure: PORTA CATH REMOVAL;  Surgeon: Annice Needy, MD;  Location: [facility] INVASIVE CV LAB;  Service: Cardiovascular;  Laterality: N/A;    FAMILY HISTORY: family history includes Diabetes in his brother.  SOCIAL HISTORY:  reports that he has been smoking cigars and cigarettes. He has a 40.00 pack-year smoking history. He has never used smokeless tobacco. He reports that he does not currently use alcohol. He reports that he does not use drugs.  ALLERGIES: Propoxyphene  MEDICATIONS:  Current Outpatient Medications  Medication Sig Dispense Refill   acetaminophen (TYLENOL) 325 MG tablet Take 975 mg by mouth every 6 (six) hours as needed.     ADVAIR HFA 115-21 MCG/ACT inhaler Inhale 1 puff into the lungs 2 (two) times daily.     albuterol (PROVENTIL) (2.5 MG/3ML) 0.083% nebulizer solution      Albuterol Sulfate, sensor, 108 (90 Base) MCG/ACT AEPB Inhale  1-2 puffs into the lungs every 6 (six) hours as needed (Wheezing or Shortness of breath).     aspirin EC 81 MG tablet Take 81 mg by mouth daily. Swallow whole.     atorvastatin (LIPITOR) 80 MG tablet Take  1 tablet (80 mg total) by mouth daily.     Cholecalciferol (QC VITAMIN D3) 50 MCG (2000 UT) CAPS Take 2,000 Units by mouth daily.     clopidogrel (PLAVIX) 75 MG tablet Take 75 mg by mouth every morning.     cyproheptadine (PERIACTIN) 4 MG tablet Take 1 tablet by mouth daily.     DULoxetine (CYMBALTA) 30 MG capsule Take 30 mg by mouth daily.     famotidine (PEPCID) 40 MG tablet Take 40 mg by mouth daily.     feeding supplement (ENSURE ENLIVE / ENSURE PLUS) LIQD Take 237 mLs by mouth 3 (three) times daily between meals. 237 mL 12   folic acid (FOLVITE) 1 MG tablet Take 1 mg by mouth daily.     glipiZIDE (GLUCOTROL) 5 MG tablet Take 1 tablet by mouth daily.     linaclotide (LINZESS) 72 MCG capsule Hold while in nursing facility.     Multiple Vitamin (MULTIVITAMIN WITH MINERALS) TABS tablet Take 1 tablet by mouth daily.     nicotine (NICODERM CQ - DOSED IN MG/24 HOURS) 14 mg/24hr patch Place 1 patch (14 mg total) onto the skin daily. 28 patch 0   ondansetron (ZOFRAN-ODT) 4 MG disintegrating tablet Take 8 mg by mouth every 8 (eight) hours as needed.     oxyCODONE (OXY IR/ROXICODONE) 5 MG immediate release tablet Take 1 tablet (5 mg total) by mouth 2 (two) times daily as needed. Home med. 20 tablet 0   pantoprazole (PROTONIX) 40 MG tablet Take 40 mg by mouth daily.     pilocarpine (SALAGEN) 5 MG tablet Take 5 mg by mouth 3 (three) times daily.     polyethylene glycol powder (GLYCOLAX/MIRALAX) 17 GM/SCOOP powder Take 17 g by mouth in the morning and at bedtime.  0   polyvinyl alcohol (LIQUIFILM TEARS) 1.4 % ophthalmic solution Apply to eye.     sodium chloride (OCEAN) 0.65 % nasal spray Place into the nose.     No current facility-administered medications for this encounter.   Facility-Administered Medications Ordered in Other Encounters  Medication Dose Route Frequency Provider Last Rate Last Admin   sodium chloride flush (NS) 0.9 % injection 10 mL  10 mL Intravenous PRN Nelva Nay C, MD    10 mL at 12/03/20 1138    ECOG PERFORMANCE STATUS:  0 - Asymptomatic  REVIEW OF SYSTEMS: Patient denies any weight loss, fatigue, weakness, fever, chills or night sweats. Patient denies any loss of vision, blurred vision. Patient denies any ringing  of the ears or hearing loss. No irregular heartbeat. Patient denies heart murmur or history of fainting. Patient denies any chest pain or pain radiating to her upper extremities. Patient denies any shortness of breath, difficulty breathing at night, cough or hemoptysis. Patient denies any swelling in the lower legs. Patient denies any nausea vomiting, vomiting of blood, or coffee ground material in the vomitus. Patient denies any stomach pain. Patient states has had normal bowel movements no significant constipation or diarrhea. Patient denies any dysuria, hematuria or significant nocturia. Patient denies any problems walking, swelling in the joints or loss of balance. Patient denies any skin changes, loss of hair or loss of weight. Patient denies any excessive worrying or anxiety or significant depression.  Patient denies any problems with insomnia. Patient denies excessive thirst, polyuria, polydipsia. Patient denies any swollen glands, patient denies easy bruising or easy bleeding. Patient denies any recent infections, allergies or URI. Patient "s visual fields have not changed significantly in recent time.   PHYSICAL EXAM: BP 127/75 (BP Location: Right Arm, Patient Position: Sitting, Cuff Size: Normal)   Pulse 89   Temp (!) 97.3 F (36.3 C) (Tympanic)   Resp 12  No evidence of adenopathy in the head and neck region is noted.  Oral cavity is clear.  Well-developed well-nourished patient in NAD. HEENT reveals PERLA, EOMI, discs not visualized.  Oral cavity is clear. No oral mucosal lesions are identified. Neck is clear without evidence of cervical or supraclavicular adenopathy. Lungs are clear to A&P. Cardiac examination is essentially unremarkable with  regular rate and rhythm without murmur rub or thrill. Abdomen is benign with no organomegaly or masses noted. Motor sensory and DTR levels are equal and symmetric in the upper and lower extremities. Cranial nerves II through XII are grossly intact. Proprioception is intact. No peripheral adenopathy or edema is identified. No motor or sensory levels are noted. Crude visual fields are within normal range.  LABORATORY DATA: Labs reviewed possible pathology from Dr. Scherrie Merritts biopsy will be reviewed when available 6    RADIOLOGY RESULTS: CT scans and MRI scans reviewed as well as PET scan all compatible with above-stated findings   IMPRESSION: Recurrent nasopharyngeal cancer in 77 year old male treated back in 22 for locally Vance stage IVa disease.  PLAN: At this time elect to go ahead with palliative radiation therapy to his nasopharynx in a salvage mode.  Will plan on delivering 60 Gray in 30 fractions using IMRT treatment planning and delivery.  I would use IMRT to spare limited treatment outside of the hypermetabolic activity in the nasopharynx sparing his spinal cord salivary glands and previously irradiated regions.  Risks and benefits of treatment: Possible dysphagia possible fatigue alteration blood counts all were discussed in detail with the patient and his daughter.  They both seem to comprehend my treatment plan well.  I have personally set up and ordered CT simulation next week.  Will review pathology if it becomes available.  Patient and daughter both comprehend the treatment plan well.  I would like to take this opportunity to thank you for allowing me to participate in the care of your patient.Carmina Miller, MD

## 2023-03-31 ENCOUNTER — Telehealth: Payer: Self-pay | Admitting: *Deleted

## 2023-03-31 NOTE — Telephone Encounter (Signed)
Brandi with [facility] called to report that patient has been referred to Hospice and they should be going to do a conversational visit with him today. She is aware that patient will be getting Palliative radiation therapy. She thinks he was referred to [facility]

## 2023-04-01 ENCOUNTER — Other Ambulatory Visit: Payer: Self-pay

## 2023-04-03 ENCOUNTER — Ambulatory Visit
Admission: RE | Admit: 2023-04-03 | Discharge: 2023-04-03 | Disposition: A | Discharge status: Home / Self Care | Payer: Medicare Other | Source: Ambulatory Visit | Attending: Radiation Oncology | Admitting: Radiation Oncology

## 2023-04-03 DIAGNOSIS — Z79899 Other long term (current) drug therapy: Secondary | ICD-10-CM | POA: Diagnosis not present

## 2023-04-03 DIAGNOSIS — I10 Essential (primary) hypertension: Secondary | ICD-10-CM | POA: Insufficient documentation

## 2023-04-03 DIAGNOSIS — Z85828 Personal history of other malignant neoplasm of skin: Secondary | ICD-10-CM | POA: Diagnosis not present

## 2023-04-03 DIAGNOSIS — E119 Type 2 diabetes mellitus without complications: Secondary | ICD-10-CM | POA: Insufficient documentation

## 2023-04-03 DIAGNOSIS — E785 Hyperlipidemia, unspecified: Secondary | ICD-10-CM | POA: Diagnosis not present

## 2023-04-03 DIAGNOSIS — F17290 Nicotine dependence, other tobacco product, uncomplicated: Secondary | ICD-10-CM | POA: Diagnosis not present

## 2023-04-03 DIAGNOSIS — Z8673 Personal history of transient ischemic attack (TIA), and cerebral infarction without residual deficits: Secondary | ICD-10-CM | POA: Insufficient documentation

## 2023-04-03 DIAGNOSIS — Z7984 Long term (current) use of oral hypoglycemic drugs: Secondary | ICD-10-CM | POA: Insufficient documentation

## 2023-04-03 DIAGNOSIS — Z7951 Long term (current) use of inhaled steroids: Secondary | ICD-10-CM | POA: Insufficient documentation

## 2023-04-03 DIAGNOSIS — Z923 Personal history of irradiation: Secondary | ICD-10-CM | POA: Insufficient documentation

## 2023-04-03 DIAGNOSIS — C119 Malignant neoplasm of nasopharynx, unspecified: Secondary | ICD-10-CM | POA: Insufficient documentation

## 2023-04-03 DIAGNOSIS — Z7982 Long term (current) use of aspirin: Secondary | ICD-10-CM | POA: Insufficient documentation

## 2023-04-03 DIAGNOSIS — Z7902 Long term (current) use of antithrombotics/antiplatelets: Secondary | ICD-10-CM | POA: Diagnosis not present

## 2023-04-07 ENCOUNTER — Other Ambulatory Visit: Payer: Self-pay | Admitting: *Deleted

## 2023-04-07 DIAGNOSIS — C119 Malignant neoplasm of nasopharynx, unspecified: Secondary | ICD-10-CM

## 2023-04-10 DIAGNOSIS — C119 Malignant neoplasm of nasopharynx, unspecified: Secondary | ICD-10-CM | POA: Diagnosis not present

## 2023-04-11 ENCOUNTER — Ambulatory Visit: Admission: RE | Admit: 2023-04-11 | Payer: Medicare Other | Source: Ambulatory Visit

## 2023-04-12 ENCOUNTER — Ambulatory Visit: Payer: Medicare Other

## 2023-04-13 ENCOUNTER — Other Ambulatory Visit: Payer: Self-pay

## 2023-04-13 ENCOUNTER — Ambulatory Visit
Admission: RE | Admit: 2023-04-13 | Discharge: 2023-04-13 | Disposition: A | Discharge status: Home / Self Care | Payer: Medicare Other | Source: Ambulatory Visit | Attending: Radiation Oncology | Admitting: Radiation Oncology

## 2023-04-13 DIAGNOSIS — C119 Malignant neoplasm of nasopharynx, unspecified: Secondary | ICD-10-CM | POA: Diagnosis not present

## 2023-04-13 LAB — RAD ONC ARIA SESSION SUMMARY
Course Elapsed Days: 0
Plan Fractions Treated to Date: 1
Plan Prescribed Dose Per Fraction: 2 Gy
Plan Total Fractions Prescribed: 30
Plan Total Prescribed Dose: 60 Gy
Reference Point Dosage Given to Date: 2 Gy
Reference Point Session Dosage Given: 2 Gy
Session Number: 1

## 2023-04-14 ENCOUNTER — Other Ambulatory Visit: Payer: Self-pay

## 2023-04-14 ENCOUNTER — Ambulatory Visit
Admission: RE | Admit: 2023-04-14 | Discharge: 2023-04-14 | Disposition: A | Discharge status: Home / Self Care | Payer: Medicare Other | Source: Ambulatory Visit | Attending: Radiation Oncology | Admitting: Radiation Oncology

## 2023-04-14 DIAGNOSIS — C119 Malignant neoplasm of nasopharynx, unspecified: Secondary | ICD-10-CM | POA: Diagnosis not present

## 2023-04-14 LAB — RAD ONC ARIA SESSION SUMMARY
Course Elapsed Days: 1
Plan Fractions Treated to Date: 2
Plan Prescribed Dose Per Fraction: 2 Gy
Plan Total Fractions Prescribed: 30
Plan Total Prescribed Dose: 60 Gy
Reference Point Dosage Given to Date: 4 Gy
Reference Point Session Dosage Given: 2 Gy
Session Number: 2

## 2023-04-17 ENCOUNTER — Ambulatory Visit: Payer: Medicare Other

## 2023-04-18 ENCOUNTER — Ambulatory Visit
Admission: RE | Admit: 2023-04-18 | Discharge: 2023-04-18 | Disposition: A | Discharge status: Home / Self Care | Payer: Medicare Other | Source: Ambulatory Visit | Attending: Radiation Oncology | Admitting: Radiation Oncology

## 2023-04-18 ENCOUNTER — Other Ambulatory Visit: Payer: Self-pay

## 2023-04-18 ENCOUNTER — Encounter: Payer: Self-pay | Admitting: Internal Medicine

## 2023-04-18 ENCOUNTER — Inpatient Hospital Stay: Payer: Medicare Other

## 2023-04-18 ENCOUNTER — Inpatient Hospital Stay: Payer: Medicare Other | Admitting: Internal Medicine

## 2023-04-18 VITALS — BP 134/84 | HR 81 | Temp 97.6°F | Resp 20 | Wt 127.0 lb

## 2023-04-18 DIAGNOSIS — Z923 Personal history of irradiation: Secondary | ICD-10-CM | POA: Insufficient documentation

## 2023-04-18 DIAGNOSIS — Z8673 Personal history of transient ischemic attack (TIA), and cerebral infarction without residual deficits: Secondary | ICD-10-CM | POA: Insufficient documentation

## 2023-04-18 DIAGNOSIS — Z79899 Other long term (current) drug therapy: Secondary | ICD-10-CM | POA: Insufficient documentation

## 2023-04-18 DIAGNOSIS — C119 Malignant neoplasm of nasopharynx, unspecified: Secondary | ICD-10-CM | POA: Insufficient documentation

## 2023-04-18 DIAGNOSIS — K5900 Constipation, unspecified: Secondary | ICD-10-CM | POA: Insufficient documentation

## 2023-04-18 DIAGNOSIS — Z9221 Personal history of antineoplastic chemotherapy: Secondary | ICD-10-CM | POA: Insufficient documentation

## 2023-04-18 LAB — RAD ONC ARIA SESSION SUMMARY
Course Elapsed Days: 5
Plan Fractions Treated to Date: 3
Plan Prescribed Dose Per Fraction: 2 Gy
Plan Total Fractions Prescribed: 30
Plan Total Prescribed Dose: 60 Gy
Reference Point Dosage Given to Date: 6 Gy
Reference Point Session Dosage Given: 2 Gy
Session Number: 3

## 2023-04-18 LAB — CBC WITH DIFFERENTIAL (CANCER CENTER ONLY)
Abs Immature Granulocytes: 0.02 10*3/uL (ref 0.00–0.07)
Basophils Absolute: 0 10*3/uL (ref 0.0–0.1)
Basophils Relative: 1 %
Eosinophils Absolute: 0.1 10*3/uL (ref 0.0–0.5)
Eosinophils Relative: 1 %
HCT: 44.4 % (ref 39.0–52.0)
Hemoglobin: 13.6 g/dL (ref 13.0–17.0)
Immature Granulocytes: 0 %
Lymphocytes Relative: 17 %
Lymphs Abs: 1 10*3/uL (ref 0.7–4.0)
MCH: 26 pg (ref 26.0–34.0)
MCHC: 30.6 g/dL (ref 30.0–36.0)
MCV: 84.9 fL (ref 80.0–100.0)
Monocytes Absolute: 0.4 10*3/uL (ref 0.1–1.0)
Monocytes Relative: 7 %
Neutro Abs: 4.3 10*3/uL (ref 1.7–7.7)
Neutrophils Relative %: 74 %
Platelet Count: 304 10*3/uL (ref 150–400)
RBC: 5.23 MIL/uL (ref 4.22–5.81)
RDW: 15.9 % — ABNORMAL HIGH (ref 11.5–15.5)
WBC Count: 5.8 10*3/uL (ref 4.0–10.5)
nRBC: 0 % (ref 0.0–0.2)

## 2023-04-18 LAB — CMP (CANCER CENTER ONLY)
ALT: 16 U/L (ref 0–44)
AST: 21 U/L (ref 15–41)
Albumin: 4.2 g/dL (ref 3.5–5.0)
Alkaline Phosphatase: 119 U/L (ref 38–126)
Anion gap: 10 (ref 5–15)
BUN: 16 mg/dL (ref 8–23)
CO2: 28 mmol/L (ref 22–32)
Calcium: 9.5 mg/dL (ref 8.9–10.3)
Chloride: 98 mmol/L (ref 98–111)
Creatinine: 0.84 mg/dL (ref 0.61–1.24)
GFR, Estimated: >60 mL/min (ref >60)
Glucose, Bld: 158 mg/dL — ABNORMAL HIGH (ref 70–99)
Potassium: 4.5 mmol/L (ref 3.5–5.1)
Sodium: 136 mmol/L (ref 135–145)
Total Bilirubin: 0.3 mg/dL (ref 0.3–1.2)
Total Protein: 7.8 g/dL (ref 6.5–8.1)

## 2023-04-18 LAB — TSH: TSH: 1.975 u[IU]/mL (ref 0.350–4.500)

## 2023-04-18 MED ORDER — PROCHLORPERAZINE MALEATE 10 MG PO TABS: 10.0000 mg | ORAL_TABLET | Freq: Four times a day (QID) | ORAL | 1 refills | Status: DC | PRN

## 2023-04-18 NOTE — Progress Notes (Signed)
[facility] OFF[facility] PROGRESS NOTE  Patient Care Team: Kenneth Nicks, NP as PCP - General Kenneth Murders, MD (Otolaryngology) [facility] Kenneth Poling, NP (Inactive) as Nurse Practitioner (Hospice and Palliative Medicine) Kenneth Posner, MD as Referring Physician (Geriatric Medicine) Kenneth Dooms, NP as Nurse Practitioner (Nurse Practitioner) Kenneth Carrow, RN as Registered Nurse Kenneth Coder, MD as Consulting Physician (Oncology)  SUMMARY OF ONCOLOGIC HISTORY: Oncology History  Nasopharyngeal cancer [facility])  08/18/2020 Initial Diagnosis   Nasopharyngeal carcinoma (HCC)   08/18/2020 Cancer Staging   Staging form: Pharynx - Nasopharynx, AJCC 8th Edition - Clinical stage from 08/18/2020: Stage III (cT3, cN0, cM0) - Signed by Kenneth Bath, MD on 02/17/2021 Histopathologic type: Squamous cell carcinoma, keratinizing, NOS Stage prefix: Initial diagnosis ECOG performance status: Grade 1 Stage used in treatment planning: Yes National guidelines used in treatment planning: Yes Type of national guideline used in treatment planning: NCCN   08/24/2020 - 10/20/2020 Chemotherapy         12/31/2020 -  Chemotherapy    Patient is on Treatment Plan: HEAD/NECK NASOPHARYNGEAL ADJUVANT CISPLATIN D1 + 5FU IVCI D1-5 Q28D       INTERVAL HISTORY: in wheel chair; with care giver.   77 year old male patient with a history of recent stroke/debility weight loss ; and recurrence of nasopharyngeal cancer noted on recent April 2024 PET scan is here for follow-up.  In the interim patient underwent evaluation with ENT Dr. Colonel [facility].  No biopsy done.  Patient s/p evaluation with radiation oncology and is currently on salvage radiation until June 28th, 2024.   Patient states he is waking up everyday nausea. Patient states he is taking is nausea medications first thing in the morning and also throughout the day.   Patient states he is constipated.    Patient caregiver was was  wondering if Radiation could make him nausea like this.   Patient states he has no appetite and only eats ice cream.  Patient states he is having numbness in his feet. Patient caregiver states sometimes when taking off patient shoes patient doesn't even know they are off because he not having feeling in his feet some days.   Review of Systems  Constitutional:  Positive for malaise/fatigue and weight loss. Negative for chills, diaphoresis and fever.  HENT:  Positive for hearing loss. Negative for nosebleeds and sore throat.   Eyes:  Negative for double vision.  Respiratory:  Negative for cough, hemoptysis, sputum production, shortness of breath and wheezing.   Cardiovascular:  Negative for chest pain, palpitations, orthopnea and leg swelling.  Gastrointestinal:  Positive for nausea. Negative for abdominal pain, blood in stool, constipation, diarrhea, heartburn, melena and vomiting.  Genitourinary:  Negative for dysuria, frequency and urgency.  Musculoskeletal:  Positive for back pain and joint pain.  Skin: Negative.  Negative for itching and rash.  Neurological:  Negative for dizziness, tingling, focal weakness, weakness and headaches.  Endo/Heme/Allergies:  Does not bruise/bleed easily.  Psychiatric/Behavioral:  Negative for depression. The patient is not nervous/anxious and does not have insomnia.      ALLERGIES:  is allergic to propoxyphene.  MEDICATIONS:  Current Outpatient Medications  Medication Sig Dispense Refill   acetaminophen (TYLENOL) 325 MG tablet Take 975 mg by mouth every 6 (six) hours as needed.     ADVAIR HFA 115-21 MCG/ACT inhaler Inhale 1 puff into the lungs 2 (two) times daily.     albuterol (PROVENTIL) (2.5 MG/3ML) 0.083% nebulizer solution      Albuterol Sulfate, sensor, 108 (  90 Base) MCG/ACT AEPB Inhale 1-2 puffs into the lungs every 6 (six) hours as needed (Wheezing or Shortness of breath).     aspirin EC 81 MG tablet Take 81 mg by mouth daily. Swallow whole.      atorvastatin (LIPITOR) 80 MG tablet Take 1 tablet (80 mg total) by mouth daily.     Cholecalciferol (QC VITAMIN D3) 50 MCG (2000 UT) CAPS Take 2,000 Units by mouth daily.     clopidogrel (PLAVIX) 75 MG tablet Take 75 mg by mouth every morning.     cyproheptadine (PERIACTIN) 4 MG tablet Take 1 tablet by mouth daily.     DULoxetine (CYMBALTA) 30 MG capsule Take 30 mg by mouth daily.     famotidine (PEPCID) 40 MG tablet Take 40 mg by mouth daily.     feeding supplement (ENSURE ENLIVE / ENSURE PLUS) LIQD Take 237 mLs by mouth 3 (three) times daily between meals. 237 mL 12   folic acid (FOLVITE) 1 MG tablet Take 1 mg by mouth daily.     glipiZIDE (GLUCOTROL) 5 MG tablet Take 1 tablet by mouth daily.     linaclotide (LINZESS) 72 MCG capsule Hold while in nursing facility.     Multiple Vitamin (MULTIVITAMIN WITH MINERALS) TABS tablet Take 1 tablet by mouth daily.     nicotine (NICODERM CQ - DOSED IN MG/24 HOURS) 14 mg/24hr patch Place 1 patch (14 mg total) onto the skin daily. 28 patch 0   ondansetron (ZOFRAN-ODT) 4 MG disintegrating tablet Take 8 mg by mouth every 8 (eight) hours as needed.     oxyCODONE (OXY IR/ROXICODONE) 5 MG immediate release tablet Take 1 tablet (5 mg total) by mouth 2 (two) times daily as needed. Home med. 20 tablet 0   pantoprazole (PROTONIX) 40 MG tablet Take 40 mg by mouth daily.     pilocarpine (SALAGEN) 5 MG tablet Take 5 mg by mouth 3 (three) times daily.     polyethylene glycol powder (GLYCOLAX/MIRALAX) 17 GM/SCOOP powder Take 17 g by mouth in the morning and at bedtime.  0   polyvinyl alcohol (LIQUIFILM TEARS) 1.4 % ophthalmic solution Apply to eye.     sodium chloride (OCEAN) 0.65 % nasal spray Place into the nose.     prochlorperazine (COMPAZINE) 10 MG tablet Take 1 tablet (10 mg total) by mouth every 6 (six) hours as needed (Nausea or vomiting). 60 tablet 1   No current facility-administered medications for this visit.   Facility-Administered Medications Ordered  in Other Visits  Medication Dose Route Frequency Provider Last Rate Last Admin   sodium chloride flush (NS) 0.9 % injection 10 mL  10 mL Intravenous PRN [facility] C, MD   10 mL at 12/03/20 1138    PHYSICAL EXAMINATION:   Vitals:   04/18/23 1518  BP: 134/84  Pulse: 81  Resp: 20  Temp: 97.6 F (36.4 C)  SpO2: 100%     Filed Weights   04/18/23 1518  Weight: 127 lb (57.6 kg)     Physical Exam Vitals and nursing note reviewed.  HENT:     Head: Normocephalic and atraumatic.     Mouth/Throat:     Pharynx: Oropharynx is clear.  Eyes:     Extraocular Movements: Extraocular movements intact.     Pupils: Pupils are equal, round, and reactive to light.  Cardiovascular:     Rate and Rhythm: Normal rate and regular rhythm.  Pulmonary:     Comments: Decreased breath sounds bilaterally.  Abdominal:  Palpations: Abdomen is soft.  Musculoskeletal:        General: Normal range of motion.     Cervical back: Normal range of motion.  Skin:    General: Skin is warm.  Neurological:     General: No focal deficit present.     Mental Status: He is alert and oriented to person, place, and time.  Psychiatric:        Behavior: Behavior normal.        Judgment: Judgment normal.      LABORATORY DATA:  I have reviewed the data as listed    Component Value Date/Time   NA 136 04/18/2023 1506   NA 137 12/15/2015 1056   K 4.5 04/18/2023 1506   CL 98 04/18/2023 1506   CO2 28 04/18/2023 1506   GLUCOSE 158 (H) 04/18/2023 1506   BUN 16 04/18/2023 1506   BUN 8 12/15/2015 1056   CREATININE 0.84 04/18/2023 1506   CREATININE 1.07 06/16/2016 1632   CALCIUM 9.5 04/18/2023 1506   PROT 7.8 04/18/2023 1506   PROT 6.5 12/15/2015 1056   ALBUMIN 4.2 04/18/2023 1506   ALBUMIN 4.2 12/15/2015 1056   AST 21 04/18/2023 1506   ALT 16 04/18/2023 1506   ALKPHOS 119 04/18/2023 1506   BILITOT 0.3 04/18/2023 1506   GFRNONAA >60 04/18/2023 1506   GFRNONAA 71 06/16/2016 1632   GFRAA >60  08/18/2020 1129   GFRAA 82 06/16/2016 1632    No results found for: "SPEP", "UPEP"  Lab Results  Component Value Date   WBC 5.8 04/18/2023   NEUTROABS 4.3 04/18/2023   HGB 13.6 04/18/2023   HCT 44.4 04/18/2023   MCV 84.9 04/18/2023   PLT 304 04/18/2023      Chemistry      Component Value Date/Time   NA 136 04/18/2023 1506   NA 137 12/15/2015 1056   K 4.5 04/18/2023 1506   CL 98 04/18/2023 1506   CO2 28 04/18/2023 1506   BUN 16 04/18/2023 1506   BUN 8 12/15/2015 1056   CREATININE 0.84 04/18/2023 1506   CREATININE 1.07 06/16/2016 1632      Component Value Date/Time   CALCIUM 9.5 04/18/2023 1506   ALKPHOS 119 04/18/2023 1506   AST 21 04/18/2023 1506   ALT 16 04/18/2023 1506   BILITOT 0.3 04/18/2023 1506       No results found for: "CA125", "CEA", "PSA", "CA199", "CA2729", "AFP"   RADIOGRAPHIC STUDIES: I have personally reviewed the radiological images as listed and agreed with the findings in the report. No results found.   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 S/p 5 weeks of concurrent cisplatin and radiation [shortened x 3 fractions d/t mucositis].  Patient declined adjuvant chemotherapy.  Completed 10/24/20. [Dr.Juengel].  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 No findings suspicious for metastatic disease. Currently undergoing salvage RT until 6/28.   # Currently undergoing salvage RT until 6/28- tolerating with mild to moderate side effects- nausea etc. had a long discussion regarding the potential upcoming side effects of weight loss/radiation mucositis etc.  Would recommend monitoring closely.  Patient high risk of decompensation given his poor baseline performance status and comorbidities.  # Nausea- sect to RT vs other- continue zofran ODT;  recommend adding Compazine- new scipt sent.    # Constipation-recommend supportive care with MiraLAX Dulcolax etc.  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  S/p evaluation with Per Neuro-improving. Stable.   # DISPOSITION:  # follow up in 3 weeks- APP- labs- cbc/cmp;  possible IVFs over 1 hour # follow up in 2 months- MD; labs- cbc/cmp; possible IVFs over 1 hour- Dr.B     No orders of the defined types were placed in this encounter.  All questions were answered. The patient knows to call the clinic with any problems, questions or concerns. No barriers to learning was detected.    Kenneth Coder, MD 04/18/2023 3:51 PM

## 2023-04-18 NOTE — Assessment & Plan Note (Addendum)
#   T3NxMx/stage 3 nasopharyngeal carcinoma- unresectable. S/p 5 weeks of concurrent cisplatin and radiation [shortened x 3 fractions d/t mucositis].  Patient declined adjuvant chemotherapy.  Completed 10/24/20. [Dr.Juengel].  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 No findings suspicious for metastatic disease. Currently undergoing salvage RT until 6/28.   # Currently undergoing salvage RT until 6/28- tolerating with mild to moderate side effects- nausea etc. had a long discussion regarding the potential upcoming side effects of weight loss/radiation mucositis etc.  Would recommend monitoring closely.  Patient high risk of decompensation given his poor baseline performance status and comorbidities.  # Nausea- sect to RT vs other- continue zofran ODT;  recommend adding Compazine- new scipt sent.   # Constipation-recommend supportive care with MiraLAX Dulcolax etc.  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  S/p evaluation with Per Neuro-improving. Stable.   # DISPOSITION:  # follow up in 3 weeks- APP- labs- cbc/cmp;  possible IVFs over 1 hour # follow up in 2 months- MD; labs- cbc/cmp; possible IVFs over 1 hour- Dr.B

## 2023-04-18 NOTE — Addendum Note (Signed)
Addended by: Darrold Span A on: 04/18/2023 04:12 PM   Modules accepted: Orders

## 2023-04-18 NOTE — Progress Notes (Signed)
Patient states he is waking up everyday nausea. Patient states he is taking is nausea medications first thing in the morning and also throughout the day. Patient states he is constipated.  Patient caregiver was was wondering if Radiation could make him nausea like this. Patient states he has no appetite and only eats ice cream. Patient states he is constipated.  Patient states he is having numbness in his feet. Patient caregiver states sometimes when taking off patient shoes patient doesn't even know they are off because he not having feeling in his feet some days.

## 2023-04-19 ENCOUNTER — Ambulatory Visit: Payer: Medicare Other

## 2023-04-19 ENCOUNTER — Other Ambulatory Visit: Payer: Self-pay

## 2023-04-19 DIAGNOSIS — C119 Malignant neoplasm of nasopharynx, unspecified: Secondary | ICD-10-CM

## 2023-04-20 ENCOUNTER — Inpatient Hospital Stay (HOSPITAL_BASED_OUTPATIENT_CLINIC_OR_DEPARTMENT_OTHER): Payer: Medicare Other | Admitting: Nurse Practitioner

## 2023-04-20 ENCOUNTER — Other Ambulatory Visit: Payer: Self-pay

## 2023-04-20 ENCOUNTER — Inpatient Hospital Stay: Payer: Medicare Other

## 2023-04-20 ENCOUNTER — Encounter: Payer: Self-pay | Admitting: Nurse Practitioner

## 2023-04-20 ENCOUNTER — Ambulatory Visit
Admission: RE | Admit: 2023-04-20 | Discharge: 2023-04-20 | Disposition: A | Discharge status: Home / Self Care | Payer: Medicare Other | Source: Ambulatory Visit | Attending: Radiation Oncology | Admitting: Radiation Oncology

## 2023-04-20 ENCOUNTER — Ambulatory Visit: Payer: Medicare Other

## 2023-04-20 ENCOUNTER — Encounter: Payer: Medicare Other | Admitting: Nurse Practitioner

## 2023-04-20 ENCOUNTER — Other Ambulatory Visit: Payer: Medicare Other

## 2023-04-20 VITALS — BP 133/78 | HR 91 | Temp 97.3°F | Wt 126.0 lb

## 2023-04-20 DIAGNOSIS — C119 Malignant neoplasm of nasopharynx, unspecified: Secondary | ICD-10-CM

## 2023-04-20 DIAGNOSIS — K219 Gastro-esophageal reflux disease without esophagitis: Secondary | ICD-10-CM | POA: Diagnosis not present

## 2023-04-20 LAB — COMPREHENSIVE METABOLIC PANEL
ALT: 16 U/L (ref 0–44)
AST: 20 U/L (ref 15–41)
Albumin: 4 g/dL (ref 3.5–5.0)
Alkaline Phosphatase: 120 U/L (ref 38–126)
Anion gap: 8 (ref 5–15)
BUN: 14 mg/dL (ref 8–23)
CO2: 29 mmol/L (ref 22–32)
Calcium: 9.4 mg/dL (ref 8.9–10.3)
Chloride: 99 mmol/L (ref 98–111)
Creatinine, Ser: 0.83 mg/dL (ref 0.61–1.24)
GFR, Estimated: >60 mL/min (ref >60)
Glucose, Bld: 182 mg/dL — ABNORMAL HIGH (ref 70–99)
Potassium: 4.2 mmol/L (ref 3.5–5.1)
Sodium: 136 mmol/L (ref 135–145)
Total Bilirubin: 0.4 mg/dL (ref 0.3–1.2)
Total Protein: 7.7 g/dL (ref 6.5–8.1)

## 2023-04-20 LAB — CBC WITH DIFFERENTIAL/PLATELET
Abs Immature Granulocytes: 0.02 10*3/uL (ref 0.00–0.07)
Basophils Absolute: 0 10*3/uL (ref 0.0–0.1)
Basophils Relative: 1 %
Eosinophils Absolute: 0.2 10*3/uL (ref 0.0–0.5)
Eosinophils Relative: 3 %
HCT: 41.6 % (ref 39.0–52.0)
Hemoglobin: 12.8 g/dL — ABNORMAL LOW (ref 13.0–17.0)
Immature Granulocytes: 0 %
Lymphocytes Relative: 18 %
Lymphs Abs: 1.1 10*3/uL (ref 0.7–4.0)
MCH: 26.3 pg (ref 26.0–34.0)
MCHC: 30.8 g/dL (ref 30.0–36.0)
MCV: 85.6 fL (ref 80.0–100.0)
Monocytes Absolute: 0.5 10*3/uL (ref 0.1–1.0)
Monocytes Relative: 9 %
Neutro Abs: 4.1 10*3/uL (ref 1.7–7.7)
Neutrophils Relative %: 69 %
Platelets: 297 10*3/uL (ref 150–400)
RBC: 4.86 MIL/uL (ref 4.22–5.81)
RDW: 15.9 % — ABNORMAL HIGH (ref 11.5–15.5)
WBC: 5.9 10*3/uL (ref 4.0–10.5)
nRBC: 0 % (ref 0.0–0.2)

## 2023-04-20 LAB — RAD ONC ARIA SESSION SUMMARY
Course Elapsed Days: 7
Plan Fractions Treated to Date: 4
Plan Prescribed Dose Per Fraction: 2 Gy
Plan Total Fractions Prescribed: 30
Plan Total Prescribed Dose: 60 Gy
Reference Point Dosage Given to Date: 8 Gy
Reference Point Session Dosage Given: 2 Gy
Session Number: 4

## 2023-04-20 LAB — MAGNESIUM: Magnesium: 2.1 mg/dL (ref 1.7–2.4)

## 2023-04-20 NOTE — Progress Notes (Signed)
[facility]  [facility] A Department of the [facility] [address] [place], [place] [address] [phone] (phone) [phone] (fax)  Patient Care Team: Etheleen Nicks, NP as PCP - General Vernie Murders, MD (Otolaryngology) Eliezer Lofts, NP (Inactive) as Nurse Practitioner (Hospice and Palliative Medicine) Rosetta Posner, MD as Referring Physician (Geriatric Medicine) Alinda Dooms, NP as Nurse Practitioner (Nurse Practitioner) Dorcas Carrow, RN as Registered Nurse Earna Coder, MD as Consulting Physician (Oncology)   Name of the patient: Kenneth Franklin  [id number]  December 09, 1945   Date of visit: 04/20/23  Diagnosis- Nasopharyngeal carcinoma   Chief complaint/ Reason for visit- abdominal discomfort and heartburn  Heme/Onc history:  Oncology History  Nasopharyngeal cancer (HCC)  08/18/2020 Initial Diagnosis   Nasopharyngeal carcinoma (HCC)   08/18/2020 Cancer Staging   Staging form: Pharynx - Nasopharynx, AJCC 8th Edition - Clinical stage from 08/18/2020: Stage III (cT3, cN0, cM0) - Signed by Rosey Bath, MD on 02/17/2021 Histopathologic type: Squamous cell carcinoma, keratinizing, NOS Stage prefix: Initial diagnosis ECOG performance status: Grade 1 Stage used in treatment planning: Yes National guidelines used in treatment planning: Yes Type of national guideline used in treatment planning: NCCN   08/24/2020 - 10/20/2020 Chemotherapy         12/31/2020 -  Chemotherapy    Patient is on Treatment Plan: HEAD/NECK NASOPHARYNGEAL ADJUVANT CISPLATIN D1 + 5FU IVCI D1-5 Q28D        Interval history- Patient is 77 year old male currently receiving salvage radiation for nasopharyngeal carcinoma who [facility] for concerns of abdominal pain, nausea, and acid reflux. Symptoms have been ongoing for years but worse in the early morning  after he is laying down. He has increased mucous in early morning but symptoms clear once he's upright and moving about. He denies weight loss. Says he feels at baseline. His step daughter Inetta Fermo assists with care. He says he cancelled radiation past 2 days due to feeling poorly but symptoms have now resolved and he feels at baseline. Denies any neurologic complaints. Denies recent fevers or illnesses. Denies any easy bleeding or bruising. Reports good appetite and denies weight loss. Denies chest pain. Denies any nausea, vomiting, constipation, or diarrhea. Denies urinary complaints. Patient offers no further specific complaints today.  Review of systems- Review of Systems  Constitutional:  Positive for malaise/fatigue. Negative for chills, fever and weight loss.  HENT:  Positive for congestion. Negative for hearing loss, nosebleeds, sore throat and tinnitus.   Eyes:  Negative for blurred vision and double vision.  Respiratory:  Negative for cough, hemoptysis, shortness of breath and wheezing.   Cardiovascular:  Negative for chest pain, palpitations and leg swelling.  Gastrointestinal:  Positive for heartburn and nausea. Negative for abdominal pain, blood in stool, constipation, diarrhea, melena and vomiting.  Genitourinary:  Negative for dysuria and urgency.  Musculoskeletal:  Negative for back pain, falls, joint pain and myalgias.  Skin:  Negative for itching and rash.  Neurological:  Positive for weakness (at baseline). Negative for dizziness, tingling, sensory change, loss of consciousness and headaches.  Endo/Heme/Allergies:  Negative for environmental allergies. Does not bruise/bleed easily.  Psychiatric/Behavioral:  Positive for memory loss. Negative for depression. The patient is not nervous/anxious and does not have insomnia.       Allergies  Allergen Reactions   Propoxyphene     Unknown reaction    Past Medical History:  Diagnosis Date  Acute ischemic stroke (HCC) 2017   Alcohol  abuse    Anxiety    Cancer (HCC)    Depression    Diabetes mellitus without complication (HCC)    Dyspnea    pcp knows and ordered rescue inhaler   Hyperlipidemia    Hypertension    Skin cancer    Squamous Cell Carcinoma In Situ    Past Surgical History:  Procedure Laterality Date   CATARACT EXTRACTION Bilateral    COLONOSCOPY     ESOPHAGOGASTRODUODENOSCOPY (EGD) WITH PROPOFOL N/A 09/26/2022   Procedure: ESOPHAGOGASTRODUODENOSCOPY (EGD) WITH PROPOFOL;  Surgeon: Regis Bill, MD;  Location: [facility] ENDOSCOPY;  Service: Endoscopy;  Laterality: N/A;   NASOPHARYNGOSCOPY N/A 08/12/2020   Procedure: ENDOSCOPIC NASOPHARYNGOSCOPY WITH BIOPSY;  Surgeon: Vernie Murders, MD;  Location: [facility] ORS;  Service: ENT;  Laterality: N/A;   PORTA CATH INSERTION N/A 08/24/2020   Procedure: PORTA CATH INSERTION;  Surgeon: Annice Needy, MD;  Location: [facility] INVASIVE CV LAB;  Service: Cardiovascular;  Laterality: N/A;   PORTA CATH REMOVAL N/A 07/22/2021   Procedure: PORTA CATH REMOVAL;  Surgeon: Annice Needy, MD;  Location: [facility] INVASIVE CV LAB;  Service: Cardiovascular;  Laterality: N/A;    Social History   Socioeconomic History   Marital status: Widowed    Spouse name: Not on file   Number of children: Not on file   Years of education: Not on file   Highest education level: Not on file  Occupational History   Not on file  Tobacco Use   Smoking status: Some Days    Packs/day: 1.00    Years: 40.00    Additional pack years: 0.00    Total pack years: 40.00    Types: Cigars, Cigarettes    Last attempt to quit: 08/12/2019    Years since quitting: 3.6   Smokeless tobacco: Never  Vaping Use   Vaping Use: Never used  Substance and Sexual Activity   Alcohol use: Not Currently    Comment: H/O ETOH ABUSE BUT DENIES DRINKING DURING 08-11-20 INTERVIEW   Drug use: No   Sexual activity: Never  Other Topics Concern   Not on file  Social History Narrative   Not on file   Social Determinants of Health    Financial Resource Strain: Not on file  Food Insecurity: Not on file  Transportation Needs: Not on file  Physical Activity: Not on file  Stress: Not on file  Social Connections: Not on file  Intimate Partner Violence: Not on file    Family History  Problem Relation Age of Onset   Diabetes Brother      Current Outpatient Medications:    acetaminophen (TYLENOL) 325 MG tablet, Take 975 mg by mouth every 6 (six) hours as needed., Disp: , Rfl:    ADVAIR HFA 115-21 MCG/ACT inhaler, Inhale 1 puff into the lungs 2 (two) times daily., Disp: , Rfl:    albuterol (PROVENTIL) (2.5 MG/3ML) 0.083% nebulizer solution, , Disp: , Rfl:    Albuterol Sulfate, sensor, 108 (90 Base) MCG/ACT AEPB, Inhale 1-2 puffs into the lungs every 6 (six) hours as needed (Wheezing or Shortness of breath)., Disp: , Rfl:    aspirin EC 81 MG tablet, Take 81 mg by mouth daily. Swallow whole., Disp: , Rfl:    atorvastatin (LIPITOR) 80 MG tablet, Take 1 tablet (80 mg total) by mouth daily., Disp: , Rfl:    Cholecalciferol (QC VITAMIN D3) 50 MCG (2000 UT) CAPS, Take 2,000 Units by mouth daily., Disp: ,  Rfl:    clopidogrel (PLAVIX) 75 MG tablet, Take 75 mg by mouth every morning., Disp: , Rfl:    cyproheptadine (PERIACTIN) 4 MG tablet, Take 1 tablet by mouth daily., Disp: , Rfl:    DULoxetine (CYMBALTA) 30 MG capsule, Take 30 mg by mouth daily., Disp: , Rfl:    famotidine (PEPCID) 40 MG tablet, Take 40 mg by mouth daily., Disp: , Rfl:    feeding supplement (ENSURE ENLIVE / ENSURE PLUS) LIQD, Take 237 mLs by mouth 3 (three) times daily between meals., Disp: 237 mL, Rfl: 12   folic acid (FOLVITE) 1 MG tablet, Take 1 mg by mouth daily., Disp: , Rfl:    glipiZIDE (GLUCOTROL) 5 MG tablet, Take 1 tablet by mouth daily., Disp: , Rfl:    linaclotide (LINZESS) 72 MCG capsule, Hold while in nursing facility., Disp: , Rfl:    Multiple Vitamin (MULTIVITAMIN WITH MINERALS) TABS tablet, Take 1 tablet by mouth daily., Disp: , Rfl:     nicotine (NICODERM CQ - DOSED IN MG/24 HOURS) 14 mg/24hr patch, Place 1 patch (14 mg total) onto the skin daily., Disp: 28 patch, Rfl: 0   ondansetron (ZOFRAN-ODT) 4 MG disintegrating tablet, Take 8 mg by mouth every 8 (eight) hours as needed., Disp: , Rfl:    oxyCODONE (OXY IR/ROXICODONE) 5 MG immediate release tablet, Take 1 tablet (5 mg total) by mouth 2 (two) times daily as needed. Home med., Disp: 20 tablet, Rfl: 0   pantoprazole (PROTONIX) 40 MG tablet, Take 40 mg by mouth daily., Disp: , Rfl:    pilocarpine (SALAGEN) 5 MG tablet, Take 5 mg by mouth 3 (three) times daily., Disp: , Rfl:    polyethylene glycol powder (GLYCOLAX/MIRALAX) 17 GM/SCOOP powder, Take 17 g by mouth in the morning and at bedtime., Disp: , Rfl: 0   polyvinyl alcohol (LIQUIFILM TEARS) 1.4 % ophthalmic solution, Apply to eye., Disp: , Rfl:    prochlorperazine (COMPAZINE) 10 MG tablet, Take 1 tablet (10 mg total) by mouth every 6 (six) hours as needed (Nausea or vomiting)., Disp: 60 tablet, Rfl: 1   sodium chloride (OCEAN) 0.65 % nasal spray, Place into the nose., Disp: , Rfl:  No current facility-administered medications for this visit.  Facility-Administered Medications Ordered in Other Visits:    sodium chloride flush (NS) 0.9 % injection 10 mL, 10 mL, Intravenous, PRN, Merlene Pulling, Melissa C, MD, 10 mL at 12/03/20 1138  Physical exam:  Vitals:   04/20/23 1045  BP: 133/78  Pulse: 91  Temp: (!) 97.3 F (36.3 C)  SpO2: 100%  Weight: 126 lb (57.2 kg)   Physical Exam Constitutional:      General: He is not in acute distress.    Comments: Frail appearing. Thin build. In wheelchair. Unaccompanied.   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     Mouth/Throat:     Mouth: Mucous membranes are moist.     Pharynx: No oropharyngeal exudate.  Eyes:     General: No scleral icterus. Cardiovascular:     Rate and Rhythm: Normal rate and  regular rhythm.  Pulmonary:     Effort: No respiratory distress.  Musculoskeletal:        General: No deformity.  Skin:    General: Skin is warm and dry.     Coloration: Skin is not pale.  Neurological:     Mental Status: He is  alert. Mental status is at baseline.  Psychiatric:        Mood and Affect: Mood normal.        Behavior: Behavior normal.         Latest Ref Rng & Units 04/20/2023   10:16 AM  CMP  Glucose 70 - 99 mg/dL 191   BUN 8 - 23 mg/dL 14   Creatinine 4.78 - 1.24 mg/dL 2.95   Sodium 621 - 308 mmol/L 136   Potassium 3.5 - 5.1 mmol/L 4.2   Chloride 98 - 111 mmol/L 99   CO2 22 - 32 mmol/L 29   Calcium 8.9 - 10.3 mg/dL 9.4   Total Protein 6.5 - 8.1 g/dL 7.7   Total Bilirubin 0.3 - 1.2 mg/dL 0.4   Alkaline Phos 38 - 126 U/L 120   AST 15 - 41 U/L 20   ALT 0 - 44 U/L 16       Latest Ref Rng & Units 04/20/2023   10:16 AM  CBC  WBC 4.0 - 10.5 K/uL 5.9   Hemoglobin 13.0 - 17.0 g/dL 65.7   Hematocrit 84.6 - 52.0 % 41.6   Platelets 150 - 400 K/uL 297     No images are attached to the encounter.  No results found.  Assessment and plan- Patient is a 77 y.o. male diagnosed with nasopharyngeal carcinoma, currently undergoing salvage RT until 6/28 who preseents to [facility] for   Nausea- secondary to radiation & acid reflux most likely. Discussed elevating pillow/incline. Continue zofran odt. Has not yet started compazine but daughter will pick up today. Continue protonix for acid reflux & gerd.  Congestion- likely related to radiation. Stay well hydrated. No evidence of dehydration today. Encouraged frequent sipping of fluids to help thin secretions though none observed today.  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  S/p evaluation with [facility] Neuro-improving per Inetta Fermo.    I spoke to his daughter Inetta Fermo by phone who feels that he is doing well and denies concerns today. Labs  reviewed and reassuring. No evidence of dehydration so we'll hold fluids.    Proceed with radiation today. RTC in interim if symptoms do not improve or worsen.   Visit Diagnosis 1. Nasopharyngeal cancer (HCC)   2. GERD without esophagitis    Patient expressed understanding and was in agreement with this plan. He also understands that He can call clinic at any time with any questions, concerns, or complaints.   Thank you for allowing me to participate in the care of this very pleasant patient.   Consuello Masse, DNP, AGNP-C, AOCNP [facility] [phone]

## 2023-04-21 ENCOUNTER — Ambulatory Visit
Admission: RE | Admit: 2023-04-21 | Discharge: 2023-04-21 | Disposition: A | Discharge status: Home / Self Care | Payer: Medicare Other | Source: Ambulatory Visit | Attending: Radiation Oncology | Admitting: Radiation Oncology

## 2023-04-21 ENCOUNTER — Other Ambulatory Visit: Payer: Self-pay

## 2023-04-21 DIAGNOSIS — C119 Malignant neoplasm of nasopharynx, unspecified: Secondary | ICD-10-CM | POA: Diagnosis not present

## 2023-04-21 LAB — RAD ONC ARIA SESSION SUMMARY
Course Elapsed Days: 8
Plan Fractions Treated to Date: 5
Plan Prescribed Dose Per Fraction: 2 Gy
Plan Total Fractions Prescribed: 30
Plan Total Prescribed Dose: 60 Gy
Reference Point Dosage Given to Date: 10 Gy
Reference Point Session Dosage Given: 2 Gy
Session Number: 5

## 2023-04-23 ENCOUNTER — Ambulatory Visit: Payer: Medicare Other

## 2023-04-25 ENCOUNTER — Other Ambulatory Visit: Payer: Self-pay

## 2023-04-25 ENCOUNTER — Ambulatory Visit
Admission: RE | Admit: 2023-04-25 | Discharge: 2023-04-25 | Disposition: A | Discharge status: Home / Self Care | Payer: Medicare Other | Source: Ambulatory Visit | Attending: Radiation Oncology | Admitting: Radiation Oncology

## 2023-04-25 ENCOUNTER — Inpatient Hospital Stay: Payer: Medicare Other

## 2023-04-25 DIAGNOSIS — C119 Malignant neoplasm of nasopharynx, unspecified: Secondary | ICD-10-CM | POA: Diagnosis not present

## 2023-04-25 LAB — RAD ONC ARIA SESSION SUMMARY
Course Elapsed Days: 12
Plan Fractions Treated to Date: 6
Plan Prescribed Dose Per Fraction: 2 Gy
Plan Total Fractions Prescribed: 30
Plan Total Prescribed Dose: 60 Gy
Reference Point Dosage Given to Date: 12 Gy
Reference Point Session Dosage Given: 2 Gy
Session Number: 6

## 2023-04-26 ENCOUNTER — Ambulatory Visit
Admission: RE | Admit: 2023-04-26 | Discharge: 2023-04-26 | Disposition: A | Discharge status: Home / Self Care | Payer: Medicare Other | Source: Ambulatory Visit | Attending: Radiation Oncology | Admitting: Radiation Oncology

## 2023-04-26 ENCOUNTER — Other Ambulatory Visit: Payer: Self-pay

## 2023-04-26 DIAGNOSIS — C119 Malignant neoplasm of nasopharynx, unspecified: Secondary | ICD-10-CM | POA: Diagnosis not present

## 2023-04-26 LAB — RAD ONC ARIA SESSION SUMMARY
Course Elapsed Days: 13
Plan Fractions Treated to Date: 7
Plan Prescribed Dose Per Fraction: 2 Gy
Plan Total Fractions Prescribed: 30
Plan Total Prescribed Dose: 60 Gy
Reference Point Dosage Given to Date: 14 Gy
Reference Point Session Dosage Given: 2 Gy
Session Number: 7

## 2023-04-27 ENCOUNTER — Other Ambulatory Visit: Payer: Self-pay

## 2023-04-27 ENCOUNTER — Ambulatory Visit
Admission: RE | Admit: 2023-04-27 | Discharge: 2023-04-27 | Disposition: A | Discharge status: Home / Self Care | Payer: Medicare Other | Source: Ambulatory Visit | Attending: Radiation Oncology | Admitting: Radiation Oncology

## 2023-04-27 DIAGNOSIS — C119 Malignant neoplasm of nasopharynx, unspecified: Secondary | ICD-10-CM | POA: Diagnosis not present

## 2023-04-27 LAB — RAD ONC ARIA SESSION SUMMARY
Course Elapsed Days: 14
Plan Fractions Treated to Date: 8
Plan Prescribed Dose Per Fraction: 2 Gy
Plan Total Fractions Prescribed: 30
Plan Total Prescribed Dose: 60 Gy
Reference Point Dosage Given to Date: 16 Gy
Reference Point Session Dosage Given: 2 Gy
Session Number: 8

## 2023-04-28 ENCOUNTER — Ambulatory Visit: Payer: Medicare Other

## 2023-05-01 ENCOUNTER — Ambulatory Visit
Admission: RE | Admit: 2023-05-01 | Discharge: 2023-05-01 | Disposition: A | Discharge status: Home / Self Care | Payer: Medicare Other | Source: Ambulatory Visit | Attending: Radiation Oncology | Admitting: Radiation Oncology

## 2023-05-01 ENCOUNTER — Other Ambulatory Visit: Payer: Self-pay

## 2023-05-01 DIAGNOSIS — Z7951 Long term (current) use of inhaled steroids: Secondary | ICD-10-CM | POA: Insufficient documentation

## 2023-05-01 DIAGNOSIS — Z923 Personal history of irradiation: Secondary | ICD-10-CM | POA: Diagnosis not present

## 2023-05-01 DIAGNOSIS — Z7902 Long term (current) use of antithrombotics/antiplatelets: Secondary | ICD-10-CM | POA: Insufficient documentation

## 2023-05-01 DIAGNOSIS — E119 Type 2 diabetes mellitus without complications: Secondary | ICD-10-CM | POA: Insufficient documentation

## 2023-05-01 DIAGNOSIS — C119 Malignant neoplasm of nasopharynx, unspecified: Secondary | ICD-10-CM | POA: Insufficient documentation

## 2023-05-01 DIAGNOSIS — Z7982 Long term (current) use of aspirin: Secondary | ICD-10-CM | POA: Insufficient documentation

## 2023-05-01 DIAGNOSIS — F17290 Nicotine dependence, other tobacco product, uncomplicated: Secondary | ICD-10-CM | POA: Diagnosis not present

## 2023-05-01 DIAGNOSIS — E785 Hyperlipidemia, unspecified: Secondary | ICD-10-CM | POA: Insufficient documentation

## 2023-05-01 DIAGNOSIS — Z8673 Personal history of transient ischemic attack (TIA), and cerebral infarction without residual deficits: Secondary | ICD-10-CM | POA: Insufficient documentation

## 2023-05-01 DIAGNOSIS — Z79899 Other long term (current) drug therapy: Secondary | ICD-10-CM | POA: Diagnosis not present

## 2023-05-01 DIAGNOSIS — Z85828 Personal history of other malignant neoplasm of skin: Secondary | ICD-10-CM | POA: Insufficient documentation

## 2023-05-01 DIAGNOSIS — Z7984 Long term (current) use of oral hypoglycemic drugs: Secondary | ICD-10-CM | POA: Insufficient documentation

## 2023-05-01 DIAGNOSIS — I10 Essential (primary) hypertension: Secondary | ICD-10-CM | POA: Diagnosis not present

## 2023-05-01 LAB — RAD ONC ARIA SESSION SUMMARY
Course Elapsed Days: 18
Plan Fractions Treated to Date: 9
Plan Prescribed Dose Per Fraction: 2 Gy
Plan Total Fractions Prescribed: 30
Plan Total Prescribed Dose: 60 Gy
Reference Point Dosage Given to Date: 18 Gy
Reference Point Session Dosage Given: 2 Gy
Session Number: 9

## 2023-05-02 ENCOUNTER — Ambulatory Visit: Payer: Medicare Other

## 2023-05-02 ENCOUNTER — Other Ambulatory Visit: Payer: Self-pay

## 2023-05-02 ENCOUNTER — Inpatient Hospital Stay: Payer: Medicare Other

## 2023-05-02 DIAGNOSIS — C119 Malignant neoplasm of nasopharynx, unspecified: Secondary | ICD-10-CM

## 2023-05-03 ENCOUNTER — Inpatient Hospital Stay: Payer: Medicare Other

## 2023-05-03 ENCOUNTER — Encounter: Payer: Self-pay | Admitting: Medical Oncology

## 2023-05-03 ENCOUNTER — Inpatient Hospital Stay (HOSPITAL_BASED_OUTPATIENT_CLINIC_OR_DEPARTMENT_OTHER): Payer: Medicare Other | Admitting: Medical Oncology

## 2023-05-03 ENCOUNTER — Ambulatory Visit
Admission: RE | Admit: 2023-05-03 | Discharge: 2023-05-03 | Disposition: A | Discharge status: Home / Self Care | Payer: Medicare Other | Source: Ambulatory Visit | Attending: Radiation Oncology | Admitting: Radiation Oncology

## 2023-05-03 ENCOUNTER — Other Ambulatory Visit: Payer: Self-pay

## 2023-05-03 VITALS — BP 128/82 | HR 91 | Temp 96.1°F | Wt 130.0 lb

## 2023-05-03 DIAGNOSIS — C119 Malignant neoplasm of nasopharynx, unspecified: Secondary | ICD-10-CM | POA: Insufficient documentation

## 2023-05-03 DIAGNOSIS — R110 Nausea: Secondary | ICD-10-CM

## 2023-05-03 DIAGNOSIS — R54 Age-related physical debility: Secondary | ICD-10-CM

## 2023-05-03 DIAGNOSIS — F17210 Nicotine dependence, cigarettes, uncomplicated: Secondary | ICD-10-CM | POA: Insufficient documentation

## 2023-05-03 DIAGNOSIS — K219 Gastro-esophageal reflux disease without esophagitis: Secondary | ICD-10-CM

## 2023-05-03 LAB — CMP (CANCER CENTER ONLY)
ALT: 12 U/L (ref 0–44)
AST: 17 U/L (ref 15–41)
Albumin: 3.9 g/dL (ref 3.5–5.0)
Alkaline Phosphatase: 127 U/L — ABNORMAL HIGH (ref 38–126)
Anion gap: 11 (ref 5–15)
BUN: 13 mg/dL (ref 8–23)
CO2: 29 mmol/L (ref 22–32)
Calcium: 9.6 mg/dL (ref 8.9–10.3)
Chloride: 98 mmol/L (ref 98–111)
Creatinine: 0.8 mg/dL (ref 0.61–1.24)
GFR, Estimated: >60 mL/min (ref >60)
Glucose, Bld: 124 mg/dL — ABNORMAL HIGH (ref 70–99)
Potassium: 4.6 mmol/L (ref 3.5–5.1)
Sodium: 138 mmol/L (ref 135–145)
Total Bilirubin: 0.3 mg/dL (ref 0.3–1.2)
Total Protein: 7.9 g/dL (ref 6.5–8.1)

## 2023-05-03 LAB — RAD ONC ARIA SESSION SUMMARY
Course Elapsed Days: 20
Plan Fractions Treated to Date: 10
Plan Prescribed Dose Per Fraction: 2 Gy
Plan Total Fractions Prescribed: 30
Plan Total Prescribed Dose: 60 Gy
Reference Point Dosage Given to Date: 20 Gy
Reference Point Session Dosage Given: 2 Gy
Session Number: 10

## 2023-05-03 LAB — CBC WITH DIFFERENTIAL (CANCER CENTER ONLY)
Abs Immature Granulocytes: 0.02 10*3/uL (ref 0.00–0.07)
Basophils Absolute: 0.1 10*3/uL (ref 0.0–0.1)
Basophils Relative: 1 %
Eosinophils Absolute: 0.2 10*3/uL (ref 0.0–0.5)
Eosinophils Relative: 3 %
HCT: 42.7 % (ref 39.0–52.0)
Hemoglobin: 13.3 g/dL (ref 13.0–17.0)
Immature Granulocytes: 0 %
Lymphocytes Relative: 16 %
Lymphs Abs: 1.1 10*3/uL (ref 0.7–4.0)
MCH: 26.2 pg (ref 26.0–34.0)
MCHC: 31.1 g/dL (ref 30.0–36.0)
MCV: 84.2 fL (ref 80.0–100.0)
Monocytes Absolute: 0.5 10*3/uL (ref 0.1–1.0)
Monocytes Relative: 8 %
Neutro Abs: 4.7 10*3/uL (ref 1.7–7.7)
Neutrophils Relative %: 72 %
Platelet Count: 318 10*3/uL (ref 150–400)
RBC: 5.07 MIL/uL (ref 4.22–5.81)
RDW: 15.9 % — ABNORMAL HIGH (ref 11.5–15.5)
WBC Count: 6.5 10*3/uL (ref 4.0–10.5)
nRBC: 0 % (ref 0.0–0.2)

## 2023-05-03 NOTE — Patient Instructions (Signed)
Please take your zofran (ondansetron) as directed

## 2023-05-03 NOTE — Progress Notes (Signed)
No iv fluids needed

## 2023-05-03 NOTE — Progress Notes (Signed)
[facility] at [facility] A Department of the [place]. [facility] [address], [address] [place], [place] [address] [phone] (phone) [phone] (fax)  Patient Care Team: Etheleen Nicks, NP as PCP - General Vernie Murders, MD (Otolaryngology) Katrinka Blazing Jethro Poling, NP (Inactive) as Nurse Practitioner (Hospice and Palliative Medicine) Rosetta Posner, MD as Referring Physician (Geriatric Medicine) Alinda Dooms, NP as Nurse Practitioner (Nurse Practitioner) Dorcas Carrow, RN as Registered Nurse Earna Coder, MD as Consulting Physician (Oncology)   Name of the patient: Kenneth Franklin  [id number]  Jun 21, 1946   Date of visit: 05/03/23  Diagnosis- Nasopharyngeal carcinoma   Chief complaint/ Reason for visit- Consideration of IVF  Heme/Onc history:  Oncology History  Nasopharyngeal cancer (HCC)  08/18/2020 Initial Diagnosis   Nasopharyngeal carcinoma (HCC)   08/18/2020 Cancer Staging   Staging form: Pharynx - Nasopharynx, AJCC 8th Edition - Clinical stage from 08/18/2020: Stage III (cT3, cN0, cM0) - Signed by Rosey Bath, MD on 02/17/2021 Histopathologic type: Squamous cell carcinoma, keratinizing, NOS Stage prefix: Initial diagnosis ECOG performance status: Grade 1 Stage used in treatment planning: Yes National guidelines used in treatment planning: Yes Type of national guideline used in treatment planning: NCCN   08/24/2020 - 10/20/2020 Chemotherapy         12/31/2020 -  Chemotherapy    Patient is on Treatment Plan: HEAD/NECK NASOPHARYNGEAL ADJUVANT CISPLATIN D1 + 5FU IVCI D1-5 Q28D        Recent history from his 04/20/2023 [facility] visit: "Patient is 77 year old male currently receiving salvage radiation for nasopharyngeal carcinoma who [facility] for concerns of abdominal pain, nausea, and acid reflux. Symptoms have been ongoing for years but worse in the early morning after he is  laying down. He has increased mucous in early morning but symptoms clear once he's upright and moving about. He denies weight loss. Says he feels at baseline. His step daughter Inetta Fermo assists with care. He says he cancelled radiation past 2 days due to feeling poorly but symptoms have now resolved and he feels at baseline. Denies any neurologic complaints. Denies recent fevers or illnesses. Denies any easy bleeding or bruising. Reports good appetite and denies weight loss. Denies chest pain. Denies any nausea, vomiting, constipation, or diarrhea. Denies urinary complaints. Patient offers no further specific complaints today." IT was recommended that he stay well hydrated with water, use zofran PRN, continue protonix and elevate his torso while sleeping to help with his nausea/GERD/congestion all of which were felt to be related to his XRT.   Interval History: Patient is here for follow up for the above as well as consideration of IVF.  He is with a friend.   He reports that he is doing well overall. He thinks he has made good progress since he was last seen. He is eating/drinking better than he was although he does admit that he could drink more water and less tea. He states that he is working on eating less junk and more real food. He is trying to drink protein shakes and ensures. He has not been using his zofran as he did not remember to take this. He will restart it and see if it helps increase his oral intake.   Overall his fatigue, GERD, congestion is all improved. Still has baseline generalized weakness.   Review of systems- Review of Systems  Constitutional:  Negative for chills, fever, malaise/fatigue and weight loss.  HENT:  Negative for congestion, hearing loss,  nosebleeds, sore throat and tinnitus.   Eyes:  Negative for blurred vision and double vision.  Respiratory:  Negative for cough, hemoptysis, shortness of breath and wheezing.   Cardiovascular:  Negative for chest pain, palpitations and  leg swelling.  Gastrointestinal:  Negative for abdominal pain, blood in stool, constipation, diarrhea, heartburn, melena, nausea and vomiting.  Genitourinary:  Negative for dysuria and urgency.  Musculoskeletal:  Negative for back pain, falls, joint pain and myalgias.  Skin:  Negative for itching and rash.  Neurological:  Positive for weakness (at baseline). Negative for dizziness, tingling, sensory change, loss of consciousness and headaches.  Endo/Heme/Allergies:  Negative for environmental allergies. Does not bruise/bleed easily.  Psychiatric/Behavioral:  Positive for memory loss. Negative for depression. The patient is not nervous/anxious and does not have insomnia.       Allergies  Allergen Reactions   Propoxyphene     Unknown reaction    Past Medical History:  Diagnosis Date   Acute ischemic stroke (HCC) 2017   Alcohol abuse    Anxiety    Cancer (HCC)    Depression    Diabetes mellitus without complication (HCC)    Dyspnea    pcp knows and ordered rescue inhaler   Hyperlipidemia    Hypertension    Skin cancer    Squamous Cell Carcinoma In Situ    Past Surgical History:  Procedure Laterality Date   CATARACT EXTRACTION Bilateral    COLONOSCOPY     ESOPHAGOGASTRODUODENOSCOPY (EGD) WITH PROPOFOL N/A 09/26/2022   Procedure: ESOPHAGOGASTRODUODENOSCOPY (EGD) WITH PROPOFOL;  Surgeon: Regis Bill, MD;  Location: [facility] ENDOSCOPY;  Service: Endoscopy;  Laterality: N/A;   NASOPHARYNGOSCOPY N/A 08/12/2020   Procedure: ENDOSCOPIC NASOPHARYNGOSCOPY WITH BIOPSY;  Surgeon: Vernie Murders, MD;  Location: [facility] ORS;  Service: ENT;  Laterality: N/A;   PORTA CATH INSERTION N/A 08/24/2020   Procedure: PORTA CATH INSERTION;  Surgeon: Annice Needy, MD;  Location: [facility] INVASIVE CV LAB;  Service: Cardiovascular;  Laterality: N/A;   PORTA CATH REMOVAL N/A 07/22/2021   Procedure: PORTA CATH REMOVAL;  Surgeon: Annice Needy, MD;  Location: [facility] INVASIVE CV LAB;  Service: Cardiovascular;   Laterality: N/A;    Social History   Socioeconomic History   Marital status: Widowed    Spouse name: Not on file   Number of children: Not on file   Years of education: Not on file   Highest education level: Not on file  Occupational History   Not on file  Tobacco Use   Smoking status: Some Days    Packs/day: 1.00    Years: 40.00    Additional pack years: 0.00    Total pack years: 40.00    Types: Cigars, Cigarettes    Last attempt to quit: 08/12/2019    Years since quitting: 3.7   Smokeless tobacco: Never  Vaping Use   Vaping Use: Never used  Substance and Sexual Activity   Alcohol use: Not Currently    Comment: H/O ETOH ABUSE BUT DENIES DRINKING DURING 08-11-20 INTERVIEW   Drug use: No   Sexual activity: Never  Other Topics Concern   Not on file  Social History Narrative   Not on file   Social Determinants of Health   Financial Resource Strain: Not on file  Food Insecurity: Not on file  Transportation Needs: Not on file  Physical Activity: Not on file  Stress: Not on file  Social Connections: Not on file  Intimate Partner Violence: Not on file    Family History  Problem Relation Age of Onset   Diabetes Brother      Current Outpatient Medications:    acetaminophen (TYLENOL) 325 MG tablet, Take 975 mg by mouth every 6 (six) hours as needed., Disp: , Rfl:    ADVAIR HFA 115-21 MCG/ACT inhaler, Inhale 1 puff into the lungs 2 (two) times daily., Disp: , Rfl:    albuterol (PROVENTIL) (2.5 MG/3ML) 0.083% nebulizer solution, , Disp: , Rfl:    Albuterol Sulfate, sensor, 108 (90 Base) MCG/ACT AEPB, Inhale 1-2 puffs into the lungs every 6 (six) hours as needed (Wheezing or Shortness of breath)., Disp: , Rfl:    aspirin EC 81 MG tablet, Take 81 mg by mouth daily. Swallow whole., Disp: , Rfl:    atorvastatin (LIPITOR) 80 MG tablet, Take 1 tablet (80 mg total) by mouth daily., Disp: , Rfl:    Cholecalciferol (QC VITAMIN D3) 50 MCG (2000 UT) CAPS, Take 2,000 Units by mouth  daily., Disp: , Rfl:    clopidogrel (PLAVIX) 75 MG tablet, Take 75 mg by mouth every morning., Disp: , Rfl:    clotrimazole (MYCELEX) 10 MG troche, , Disp: , Rfl:    cyproheptadine (PERIACTIN) 4 MG tablet, Take 1 tablet by mouth daily., Disp: , Rfl:    DULoxetine (CYMBALTA) 30 MG capsule, Take 30 mg by mouth daily., Disp: , Rfl:    famotidine (PEPCID) 40 MG tablet, Take 40 mg by mouth daily., Disp: , Rfl:    feeding supplement (ENSURE ENLIVE / ENSURE PLUS) LIQD, Take 237 mLs by mouth 3 (three) times daily between meals., Disp: 237 mL, Rfl: 12   folic acid (FOLVITE) 1 MG tablet, Take 1 mg by mouth daily., Disp: , Rfl:    glipiZIDE (GLUCOTROL) 5 MG tablet, Take 1 tablet by mouth daily., Disp: , Rfl:    linaclotide (LINZESS) 72 MCG capsule, Hold while in nursing facility., Disp: , Rfl:    Multiple Vitamin (MULTIVITAMIN WITH MINERALS) TABS tablet, Take 1 tablet by mouth daily., Disp: , Rfl:    nicotine (NICODERM CQ - DOSED IN MG/24 HOURS) 14 mg/24hr patch, Place 1 patch (14 mg total) onto the skin daily., Disp: 28 patch, Rfl: 0   ondansetron (ZOFRAN-ODT) 4 MG disintegrating tablet, Take 8 mg by mouth every 8 (eight) hours as needed., Disp: , Rfl:    oxyCODONE (OXY IR/ROXICODONE) 5 MG immediate release tablet, Take 1 tablet (5 mg total) by mouth 2 (two) times daily as needed. Home med., Disp: 20 tablet, Rfl: 0   pantoprazole (PROTONIX) 40 MG tablet, Take 40 mg by mouth daily., Disp: , Rfl:    pilocarpine (SALAGEN) 5 MG tablet, Take 5 mg by mouth 3 (three) times daily., Disp: , Rfl:    polyethylene glycol powder (GLYCOLAX/MIRALAX) 17 GM/SCOOP powder, Take 17 g by mouth in the morning and at bedtime., Disp: , Rfl: 0   polyvinyl alcohol (LIQUIFILM TEARS) 1.4 % ophthalmic solution, Apply to eye., Disp: , Rfl:    prochlorperazine (COMPAZINE) 10 MG tablet, Take 1 tablet (10 mg total) by mouth every 6 (six) hours as needed (Nausea or vomiting)., Disp: 60 tablet, Rfl: 1   promethazine (PHENERGAN) 12.5 MG  tablet, Take by mouth., Disp: , Rfl:    sodium chloride (OCEAN) 0.65 % nasal spray, Place into the nose., Disp: , Rfl:    SODIUM FLUORIDE 5000 PPM 1.1 % GEL dental gel, Take by mouth 3 (three) times daily., Disp: , Rfl:    sulfamethoxazole-trimethoprim (BACTRIM DS) 800-160 MG tablet, Take 1 tablet by mouth  2 (two) times daily., Disp: , Rfl:  No current facility-administered medications for this visit.  Facility-Administered Medications Ordered in Other Visits:    sodium chloride flush (NS) 0.9 % injection 10 mL, 10 mL, Intravenous, PRN, Merlene Pulling, Melissa C, MD, 10 mL at 12/03/20 1138  Physical exam:  Vitals:   05/03/23 0851  BP: 128/82  Pulse: 91  Temp: (!) 96.1 F (35.6 C)  TempSrc: Tympanic  SpO2: 97%  Weight: 130 lb (59 kg)   Wt Readings from Last 3 Encounters:  05/03/23 130 lb (59 kg)  04/20/23 126 lb (57.2 kg)  04/18/23 127 lb (57.6 kg)    Physical Exam Vitals and nursing note reviewed.  Constitutional:      General: He is not in acute distress.    Comments: Frail appearing. Thin build. In wheelchair. 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     Mouth/Throat:     Mouth: Mucous membranes are moist.     Pharynx: No oropharyngeal exudate.  Eyes:     General: No scleral icterus. Cardiovascular:     Rate and Rhythm: Normal rate and regular rhythm.  Pulmonary:     Effort: No respiratory distress.  Musculoskeletal:        General: No deformity.  Skin:    General: Skin is warm and dry.     Coloration: Skin is not pale.  Neurological:     Mental Status: He is alert. Mental status is at baseline.  Psychiatric:        Mood and Affect: Mood normal.        Behavior: Behavior normal.         Latest Ref Rng & Units 05/03/2023    8:38 AM  CMP  Glucose 70 - 99 mg/dL 782   BUN 8 - 23 mg/dL 13   Creatinine 9.56 - 1.24 mg/dL 2.13   Sodium 086 - 578 mmol/L 138   Potassium  3.5 - 5.1 mmol/L 4.6   Chloride 98 - 111 mmol/L 98   CO2 22 - 32 mmol/L 29   Calcium 8.9 - 10.3 mg/dL 9.6   Total Protein 6.5 - 8.1 g/dL 7.9   Total Bilirubin 0.3 - 1.2 mg/dL 0.3   Alkaline Phos 38 - 126 U/L 127   AST 15 - 41 U/L 17   ALT 0 - 44 U/L 12       Latest Ref Rng & Units 05/03/2023    8:38 AM  CBC  WBC 4.0 - 10.5 K/uL 6.5   Hemoglobin 13.0 - 17.0 g/dL 46.9   Hematocrit 62.9 - 52.0 % 42.7   Platelets 150 - 400 K/uL 318     Assessment and plan- Patient is a 77 y.o. male diagnosed with nasopharyngeal carcinoma, currently undergoing salvage RT until 6/28 who preseents for follow up for:   Nausea- I agree that this is/was likely secondary to his radiation treatment & acid reflux. Happy to hear that it has improved some with protonix/torso elevation efforts. I have encouraged him to use his zofran to help with any remaining nausea. Goal is to get him eating/drinking better so that he can get stronger. Given his frail status I am going to link him back up with nutrition.  Congestion- Resolved. Thought to be secondary to XRT.  Frail status- Improved since his last visit with control of his GERD/nausea from  previous efforts. Reviewed labs from today which appear stable. Encouraged continuation of protein shakes and hydration with water. Referring him to nutrition given his frail status and family requests. Nasopharyngeal cancer- Chronic. He will continue his XRT at this time with    completion target date of 05/26/2023.   Disposition: No IVF needed today Referral to Nutrition placed No new appointments needed at this time-[place]  Visit Diagnosis 1. Nasopharyngeal cancer (HCC)   2. GERD without esophagitis   3. Nausea without vomiting   4. Frail elderly     Patient expressed understanding and was in agreement with this plan. He also understands that He can call clinic at any time with any questions, concerns, or complaints.   Thank you for allowing me to participate in the care of  this very pleasant patient.   Rushie Chestnut PA-C [facility] [phone]

## 2023-05-04 ENCOUNTER — Ambulatory Visit: Payer: Medicare Other

## 2023-05-05 ENCOUNTER — Other Ambulatory Visit: Payer: Self-pay

## 2023-05-05 ENCOUNTER — Ambulatory Visit
Admission: RE | Admit: 2023-05-05 | Discharge: 2023-05-05 | Disposition: A | Discharge status: Home / Self Care | Payer: Medicare Other | Source: Ambulatory Visit | Attending: Radiation Oncology | Admitting: Radiation Oncology

## 2023-05-05 DIAGNOSIS — C119 Malignant neoplasm of nasopharynx, unspecified: Secondary | ICD-10-CM | POA: Diagnosis not present

## 2023-05-05 LAB — RAD ONC ARIA SESSION SUMMARY
Course Elapsed Days: 22
Plan Fractions Treated to Date: 11
Plan Prescribed Dose Per Fraction: 2 Gy
Plan Total Fractions Prescribed: 30
Plan Total Prescribed Dose: 60 Gy
Reference Point Dosage Given to Date: 22 Gy
Reference Point Session Dosage Given: 2 Gy
Session Number: 11

## 2023-05-08 ENCOUNTER — Other Ambulatory Visit: Payer: Self-pay

## 2023-05-08 ENCOUNTER — Ambulatory Visit: Payer: Medicare Other

## 2023-05-09 ENCOUNTER — Telehealth: Payer: Self-pay | Admitting: *Deleted

## 2023-05-09 ENCOUNTER — Other Ambulatory Visit: Payer: Self-pay

## 2023-05-09 ENCOUNTER — Inpatient Hospital Stay: Payer: Medicare Other

## 2023-05-09 ENCOUNTER — Ambulatory Visit
Admission: RE | Admit: 2023-05-09 | Discharge: 2023-05-09 | Disposition: A | Discharge status: Home / Self Care | Payer: Medicare Other | Source: Ambulatory Visit | Attending: Radiation Oncology | Admitting: Radiation Oncology

## 2023-05-09 ENCOUNTER — Inpatient Hospital Stay: Payer: Medicare Other | Admitting: Nurse Practitioner

## 2023-05-09 DIAGNOSIS — C119 Malignant neoplasm of nasopharynx, unspecified: Secondary | ICD-10-CM | POA: Diagnosis not present

## 2023-05-09 LAB — RAD ONC ARIA SESSION SUMMARY
Course Elapsed Days: 26
Plan Fractions Treated to Date: 12
Plan Prescribed Dose Per Fraction: 2 Gy
Plan Total Fractions Prescribed: 30
Plan Total Prescribed Dose: 60 Gy
Reference Point Dosage Given to Date: 24 Gy
Reference Point Session Dosage Given: 2 Gy
Session Number: 12

## 2023-05-09 NOTE — Telephone Encounter (Signed)
Per Lawson Fiscal, RN in rad onc. Pt/family decline apts in [facility]-fluids today. "Does not want to stay." Apts cnl per pt/family request

## 2023-05-10 ENCOUNTER — Ambulatory Visit: Payer: Medicare Other

## 2023-05-11 ENCOUNTER — Other Ambulatory Visit: Payer: Self-pay

## 2023-05-11 ENCOUNTER — Ambulatory Visit
Admission: RE | Admit: 2023-05-11 | Discharge: 2023-05-11 | Disposition: A | Discharge status: Home / Self Care | Payer: Medicare Other | Source: Ambulatory Visit | Attending: Radiation Oncology | Admitting: Radiation Oncology

## 2023-05-11 DIAGNOSIS — C119 Malignant neoplasm of nasopharynx, unspecified: Secondary | ICD-10-CM | POA: Diagnosis not present

## 2023-05-11 LAB — RAD ONC ARIA SESSION SUMMARY
Course Elapsed Days: 28
Plan Fractions Treated to Date: 13
Plan Prescribed Dose Per Fraction: 2 Gy
Plan Total Fractions Prescribed: 30
Plan Total Prescribed Dose: 60 Gy
Reference Point Dosage Given to Date: 26 Gy
Reference Point Session Dosage Given: 2 Gy
Session Number: 13

## 2023-05-12 ENCOUNTER — Other Ambulatory Visit: Payer: Self-pay

## 2023-05-12 ENCOUNTER — Ambulatory Visit
Admission: RE | Admit: 2023-05-12 | Discharge: 2023-05-12 | Disposition: A | Discharge status: Home / Self Care | Payer: Medicare Other | Source: Ambulatory Visit | Attending: Radiation Oncology | Admitting: Radiation Oncology

## 2023-05-12 DIAGNOSIS — C119 Malignant neoplasm of nasopharynx, unspecified: Secondary | ICD-10-CM | POA: Diagnosis not present

## 2023-05-12 LAB — RAD ONC ARIA SESSION SUMMARY
Course Elapsed Days: 29
Plan Fractions Treated to Date: 14
Plan Prescribed Dose Per Fraction: 2 Gy
Plan Total Fractions Prescribed: 30
Plan Total Prescribed Dose: 60 Gy
Reference Point Dosage Given to Date: 28 Gy
Reference Point Session Dosage Given: 2 Gy
Session Number: 14

## 2023-05-15 ENCOUNTER — Ambulatory Visit
Admission: RE | Admit: 2023-05-15 | Discharge: 2023-05-15 | Disposition: A | Discharge status: Home / Self Care | Payer: Medicare Other | Source: Ambulatory Visit | Attending: Radiation Oncology | Admitting: Radiation Oncology

## 2023-05-15 ENCOUNTER — Other Ambulatory Visit: Payer: Self-pay

## 2023-05-15 DIAGNOSIS — C119 Malignant neoplasm of nasopharynx, unspecified: Secondary | ICD-10-CM | POA: Diagnosis not present

## 2023-05-15 LAB — RAD ONC ARIA SESSION SUMMARY
Course Elapsed Days: 32
Plan Fractions Treated to Date: 15
Plan Prescribed Dose Per Fraction: 2 Gy
Plan Total Fractions Prescribed: 30
Plan Total Prescribed Dose: 60 Gy
Reference Point Dosage Given to Date: 30 Gy
Reference Point Session Dosage Given: 2 Gy
Session Number: 15

## 2023-05-16 ENCOUNTER — Other Ambulatory Visit: Payer: Self-pay

## 2023-05-16 ENCOUNTER — Inpatient Hospital Stay: Payer: Medicare Other

## 2023-05-16 ENCOUNTER — Ambulatory Visit
Admission: RE | Admit: 2023-05-16 | Discharge: 2023-05-16 | Disposition: A | Discharge status: Home / Self Care | Payer: Medicare Other | Source: Ambulatory Visit | Attending: Radiation Oncology | Admitting: Radiation Oncology

## 2023-05-16 DIAGNOSIS — C119 Malignant neoplasm of nasopharynx, unspecified: Secondary | ICD-10-CM | POA: Diagnosis not present

## 2023-05-16 LAB — RAD ONC ARIA SESSION SUMMARY
Course Elapsed Days: 33
Plan Fractions Treated to Date: 16
Plan Prescribed Dose Per Fraction: 2 Gy
Plan Total Fractions Prescribed: 30
Plan Total Prescribed Dose: 60 Gy
Reference Point Dosage Given to Date: 32 Gy
Reference Point Session Dosage Given: 2 Gy
Session Number: 16

## 2023-05-16 NOTE — Progress Notes (Signed)
Nutrition  Patient did not come to nutrition appointment prior to radiation today.  Will ask scheduling to offer another appointment.  Jb Dulworth B. Freida Busman, RD, LDN Registered Dietitian [phone]

## 2023-05-17 ENCOUNTER — Ambulatory Visit
Admission: RE | Admit: 2023-05-17 | Discharge: 2023-05-17 | Disposition: A | Discharge status: Home / Self Care | Payer: Medicare Other | Source: Ambulatory Visit | Attending: Radiation Oncology | Admitting: Radiation Oncology

## 2023-05-17 ENCOUNTER — Other Ambulatory Visit: Payer: Self-pay

## 2023-05-17 DIAGNOSIS — C119 Malignant neoplasm of nasopharynx, unspecified: Secondary | ICD-10-CM | POA: Diagnosis not present

## 2023-05-17 LAB — RAD ONC ARIA SESSION SUMMARY
Course Elapsed Days: 34
Plan Fractions Treated to Date: 17
Plan Prescribed Dose Per Fraction: 2 Gy
Plan Total Fractions Prescribed: 30
Plan Total Prescribed Dose: 60 Gy
Reference Point Dosage Given to Date: 34 Gy
Reference Point Session Dosage Given: 2 Gy
Session Number: 17

## 2023-05-18 ENCOUNTER — Ambulatory Visit
Admission: RE | Admit: 2023-05-18 | Discharge: 2023-05-18 | Disposition: A | Discharge status: Home / Self Care | Payer: Medicare Other | Source: Ambulatory Visit | Attending: Radiation Oncology | Admitting: Radiation Oncology

## 2023-05-18 ENCOUNTER — Other Ambulatory Visit: Payer: Self-pay

## 2023-05-18 DIAGNOSIS — C119 Malignant neoplasm of nasopharynx, unspecified: Secondary | ICD-10-CM | POA: Diagnosis not present

## 2023-05-18 LAB — RAD ONC ARIA SESSION SUMMARY
Course Elapsed Days: 35
Plan Fractions Treated to Date: 18
Plan Prescribed Dose Per Fraction: 2 Gy
Plan Total Fractions Prescribed: 30
Plan Total Prescribed Dose: 60 Gy
Reference Point Dosage Given to Date: 36 Gy
Reference Point Session Dosage Given: 2 Gy
Session Number: 18

## 2023-05-19 ENCOUNTER — Ambulatory Visit: Payer: Medicare Other

## 2023-05-22 ENCOUNTER — Ambulatory Visit
Admission: RE | Admit: 2023-05-22 | Discharge: 2023-05-22 | Disposition: A | Discharge status: Home / Self Care | Payer: Medicare Other | Source: Ambulatory Visit | Attending: Radiation Oncology | Admitting: Radiation Oncology

## 2023-05-22 ENCOUNTER — Other Ambulatory Visit: Payer: Self-pay

## 2023-05-22 DIAGNOSIS — C119 Malignant neoplasm of nasopharynx, unspecified: Secondary | ICD-10-CM | POA: Diagnosis not present

## 2023-05-22 LAB — RAD ONC ARIA SESSION SUMMARY
Course Elapsed Days: 39
Plan Fractions Treated to Date: 19
Plan Prescribed Dose Per Fraction: 2 Gy
Plan Total Fractions Prescribed: 30
Plan Total Prescribed Dose: 60 Gy
Reference Point Dosage Given to Date: 38 Gy
Reference Point Session Dosage Given: 2 Gy
Session Number: 19

## 2023-05-23 ENCOUNTER — Ambulatory Visit
Admission: RE | Admit: 2023-05-23 | Discharge: 2023-05-23 | Disposition: A | Discharge status: Home / Self Care | Payer: Medicare Other | Source: Ambulatory Visit | Attending: Radiation Oncology | Admitting: Radiation Oncology

## 2023-05-23 ENCOUNTER — Inpatient Hospital Stay: Payer: Medicare Other

## 2023-05-23 ENCOUNTER — Other Ambulatory Visit: Payer: Self-pay

## 2023-05-23 DIAGNOSIS — C119 Malignant neoplasm of nasopharynx, unspecified: Secondary | ICD-10-CM | POA: Diagnosis not present

## 2023-05-23 LAB — RAD ONC ARIA SESSION SUMMARY
Course Elapsed Days: 40
Plan Fractions Treated to Date: 20
Plan Prescribed Dose Per Fraction: 2 Gy
Plan Total Fractions Prescribed: 30
Plan Total Prescribed Dose: 60 Gy
Reference Point Dosage Given to Date: 40 Gy
Reference Point Session Dosage Given: 2 Gy
Session Number: 20

## 2023-05-23 LAB — CBC (CANCER CENTER ONLY)
HCT: 45.9 % (ref 39.0–52.0)
Hemoglobin: 14.3 g/dL (ref 13.0–17.0)
MCH: 26.2 pg (ref 26.0–34.0)
MCHC: 31.2 g/dL (ref 30.0–36.0)
MCV: 84.1 fL (ref 80.0–100.0)
Platelet Count: 334 10*3/uL (ref 150–400)
RBC: 5.46 MIL/uL (ref 4.22–5.81)
RDW: 15.8 % — ABNORMAL HIGH (ref 11.5–15.5)
WBC Count: 7.4 10*3/uL (ref 4.0–10.5)
nRBC: 0 % (ref 0.0–0.2)

## 2023-05-24 ENCOUNTER — Ambulatory Visit: Payer: Medicare Other

## 2023-05-24 ENCOUNTER — Other Ambulatory Visit: Payer: Self-pay

## 2023-05-24 ENCOUNTER — Inpatient Hospital Stay: Payer: Medicare Other

## 2023-05-24 ENCOUNTER — Ambulatory Visit
Admission: RE | Admit: 2023-05-24 | Discharge: 2023-05-24 | Disposition: A | Discharge status: Home / Self Care | Payer: Medicare Other | Source: Ambulatory Visit | Attending: Radiation Oncology | Admitting: Radiation Oncology

## 2023-05-24 DIAGNOSIS — C119 Malignant neoplasm of nasopharynx, unspecified: Secondary | ICD-10-CM | POA: Diagnosis not present

## 2023-05-24 LAB — RAD ONC ARIA SESSION SUMMARY
Course Elapsed Days: 41
Plan Fractions Treated to Date: 21
Plan Prescribed Dose Per Fraction: 2 Gy
Plan Total Fractions Prescribed: 30
Plan Total Prescribed Dose: 60 Gy
Reference Point Dosage Given to Date: 42 Gy
Reference Point Session Dosage Given: 2 Gy
Session Number: 21

## 2023-05-24 NOTE — Progress Notes (Signed)
Nutrition  Patient did not show up for scheduled nutrition appointment (2nd reschedule)  Kenneth Franklin B. Freida Busman, RD, LDN Registered Dietitian [phone]

## 2023-05-25 ENCOUNTER — Other Ambulatory Visit: Payer: Self-pay

## 2023-05-25 ENCOUNTER — Ambulatory Visit: Payer: Medicare Other

## 2023-05-25 ENCOUNTER — Ambulatory Visit
Admission: RE | Admit: 2023-05-25 | Discharge: 2023-05-25 | Disposition: A | Discharge status: Home / Self Care | Payer: Medicare Other | Source: Ambulatory Visit | Attending: Radiation Oncology | Admitting: Radiation Oncology

## 2023-05-25 DIAGNOSIS — C119 Malignant neoplasm of nasopharynx, unspecified: Secondary | ICD-10-CM | POA: Diagnosis not present

## 2023-05-25 LAB — RAD ONC ARIA SESSION SUMMARY
Course Elapsed Days: 42
Plan Fractions Treated to Date: 22
Plan Prescribed Dose Per Fraction: 2 Gy
Plan Total Fractions Prescribed: 30
Plan Total Prescribed Dose: 60 Gy
Reference Point Dosage Given to Date: 44 Gy
Reference Point Session Dosage Given: 2 Gy
Session Number: 22

## 2023-05-26 ENCOUNTER — Other Ambulatory Visit: Payer: Self-pay

## 2023-05-26 ENCOUNTER — Ambulatory Visit: Payer: Medicare Other

## 2023-05-26 ENCOUNTER — Ambulatory Visit
Admission: RE | Admit: 2023-05-26 | Discharge: 2023-05-26 | Disposition: A | Discharge status: Home / Self Care | Payer: Medicare Other | Source: Ambulatory Visit | Attending: Radiation Oncology | Admitting: Radiation Oncology

## 2023-05-26 DIAGNOSIS — C119 Malignant neoplasm of nasopharynx, unspecified: Secondary | ICD-10-CM | POA: Diagnosis not present

## 2023-05-26 LAB — RAD ONC ARIA SESSION SUMMARY
Course Elapsed Days: 43
Plan Fractions Treated to Date: 23
Plan Prescribed Dose Per Fraction: 2 Gy
Plan Total Fractions Prescribed: 30
Plan Total Prescribed Dose: 60 Gy
Reference Point Dosage Given to Date: 46 Gy
Reference Point Session Dosage Given: 2 Gy
Session Number: 23

## 2023-05-29 ENCOUNTER — Other Ambulatory Visit: Payer: Self-pay

## 2023-05-29 ENCOUNTER — Ambulatory Visit
Admission: RE | Admit: 2023-05-29 | Discharge: 2023-05-29 | Disposition: A | Discharge status: Home / Self Care | Payer: Medicare Other | Source: Ambulatory Visit | Attending: Radiation Oncology | Admitting: Radiation Oncology

## 2023-05-29 ENCOUNTER — Ambulatory Visit: Payer: Medicare Other

## 2023-05-29 DIAGNOSIS — E785 Hyperlipidemia, unspecified: Secondary | ICD-10-CM | POA: Insufficient documentation

## 2023-05-29 DIAGNOSIS — Z7902 Long term (current) use of antithrombotics/antiplatelets: Secondary | ICD-10-CM | POA: Diagnosis not present

## 2023-05-29 DIAGNOSIS — I10 Essential (primary) hypertension: Secondary | ICD-10-CM | POA: Insufficient documentation

## 2023-05-29 DIAGNOSIS — Z7984 Long term (current) use of oral hypoglycemic drugs: Secondary | ICD-10-CM | POA: Diagnosis not present

## 2023-05-29 DIAGNOSIS — Z7951 Long term (current) use of inhaled steroids: Secondary | ICD-10-CM | POA: Diagnosis not present

## 2023-05-29 DIAGNOSIS — Z85828 Personal history of other malignant neoplasm of skin: Secondary | ICD-10-CM | POA: Diagnosis not present

## 2023-05-29 DIAGNOSIS — Z923 Personal history of irradiation: Secondary | ICD-10-CM | POA: Insufficient documentation

## 2023-05-29 DIAGNOSIS — E119 Type 2 diabetes mellitus without complications: Secondary | ICD-10-CM | POA: Diagnosis not present

## 2023-05-29 DIAGNOSIS — C119 Malignant neoplasm of nasopharynx, unspecified: Secondary | ICD-10-CM | POA: Insufficient documentation

## 2023-05-29 DIAGNOSIS — Z8673 Personal history of transient ischemic attack (TIA), and cerebral infarction without residual deficits: Secondary | ICD-10-CM | POA: Diagnosis not present

## 2023-05-29 DIAGNOSIS — Z7982 Long term (current) use of aspirin: Secondary | ICD-10-CM | POA: Insufficient documentation

## 2023-05-29 DIAGNOSIS — F17290 Nicotine dependence, other tobacco product, uncomplicated: Secondary | ICD-10-CM | POA: Diagnosis not present

## 2023-05-29 DIAGNOSIS — Z79899 Other long term (current) drug therapy: Secondary | ICD-10-CM | POA: Diagnosis not present

## 2023-05-29 LAB — RAD ONC ARIA SESSION SUMMARY
Course Elapsed Days: 46
Plan Fractions Treated to Date: 24
Plan Prescribed Dose Per Fraction: 2 Gy
Plan Total Fractions Prescribed: 30
Plan Total Prescribed Dose: 60 Gy
Reference Point Dosage Given to Date: 48 Gy
Reference Point Session Dosage Given: 2 Gy
Session Number: 24

## 2023-05-30 ENCOUNTER — Ambulatory Visit
Admission: RE | Admit: 2023-05-30 | Discharge: 2023-05-30 | Disposition: A | Discharge status: Home / Self Care | Payer: Medicare Other | Source: Ambulatory Visit | Attending: Radiation Oncology | Admitting: Radiation Oncology

## 2023-05-30 ENCOUNTER — Inpatient Hospital Stay: Payer: Medicare Other | Attending: Internal Medicine

## 2023-05-30 ENCOUNTER — Ambulatory Visit: Payer: Medicare Other

## 2023-05-30 ENCOUNTER — Other Ambulatory Visit: Payer: Self-pay

## 2023-05-30 DIAGNOSIS — C119 Malignant neoplasm of nasopharynx, unspecified: Secondary | ICD-10-CM | POA: Diagnosis not present

## 2023-05-30 LAB — RAD ONC ARIA SESSION SUMMARY
Course Elapsed Days: 47
Plan Fractions Treated to Date: 25
Plan Prescribed Dose Per Fraction: 2 Gy
Plan Total Fractions Prescribed: 30
Plan Total Prescribed Dose: 60 Gy
Reference Point Dosage Given to Date: 50 Gy
Reference Point Session Dosage Given: 2 Gy
Session Number: 25

## 2023-05-31 ENCOUNTER — Ambulatory Visit: Payer: Medicare Other

## 2023-05-31 ENCOUNTER — Other Ambulatory Visit: Payer: Self-pay

## 2023-05-31 ENCOUNTER — Ambulatory Visit
Admission: RE | Admit: 2023-05-31 | Discharge: 2023-05-31 | Disposition: A | Discharge status: Home / Self Care | Payer: Medicare Other | Source: Ambulatory Visit | Attending: Radiation Oncology | Admitting: Radiation Oncology

## 2023-05-31 DIAGNOSIS — C119 Malignant neoplasm of nasopharynx, unspecified: Secondary | ICD-10-CM | POA: Diagnosis not present

## 2023-05-31 LAB — RAD ONC ARIA SESSION SUMMARY
Course Elapsed Days: 48
Plan Fractions Treated to Date: 26
Plan Prescribed Dose Per Fraction: 2 Gy
Plan Total Fractions Prescribed: 30
Plan Total Prescribed Dose: 60 Gy
Reference Point Dosage Given to Date: 52 Gy
Reference Point Session Dosage Given: 2 Gy
Session Number: 26

## 2023-06-02 ENCOUNTER — Ambulatory Visit: Payer: Medicare Other

## 2023-06-02 ENCOUNTER — Other Ambulatory Visit: Payer: Self-pay

## 2023-06-02 ENCOUNTER — Ambulatory Visit
Admission: RE | Admit: 2023-06-02 | Discharge: 2023-06-02 | Disposition: A | Discharge status: Home / Self Care | Payer: Medicare Other | Source: Ambulatory Visit | Attending: Radiation Oncology | Admitting: Radiation Oncology

## 2023-06-02 DIAGNOSIS — C119 Malignant neoplasm of nasopharynx, unspecified: Secondary | ICD-10-CM | POA: Diagnosis not present

## 2023-06-02 LAB — RAD ONC ARIA SESSION SUMMARY
Course Elapsed Days: 50
Plan Fractions Treated to Date: 27
Plan Prescribed Dose Per Fraction: 2 Gy
Plan Total Fractions Prescribed: 30
Plan Total Prescribed Dose: 60 Gy
Reference Point Dosage Given to Date: 54 Gy
Reference Point Session Dosage Given: 2 Gy
Session Number: 27

## 2023-06-03 ENCOUNTER — Other Ambulatory Visit: Payer: Self-pay

## 2023-06-05 ENCOUNTER — Other Ambulatory Visit: Payer: Self-pay

## 2023-06-05 ENCOUNTER — Ambulatory Visit
Admission: RE | Admit: 2023-06-05 | Discharge: 2023-06-05 | Disposition: A | Discharge status: Home / Self Care | Payer: Medicare Other | Source: Ambulatory Visit | Attending: Radiation Oncology | Admitting: Radiation Oncology

## 2023-06-05 ENCOUNTER — Ambulatory Visit: Payer: Medicare Other

## 2023-06-05 DIAGNOSIS — C119 Malignant neoplasm of nasopharynx, unspecified: Secondary | ICD-10-CM | POA: Diagnosis not present

## 2023-06-05 LAB — RAD ONC ARIA SESSION SUMMARY
Course Elapsed Days: 53
Plan Fractions Treated to Date: 28
Plan Prescribed Dose Per Fraction: 2 Gy
Plan Total Fractions Prescribed: 30
Plan Total Prescribed Dose: 60 Gy
Reference Point Dosage Given to Date: 56 Gy
Reference Point Session Dosage Given: 2 Gy
Session Number: 28

## 2023-06-06 ENCOUNTER — Ambulatory Visit
Admission: RE | Admit: 2023-06-06 | Discharge: 2023-06-06 | Disposition: A | Discharge status: Home / Self Care | Payer: Medicare Other | Source: Ambulatory Visit | Attending: Radiation Oncology | Admitting: Radiation Oncology

## 2023-06-06 ENCOUNTER — Other Ambulatory Visit: Payer: Self-pay

## 2023-06-06 ENCOUNTER — Ambulatory Visit: Payer: Medicare Other

## 2023-06-06 DIAGNOSIS — C119 Malignant neoplasm of nasopharynx, unspecified: Secondary | ICD-10-CM | POA: Diagnosis not present

## 2023-06-06 LAB — RAD ONC ARIA SESSION SUMMARY
Course Elapsed Days: 54
Plan Fractions Treated to Date: 29
Plan Prescribed Dose Per Fraction: 2 Gy
Plan Total Fractions Prescribed: 30
Plan Total Prescribed Dose: 60 Gy
Reference Point Dosage Given to Date: 58 Gy
Reference Point Session Dosage Given: 2 Gy
Session Number: 29

## 2023-06-07 ENCOUNTER — Other Ambulatory Visit: Payer: Self-pay

## 2023-06-07 ENCOUNTER — Ambulatory Visit
Admission: RE | Admit: 2023-06-07 | Discharge: 2023-06-07 | Disposition: A | Discharge status: Home / Self Care | Payer: Medicare Other | Source: Ambulatory Visit | Attending: Radiation Oncology | Admitting: Radiation Oncology

## 2023-06-07 DIAGNOSIS — C119 Malignant neoplasm of nasopharynx, unspecified: Secondary | ICD-10-CM | POA: Diagnosis not present

## 2023-06-07 LAB — RAD ONC ARIA SESSION SUMMARY
Course Elapsed Days: 55
Plan Fractions Treated to Date: 30
Plan Prescribed Dose Per Fraction: 2 Gy
Plan Total Fractions Prescribed: 30
Plan Total Prescribed Dose: 60 Gy
Reference Point Dosage Given to Date: 60 Gy
Reference Point Session Dosage Given: 2 Gy
Session Number: 30

## 2023-06-19 ENCOUNTER — Inpatient Hospital Stay: Payer: Medicare Other

## 2023-06-19 ENCOUNTER — Inpatient Hospital Stay (HOSPITAL_BASED_OUTPATIENT_CLINIC_OR_DEPARTMENT_OTHER): Payer: Medicare Other | Admitting: Internal Medicine

## 2023-06-19 ENCOUNTER — Encounter: Payer: Self-pay | Admitting: Internal Medicine

## 2023-06-19 VITALS — BP 142/83 | HR 89 | Temp 97.8°F | Resp 19 | Wt 126.0 lb

## 2023-06-19 DIAGNOSIS — Z923 Personal history of irradiation: Secondary | ICD-10-CM | POA: Insufficient documentation

## 2023-06-19 DIAGNOSIS — K5900 Constipation, unspecified: Secondary | ICD-10-CM | POA: Insufficient documentation

## 2023-06-19 DIAGNOSIS — C119 Malignant neoplasm of nasopharynx, unspecified: Secondary | ICD-10-CM

## 2023-06-19 DIAGNOSIS — Z9221 Personal history of antineoplastic chemotherapy: Secondary | ICD-10-CM | POA: Diagnosis not present

## 2023-06-19 DIAGNOSIS — Z79899 Other long term (current) drug therapy: Secondary | ICD-10-CM | POA: Insufficient documentation

## 2023-06-19 LAB — CBC WITH DIFFERENTIAL (CANCER CENTER ONLY)
Abs Immature Granulocytes: 0.01 10*3/uL (ref 0.00–0.07)
Basophils Absolute: 0.1 10*3/uL (ref 0.0–0.1)
Basophils Relative: 1 %
Eosinophils Absolute: 0.2 10*3/uL (ref 0.0–0.5)
Eosinophils Relative: 2 %
HCT: 43 % (ref 39.0–52.0)
Hemoglobin: 13.4 g/dL (ref 13.0–17.0)
Immature Granulocytes: 0 %
Lymphocytes Relative: 16 %
Lymphs Abs: 1.3 10*3/uL (ref 0.7–4.0)
MCH: 26.9 pg (ref 26.0–34.0)
MCHC: 31.2 g/dL (ref 30.0–36.0)
MCV: 86.3 fL (ref 80.0–100.0)
Monocytes Absolute: 0.6 10*3/uL (ref 0.1–1.0)
Monocytes Relative: 8 %
Neutro Abs: 5.8 10*3/uL (ref 1.7–7.7)
Neutrophils Relative %: 73 %
Platelet Count: 316 10*3/uL (ref 150–400)
RBC: 4.98 MIL/uL (ref 4.22–5.81)
RDW: 15.4 % (ref 11.5–15.5)
WBC Count: 7.9 10*3/uL (ref 4.0–10.5)
nRBC: 0 % (ref 0.0–0.2)

## 2023-06-19 LAB — CMP (CANCER CENTER ONLY)
ALT: 13 U/L (ref 0–44)
AST: 15 U/L (ref 15–41)
Albumin: 3.9 g/dL (ref 3.5–5.0)
Alkaline Phosphatase: 129 U/L — ABNORMAL HIGH (ref 38–126)
Anion gap: 10 (ref 5–15)
BUN: 12 mg/dL (ref 8–23)
CO2: 27 mmol/L (ref 22–32)
Calcium: 9.1 mg/dL (ref 8.9–10.3)
Chloride: 98 mmol/L (ref 98–111)
Creatinine: 0.83 mg/dL (ref 0.61–1.24)
GFR, Estimated: >60 mL/min (ref >60)
Glucose, Bld: 184 mg/dL — ABNORMAL HIGH (ref 70–99)
Potassium: 4.5 mmol/L (ref 3.5–5.1)
Sodium: 135 mmol/L (ref 135–145)
Total Bilirubin: 0.3 mg/dL (ref 0.3–1.2)
Total Protein: 7.7 g/dL (ref 6.5–8.1)

## 2023-06-19 MED ORDER — ONDANSETRON 4 MG PO TBDP: 8.0000 mg | ORAL_TABLET | Freq: Three times a day (TID) | ORAL | 1 refills | Status: DC | PRN

## 2023-06-19 NOTE — Progress Notes (Signed)
[facility] OFFICE PROGRESS NOTE  [place] Care Team: Etheleen Nicks, NP as PCP - General Vernie Murders, MD (Otolaryngology) Katrinka Blazing Jethro Poling, NP (Inactive) as Nurse Practitioner (Hospice and Palliative Medicine) Rosetta Posner, MD as Referring Physician (Geriatric Medicine) Alinda Dooms, NP as Nurse Practitioner (Nurse Practitioner) Dorcas Carrow, RN as Registered Nurse Earna Coder, MD as Consulting Physician (Oncology)  SUMMARY OF ONCOLOGIC HISTORY: Oncology History  Nasopharyngeal cancer [facility])  08/18/2020 Initial Diagnosis   Nasopharyngeal carcinoma (HCC)   08/18/2020 Cancer Staging   Staging form: Pharynx - Nasopharynx, AJCC 8th Edition - Clinical stage from 08/18/2020: Stage III (cT3, cN0, cM0) - Signed by Rosey Bath, MD on 02/17/2021 Histopathologic type: Squamous cell carcinoma, keratinizing, NOS Stage prefix: Initial diagnosis ECOG performance status: Grade 1 Stage used in treatment planning: Yes National guidelines used in treatment planning: Yes Type of national guideline used in treatment planning: NCCN   08/24/2020 - 10/20/2020 Chemotherapy         12/31/2020 -  Chemotherapy   [facility] is on Treatment Plan : HEAD/NECK NASOPHARYNGEAL ADJUVANT Cisplatin D1 + 5FU IVCI D1-5 q28d      INTERVAL HISTORY: in wheel chair; alone.  77 year old male Franklin with a history of recent stroke/debility weight loss ; and recurrence of nasopharyngeal cancer noted on recent April 2024 PET scan -currently status post palliative radiation is here for follow-up.  Franklin continues to have difficulty getting from his right ear.  Franklin finished radiation approximately 2 weeks ago.  Franklin states he is waking up everyday nausea-with no vomiting.  Improved on antiemetics.  Chronic mild constipation.  Not any worse.  Has not lost any weight.  Franklin has not needed IV fluids during his radiation treatments.  Review of Systems   Constitutional:  Positive for malaise/fatigue and weight loss. Negative for chills, diaphoresis and fever.  HENT:  Positive for hearing loss. Negative for nosebleeds and sore throat.   Eyes:  Negative for double vision.  Respiratory:  Negative for cough, hemoptysis, sputum production, shortness of breath and wheezing.   Cardiovascular:  Negative for chest pain, palpitations, orthopnea and leg swelling.  Gastrointestinal:  Positive for nausea. Negative for abdominal pain, blood in stool, constipation, diarrhea, heartburn, melena and vomiting.  Genitourinary:  Negative for dysuria, frequency and urgency.  Musculoskeletal:  Positive for back pain and joint pain.  Skin: Negative.  Negative for itching and rash.  Neurological:  Negative for dizziness, tingling, focal weakness, weakness and headaches.  Endo/Heme/Allergies:  Does not bruise/bleed easily.  Psychiatric/Behavioral:  Negative for depression. The [facility] is not nervous/anxious and does not have insomnia.      ALLERGIES:  is allergic to propoxyphene.  MEDICATIONS:  Current Outpatient Medications  Medication Sig Dispense Refill   acetaminophen (TYLENOL) 325 MG tablet Take 975 mg by mouth every 6 (six) hours as needed.     ADVAIR HFA 115-21 MCG/ACT inhaler Inhale 1 puff into the lungs 2 (two) times daily.     albuterol (PROVENTIL) (2.5 MG/3ML) 0.083% nebulizer solution      Albuterol Sulfate, sensor, 108 (90 Base) MCG/ACT AEPB Inhale 1-2 puffs into the lungs every 6 (six) hours as needed (Wheezing or Shortness of breath).     aspirin EC 81 MG tablet Take 81 mg by mouth daily. Swallow whole.     atorvastatin (LIPITOR) 80 MG tablet Take 1 tablet (80 mg total) by mouth daily.     Cholecalciferol (QC VITAMIN D3) 50 MCG (2000 UT) CAPS Take 2,000 Units by  mouth daily.     clopidogrel (PLAVIX) 75 MG tablet Take 75 mg by mouth every morning.     clotrimazole (MYCELEX) 10 MG troche      cyproheptadine (PERIACTIN) 4 MG tablet Take 1 tablet by  mouth daily.     DULoxetine (CYMBALTA) 30 MG capsule Take 30 mg by mouth daily.     famotidine (PEPCID) 40 MG tablet Take 40 mg by mouth daily.     feeding supplement (ENSURE ENLIVE / ENSURE PLUS) LIQD Take 237 mLs by mouth 3 (three) times daily between meals. 237 mL 12   folic acid (FOLVITE) 1 MG tablet Take 1 mg by mouth daily.     glipiZIDE (GLUCOTROL) 5 MG tablet Take 1 tablet by mouth daily.     linaclotide (LINZESS) 72 MCG capsule Hold while in nursing facility.     Multiple Vitamin (MULTIVITAMIN WITH MINERALS) TABS tablet Take 1 tablet by mouth daily.     nicotine (NICODERM CQ - DOSED IN MG/24 HOURS) 14 mg/24hr patch Place 1 patch (14 mg total) onto the skin daily. Kenneth patch 0   oxyCODONE (OXY IR/ROXICODONE) 5 MG immediate release tablet Take 1 tablet (5 mg total) by mouth 2 (two) times daily as needed. Home med. 20 tablet 0   pantoprazole (PROTONIX) 40 MG tablet Take 40 mg by mouth daily.     pilocarpine (SALAGEN) 5 MG tablet Take 5 mg by mouth 3 (three) times daily.     polyethylene glycol powder (GLYCOLAX/MIRALAX) 17 GM/SCOOP powder Take 17 g by mouth in the morning and at bedtime.  0   polyvinyl alcohol (LIQUIFILM TEARS) 1.4 % ophthalmic solution Apply to eye.     prochlorperazine (COMPAZINE) 10 MG tablet Take 1 tablet (10 mg total) by mouth every 6 (six) hours as needed (Nausea or vomiting). 60 tablet 1   promethazine (PHENERGAN) 12.5 MG tablet Take by mouth.     sodium chloride (OCEAN) 0.65 % nasal spray Place into the nose.     SODIUM FLUORIDE 5000 PPM 1.1 % GEL dental gel Take by mouth 3 (three) times daily.     sulfamethoxazole-trimethoprim (BACTRIM DS) 800-160 MG tablet Take 1 tablet by mouth 2 (two) times daily.     ondansetron (ZOFRAN-ODT) 4 MG disintegrating tablet Take 2 tablets (8 mg total) by mouth every 8 (eight) hours as needed. 40 tablet 1   No current facility-administered medications for this visit.   Facility-Administered Medications Ordered in Other Visits   Medication Dose Route Frequency Provider Last Rate Last Admin   sodium chloride flush (NS) 0.9 % injection 10 mL  10 mL Intravenous PRN Nelva Nay C, MD   10 mL at 12/03/20 1138    PHYSICAL EXAMINATION:   Vitals:   06/19/23 1049  BP: (!) 142/83  Pulse: 89  Resp: 19  Temp: 97.8 F (36.6 C)  SpO2: 98%     Filed Weights   06/19/23 1049  Weight: 126 lb (57.2 kg)     Physical Exam Vitals and nursing note reviewed.  HENT:     Head: Normocephalic and atraumatic.     Mouth/Throat:     Pharynx: Oropharynx is clear.  Eyes:     Extraocular Movements: Extraocular movements intact.     Pupils: Pupils are equal, round, and reactive to light.  Cardiovascular:     Rate and Rhythm: Normal rate and regular rhythm.  Pulmonary:     Comments: Decreased breath sounds bilaterally.  Abdominal:     Palpations: Abdomen is soft.  Musculoskeletal:        General: Normal range of motion.     Cervical back: Normal range of motion.  Skin:    General: Skin is warm.  Neurological:     General: No focal deficit present.     Mental Status: He is alert and oriented to person, place, and time.  Psychiatric:        Behavior: Behavior normal.        Judgment: Judgment normal.      LABORATORY DATA:  I have reviewed the data as listed    Component Value Date/Time   NA 135 06/19/2023 1037   NA 137 12/15/2015 1056   K 4.5 06/19/2023 1037   CL 98 06/19/2023 1037   CO2 27 06/19/2023 1037   GLUCOSE 184 (H) 06/19/2023 1037   BUN 12 06/19/2023 1037   BUN 8 12/15/2015 1056   CREATININE 0.83 06/19/2023 1037   CREATININE 1.07 06/16/2016 1632   CALCIUM 9.1 06/19/2023 1037   PROT 7.7 06/19/2023 1037   PROT 6.5 12/15/2015 1056   ALBUMIN 3.9 06/19/2023 1037   ALBUMIN 4.2 12/15/2015 1056   AST 15 06/19/2023 1037   ALT 13 06/19/2023 1037   ALKPHOS 129 (H) 06/19/2023 1037   BILITOT 0.3 06/19/2023 1037   GFRNONAA >60 06/19/2023 1037   GFRNONAA 71 06/16/2016 1632   GFRAA >60 08/18/2020  1129   GFRAA 82 06/16/2016 1632    No results found for: "SPEP", "UPEP"  Lab Results  Component Value Date   WBC 7.9 06/19/2023   NEUTROABS 5.8 06/19/2023   HGB 13.4 06/19/2023   HCT 43.0 06/19/2023   MCV 86.3 06/19/2023   PLT 316 06/19/2023      Chemistry      Component Value Date/Time   NA 135 06/19/2023 1037   NA 137 12/15/2015 1056   K 4.5 06/19/2023 1037   CL 98 06/19/2023 1037   CO2 27 06/19/2023 1037   BUN 12 06/19/2023 1037   BUN 8 12/15/2015 1056   CREATININE 0.83 06/19/2023 1037   CREATININE 1.07 06/16/2016 1632      Component Value Date/Time   CALCIUM 9.1 06/19/2023 1037   ALKPHOS 129 (H) 06/19/2023 1037   AST 15 06/19/2023 1037   ALT 13 06/19/2023 1037   BILITOT 0.3 06/19/2023 1037       No results found for: "CA125", "CEA", "PSA", "CA199", "CA2729", "AFP"   RADIOGRAPHIC STUDIES: I have personally reviewed the radiological images as listed and agreed with the findings in the report. No results found.   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 S/p 5 weeks of concurrent cisplatin and radiation [shortened x 3 fractions d/t mucositis].  [facility] declined adjuvant chemotherapy.  Completed 10/24/20. [Dr.Juengel].  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 No findings suspicious for metastatic disease. Currently undergoing salvage RT until 07/10- 2024.  .   # will repeat PET scan in appx 3 months- ordered today. HOLD off any systemic therapy at this time. If progressive disease noted- would recommend Immunotherapy.   # Nausea- sect to RT vs other- continue zofran ODT;/Compazine. Continue- new scipt sent.   # Constipation- continue supportive care with MiraLAX Dulcolax etc.  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  S/p evaluation with Per Neuro-improving. Stable.   # DISPOSITION:  # follow  up in 1st week of OCT 2024- MD: labs- cbc/cmp; PET scan 1 week prior-- Dr.B     Orders Placed This Encounter  Procedures   NM PET Image Restage (PS) Skull Base to Thigh (F-18 FDG)    Standing Status:   Future    Standing Expiration Date:   06/18/2024    Order Specific Question:   If indicated for the ordered procedure, I authorize the administration of a radiopharmaceutical per Radiology protocol    Answer:   Yes    Order Specific Question:   Preferred imaging location?    Answer:   [facility]   CBC with Differential (Cancer Center Only)    Standing Status:   Future    Standing Expiration Date:   06/18/2024   CMP (Cancer Center only)    Standing Status:   Future    Standing Expiration Date:   06/18/2024   All questions were answered. The [facility] knows to call the clinic with any problems, questions or concerns. No barriers to learning was detected.    Earna Coder, MD 06/19/2023 11:40 AM

## 2023-06-19 NOTE — Progress Notes (Signed)
Patient states for about a week now he has lost hearing in his right ear.

## 2023-06-19 NOTE — Assessment & Plan Note (Signed)
#   T3NxMx/stage 3 nasopharyngeal carcinoma- unresectable. S/p 5 weeks of concurrent cisplatin and radiation [shortened x 3 fractions d/t mucositis].  Patient declined adjuvant chemotherapy.  Completed 10/24/20. [Dr.Juengel].  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 No findings suspicious for metastatic disease. Currently undergoing salvage RT until 07/10- 2024.  .   # will repeat PET scan in appx 3 months- ordered today. HOLD off any systemic therapy at this time. If progressive disease noted- would recommend Immunotherapy.   # Nausea- sect to RT vs other- continue zofran ODT;/Compazine. Continue- new scipt sent.   # Constipation- continue supportive care with MiraLAX Dulcolax etc.  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  S/p evaluation with Per Neuro-improving. Stable.   # DISPOSITION:  # follow up in 1st week of OCT 2024- MD: labs- cbc/cmp; PET scan 1 week prior-- Dr.B

## 2023-06-21 ENCOUNTER — Other Ambulatory Visit: Payer: Self-pay

## 2023-07-09 ENCOUNTER — Other Ambulatory Visit: Payer: Self-pay

## 2023-07-17 ENCOUNTER — Ambulatory Visit: Payer: Medicare Other | Admitting: Radiation Oncology

## 2023-07-17 ENCOUNTER — Encounter: Payer: Self-pay | Admitting: Radiation Oncology

## 2023-07-17 ENCOUNTER — Other Ambulatory Visit: Payer: Self-pay | Admitting: *Deleted

## 2023-07-17 ENCOUNTER — Ambulatory Visit
Admission: RE | Admit: 2023-07-17 | Discharge: 2023-07-17 | Disposition: A | Discharge status: Home / Self Care | Payer: Medicare Other | Source: Ambulatory Visit | Attending: Radiation Oncology | Admitting: Radiation Oncology

## 2023-07-17 VITALS — BP 89/71 | HR 105 | Resp 14 | Ht 72.0 in

## 2023-07-17 DIAGNOSIS — C119 Malignant neoplasm of nasopharynx, unspecified: Secondary | ICD-10-CM

## 2023-07-17 NOTE — Progress Notes (Signed)
Radiation Oncology Follow up Note  Name: Kenneth [facility]   Date:   07/17/2023 MRN:  [id number] DOB: [birth year]    This 77 y.o. 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  Seen today in follow-up he is improving he is having no dysphagia or head and neck pain he is still having some hearing loss in the right ear he has been seen by ENT with recommendation for surgery although he has declined that..  COMPLICATIONS OF TREATMENT: none  FOLLOW UP COMPLIANCE: keeps appointments   PHYSICAL EXAM:  BP (!) 89/71   Pulse (!) 105   Resp 14   Ht 6' (1.829 m)   BMI 17.09 kg/m  Oral cavity is clear no oral mucosal lesions are identified.  Neck is clear without evidence of cervical or supraclavicular adenopathy.  Patient is frail wheelchair-bound.  Well-developed well-nourished patient in NAD. HEENT reveals PERLA, EOMI, discs not visualized.  Oral cavity is clear. No oral mucosal lesions are identified. Neck is clear without evidence of cervical or supraclavicular adenopathy. Lungs are clear to A&P. Cardiac examination is essentially unremarkable with regular rate and rhythm without murmur rub or thrill. Abdomen is benign with no organomegaly or masses noted. Motor sensory and DTR levels are equal and symmetric in the upper and lower extremities. Cranial nerves II through XII are grossly intact. Proprioception is intact. No peripheral adenopathy or edema is identified. No motor or sensory levels are noted. Crude visual fields are within normal  range.  RADIOLOGY RESULTS: CT scan of the soft tissue head and neck ordered in 3 months  PLAN: At this time of ordered a CT scan of the head and neck in 3 months and will see him back about a week later for follow-up.  Of asked him to continue follow-up care with ENT.  Patient and family know to call with any concerns.  I would like to take this opportunity to thank you for allowing me to participate in the care of your patient.Carmina Miller, MD

## 2023-07-18 ENCOUNTER — Other Ambulatory Visit: Payer: Self-pay

## 2023-08-15 ENCOUNTER — Telehealth: Payer: Self-pay

## 2023-08-15 NOTE — Telephone Encounter (Signed)
error 

## 2023-08-28 ENCOUNTER — Ambulatory Visit
Admission: RE | Admit: 2023-08-28 | Discharge: 2023-08-28 | Disposition: A | Discharge status: Home / Self Care | Payer: Medicare Other | Source: Ambulatory Visit | Attending: Internal Medicine | Admitting: Internal Medicine

## 2023-08-28 DIAGNOSIS — C119 Malignant neoplasm of nasopharynx, unspecified: Secondary | ICD-10-CM | POA: Insufficient documentation

## 2023-08-28 LAB — GLUCOSE, CAPILLARY: Glucose-Capillary: 100 mg/dL — ABNORMAL HIGH (ref 70–99)

## 2023-08-28 MED ORDER — FLUDEOXYGLUCOSE F - 18 (FDG) INJECTION
6.5000 | Freq: Once | INTRAVENOUS | Status: AC | PRN
  Administered 2023-08-28: 7.01 via INTRAVENOUS

## 2023-09-04 ENCOUNTER — Inpatient Hospital Stay: Payer: Medicare Other | Attending: Internal Medicine

## 2023-09-04 ENCOUNTER — Inpatient Hospital Stay (HOSPITAL_BASED_OUTPATIENT_CLINIC_OR_DEPARTMENT_OTHER): Payer: Medicare Other | Admitting: Internal Medicine

## 2023-09-04 ENCOUNTER — Encounter: Payer: Self-pay | Admitting: Internal Medicine

## 2023-09-04 DIAGNOSIS — C119 Malignant neoplasm of nasopharynx, unspecified: Secondary | ICD-10-CM | POA: Insufficient documentation

## 2023-09-04 LAB — CBC WITH DIFFERENTIAL (CANCER CENTER ONLY)
Abs Immature Granulocytes: 0.04 10*3/uL (ref 0.00–0.07)
Basophils Absolute: 0 10*3/uL (ref 0.0–0.1)
Basophils Relative: 0 %
Eosinophils Absolute: 0.1 10*3/uL (ref 0.0–0.5)
Eosinophils Relative: 1 %
HCT: 41.7 % (ref 39.0–52.0)
Hemoglobin: 13.3 g/dL (ref 13.0–17.0)
Immature Granulocytes: 1 %
Lymphocytes Relative: 10 %
Lymphs Abs: 0.9 10*3/uL (ref 0.7–4.0)
MCH: 27.1 pg (ref 26.0–34.0)
MCHC: 31.9 g/dL (ref 30.0–36.0)
MCV: 85.1 fL (ref 80.0–100.0)
Monocytes Absolute: 0.4 10*3/uL (ref 0.1–1.0)
Monocytes Relative: 5 %
Neutro Abs: 7.3 10*3/uL (ref 1.7–7.7)
Neutrophils Relative %: 83 %
Platelet Count: 341 10*3/uL (ref 150–400)
RBC: 4.9 MIL/uL (ref 4.22–5.81)
RDW: 15 % (ref 11.5–15.5)
WBC Count: 8.7 10*3/uL (ref 4.0–10.5)
nRBC: 0 % (ref 0.0–0.2)

## 2023-09-04 LAB — CMP (CANCER CENTER ONLY)
ALT: 14 U/L (ref 0–44)
AST: 18 U/L (ref 15–41)
Albumin: 3.6 g/dL (ref 3.5–5.0)
Alkaline Phosphatase: 126 U/L (ref 38–126)
Anion gap: 8 (ref 5–15)
BUN: 11 mg/dL (ref 8–23)
CO2: 27 mmol/L (ref 22–32)
Calcium: 9.3 mg/dL (ref 8.9–10.3)
Chloride: 100 mmol/L (ref 98–111)
Creatinine: 0.83 mg/dL (ref 0.61–1.24)
GFR, Estimated: >60 mL/min (ref >60)
Glucose, Bld: 154 mg/dL — ABNORMAL HIGH (ref 70–99)
Potassium: 4.5 mmol/L (ref 3.5–5.1)
Sodium: 135 mmol/L (ref 135–145)
Total Bilirubin: 0.3 mg/dL (ref 0.3–1.2)
Total Protein: 7.7 g/dL (ref 6.5–8.1)

## 2023-09-04 NOTE — Progress Notes (Signed)
PET 08/28/23.  No appetite, 2-3 boost or ensure a day.

## 2023-09-04 NOTE — Progress Notes (Signed)
[facility] OFFICE PROGRESS NOTE  Patient Care Team: Etheleen Nicks, NP as PCP - General Vernie Murders, MD (Otolaryngology) Katrinka Blazing Jethro Poling, NP (Inactive) as Nurse Practitioner (Hospice and Palliative Medicine) Rosetta Posner, MD as Referring Physician (Geriatric Medicine) Alinda Dooms, NP as Nurse Practitioner (Nurse Practitioner) Dorcas Carrow, RN as Registered Nurse Earna Coder, MD as Consulting Physician (Oncology)  SUMMARY OF ONCOLOGIC HISTORY: Oncology History  Nasopharyngeal cancer [facility])  08/18/2020 Initial Diagnosis   Nasopharyngeal carcinoma (HCC)   08/18/2020 Cancer Staging   Staging form: Pharynx - Nasopharynx, AJCC 8th Edition - Clinical stage from 08/18/2020: Stage III (cT3, cN0, cM0) - Signed by Rosey Bath, MD on 02/17/2021 Histopathologic type: Squamous cell carcinoma, keratinizing, NOS Stage prefix: Initial diagnosis ECOG performance status: Grade 1 Stage used in treatment planning: Yes National guidelines used in treatment planning: Yes Type of national guideline used in treatment planning: NCCN   08/24/2020 - 10/20/2020 Chemotherapy         12/31/2020 -  Chemotherapy   Patient is on Treatment Plan : HEAD/NECK NASOPHARYNGEAL ADJUVANT Cisplatin D1 + 5FU IVCI D1-5 q28d      INTERVAL HISTORY: in wheel chair; with daughter.   77 year old male patient with a history of recent stroke/debility weight loss ; and recurrence of nasopharyngeal cancer noted on recent April 2024 PET scan -currently status post palliative radiation is here for follow-up; he is here today with results of the PET scan.  In the interim patient noted to have a episode of pneumonia s/p treatment antibiotics recently.  Continues to have intermittent coughing.  No fever no chills.  As per the family patient has had difficulty with memory.  No falls.  Patient lives at home with a caregiver.  Lost weight.  Chronic mild constipation.  Not any worse.     Review of Systems  Constitutional:  Positive for malaise/fatigue and weight loss. Negative for chills, diaphoresis and fever.  HENT:  Positive for hearing loss. Negative for nosebleeds and sore throat.   Eyes:  Negative for double vision.  Respiratory:  Negative for cough, hemoptysis, sputum production, shortness of breath and wheezing.   Cardiovascular:  Negative for chest pain, palpitations, orthopnea and leg swelling.  Gastrointestinal:  Positive for nausea. Negative for abdominal pain, blood in stool, constipation, diarrhea, heartburn, melena and vomiting.  Genitourinary:  Negative for dysuria, frequency and urgency.  Musculoskeletal:  Positive for back pain and joint pain.  Skin: Negative.  Negative for itching and rash.  Neurological:  Negative for dizziness, tingling, focal weakness, weakness and headaches.  Endo/Heme/Allergies:  Does not bruise/bleed easily.  Psychiatric/Behavioral:  Negative for depression. The patient is not nervous/anxious and does not have insomnia.      ALLERGIES:  is allergic to propoxyphene.  MEDICATIONS:  Current Outpatient Medications  Medication Sig Dispense Refill   acetaminophen (TYLENOL) 325 MG tablet Take 975 mg by mouth every 6 (six) hours as needed.     ADVAIR HFA 115-21 MCG/ACT inhaler Inhale 1 puff into the lungs 2 (two) times daily.     albuterol (PROVENTIL) (2.5 MG/3ML) 0.083% nebulizer solution      Albuterol Sulfate, sensor, 108 (90 Base) MCG/ACT AEPB Inhale 1-2 puffs into the lungs every 6 (six) hours as needed (Wheezing or Shortness of breath).     aspirin EC 81 MG tablet Take 81 mg by mouth daily. Swallow whole.     atorvastatin (LIPITOR) 80 MG tablet Take 1 tablet (80 mg total) by mouth daily.  Cholecalciferol (QC VITAMIN D3) 50 MCG (2000 UT) CAPS Take 2,000 Units by mouth daily.     clopidogrel (PLAVIX) 75 MG tablet Take 75 mg by mouth every morning.     clotrimazole (MYCELEX) 10 MG troche      cyproheptadine (PERIACTIN) 4 MG  tablet Take 1 tablet by mouth daily.     DULoxetine (CYMBALTA) 30 MG capsule Take 30 mg by mouth daily.     famotidine (PEPCID) 40 MG tablet Take 40 mg by mouth daily.     feeding supplement (ENSURE ENLIVE / ENSURE PLUS) LIQD Take 237 mLs by mouth 3 (three) times daily between meals. 237 mL 12   folic acid (FOLVITE) 1 MG tablet Take 1 mg by mouth daily.     glipiZIDE (GLUCOTROL) 5 MG tablet Take 1 tablet by mouth daily.     linaclotide (LINZESS) 72 MCG capsule Hold while in nursing facility.     Multiple Vitamin (MULTIVITAMIN WITH MINERALS) TABS tablet Take 1 tablet by mouth daily.     nicotine (NICODERM CQ - DOSED IN MG/24 HOURS) 14 mg/24hr patch Place 1 patch (14 mg total) onto the skin daily. 28 patch 0   ondansetron (ZOFRAN-ODT) 4 MG disintegrating tablet Take 2 tablets (8 mg total) by mouth every 8 (eight) hours as needed. 40 tablet 1   oxyCODONE (OXY IR/ROXICODONE) 5 MG immediate release tablet Take 1 tablet (5 mg total) by mouth 2 (two) times daily as needed. Home med. 20 tablet 0   pantoprazole (PROTONIX) 40 MG tablet Take 40 mg by mouth daily.     pilocarpine (SALAGEN) 5 MG tablet Take 5 mg by mouth 3 (three) times daily.     polyethylene glycol powder (GLYCOLAX/MIRALAX) 17 GM/SCOOP powder Take 17 g by mouth in the morning and at bedtime.  0   polyvinyl alcohol (LIQUIFILM TEARS) 1.4 % ophthalmic solution Apply to eye.     prochlorperazine (COMPAZINE) 10 MG tablet Take 1 tablet (10 mg total) by mouth every 6 (six) hours as needed (Nausea or vomiting). 60 tablet 1   promethazine (PHENERGAN) 12.5 MG tablet Take by mouth.     sodium chloride (OCEAN) 0.65 % nasal spray Place into the nose.     SODIUM FLUORIDE 5000 PPM 1.1 % GEL dental gel Take by mouth 3 (three) times daily.     sulfamethoxazole-trimethoprim (BACTRIM DS) 800-160 MG tablet Take 1 tablet by mouth 2 (two) times daily.     No current facility-administered medications for this visit.   Facility-Administered Medications Ordered  in Other Visits  Medication Dose Route Frequency Provider Last Rate Last Admin   sodium chloride flush (NS) 0.9 % injection 10 mL  10 mL Intravenous PRN Nelva Nay C, MD   10 mL at 12/03/20 1138    PHYSICAL EXAMINATION:   Vitals:   09/04/23 1510  BP: 124/84  Pulse: 97  Temp: 98 F (36.7 C)  SpO2: 97%     Filed Weights   09/04/23 1510  Weight: 121 lb (54.9 kg)     Physical Exam Vitals and nursing note reviewed.  HENT:     Head: Normocephalic and atraumatic.     Mouth/Throat:     Pharynx: Oropharynx is clear.  Eyes:     Extraocular Movements: Extraocular movements intact.     Pupils: Pupils are equal, round, and reactive to light.  Cardiovascular:     Rate and Rhythm: Normal rate and regular rhythm.  Pulmonary:     Comments: Decreased breath sounds bilaterally.  Abdominal:     Palpations: Abdomen is soft.  Musculoskeletal:        General: Normal range of motion.     Cervical back: Normal range of motion.  Skin:    General: Skin is warm.  Neurological:     General: No focal deficit present.     Mental Status: He is alert and oriented to person, place, and time.  Psychiatric:        Behavior: Behavior normal.        Judgment: Judgment normal.      LABORATORY DATA:  I have reviewed the data as listed    Component Value Date/Time   NA 135 09/04/2023 1500   NA 137 12/15/2015 1056   K 4.5 09/04/2023 1500   CL 100 09/04/2023 1500   CO2 27 09/04/2023 1500   GLUCOSE 154 (H) 09/04/2023 1500   BUN 11 09/04/2023 1500   BUN 8 12/15/2015 1056   CREATININE 0.83 09/04/2023 1500   CREATININE 1.07 06/16/2016 1632   CALCIUM 9.3 09/04/2023 1500   PROT 7.7 09/04/2023 1500   PROT 6.5 12/15/2015 1056   ALBUMIN 3.6 09/04/2023 1500   ALBUMIN 4.2 12/15/2015 1056   AST 18 09/04/2023 1500   ALT 14 09/04/2023 1500   ALKPHOS 126 09/04/2023 1500   BILITOT 0.3 09/04/2023 1500   GFRNONAA >60 09/04/2023 1500   GFRNONAA 71 06/16/2016 1632   GFRAA >60 08/18/2020 1129    GFRAA 82 06/16/2016 1632    No results found for: "SPEP", "UPEP"  Lab Results  Component Value Date   WBC 8.7 09/04/2023   NEUTROABS 7.3 09/04/2023   HGB 13.3 09/04/2023   HCT 41.7 09/04/2023   MCV 85.1 09/04/2023   PLT 341 09/04/2023      Chemistry      Component Value Date/Time   NA 135 09/04/2023 1500   NA 137 12/15/2015 1056   K 4.5 09/04/2023 1500   CL 100 09/04/2023 1500   CO2 27 09/04/2023 1500   BUN 11 09/04/2023 1500   BUN 8 12/15/2015 1056   CREATININE 0.83 09/04/2023 1500   CREATININE 1.07 06/16/2016 1632      Component Value Date/Time   CALCIUM 9.3 09/04/2023 1500   ALKPHOS 126 09/04/2023 1500   AST 18 09/04/2023 1500   ALT 14 09/04/2023 1500   BILITOT 0.3 09/04/2023 1500       No results found for: "CA125", "CEA", "PSA", "CA199", "CA2729", "AFP"   RADIOGRAPHIC STUDIES: I have personally reviewed the radiological images as listed and agreed with the findings in the report. No results found.   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 S/p 5 weeks of concurrent cisplatin and radiation [shortened x 3 fractions d/t mucositis].  Patient declined adjuvant chemotherapy.  Completed 10/24/20. [Dr.Juengel].  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 No findings suspicious for metastatic disease. Currently undergoing salvage RT until 07/10- 2024.    # PET scan OCT 9th, 2024- Improving right sphenoid sinus/clival mass, with equivocal residual in metabolism.  HOLD off any systemic therapy at this time. If progressive disease noted- would recommend Immunotherapy. Patient currently awaiting CT scan with rad in dec 2024. Discussed with Dr.Chrystal.    # OCT 2024- PET incidental- Suspected post infectious/inflammatory scarring in the posterior right lower lobe; episode of pneumonia s/p treatment  antibiotics recently.  Monitor for now.    # Nausea-  sect to RT vs other- continue zofran ODT;/Compazine. Stable.   # Constipation- continue supportive care with MiraLAX Dulcolax etc.  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  S/p evaluation with Per Neuro-improving. Stable.   # DISPOSITION:  # follow up in  3 months- MD: labs- cbc/cmp- Dr.B  # I reviewed the blood work- with the patient in detail; also reviewed the imaging independently [as summarized above]; and with the patient in detail.    Orders Placed This Encounter  Procedures   CBC with Differential (Cancer Center Only)    Standing Status:   Future    Standing Expiration Date:   09/03/2024   CMP (Cancer Center only)    Standing Status:   Future    Standing Expiration Date:   09/03/2024   All questions were answered. The patient knows to call the clinic with any problems, questions or concerns. No barriers to learning was detected.    Earna Coder, MD 09/04/2023 3:37 PM

## 2023-09-04 NOTE — Assessment & Plan Note (Addendum)
#   T3NxMx/stage 3 nasopharyngeal carcinoma- unresectable. S/p 5 weeks of concurrent cisplatin and radiation [shortened x 3 fractions d/t mucositis].  Patient declined adjuvant chemotherapy.  Completed 10/24/20. [Dr.Juengel].  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 No findings suspicious for metastatic disease. Currently undergoing salvage RT until 07/10- 2024.    # PET scan OCT 9th, 2024- Improving right sphenoid sinus/clival mass, with equivocal residual in metabolism.  HOLD off any systemic therapy at this time. If progressive disease noted- would recommend Immunotherapy. Patient currently awaiting CT scan with rad in dec 2024. Discussed with Dr.Chrystal.    # OCT 2024- PET incidental- Suspected post infectious/inflammatory scarring in the posterior right lower lobe; episode of pneumonia s/p treatment antibiotics recently.  Monitor for now.    # Nausea- sect to RT vs other- continue zofran ODT;/Compazine. Stable.   # Constipation- continue supportive care with MiraLAX Dulcolax etc.  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  S/p evaluation with Per Neuro-improving. Stable.   # DISPOSITION:  # follow up in  3 months- MD: labs- cbc/cmp- Dr.B  # I reviewed the blood work- with the patient in detail; also reviewed the imaging independently [as summarized above]; and with the patient in detail.

## 2023-09-05 ENCOUNTER — Other Ambulatory Visit: Payer: Self-pay

## 2023-09-29 ENCOUNTER — Telehealth: Payer: Self-pay | Admitting: Internal Medicine

## 2023-09-29 NOTE — Telephone Encounter (Signed)
Patients daughter in law called to cancel appointments- he has had severe decline and has been admitted to Hospice.  Dr. B and CT appointments have been cancelled

## 2023-10-29 DEATH — deceased

## 2023-11-09 ENCOUNTER — Ambulatory Visit: Payer: Medicare Other

## 2023-11-20 ENCOUNTER — Ambulatory Visit: Payer: Medicare Other | Admitting: Radiation Oncology

## 2023-11-28 ENCOUNTER — Ambulatory Visit: Payer: Medicare Other | Admitting: Radiation Oncology

## 2023-12-03 ENCOUNTER — Other Ambulatory Visit: Payer: Self-pay

## 2023-12-05 ENCOUNTER — Ambulatory Visit: Payer: Medicare Other | Admitting: Internal Medicine

## 2023-12-05 ENCOUNTER — Other Ambulatory Visit: Payer: Medicare Other
